# Patient Record
Sex: Male | Born: 1965
Health system: Southern US, Community
[De-identification: ages and names within clinical notes are randomized; demographics above are authoritative.]

## PROBLEM LIST (undated history)

## (undated) DIAGNOSIS — K648 Other hemorrhoids: Secondary | ICD-10-CM

## (undated) DIAGNOSIS — R569 Unspecified convulsions: Secondary | ICD-10-CM

## (undated) DIAGNOSIS — I10 Essential (primary) hypertension: Secondary | ICD-10-CM

## (undated) DIAGNOSIS — I219 Acute myocardial infarction, unspecified: Secondary | ICD-10-CM

## (undated) DIAGNOSIS — Z8619 Personal history of other infectious and parasitic diseases: Secondary | ICD-10-CM

## (undated) DIAGNOSIS — C966 Unifocal Langerhans-cell histiocytosis: Secondary | ICD-10-CM

## (undated) DIAGNOSIS — I255 Ischemic cardiomyopathy: Secondary | ICD-10-CM

## (undated) DIAGNOSIS — K529 Noninfective gastroenteritis and colitis, unspecified: Secondary | ICD-10-CM

## (undated) DIAGNOSIS — E785 Hyperlipidemia, unspecified: Secondary | ICD-10-CM

## (undated) DIAGNOSIS — K5090 Crohn's disease, unspecified, without complications: Secondary | ICD-10-CM

## (undated) DIAGNOSIS — N281 Cyst of kidney, acquired: Secondary | ICD-10-CM

## (undated) DIAGNOSIS — I2510 Atherosclerotic heart disease of native coronary artery without angina pectoris: Secondary | ICD-10-CM

## (undated) DIAGNOSIS — I236 Thrombosis of atrium, auricular appendage, and ventricle as current complications following acute myocardial infarction: Secondary | ICD-10-CM

## (undated) DIAGNOSIS — T7840XA Allergy, unspecified, initial encounter: Secondary | ICD-10-CM

## (undated) DIAGNOSIS — I5022 Chronic systolic (congestive) heart failure: Secondary | ICD-10-CM

## (undated) DIAGNOSIS — M810 Age-related osteoporosis without current pathological fracture: Secondary | ICD-10-CM

## (undated) DIAGNOSIS — Z87898 Personal history of other specified conditions: Secondary | ICD-10-CM

## (undated) DIAGNOSIS — N400 Enlarged prostate without lower urinary tract symptoms: Secondary | ICD-10-CM

## (undated) DIAGNOSIS — K5790 Diverticulosis of intestine, part unspecified, without perforation or abscess without bleeding: Secondary | ICD-10-CM

## (undated) DIAGNOSIS — Z8719 Personal history of other diseases of the digestive system: Secondary | ICD-10-CM

## (undated) DIAGNOSIS — K635 Polyp of colon: Secondary | ICD-10-CM

## (undated) DIAGNOSIS — K922 Gastrointestinal hemorrhage, unspecified: Secondary | ICD-10-CM

## (undated) DIAGNOSIS — K219 Gastro-esophageal reflux disease without esophagitis: Secondary | ICD-10-CM

## (undated) DIAGNOSIS — G5620 Lesion of ulnar nerve, unspecified upper limb: Secondary | ICD-10-CM

## (undated) DIAGNOSIS — D3610 Benign neoplasm of peripheral nerves and autonomic nervous system, unspecified: Secondary | ICD-10-CM

## (undated) HISTORY — DX: Hyperlipidemia, unspecified: E78.5

## (undated) HISTORY — PX: CARDIAC CATHETERIZATION: SHX172

## (undated) HISTORY — DX: Age-related osteoporosis without current pathological fracture: M81.0

## (undated) HISTORY — DX: Personal history of other infectious and parasitic diseases: Z86.19

## (undated) HISTORY — DX: Essential (primary) hypertension: I10

## (undated) HISTORY — DX: Thrombosis of atrium, auricular appendage, and ventricle as current complications following acute myocardial infarction: I23.6

## (undated) HISTORY — DX: Crohn's disease, unspecified, without complications: K50.90

## (undated) HISTORY — DX: Ischemic cardiomyopathy: I25.5

## (undated) HISTORY — DX: Polyp of colon: K63.5

## (undated) HISTORY — DX: Gastro-esophageal reflux disease without esophagitis: K21.9

## (undated) HISTORY — DX: Benign prostatic hyperplasia without lower urinary tract symptoms: N40.0

## (undated) HISTORY — DX: Gastrointestinal hemorrhage, unspecified: K92.2

## (undated) HISTORY — DX: Diverticulosis of intestine, part unspecified, without perforation or abscess without bleeding: K57.90

## (undated) HISTORY — DX: Chronic systolic (congestive) heart failure: I50.22

## (undated) HISTORY — DX: Personal history of other specified conditions: Z87.898

## (undated) HISTORY — DX: Personal history of other diseases of the digestive system: Z87.19

## (undated) HISTORY — PX: UPPER GASTROINTESTINAL ENDOSCOPY: SHX188

## (undated) HISTORY — DX: Lesion of ulnar nerve, unspecified upper limb: G56.20

## (undated) HISTORY — DX: Other hemorrhoids: K64.8

## (undated) HISTORY — DX: Unifocal Langerhans-cell histiocytosis: C96.6

## (undated) HISTORY — DX: Allergy, unspecified, initial encounter: T78.40XA

## (undated) HISTORY — DX: Unspecified convulsions: R56.9

## (undated) HISTORY — DX: Atherosclerotic heart disease of native coronary artery without angina pectoris: I25.10

## (undated) HISTORY — PX: OTHER SURGICAL HISTORY: SHX169

## (undated) HISTORY — DX: Benign neoplasm of peripheral nerves and autonomic nervous system, unspecified: D36.10

## (undated) HISTORY — DX: Noninfective gastroenteritis and colitis, unspecified: K52.9

---
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
## 1990-03-31 DIAGNOSIS — K5090 Crohn's disease, unspecified, without complications: Secondary | ICD-10-CM

## 1990-03-31 HISTORY — DX: Crohn's disease, unspecified, without complications: K50.90

## 1993-03-31 DIAGNOSIS — Z87898 Personal history of other specified conditions: Secondary | ICD-10-CM

## 1993-03-31 HISTORY — DX: Personal history of other specified conditions: Z87.898

## 1994-03-31 HISTORY — PX: TONSILLECTOMY: SUR1361

## 1998-03-31 HISTORY — PX: APPENDECTOMY: SHX54

## 1998-03-31 HISTORY — PX: CHOLECYSTECTOMY: SHX55

## 2005-03-31 DIAGNOSIS — D3610 Benign neoplasm of peripheral nerves and autonomic nervous system, unspecified: Secondary | ICD-10-CM

## 2005-03-31 HISTORY — PX: TUMOR REMOVAL: SHX12

## 2005-03-31 HISTORY — DX: Benign neoplasm of peripheral nerves and autonomic nervous system, unspecified: D36.10

## 2009-03-31 HISTORY — PX: BACK SURGERY: SHX140

## 2009-03-31 HISTORY — PX: LUMBAR SPINE SURGERY: SHX701

## 2010-03-31 HISTORY — PX: SPINAL CORD STIMULATOR IMPLANT: SHX2422

## 2014-07-30 HISTORY — PX: ESOPHAGOGASTRODUODENOSCOPY: SHX1529

## 2014-08-30 HISTORY — PX: COLONOSCOPY: SHX174

## 2015-01-30 HISTORY — PX: CARDIOVASCULAR STRESS TEST: SHX262

## 2015-04-01 HISTORY — PX: INGUINAL HERNIA REPAIR: SUR1180

## 2015-11-30 DIAGNOSIS — M810 Age-related osteoporosis without current pathological fracture: Secondary | ICD-10-CM | POA: Insufficient documentation

## 2015-11-30 HISTORY — DX: Age-related osteoporosis without current pathological fracture: M81.0

## 2015-12-19 ENCOUNTER — Ambulatory Visit (INDEPENDENT_AMBULATORY_CARE_PROVIDER_SITE_OTHER): Payer: BLUE CROSS/BLUE SHIELD | Admitting: Family Medicine

## 2015-12-19 ENCOUNTER — Encounter: Payer: Self-pay | Admitting: Family Medicine

## 2015-12-19 VITALS — BP 130/82 | HR 60 | Temp 97.8°F | Ht 66.75 in | Wt 155.2 lb

## 2015-12-19 DIAGNOSIS — D3610 Benign neoplasm of peripheral nerves and autonomic nervous system, unspecified: Secondary | ICD-10-CM

## 2015-12-19 DIAGNOSIS — D721 Eosinophilia: Secondary | ICD-10-CM

## 2015-12-19 DIAGNOSIS — N400 Enlarged prostate without lower urinary tract symptoms: Secondary | ICD-10-CM

## 2015-12-19 DIAGNOSIS — D72119 Hypereosinophilic syndrome (hes), unspecified: Secondary | ICD-10-CM

## 2015-12-19 DIAGNOSIS — I10 Essential (primary) hypertension: Secondary | ICD-10-CM | POA: Insufficient documentation

## 2015-12-19 DIAGNOSIS — K50918 Crohn's disease, unspecified, with other complication: Secondary | ICD-10-CM | POA: Diagnosis not present

## 2015-12-19 DIAGNOSIS — S161XXA Strain of muscle, fascia and tendon at neck level, initial encounter: Secondary | ICD-10-CM

## 2015-12-19 MED ORDER — AMLODIPINE BESYLATE 5 MG PO TABS: 5.0000 mg | ORAL_TABLET | Freq: Every day | ORAL | 3 refills | Status: DC

## 2015-12-19 MED ORDER — TAMSULOSIN HCL 0.4 MG PO CAPS: 0.4000 mg | ORAL_CAPSULE | Freq: Every day | ORAL | 3 refills | Status: DC

## 2015-12-19 MED ORDER — AMLODIPINE BESYLATE 5 MG PO TABS: 5.0000 mg | ORAL_TABLET | Freq: Every day | ORAL | 1 refills | Status: DC

## 2015-12-19 MED ORDER — TAMSULOSIN HCL 0.4 MG PO CAPS: 0.4000 mg | ORAL_CAPSULE | Freq: Every day | ORAL | 1 refills | Status: DC

## 2015-12-19 MED ORDER — TADALAFIL 5 MG PO TABS: 5.0000 mg | ORAL_TABLET | Freq: Every day | ORAL | 3 refills | Status: DC | PRN

## 2015-12-19 MED ORDER — HYDROXYZINE PAMOATE 100 MG PO CAPS: 100.0000 mg | ORAL_CAPSULE | Freq: Every day | ORAL | 3 refills | Status: DC

## 2015-12-19 MED ORDER — HYDROXYZINE PAMOATE 100 MG PO CAPS: 100.0000 mg | ORAL_CAPSULE | Freq: Every day | ORAL | 1 refills | Status: DC

## 2015-12-19 MED ORDER — METHOCARBAMOL 500 MG PO TABS: 500.0000 mg | ORAL_TABLET | Freq: Two times a day (BID) | ORAL | 0 refills | Status: DC | PRN

## 2015-12-19 NOTE — Progress Notes (Signed)
Pre visit review using our clinic review tool, if applicable. No additional management support is needed unless otherwise documented below in the visit note. 

## 2015-12-19 NOTE — Progress Notes (Signed)
BP 130/82 (BP Location: Left Arm, Patient Position: Sitting, Cuff Size: Normal)   Pulse 60   Temp 97.8 F (36.6 C) (Oral)   Ht 5' 6.75" (1.695 m)   Wt 155 lb 4 oz (70.4 kg)   SpO2 97%   BMI 24.50 kg/m    CC: new pt to establish care Subjective:    Patient ID: Jack [name], male    DOB: [birth year], 50 y.o.   MRN: [id number]  HPI: Jack [name] is a 50 y.o. male presenting on 12/19/2015 for Establish Care   New pt to establish. Here with wife Prior saw Dr Dory Larsen PCP in [place].   Crohn's disease pancolitis - controlled on mercaptopurine 69m daily and prednisone 157mdaily. Takes entyvio every 7 wks. [facility] Wants to stay with GI. Humira and remicade have lost effect. Last crohn's flare 01/2015.   H/o crohn's related polyarthritis and pneumonitis s/p bronchoscopy - started on asmanex (pulmonologist Dr MeMurvin Nataln CaNegley Last pneumonia 01/2015. Has had ICU stays for respiratory problems. Oxycodone PRN, rarely takes. More after recent inguinal hernia surgery.  DEXA - done yesterday  BPH - on flomax daily.   Neck pain over last few days - attributed to poor sleep.  H/o schwannoma removal from L axilla with resultant ulnar nerve ligation s/p repair.   Relevant past medical, surgical, family and social history reviewed and updated as indicated. Interim medical history since our last visit reviewed. Allergies and medications reviewed and updated. No current outpatient prescriptions on file prior to visit.   No current facility-administered medications on file prior to visit.     Review of Systems Per HPI unless specifically indicated in ROS section     Objective:    BP 130/82 (BP Location: Left Arm, Patient Position: Sitting, Cuff Size: Normal)   Pulse 60   Temp 97.8 F (36.6 C) (Oral)   Ht 5' 6.75" (1.695 m)   Wt 155 lb 4 oz (70.4 kg)   SpO2 97%   BMI 24.50 kg/m   Wt Readings from Last 3 Encounters:  12/19/15  155 lb 4 oz (70.4 kg)    Physical Exam  Constitutional: He appears well-developed and well-nourished. No distress.  HENT:  Mouth/Throat: Oropharynx is clear and moist. No oropharyngeal exudate.  Eyes: Conjunctivae are normal. Pupils are equal, round, and reactive to light.  Neck: Normal range of motion. Neck supple. No thyromegaly present.  Cardiovascular: Normal rate, regular rhythm, normal heart sounds and intact distal pulses.   No murmur heard. Pulmonary/Chest: Effort normal and breath sounds normal. No respiratory distress. He has no wheezes. He has no rales.  Abdominal: Soft. Bowel sounds are normal. He exhibits no distension and no mass. There is no tenderness. There is no rebound and no guarding.  Musculoskeletal: He exhibits no edema.  FROM at neck.  Tender to palpation R trapezius  Lymphadenopathy:    He has no cervical adenopathy.  Skin: Skin is warm and dry. No rash noted.  Psychiatric: He has a normal mood and affect.  Nursing note and vitals reviewed.  No results found for this or any previous visit.    Assessment & Plan:  Over 45 minutes were spent face-to-face with the patient during this encounter and >50% of that time was spent on counseling and coordination of care. Records from prior PCP and GI requested today.  Problem List Items Addressed This Visit    BPH (benign prostatic hypertrophy)    BPH  with LUTS, stable on flomax daily. Continue.       Relevant Medications   tamsulosin (FLOMAX) 0.4 MG CAPS capsule   Crohn disease (HCC)    On entyvio, mercaptopurine, and prednisone currently on 74m daily.  Appreciate GI care - records requested.  With pulmonary complications - see below. I will refer to pulm locally to establish.  Pt will continue current regimen otherwise.       Relevant Orders   Ambulatory referral to Pulmonology   Essential hypertension - Primary    Chronic, stable. Continue current regimen.       Relevant Medications   amLODipine  (NORVASC) 5 MG tablet   tadalafil (CIALIS) 5 MG tablet   Hypereosinophilic syndrome    Per pulm - will refer to local pulm to establish care. Prednisone dependent. asmanex hasn't really helped.       Relevant Medications   predniSONE (DELTASONE) 1 MG tablet   predniSONE (DELTASONE) 5 MG tablet   mercaptopurine (PURINETHOL) 50 MG tablet   Other Relevant Orders   Ambulatory referral to Pulmonology   Neck strain    Treat with muscle relaxant sent to pharmacy, heating pad, gentle stretching.      Schwannoma    Other Visit Diagnoses   None.      Follow up plan: Return in about 3 months (around 03/19/2016) for annual exam, prior fasting for blood work.  JRia Bush MD

## 2015-12-19 NOTE — Patient Instructions (Addendum)
meds refilled today. cialis coupon provided today. Try robaxin muscle relaxant for neck, do gentle stretches, heating pad to neck.  Good to meet you. Return as needed or in 3 months for physical.

## 2015-12-20 DIAGNOSIS — D721 Eosinophilia: Secondary | ICD-10-CM

## 2015-12-20 DIAGNOSIS — D72119 Hypereosinophilic syndrome (hes), unspecified: Secondary | ICD-10-CM | POA: Insufficient documentation

## 2015-12-20 DIAGNOSIS — S161XXA Strain of muscle, fascia and tendon at neck level, initial encounter: Secondary | ICD-10-CM | POA: Insufficient documentation

## 2015-12-20 NOTE — Assessment & Plan Note (Signed)
Chronic, stable. Continue current regimen. 

## 2015-12-20 NOTE — Assessment & Plan Note (Signed)
On entyvio, mercaptopurine, and prednisone currently on 90m daily.  Appreciate GI care - records requested.  With pulmonary complications - see below. I will refer to pulm locally to establish.  Pt will continue current regimen otherwise.

## 2015-12-20 NOTE — Assessment & Plan Note (Signed)
BPH with LUTS, stable on flomax daily. Continue.

## 2015-12-20 NOTE — Assessment & Plan Note (Signed)
Per pulm - will refer to local pulm to establish care. Prednisone dependent. asmanex hasn't really helped.

## 2015-12-20 NOTE — Assessment & Plan Note (Signed)
Treat with muscle relaxant sent to pharmacy, heating pad, gentle stretching.

## 2016-01-01 NOTE — Progress Notes (Signed)
[facility] Consultation      Assessment and Plan:  Chronic eosinophilic pneumonia.  -Will order CXR.  -Patient has been stuck on 15 mg of prednisone for several years, this has been difficult to wean down, he cannot seem to go down below this level.  Acute on chronic dyspnea with acute bronchitis.   -Will increase steroids back to 15 mg daily.  --Notice increasing symptoms and decreased the prednisone, he is currently on a burst, and we will decrease this gradually down to 15 mg daily once again.  Severe persistent asthma.  --Continue inhaled steroid. This was changed to a trial of Symbicort to 2 puffs twice daily, asked to rinse mouth out after use. -We will do a trial of this and see if it helps with his breathing, so he will call us for a prescription.  Allergic asthma with multiple food allergies.  --Numerous allergies with allergic rash noted today --Asked family to to track down allergy testing results done in the past.  --In the meantime will order CBC and RAST to be done once he is down to his baseline level of prednisone. --Will prescribe epinephrine auto-injector.  --Pneumovax once is doing better.   Chrohn's disease.  --Currenlty on Entyvio. Has been on humira and remicaide but did not help.  --Managed by [facility] gastroenterologist in [place].   Date: 01/01/2016  MRN# [id number] Jack [name] 20-Apr-1965  Referring Physician:   SHOGO LARKEY is a 50 y.o. old male seen in consultation for chief complaint of:    Chief Complaint  Patient presents with  . PULMONARY CONSULT    per Dr. Garlon Hatchet. pt states he had hx of recurrent PNA. pt currently has non prod cough, increased sob, temp of 101 last night, chills, sweats & chest discomfort X1d.    HPI:   The patient is a 50 yo male with a history of Crohn's disease pancolitis - controlled on mercaptopurine 41m daily and prednisone 179mdaily. Takes entyvio every 7 wks. [place] [facility] [place] Wants to stay with GI. Humira and remicade have lost effect. Last crohn's flare 01/2015. Notes that his crohn's disease predominantly affects his lungs more than intestine.   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   He has been maintained on 15 mg daily and has trouble going below this, when he tries to go to 14 he develops feeling of dyspnea, pulmonary congestion, he has severe itching, and takes hydroxyzine, he has seen an allergist was "super-allergic" to everything.   On review of the patient's history; first seen by Dr HaNevada Cranet [place] in 02/2013 for recurrent multifocal pneumonitis thought to be eosinophilic pneumonia vs other inflammatory pneumonitis, including cryptogenic organizing pneumonia.  Bronchoscopy negative in the past, no evidence of eosinophilia on BAL.  Prednisone tapering attempted on multiple occasions with Dr HaNevada Cranend he would have recurrent respiratory symptoms.  he has pneumonitis s/p bronchoscopy - started on asmanex (pulmonologist Dr MeMurvin Nataln CaSerena Last pneumonia 01/2015. Has had ICU stays for respiratory problems. Bilateral pneumonitis thought to be secondary to eosinophilic pneumonia on chronic prednisone therapy--SOB and cough related to possible hypereosinophilic syndrome.  Pulmonary Function Tests: Date: FVC (% Pred) FEV1 (% Pred) FEV1/FVC TLC RV DLCO Impression  02/08/15 4.46 or 96.4% 3.19 or 87.9% 72 or 91.4% 6.08 or 96% 1.61 or 89.4% 22.3 or 74% Normal PFTs   Pertinent Laboratory Data:  Overall appears consistent with hypereosinophilic syndrome.  ANA was 1:160 C3 was low at 83 with a normal C4 and ESR.  His IgE level was 2550 in 11/10 and 3788 on 12/17/10  Approximately the following year, he did have a bone marrow biopsy performed at [facility] and he reports that the results were normal. Flow cytometry was sent and reportedly noted an overproduction  of eosinophils (46%) but with normal appearing other cell lines.    Pertinent Imaging Data: Overall appears consistent with resolved abnormality in 2016 CT imaging from hospitalization at [facility] in September 2016 1. Negative CT pulmonary angiogram. 2. There is no acute cardiopulmonary disease suspected. 3. Interval resolution of bilateral airspace consolidations seen previously. Small hypodense lesions seen in the liver measuring up to 1.3 cm are likely benign hepatic cysts or hemangiomas.       PMHX:   Past Medical History:  Diagnosis Date  . BPH (benign prostatic hypertrophy)   . Crohn disease ([place]) 1992  . Essential hypertension   . History of chicken pox   . History of gastroesophageal reflux (GERD)   . History of seizure 1995   grand mal x1, completed 6 yrs seizure medication. no seizures since  . Schwannoma 2007   L axilla s/p surgery   Surgical Hx:  Past Surgical History:  Procedure Laterality Date  . APPENDECTOMY  2000  . CHOLECYSTECTOMY  2000  . COLONOSCOPY  01/2015  . extremity surgery Left    ulnar nerve repair after schwannoma removal  . INGUINAL HERNIA REPAIR Bilateral 2017  . LUMBAR SPINE SURGERY  2011  . SPINAL CORD STIMULATOR IMPLANT  2012   x2 (Dr Berton Mount)  . TONSILLECTOMY  1996  . TUMOR REMOVAL Left 2007   schwannoma from L armpit   Family Hx:  Family History  Problem Relation Age of Onset  . CAD Mother     CABG  . Congenital heart disease Mother   . Hypertension Mother   . Hyperlipidemia Mother   . CAD Father     CABG  . Hypertension Father   . Hyperlipidemia Father   . Cancer Maternal Grandmother     colon  . Diabetes Neg Hx    Social Hx:   Social History  Substance Use Topics  . Smoking status: Never Smoker  . Smokeless tobacco: Never Used  . Alcohol use Yes     Comment: 1 beer/day   Medication:     Reviewed.  Included vedolizumab Weyman Rodney)  Allergies:  Cephalexin; Duloxetine; and Aspirin  Review of Systems: Gen:  Denies   fever, sweats, chills HEENT: Denies blurred vision, double vision. bleeds, sore throat Cvc:  No dizziness, chest pain. Resp:   Denies cough or sputum production. Gi: Denies swallowing difficulty, stomach pain. Other:  All other systems were reviewed with the patient and were negative other that what is mentioned in the HPI.   Physical Examination:   VS: BP 128/72 (BP Location: Left Arm, Cuff Size: Normal)   Pulse 88   Ht 5' 7"  (1.702 m)   Wt 153 lb (69.4 kg)   SpO2 97%   BMI 23.96 kg/m   General Appearance: No distress .-The patient has a diffuse fine petechial rash on his torso. Neuro:without focal findings,  speech normal,  HEENT: PERRLA, EOM intact.   Pulmonary: normal breath sounds, No wheezing.  CardiovascularNormal S1,S2.  No m/r/g.   Abdomen: Benign, Soft, non-tender. Renal:  No costovertebral tenderness  GU:  No performed at this time. Endoc: No evident  thyromegaly, no signs of acromegaly. Skin:   warm, no rashes, no ecchymosis  Extremities: normal, no cyanosis, clubbing.  Other findings:    LABORATORY PANEL:   CBC No results for input(s): WBC, HGB, HCT, PLT in the last 168 hours. ------------------------------------------------------------------------------------------------------------------  Chemistries  No results for input(s): NA, K, CL, CO2, GLUCOSE, BUN, CREATININE, CALCIUM, MG, AST, ALT, ALKPHOS, BILITOT in the last 168 hours.  Invalid input(s): GFRCGP ------------------------------------------------------------------------------------------------------------------  Cardiac Enzymes No results for input(s): TROPONINI in the last 168 hours. ------------------------------------------------------------  RADIOLOGY:  No results found.     Thank  you for the consultation and for allowing [facility] to assist in the care of your patient. Our recommendations are noted above.  Please contact us if we can be of further  service.   Marda Stalker, MD.  Board Certified in Internal Medicine, Pulmonary Medicine, [place], and Sleep Medicine.  [facility] Office Number: [phone]  Patricia Pesa, M.D.  Vilinda Boehringer, M.D.  Merton Border, M.D  01/01/2016

## 2016-01-02 ENCOUNTER — Ambulatory Visit
Admission: RE | Admit: 2016-01-02 | Discharge: 2016-01-02 | Disposition: A | Discharge status: Home / Self Care | Payer: BLUE CROSS/BLUE SHIELD | Source: Ambulatory Visit | Attending: Internal Medicine | Admitting: Internal Medicine

## 2016-01-02 ENCOUNTER — Encounter: Payer: Self-pay | Admitting: Internal Medicine

## 2016-01-02 ENCOUNTER — Ambulatory Visit (INDEPENDENT_AMBULATORY_CARE_PROVIDER_SITE_OTHER): Payer: BLUE CROSS/BLUE SHIELD | Admitting: Internal Medicine

## 2016-01-02 VITALS — BP 128/72 | HR 88 | Ht 67.0 in | Wt 153.0 lb

## 2016-01-02 DIAGNOSIS — Z8701 Personal history of pneumonia (recurrent): Secondary | ICD-10-CM | POA: Insufficient documentation

## 2016-01-02 DIAGNOSIS — R918 Other nonspecific abnormal finding of lung field: Secondary | ICD-10-CM | POA: Diagnosis not present

## 2016-01-02 DIAGNOSIS — J189 Pneumonia, unspecified organism: Secondary | ICD-10-CM

## 2016-01-02 DIAGNOSIS — J45909 Unspecified asthma, uncomplicated: Secondary | ICD-10-CM

## 2016-01-02 MED ORDER — EPINEPHRINE 0.3 MG/0.3ML IJ SOAJ: 0.3000 mg | Freq: Once | INTRAMUSCULAR | 1 refills | Status: AC

## 2016-01-02 MED ORDER — BUDESONIDE-FORMOTEROL FUMARATE 160-4.5 MCG/ACT IN AERO: 2.0000 | INHALATION_SPRAY | Freq: Two times a day (BID) | RESPIRATORY_TRACT | 0 refills | Status: DC

## 2016-01-02 NOTE — Patient Instructions (Addendum)
--  Will order CXR, 2 view.  --Start symbicort 160 inhaler 2 puffs twice daily, will give sample, if it helps will prescibe. Rinse mouth after each use.   --Epinephrine auto-injector.  --continue prednisone taper to baseline level of 15 daily.   --once down to 15 mg prednisone daily, check CBC with differential and RAST panel.   --Will need to obtain results from previous allergy testing.

## 2016-01-03 ENCOUNTER — Telehealth: Payer: Self-pay | Admitting: Internal Medicine

## 2016-01-03 NOTE — Telephone Encounter (Signed)
Spoke with pharmacy and they state it didn't come through due to an issue with their system. Asked pharmacist to fax it to [phone] and I will get started on it ASAP.

## 2016-01-03 NOTE — Telephone Encounter (Signed)
Pharmacy calling to see the status on the Epipen PA that they sent over for [place] to do Please call back to let them know the status

## 2016-01-04 MED ORDER — EPINEPHRINE 0.3 MG/0.3ML IJ SOAJ: 0.3000 mg | Freq: Once | INTRAMUSCULAR | 1 refills | Status: AC

## 2016-01-04 NOTE — Addendum Note (Signed)
Addended by: Oscar La R on: 01/04/2016 10:50 AM   Modules accepted: Orders

## 2016-01-04 NOTE — Telephone Encounter (Signed)
Went to [facility] and it stated that EPipen needed to be generic and not brand. Called pharmacy and they state it goes through as generic and that they need new rx. New rx sent. Nothing further needed.

## 2016-01-04 NOTE — Telephone Encounter (Signed)
Submitted PA thru [facility] for Epipen. Key: [id number].  PT ID: [id number]  CMM states they will send questions shortly.  [facility] [place] (Phone) [phone] (Fax)

## 2016-01-23 NOTE — Progress Notes (Addendum)
[facility] Consultation      Assessment and Plan:  Chronic eosinophilic pneumonia.  -Patient has been stuck on 15 mg of prednisone for several years, this has been difficult to wean down, he cannot seem to go down below this level.  Acute on chronic dyspnea with acute bronchitis.   -Will increase steroids back to 15 mg daily.  --Notice increasing symptoms and decreased the prednisone, he is currently on a burst, and we will decrease this gradually down to 15 mg daily once again.  Severe persistent asthma.  --Continue inhaled steroid. This was changed to a trial of Symbicort to 2 puffs twice daily, asked to rinse mouth out after use. -We will do a trial of this and see if it helps with his breathing, so he will call us for a prescription.  Allergic asthma with multiple food allergies.  --Numerous allergies with allergic rash noted today --Asked family to to track down allergy testing results done in the past.  --In the meantime will order CBC and RAST to be done once he is down to his baseline level of prednisone. --Will prescribe epinephrine auto-injector.  --Pneumovax once is doing better.   Chrohn's disease.  --Currenlty on Entyvio. Has been on humira and remicaide but did not help.  --Managed by [facility] gastroenterologist in [place].  --Review of Entyvio package insert:  "The most common adverse reactions (reported by =3% of patients treated with ENTYVIO in the UC Trials I and II and CD Trials I and III combined group and =1% higher than in combined placebo group) were nasopharyngitis, headache, arthralgia, nausea, pyrexia, upper respiratory tract infection, fatigue, cough, bronchitis, influenza, back pain, rash, pruritus, sinusitis, oropharyngeal pain"  Date: 01/23/2016  MRN# [id number] ARDELL MAKAREWICZ 02/22/1966  Referring Physician:   Maurine Cane is a 50 y.o. old male seen in consultation for chief complaint of:    Chief Complaint  Patient  presents with  . Follow-up    4wk rov. CXR 01-02-16 pt currently taking 56m prednisone daily. pt states breathing and allergies are doing well.     HPI:   The patient is a 50yo male with a history of Crohn's disease pancolitis Takes entyvio every 7 wks. [place] Last crohn's flare 01/2015. Notes that his crohn's disease predominantly affects his lungs more than intestine.  CXR 01/02/16; mid-zone scarring bilaterally, some bronchiectasis.   At last visit he was noted to have history of possible eosinophilic pneumonia vs. Other; severe allergic asthma. He has been "stuck" on 15 mg of prednisone for several years, experiencing pulmonary exacerbations whenever this was dropped. Symptoms at that time include a feeling of dyspnea, pulmonary congestion, cough, sneezing, and he has severe itching. He is now at 20 mg daily. He had stopped the symbicort because he was not sure if it was doing.   On review of the patient's history; first [place] for recurrent multifocal pneumonitis thought to be eosinophilic pneumonia vs other inflammatory pneumonitis, including cryptogenic organizing pneumonia.  Bronchoscopy 06/09/2012: negative, no evidence of eosinophilia on BAL and he improved on prednisone   Pulmonary Function Tests: FEV1/FVC TLC RV DLCO Impression  02/08/15:  FVC (% Pred) 4.46 or 96.4%  FEV1 (% Pred)  3.19 or 87.9% 72 or 91.4% 6.08 or 96% 1.61 or 89.4% DLCO-- 22.3 or 74% Normal PFTs   Pertinent Laboratory Data: Overall appears consistent with hypereosinophilic syndrome.  ANA was 1:160 C3  was low at 83 with a normal C4 and ESR.  His IgE level was 2550 in 11/10 and 3788 on 12/17/10  Approximately the following year, he did have a bone marrow biopsy performed at [facility] and he reports that the results were normal. Flow cytometry was sent and reportedly noted an overproduction of eosinophils (46%) but with normal  appearing other cell lines.    Pertinent Imaging Data: Overall appears consistent with resolved abnormality in 2016 CT imaging from hospitalization at [facility] in September 2016 1. Negative CT pulmonary angiogram. 2. There is no acute cardiopulmonary disease suspected. 3. Interval resolution of bilateral airspace consolidations seen previously. Small hypodense lesions seen in the liver measuring up to 1.3 cm are likely benign hepatic cysts or hemangiomas.   Medication:   Reviewed.  Include vedolizumab Weyman Rodney)  Allergies:  Cephalexin; Duloxetine; and Aspirin  Review of Systems: Gen:  Denies  fever, sweats, chills HEENT: Denies blurred vision, double vision. bleeds, sore throat Cvc:  No dizziness, chest pain. Resp:   Denies cough or sputum production. Gi: Denies swallowing difficulty, stomach pain. Other:  All other systems were reviewed with the patient and were negative other that what is mentioned in the HPI.   Physical Examination:   VS: BP 126/68 (BP Location: Left Arm, Cuff Size: Normal)   Pulse 69   Ht _0  (1.702 m)   Wt 162 lb (73.5 kg)   SpO2 93%   BMI 25.37 kg/m   General Appearance: No distress .-The patient has a diffuse fine petechial rash on his torso. Neuro:without focal findings,  speech normal,  HEENT: PERRLA, EOM intact.   Pulmonary: normal breath sounds, No wheezing.  CardiovascularNormal S1,S2.  No m/r/g.   Abdomen: Benign, Soft, non-tender. Renal:  No costovertebral tenderness  GU:  No performed at this time. Endoc: No evident thyromegaly, no signs of acromegaly. Skin:   warm, no rashes, no ecchymosis  Extremities: normal, no cyanosis, clubbing.  Other findings:    LABORATORY PANEL:   CBC No results for input(s): WBC, HGB, HCT, PLT in the last 168 hours. ------------------------------------------------------------------------------------------------------------------  Chemistries  No results for input(s): NA, K, CL, CO2, GLUCOSE, BUN,  CREATININE, CALCIUM, MG, AST, ALT, ALKPHOS, BILITOT in the last 168 hours.  Invalid input(s): GFRCGP ------------------------------------------------------------------------------------------------------------------  Cardiac Enzymes No results for input(s): TROPONINI in the last 168 hours. ------------------------------------------------------------  RADIOLOGY:  No results found.     Thank  you for the consultation and for allowing [facility] to assist in the care of your patient. Our recommendations are noted above.  Please contact us if we can be of further service.   Marda Stalker, MD.  Board Certified in Internal Medicine, Pulmonary Medicine, [place], and Sleep Medicine.  [facility] Office Number: [phone]  Patricia Pesa, M.D.  Vilinda Boehringer, M.D.  Merton Border, M.D  01/23/2016

## 2016-02-01 ENCOUNTER — Ambulatory Visit (INDEPENDENT_AMBULATORY_CARE_PROVIDER_SITE_OTHER): Payer: BLUE CROSS/BLUE SHIELD | Admitting: Internal Medicine

## 2016-02-01 ENCOUNTER — Encounter: Payer: Self-pay | Admitting: Internal Medicine

## 2016-02-01 ENCOUNTER — Other Ambulatory Visit
Admission: RE | Admit: 2016-02-01 | Discharge: 2016-02-01 | Disposition: A | Discharge status: Home / Self Care | Payer: BLUE CROSS/BLUE SHIELD | Source: Ambulatory Visit | Attending: Internal Medicine | Admitting: Internal Medicine

## 2016-02-01 VITALS — BP 126/68 | HR 69 | Ht 67.0 in | Wt 162.0 lb

## 2016-02-01 DIAGNOSIS — J189 Pneumonia, unspecified organism: Secondary | ICD-10-CM | POA: Diagnosis not present

## 2016-02-01 DIAGNOSIS — J82 Pulmonary eosinophilia, not elsewhere classified: Secondary | ICD-10-CM | POA: Diagnosis not present

## 2016-02-01 DIAGNOSIS — J8281 Chronic eosinophilic pneumonia: Secondary | ICD-10-CM

## 2016-02-01 DIAGNOSIS — J45909 Unspecified asthma, uncomplicated: Secondary | ICD-10-CM

## 2016-02-01 MED ORDER — PREDNISONE 10 MG PO TABS: ORAL_TABLET | ORAL | 3 refills | Status: DC

## 2016-02-01 MED ORDER — BUDESONIDE-FORMOTEROL FUMARATE 160-4.5 MCG/ACT IN AERO: 2.0000 | INHALATION_SPRAY | Freq: Two times a day (BID) | RESPIRATORY_TRACT | 3 refills | Status: DC

## 2016-02-01 NOTE — Addendum Note (Signed)
Addended by: Maryanna Shape A on: 02/01/2016 09:55 AM   Modules accepted: Orders

## 2016-02-01 NOTE — Addendum Note (Signed)
Addended by: Maryanna Shape A on: 02/01/2016 10:07 AM   Modules accepted: Orders

## 2016-02-01 NOTE — Addendum Note (Signed)
Addended by: Maryanna Shape A on: 02/01/2016 09:46 AM   Modules accepted: Orders

## 2016-02-01 NOTE — Patient Instructions (Addendum)
--  Will perform CT chest, hi-resolution.   --Prednisone 10 mg tabs 60 tabs for (one months supply), 3 refills.   --serum Immuglobin levels.   --Try NOT to use prednisone bursts due to coughing or sneezing, but rather only for dypsnea.   --Restart symbicort.

## 2016-02-01 NOTE — Addendum Note (Signed)
Addended by: Maryanna Shape A on: 02/01/2016 09:59 AM   Modules accepted: Orders

## 2016-02-01 NOTE — Addendum Note (Signed)
Addended by: Maryanna Shape A on: 02/01/2016 09:49 AM   Modules accepted: Orders

## 2016-02-04 ENCOUNTER — Other Ambulatory Visit
Admission: RE | Admit: 2016-02-04 | Discharge: 2016-02-04 | Disposition: A | Discharge status: Home / Self Care | Payer: BLUE CROSS/BLUE SHIELD | Source: Ambulatory Visit | Attending: Internal Medicine | Admitting: Internal Medicine

## 2016-02-04 DIAGNOSIS — J189 Pneumonia, unspecified organism: Secondary | ICD-10-CM | POA: Insufficient documentation

## 2016-02-04 DIAGNOSIS — J45909 Unspecified asthma, uncomplicated: Secondary | ICD-10-CM | POA: Diagnosis not present

## 2016-02-04 LAB — CBC WITH DIFFERENTIAL/PLATELET
BASOS PCT: 1 %
Basophils Absolute: 0.1 10*3/uL (ref 0–0.1)
Eosinophils Absolute: 0.7 10*3/uL (ref 0–0.7)
Eosinophils Relative: 6 %
HEMATOCRIT: 42.1 % (ref 40.0–52.0)
HEMOGLOBIN: 14 g/dL (ref 13.0–18.0)
LYMPHS ABS: 3 10*3/uL (ref 1.0–3.6)
LYMPHS PCT: 27 %
MCH: 30.2 pg (ref 26.0–34.0)
MCHC: 33.3 g/dL (ref 32.0–36.0)
MCV: 90.8 fL (ref 80.0–100.0)
MONO ABS: 1.1 10*3/uL — AB (ref 0.2–1.0)
MONOS PCT: 10 %
NEUTROS ABS: 6.2 10*3/uL (ref 1.4–6.5)
NEUTROS PCT: 56 %
Platelets: 238 10*3/uL (ref 150–440)
RBC: 4.63 MIL/uL (ref 4.40–5.90)
RDW: 15.7 % — ABNORMAL HIGH (ref 11.5–14.5)
WBC: 11.1 10*3/uL — ABNORMAL HIGH (ref 3.8–10.6)

## 2016-02-05 LAB — MISC LABCORP TEST (SEND OUT)
LABCORP TEST CODE: 602628
Labcorp test code: 209601

## 2016-02-09 ENCOUNTER — Other Ambulatory Visit: Payer: Self-pay | Admitting: Family Medicine

## 2016-02-11 ENCOUNTER — Ambulatory Visit
Admission: RE | Admit: 2016-02-11 | Discharge: 2016-02-11 | Disposition: A | Discharge status: Home / Self Care | Payer: BLUE CROSS/BLUE SHIELD | Source: Ambulatory Visit | Attending: Internal Medicine | Admitting: Internal Medicine

## 2016-02-11 DIAGNOSIS — I700 Atherosclerosis of aorta: Secondary | ICD-10-CM | POA: Insufficient documentation

## 2016-02-11 DIAGNOSIS — I2510 Atherosclerotic heart disease of native coronary artery without angina pectoris: Secondary | ICD-10-CM | POA: Insufficient documentation

## 2016-02-11 DIAGNOSIS — J189 Pneumonia, unspecified organism: Secondary | ICD-10-CM | POA: Diagnosis not present

## 2016-03-02 ENCOUNTER — Encounter: Payer: Self-pay | Admitting: Family Medicine

## 2016-03-02 DIAGNOSIS — K200 Eosinophilic esophagitis: Secondary | ICD-10-CM | POA: Insufficient documentation

## 2016-03-02 DIAGNOSIS — Z87898 Personal history of other specified conditions: Secondary | ICD-10-CM | POA: Insufficient documentation

## 2016-03-18 ENCOUNTER — Encounter: Payer: Self-pay | Admitting: Family Medicine

## 2016-03-18 ENCOUNTER — Ambulatory Visit (INDEPENDENT_AMBULATORY_CARE_PROVIDER_SITE_OTHER): Payer: BLUE CROSS/BLUE SHIELD | Admitting: Family Medicine

## 2016-03-18 VITALS — BP 150/90 | HR 75 | Ht 66.5 in | Wt 160.1 lb

## 2016-03-18 DIAGNOSIS — K50918 Crohn's disease, unspecified, with other complication: Secondary | ICD-10-CM

## 2016-03-18 DIAGNOSIS — Z23 Encounter for immunization: Secondary | ICD-10-CM

## 2016-03-18 DIAGNOSIS — I10 Essential (primary) hypertension: Secondary | ICD-10-CM | POA: Diagnosis not present

## 2016-03-18 DIAGNOSIS — M818 Other osteoporosis without current pathological fracture: Secondary | ICD-10-CM

## 2016-03-18 DIAGNOSIS — Z0000 Encounter for general adult medical examination without abnormal findings: Secondary | ICD-10-CM | POA: Diagnosis not present

## 2016-03-18 DIAGNOSIS — D72119 Hypereosinophilic syndrome (hes), unspecified: Secondary | ICD-10-CM

## 2016-03-18 DIAGNOSIS — D721 Eosinophilia: Secondary | ICD-10-CM

## 2016-03-18 LAB — LIPID PANEL
CHOLESTEROL: 194 mg/dL (ref 0–200)
HDL: 52.4 mg/dL (ref >39.00)
LDL CALC: 123 mg/dL — AB (ref 0–99)
NonHDL: 141.21
Total CHOL/HDL Ratio: 4
Triglycerides: 89 mg/dL (ref 0.0–149.0)
VLDL: 17.8 mg/dL (ref 0.0–40.0)

## 2016-03-18 LAB — COMPREHENSIVE METABOLIC PANEL
ALBUMIN: 4.3 g/dL (ref 3.5–5.2)
ALT: 13 U/L (ref 0–53)
AST: 13 U/L (ref 0–37)
Alkaline Phosphatase: 43 U/L (ref 39–117)
BUN: 18 mg/dL (ref 6–23)
CHLORIDE: 103 meq/L (ref 96–112)
CO2: 32 mEq/L (ref 19–32)
Calcium: 9.4 mg/dL (ref 8.4–10.5)
Creatinine, Ser: 1.15 mg/dL (ref 0.40–1.50)
GFR: 71.95 mL/min (ref >60.00)
Glucose, Bld: 100 mg/dL — ABNORMAL HIGH (ref 70–99)
POTASSIUM: 4.9 meq/L (ref 3.5–5.1)
SODIUM: 141 meq/L (ref 135–145)
Total Bilirubin: 0.6 mg/dL (ref 0.2–1.2)
Total Protein: 6.8 g/dL (ref 6.0–8.3)

## 2016-03-18 LAB — TSH: TSH: 1.35 u[IU]/mL (ref 0.35–4.50)

## 2016-03-18 NOTE — Assessment & Plan Note (Addendum)
Appreciate GI and pulm care. Slow taper off prednisone.

## 2016-03-18 NOTE — Assessment & Plan Note (Signed)
Chronic, mildly elevated today. Did not change med regimen at this time.

## 2016-03-18 NOTE — Progress Notes (Signed)
BP (!) 150/90   Pulse 75   Ht 5' 6.5" (1.689 m)   Wt 160 lb 1.9 oz (72.6 kg)   SpO2 98%   BMI 25.46 kg/m    CC: CPE Subjective:    Patient ID: Jack [name], male    DOB: [birth year], 50 y.o.   MRN: [id number]  HPI: Jack [name] is a 50 y.o. male presenting on 03/18/2016 for Annual Exam (symbicort is making him constipated, taking prednisone 63m once daily. stopped the entyvio injections. )   Since seen here, has established with local pulm ([facility]) for chronic eosinophilic pneumonia with persistent allergic asthma, controlled on prednisone 133mdaily. Has been able to decrease prednisone to 1228maily with symbicort use (noticing some constipation, treating with miralax and isoflush). Crohn's disease followed by [facility] on Entyvio - off entyvio.   [place]  Preventative: COLONOSCOPY during hospitalization 01/2015 - active ileitis/colitis, f/u 2 yrs ([facility] cancer screening - not due yet  DEXA scan - 11/2015 T -2.9 Osteoporosis. Takes calcium/vit D. Flu shot yearly Tetanus shot - unsure Pneumonia shot - pneumovax today Shingles shot - not due yet Seat belt use discussed Sunscreen use discussed. No changing moles on skin Non smoker Alcohol - 1 beer/day  Lives with wife, 1 dog. Grown children  Edu: college  Occ: owns mattress store  Activity: active at work, prior croLiberty [employer]ants to restart running  Diet: good water, fruits/vegetables daily   Relevant past medical, surgical, family and social history reviewed and updated as indicated. Interim medical history since our last visit reviewed. Allergies and medications reviewed and updated. Current Outpatient Prescriptions on File Prior to Visit  Medication Sig  . amLODipine (NORVASC) 5 MG tablet Take 1 tablet (5 mg total) by mouth daily.  . budesonide-formoterol (SYMBICORT) 160-4.5 MCG/ACT inhaler Inhale 2 puffs into the lungs 2 (two) times daily.  . Calcium  Carbonate-Vit D-Min (CALCIUM 1200 PO) Take 1 capsule by mouth 2 (two) times daily.  . diphenoxylate-atropine (LOMOTIL) 2.5-0.025 MG/5ML liquid Take by mouth daily as needed for diarrhea or loose stools.  . famotidine (PEPCID) 20 MG tablet Take 20 mg by mouth 2 (two) times daily.  . hydrOXYzine (VISTARIL) 100 MG capsule Take 1 capsule (100 mg total) by mouth daily.  . mercaptopurine (PURINETHOL) 50 MG tablet Take 50 mg by mouth daily. Give on an empty stomach 1 hour before or 2 hours after meals. Caution: Chemotherapy.  . methocarbamol (ROBAXIN) 500 MG tablet Take 1 tablet (500 mg total) by mouth 2 (two) times daily as needed for muscle spasms (sedation precautions).  . Multiple Vitamin (MULTIVITAMIN) capsule Take 1 capsule by mouth daily.  . oMarland KitchenyCODONE-acetaminophen (PERCOCET/ROXICET) 5-325 MG tablet Take by mouth every 4 (four) hours as needed for severe pain.  . predniSONE (DELTASONE) 10 MG tablet Take 2 tablets daily. (Patient taking differently: Take 12 mg by mouth daily with breakfast. Take 2 tablets daily.)  . tadalafil (CIALIS) 5 MG tablet Take 1 tablet (5 mg total) by mouth daily as needed for erectile dysfunction.  . tamsulosin (FLOMAX) 0.4 MG CAPS capsule TAKE 1 CAPSULE (0.4 MG TOTAL) BY MOUTH DAILY.  . VMarland Kitchendolizumab (ENTYVIO IV) Inject into the vein. Every 7 weeks   No current facility-administered medications on file prior to visit.     Review of Systems  Constitutional: Negative for activity change, appetite change, chills, fatigue, fever and unexpected weight change.  HENT: Negative for hearing loss.   Eyes: Negative for visual  disturbance.  Respiratory: Negative for cough, chest tightness, shortness of breath and wheezing.   Cardiovascular: Negative for chest pain, palpitations and leg swelling.  Gastrointestinal: Positive for constipation. Negative for abdominal distention, abdominal pain, blood in stool, diarrhea, nausea and vomiting.  Genitourinary: Negative for difficulty  urinating and hematuria.  Musculoskeletal: Negative for arthralgias, myalgias and neck pain.  Skin: Negative for rash.  Neurological: Negative for dizziness, seizures, syncope and headaches.  Hematological: Negative for adenopathy. Does not bruise/bleed easily.  Psychiatric/Behavioral: Negative for dysphoric mood. The patient is not nervous/anxious.    Per HPI unless specifically indicated in ROS section     Objective:    BP (!) 150/90   Pulse 75   Ht 5' 6.5" (1.689 m)   Wt 160 lb 1.9 oz (72.6 kg)   SpO2 98%   BMI 25.46 kg/m   Wt Readings from Last 3 Encounters:  03/18/16 160 lb 1.9 oz (72.6 kg)  02/01/16 162 lb (73.5 kg)  01/02/16 153 lb (69.4 kg)    Physical Exam  Constitutional: He is oriented to person, place, and time. He appears well-developed and well-nourished. No distress.  HENT:  Head: Normocephalic and atraumatic.  Right Ear: Hearing, tympanic membrane, external ear and ear canal normal.  Left Ear: Hearing, tympanic membrane, external ear and ear canal normal.  Nose: Nose normal.  Mouth/Throat: Uvula is midline, oropharynx is clear and moist and mucous membranes are normal. No oropharyngeal exudate, posterior oropharyngeal edema or posterior oropharyngeal erythema.  Eyes: Conjunctivae and EOM are normal. Pupils are equal, round, and reactive to light. No scleral icterus.  Neck: Normal range of motion. Neck supple. No thyromegaly present.  Cardiovascular: Normal rate, regular rhythm, normal heart sounds and intact distal pulses.   No murmur heard. Pulses:      Radial pulses are 2+ on the right side, and 2+ on the left side.  Pulmonary/Chest: Effort normal and breath sounds normal. No respiratory distress. He has no wheezes. He has no rales.  Abdominal: Soft. Bowel sounds are normal. He exhibits no distension and no mass. There is no tenderness. There is no rebound and no guarding.  Musculoskeletal: Normal range of motion. He exhibits no edema.  Lymphadenopathy:     He has no cervical adenopathy.  Neurological: He is alert and oriented to person, place, and time.  CN grossly intact, station and gait intact  Skin: Skin is warm and dry. No rash noted.  Psychiatric: He has a normal mood and affect. His behavior is normal. Judgment and thought content normal.  Nursing note and vitals reviewed.  Results for orders placed or performed during the hospital encounter of 02/04/16  CBC with Differential/Platelet  Result Value Ref Range   WBC 11.1 (H) 3.8 - 10.6 K/uL   RBC 4.63 4.40 - 5.90 MIL/uL   Hemoglobin 14.0 13.0 - 18.0 g/dL   HCT 42.1 40.0 - 52.0 %   MCV 90.8 80.0 - 100.0 fL   MCH 30.2 26.0 - 34.0 pg   MCHC 33.3 32.0 - 36.0 g/dL   RDW 15.7 (H) 11.5 - 14.5 %   Platelets 238 150 - 440 K/uL   Neutrophils Relative % 56 %   Neutro Abs 6.2 1.4 - 6.5 K/uL   Lymphocytes Relative 27 %   Lymphs Abs 3.0 1.0 - 3.6 K/uL   Monocytes Relative 10 %   Monocytes Absolute 1.1 (H) 0.2 - 1.0 K/uL   Eosinophils Relative 6 %   Eosinophils Absolute 0.7 0 - 0.7 K/uL  Basophils Relative 1 %   Basophils Absolute 0.1 0 - 0.1 K/uL  Miscellaneous [facility] test (send-out)  Result Value Ref Range   [facility] test code 209,601    [facility] test name IGG 1,2,3,4    Misc [facility] result COMMENT   Miscellaneous [facility] test (send-out)  Result Value Ref Range   [facility] test code [id number]    [facility] test name ALLERGEN PROFILE WITH TOTAL IGE RESP AREA 2    Misc [facility] result COMMENT       Assessment & Plan:   Problem List Items Addressed This Visit    Crohn disease ([place])    Stable period. Off entyvio, continues prednisone and 6MP. Appreciate GI care.       Essential hypertension    Chronic, mildly elevated today. Did not change med regimen at this time.       Relevant Orders   Comprehensive metabolic panel   Lipid panel   TSH   Healthcare maintenance - Primary    Preventative protocols reviewed and updated unless pt declined. Discussed healthy diet and lifestyle.        Hypereosinophilic syndrome    Appreciate GI and pulm care. Slow taper off prednisone.      Osteoporosis    Continues cal/vit D. Reviewed dosing, rec calcium in diet. Discussed bisphosphonate - pt declines for now. Consider recheck DEXA.           Follow up plan: Return in about 1 year (around 03/18/2017) for annual exam, prior fasting for blood work.  [name], MD

## 2016-03-18 NOTE — Assessment & Plan Note (Signed)
Stable period. Off entyvio, continues prednisone and 6MP. Appreciate GI care.

## 2016-03-18 NOTE — Assessment & Plan Note (Addendum)
Continues cal/vit D. Reviewed dosing, rec calcium in diet. Discussed bisphosphonate - pt declines for now. Consider recheck DEXA.

## 2016-03-18 NOTE — Addendum Note (Signed)
Addended by: Inocencio Homes on: 03/18/2016 10:29 AM   Modules accepted: Orders

## 2016-03-18 NOTE — Patient Instructions (Addendum)
Pneumovax today.  Labs today.  Continue calcium and vitamin D (goal calcium is 1253m daily, ideally most from food, goal vit D 1000 units daily). Try to get most or all of your calcium from your food-- To figure out dietary calcium: 300 mg/day from all non dairy foods plus 300 mg per cup of milk, other dairy, or fortified juice. Non dairy foods that contain calcium: Kale, oranges, sardines, oatmeal, soy milk/soybeans, salmon, white beans, dried figs, turnip greens, almonds, broccoli, tofu. RTC 1 yr CPE, sooner if needed  Health Maintenance, Male A healthy lifestyle and preventative care can promote health and wellness.  Maintain regular health, dental, and eye exams.  Eat a healthy diet. Foods like vegetables, fruits, whole grains, low-fat dairy products, and lean protein foods contain the nutrients you need and are low in calories. Decrease your intake of foods high in solid fats, added sugars, and salt. Get information about a proper diet from your health care provider, if necessary.  Regular physical exercise is one of the most important things you can do for your health. Most adults should get at least 150 minutes of moderate-intensity exercise (any activity that increases your heart rate and causes you to sweat) each week. In addition, most adults need muscle-strengthening exercises on 2 or more days a week.   Maintain a healthy weight. The body mass index (BMI) is a screening tool to identify possible weight problems. It provides an estimate of body fat based on height and weight. Your health care provider can find your BMI and can help you achieve or maintain a healthy weight. For males 20 years and older:  A BMI below 18.5 is considered underweight.  A BMI of 18.5 to 24.9 is normal.  A BMI of 25 to 29.9 is considered overweight.  A BMI of 30 and above is considered obese.  Maintain normal blood lipids and cholesterol by exercising and minimizing your intake of saturated fat. Eat  a balanced diet with plenty of fruits and vegetables. Blood tests for lipids and cholesterol should begin at age 53755and be repeated every 5 years. If your lipid or cholesterol levels are high, you are over age 48 or you are at high risk for heart disease, you may need your cholesterol levels checked more frequently.Ongoing high lipid and cholesterol levels should be treated with medicines if diet and exercise are not working.  If you smoke, find out from your health care provider how to quit. If you do not use tobacco, do not start.  Lung cancer screening is recommended for adults aged 575-80years who are at high risk for developing lung cancer because of a history of smoking. A yearly low-dose CT scan of the lungs is recommended for people who have at least a 30-pack-year history of smoking and are current smokers or have quit within the past 15 years. A pack year of smoking is smoking an average of 1 pack of cigarettes a day for 1 year (for example, a 30-pack-year history of smoking could mean smoking 1 pack a day for 30 years or 2 packs a day for 15 years). Yearly screening should continue until the smoker has stopped smoking for at least 15 years. Yearly screening should be stopped for people who develop a health problem that would prevent them from having lung cancer treatment.  If you choose to drink alcohol, do not have more than 2 drinks per day. One drink is considered to be 12 oz (360 mL) of beer, 5  oz (150 mL) of wine, or 1.5 oz (45 mL) of liquor.  Avoid the use of street drugs. Do not share needles with anyone. Ask for help if you need support or instructions about stopping the use of drugs.  High blood pressure causes heart disease and increases the risk of stroke. High blood pressure is more likely to develop in:  People who have blood pressure in the end of the normal range (100-139/85-89 mm Hg).  People who are overweight or obese.  People who are African American.  If you are  11-33 years of age, have your blood pressure checked every 3-5 years. If you are 41 years of age or older, have your blood pressure checked every year. You should have your blood pressure measured twice-once when you are at a hospital or clinic, and once when you are not at a hospital or clinic. Record the average of the two measurements. To check your blood pressure when you are not at a hospital or clinic, you can use:  An automated blood pressure machine at a pharmacy.  A home blood pressure monitor.  If you are 38-34 years old, ask your health care provider if you should take aspirin to prevent heart disease.  Diabetes screening involves taking a blood sample to check your fasting blood sugar level. This should be done once every 3 years after age 57 if you are at a normal weight and without risk factors for diabetes. Testing should be considered at a younger age or be carried out more frequently if you are overweight and have at least 1 risk factor for diabetes.  Colorectal cancer can be detected and often prevented. Most routine colorectal cancer screening begins at the age of 66 and continues through age 91. However, your health care provider may recommend screening at an earlier age if you have risk factors for colon cancer. On a yearly basis, your health care provider may provide home test kits to check for hidden blood in the stool. A small camera at the end of a tube may be used to directly examine the colon (sigmoidoscopy or colonoscopy) to detect the earliest forms of colorectal cancer. Talk to your health care provider about this at age 54 when routine screening begins. A direct exam of the colon should be repeated every 5-10 years through age 38, unless early forms of precancerous polyps or small growths are found.  People who are at an increased risk for hepatitis B should be screened for this virus. You are considered at high risk for hepatitis B if:  You were born in a country where  hepatitis B occurs often. Talk with your health care provider about which countries are considered high risk.  Your parents were born in a high-risk country and you have not received a shot to protect against hepatitis B (hepatitis B vaccine).  You have HIV or AIDS.  You use needles to inject street drugs.  You live with, or have sex with, someone who has hepatitis B.  You are a man who has sex with other men (MSM).  You get hemodialysis treatment.  You take certain medicines for conditions like cancer, organ transplantation, and autoimmune conditions.  Hepatitis C blood testing is recommended for all people born from [birth year] through 1963 and any individual with known risk factors for hepatitis C.  Healthy men should no longer receive prostate-specific antigen (PSA) blood tests as part of routine cancer screening. Talk to your health care provider about prostate cancer screening.  Testicular cancer screening is not recommended for adolescents or adult males who have no symptoms. Screening includes self-exam, a health care provider exam, and other screening tests. Consult with your health care provider about any symptoms you have or any concerns you have about testicular cancer.  Practice safe sex. Use condoms and avoid high-risk sexual practices to reduce the spread of sexually transmitted infections (STIs).  You should be screened for STIs, including gonorrhea and chlamydia if:  You are sexually active and are younger than 24 years.  You are older than 24 years, and your health care provider tells you that you are at risk for this type of infection.  Your sexual activity has changed since you were last screened, and you are at an increased risk for chlamydia or gonorrhea. Ask your health care provider if you are at risk.  If you are at risk of being infected with HIV, it is recommended that you take a prescription medicine daily to prevent HIV infection. This is called pre-exposure  prophylaxis (PrEP). You are considered at risk if:  You are a man who has sex with other men (MSM).  You are a heterosexual man who is sexually active with multiple partners.  You take drugs by injection.  You are sexually active with a partner who has HIV.  Talk with your health care provider about whether you are at high risk of being infected with HIV. If you choose to begin PrEP, you should first be tested for HIV. You should then be tested every 3 months for as long as you are taking PrEP.  Use sunscreen. Apply sunscreen liberally and repeatedly throughout the day. You should seek shade when your shadow is shorter than you. Protect yourself by wearing long sleeves, pants, a wide-brimmed hat, and sunglasses year round whenever you are outdoors.  Tell your health care provider of new moles or changes in moles, especially if there is a change in shape or color. Also, tell your health care provider if a mole is larger than the size of a pencil eraser.  A one-time screening for abdominal aortic aneurysm (AAA) and surgical repair of large AAAs by ultrasound is recommended for men aged 62-75 years who are current or former smokers.  Stay current with your vaccines (immunizations). This information is not intended to replace advice given to you by your health care provider. Make sure you discuss any questions you have with your health care provider. Document Released: 09/13/2007 Document Revised: 04/07/2014 Document Reviewed: 12/19/2014 Elsevier Interactive Patient Education  2017 [employer].

## 2016-03-18 NOTE — Assessment & Plan Note (Signed)
Preventative protocols reviewed and updated unless pt declined. Discussed healthy diet and lifestyle.  

## 2016-03-25 ENCOUNTER — Encounter: Payer: Self-pay | Admitting: *Deleted

## 2016-04-18 ENCOUNTER — Other Ambulatory Visit: Payer: Self-pay | Admitting: Family Medicine

## 2016-06-30 ENCOUNTER — Telehealth: Payer: Self-pay

## 2016-06-30 ENCOUNTER — Encounter: Payer: Self-pay | Admitting: Family Medicine

## 2016-06-30 ENCOUNTER — Ambulatory Visit (INDEPENDENT_AMBULATORY_CARE_PROVIDER_SITE_OTHER): Payer: BLUE CROSS/BLUE SHIELD | Admitting: Family Medicine

## 2016-06-30 ENCOUNTER — Ambulatory Visit: Payer: BLUE CROSS/BLUE SHIELD | Admitting: Family Medicine

## 2016-06-30 ENCOUNTER — Other Ambulatory Visit: Payer: Self-pay | Admitting: Internal Medicine

## 2016-06-30 VITALS — BP 135/75 | HR 71 | Resp 12 | Ht 66.5 in | Wt 165.5 lb

## 2016-06-30 DIAGNOSIS — S29011A Strain of muscle and tendon of front wall of thorax, initial encounter: Secondary | ICD-10-CM

## 2016-06-30 DIAGNOSIS — R0789 Other chest pain: Secondary | ICD-10-CM | POA: Diagnosis not present

## 2016-06-30 MED ORDER — CELECOXIB 100 MG PO CAPS: 100.0000 mg | ORAL_CAPSULE | Freq: Two times a day (BID) | ORAL | 0 refills | Status: AC

## 2016-06-30 MED ORDER — KETOROLAC TROMETHAMINE 60 MG/2ML IM SOLN
60.0000 mg | Freq: Once | INTRAMUSCULAR | Status: AC
  Administered 2016-06-30: 60 mg via INTRAMUSCULAR

## 2016-06-30 NOTE — Patient Instructions (Signed)
  Jack [name] I have seen you today for an acute visit.  A few things to remember from today's visit:   Right-sided chest wall pain - Plan: DG Chest 2 View, celecoxib (CELEBREX) 100 MG capsule  Here in office Toradol given ,do not take Celebrex today,can start tomorrow.   Medications prescribed today are intended for short period of time and will not be refill upon request, a follow up appointment might be necessary to discuss continuation of of treatment if appropriate.     In general please monitor for signs of worsening symptoms and seek immediate medical attention if any concerning.  If symptoms are not resolved in 2-3 weeks you should schedule a follow up appointment with your doctor, before if needed.

## 2016-06-30 NOTE — Progress Notes (Signed)
Pre visit review using our clinic review tool, if applicable. No additional management support is needed unless otherwise documented below in the visit note. 

## 2016-06-30 NOTE — Telephone Encounter (Signed)
Received PA request for Celebrex, PA submitted & approved. Form faxed back to pharmacy.

## 2016-06-30 NOTE — Telephone Encounter (Signed)
Per your last OV note in 01/2016 for prednisone, see below (no OV appt scheduled at this time: please advise if I can refill Prednisone.  Author: Laverle Hobby, MD Author Type: Physician Filed: 02/01/2016 9:31 AM  Note Status: Addendum Cosign: Cosign Not Required Encounter Date: 02/01/2016  Editor: Laverle Hobby, MD (Physician)  Prior Versions: 1. Laverle Hobby, MD (Physician) at 02/01/2016 9:30 AM - Addendum   2. Laverle Hobby, MD (Physician) at 02/01/2016 9:26 AM - Addendum   3. Laverle Hobby, MD (Physician) at 02/01/2016 9:24 AM - Addendum   4. Laverle Hobby, MD (Physician) at 02/01/2016 9:21 AM - Addendum   5. Laverle Hobby, MD (Physician) at 02/01/2016 9:21 AM - Addendum   6. Laverle Hobby, MD (Physician) at 02/01/2016 9:20 AM - Addendum   7. Laverle Hobby, MD (Physician) at 02/01/2016 9:20 AM - Signed    --Will perform CT chest, hi-resolution.   --Prednisone 10 mg tabs 60 tabs for (one months supply), 3 refills.   --serum Immuglobin levels.   --Try NOT to use prednisone bursts due to coughing or sneezing, but rather only for dypsnea.   --Restart symbicort.

## 2016-06-30 NOTE — Progress Notes (Signed)
HPI:   ACUTE VISIT:  Chief Complaint  Patient presents with  . pulled muscle    [name] is a 51 y.o. male, who is here today complaining of 6-7 days of severe right upper chest pain that started after injury. He was using a big bolt cutter and while holding it up, he felt a "pop" on upper chest. He developed pain right after. Pain is sharp,constant , no radiated,exacerbated by coughing and when reaching with RUE. He did not noted deformity,edema,or ecchymosis. No dyspnea or wheezing.  He took Percocet he had left from old Rx, helped some. Pain is stable overall.   Review of Systems  Constitutional: Negative for appetite change, diaphoresis and fever.  HENT: Negative for sore throat and trouble swallowing.   Respiratory: Negative for cough, shortness of breath and wheezing.   Cardiovascular: Negative for palpitations and leg swelling.  Gastrointestinal: Negative for abdominal pain, nausea and vomiting.  Musculoskeletal: Negative for arthralgias, gait problem and neck pain.  Neurological: Negative for syncope, weakness and headaches.  Hematological: Negative for adenopathy. Does not bruise/bleed easily.  Psychiatric/Behavioral: Negative for confusion. The patient is not nervous/anxious.       Current Outpatient Prescriptions on File Prior to Visit  Medication Sig Dispense Refill  . amLODipine (NORVASC) 5 MG tablet Take 1 tablet (5 mg total) by mouth daily. 90 tablet 3  . budesonide-formoterol (SYMBICORT) 160-4.5 MCG/ACT inhaler Inhale 2 puffs into the lungs 2 (two) times daily. 1 Inhaler 3  . Calcium Carbonate-Vit D-Min (CALCIUM 1200 PO) Take 1 capsule by mouth 2 (two) times daily.    . diphenoxylate-atropine (LOMOTIL) 2.5-0.025 MG/5ML liquid Take by mouth daily as needed for diarrhea or loose stools.    . famotidine (PEPCID) 20 MG tablet Take 20 mg by mouth 2 (two) times daily.    . hydrOXYzine (VISTARIL) 100 MG capsule Take 1 capsule (100 mg total) by  mouth daily. 90 capsule 3  . mercaptopurine (PURINETHOL) 50 MG tablet Take 50 mg by mouth daily. Give on an empty stomach 1 hour before or 2 hours after meals. Caution: Chemotherapy.    . methocarbamol (ROBAXIN) 500 MG tablet Take 1 tablet (500 mg total) by mouth 2 (two) times daily as needed for muscle spasms (sedation precautions). 30 tablet 0  . Multiple Vitamin (MULTIVITAMIN) capsule Take 1 capsule by mouth daily.    [name] oxyCODONE-acetaminophen (PERCOCET/ROXICET) 5-325 MG tablet Take by mouth every 4 (four) hours as needed for severe pain.    . tadalafil (CIALIS) 5 MG tablet Take 1 tablet (5 mg total) by mouth daily as needed for erectile dysfunction. 30 tablet 3  . tamsulosin (FLOMAX) 0.4 MG CAPS capsule TAKE 1 CAPSULE (0.4 MG TOTAL) BY MOUTH DAILY. 30 capsule 3  . Vedolizumab (ENTYVIO IV) Inject into the vein. Every 7 weeks     No current facility-administered medications on file prior to visit.      Past Medical History:  Diagnosis Date  . BPH (benign prostatic hypertrophy)   . Crohn disease ([place]) 1992   history uveitis, involvement of intestines and lungs  . Eosinophilic esophagitis   . Essential hypertension   . History of chicken pox   . History of gastroesophageal reflux (GERD)   . History of seizure 1995   grand mal x1, completed 6 yrs dilantin. no seizures since  . Osteoporosis 11/2015   DEXA T -2.9  . Schwannoma 2007   L axilla s/p surgery  . Ulnar neuropathy  h/o this from L arm schwannoma   Allergies  Allergen Reactions  . Cephalexin Nausea And Vomiting    Other Reaction: GI UPSET  . Duloxetine Other (See Comments)    Headaches   . Remicade [Infliximab]   . Aspirin Hives and Rash    Social History   Social History  . Marital status: Married    Spouse name: N/A  . Number of children: N/A  . Years of education: N/A   Social History Main Topics  . Smoking status: Never Smoker  . Smokeless tobacco: Never Used  . Alcohol use Yes     Comment: 1 beer/day   . Drug use: No  . Sexual activity: Not Asked   Other Topics Concern  . None   Social History Narrative   Lives with wife, 1 dog. Grown children   Edu: college   Occ: owns mattress store   Activity: active at work, started [employer], wants to restart running   Diet: good water, fruits/vegetables daily    Vitals:   06/30/16 1558 06/30/16 1631  BP: 140/84 135/75  Pulse: 71   Resp: 12   O2 sat 98% at RA. Body mass index is 26.31 kg/m.  Physical Exam  Nursing note and vitals reviewed. Constitutional: He is oriented to person, place, and time. He appears well-developed. No distress.  HENT:  Head: Atraumatic.  Mouth/Throat: Oropharynx is clear and moist and mucous membranes are normal.  Eyes: Conjunctivae and EOM are normal.  Cardiovascular: Normal rate and regular rhythm.   No murmur heard. Respiratory: Effort normal and breath sounds normal. No respiratory distress. He exhibits tenderness (with palpation.).  Musculoskeletal: He exhibits no edema.  No deformity appreciated. Pain upon palpation on infrascapular area right side.   Lymphadenopathy:    He has no cervical adenopathy.  Neurological: He is alert and oriented to person, place, and time. He has normal strength. Gait normal.  Skin: Skin is warm. No ecchymosis and no rash noted. No erythema.  Psychiatric: He has a normal mood and affect.  Well groomed,good eye contact.      ASSESSMENT AND PLAN:   Jack [name] was seen today for pulled muscle.  Diagnoses and all orders for this visit:  Right-sided chest wall pain -     DG Chest 2 View; Future -     celecoxib (CELEBREX) 100 MG capsule; Take 1 capsule (100 mg total) by mouth 2 (two) times daily. -     ketorolac (TORADOL) injection 60 mg; Inject 2 mLs (60 mg total) into the muscle once.  Muscle strain of chest wall, initial encounter -     celecoxib (CELEBREX) 100 MG capsule; Take 1 capsule (100 mg total) by mouth 2 (two) times daily. -     ketorolac (TORADOL)  injection 60 mg; Inject 2 mLs (60 mg total) into the muscle once.   Here in the office and after verbal consent Toradol 60 mg IM x 1 was given. Local heat/ice may help. Today CXR was ordered. Tomorrow he can continue Celebrex,some side effects discussed. Hx of HTN,his BP mildly elevated today.Instructed to check BP periodically. Instructed about warning signs. F/U as needed.    -[name] was advised to return or notify a doctor immediately if symptoms worsen or persist or new concerns arise.       Erynne Kealey G. [place], MD  [facility]. [place] office.

## 2016-07-01 ENCOUNTER — Ambulatory Visit (INDEPENDENT_AMBULATORY_CARE_PROVIDER_SITE_OTHER)
Admission: RE | Admit: 2016-07-01 | Discharge: 2016-07-01 | Disposition: A | Discharge status: Home / Self Care | Payer: BLUE CROSS/BLUE SHIELD | Source: Ambulatory Visit | Attending: Family Medicine | Admitting: Family Medicine

## 2016-07-01 DIAGNOSIS — R0789 Other chest pain: Secondary | ICD-10-CM

## 2016-07-28 ENCOUNTER — Telehealth: Payer: Self-pay | Admitting: Family Medicine

## 2016-07-28 ENCOUNTER — Telehealth: Payer: Self-pay | Admitting: *Deleted

## 2016-07-28 MED ORDER — DIPHENOXYLATE-ATROPINE 2.5-0.025 MG PO TABS: 1.0000 | ORAL_TABLET | Freq: Four times a day (QID) | ORAL | 0 refills | Status: DC | PRN

## 2016-07-28 MED ORDER — DIPHENOXYLATE-ATROPINE 2.5-0.025 MG/5ML PO LIQD: 2.5000 mL | Freq: Four times a day (QID) | ORAL | 0 refills | Status: DC | PRN

## 2016-07-28 NOTE — Telephone Encounter (Signed)
We did not receive request from pharmacy. plz check with wife - was this pills or liquid solution? (it was ordered as liquid solution?) May phone in either.

## 2016-07-28 NOTE — Telephone Encounter (Signed)
[facility]  Patient Name: Jack [name]  DOB: 02-12-1966    Initial Comment Caller states her husband has crones disease and is living for a trip. Needs refill.    Nurse Assessment  Nurse: Harlow Mares, RN, Suanne Marker Date/Time (Eastern Time): 07/28/2016 3:54:17 PM  Confirm and document reason for call. If symptomatic, describe symptoms. ---Caller states her husband has crones disease and is living for a trip. Needs refill. This would be a new med for this med. He is leaving to go on a [place] trip for his bday and is leaving today. He takes Lomotil prn. He has been taking 2.5 mg qid prn, wanting to know if the MD would consider calling this in for the patient. She states that the pharmacy should have already contacted the MD office about this. [facility] [place]: [phone]  Does the patient have any new or worsening symptoms? ---No  Please document clinical information provided and list any resource used. ---Lysle Pearl at MD office and was told that nurse will take noted with pharmacy info and request to MD but patient has not been seen since December. Asked that nurse send info to the office. Advised caller of this, voiced understanding.     Guidelines    Guideline Title Affirmed Question Affirmed Notes       Final Disposition User   Clinical Call Harlow Mares, RN, Suanne Marker

## 2016-07-28 NOTE — Telephone Encounter (Signed)
Pt states he prefers pills. Rx called in to requested pharmacy

## 2016-07-28 NOTE — Telephone Encounter (Signed)
A user error has taken place:open by mistake.Marland Kitchenopen by mistake...Jack [name]

## 2016-08-04 ENCOUNTER — Ambulatory Visit (INDEPENDENT_AMBULATORY_CARE_PROVIDER_SITE_OTHER): Payer: BLUE CROSS/BLUE SHIELD | Admitting: Family Medicine

## 2016-08-04 ENCOUNTER — Encounter: Payer: Self-pay | Admitting: Family Medicine

## 2016-08-04 VITALS — BP 120/80 | HR 67 | Temp 98.0°F | Ht 66.5 in | Wt 162.5 lb

## 2016-08-04 DIAGNOSIS — R152 Fecal urgency: Secondary | ICD-10-CM

## 2016-08-04 DIAGNOSIS — D721 Eosinophilia: Secondary | ICD-10-CM | POA: Diagnosis not present

## 2016-08-04 DIAGNOSIS — K50919 Crohn's disease, unspecified, with unspecified complications: Secondary | ICD-10-CM | POA: Diagnosis not present

## 2016-08-04 DIAGNOSIS — D72119 Hypereosinophilic syndrome (hes), unspecified: Secondary | ICD-10-CM

## 2016-08-04 LAB — CBC WITH DIFFERENTIAL/PLATELET
BASOS ABS: 0.1 10*3/uL (ref 0.0–0.1)
Basophils Relative: 0.4 % (ref 0.0–3.0)
EOS ABS: 0 10*3/uL (ref 0.0–0.7)
Eosinophils Relative: 0.1 % (ref 0.0–5.0)
HEMATOCRIT: 44.3 % (ref 39.0–52.0)
Hemoglobin: 14.3 g/dL (ref 13.0–17.0)
LYMPHS ABS: 1.1 10*3/uL (ref 0.7–4.0)
MCHC: 32.2 g/dL (ref 30.0–36.0)
MCV: 90.6 fl (ref 78.0–100.0)
MONO ABS: 0.7 10*3/uL (ref 0.1–1.0)
Monocytes Relative: 4 % (ref 3.0–12.0)
NEUTROS ABS: 15.1 10*3/uL — AB (ref 1.4–7.7)
Neutrophils Relative %: 89.1 % — ABNORMAL HIGH (ref 43.0–77.0)
PLATELETS: 325 10*3/uL (ref 150.0–400.0)
RBC: 4.89 Mil/uL (ref 4.22–5.81)
RDW: 14.2 % (ref 11.5–15.5)
WBC: 16.9 10*3/uL — ABNORMAL HIGH (ref 4.0–10.5)

## 2016-08-04 LAB — COMPREHENSIVE METABOLIC PANEL
ALT: 18 U/L (ref 0–53)
AST: 22 U/L (ref 0–37)
Albumin: 4.6 g/dL (ref 3.5–5.2)
Alkaline Phosphatase: 51 U/L (ref 39–117)
BILIRUBIN TOTAL: 0.6 mg/dL (ref 0.2–1.2)
BUN: 23 mg/dL (ref 6–23)
CALCIUM: 9.9 mg/dL (ref 8.4–10.5)
CO2: 31 meq/L (ref 19–32)
CREATININE: 1.26 mg/dL (ref 0.40–1.50)
Chloride: 101 mEq/L (ref 96–112)
GFR: 64.65 mL/min (ref >60.00)
Glucose, Bld: 112 mg/dL — ABNORMAL HIGH (ref 70–99)
Potassium: 4.7 mEq/L (ref 3.5–5.1)
SODIUM: 140 meq/L (ref 135–145)
Total Protein: 7.5 g/dL (ref 6.0–8.3)

## 2016-08-04 LAB — SEDIMENTATION RATE: SED RATE: 5 mm/h (ref 0–15)

## 2016-08-04 LAB — LIPASE: LIPASE: 25 U/L (ref 11.0–59.0)

## 2016-08-04 MED ORDER — PREDNISONE 1 MG PO TABS: 1.0000 mg | ORAL_TABLET | Freq: Every day | ORAL | 1 refills | Status: DC

## 2016-08-04 MED ORDER — PREDNISONE 10 MG PO TABS: 30.0000 mg | ORAL_TABLET | Freq: Every day | ORAL | 1 refills | Status: DC

## 2016-08-04 MED ORDER — PREDNISONE 20 MG PO TABS: 20.0000 mg | ORAL_TABLET | Freq: Every day | ORAL | 1 refills | Status: DC

## 2016-08-04 NOTE — Progress Notes (Addendum)
BP 120/80   Pulse 67   Temp 98 F (36.7 C) (Oral)   Ht 5' 6.5" (1.689 m)   Wt 162 lb 8 oz (73.7 kg)   BMI 25.84 kg/m    CC: diarrhea Subjective:    Patient ID: Jack [name], male    DOB: 1965/11/25, 51 y.o.   MRN: [id number]  HPI: Jack [name] is a 51 y.o. male presenting on 08/04/2016 for Diarrhea (has Crohn's Disease) and Medication Refill (Requesting Rx for Prednison 20 mg & 1 mg)   Known crohn's disease previously followed by [facility] in [place]. He would like to establish with local GI as Dr Tamsen Meek will be retiring soon. Entyvio stopped ~6 months ago. He was only taking prednisone 58m daily, attempting wean unsuccessfully. Known h/o eosinophilic pneumonia.   Several week h/o stool urgency worse with any meals. He did have stool incontinence x1. Started mercaptopurine 51mdaily. He also increased prednisone 1 wk ago to 4087maily for a few days, decreasing quickly to 56m3mter no significant improvement.. Has also been treating this with lomotil 2 pills in the morning.   Denies fevers/chills, blood in stool, appetite ok. No urinary symptoms. No worsening joint pains, oral lesions, rash, uveitis or red eyes.  No recent antibiotics. No recent travel.   He was prior on colesevelam scoopful daily to help control diarrhea.  Taking trip tomorrow with wife to resort in [place] for 5 night stay  Relevant past medical, surgical, family and social history reviewed and updated as indicated. Interim medical history since our last visit reviewed. Allergies and medications reviewed and updated. Outpatient Medications Prior to Visit  Medication Sig Dispense Refill  . amLODipine (NORVASC) 5 MG tablet Take 1 tablet (5 mg total) by mouth daily. 90 tablet 3  . budesonide-formoterol (SYMBICORT) 160-4.5 MCG/ACT inhaler Inhale 2 puffs into the lungs 2 (two) times daily. 1 Inhaler 3  . Calcium Carbonate-Vit D-Min (CALCIUM 1200 PO) Take 1 capsule by mouth 2 (two) times daily.    .  diphenoxylate-atropine (LOMOTIL) 2.5-0.025 MG tablet Take 1 tablet by mouth 4 (four) times daily as needed for diarrhea or loose stools. 30 tablet 0  . famotidine (PEPCID) 20 MG tablet Take 20 mg by mouth 2 (two) times daily.    . mercaptopurine (PURINETHOL) 50 MG tablet Take 50 mg by mouth daily. Give on an empty stomach 1 hour before or 2 hours after meals. Caution: Chemotherapy.    . methocarbamol (ROBAXIN) 500 MG tablet Take 1 tablet (500 mg total) by mouth 2 (two) times daily as needed for muscle spasms (sedation precautions). 30 tablet 0  . Multiple Vitamin (MULTIVITAMIN) capsule Take 1 capsule by mouth daily.    . oxMarland KitchenCODONE-acetaminophen (PERCOCET/ROXICET) 5-325 MG tablet Take by mouth every 4 (four) hours as needed for severe pain.    . tadalafil (CIALIS) 5 MG tablet Take 1 tablet (5 mg total) by mouth daily as needed for erectile dysfunction. 30 tablet 3  . tamsulosin (FLOMAX) 0.4 MG CAPS capsule TAKE 1 CAPSULE (0.4 MG TOTAL) BY MOUTH DAILY. 30 capsule 3  . predniSONE (DELTASONE) 10 MG tablet TAKE 2 TABLETS BY MOUTH DAILY (Patient taking differently: TABLETS 3 BY MOUTH DAILY) 60 tablet 3  . hydrOXYzine (VISTARIL) 100 MG capsule Take 1 capsule (100 mg total) by mouth daily. 90 capsule 3  . Vedolizumab (ENTYVIO IV) Inject into the vein. Every 7 weeks     No facility-administered medications prior to visit.  Per HPI unless specifically indicated in ROS section below Review of Systems     Objective:    BP 120/80   Pulse 67   Temp 98 F (36.7 C) (Oral)   Ht 5' 6.5" (1.689 m)   Wt 162 lb 8 oz (73.7 kg)   BMI 25.84 kg/m   Wt Readings from Last 3 Encounters:  08/04/16 162 lb 8 oz (73.7 kg)  06/30/16 165 lb 8 oz (75.1 kg)  03/18/16 160 lb 1.9 oz (72.6 kg)    Physical Exam  Constitutional: He appears well-developed and well-nourished. No distress.  HENT:  Mouth/Throat: Oropharynx is clear and moist. No oropharyngeal exudate.  Cardiovascular: Normal rate, regular rhythm,  normal heart sounds and intact distal pulses.   No murmur heard. Pulmonary/Chest: Effort normal and breath sounds normal. No respiratory distress. He has no wheezes. He has no rales.  Abdominal: Soft. Normal appearance and bowel sounds are normal. He exhibits no distension and no mass. There is no hepatosplenomegaly. There is no tenderness. There is no rigidity, no rebound, no guarding, no CVA tenderness and negative Murphy's sign.  Musculoskeletal: He exhibits no edema.  Skin: Skin is warm and dry. No rash noted.  Psychiatric: He has a normal mood and affect.  Nursing note and vitals reviewed.  Results for orders placed or performed in visit on 03/18/16  Comprehensive metabolic panel  Result Value Ref Range   Sodium 141 135 - 145 mEq/L   Potassium 4.9 3.5 - 5.1 mEq/L   Chloride 103 96 - 112 mEq/L   CO2 32 19 - 32 mEq/L   Glucose, Bld 100 (H) 70 - 99 mg/dL   BUN 18 6 - 23 mg/dL   Creatinine, Ser 1.15 0.40 - 1.50 mg/dL   Total Bilirubin 0.6 0.2 - 1.2 mg/dL   Alkaline Phosphatase 43 39 - 117 U/L   AST 13 0 - 37 U/L   ALT 13 0 - 53 U/L   Total Protein 6.8 6.0 - 8.3 g/dL   Albumin 4.3 3.5 - 5.2 g/dL   Calcium 9.4 8.4 - 10.5 mg/dL   GFR 71.95 >60.00 mL/min  Lipid panel  Result Value Ref Range   Cholesterol 194 0 - 200 mg/dL   Triglycerides 89.0 0.0 - 149.0 mg/dL   HDL 52.40 >39.00 mg/dL   VLDL 17.8 0.0 - 40.0 mg/dL   LDL Cholesterol 123 (H) 0 - 99 mg/dL   Total CHOL/HDL Ratio 4    NonHDL 141.21   TSH  Result Value Ref Range   TSH 1.35 0.35 - 4.50 uIU/mL      Assessment & Plan:   Problem List Items Addressed This Visit    Crohn disease ([place]) - Primary    He has increased fecal urgency without frank diarrhea, and without abdominal pain. Not consistent with infectious cause - and not enough stool to get adequate sample for testing. Will continue current regimen for now (prednisone 78m daily, lomotil in am), will try and expedite GI referral to establish locally. Pt and wife  request [facility].  He will be out of the country over the next week.   longterm on prednisone - requests refill of 150m [id number] so he can continue prolonged taper once he's feeling better. Refilled today.       Relevant Orders   Comprehensive metabolic panel   CBC with Differential/Platelet   Sedimentation rate   Lipase   Ambulatory referral to Gastroenterology   Defecation urgency    Of unclear  cause - anticipate crohn's related.  Check labs today. Wife thinks some sxs are anxiety related - consider librax.       Relevant Orders   Ambulatory referral to Gastroenterology   Hypereosinophilic syndrome    Thought this vs eosinophilic pneumonia - appreciate pulm care. Now managed with symbicort daily. Followed by Dr Ashby Dawes.       Relevant Medications   predniSONE (DELTASONE) 20 MG tablet   predniSONE (DELTASONE) 1 MG tablet   predniSONE (DELTASONE) 10 MG tablet       Follow up plan: No Follow-up on file.  [name], MD

## 2016-08-04 NOTE — Assessment & Plan Note (Signed)
Thought this vs eosinophilic pneumonia - appreciate pulm care. Now managed with symbicort daily. Followed by Dr Ashby Dawes.

## 2016-08-04 NOTE — Assessment & Plan Note (Addendum)
He has increased fecal urgency without frank diarrhea, and without abdominal pain. Not consistent with infectious cause - and not enough stool to get adequate sample for testing. Will continue current regimen for now (prednisone 73m daily, lomotil in am), will try and expedite GI referral to establish locally. Pt and wife request [facility].  He will be out of the country over the next week.   longterm on prednisone - requests refill of 132m 1041m40m9mblets so he can continue prolonged taper once he's feeling better. Refilled today.

## 2016-08-04 NOTE — Assessment & Plan Note (Addendum)
Of unclear cause - anticipate crohn's related.  Check labs today. Wife thinks some sxs are anxiety related - consider librax.

## 2016-08-04 NOTE — Patient Instructions (Addendum)
Continue prednisone 6m daily through your trip.  Prednisone tablets refilled.  Labs today.  See MRosaria Ferrieson your way out.

## 2016-08-04 NOTE — Progress Notes (Signed)
Pre visit review using our clinic review tool, if applicable. No additional management support is needed unless otherwise documented below in the visit note. 

## 2016-08-09 ENCOUNTER — Other Ambulatory Visit: Payer: Self-pay | Admitting: Family Medicine

## 2016-08-11 ENCOUNTER — Encounter: Payer: Self-pay | Admitting: Nurse Practitioner

## 2016-08-11 MED ORDER — DIPHENOXYLATE-ATROPINE 2.5-0.025 MG PO TABS: 1.0000 | ORAL_TABLET | Freq: Four times a day (QID) | ORAL | 0 refills | Status: DC | PRN

## 2016-08-11 NOTE — Telephone Encounter (Signed)
I was refilling pt's amlodipine and in error refilled the lomotil (set for call in). I didn't call in Rx without getting Dr. Synthia Innocent approval but last filled on 07/28/16 #30 with 0 refills, pt scheduled his GI appt with Rosaria Ferries for next week but he only has one pill left, is it okay to call in Rx, please advise

## 2016-08-12 NOTE — Telephone Encounter (Signed)
plz phone in. 

## 2016-08-12 NOTE — Telephone Encounter (Signed)
Left v/m requesting refill on lomotil; I spoke with pt and advised [facility] [place]. Pt voiced understanding and was appreciative.

## 2016-08-12 NOTE — Telephone Encounter (Signed)
Rx called to pharmacy as instructed. 

## 2016-08-18 ENCOUNTER — Other Ambulatory Visit: Payer: BLUE CROSS/BLUE SHIELD

## 2016-08-18 ENCOUNTER — Encounter: Payer: Self-pay | Admitting: Nurse Practitioner

## 2016-08-18 ENCOUNTER — Ambulatory Visit (INDEPENDENT_AMBULATORY_CARE_PROVIDER_SITE_OTHER): Payer: BLUE CROSS/BLUE SHIELD | Admitting: Nurse Practitioner

## 2016-08-18 VITALS — BP 124/70 | HR 61 | Ht 66.5 in | Wt 163.0 lb

## 2016-08-18 DIAGNOSIS — K50918 Crohn's disease, unspecified, with other complication: Secondary | ICD-10-CM

## 2016-08-18 MED ORDER — DIPHENOXYLATE-ATROPINE 2.5-0.025 MG PO TABS: 1.0000 | ORAL_TABLET | Freq: Four times a day (QID) | ORAL | 0 refills | Status: DC | PRN

## 2016-08-18 NOTE — Patient Instructions (Signed)
Please go to the basement level to our lab for a stool study.  We made you a follow up appointment with Dr. Zenovia Jarred for 10-02-2016 at 9:00 am. We sent refills for the Lomotil tablets.

## 2016-08-18 NOTE — Progress Notes (Addendum)
HPI: Patient is a 51 year old male with > 25 year history of Crohn's ileocolitis referred by PCP, Dr. [name] for Crohn's management. He also has a history of pulmonary hypereosinophilia treated with steroids. His Crohn's has been refractory of mesalamine, remicade and humira. He was treated with Entyvio, not sure why that was stopped. He is steroid dependent  Patient moved from [place] last year where he was followed by Dr. Mikki Santee with [facility]. I do not have any office records. I gleaned some information from Care Everywhere.    Colonoscopy by Dr. Mikki Santee June 2016, while on [place], revealed a normal ileum and overall marked improvement compared to prior exams. Four semi- pedunculated inflammatory polyps were removed, 15-25 mm in size. There was mild chronic active colitis in the sigmoid colon and the rectum, mild quiescent colitis in the descending colon . No inflammation was found in the cecum, ascending, or transverse colon.   Interestingly patient was hospitalized just a few months later in November with acute worsening of crampy diarrhea. CTscan revealed abnormal circumferential mural thickening of the transverse, descending and sigmoid colon . Inpatient colonoscopy with biopsies 02/18/15 revealed chronic, severely active colitis of the whole colon and rectum and only mild ileitis (see results below).  Stool cultures were positive for camphylobacter and salmonella. He was treated with antibiotics and IV steroids. It appears Entyvio was continued at time of discharge and he was started on 75m. Patient hasn't seen Dr. BMikki Santeesince . He is no longer on Entyvio.   Patient currently takes 5353mof 53m253mHe self adjusts prednisone depending on symptoms. He saw Dr. GutDanise Mina7 for worsening diarrhea and urgency.  Patient wonders if alcohol may have caused the flare type symptoms. He never drank alcohol until after kids went off to college. Recently started having 1- 2  alcoholic beverages a day and this is the only thing he has changed. When diarrhea worsened patient tried increasing his prednisone to 40 mg without much improvement. Adding Lomotil was really what helped the most. PCP continued current regimen, made GI referral and obtained labs. CMET okay, WBC was 16.9 (? Steroids). ESR was 5.   Patient is overall feeling better as long as he takes the lomotil. No fevers. No joint aches. He does have scant blood from time to time. His stools are mainly solid. No nocturnal stools unless he doesn't take Lomotil in the evening.   Care Everywhere:  November 2016 Procedures  Procedure: Colonoscopy Indications: Crohn's disease of the colon, Disease activity  assessment of Crohn's disease of the colon Medicines: Monitored Anesthesia Care Referring Provider: CHAAuburn Bilberry.D., CHRMclaren Lapeer RegionLRee ShayM.D. Estimated Blood Loss: Estimated blood loss: none. Estimated blood loss was  minimal. Procedure: Pre-Anesthesia Assessment: - Prior to the procedure, a History and Physical was  performed, and patient medications, allergies and  sensitivities have been reviewed. - The risks and benefits of the procedure and the  sedation options and risks were discussed with the  patient. All questions were answered and informed  consent was obtained. After obtaining informed consent, the scope was passed  under direct vision. Throughout the procedure, the  patient's blood pressure, pulse, and oxygen saturations  were monitored continuously. The OEC 624 was introduced  through the anus and advanced to the the terminal  ileum. The colonoscopy was performed without  difficulty. The patient tolerated the procedure well.  The quality of the bowel preparation was good. Findings: The perianal exam findings include a  skin tag. A localized area of the terminal ileum was congested. This was biopsied  with a cold forceps for histology. The ileocecal valve was soft,    pliable, non-ulcerated, normal. A diffuse area of moderately friable mucosa with contact bleeding was  found in the sigmoid colon, descending colon, transverse colon. Inflammation characterized by adherent blood, altered vascularity,  congestion (edema), erosions, erythema, friability, granularity, mucus  and pseudopolyps/inflammatory polyp was found in a continuous and  circumferential pattern from the sigmoid colon to the hepatic flexure.  The rectum and the ascending colon were relatively spared. This was  severe in severity. Biopsies were taken from the terminal ileum,  ascending colon, transverse colon, left colon (descending colon and  sigmoid colon) and rectum with a cold forceps for histology. Three large pedunculated polyps were found in the sigmoid colon, in the  distal sigmoid colon, in the descending colon, in the transverse colon  and in the mid transverse colon. The polyps were 19 to 30 mm in size.  These polyps were removed with a hot snare. Resection and retrieval were  complete. . Complications: No immediate complications. _______________________________________________________________________________ Impression: - Exuberant but not indurated anal cuffing found on  perianal exam. - Granularity in the distal ileum. - Exudate colon. - Friability with contact bleeding in the sigmoid colon. - Inflammation was found from the sigmoid colon to the  hepatic flexure secondary to Crohn's disease with  colonic involvement. Biopsied. - Three 19 to 30 mm polyps in the sigmoid colon, in the  distal sigmoid colon, in the descending colon, in the  transverse colon and in the mid transverse colon.  Resected and retrieved. - Perianal Crohn's disease. - The findings are consistent with moderately severe,  steroid dependent Corhn's disease. Recommendation: - Return patient to hospital ward for ongoing care. - Low fiber diet and lactose free diet indefinitely. - Continue Entyvio  (vedolizumab) intravenous infusion:  300 mg IV once per week every 8 weeks. - Use methylprednisolone 20 mg IV three times a day  while inpatient. Methylprednisolone 60 mg IV once  tonight. - TPMT genotype to guide thiopurine immunomodulator or  management as a steroid sparing medication - The Crohn's disease, inflammatory polyarthropathy,  pulmonary infiltrates and peripheral eosinophilia are  all likely manifestations of the same condition, namely  Crohn's disease. - Await pathology results. Changes in medical  management must be done in concert with patient's  primary gastroenterologist, Dr. Gus Height Electronically Signed By Georges Lynch. Loma Messing, MD _____________________________ Rochele Raring, MD   A. Terminal ileum, biopsy: -Mild active ileitis and mild crypt architectural distortion.  B. Colon, ascending, biopsy: -Chronic moderately active colitis. -Few small lymphohistiocytic aggregates present.  C. Colon, transverse, biopsy: -Chronic severely active colitis with surface erosion.  D. Colon, transverse, pseudopolyp: -Fragments of inflammatory pseudopolyp.  E. Colon, descending, biopsy: -Chronic severely active colitis with focal surface erosion.  F. Colon, sigmoid, biopsy: -Chronic severely active colitis with surface erosion.  G. Rectum, biopsy: -Chronic severely active colitis with surface erosion and granulation tissue.  H. Rectum, polyp: -Fragments of inflammatory pseudopolyp.   Comment A-H. The clinical history of Crohn's disease is noted. The biopsies show diffuse involvement by chronic active colitis, characterized by cryptitis, crypt abscesses, and surface erosion. There is crypt architectural distortion and basal plasmacytosis. The biopsies are negative for       Past Medical History:  Diagnosis Date  . BPH (benign prostatic hypertrophy)   . Crohn disease ([place]) 1992   history uveitis, involvement of  intestines and lungs  .  Eosinophilic esophagitis   . Essential hypertension   . History of chicken pox   . History of gastroesophageal reflux (GERD)   . History of seizure 1995   grand mal x1, completed 6 yrs dilantin. no seizures since  . Osteoporosis 11/2015   DEXA T -2.9  . Schwannoma 2007   L axilla s/p surgery  . Ulnar neuropathy    h/o this from L arm schwannoma     Past Surgical History:  Procedure Laterality Date  . APPENDECTOMY  2000  . CARDIOVASCULAR STRESS TEST  01/2015   low risk study  . CHOLECYSTECTOMY  2000  . COLONOSCOPY  08/2014   f/u crohn's, 4 inflammatory polyps, diverticulosis, improved, rpt 2 yrs ([place])  . COLONOSCOPY  01/2015   during hospitalization - active ileitis/colitis  . ESOPHAGOGASTRODUODENOSCOPY  07/2014   h/o EE resolved, focal reflux esophagitis, chronic active gastritis  . extremity surgery Left    ulnar nerve repair after schwannoma removal  . INGUINAL HERNIA REPAIR Bilateral 2017  . LUMBAR SPINE SURGERY  2011  . SPINAL CORD STIMULATOR IMPLANT  2012   x2 (Dr Berton Mount)  . TONSILLECTOMY  1996  . TUMOR REMOVAL Left 2007   schwannoma from L armpit   Family History  Problem Relation Age of Onset  . CAD Mother        CABG  . Congenital heart disease Mother   . Hypertension Mother   . Hyperlipidemia Mother   . CAD Father        CABG  . Hypertension Father   . Hyperlipidemia Father   . Cancer Maternal Grandmother        colon  . Diabetes Neg Hx    Social History  Substance Use Topics  . Smoking status: Never Smoker  . Smokeless tobacco: Never Used  . Alcohol use Yes     Comment: 1 beer/day   Current Outpatient Prescriptions  Medication Sig Dispense Refill  . amLODipine (NORVASC) 5 MG tablet TAKE 1 TABLET (5 MG TOTAL) BY MOUTH DAILY. 30 tablet 5  . budesonide-formoterol (SYMBICORT) 160-4.5 MCG/ACT inhaler Inhale 2 puffs into the lungs 2 (two) times daily. 1 Inhaler 3  . Calcium Carbonate-Vit D-Min (CALCIUM 1200 PO) Take 1 capsule by mouth 2 (two)  times daily.    . Colesevelam HCl 3.75 g PACK 1 scoop nightly    . diphenoxylate-atropine (LOMOTIL) 2.5-0.025 MG tablet Take 1 tablet by mouth 4 (four) times daily as needed for diarrhea or loose stools. 30 tablet 0  . famotidine (PEPCID) 20 MG tablet Take 20 mg by mouth 2 (two) times daily.    . hydrOXYzine (ATARAX/VISTARIL) 25 MG tablet TAKE UP TO 4 TABLETS BY MOUTH ONCE DAILY AT BEDTIME AS NEEDED FOR ITCHING.  3  . mercaptopurine (PURINETHOL) 50 MG tablet Take 50 mg by mouth daily. Give on an empty stomach 1 hour before or 2 hours after meals. Caution: Chemotherapy.    . methocarbamol (ROBAXIN) 500 MG tablet Take 1 tablet (500 mg total) by mouth 2 (two) times daily as needed for muscle spasms (sedation precautions). 30 tablet 0  . Multiple Vitamin (MULTIVITAMIN) capsule Take 1 capsule by mouth daily.    . predniSONE (DELTASONE) 1 MG tablet Take 1 tablet (1 mg total) by mouth daily with breakfast. As directed 100 tablet 1  . predniSONE (DELTASONE) 10 MG tablet Take 3 tablets (30 mg total) by mouth daily. 60 tablet 1  . predniSONE (DELTASONE) 20 MG tablet Take  1 tablet (20 mg total) by mouth daily with breakfast. As directed 30 tablet 1  . tadalafil (CIALIS) 5 MG tablet Take 1 tablet (5 mg total) by mouth daily as needed for erectile dysfunction. 30 tablet 3  . tamsulosin (FLOMAX) 0.4 MG CAPS capsule TAKE 1 CAPSULE (0.4 MG TOTAL) BY MOUTH DAILY. 30 capsule 3   No current facility-administered medications for this visit.    Allergies  Allergen Reactions  . Cephalexin Nausea And Vomiting    Other Reaction: GI UPSET  . Duloxetine Other (See Comments)    Headaches   . Remicade [Infliximab]   . Aspirin Hives and Rash     Review of Systems: All systems reviewed and negative except where noted in HPI.    Physical Exam: BP 124/70   Pulse 61   Ht 5' 6.5" (1.689 m)   Wt 163 lb (73.9 kg)   BMI 25.91 kg/m  Constitutional:  Well-developed, white male male in no acute  distress. Psychiatric: Normal mood and affect. Behavior is normal. EENT: . Conjunctivae are normal. No scleral icterus. Neck supple.  Cardiovascular: Normal rate, regular rhythm.  Pulmonary/chest: Effort normal and breath sounds normal. No wheezing, rales or rhonchi. Abdominal: Soft, nondistended, nontender. Bowel sounds active throughout. There are no masses palpable. No hepatomegaly. Extremities: no edema Lymphadenopathy: No cervical adenopathy noted. Neurological: Alert and oriented to person place and time. Skin: Skin is warm and dry. No rashes noted.   ASSESSMENT AND PLAN:   51 year old male with long-standing Crohn's ileocolitis. Patient was followed by GI in [place], unfortunately I do not have those office records. I have pieced together some information and colonoscopy reports from care everywhere when patient was hospitalized at [facility]. Crohn's refractory to Remicade and Humira. Treated with Entyvio as well, I'm unclear when or why it was stopped. Currently maintained on 59m of 621[place] variable doses of prednisone depending of symptoms.  Recent increase in diarrhea not really improved with increase in prednisone dose but better with lomotil. [place] needs a local GI [place]  -[facility] -Patient steroid dependent. He tapers prednisone to lowest dose possible. Ideally he could get off steroids but I don't have enough of his history at this point. It is important to know why the EnWeyman Rodneyas stopped.  -continue 78m21mt current dose.  -continue lomotil -I am going to check stool for c-diff given the recent worsening diarrhea. -Patient doesn't want to make any med changes until he figures out if ETOH was contributing to his symptoms. He will follow up with Dr. PyrHilarie Fredricksonrly July, or earlier if need be.     PauTye SavoyP  08/18/2016, 10:32 AM  Cc: GutRia BushD  Addendum: Reviewed and agree with initial  management. Complex IBD history I expect he will need restart of Entyvio (assuming no allergy) Await records Pyrtle, JayLajuan LinesD

## 2016-08-19 LAB — CLOSTRIDIUM DIFFICILE BY PCR

## 2016-08-21 ENCOUNTER — Telehealth: Payer: Self-pay | Admitting: Nurse Practitioner

## 2016-08-21 ENCOUNTER — Other Ambulatory Visit: Payer: Self-pay | Admitting: *Deleted

## 2016-08-21 MED ORDER — DIPHENOXYLATE-ATROPINE 2.5-0.025 MG PO TABS: 1.0000 | ORAL_TABLET | Freq: Four times a day (QID) | ORAL | 0 refills | Status: DC | PRN

## 2016-08-21 NOTE — Telephone Encounter (Signed)
I called the patient and apologized for the inconvenience . I did fax the script for the Lomotil when Jack [name] ordered it.However I just refaxed it to [facility] [employer] in [place] today 08-21-2016 at 1:43 PM.

## 2016-08-22 ENCOUNTER — Ambulatory Visit (INDEPENDENT_AMBULATORY_CARE_PROVIDER_SITE_OTHER): Payer: BLUE CROSS/BLUE SHIELD

## 2016-08-22 ENCOUNTER — Encounter: Payer: Self-pay | Admitting: Family Medicine

## 2016-08-22 ENCOUNTER — Ambulatory Visit: Payer: BLUE CROSS/BLUE SHIELD | Admitting: Internal Medicine

## 2016-08-22 ENCOUNTER — Ambulatory Visit (INDEPENDENT_AMBULATORY_CARE_PROVIDER_SITE_OTHER): Payer: BLUE CROSS/BLUE SHIELD | Admitting: Family Medicine

## 2016-08-22 VITALS — BP 150/68 | HR 91 | Temp 98.7°F | Ht 67.0 in | Wt 162.4 lb

## 2016-08-22 DIAGNOSIS — M25472 Effusion, left ankle: Secondary | ICD-10-CM

## 2016-08-22 DIAGNOSIS — Z7952 Long term (current) use of systemic steroids: Secondary | ICD-10-CM

## 2016-08-22 DIAGNOSIS — M25572 Pain in left ankle and joints of left foot: Secondary | ICD-10-CM

## 2016-08-22 DIAGNOSIS — M818 Other osteoporosis without current pathological fracture: Secondary | ICD-10-CM

## 2016-08-22 NOTE — Progress Notes (Signed)
Subjective:    Patient ID: Jack [name], male    DOB: 1965/09/12, 51 y.o.   MRN: [id number]  HPI This is a 51 yo male who presents today with left foot pain. Recently resumed running, at end of run pain started. Does not recall rolling ankle or injuring. Ran again and had pain again. Pain getting worse and he is noticing swelling of inner ankle. Using ice. No bruising. Pain started on inner ankle and has moved across top of foot and now goes from inner ankle to outer ankle across top of foot. No pain up into calf or across top or bottom of foot. No bruising. Pain worse after sitting for prolonged periods of time. Took some ibuprofen 400-800 mg qd with a little improvement. Tried wrapping with an elastic bandage last night, did not feel like it helped. Taking prednisone 20 mg daily for Crohn's. Tries to avoid excessive NSAID use. No worsening GI symptoms with ibuprofen. He requests pain medication.      Past Medical History:  Diagnosis Date  . BPH (benign prostatic hypertrophy)   . Crohn disease ([place]) 1992   history uveitis, involvement of intestines and lungs  . Eosinophilic esophagitis   . Essential hypertension   . History of chicken pox   . History of gastroesophageal reflux (GERD)   . History of seizure 1995   grand mal x1, completed 6 yrs dilantin. no seizures since  . Osteoporosis 11/2015   DEXA T -2.9  . Schwannoma 2007   L axilla s/p surgery  . Ulnar neuropathy    h/o this from L arm schwannoma   Past Surgical History:  Procedure Laterality Date  . APPENDECTOMY  2000  . CARDIOVASCULAR STRESS TEST  01/2015   low risk study  . CHOLECYSTECTOMY  2000  . COLONOSCOPY  08/2014   f/u crohn's, 4 inflammatory polyps, diverticulosis, improved, rpt 2 yrs ([place])  . COLONOSCOPY  01/2015   during hospitalization - active ileitis/colitis  . ESOPHAGOGASTRODUODENOSCOPY  07/2014   h/o EE resolved, focal reflux esophagitis, chronic active gastritis  . extremity surgery Left    ulnar nerve repair after schwannoma removal  . INGUINAL HERNIA REPAIR Bilateral 2017  . LUMBAR SPINE SURGERY  2011  . SPINAL CORD STIMULATOR IMPLANT  2012   x2 (Dr Berton Mount)  . TONSILLECTOMY  1996  . TUMOR REMOVAL Left 2007   schwannoma from L armpit   Family History  Problem Relation Age of Onset  . CAD Mother        CABG  . Congenital heart disease Mother   . Hypertension Mother   . Hyperlipidemia Mother   . CAD Father        CABG  . Hypertension Father   . Hyperlipidemia Father   . Cancer Maternal Grandmother        colon  . Diabetes Neg Hx    Social History  Substance Use Topics  . Smoking status: Never Smoker  . Smokeless tobacco: Never Used  . Alcohol use Yes     Comment: 1 beer/day      Review of Systems Per HPI    Objective:   Physical Exam  Constitutional: He is oriented to person, place, and time. He appears well-developed and well-nourished.  Cardiovascular: Normal rate.   Pulmonary/Chest: Effort normal.  Musculoskeletal:       Left ankle: He exhibits swelling (mild of medial malleolus). He exhibits normal range of motion, no ecchymosis, no deformity and normal pulse. No tenderness. Achilles tendon  exhibits no pain and no defect.  Neurological: He is alert and oriented to person, place, and time.  Skin: Skin is warm and dry.  Psychiatric: He has a normal mood and affect. His behavior is normal. Judgment and thought content normal.  Vitals reviewed.     BP (!) 150/68 (BP Location: Left Arm, Patient Position: Sitting, Cuff Size: Normal)   Pulse 91   Temp 98.7 F (37.1 C) (Oral)   Ht 5' 7"  (1.702 m)   Wt 162 lb 6.4 oz (73.7 kg)   SpO2 98%   BMI 25.44 kg/m  Wt Readings from Last 3 Encounters:  08/22/16 162 lb 6.4 oz (73.7 kg)  08/18/16 163 lb (73.9 kg)  08/04/16 162 lb 8 oz (73.7 kg)   BP Readings from Last 3 Encounters:  08/22/16 (!) 150/68  08/18/16 124/70  08/04/16 120/80       Assessment & Plan:  1. Acute left ankle pain - encouraged  him to wrap when on foot, continue supportive shoes, ice, avoid running until improved - DG Ankle 2 Views Left; Future  2. Left ankle swelling - DG Ankle 2 Views Left; Future  3. Current chronic use of systemic steroids - DG Ankle 2 Views Left; Future  4. Other osteoporosis, unspecified pathological fracture presence - DG Ankle 2 Views Left; Future   Clarene Reamer, FNP-BC  [facility] [place]  08/22/2016 4:32 PM

## 2016-08-22 NOTE — Patient Instructions (Signed)
Elevate as much as possible   Try ace wrap for during day  Ice as needed  You will be notified of xray report later today   Ankle Sprain An ankle sprain is a stretch or tear in one of the tough, fiber-like tissues (ligaments) in the ankle. The ligaments in your ankle help to hold the bones of the ankle together. What are the causes? This condition is often caused by stepping on or falling on the outer edge of the foot. What increases the risk? This condition is more likely to develop in people who play sports. What are the signs or symptoms? Symptoms of this condition include:  Pain in your ankle.  Swelling.  Bruising. Bruising may develop right after you sprain your ankle or 1-2 days later.  Trouble standing or walking, especially when you turn or change directions. How is this diagnosed? This condition is diagnosed with a physical exam. During the exam, your health care provider will press on certain parts of your foot and ankle and try to move them in certain ways. X-rays may be taken to see how severe the sprain is and to check for broken bones. How is this treated? This condition may be treated with:  A brace. This is used to keep the ankle from moving until it heals.  An elastic bandage. This is used to support the ankle.  Crutches.  Pain medicine.  Surgery. This may be needed if the sprain is severe.  Physical therapy. This may help to improve the range of motion in the ankle. Follow these instructions at home:  Rest your ankle.  Take over-the-counter and prescription medicines only as told by your health care provider.  For 2-3 days, keep your ankle raised (elevated) above the level of your heart as much as possible.  If directed, apply ice to the area:  Put ice in a plastic bag.  Place a towel between your skin and the bag.  Leave the ice on for 20 minutes, 2-3 times a day.  If you were given a brace:  Wear it as directed.  Remove it to shower or  bathe.  Try not to move your ankle much, but wiggle your toes from time to time. This helps to prevent swelling.  If you were given an elastic bandage (dressing):  Remove it to shower or bathe.  Try not to move your ankle much, but wiggle your toes from time to time. This helps to prevent swelling.  Adjust the dressing to make it more comfortable if it feels too tight.  Loosen the dressing if you have numbness or tingling in your foot, or if your foot becomes cold and blue.  If you have crutches, use them as told by your health care provider. Continue to use them until you can walk without feeling pain in your ankle. Contact a health care provider if:  You have rapidly increasing bruising or swelling.  Your pain is not relieved with medicine. Get help right away if:  Your toes or foot becomes numb or blue.  You have severe pain that gets worse. This information is not intended to replace advice given to you by your health care provider. Make sure you discuss any questions you have with your health care provider. Document Released: 03/17/2005 Document Revised: 07/25/2015 Document Reviewed: 10/17/2014 Elsevier Interactive Patient Education  2017 [employer].

## 2016-08-31 NOTE — Addendum Note (Signed)
Addended by: Jerene Bears on: 08/31/2016 09:34 PM   Modules accepted: Level of Service

## 2016-09-04 ENCOUNTER — Other Ambulatory Visit (INDEPENDENT_AMBULATORY_CARE_PROVIDER_SITE_OTHER): Payer: BLUE CROSS/BLUE SHIELD

## 2016-09-04 ENCOUNTER — Other Ambulatory Visit: Payer: Self-pay

## 2016-09-04 ENCOUNTER — Encounter: Payer: Self-pay | Admitting: *Deleted

## 2016-09-04 ENCOUNTER — Ambulatory Visit (INDEPENDENT_AMBULATORY_CARE_PROVIDER_SITE_OTHER): Payer: BLUE CROSS/BLUE SHIELD | Admitting: Internal Medicine

## 2016-09-04 VITALS — BP 138/78 | HR 60 | Ht 67.0 in | Wt 159.8 lb

## 2016-09-04 DIAGNOSIS — K50819 Crohn's disease of both small and large intestine with unspecified complications: Secondary | ICD-10-CM

## 2016-09-04 DIAGNOSIS — Z7952 Long term (current) use of systemic steroids: Secondary | ICD-10-CM | POA: Diagnosis not present

## 2016-09-04 DIAGNOSIS — Z79899 Other long term (current) drug therapy: Secondary | ICD-10-CM | POA: Diagnosis not present

## 2016-09-04 DIAGNOSIS — J82 Pulmonary eosinophilia, not elsewhere classified: Secondary | ICD-10-CM | POA: Diagnosis not present

## 2016-09-04 DIAGNOSIS — J8281 Chronic eosinophilic pneumonia: Secondary | ICD-10-CM

## 2016-09-04 DIAGNOSIS — K50811 Crohn's disease of both small and large intestine with rectal bleeding: Secondary | ICD-10-CM

## 2016-09-04 DIAGNOSIS — K5090 Crohn's disease, unspecified, without complications: Secondary | ICD-10-CM

## 2016-09-04 LAB — CBC WITH DIFFERENTIAL/PLATELET
BASOS ABS: 0.1 10*3/uL (ref 0.0–0.1)
Basophils Relative: 0.4 % (ref 0.0–3.0)
EOS PCT: 1.7 % (ref 0.0–5.0)
Eosinophils Absolute: 0.2 10*3/uL (ref 0.0–0.7)
HEMATOCRIT: 46.1 % (ref 39.0–52.0)
HEMOGLOBIN: 15.1 g/dL (ref 13.0–17.0)
LYMPHS PCT: 4.6 % — AB (ref 12.0–46.0)
Lymphs Abs: 0.7 10*3/uL (ref 0.7–4.0)
MCHC: 32.8 g/dL (ref 30.0–36.0)
MCV: 88.8 fl (ref 78.0–100.0)
MONOS PCT: 4.5 % (ref 3.0–12.0)
Monocytes Absolute: 0.6 10*3/uL (ref 0.1–1.0)
Neutro Abs: 12.5 10*3/uL — ABNORMAL HIGH (ref 1.4–7.7)
Neutrophils Relative %: 88.8 % — ABNORMAL HIGH (ref 43.0–77.0)
Platelets: 345 10*3/uL (ref 150.0–400.0)
RBC: 5.19 Mil/uL (ref 4.22–5.81)
RDW: 14.6 % (ref 11.5–15.5)
WBC: 14.1 10*3/uL — ABNORMAL HIGH (ref 4.0–10.5)

## 2016-09-04 LAB — COMPREHENSIVE METABOLIC PANEL
ALBUMIN: 4.7 g/dL (ref 3.5–5.2)
ALK PHOS: 66 U/L (ref 39–117)
ALT: 14 U/L (ref 0–53)
AST: 13 U/L (ref 0–37)
BILIRUBIN TOTAL: 0.7 mg/dL (ref 0.2–1.2)
BUN: 21 mg/dL (ref 6–23)
CO2: 29 mEq/L (ref 19–32)
Calcium: 9.9 mg/dL (ref 8.4–10.5)
Chloride: 101 mEq/L (ref 96–112)
Creatinine, Ser: 1.24 mg/dL (ref 0.40–1.50)
GFR: 65.83 mL/min (ref >60.00)
Glucose, Bld: 105 mg/dL — ABNORMAL HIGH (ref 70–99)
POTASSIUM: 4.5 meq/L (ref 3.5–5.1)
Sodium: 138 mEq/L (ref 135–145)
TOTAL PROTEIN: 8.1 g/dL (ref 6.0–8.3)

## 2016-09-04 LAB — HEPATITIS B SURFACE ANTIGEN: Hepatitis B Surface Ag: NEGATIVE

## 2016-09-04 LAB — HEPATITIS B SURFACE ANTIBODY,QUALITATIVE: HEP B S AB: NEGATIVE

## 2016-09-04 LAB — HEPATITIS C ANTIBODY: HCV Ab: NEGATIVE

## 2016-09-04 LAB — HIGH SENSITIVITY CRP: CRP HIGH SENSITIVITY: 26.97 mg/L — AB (ref 0.000–5.000)

## 2016-09-04 LAB — HEPATITIS B CORE ANTIBODY, TOTAL: HEP B C TOTAL AB: NONREACTIVE

## 2016-09-04 NOTE — Progress Notes (Signed)
Subjective:    Patient ID: Jack [name], male    DOB: Apr 26, 1965, 51 y.o.   MRN: [id number]  HPI Jack [name] is a 51 year old male with a past medical history of ileocolonic Crohn's disease, chronic eosinophilic pneumonia who is seen in follow-up. He is recently transferred care to our practice and was seen by Jack Savoy, PA-C on 08/18/2016. He was managed previously by Dr. Mikki [name] in [place]. His Crohn's disease history is extensive and we have reviewed this today including old records. We are still waiting on outpatient GI records from [place]. These were re-requested today.  He has been steroid dependent for primarily from a chronic eosinophilic pneumonia standpoint. He was treated with Weyman Rodney which she stated helped his Crohn's disease significantly. He was also previous on Remicade and Humira which were less helpful. He has an seen by pulmonary in [place], Dr. Ashby [name]. Weyman Rodney was stopped thinking it may have been worsening his chronic eosinophilic pneumonia. His been off about 6 months but has still been unable to wean prednisone past 15 mg.   If he weans prednisone further he develops lung symptoms which are cough, fatigue and eventually fever. If he does not really increase prednisone fever will progress and he will become short of breath. He has been hospitalized for this multiple times.  Recently he has been dealing with a flare of what he feels is his Crohn's disease. Jack [name] check C. difficile after last visit and this was negative. He reports recent frequent bloody diarrhea with fecal urgency. He denies abdominal pain other than some lower abdominal cramping prior to bowel movement. He's been using Lomotil about 6 tablets a day to help control his loose stool. He's also been avoiding solid foods which worsens his diarrhea. The symptoms have been in remission with Entyvio previously.  He is using 6-MP 50 mg daily.  He was previously on pantoprazole but has been off  because he has not had GERD or indigestion. He is not using Pepcid.  Review of Systems  as per history of present illness, otherwise negative   Current Medications, Allergies, Past Medical History, Past Surgical History, Family History and Social History were reviewed in [employer] record.     Objective:   Physical Exam BP 138/78   Pulse 60 Comment: irregular  Ht 5' 7"  (1.702 m)   Wt 159 lb 12.8 oz (72.5 kg)   BMI 25.03 kg/m  Constitutional: Well-developed and well-nourished. No distress. HEENT: Normocephalic and atraumatic. Oropharynx is clear and moist. Conjunctivae are normal.  No scleral icterus. Neck: Neck supple. Trachea midline. Cardiovascular: Normal rate, regular rhythm and intact distal pulses. No M/R/G Pulmonary/chest: Effort normal and breath sounds normal. No wheezing, rales or rhonchi. Abdominal: Soft, nontender, nondistended. Bowel sounds active throughout. There are no masses palpable. No hepatosplenomegaly. Extremities: no clubbing, cyanosis, or edema Neurological: Alert and oriented to person place and time. Skin: Skin is warm and dry. Psychiatric: Normal mood and affect. Behavior is normal.      Assessment & Plan:  51 year old male with a past medical history of ileocolonic Crohn's disease, chronic eosinophilic pneumonia who is seen in follow-up. He is recently transferred care to our practice and was seen by Jack Savoy, PA-C on 08/18/2016  1. Ileocolonic Crohn's disease of long-standing duration  -- he is having symptoms of active Crohn's disease today including bloody diarrhea. He is on 6-MP, likely at subtherapeutic dose. He also needs reinitiation of Biologics. I recommended the following  --Increase prednisone to 40  mg daily 7 days. Wean prednisone by 5 mg every 7 days until back to 15 mg daily.  --Continue 6-MP 50 mg daily. Check thiopurine metabolite panel  --Check viral hepatitis serologies, CBC, CMP, CRP and quantiferon gold    --Plan to reinitiate Entyvio with induction. I will also discuss this with pulmonology  --Perform CT enterography  --Surveillance colonoscopy due October 2019 --Avoid NSAIDs --Will need bone density testing and dermatology annual visits for skin checks  2. Chronic eosinophilic pneumonia -- following with pulmonary, needs to call them for follow-up.

## 2016-09-04 NOTE — Patient Instructions (Signed)
Your physician has requested that you go to the basement for the following lab work before leaving today: CBC, CMP, CRP, Hepatitis B antigen, Hepatitis B antibody, hepatitis B core total, Hepatitis C, Quant gold, thiopurine metabolite level  Continue mercaptopurine at current dose.  We will work on getting Entyvio started for you as soon as possible. If you have not heard back from [place] within the next 2-3 business days, please call and ask to speak to [place].  Dr [name] has advised that you be on a prednisone taper. The taper instructions are as follows: Prednisone 40 mg daily x 7 days Prednisone 35 mg daily x 7 days Prednisone 30 mg daily x 7 days Prednisone 25 mg daily x 7 days Prednisone 20 mg daily x 7 days Prednisone 15 mg daily (remain on this dosage until told otherwise by Dr [name])  _______________________________________________________________________________________________ Dennis Bast have been scheduled for a CT enterography scan of the abdomen and pelvis at [facility] ([address]---this is in the same building as [facility]).   You are scheduled on Wednesday at 2:30 pm. You should arrive no later than 1:15 pm for your appointment time for registration and prep. Please follow the written instructions below on the day of your exam:  WARNING: IF YOU ARE ALLERGIC TO IODINE/X-RAY DYE, PLEASE NOTIFY RADIOLOGY IMMEDIATELY AT [phone]! YOU WILL BE GIVEN A 13 HOUR PREMEDICATION PREP.  1) Do not eat or drink anything after 10:30 am (4 hours prior to your test)  ________________________________________________________________________________________________ If you are age 45 or older, your body mass index should be between 23-30. Your Body mass index is 25.03 kg/m. If this is out of the aforementioned range listed, please consider follow up with your Primary Care Provider.  If you are age 34 or younger, your body mass index should be between 19-25. Your Body mass  index is 25.03 kg/m. If this is out of the aformentioned range listed, please consider follow up with your Primary Care Provider.

## 2016-09-05 LAB — QUANTIFERON TB GOLD ASSAY (BLOOD)
Interferon Gamma Release Assay: NEGATIVE
Mitogen-Nil: 0.66 IU/mL
Quantiferon Nil Value: 0.05 IU/mL
Quantiferon Tb Ag Minus Nil Value: <0 IU/mL

## 2016-09-06 ENCOUNTER — Other Ambulatory Visit: Payer: Self-pay | Admitting: Family Medicine

## 2016-09-08 ENCOUNTER — Telehealth: Payer: Self-pay | Admitting: Internal Medicine

## 2016-09-08 MED ORDER — DIPHENOXYLATE-ATROPINE 2.5-0.025 MG PO TABS: ORAL_TABLET | ORAL | 2 refills | Status: DC

## 2016-09-08 NOTE — Telephone Encounter (Signed)
Crohn disease ([place]) - Primary    He has increased fecal urgency without frank diarrhea, and without abdominal pain. Not consistent with infectious cause - and not enough stool to get adequate sample for testing. Will continue current regimen for now (prednisone 29m daily, lomotil in am), will try and expedite GI referral to establish locally. Pt and wife request [facility].  He will be out of the country over the next week.   longterm on prednisone - requests refill of 153m [id number] so he can continue prolonged taper once he's feeling better. Refilled today.     Would you like to refill prednisone at this dose?

## 2016-09-08 NOTE — Telephone Encounter (Signed)
I have spoken to Dr [name] who has okayed that I give patient a script for lomotil 1-2 tablets by mouth QID prn. Rx sent to pharmacy.   Patient advised.

## 2016-09-09 ENCOUNTER — Telehealth: Payer: Self-pay | Admitting: Internal Medicine

## 2016-09-09 NOTE — Telephone Encounter (Signed)
Per patient, pharmacy will not fill lomotil until Friday due to new FDA laws. Per Dr [name], patient may take up to 16 mg of imodium daily while waiting on lomotil rx. During our conversation, patient handed the phone to his wife so he could go to the bathroom. Patient's wife states that she is very concerned because patient is unable to eat at all. He is only able to drink fluid. Anytime he eats, he stays in the bathroom. She states that he has gone down on prednisone but that she wonders if he should be tapering at all right now. They are going out of town and this is also a concern for her.   Vaughan Basta, where are we in the [facility] process?  Dr [name], any additional ideas? CT is scheduled for later today...Marland KitchenMarland Kitchen

## 2016-09-09 NOTE — Telephone Encounter (Signed)
Patient calling back regarding this. Best call back # [phone].

## 2016-09-10 ENCOUNTER — Telehealth: Payer: Self-pay | Admitting: Internal Medicine

## 2016-09-10 ENCOUNTER — Inpatient Hospital Stay (HOSPITAL_COMMUNITY): Payer: BLUE CROSS/BLUE SHIELD

## 2016-09-10 ENCOUNTER — Inpatient Hospital Stay (HOSPITAL_COMMUNITY)
Admission: EM | Admit: 2016-09-10 | Discharge: 2016-09-14 | DRG: 385 | Disposition: A | Discharge status: Home / Self Care | Payer: BLUE CROSS/BLUE SHIELD | Attending: Internal Medicine | Admitting: Internal Medicine

## 2016-09-10 ENCOUNTER — Encounter (HOSPITAL_COMMUNITY): Payer: Self-pay | Admitting: *Deleted

## 2016-09-10 ENCOUNTER — Inpatient Hospital Stay: Admission: RE | Admit: 2016-09-10 | Payer: BLUE CROSS/BLUE SHIELD | Source: Ambulatory Visit

## 2016-09-10 DIAGNOSIS — K219 Gastro-esophageal reflux disease without esophagitis: Secondary | ICD-10-CM | POA: Diagnosis present

## 2016-09-10 DIAGNOSIS — K50011 Crohn's disease of small intestine with rectal bleeding: Secondary | ICD-10-CM | POA: Diagnosis present

## 2016-09-10 DIAGNOSIS — J82 Pulmonary eosinophilia, not elsewhere classified: Secondary | ICD-10-CM | POA: Diagnosis present

## 2016-09-10 DIAGNOSIS — R109 Unspecified abdominal pain: Secondary | ICD-10-CM

## 2016-09-10 DIAGNOSIS — Z7952 Long term (current) use of systemic steroids: Secondary | ICD-10-CM | POA: Diagnosis not present

## 2016-09-10 DIAGNOSIS — K50911 Crohn's disease, unspecified, with rectal bleeding: Secondary | ICD-10-CM | POA: Diagnosis not present

## 2016-09-10 DIAGNOSIS — K50811 Crohn's disease of both small and large intestine with rectal bleeding: Secondary | ICD-10-CM | POA: Diagnosis present

## 2016-09-10 DIAGNOSIS — M810 Age-related osteoporosis without current pathological fracture: Secondary | ICD-10-CM | POA: Diagnosis present

## 2016-09-10 DIAGNOSIS — K769 Liver disease, unspecified: Secondary | ICD-10-CM | POA: Diagnosis present

## 2016-09-10 DIAGNOSIS — Z79899 Other long term (current) drug therapy: Secondary | ICD-10-CM | POA: Diagnosis not present

## 2016-09-10 DIAGNOSIS — D1803 Hemangioma of intra-abdominal structures: Secondary | ICD-10-CM | POA: Diagnosis present

## 2016-09-10 DIAGNOSIS — N400 Enlarged prostate without lower urinary tract symptoms: Secondary | ICD-10-CM | POA: Diagnosis present

## 2016-09-10 DIAGNOSIS — K5090 Crohn's disease, unspecified, without complications: Secondary | ICD-10-CM | POA: Diagnosis present

## 2016-09-10 DIAGNOSIS — E43 Unspecified severe protein-calorie malnutrition: Secondary | ICD-10-CM | POA: Diagnosis present

## 2016-09-10 DIAGNOSIS — R7989 Other specified abnormal findings of blood chemistry: Secondary | ICD-10-CM

## 2016-09-10 DIAGNOSIS — I10 Essential (primary) hypertension: Secondary | ICD-10-CM | POA: Diagnosis present

## 2016-09-10 DIAGNOSIS — N179 Acute kidney failure, unspecified: Secondary | ICD-10-CM | POA: Diagnosis present

## 2016-09-10 LAB — BASIC METABOLIC PANEL
Anion gap: 8 (ref 5–15)
BUN: 19 mg/dL (ref 6–20)
CHLORIDE: 105 mmol/L (ref 101–111)
CO2: 28 mmol/L (ref 22–32)
CREATININE: 1.19 mg/dL (ref 0.61–1.24)
Calcium: 8.2 mg/dL — ABNORMAL LOW (ref 8.9–10.3)
GFR calc Af Amer: >60 mL/min (ref >60)
GFR calc non Af Amer: >60 mL/min (ref >60)
GLUCOSE: 120 mg/dL — AB (ref 65–99)
POTASSIUM: 4.8 mmol/L (ref 3.5–5.1)
SODIUM: 141 mmol/L (ref 135–145)

## 2016-09-10 LAB — CBC WITH DIFFERENTIAL/PLATELET
Basophils Absolute: 0.1 10*3/uL (ref 0.0–0.1)
Basophils Relative: 1 %
Eosinophils Absolute: 0.1 10*3/uL (ref 0.0–0.7)
Eosinophils Relative: 1 %
HEMATOCRIT: 43.6 % (ref 39.0–52.0)
HEMOGLOBIN: 14.9 g/dL (ref 13.0–17.0)
LYMPHS ABS: 0.7 10*3/uL (ref 0.7–4.0)
LYMPHS PCT: 8 %
MCH: 29.6 pg (ref 26.0–34.0)
MCHC: 34.2 g/dL (ref 30.0–36.0)
MCV: 86.7 fL (ref 78.0–100.0)
MONO ABS: 0.5 10*3/uL (ref 0.1–1.0)
MONOS PCT: 6 %
NEUTROS ABS: 7.8 10*3/uL — AB (ref 1.7–7.7)
NEUTROS PCT: 84 %
Platelets: 305 10*3/uL (ref 150–400)
RBC: 5.03 MIL/uL (ref 4.22–5.81)
RDW: 14.1 % (ref 11.5–15.5)
WBC: 9.2 10*3/uL (ref 4.0–10.5)

## 2016-09-10 LAB — COMPREHENSIVE METABOLIC PANEL
ALBUMIN: 3.9 g/dL (ref 3.5–5.0)
ALK PHOS: 60 U/L (ref 38–126)
ALT: 16 U/L — ABNORMAL LOW (ref 17–63)
ANION GAP: 10 (ref 5–15)
AST: 15 U/L (ref 15–41)
BILIRUBIN TOTAL: 0.7 mg/dL (ref 0.3–1.2)
BUN: 22 mg/dL — AB (ref 6–20)
CALCIUM: 8.9 mg/dL (ref 8.9–10.3)
CO2: 26 mmol/L (ref 22–32)
Chloride: 103 mmol/L (ref 101–111)
Creatinine, Ser: 1.45 mg/dL — ABNORMAL HIGH (ref 0.61–1.24)
GFR calc Af Amer: >60 mL/min (ref >60)
GFR, EST NON AFRICAN AMERICAN: 55 mL/min — AB (ref >60)
GLUCOSE: 122 mg/dL — AB (ref 65–99)
POTASSIUM: 4.2 mmol/L (ref 3.5–5.1)
Sodium: 139 mmol/L (ref 135–145)
TOTAL PROTEIN: 7.1 g/dL (ref 6.5–8.1)

## 2016-09-10 LAB — I-STAT CG4 LACTIC ACID, ED: Lactic Acid, Venous: 0.89 mmol/L (ref 0.5–1.9)

## 2016-09-10 LAB — ABO/RH: ABO/RH(D): O POS

## 2016-09-10 LAB — TYPE AND SCREEN
ABO/RH(D): O POS
ANTIBODY SCREEN: NEGATIVE

## 2016-09-10 MED ORDER — MORPHINE SULFATE (PF) 2 MG/ML IV SOLN: 4.0000 mg | Freq: Once | INTRAVENOUS | Status: DC

## 2016-09-10 MED ORDER — ONDANSETRON HCL 4 MG/2ML IJ SOLN
4.0000 mg | Freq: Once | INTRAMUSCULAR | Status: AC
  Administered 2016-09-10: 4 mg via INTRAVENOUS
  Filled 2016-09-10: qty 2

## 2016-09-10 MED ORDER — TRAMADOL HCL 50 MG PO TABS
50.0000 mg | ORAL_TABLET | Freq: Four times a day (QID) | ORAL | Status: DC | PRN
  Administered 2016-09-10: 50 mg via ORAL
  Filled 2016-09-10: qty 1

## 2016-09-10 MED ORDER — METHYLPREDNISOLONE SODIUM SUCC 125 MG IJ SOLR
125.0000 mg | Freq: Once | INTRAMUSCULAR | Status: AC
  Administered 2016-09-10: 125 mg via INTRAVENOUS
  Filled 2016-09-10: qty 2

## 2016-09-10 MED ORDER — SODIUM CHLORIDE 0.9 % IV SOLN
INTRAVENOUS | Status: AC
  Administered 2016-09-10 – 2016-09-11 (×3): via INTRAVENOUS

## 2016-09-10 MED ORDER — ONDANSETRON HCL 4 MG/2ML IJ SOLN: 4.0000 mg | Freq: Four times a day (QID) | INTRAMUSCULAR | Status: DC | PRN

## 2016-09-10 MED ORDER — ACETAMINOPHEN 325 MG PO TABS: 650.0000 mg | ORAL_TABLET | Freq: Four times a day (QID) | ORAL | Status: DC | PRN

## 2016-09-10 MED ORDER — HYDROXYZINE HCL 25 MG PO TABS
50.0000 mg | ORAL_TABLET | Freq: Three times a day (TID) | ORAL | Status: DC | PRN
  Administered 2016-09-10: 50 mg via ORAL
  Filled 2016-09-10: qty 2

## 2016-09-10 MED ORDER — DICYCLOMINE HCL 10 MG PO CAPS
10.0000 mg | ORAL_CAPSULE | Freq: Four times a day (QID) | ORAL | Status: DC | PRN
  Administered 2016-09-10: 10 mg via ORAL
  Filled 2016-09-10: qty 1

## 2016-09-10 MED ORDER — MOMETASONE FURO-FORMOTEROL FUM 200-5 MCG/ACT IN AERO
2.0000 | INHALATION_SPRAY | Freq: Two times a day (BID) | RESPIRATORY_TRACT | Status: DC
  Filled 2016-09-10: qty 8.8

## 2016-09-10 MED ORDER — BARIUM SULFATE 0.1 % PO SUSP
ORAL | Status: AC
  Administered 2016-09-10: 21:00:00
  Filled 2016-09-10: qty 3

## 2016-09-10 MED ORDER — MERCAPTOPURINE 50 MG PO TABS
50.0000 mg | ORAL_TABLET | Freq: Every day | ORAL | Status: DC
  Administered 2016-09-11 – 2016-09-14 (×4): 50 mg via ORAL
  Filled 2016-09-10 (×4): qty 1

## 2016-09-10 MED ORDER — DIPHENOXYLATE-ATROPINE 2.5-0.025 MG PO TABS
1.0000 | ORAL_TABLET | Freq: Four times a day (QID) | ORAL | Status: DC | PRN
  Administered 2016-09-10 – 2016-09-11 (×2): 1 via ORAL
  Filled 2016-09-10 (×2): qty 1

## 2016-09-10 MED ORDER — ONDANSETRON HCL 4 MG PO TABS: 4.0000 mg | ORAL_TABLET | Freq: Four times a day (QID) | ORAL | Status: DC | PRN

## 2016-09-10 MED ORDER — SODIUM CHLORIDE 0.9 % IV BOLUS (SEPSIS)
1000.0000 mL | Freq: Once | INTRAVENOUS | Status: AC
  Administered 2016-09-10: 1000 mL via INTRAVENOUS

## 2016-09-10 MED ORDER — METHYLPREDNISOLONE SODIUM SUCC 125 MG IJ SOLR
60.0000 mg | Freq: Four times a day (QID) | INTRAMUSCULAR | Status: DC
  Administered 2016-09-10 – 2016-09-11 (×3): 60 mg via INTRAVENOUS
  Filled 2016-09-10 (×3): qty 2

## 2016-09-10 MED ORDER — TAMSULOSIN HCL 0.4 MG PO CAPS
0.4000 mg | ORAL_CAPSULE | Freq: Every day | ORAL | Status: DC
  Administered 2016-09-10 – 2016-09-13 (×4): 0.4 mg via ORAL
  Filled 2016-09-10 (×4): qty 1

## 2016-09-10 MED ORDER — IOPAMIDOL (ISOVUE-300) INJECTION 61%
100.0000 mL | Freq: Once | INTRAVENOUS | Status: AC | PRN
  Administered 2016-09-10: 100 mL via INTRAVENOUS

## 2016-09-10 MED ORDER — IOPAMIDOL (ISOVUE-300) INJECTION 61%
INTRAVENOUS | Status: AC
  Filled 2016-09-10: qty 100

## 2016-09-10 MED ORDER — ACETAMINOPHEN 650 MG RE SUPP: 650.0000 mg | Freq: Four times a day (QID) | RECTAL | Status: DC | PRN

## 2016-09-10 NOTE — ED Triage Notes (Signed)
Patient is alert and oriented x4.  He is complaining of blood in his stool that has been on going for a few weeks.  Patient has a Hx of chron's disease and currently rates his pain 10 of 10.  Patient was sent by his PCP to be checked out.

## 2016-09-10 NOTE — Telephone Encounter (Signed)
I have spoken to Jack [name] by phone just now He's having further bloody diarrhea inability to eat or drink without a diarrhea This is despite prednisone 40 mg for the last 6 days Abdomen discomfort but not true pain Imodium has not been helpful  I have instructed him to go to the ER at [facility] for admission for flare of Crohn's disease He was supposed to have a CT enterography today but will cancel this and have cross-sectional imaging performed while hospitalized He will go to [place] now I will alert Dr. Carlean Purl, our hospital gastroenterologist this week. The patient was told to expect hospitalist admission Will likely need IV steroids, CT abd/pelvis vs CT enterography, exclusion of C diff infection.

## 2016-09-10 NOTE — H&P (Signed)
[facility] History and Physical  Abundio Teuscher Stranahan LEX:[id number] DOB: [birth year] DOA: 09/10/2016   PCP: [name], MD  Specialists: Dr. [name] is his gastroenterologist  Chief Complaint: Loose stools with blood  HPI: Jack [name] is a 51 y.o. male with a past medical history of Crohn's disease, hypertension, who was in his usual state of health until about a month ago when he started having small amounts of blood with stool. He continued to get worse with multiple episodes of bloody stools every day. He had been taking Lomotil to help with this frequency of stools, but he ran out of this medication about 2 days ago. And then his symptoms started getting worse. He's had abdominal discomfort, especially with bowel movements associated with cramps. Currently denies any pain. He's had some nausea but no vomiting. No fever, no chills. He's lost about 10 pounds last few weeks. Denies any dizziness or lightheadedness. Last colonoscopy was in 2016 done at [facility] by his then gastroenterologist. He recently moved to this area and established with gastroenterology here in town. He has been on prednisone and the dose of this was increased about a week ago by Dr. [name] without any relief to his symptoms. Patient went to his gastroenterologist with worsening symptoms and he was referred for hospitalization.  Home Medications: Prior to Admission medications   Medication Sig Start Date End Date Taking? Authorizing Provider  amLODipine (NORVASC) 5 MG tablet TAKE 1 TABLET (5 MG TOTAL) BY MOUTH DAILY. 08/11/16  Yes [name], MD  budesonide-formoterol [facility]) 160-4.5 MCG/ACT inhaler Inhale 2 puffs into the lungs 2 (two) times daily. 02/01/16 02/01/17 Yes Laverle Hobby, MD  Calcium Carbonate-Vit D-Min (CALCIUM 1200 PO) Take 1 capsule by mouth 2 (two) times daily.   Yes [provider]  hydrOXYzine (ATARAX/VISTARIL) 25 MG tablet TAKE UP TO 4 TABLETS BY MOUTH ONCE  DAILY AT BEDTIME AS NEEDED FOR ITCHING. 07/10/16  Yes [provider]  mercaptopurine (PURINETHOL) 50 MG tablet Take 50 mg by mouth daily. Give on an empty stomach 1 hour before or 2 hours after meals. Caution: Chemotherapy.   Yes [provider]  Multiple Vitamin (MULTIVITAMIN) capsule Take 1 capsule by mouth daily.   Yes [provider]  predniSONE (DELTASONE) 20 MG tablet Take 1 tablet (20 mg total) by mouth daily with breakfast. As directed 08/04/16  Yes [name], MD  tadalafil (CIALIS) 5 MG tablet Take 1 tablet (5 mg total) by mouth daily as needed for erectile dysfunction. 12/19/15  Yes [name], MD  tamsulosin (FLOMAX) 0.4 MG CAPS capsule TAKE 1 CAPSULE (0.4 MG TOTAL) BY MOUTH DAILY. 09/09/16  Yes [name], MD  diphenoxylate-atropine (LOMOTIL) 2.5-0.025 MG tablet Take 1-2 tablets by mouth four times daily as needed Patient not taking: Reported on 09/10/2016 09/08/16   Pyrtle, Lajuan Lines, MD  methocarbamol (ROBAXIN) 500 MG tablet Take 1 tablet (500 mg total) by mouth 2 (two) times daily as needed for muscle spasms (sedation precautions). Patient not taking: Reported on 09/10/2016 12/19/15   [name], MD  predniSONE (DELTASONE) 1 MG tablet Take 1 tablet (1 mg total) by mouth daily with breakfast. As directed Patient not taking: Reported on 09/10/2016 08/04/16   [name], MD  predniSONE (DELTASONE) 10 MG tablet Take 12m daily with wean as directed Patient not taking: Reported on 09/10/2016 09/09/16   GRia Bush MD    Allergies:  Allergies  Allergen Reactions  . Cephalexin Nausea And Vomiting    Other Reaction: GI  UPSET  . Duloxetine Other (See Comments)    Headaches   . Remicade [Infliximab]   . Aspirin Hives and Rash    Past Medical History: Past Medical History:  Diagnosis Date  . BPH (benign prostatic hypertrophy)   . Colitis   . Colon polyp    inflammatory  . Crohn disease ([place]) 1992   history uveitis,  involvement of intestines and lungs  . Diverticulosis   . Eosinophilic esophagitis   . Essential hypertension   . History of chicken pox   . History of gastroesophageal reflux (GERD)   . History of seizure 1995   grand mal x1, completed 6 yrs dilantin. no seizures since  . Internal hemorrhoids   . Osteoporosis 11/2015   DEXA T -2.9  . Schwannoma 2007   L axilla s/p surgery  . Ulnar neuropathy    h/o this from L arm schwannoma    Past Surgical History:  Procedure Laterality Date  . APPENDECTOMY  2000  . CARDIOVASCULAR STRESS TEST  01/2015   low risk study  . CHOLECYSTECTOMY  2000  . COLONOSCOPY  08/2014   f/u crohn's, 4 inflammatory polyps, diverticulosis, improved, rpt 2 yrs ([place])  . COLONOSCOPY  01/2015   during hospitalization - active ileitis/colitis  . ESOPHAGOGASTRODUODENOSCOPY  07/2014   h/o EE resolved, focal reflux esophagitis, chronic active gastritis  . extremity surgery Left    ulnar nerve repair after schwannoma removal  . INGUINAL HERNIA REPAIR Bilateral 2017  . LUMBAR SPINE SURGERY  2011  . SPINAL CORD STIMULATOR IMPLANT  2012   x2 (Dr Berton Mount)  . TONSILLECTOMY  1996  . TUMOR REMOVAL Left 2007   schwannoma from L armpit    Social History: Patient lives in [place]. Denies smoking, significant alcohol use or illicit drug use. Independent with daily activities  Family History:  Family History  Problem Relation Age of Onset  . CAD Mother        CABG  . Congenital heart disease Mother   . Hypertension Mother   . Hyperlipidemia Mother   . CAD Father        CABG  . Hypertension Father   . Hyperlipidemia Father   . Cancer Maternal Grandmother        colon  . Diabetes Neg Hx      Review of Systems - History obtained from the patient General ROS: positive for  - fatigue Psychological ROS: negative Ophthalmic ROS: negative ENT ROS: negative Allergy and Immunology ROS: negative Hematological and Lymphatic ROS: negative Endocrine ROS:  negative Respiratory ROS: no cough, shortness of breath, or wheezing Cardiovascular ROS: no chest pain or dyspnea on exertion Gastrointestinal ROS: as in hpi Genito-Urinary ROS: no dysuria, trouble voiding, or hematuria Musculoskeletal ROS: negative Neurological ROS: no TIA or stroke symptoms Dermatological ROS: negative  Physical Examination  Vitals:   09/10/16 1259 09/10/16 1302  BP:  132/87  Pulse:  75  Resp:  18  Temp:  98 F (36.7 C)  TempSrc:  Oral  SpO2:  98%  Weight: 68.9 kg (152 lb)   Height: 5' 7"  (1.702 m)     BP 132/87 (BP Location: Left Arm)   Pulse 75   Temp 98 F (36.7 C) (Oral)   Resp 18   Ht 5' 7"  (1.702 m)   Wt 68.9 kg (152 lb)   SpO2 98%   BMI 23.81 kg/m   General appearance: alert, cooperative, appears stated age and no distress Head: Normocephalic, without obvious abnormality, atraumatic Eyes:  conjunctivae/corneas clear. PERRL, EOM's intact. Throat: lips, mucosa, and tongue normal; teeth and gums normal Neck: no adenopathy, no carotid bruit, no JVD, supple, symmetrical, trachea midline and thyroid not enlarged, symmetric, no tenderness/mass/nodules Resp: clear to auscultation bilaterally Cardio: regular rate and rhythm, S1, S2 normal, no murmur, click, rub or gallop GI: Mildly tender in the lower quadrant on the left. No rebound, rigidity or guarding. Abdomen is otherwise soft. No masses or organomegaly. Bowel sounds are present. Extremities: extremities normal, atraumatic, no cyanosis or edema Pulses: 2+ and symmetric Skin: Skin color, texture, turgor normal. No rashes or lesions Lymph nodes: Cervical, supraclavicular, and axillary nodes normal. Neurologic: Awake and alert. Oriented 3. No focal neurological deficits   Labs on Admission: I have personally reviewed following labs and imaging studies  CBC:  Recent Labs Lab 09/04/16 1104 09/10/16 1333  WBC 14.1* 9.2  NEUTROABS 12.5* 7.8*  HGB 15.1 14.9  HCT 46.1 43.6  MCV 88.8 86.7   PLT 345.0 294   Basic Metabolic Panel:  Recent Labs Lab 09/04/16 1104 09/10/16 1333  NA 138 139  K 4.5 4.2  CL 101 103  CO2 29 26  GLUCOSE 105* 122*  BUN 21 22*  CREATININE 1.24 1.45*  CALCIUM 9.9 8.9   GFR: Estimated Creatinine Clearance: 57.6 mL/min (A) (by C-G formula based on SCr of 1.45 mg/dL (H)). Liver Function Tests:  Recent Labs Lab 09/04/16 1104 09/10/16 1333  AST 13 15  ALT 14 16*  ALKPHOS 66 60  BILITOT 0.7 0.7  PROT 8.1 7.1  ALBUMIN 4.7 3.9    Problem List  Principal Problem:   Exacerbation of Crohn's disease ([place]) Active Problems:   Essential hypertension   Assessment: This is a 51 year old Caucasian male with a past medical history as stated earlier, who presents with a one-month history of worsening diarrhea with rectal bleeding. Patient has a known history of Crohn's disease. He is likely experiencing Crohn's exacerbation.  Plan: #1 Exacerbation of Crohn's disease with hematochezia: Patient is chronically on prednisone. Hemoglobin is stable. Patient to be seen by gastroenterology. They have recommended intravenous steroids, which will be ordered. CT enterography has also been recommended and has been ordered by the ED provider. He'll be kept on full liquids for now. He is otherwise hemodynamically stable. He is noted to be on mercaptopurine, which will be continued.  #2 history of essential hypertension: Continue to monitor blood pressures closely. Hold his amlodipine for now.  #3 Mildly elevated creatinine: Significance is unclear. He'll be given IV fluids. Monitor urine output. Repeat labs tomorrow.  DVT Prophylaxis: SCDs Code Status: Full code Family Communication: Discussed with the patient  Disposition Plan: Likely return home when improved  Consults called: [facility]  Admission status: Inpatient   Severity of Illness: The appropriate patient status for this patient is INPATIENT. Inpatient status is judged to be reasonable  and necessary in order to provide the required intensity of service to ensure the patient's safety. The patient's presenting symptoms, physical exam findings, and initial radiographic and laboratory data in the context of their chronic comorbidities is felt to place them at high risk for further clinical deterioration. Furthermore, it is not anticipated that the patient will be medically stable for discharge from the hospital within 2 midnights of admission. The following factors support the patient status of inpatient.   " The patient's presenting symptoms include diarrhea with rectal bleeding. " The chronic co-morbidities include Crohn's disease.   * I certify that at the point of admission  it is my clinical judgment that the patient will require inpatient hospital care spanning beyond 2 midnights from the point of admission due to high intensity of service, high risk for further deterioration and high frequency of surveillance required.*    Further management decisions will depend on results of further testing and patient's response to treatment.   [facility]  [facility] Pager [phone]  If 7PM-7AM, please contact night-coverage [email or web address] Password [facility]  09/10/2016, 3:24 PM

## 2016-09-10 NOTE — Telephone Encounter (Signed)
In hospital.

## 2016-09-10 NOTE — Telephone Encounter (Signed)
Dr [name] has spoken to patient and sent him to the ER for hospital admission. I have cancelled CT Enterography scheduled for later today. I have also contacted the [facility] nurse to advise that patient is coming to the ER.

## 2016-09-10 NOTE — Telephone Encounter (Signed)
I am seeing him in the hospital

## 2016-09-10 NOTE — Telephone Encounter (Signed)
Aware and will let [name] know

## 2016-09-10 NOTE — Consult Note (Addendum)
Referring Provider: [facility]  Primary Care Physician:  [name], MD Primary Gastroenterologist:  Zenovia Jarred,  MD  Reason for Consultation:  Crohn's disease.   ASSESSMENT AND PLAN:    69. 51 year old male with long-standing Crohn's ileocolitis. Apparently failed Humira and Remicade. He is steroid dependent. On 50 mg 6-MP for last several months. Presenting with flare symptoms.  -admit for IVF -recent C-diff negative, no need to recheck -Will proceed with CT enterography today after we recheck creatinine -Continue IV steroids for now.  -Further recommendations to follow CTE. May need to retry Entyvio.   2. Hx of chronic eosinophilic pneumonia, recurrent. Followed by Pulmonary [facility]. Looks like thought was Entyvio might have side effects causing cough, etc and that is why it was stopped.      [facility] Attending   I have taken an interval history, reviewed the chart and examined the patient. I agree with the Advanced Practitioner's note, impression and recommendations.    Tx w/ steroids, restage w/ imaging maybe colonoscopy - add back Biologic - Entyvio vs other. Increase 6 MP likely.   His lung problems may be a result of Crohn's disease - this type of problem is associated. I do not know if controlling the bowel disease itself reduces pulmonary problems.   Would ike to reduce and elinate steroids but not always possible in chronic eosinophilic pneumonia  Gatha Mayer, MD, [facility] [phone] (pager) [phone] after 5 PM, weekends and holidays  09/10/2016 4:55 PM     HPI: CARDIN NITSCHKE is a 51 y.o. male with a complicated history of Crohn's ileocolitis. Patient relocated here from [place], he recently saw [place] in the office to establish care. Unfortunately records from previous GI were not available, some information was gleaned from [place]. I just spoke with patient's wife who was able to provide additional  and NEW information I didn't have at time of visit.    Patient was initially diagnosed as having UC in [place] remission on Prednisone for two years then remained asymptomatic off steroids for next few years. At some point he developed generalized itching, saw Dermatology, question raised about hypereosinophilia and referred to Hematology for bone marrow bx.  Ultimately referred to Dr. Mikki Santee at [facility]. The patient was not having any GI symptoms at the time but colonoscopy apparently showed severe pancolitis and EGD showed some gastric involvement as well.  He was diagnosed with Crohn's at that time.   Patient tried and  apparently failed Humira. He was changed to Remicade but had a reaction after a few doses. Subsequently restarted on Remicade and continued it for unknown amount of time until efficacy was lost. At that point patient was started on Entyvio. Colonoscopy with biopsies in June 2016 showed improvement. Then patient was hospitalized in November with diarrhea. CT scan revealed abnormal circumferential mural thickening of the transverse, descending and sigmoid colon. Patient underwent an inpatient colonoscopy with biopsies which revealed severely active colitis of the whole colon and rectum with mild ileitis. It should be noted that patient's stool cultures were positive for cannula bacterin Salmonella during this time. He was treated with IV steroids. At discharge patient was started on 6-MP and Entyvio continued. Patient developed problems with recurrent pneumonia and has been followed by pulmonary for eosinophilic pneumonia. Several months ago pulmonary spoke with Dr. Mikki Santee, the decision was made to discontinue Entyvio. Since then patient has been maintained on 50 mg of 6-MP, various doses of prednisone  and Lomotil    At the time of our visit a few weeks ago the patient had been having increasing amounts of diarrhea. . I checked C-diff (negative), requested records from Dr. Mikki Santee,  and made him a follow up appt.  Returned for follow up last week, still no records from previous GI. Our plan was to increase Prednisone to 67m and obtain CTenterography for reevaluation of Crohn's disease.  In the interim patient's condition is deteriorated. He is having frequent episodes of hematochezia. He has some nausea without vomiting. Some mild lower abdominal cramping.    Past Medical History:  Diagnosis Date  . BPH (benign prostatic hypertrophy)   . Colitis   . Colon polyp    inflammatory  . [place]   history uveitis, involvement of intestines and lungs  . Diverticulosis   . Eosinophilic esophagitis   . Essential hypertension   . History of chicken pox   . History of gastroesophageal reflux (GERD)   . History of seizure 1995   grand mal x1, completed 6 yrs dilantin. no seizures since  . Internal hemorrhoids   . Osteoporosis 11/2015   DEXA T -2.9  . Schwannoma 2007   L axilla s/p surgery  . Ulnar neuropathy    h/o this from L arm schwannoma    Past Surgical History:  Procedure Laterality Date  . APPENDECTOMY  2000  . CARDIOVASCULAR STRESS TEST  01/2015   low risk study  . CHOLECYSTECTOMY  2000  . COLONOSCOPY  08/2014   f/u crohn's, 4 inflammatory polyps, diverticulosis, improved, rpt 2 yrs ([facility])  . COLONOSCOPY  01/2015   during hospitalization - active ileitis/colitis  . ESOPHAGOGASTRODUODENOSCOPY  07/2014   h/o EE resolved, focal reflux esophagitis, chronic active gastritis  . extremity surgery Left    ulnar nerve repair after schwannoma removal  . INGUINAL HERNIA REPAIR Bilateral 2017  . LUMBAR SPINE SURGERY  2011  . SPINAL CORD STIMULATOR IMPLANT  2012   x2 (Dr [place] [facility]  . TONSILLECTOMY  1996  . TUMOR REMOVAL Left 2007   schwannoma from L armpit    Prior to Admission medications   Medication Sig Start Date End Date Taking? Authorizing Provider  amLODipine (NORVASC) 5 MG tablet TAKE 1 TABLET (5 MG TOTAL) BY MOUTH DAILY. 08/11/16  Yes  GRia Bush MD  budesonide-formoterol ([facility] 160-4.5 MCG/ACT inhaler Inhale 2 puffs into the lungs 2 (two) times daily. 02/01/16 02/01/17 Yes RLaverle Hobby MD  Calcium Carbonate-Vit D-Min (CALCIUM 1200 PO) Take 1 capsule by mouth 2 (two) times daily.   Yes [provider]  hydrOXYzine (ATARAX/VISTARIL) 25 MG tablet TAKE UP TO 4 TABLETS BY MOUTH ONCE DAILY AT BEDTIME AS NEEDED FOR ITCHING. 07/10/16  Yes [provider]  mercaptopurine (PURINETHOL) 50 MG tablet Take 50 mg by mouth daily. Give on an empty stomach 1 hour before or 2 hours after meals. Caution: Chemotherapy.   Yes [provider]  Multiple Vitamin (MULTIVITAMIN) capsule Take 1 capsule by mouth daily.   Yes [provider]  predniSONE (DELTASONE) 20 MG tablet Take 1 tablet (20 mg total) by mouth daily with breakfast. As directed 08/04/16  Yes GRia Bush MD  tadalafil (CIALIS) 5 MG tablet Take 1 tablet (5 mg total) by mouth daily as needed for erectile dysfunction. 12/19/15  Yes GRia Bush MD  tamsulosin (FLOMAX) 0.4 MG CAPS capsule TAKE 1 CAPSULE (0.4 MG TOTAL) BY MOUTH DAILY. 09/09/16  Yes GRia Bush MD  diphenoxylate-atropine (LOMOTIL) 2.5-0.025 MG tablet  Take 1-2 tablets by mouth four times daily as needed Patient not taking: Reported on 09/10/2016 09/08/16   Pyrtle, Lajuan Lines, MD  methocarbamol (ROBAXIN) 500 MG tablet Take 1 tablet (500 mg total) by mouth 2 (two) times daily as needed for muscle spasms (sedation precautions). Patient not taking: Reported on 09/10/2016 12/19/15   [name], MD  predniSONE (DELTASONE) 1 MG tablet Take 1 tablet (1 mg total) by mouth daily with breakfast. As directed Patient not taking: Reported on 09/10/2016 08/04/16   [name], MD  predniSONE (DELTASONE) 10 MG tablet Take 57m daily with wean as directed Patient not taking: Reported on 09/10/2016 09/09/16   GRia Bush MD    No current facility-administered  medications for this encounter.    Current Outpatient Prescriptions  Medication Sig Dispense Refill  . amLODipine (NORVASC) 5 MG tablet TAKE 1 TABLET (5 MG TOTAL) BY MOUTH DAILY. 30 tablet 5  . budesonide-formoterol (SYMBICORT) 160-4.5 MCG/ACT inhaler Inhale 2 puffs into the lungs 2 (two) times daily. 1 Inhaler 3  . Calcium Carbonate-Vit D-Min (CALCIUM 1200 PO) Take 1 capsule by mouth 2 (two) times daily.    . hydrOXYzine (ATARAX/VISTARIL) 25 MG tablet TAKE UP TO 4 TABLETS BY MOUTH ONCE DAILY AT BEDTIME AS NEEDED FOR ITCHING.  3  . mercaptopurine (PURINETHOL) 50 MG tablet Take 50 mg by mouth daily. Give on an empty stomach 1 hour before or 2 hours after meals. Caution: Chemotherapy.    . Multiple Vitamin (MULTIVITAMIN) capsule Take 1 capsule by mouth daily.    . predniSONE (DELTASONE) 20 MG tablet Take 1 tablet (20 mg total) by mouth daily with breakfast. As directed 30 tablet 1  . tadalafil (CIALIS) 5 MG tablet Take 1 tablet (5 mg total) by mouth daily as needed for erectile dysfunction. 30 tablet 3  . tamsulosin (FLOMAX) 0.4 MG CAPS capsule TAKE 1 CAPSULE (0.4 MG TOTAL) BY MOUTH DAILY. 30 capsule 6  . diphenoxylate-atropine (LOMOTIL) 2.5-0.025 MG tablet Take 1-2 tablets by mouth four times daily as needed (Patient not taking: Reported on 09/10/2016) 240 tablet 2  . methocarbamol (ROBAXIN) 500 MG tablet Take 1 tablet (500 mg total) by mouth 2 (two) times daily as needed for muscle spasms (sedation precautions). (Patient not taking: Reported on 09/10/2016) 30 tablet 0  . predniSONE (DELTASONE) 1 MG tablet Take 1 tablet (1 mg total) by mouth daily with breakfast. As directed (Patient not taking: Reported on 09/10/2016) 100 tablet 1  . predniSONE (DELTASONE) 10 MG tablet Take 433mdaily with wean as directed (Patient not taking: Reported on 09/10/2016) 60 tablet 1    Allergies as of 09/10/2016 - Review Complete 09/10/2016  Allergen Reaction Noted  . Cephalexin Nausea And Vomiting 01/28/2014  .  Duloxetine Other (See Comments) 01/28/2014  . Remicade [infliximab]  03/02/2016  . Aspirin Hives and Rash 01/28/2014    Family History  Problem Relation Age of Onset  . CAD Mother        CABG  . Congenital heart disease Mother   . Hypertension Mother   . Hyperlipidemia Mother   . CAD Father        CABG  . Hypertension Father   . Hyperlipidemia Father   . Cancer Maternal Grandmother        colon  . Diabetes Neg Hx     Social History   Social History  . Marital status: Married    Spouse name: N/A  . Number of children: N/A  . Years of education: N/A  Occupational History  . Not on file.   Social History Main Topics  . Smoking status: Never Smoker  . Smokeless tobacco: Never Used  . Alcohol use Yes     Comment: 1 beer/day  . Drug use: No  . Sexual activity: Not on file   Other Topics Concern  . Not on file   Social History Narrative   Lives with wife, 1 dog. Grown children   Edu: college   Occ: owns mattress store   Activity: active at work, started [employer], wants to restart running   Diet: good water, fruits/vegetables daily    Review of Systems: All systems reviewed and negative except where noted in HPI.  Physical Exam: Vital signs in last 24 hours: Temp:  [98 F (36.7 C)] 98 F (36.7 C) (06/13 1302) Pulse Rate:  [75] 75 (06/13 1302) Resp:  [18] 18 (06/13 1302) BP: (132)/(87) 132/87 (06/13 1302) SpO2:  [98 %] 98 % (06/13 1302) Weight:  [152 lb (68.9 kg)] 152 lb (68.9 kg) (06/13 1259)   General:   Alert, well-developed, white male in NAD Psych:  Pleasant, cooperative. Normal mood and affect. Eyes:  Pupils equal, sclera clear, no icterus.   Conjunctiva pink. Ears:  Normal auditory acuity. Nose:  No deformity, discharge,  or lesions. Neck:  Supple; no masses Lungs:  Clear throughout to auscultation.   No wheezes, crackles, or rhonchi.  Heart:  Regular rate and rhythm; no murmurs, no edema Abdomen:  Soft, non-distended, nontender, BS active, no  palp mass    Rectal:  Deferred  Msk:  Symmetrical without gross deformities. . Pulses:  Normal pulses noted. Neurologic:  Alert and  oriented x4;  grossly normal neurologically. Skin:  Intact without significant lesions or rashes..   Intake/Output from previous day: No intake/output data recorded. Intake/Output this shift: No intake/output data recorded.  Lab Results:  Recent Labs  09/10/16 1333  WBC 9.2  HGB 14.9  HCT 43.6  PLT 305   BMET  Recent Labs  09/10/16 1333  NA 139  K 4.2  CL 103  CO2 26  GLUCOSE 122*  BUN 22*  CREATININE 1.45*  CALCIUM 8.9   LFT  Recent Labs  09/10/16 1333  PROT 7.1  ALBUMIN 3.9  AST 15  ALT 16*  ALKPHOS 60  BILITOT 0.7    Studies/Results: No results found.  Tye Savoy, NP-C @  09/10/2016, 3:26 PM  Pager number [phone]  \

## 2016-09-10 NOTE — ED Provider Notes (Signed)
[facility] DEPT Provider Note   CSN: [id number] Arrival date & time: 09/10/16  1250     History   Chief Complaint Chief Complaint  Patient presents with  . Blood In Stools    HPI Jack [name] is a 51 y.o. male history of hypertension, Crohn's disease on steroids here presenting with persistent diarrhea, blood in stool. Patient has been having blood in the stool for the last several weeks. Also has ongoing diarrhea. Patient has been back and forth from left our GI office and had negative C. difficile in the office as well as stable hemoglobin several days ago. He is scheduled for CT enterography outpatient today but had more pain so came in for evaluation. Also has persistent blood in his stool which is typical of his Crohn's disease. He states that he is on 20 mg of prednisone at baseline but had to be increased to 40 mg but hasn't help with the symptoms. Dr. [name] sent him here for evaluation.     The history is provided by the patient.    Past Medical History:  Diagnosis Date  . BPH (benign prostatic hypertrophy)   . Colitis   . Colon polyp    inflammatory  . Crohn disease ([place]) 1992   history uveitis, involvement of intestines and lungs  . Diverticulosis   . Eosinophilic esophagitis   . Essential hypertension   . History of chicken pox   . History of gastroesophageal reflux (GERD)   . History of seizure 1995   grand mal x1, completed 6 yrs dilantin. no seizures since  . Internal hemorrhoids   . Osteoporosis 11/2015   DEXA T -2.9  . Schwannoma 2007   L axilla s/p surgery  . Ulnar neuropathy    h/o this from L arm schwannoma    Patient Active Problem List   Diagnosis Date Noted  . Defecation urgency 08/04/2016  . Healthcare maintenance 03/18/2016  . Eosinophilic esophagitis   . Neck strain 12/20/2015  . Hypereosinophilic syndrome 00/92/3300  . Essential hypertension   . BPH (benign prostatic hypertrophy)   . Osteoporosis 11/30/2015  . Schwannoma  03/31/2005  . Crohn disease ([place]) 03/31/1990    Past Surgical History:  Procedure Laterality Date  . APPENDECTOMY  2000  . CARDIOVASCULAR STRESS TEST  01/2015   low risk study  . CHOLECYSTECTOMY  2000  . COLONOSCOPY  08/2014   f/u crohn's, 4 inflammatory polyps, diverticulosis, improved, rpt 2 yrs ([facility])  . COLONOSCOPY  01/2015   during hospitalization - active ileitis/colitis  . ESOPHAGOGASTRODUODENOSCOPY  07/2014   h/o EE resolved, focal reflux esophagitis, chronic active gastritis  . extremity surgery Left    ulnar nerve repair after schwannoma removal  . INGUINAL HERNIA REPAIR Bilateral 2017  . LUMBAR SPINE SURGERY  2011  . SPINAL CORD STIMULATOR IMPLANT  2012   x2 (Dr Berton Mount)  . TONSILLECTOMY  1996  . TUMOR REMOVAL Left 2007   schwannoma from L armpit       Home Medications    Prior to Admission medications   Medication Sig Start Date End Date Taking? Authorizing Provider  amLODipine (NORVASC) 5 MG tablet TAKE 1 TABLET (5 MG TOTAL) BY MOUTH DAILY. 08/11/16  Yes [name], MD  budesonide-formoterol [facility]) 160-4.5 MCG/ACT inhaler Inhale 2 puffs into the lungs 2 (two) times daily. 02/01/16 02/01/17 Yes Laverle Hobby, MD  Calcium Carbonate-Vit D-Min (CALCIUM 1200 PO) Take 1 capsule by mouth 2 (two) times daily.   Yes [provider]  hydrOXYzine (ATARAX/VISTARIL) 25 MG tablet TAKE UP TO 4 TABLETS BY MOUTH ONCE DAILY AT BEDTIME AS NEEDED FOR ITCHING. 07/10/16  Yes [provider]  mercaptopurine (PURINETHOL) 50 MG tablet Take 50 mg by mouth daily. Give on an empty stomach 1 hour before or 2 hours after meals. Caution: Chemotherapy.   Yes [provider]  Multiple Vitamin (MULTIVITAMIN) capsule Take 1 capsule by mouth daily.   Yes [provider]  predniSONE (DELTASONE) 20 MG tablet Take 1 tablet (20 mg total) by mouth daily with breakfast. As directed 08/04/16  Yes [name], MD  tadalafil (CIALIS) 5 MG tablet Take  1 tablet (5 mg total) by mouth daily as needed for erectile dysfunction. 12/19/15  Yes [name], MD  tamsulosin (FLOMAX) 0.4 MG CAPS capsule TAKE 1 CAPSULE (0.4 MG TOTAL) BY MOUTH DAILY. 09/09/16  Yes [name], MD  diphenoxylate-atropine (LOMOTIL) 2.5-0.025 MG tablet Take 1-2 tablets by mouth four times daily as needed Patient not taking: Reported on 09/10/2016 09/08/16   Pyrtle, Lajuan Lines, MD  methocarbamol (ROBAXIN) 500 MG tablet Take 1 tablet (500 mg total) by mouth 2 (two) times daily as needed for muscle spasms (sedation precautions). Patient not taking: Reported on 09/10/2016 12/19/15   [name], MD  predniSONE (DELTASONE) 1 MG tablet Take 1 tablet (1 mg total) by mouth daily with breakfast. As directed Patient not taking: Reported on 09/10/2016 08/04/16   [name], MD  predniSONE (DELTASONE) 10 MG tablet Take 75m daily with wean as directed Patient not taking: Reported on 09/10/2016 09/09/16   GRia Bush MD    Family History Family History  Problem Relation Age of Onset  . CAD Mother        CABG  . Congenital heart disease Mother   . Hypertension Mother   . Hyperlipidemia Mother   . CAD Father        CABG  . Hypertension Father   . Hyperlipidemia Father   . Cancer Maternal Grandmother        colon  . Diabetes Neg Hx     Social History Social History  Substance Use Topics  . Smoking status: Never Smoker  . Smokeless tobacco: Never Used  . Alcohol use Yes     Comment: 1 beer/day     Allergies   Cephalexin; Duloxetine; Remicade [infliximab]; and Aspirin   Review of Systems Review of Systems  Gastrointestinal: Positive for blood in stool.  All other systems reviewed and are negative.    Physical Exam Updated Vital Signs BP 132/87 (BP Location: Left Arm)   Pulse 75   Temp 98 F (36.7 C) (Oral)   Resp 18   Ht 5' 7"  (1.702 m)   Wt 68.9 kg (152 lb)   SpO2 98%   BMI 23.81 kg/m   Physical Exam  Constitutional: He is oriented  to person, place, and time. He appears well-developed.  HENT:  Head: Normocephalic.  Mouth/Throat: Oropharynx is clear and moist.  Eyes: Conjunctivae and EOM are normal. Pupils are equal, round, and reactive to light.  Neck: Normal range of motion. Neck supple.  Cardiovascular: Normal rate, regular rhythm and normal heart sounds.   Pulmonary/Chest: Effort normal and breath sounds normal. No respiratory distress. He has no wheezes.  Abdominal: Soft. Bowel sounds are normal. He exhibits no distension. There is no tenderness. There is no guarding.  Musculoskeletal: Normal range of motion.  Neurological: He is alert and oriented to person, place, and time. No cranial nerve deficit. Coordination  normal.  Skin: Skin is warm.  Psychiatric: He has a normal mood and affect.  Nursing note and vitals reviewed.    ED Treatments / Results  Labs (all labs ordered are listed, but only abnormal results are displayed) Labs Reviewed  CBC WITH DIFFERENTIAL/PLATELET - Abnormal; Notable for the following:       Result Value   Neutro Abs 7.8 (*)    All other components within normal limits  COMPREHENSIVE METABOLIC PANEL  I-STAT CG4 LACTIC ACID, ED  TYPE AND SCREEN  ABO/RH    EKG  EKG Interpretation None       Radiology No results found.  Procedures Procedures (including critical care time)  Medications Ordered in ED Medications  sodium chloride 0.9 % bolus 1,000 mL (not administered)  ondansetron (ZOFRAN) injection 4 mg (not administered)  methylPREDNISolone sodium succinate (SOLU-MEDROL) 125 mg/2 mL injection 125 mg (not administered)     Initial Impression / Assessment and Plan / ED Course  I have reviewed the triage vital signs and the nursing notes.  Pertinent labs & imaging results that were available during my care of the patient were reviewed by me and considered in my medical decision making (see chart for details).     Jack [name] is a 51 y.o. male here with  abdominal pain, bloody diarrhea. Hx of Crohn's disease and likely Crohn's flare. I called [facility] and discussed with [name] the PA. She called Dr. [name] and they know him well. They recommend high dose IV steroids. Recommend CT enterography and admission. They will consult.  2:31 PM Hg 14. WBC nl. I doubt C diff. I ordered solumedrol. Hospitalist to admit. GI to see patient and follow up CT enterography.    Final Clinical Impressions(s) / ED Diagnoses   Final diagnoses:  Abdominal pain    New Prescriptions New Prescriptions   No medications on file     Drenda Freeze, MD 09/10/16 1439

## 2016-09-10 NOTE — Telephone Encounter (Signed)
Pt was seen by [facility] 09/08/16. Per [facility] they are investigating benefits regarding Entyvio.

## 2016-09-11 DIAGNOSIS — K50911 Crohn's disease, unspecified, with rectal bleeding: Secondary | ICD-10-CM

## 2016-09-11 DIAGNOSIS — K50011 Crohn's disease of small intestine with rectal bleeding: Secondary | ICD-10-CM

## 2016-09-11 DIAGNOSIS — I10 Essential (primary) hypertension: Secondary | ICD-10-CM

## 2016-09-11 LAB — COMPREHENSIVE METABOLIC PANEL
ALT: 13 U/L — AB (ref 17–63)
AST: 11 U/L — AB (ref 15–41)
Albumin: 3.3 g/dL — ABNORMAL LOW (ref 3.5–5.0)
Alkaline Phosphatase: 53 U/L (ref 38–126)
Anion gap: 8 (ref 5–15)
BILIRUBIN TOTAL: 0.8 mg/dL (ref 0.3–1.2)
BUN: 16 mg/dL (ref 6–20)
CO2: 25 mmol/L (ref 22–32)
CREATININE: 1.19 mg/dL (ref 0.61–1.24)
Calcium: 8.1 mg/dL — ABNORMAL LOW (ref 8.9–10.3)
Chloride: 105 mmol/L (ref 101–111)
Glucose, Bld: 126 mg/dL — ABNORMAL HIGH (ref 65–99)
Potassium: 5 mmol/L (ref 3.5–5.1)
Sodium: 138 mmol/L (ref 135–145)
TOTAL PROTEIN: 6.1 g/dL — AB (ref 6.5–8.1)

## 2016-09-11 LAB — CBC
HCT: 41.7 % (ref 39.0–52.0)
Hemoglobin: 13.7 g/dL (ref 13.0–17.0)
MCH: 29 pg (ref 26.0–34.0)
MCHC: 32.9 g/dL (ref 30.0–36.0)
MCV: 88.2 fL (ref 78.0–100.0)
PLATELETS: 300 10*3/uL (ref 150–400)
RBC: 4.73 MIL/uL (ref 4.22–5.81)
RDW: 14.2 % (ref 11.5–15.5)
WBC: 10.9 10*3/uL — AB (ref 4.0–10.5)

## 2016-09-11 LAB — HIV ANTIBODY (ROUTINE TESTING W REFLEX): HIV Screen 4th Generation wRfx: NONREACTIVE

## 2016-09-11 MED ORDER — HYDROXYZINE HCL 25 MG PO TABS
100.0000 mg | ORAL_TABLET | Freq: Every day | ORAL | Status: DC
  Administered 2016-09-11 – 2016-09-13 (×3): 100 mg via ORAL
  Filled 2016-09-11 (×3): qty 4

## 2016-09-11 MED ORDER — DIPHENOXYLATE-ATROPINE 2.5-0.025 MG PO TABS
2.0000 | ORAL_TABLET | Freq: Every day | ORAL | Status: DC
  Administered 2016-09-11 – 2016-09-14 (×4): 2 via ORAL
  Filled 2016-09-11 (×5): qty 2

## 2016-09-11 MED ORDER — OXYCODONE HCL 5 MG PO TABS: 10.0000 mg | ORAL_TABLET | ORAL | Status: DC | PRN

## 2016-09-11 MED ORDER — DIPHENOXYLATE-ATROPINE 2.5-0.025 MG PO TABS
3.0000 | ORAL_TABLET | Freq: Two times a day (BID) | ORAL | Status: DC
  Administered 2016-09-11: 2 via ORAL
  Filled 2016-09-11: qty 3

## 2016-09-11 MED ORDER — DIPHENOXYLATE-ATROPINE 2.5-0.025 MG PO TABS
3.0000 | ORAL_TABLET | Freq: Two times a day (BID) | ORAL | Status: DC
  Administered 2016-09-11 – 2016-09-14 (×6): 3 via ORAL
  Filled 2016-09-11 (×6): qty 3

## 2016-09-11 MED ORDER — UNJURY CHICKEN SOUP POWDER
8.0000 [oz_av] | ORAL | Status: DC
  Filled 2016-09-11 (×3): qty 27

## 2016-09-11 MED ORDER — METHYLPREDNISOLONE SODIUM SUCC 125 MG IJ SOLR
60.0000 mg | Freq: Two times a day (BID) | INTRAMUSCULAR | Status: DC
  Administered 2016-09-11 – 2016-09-12 (×2): 60 mg via INTRAVENOUS
  Filled 2016-09-11 (×2): qty 2

## 2016-09-11 MED ORDER — BOOST / RESOURCE BREEZE PO LIQD
1.0000 | Freq: Two times a day (BID) | ORAL | Status: DC
  Administered 2016-09-11 – 2016-09-13 (×4): 1 via ORAL
  Administered 2016-09-14: 237 mL via ORAL

## 2016-09-11 MED ORDER — DIPHENOXYLATE-ATROPINE 2.5-0.025 MG PO TABS: 2.0000 | ORAL_TABLET | Freq: Four times a day (QID) | ORAL | Status: DC

## 2016-09-11 MED ORDER — OXYCODONE HCL 5 MG PO TABS: 5.0000 mg | ORAL_TABLET | ORAL | Status: DC | PRN

## 2016-09-11 NOTE — Progress Notes (Signed)
Initial Nutrition Assessment  DOCUMENTATION CODES:   Severe malnutrition in context of acute illness/injury  INTERVENTION:   Provide Boost Breeze po BID, each supplement provides 250 kcal and 9 grams of protein Provide Unjury Chicken Soup once daily, provide 100 kcal and 21g protein  RD to provide   NUTRITION DIAGNOSIS:   Malnutrition related to acute illness, nausea (diarrhea) as evidenced by percent weight loss, energy intake < or equal to 50% for > or equal to 5 days.  GOAL:   Patient will meet greater than or equal to 90% of their needs  MONITOR:   PO intake, Supplement acceptance, Labs, Weight trends, I & O's  REASON FOR ASSESSMENT:   Malnutrition Screening Tool    ASSESSMENT:   51 y.o. male with a past medical history of Crohn's disease, hypertension, who was in his usual state of health until about a month ago when he started having small amounts of blood with stool. He continued to get worse with multiple episodes of bloody stools every day. He had been taking Lomotil to help with this frequency of stools, but he ran out of this medication about 2 days ago. And then his symptoms started getting worse. He's had abdominal discomfort, especially with bowel movements associated with cramps. Currently denies any pain. He's had some nausea but no vomiting. No fever, no chills. He's lost about 10 pounds last few weeks. Denies any dizziness or lightheadedness. Last colonoscopy was in 2016 done at [facility] by his then gastroenterologist. He recently moved to this area and established with gastroenterology here in town. He has been on prednisone and the dose of this was increased about a week ago by Dr. [name] without any relief to his symptoms. Patient went to his gastroenterologist with worsening symptoms and he was referred for hospitalization.  Patient in room with family at bedside. Pt reports he was eating adequately until Saturday when he started having nausea and bloody stools  which worsened. Pt was primarily consuming liquids for the past 5 days, using a protein supplement BID called Isagenix (provides ~250 kcal and 24g protein). Pt has not had anything to eat at all for 2 days. Now on clear liquids. Pt agreeable to trying Boost Breeze and Unjury chicken soup supplements.  Per chart review ,pt has lost 13 lb since 5/25 (8% wt loss x 3 weeks, significant for time frame). Nutrition focused physical exam shows no sign of depletion of muscle mass or body fat.  Labs reviewed. Medications reviewed.   Diet Order:  Diet clear liquid Room service appropriate? Yes; Fluid consistency: Thin  Skin:  Reviewed, no issues  Last BM:  6/13  Height:   Ht Readings from Last 1 Encounters:  09/10/16 5' 7"  (1.702 m)    Weight:   Wt Readings from Last 1 Encounters:  09/11/16 149 lb 7.6 oz (67.8 kg)    Ideal Body Weight:  67.3 kg  BMI:  Body mass index is 23.41 kg/m.  Estimated Nutritional Needs:   Kcal:  1700-1900  Protein:  75-85g  Fluid:  1.9L/day  EDUCATION NEEDS:   No education needs identified at this time  Clayton Bibles, MS, RD, LDN Pager: [phone] After Hours Pager: [phone]

## 2016-09-11 NOTE — Progress Notes (Addendum)
[facility] Progress Note  ASSESSMENT / PLAN:  79. 51 year old male with longstanding Crohn's ileocolitis. CT enterography reveals mainly sigmoid involvement (9cm segment). There is activity in terminal ileum. Hgb down a gram with IVF and bleeding but still normal at 13.7.  -bleeding improved. He is hesitant to take PO because of diarrhea. Lomotil just restarted so will see how he does with PO going forward.  -continue solumedrol , 6MP -When taking adequate PO will d/c IVF (hard for him to get to /from bathroom). -Will resume Entyvio in near future.    2. AKI, resolved with IVF. When taking adequate PO will stop IVF  3. Chronic eosinophilic PNA treated with steroids.Followed by Pulmonary in [place].   4. Liver lesions. Multiple hypodense lesions, largest 1.6 cm.    LOS: 1 day   Tye Savoy ,NP 09/11/2016, 9:16 AM  Pager number [phone]     [facility] Attending   I have taken an interval history, reviewed the chart and examined the patient. I agree with the Advanced Practitioner's note, impression and recommendations.    He is better but may be due to Lomotil. Inflamed sigmoid and ? Terminal ileum on CT-E Was neg C diff 5/22 no Abx in interim Reduce solumedrol to 60 mg q12  Will see if we can start Entyvio here x 1 dose 300 mg IV - depends on availability - generally outpatient med  Liver lesions seem to be cysts and ? Tiny hemangioma - f/u at a later date  Gatha Mayer, MD, [facility] [phone] (pager) [phone] after 5 PM, weekends and holidays  09/11/2016 12:34 PM  Chief Complaint:    Crohn's flare  Subjective: Some lower abdominal pain with BMs last night. Pain and bleeding better this am.   Objective:  Vital signs in last 24 hours: Temp:  [97.6 F (36.4 C)-98 F (36.7 C)] 97.6 F (36.4 C) (06/14 0600) Pulse Rate:  [55-85] 55 (06/14 0600) Resp:  [18-20] 20 (06/14 0600) BP: (122-132)/(70-87) 130/76  (06/14 0600) SpO2:  [98 %-99 %] 98 % (06/14 0600) Weight:  [149 lb 7.6 oz (67.8 kg)-152 lb (68.9 kg)] 149 lb 7.6 oz (67.8 kg) (06/14 0600) Last BM Date: 09/10/16 General:   Alert, well-developed white male in NAD EENT:  Normal hearing, non icteric sclera, conjunctive pink.  Heart:  Regular rate and rhythm; no murmurs no lower extremity edema Pulm: Normal respiratory effort, lungs CTA bilaterally without wheezes or crackles. Abdomen:  Soft, nondistended, nontender.  Normal bowel sounds, no masses felt. Neurologic:  Alert and  oriented x4;  grossly normal neurologically. Psych:  Pleasant, cooperative.  Normal mood and affect.   Intake/Output from previous day: 06/13 0701 - 06/14 0700 In: 600 [I.V.:600] Out: -  Intake/Output this shift: No intake/output data recorded.  Lab Results:  Recent Labs  09/10/16 1333 09/11/16 0355  WBC 9.2 10.9*  HGB 14.9 13.7  HCT 43.6 41.7  PLT 305 300   BMET  Recent Labs  09/10/16 1333 09/10/16 1738 09/11/16 0355  NA 139 141 138  K 4.2 4.8 5.0  CL 103 105 105  CO2 26 28 25   GLUCOSE 122* 120* 126*  BUN 22* 19 16  CREATININE 1.45* 1.19 1.19  CALCIUM 8.9 8.2* 8.1*   LFT  Recent Labs  09/11/16 0355  PROT 6.1*  ALBUMIN 3.3*  AST 11*  ALT 13*  ALKPHOS 53  BILITOT 0.8    Ct Entero Abd/pelvis W Contast  Result Date: 09/10/2016  CLINICAL DATA:  Diarrhea and blood in stool, history of Crohn' s disease. EXAM: CT ABDOMEN AND PELVIS WITH CONTRAST (ENTEROGRAPHY) TECHNIQUE: Multidetector CT of the abdomen and pelvis during bolus administration of intravenous contrast. Negative oral contrast was given. CONTRAST:  159m ISOVUE-300 IOPAMIDOL (ISOVUE-300) INJECTION 61% COMPARISON:  Overlapping portions of CT chest 02/11/2016 FINDINGS: Lower chest:  Unremarkable Hepatobiliary: Multiple hypodense lesions are present in the liver, some of these are thought represent cysts were present on 02/11/2016. Dominant 1.6 cm lesion in the right hepatic lobe on  image 49/6 has some nodular marginal enhancement on the early phase images and unfortunately is mostly excluded on the delayed images, but could represent a hemangioma. I am skeptical that this larger lesion is a cyst. Pancreas: Unremarkable Spleen: Unremarkable Adrenals/Urinary Tract: 1.1 cm and separate 0.8 cm lesions the right kidney upper pole are likely cysts but technically nonspecific. Similar hypodense 6 mm right mid kidney lesion. Adrenal glands normal.  No urinary tract calculi are identified. Stomach/Bowel: The stomach is distended with the negative contrast agent. There collapsed loops of small bowel in addition to loops of small bowel containing the VoLumen oral contrast. There is some high density in the cecum resembling a small amount of conventional oral contrast. There is only a mildly accentuated wall thickening and mucosal enhancement in the terminal most ileum, for example image 33/6. However, there is wall thickening and accentuated mucosal enhancement in the sigmoid colon extending along a approximately 9 cm region for example on image 29/6, likely representing Crohn's colitis and a skip lesion. There is also some involvement of the more proximal sigmoid colon. Admittedly this region is nondistended which can falsely exaggerate wall thickening. There does appear to be some formed stool in the descending colon. No distended small bowel. Vascular/Lymphatic: Aortoiliac atherosclerotic vascular disease. Reproductive: Unremarkable Other: No supplemental non-categorized findings. Musculoskeletal: Dorsal column stimulator at the T12-L1 level. Surgery absent or eroded left lamina at the S1 level through which extends a 3.3 by 2.4 by 2.1 cm primarily fluid density structure on image 54/7, with calcification along its inferior margin and possible continuity with the thecal sac. IMPRESSION: 1. Suspected Crohn's colitis especially involving the sigmoid colon. There is only questionable mucosal enhancement  and wall thickening of the terminal ileum. 2. Multiple hypodense liver lesions. Some of these are likely cysts and were present on 02/11/2016. One of the larger lesions in the right hepatic lobe measuring 1.6 cm in long axis probably has some internal enhancement and could be a hemangioma but is technically nonspecific. If further liver lesion workup is warranted, hepatic protocol MRI with and without contrast would provide greater specificity. 3. Primarily fluid density lesion extending through a left S1 lamina defect, possibly a postoperative site of laminectomy with a pseudomeningocele. Prior port the patient has a history of schwannoma and recurrent schwannoma is not readily excluded although I am skeptical that this represents a schwannoma given the low-density. There is a dorsal column stimulator with leads at the thoracolumbar junction. 4.  Aortic Atherosclerosis (ICD10-I70.0). Electronically Signed   By: WVan ClinesM.D.   On: 09/10/2016 22:22

## 2016-09-11 NOTE — Plan of Care (Signed)
Problem: Safety: Goal: Ability to remain free from injury will improve Outcome: Progressing Safety precaution in place.  Problem: Pain Managment: Goal: General experience of comfort will improve Outcome: Progressing Patient on tramadol PRN

## 2016-09-11 NOTE — Progress Notes (Signed)
PROGRESS NOTE    Jack [name]   SLH:[id number]  DOB: [birth year]  DOA: 09/10/2016 PCP: [name], MD   Brief Narrative:  51 y/o with Crohn's disease, HTN who ran out of his Lomotil and presented to the hosptial for increased frequency of stools and abdominal pain with eating and drinking. He described having to go to the bathroom soon after eating or drinking with cramping pain through out his abdomen. He has been following with Dr [name] and is maintained on Prednisone and 6 Mercaptopurine along with Lomotil. He has previously been treated with Humira (developed a reaction) and Remicaide (eventually lost it's efficacy) and then Entyvio which was discontinued due to possible exacerbation of eosinophilic pneumonia.   Subjective: See above in regards to complaints today.   Assessment & Plan:   Principal Problem:   Exacerbation of Crohn's disease - appreciate GI eval- cont Steroids and Lomotil- clear liquids, advance as tolerated - plans to resume Entyvio per GI  Active Problems: Chronic eosinophilic pneumonia - prednisone appears to be controlling symptoms at a dose of 15 mg daily   AKI - improved- cont IVF until diarrhea improves    Essential hypertension - hold Norvasc  DVT prophylaxis: SCDs Code Status: Full code Family Communication: wife Disposition Plan: home Consultants:   GI Procedures:    Antimicrobials:  Anti-infectives    None       Objective: Vitals:   09/10/16 1651 09/10/16 2052 09/11/16 0600 09/11/16 1450  BP: 129/77 122/70 130/76 135/73  Pulse: (!) 57 (!) 56 (!) 55 64  Resp: 18 18 20 18   Temp: 97.8 F (36.6 C) 97.7 F (36.5 C) 97.6 F (36.4 C) 97.9 F (36.6 C)  TempSrc: Oral Oral Oral Oral  SpO2: 99% 99% 98% 98%  Weight: 68.9 kg (152 lb)  67.8 kg (149 lb 7.6 oz)   Height: 5' 7"  (1.702 m)       Intake/Output Summary (Last 24 hours) at 09/11/16 1453 Last data filed at 09/11/16 1451  Gross per 24 hour  Intake              900  ml  Output                0 ml  Net              900 ml   Filed Weights   09/10/16 1259 09/10/16 1651 09/11/16 0600  Weight: 68.9 kg (152 lb) 68.9 kg (152 lb) 67.8 kg (149 lb 7.6 oz)    Examination: General exam: Appears comfortable  HEENT: PERRLA, oral mucosa moist, no sclera icterus or thrush Respiratory system: Clear to auscultation. Respiratory effort normal. Cardiovascular system: S1 & S2 heard, RRR.  No murmurs  Gastrointestinal system: Abdomen soft,  Tender diffusely, nondistended. Normal bowel sound. No organomegaly Central nervous system: Alert and oriented. No focal neurological deficits. Extremities: No cyanosis, clubbing or edema Skin: No rashes or ulcers Psychiatry:  Mood & affect appropriate.     Data Reviewed: I have personally reviewed following labs and imaging studies  CBC:  Recent Labs Lab 09/10/16 1333 09/11/16 0355  WBC 9.2 10.9*  NEUTROABS 7.8*  --   HGB 14.9 13.7  HCT 43.6 41.7  MCV 86.7 88.2  PLT 305 157   Basic Metabolic Panel:  Recent Labs Lab 09/10/16 1333 09/10/16 1738 09/11/16 0355  NA 139 141 138  K 4.2 4.8 5.0  CL 103 105 105  CO2 26 28 25   GLUCOSE 122* 120* 126*  BUN 22* 19 16  CREATININE 1.45* 1.19 1.19  CALCIUM 8.9 8.2* 8.1*   GFR: Estimated Creatinine Clearance: 70.2 mL/min (by C-G formula based on SCr of 1.19 mg/dL). Liver Function Tests:  Recent Labs Lab 09/10/16 1333 09/11/16 0355  AST 15 11*  ALT 16* 13*  ALKPHOS 60 53  BILITOT 0.7 0.8  PROT 7.1 6.1*  ALBUMIN 3.9 3.3*   No results for input(s): LIPASE, AMYLASE in the last 168 hours. No results for input(s): AMMONIA in the last 168 hours. Coagulation Profile: No results for input(s): INR, PROTIME in the last 168 hours. Cardiac Enzymes: No results for input(s): CKTOTAL, CKMB, CKMBINDEX, TROPONINI in the last 168 hours. BNP (last 3 results) No results for input(s): PROBNP in the last 8760 hours. HbA1C: No results for input(s): HGBA1C in the last 72  hours. CBG: No results for input(s): GLUCAP in the last 168 hours. Lipid Profile: No results for input(s): CHOL, HDL, LDLCALC, TRIG, CHOLHDL, LDLDIRECT in the last 72 hours. Thyroid Function Tests: No results for input(s): TSH, T4TOTAL, FREET4, T3FREE, THYROIDAB in the last 72 hours. Anemia Panel: No results for input(s): VITAMINB12, FOLATE, FERRITIN, TIBC, IRON, RETICCTPCT in the last 72 hours. Urine analysis: No results found for: COLORURINE, APPEARANCEUR, LABSPEC, PHURINE, GLUCOSEU, HGBUR, BILIRUBINUR, KETONESUR, PROTEINUR, UROBILINOGEN, NITRITE, LEUKOCYTESUR Sepsis Labs: @LABRCNTIP (procalcitonin:4,lacticidven:4) )No results found for this or any previous visit (from the past 240 hour(s)).       Radiology Studies: Ct Entero Abd/pelvis W Contast  Result Date: 09/10/2016 CLINICAL DATA:  Diarrhea and blood in stool, history of Crohn' s disease. EXAM: CT ABDOMEN AND PELVIS WITH CONTRAST (ENTEROGRAPHY) TECHNIQUE: Multidetector CT of the abdomen and pelvis during bolus administration of intravenous contrast. Negative oral contrast was given. CONTRAST:  140m ISOVUE-300 IOPAMIDOL (ISOVUE-300) INJECTION 61% COMPARISON:  Overlapping portions of CT chest 02/11/2016 FINDINGS: Lower chest:  Unremarkable Hepatobiliary: Multiple hypodense lesions are present in the liver, some of these are thought represent cysts were present on 02/11/2016. Dominant 1.6 cm lesion in the right hepatic lobe on image 49/6 has some nodular marginal enhancement on the early phase images and unfortunately is mostly excluded on the delayed images, but could represent a hemangioma. I am skeptical that this larger lesion is a cyst. Pancreas: Unremarkable Spleen: Unremarkable Adrenals/Urinary Tract: 1.1 cm and separate 0.8 cm lesions the right kidney upper pole are likely cysts but technically nonspecific. Similar hypodense 6 mm right mid kidney lesion. Adrenal glands normal.  No urinary tract calculi are identified. Stomach/Bowel:  The stomach is distended with the negative contrast agent. There collapsed loops of small bowel in addition to loops of small bowel containing the VoLumen oral contrast. There is some high density in the cecum resembling a small amount of conventional oral contrast. There is only a mildly accentuated wall thickening and mucosal enhancement in the terminal most ileum, for example image 33/6. However, there is wall thickening and accentuated mucosal enhancement in the sigmoid colon extending along a approximately 9 cm region for example on image 29/6, likely representing Crohn's colitis and a skip lesion. There is also some involvement of the more proximal sigmoid colon. Admittedly this region is nondistended which can falsely exaggerate wall thickening. There does appear to be some formed stool in the descending colon. No distended small bowel. Vascular/Lymphatic: Aortoiliac atherosclerotic vascular disease. Reproductive: Unremarkable Other: No supplemental non-categorized findings. Musculoskeletal: Dorsal column stimulator at the T12-L1 level. Surgery absent or eroded left lamina at the S1 level through which extends a 3.3 by 2.4 by  2.1 cm primarily fluid density structure on image 54/7, with calcification along its inferior margin and possible continuity with the thecal sac. IMPRESSION: 1. Suspected Crohn's colitis especially involving the sigmoid colon. There is only questionable mucosal enhancement and wall thickening of the terminal ileum. 2. Multiple hypodense liver lesions. Some of these are likely cysts and were present on 02/11/2016. One of the larger lesions in the right hepatic lobe measuring 1.6 cm in long axis probably has some internal enhancement and could be a hemangioma but is technically nonspecific. If further liver lesion workup is warranted, hepatic protocol MRI with and without contrast would provide greater specificity. 3. Primarily fluid density lesion extending through a left S1 lamina  defect, possibly a postoperative site of laminectomy with a pseudomeningocele. Prior port the patient has a history of schwannoma and recurrent schwannoma is not readily excluded although I am skeptical that this represents a schwannoma given the low-density. There is a dorsal column stimulator with leads at the thoracolumbar junction. 4.  Aortic Atherosclerosis (ICD10-I70.0). Electronically Signed   By: Van Clines M.D.   On: 09/10/2016 22:22      Scheduled Meds: . diphenoxylate-atropine  2 tablet Oral QHS  . diphenoxylate-atropine  3 tablet Oral BID  . feeding supplement  1 Container Oral BID BM  . hydrOXYzine  100 mg Oral QHS  . mercaptopurine  50 mg Oral Daily  . methylPREDNISolone (SOLU-MEDROL) injection  60 mg Intravenous Q12H  . mometasone-formoterol  2 puff Inhalation BID  . protein supplement  8 oz Oral Q24H  . tamsulosin  0.4 mg Oral Daily   Continuous Infusions: . sodium chloride 125 mL/hr at 09/11/16 0954     LOS: 1 day    Time spent in minutes: 35    Debbe Odea, MD [facility] Pager: [email or web address] Password [facility] 09/11/2016, 2:53 PM

## 2016-09-12 LAB — CBC
HEMATOCRIT: 39.7 % (ref 39.0–52.0)
HEMOGLOBIN: 12.8 g/dL — AB (ref 13.0–17.0)
MCH: 28.4 pg (ref 26.0–34.0)
MCHC: 32.2 g/dL (ref 30.0–36.0)
MCV: 88 fL (ref 78.0–100.0)
Platelets: 308 10*3/uL (ref 150–400)
RBC: 4.51 MIL/uL (ref 4.22–5.81)
RDW: 14.3 % (ref 11.5–15.5)
WBC: 12.6 10*3/uL — ABNORMAL HIGH (ref 4.0–10.5)

## 2016-09-12 LAB — BASIC METABOLIC PANEL
Anion gap: 7 (ref 5–15)
BUN: 19 mg/dL (ref 6–20)
CALCIUM: 8 mg/dL — AB (ref 8.9–10.3)
CO2: 25 mmol/L (ref 22–32)
Chloride: 106 mmol/L (ref 101–111)
Creatinine, Ser: 1.13 mg/dL (ref 0.61–1.24)
GFR calc Af Amer: >60 mL/min (ref >60)
GFR calc non Af Amer: >60 mL/min (ref >60)
GLUCOSE: 127 mg/dL — AB (ref 65–99)
Potassium: 4.4 mmol/L (ref 3.5–5.1)
Sodium: 138 mmol/L (ref 135–145)

## 2016-09-12 LAB — THIOPURINE METABOLITES
6-MMPN Metaboilte: 273 pmol/8x 10E8
6-TGN Metabolite: 146 pmol/8x 10E8

## 2016-09-12 LAB — SERIAL MONITORING

## 2016-09-12 MED ORDER — AMLODIPINE BESYLATE 5 MG PO TABS
5.0000 mg | ORAL_TABLET | Freq: Every day | ORAL | Status: DC
  Administered 2016-09-12 – 2016-09-14 (×3): 5 mg via ORAL
  Filled 2016-09-12 (×3): qty 1

## 2016-09-12 MED ORDER — PREDNISONE 20 MG PO TABS
60.0000 mg | ORAL_TABLET | Freq: Every day | ORAL | Status: DC
  Administered 2016-09-13 – 2016-09-14 (×2): 60 mg via ORAL
  Filled 2016-09-12 (×2): qty 3

## 2016-09-12 MED ORDER — SODIUM CHLORIDE 0.9 % IV SOLN
INTRAVENOUS | Status: DC
  Administered 2016-09-12: 10:00:00 via INTRAVENOUS

## 2016-09-12 NOTE — Progress Notes (Signed)
PROGRESS NOTE    Jack [name]   HAL:[id number]  DOB: 1965/08/05  DOA: 09/10/2016 PCP: [name], MD   Brief Narrative:  51 y/o with Crohn's disease, HTN who ran out of his Lomotil and presented to the hosptial for increased frequency of stools and abdominal pain with eating and drinking. He described having to go to the bathroom soon after eating or drinking with cramping pain through out his abdomen. He has been following with Dr [name] and is maintained on Prednisone and 6 Mercaptopurine along with Lomotil. He has previously been treated with Humira (developed a reaction) and Remicaide (eventually lost it's efficacy) and then Entyvio which was discontinued due to possible exacerbation of eosinophilic pneumonia.   Subjective: Diarrhea has improved but not resolved. He is not longer having cramping when eating and drinking. Stool is non-bloody.   Assessment & Plan:   Principal Problem:   Exacerbation of Crohn's disease - appreciate GI eval- cont Steroids and Lomotil- advanced to solid food today- hold IVF  - plans to resume Entyvio per GI as outpt  Active Problems: Chronic eosinophilic pneumonia - prednisone appears to be controlling symptoms at a dose of 15 mg daily   AKI - improved- stop IVF today    Essential hypertension - can resume Norvasc today  DVT prophylaxis: SCDs Code Status: Full code Family Communication: wife Disposition Plan: home Consultants:   GI Procedures:    Antimicrobials:  Anti-infectives    None       Objective: Vitals:   09/11/16 0600 09/11/16 1450 09/11/16 2019 09/12/16 0558  BP: 130/76 135/73 133/89 (!) 155/82  Pulse: (!) 55 64 (!) 56 (!) 53  Resp: 20 18 18 16   Temp: 97.6 F (36.4 C) 97.9 F (36.6 C) 98.4 F (36.9 C) 98 F (36.7 C)  TempSrc: Oral Oral Oral Oral  SpO2: 98% 98% 100% 99%  Weight: 67.8 kg (149 lb 7.6 oz)     Height:        Intake/Output Summary (Last 24 hours) at 09/12/16 1304 Last data filed at  09/11/16 2019  Gross per 24 hour  Intake             1940 ml  Output                0 ml  Net             1940 ml   Filed Weights   09/10/16 1259 09/10/16 1651 09/11/16 0600  Weight: 68.9 kg (152 lb) 68.9 kg (152 lb) 67.8 kg (149 lb 7.6 oz)    Examination: General exam: Appears comfortable  HEENT: PERRLA, oral mucosa moist, no sclera icterus or thrush Respiratory system: Clear to auscultation. Respiratory effort normal. Cardiovascular system: S1 & S2 heard, RRR.  No murmurs  Gastrointestinal system: Abdomen soft,   Less tenderness today, nondistended. Normal bowel sound. No organomegaly Central nervous system: Alert and oriented. No focal neurological deficits. Extremities: No cyanosis, clubbing or edema Skin: No rashes or ulcers Psychiatry:  Mood & affect appropriate.     Data Reviewed: I have personally reviewed following labs and imaging studies  CBC:  Recent Labs Lab 09/10/16 1333 09/11/16 0355 09/12/16 0354  WBC 9.2 10.9* 12.6*  NEUTROABS 7.8*  --   --   HGB 14.9 13.7 12.8*  HCT 43.6 41.7 39.7  MCV 86.7 88.2 88.0  PLT 305 300 735   Basic Metabolic Panel:  Recent Labs Lab 09/10/16 1333 09/10/16 1738 09/11/16 0355 09/12/16 0354  NA  139 141 138 138  K 4.2 4.8 5.0 4.4  CL 103 105 105 106  CO2 26 28 25 25   GLUCOSE 122* 120* 126* 127*  BUN 22* 19 16 19   CREATININE 1.45* 1.19 1.19 1.13  CALCIUM 8.9 8.2* 8.1* 8.0*   GFR: Estimated Creatinine Clearance: 73.9 mL/min (by C-G formula based on SCr of 1.13 mg/dL). Liver Function Tests:  Recent Labs Lab 09/10/16 1333 09/11/16 0355  AST 15 11*  ALT 16* 13*  ALKPHOS 60 53  BILITOT 0.7 0.8  PROT 7.1 6.1*  ALBUMIN 3.9 3.3*   No results for input(s): LIPASE, AMYLASE in the last 168 hours. No results for input(s): AMMONIA in the last 168 hours. Coagulation Profile: No results for input(s): INR, PROTIME in the last 168 hours. Cardiac Enzymes: No results for input(s): CKTOTAL, CKMB, CKMBINDEX, TROPONINI in  the last 168 hours. BNP (last 3 results) No results for input(s): PROBNP in the last 8760 hours. HbA1C: No results for input(s): HGBA1C in the last 72 hours. CBG: No results for input(s): GLUCAP in the last 168 hours. Lipid Profile: No results for input(s): CHOL, HDL, LDLCALC, TRIG, CHOLHDL, LDLDIRECT in the last 72 hours. Thyroid Function Tests: No results for input(s): TSH, T4TOTAL, FREET4, T3FREE, THYROIDAB in the last 72 hours. Anemia Panel: No results for input(s): VITAMINB12, FOLATE, FERRITIN, TIBC, IRON, RETICCTPCT in the last 72 hours. Urine analysis: No results found for: COLORURINE, APPEARANCEUR, LABSPEC, PHURINE, GLUCOSEU, HGBUR, BILIRUBINUR, KETONESUR, PROTEINUR, UROBILINOGEN, NITRITE, LEUKOCYTESUR Sepsis Labs: @LABRCNTIP (procalcitonin:4,lacticidven:4) )No results found for this or any previous visit (from the past 240 hour(s)).       Radiology Studies: Ct Entero Abd/pelvis W Contast  Result Date: 09/10/2016 CLINICAL DATA:  Diarrhea and blood in stool, history of Crohn' s disease. EXAM: CT ABDOMEN AND PELVIS WITH CONTRAST (ENTEROGRAPHY) TECHNIQUE: Multidetector CT of the abdomen and pelvis during bolus administration of intravenous contrast. Negative oral contrast was given. CONTRAST:  140m ISOVUE-300 IOPAMIDOL (ISOVUE-300) INJECTION 61% COMPARISON:  Overlapping portions of CT chest 02/11/2016 FINDINGS: Lower chest:  Unremarkable Hepatobiliary: Multiple hypodense lesions are present in the liver, some of these are thought represent cysts were present on 02/11/2016. Dominant 1.6 cm lesion in the right hepatic lobe on image 49/6 has some nodular marginal enhancement on the early phase images and unfortunately is mostly excluded on the delayed images, but could represent a hemangioma. I am skeptical that this larger lesion is a cyst. Pancreas: Unremarkable Spleen: Unremarkable Adrenals/Urinary Tract: 1.1 cm and separate 0.8 cm lesions the right kidney upper pole are likely cysts  but technically nonspecific. Similar hypodense 6 mm right mid kidney lesion. Adrenal glands normal.  No urinary tract calculi are identified. Stomach/Bowel: The stomach is distended with the negative contrast agent. There collapsed loops of small bowel in addition to loops of small bowel containing the VoLumen oral contrast. There is some high density in the cecum resembling a small amount of conventional oral contrast. There is only a mildly accentuated wall thickening and mucosal enhancement in the terminal most ileum, for example image 33/6. However, there is wall thickening and accentuated mucosal enhancement in the sigmoid colon extending along a approximately 9 cm region for example on image 29/6, likely representing Crohn's colitis and a skip lesion. There is also some involvement of the more proximal sigmoid colon. Admittedly this region is nondistended which can falsely exaggerate wall thickening. There does appear to be some formed stool in the descending colon. No distended small bowel. Vascular/Lymphatic: Aortoiliac atherosclerotic vascular disease. Reproductive:  Unremarkable Other: No supplemental non-categorized findings. Musculoskeletal: Dorsal column stimulator at the T12-L1 level. Surgery absent or eroded left lamina at the S1 level through which extends a 3.3 by 2.4 by 2.1 cm primarily fluid density structure on image 54/7, with calcification along its inferior margin and possible continuity with the thecal sac. IMPRESSION: 1. Suspected Crohn's colitis especially involving the sigmoid colon. There is only questionable mucosal enhancement and wall thickening of the terminal ileum. 2. Multiple hypodense liver lesions. Some of these are likely cysts and were present on 02/11/2016. One of the larger lesions in the right hepatic lobe measuring 1.6 cm in long axis probably has some internal enhancement and could be a hemangioma but is technically nonspecific. If further liver lesion workup is warranted,  hepatic protocol MRI with and without contrast would provide greater specificity. 3. Primarily fluid density lesion extending through a left S1 lamina defect, possibly a postoperative site of laminectomy with a pseudomeningocele. Prior port the patient has a history of schwannoma and recurrent schwannoma is not readily excluded although I am skeptical that this represents a schwannoma given the low-density. There is a dorsal column stimulator with leads at the thoracolumbar junction. 4.  Aortic Atherosclerosis (ICD10-I70.0). Electronically Signed   By: Van Clines M.D.   On: 09/10/2016 22:22      Scheduled Meds: . diphenoxylate-atropine  2 tablet Oral QHS  . diphenoxylate-atropine  3 tablet Oral BID  . feeding supplement  1 Container Oral BID BM  . hydrOXYzine  100 mg Oral QHS  . mercaptopurine  50 mg Oral Daily  . methylPREDNISolone (SOLU-MEDROL) injection  60 mg Intravenous Q12H  . mometasone-formoterol  2 puff Inhalation BID  . protein supplement  8 oz Oral Q24H  . tamsulosin  0.4 mg Oral Daily   Continuous Infusions:    LOS: 2 days    Time spent in minutes: 35    Debbe Odea, MD [facility] Pager: [email or web address] Password [email or web address] 09/12/2016, 1:04 PM

## 2016-09-12 NOTE — Progress Notes (Signed)
     [facility] Progress Note  Chief Complaint:    Crohn's flare  Subjective: A little better. No abdominal pain, no bleeding. Still has post-prandial urge to have a BM though output not much.  He is getting the Lomotil.   Objective:  Vital signs in last 24 hours: Temp:  [97.9 F (36.6 C)-98.4 F (36.9 C)] 98 F (36.7 C) (06/15 0558) Pulse Rate:  [53-64] 53 (06/15 0558) Resp:  [16-18] 16 (06/15 0558) BP: (133-155)/(73-89) 155/82 (06/15 0558) SpO2:  [98 %-100 %] 99 % (06/15 0558) Last BM Date: 09/11/16 General:   Alert, well-developed, white in NAD EENT:  Normal hearing, non icteric sclera, conjunctive pink.  Heart:  Regular rate and rhythm; no murmurs. no lower extremity edema Pulm: Normal respiratory effort, lungs CTA bilaterally without wheezes or crackles. Abdomen:  Soft, nondistended, nontender.  Normal bowel sounds, no masses felt.  Neurologic:  Alert and  oriented x4;  grossly normal neurologically. Psych:  Pleasant, cooperative.  Normal mood and affect.   Intake/Output from previous day: 06/14 0701 - 06/15 0700 In: 1940 [P.O.:940] Out: -  Intake/Output this shift: No intake/output data recorded.  Lab Results:  Recent Labs  09/10/16 1333 09/11/16 0355 09/12/16 0354  WBC 9.2 10.9* 12.6*  HGB 14.9 13.7 12.8*  HCT 43.6 41.7 39.7  PLT 305 300 308   BMET  Recent Labs  09/10/16 1738 09/11/16 0355 09/12/16 0354  NA 141 138 138  K 4.8 5.0 4.4  CL 105 105 106  CO2 28 25 25   GLUCOSE 120* 126* 127*  BUN 19 16 19   CREATININE 1.19 1.19 1.13  CALCIUM 8.2* 8.1* 8.0*   LFT  Recent Labs  09/11/16 0355  PROT 6.1*  ALBUMIN 3.3*  AST 11*  ALT 13*  ALKPHOS 53  BILITOT 0.8   ASSESSMENT / PLAN:   1 Crohn's ileocolitis. CTenterography reveals mainly sigmoid involvement, some ? Activity in terminal ileum.  -Continue solumedrol 15m BID and 633m-I spoke with Pharmacy, EnWeyman Rodneyannot be given inpatient. LiRosanne SackRN at our office is in  process of getting it approved with insurance company -will d/c IV fluids since taking PO fluids -am labs  2. Liver lesions, cysts vrs hemangiomas. Outpatient workup if needed.   3. Eosinophilic pneumonia, followed by Pulmonary. Stable    LOS: 2 days   PaTye SavoyNP 09/12/2016, 9:38 AM  Pager number [phone]  [facility] Attending   I have taken an interval history, reviewed the chart and examined the patient. I agree with the Advanced Practitioner's note, impression and recommendations.    He is getting better though still somewhat ill. I think he'll be able to go home tomorrow on 60 mg prednisone orally. Lomotil when necessary. We had sent a prescription for him before he got admitted so he should have that. Dr. HuBenson Norways on call for [place]. He will see him in less he is discharged prior to that. My office will be making arrangements for him to follow up with Dr. PiElmo Puttnd to [facility] CaGatha MayerMD, [facility] [phone]) [phone] 5 PM, weekends and holidays  09/12/2016 7:19 PM

## 2016-09-13 DIAGNOSIS — N179 Acute kidney failure, unspecified: Secondary | ICD-10-CM

## 2016-09-13 LAB — CBC
HCT: 42.2 % (ref 39.0–52.0)
HEMOGLOBIN: 13.5 g/dL (ref 13.0–17.0)
MCH: 28.9 pg (ref 26.0–34.0)
MCHC: 32 g/dL (ref 30.0–36.0)
MCV: 90.4 fL (ref 78.0–100.0)
Platelets: 304 10*3/uL (ref 150–400)
RBC: 4.67 MIL/uL (ref 4.22–5.81)
RDW: 14.3 % (ref 11.5–15.5)
WBC: 15 10*3/uL — ABNORMAL HIGH (ref 4.0–10.5)

## 2016-09-13 LAB — BASIC METABOLIC PANEL
ANION GAP: 6 (ref 5–15)
BUN: 21 mg/dL — ABNORMAL HIGH (ref 6–20)
CO2: 31 mmol/L (ref 22–32)
Calcium: 8.6 mg/dL — ABNORMAL LOW (ref 8.9–10.3)
Chloride: 103 mmol/L (ref 101–111)
Creatinine, Ser: 1.43 mg/dL — ABNORMAL HIGH (ref 0.61–1.24)
GFR calc Af Amer: >60 mL/min (ref >60)
GFR, EST NON AFRICAN AMERICAN: 56 mL/min — AB (ref >60)
GLUCOSE: 100 mg/dL — AB (ref 65–99)
POTASSIUM: 4.6 mmol/L (ref 3.5–5.1)
Sodium: 140 mmol/L (ref 135–145)

## 2016-09-13 MED ORDER — SODIUM CHLORIDE 0.9 % IV SOLN
INTRAVENOUS | Status: DC
  Administered 2016-09-13 (×2): via INTRAVENOUS

## 2016-09-13 MED ORDER — SODIUM CHLORIDE 0.9 % IV BOLUS (SEPSIS)
1000.0000 mL | Freq: Once | INTRAVENOUS | Status: AC
  Administered 2016-09-13: 1000 mL via INTRAVENOUS

## 2016-09-13 NOTE — Progress Notes (Signed)
Subjective: Bowel movement just now was bloody and he is having some abdominal pain.  Objective: Vital signs in last 24 hours: Temp:  [98 F (36.7 C)-98.6 F (37 C)] 98 F (36.7 C) (06/16 0605) Pulse Rate:  [51-56] 56 (06/16 0605) Resp:  [16-17] 16 (06/16 0605) BP: (139-159)/(83-88) 139/83 (06/16 0605) SpO2:  [98 %-100 %] 100 % (06/16 0605) Last BM Date: 09/12/16  Intake/Output from previous day: 06/15 0701 - 06/16 0700 In: 1725 [P.O.:1725] Out: -  Intake/Output this shift: No intake/output data recorded.  General appearance: alert and no distress Resp: clear to auscultation bilaterally Cardio: regular rate and rhythm GI: minimal abdominal tenderness Extremities: extremities normal, atraumatic, no cyanosis or edema  Lab Results:  Recent Labs  09/11/16 0355 09/12/16 0354 09/13/16 0331  WBC 10.9* 12.6* 15.0*  HGB 13.7 12.8* 13.5  HCT 41.7 39.7 42.2  PLT 300 308 304   BMET  Recent Labs  09/11/16 0355 09/12/16 0354 09/13/16 0331  NA 138 138 140  K 5.0 4.4 4.6  CL 105 106 103  CO2 25 25 31   GLUCOSE 126* 127* 100*  BUN 16 19 21*  CREATININE 1.19 1.13 1.43*  CALCIUM 8.1* 8.0* 8.6*   LFT  Recent Labs  09/11/16 0355  PROT 6.1*  ALBUMIN 3.3*  AST 11*  ALT 13*  ALKPHOS 53  BILITOT 0.8   PT/INR No results for input(s): LABPROT, INR in the last 72 hours. Hepatitis Panel No results for input(s): HEPBSAG, HCVAB, HEPAIGM, HEPBIGM in the last 72 hours. C-Diff No results for input(s): CDIFFTOX in the last 72 hours. Fecal Lactopherrin No results for input(s): FECLLACTOFRN in the last 72 hours.  Studies/Results: No results found.  Medications:  Scheduled: . amLODipine  5 mg Oral Daily  . diphenoxylate-atropine  2 tablet Oral QHS  . diphenoxylate-atropine  3 tablet Oral BID  . feeding supplement  1 Container Oral BID BM  . hydrOXYzine  100 mg Oral QHS  . mercaptopurine  50 mg Oral Daily  . mometasone-formoterol  2 puff Inhalation BID  . predniSONE   60 mg Oral Q breakfast  . protein supplement  8 oz Oral Q24H  . tamsulosin  0.4 mg Oral Daily   Continuous:   Assessment/Plan: 1) Crohn's disease. 2) Hematochezia.   His current bleeding and abdominal pain are not different from his prior baseline of uncontrolled disease.  He is to be sent home on 60 mg of prednisone.  The plan is to initiate Entyvio as soon as possible.  He was previously well-controlled with the medication.  Dr. [name] will arrange for the reinitiation of Entyvio.  Plan: 1) Stable.  Okay to D/C home with prednisone 60 mg as outlined by Dr. Carlean Purl.  LOS: 3 days   Jack [name] D 09/13/2016, 7:34 AM

## 2016-09-13 NOTE — Progress Notes (Signed)
PROGRESS NOTE    Jack [name]   IRC:[id number]  DOB: May 04, 1965  DOA: 09/10/2016 PCP: [name], MD   Brief Narrative:  51 y/o with Crohn's disease, HTN who ran out of his Lomotil and presented to the hosptial for increased frequency of stools and abdominal pain with eating and drinking. He described having to go to the bathroom soon after eating or drinking with cramping pain through out his abdomen. He has been following with Dr [name] and is maintained on Prednisone and 6 Mercaptopurine along with Lomotil. He has previously been treated with Humira (developed a reaction) and Remicaide (eventually lost it's efficacy) and then Entyvio which was discontinued due to possible exacerbation of eosinophilic pneumonia.   Subjective: BM was bloody with clots again this AM and abdominal pain has restarted. He feels the same as he did when he first arrived to the hospital.   Assessment & Plan:   Principal Problem:   Exacerbation of Crohn's disease - appreciate GI eval- cont Steroids and Lomotil- patient has decided to cut back to liquids for now - will need to resume IVF due to recurrence of AKI  - plans to resume Entyvio per GI as outpt  Active Problems: Chronic eosinophilic pneumonia - prednisone appears to be controlling symptoms at a dose of 15 mg daily   AKI - improved but recurred overnight- have given a 1 L bolus of NS and have started continuous fluids    Essential hypertension -  Norvasc    DVT prophylaxis: SCDs Code Status: Full code Family Communication: wife Disposition Plan: home Consultants:   GI Procedures:    Antimicrobials:  Anti-infectives    None       Objective: Vitals:   09/12/16 1413 09/12/16 2029 09/13/16 0605 09/13/16 1027  BP: (!) 141/88 (!) 159/88 139/83 133/79  Pulse: (!) 51 (!) 55 (!) 56 (!) 53  Resp: 17 16 16    Temp: 98.6 F (37 C) 98 F (36.7 C) 98 F (36.7 C)   TempSrc: Oral Oral Oral   SpO2: 98% 98% 100%   Weight:        Height:        Intake/Output Summary (Last 24 hours) at 09/13/16 1237 Last data filed at 09/13/16 1045  Gross per 24 hour  Intake             1375 ml  Output              450 ml  Net              925 ml   Filed Weights   09/10/16 1259 09/10/16 1651 09/11/16 0600  Weight: 68.9 kg (152 lb) 68.9 kg (152 lb) 67.8 kg (149 lb 7.6 oz)    Examination: General exam: Appears comfortable  HEENT: PERRLA, oral mucosa moist, no sclera icterus or thrush Respiratory system: Clear to auscultation. Respiratory effort normal. Cardiovascular system: S1 & S2 heard, RRR.  No murmurs  Gastrointestinal system: Abdomen soft,  Mild tenderness in mid lower abdomen , nondistended. Normal bowel sound. No organomegaly Central nervous system: Alert and oriented. No focal neurological deficits. Extremities: No cyanosis, clubbing or edema Skin: No rashes or ulcers Psychiatry:  Mood & affect appropriate.     Data Reviewed: I have personally reviewed following labs and imaging studies  CBC:  Recent Labs Lab 09/10/16 1333 09/11/16 0355 09/12/16 0354 09/13/16 0331  WBC 9.2 10.9* 12.6* 15.0*  NEUTROABS 7.8*  --   --   --  HGB 14.9 13.7 12.8* 13.5  HCT 43.6 41.7 39.7 42.2  MCV 86.7 88.2 88.0 90.4  PLT 305 300 308 756   Basic Metabolic Panel:  Recent Labs Lab 09/10/16 1333 09/10/16 1738 09/11/16 0355 09/12/16 0354 09/13/16 0331  NA 139 141 138 138 140  K 4.2 4.8 5.0 4.4 4.6  CL 103 105 105 106 103  CO2 26 28 25 25 31   GLUCOSE 122* 120* 126* 127* 100*  BUN 22* 19 16 19  21*  CREATININE 1.45* 1.19 1.19 1.13 1.43*  CALCIUM 8.9 8.2* 8.1* 8.0* 8.6*   GFR: Estimated Creatinine Clearance: 58.4 mL/min (A) (by C-G formula based on SCr of 1.43 mg/dL (H)). Liver Function Tests:  Recent Labs Lab 09/10/16 1333 09/11/16 0355  AST 15 11*  ALT 16* 13*  ALKPHOS 60 53  BILITOT 0.7 0.8  PROT 7.1 6.1*  ALBUMIN 3.9 3.3*   No results for input(s): LIPASE, AMYLASE in the last 168 hours. No results  for input(s): AMMONIA in the last 168 hours. Coagulation Profile: No results for input(s): INR, PROTIME in the last 168 hours. Cardiac Enzymes: No results for input(s): CKTOTAL, CKMB, CKMBINDEX, TROPONINI in the last 168 hours. BNP (last 3 results) No results for input(s): PROBNP in the last 8760 hours. HbA1C: No results for input(s): HGBA1C in the last 72 hours. CBG: No results for input(s): GLUCAP in the last 168 hours. Lipid Profile: No results for input(s): CHOL, HDL, LDLCALC, TRIG, CHOLHDL, LDLDIRECT in the last 72 hours. Thyroid Function Tests: No results for input(s): TSH, T4TOTAL, FREET4, T3FREE, THYROIDAB in the last 72 hours. Anemia Panel: No results for input(s): VITAMINB12, FOLATE, FERRITIN, TIBC, IRON, RETICCTPCT in the last 72 hours. Urine analysis: No results found for: COLORURINE, APPEARANCEUR, LABSPEC, PHURINE, GLUCOSEU, HGBUR, BILIRUBINUR, KETONESUR, PROTEINUR, UROBILINOGEN, NITRITE, LEUKOCYTESUR Sepsis Labs: @LABRCNTIP (procalcitonin:4,lacticidven:4) )No results found for this or any previous visit (from the past 240 hour(s)).       Radiology Studies: No results found.    Scheduled Meds: . amLODipine  5 mg Oral Daily  . diphenoxylate-atropine  2 tablet Oral QHS  . diphenoxylate-atropine  3 tablet Oral BID  . feeding supplement  1 Container Oral BID BM  . hydrOXYzine  100 mg Oral QHS  . mercaptopurine  50 mg Oral Daily  . mometasone-formoterol  2 puff Inhalation BID  . predniSONE  60 mg Oral Q breakfast  . protein supplement  8 oz Oral Q24H  . tamsulosin  0.4 mg Oral Daily   Continuous Infusions: . sodium chloride 75 mL/hr at 09/13/16 1029     LOS: 3 days    Time spent in minutes: 35    Debbe Odea, MD [facility] Pager: [email or web address] Password [email or web address] 09/13/2016, 12:37 PM

## 2016-09-14 DIAGNOSIS — J82 Pulmonary eosinophilia, not elsewhere classified: Secondary | ICD-10-CM

## 2016-09-14 LAB — BASIC METABOLIC PANEL
ANION GAP: 5 (ref 5–15)
BUN: 21 mg/dL — ABNORMAL HIGH (ref 6–20)
CHLORIDE: 103 mmol/L (ref 101–111)
CO2: 31 mmol/L (ref 22–32)
Calcium: 8.3 mg/dL — ABNORMAL LOW (ref 8.9–10.3)
Creatinine, Ser: 1.24 mg/dL (ref 0.61–1.24)
GFR calc non Af Amer: >60 mL/min (ref >60)
Glucose, Bld: 96 mg/dL (ref 65–99)
Potassium: 4.2 mmol/L (ref 3.5–5.1)
Sodium: 139 mmol/L (ref 135–145)

## 2016-09-14 MED ORDER — PREDNISONE 20 MG PO TABS: 60.0000 mg | ORAL_TABLET | Freq: Every day | ORAL | 0 refills | Status: DC

## 2016-09-14 MED ORDER — DICYCLOMINE HCL 10 MG PO CAPS: 10.0000 mg | ORAL_CAPSULE | Freq: Four times a day (QID) | ORAL | 0 refills | Status: DC | PRN

## 2016-09-14 MED ORDER — OXYCODONE HCL 5 MG PO TABS: 5.0000 mg | ORAL_TABLET | ORAL | 0 refills | Status: DC | PRN

## 2016-09-14 NOTE — Progress Notes (Signed)
Progress Note for [facility]  Subjective: No complaints.  Diarrhea yesterday with intermittent hematochezia.  Objective: Vital signs in last 24 hours: Temp:  [97.9 F (36.6 C)-98.5 F (36.9 C)] 98.2 F (36.8 C) (06/17 0542) Pulse Rate:  [49-56] 49 (06/17 0542) Resp:  [18-20] 18 (06/17 0542) BP: (130-151)/(71-93) 130/71 (06/17 0542) SpO2:  [97 %-100 %] 98 % (06/17 0542) Last BM Date: 09/13/16  Intake/Output from previous day: 06/16 0701 - 06/17 0700 In: 480 [P.O.:480] Out: 2750 [Urine:2750] Intake/Output this shift: No intake/output data recorded.  General appearance: alert and no distress GI: soft, non-tender; bowel sounds normal; no masses,  no organomegaly  Lab Results:  Recent Labs  09/12/16 0354 09/13/16 0331  WBC 12.6* 15.0*  HGB 12.8* 13.5  HCT 39.7 42.2  PLT 308 304   BMET  Recent Labs  09/12/16 0354 09/13/16 0331 09/14/16 0358  NA 138 140 139  K 4.4 4.6 4.2  CL 106 103 103  CO2 25 31 31   GLUCOSE 127* 100* 96  BUN 19 21* 21*  CREATININE 1.13 1.43* 1.24  CALCIUM 8.0* 8.6* 8.3*   LFT No results for input(s): PROT, ALBUMIN, AST, ALT, ALKPHOS, BILITOT, BILIDIR, IBILI in the last 72 hours. PT/INR No results for input(s): LABPROT, INR in the last 72 hours. Hepatitis Panel No results for input(s): HEPBSAG, HCVAB, HEPAIGM, HEPBIGM in the last 72 hours. C-Diff No results for input(s): CDIFFTOX in the last 72 hours. Fecal Lactopherrin No results for input(s): FECLLACTOFRN in the last 72 hours.  Studies/Results: No results found.  Medications:  Scheduled: . amLODipine  5 mg Oral Daily  . diphenoxylate-atropine  2 tablet Oral QHS  . diphenoxylate-atropine  3 tablet Oral BID  . feeding supplement  1 Container Oral BID BM  . hydrOXYzine  100 mg Oral QHS  . mercaptopurine  50 mg Oral Daily  . mometasone-formoterol  2 puff Inhalation BID  . predniSONE  60 mg Oral Q breakfast  . protein supplement  8 oz Oral Q24H  . tamsulosin  0.4 mg Oral Daily    Continuous: . sodium chloride 75 mL/hr at 09/13/16 2248    Assessment/Plan: 1) Crohn's disease. 2) Renal insufficiency - iimproved.   The patient had a bowel movement that I was able to evaluate.  It is a liquid bowel movement with some blood.  Oral intake is not a problem for him to maintain hydration.  Plan: 1) Entyvio as an outpatient. 2) Okay to D/C home. 3) Follow up with Dr. [name].  LOS: 4 days   Arilynn Blakeney D 09/14/2016, 7:08 AM

## 2016-09-14 NOTE — Discharge Summary (Signed)
Physician Discharge Summary  Jack [name] HCW:[id number] DOB: 1965/12/06 DOA: 09/10/2016  PCP: Jack Bush, MD  Admit date: 09/10/2016 Discharge date: 09/14/2016  Admitted From: home  Disposition:  home   Recommendations for Outpatient Follow-up:  1. F/u on Bmet/ renal function, Hemoglobin- may need Iron if he continues to have chronic blood loss  Discharge Condition:  stable   CODE STATUS:  Full code   Consultations:  GI    Discharge Diagnoses:   Principal Problem:   Exacerbation of Crohn's disease   Active Problems:   AKI  Chronic eosinophilic pneumonia    Essential hypertension  Subjective: Continues to have diarrhea, abdominal pain and occasional bloody stools  Brief Summary: 51 y/o with Crohn's disease, HTN who ran out of his Lomotil and presented to the hosptial for increased frequency of stools and abdominal pain with eating and drinking. He described having to go to the bathroom soon after eating or drinking with cramping pain through out his abdomen. He has been following with Dr [name] and is maintained on Prednisone and 6 Mercaptopurine along with Lomotil. He has previously been treated with Humira (developed a reaction) and Remicaide (eventually lost it's efficacy) and then Entyvio which was discontinued due to possible exacerbation of eosinophilic pneumonia.   Hospital Course:  Principal Problem:   Exacerbation of Crohn's disease - appreciate GI eval- cont Steroids and Lomotil- has been weaned to Prednisone which he will continue until he is able to receive Entyvio as outpt  Active Problems: Chronic eosinophilic pneumonia - prednisone appears to be controlling symptoms at a dose of 15 mg daily   AKI - improved with aggressive hydration- the patient is aware to continue to hydrate well at home    Essential hypertension -  Norvasc    Discharge Instructions  Discharge Instructions    Diet - low sodium heart healthy    Complete by:  As  directed    Increase activity slowly    Complete by:  As directed      Allergies as of 09/14/2016      Reactions   Cephalexin Nausea And Vomiting   Other Reaction: GI UPSET   Duloxetine Other (See Comments)   Headaches   Remicade [infliximab]    Aspirin Hives, Rash      Medication List    TAKE these medications   amLODipine 5 MG tablet Commonly known as:  NORVASC TAKE 1 TABLET (5 MG TOTAL) BY MOUTH DAILY.   budesonide-formoterol 160-4.5 MCG/ACT inhaler Commonly known as:  SYMBICORT Inhale 2 puffs into the lungs 2 (two) times daily.   CALCIUM 1200 PO Take 1 capsule by mouth 2 (two) times daily.   dicyclomine 10 MG capsule Commonly known as:  BENTYL Take 1 capsule (10 mg total) by mouth every 6 (six) hours as needed for spasms (cramping).   diphenoxylate-atropine 2.5-0.025 MG tablet Commonly known as:  LOMOTIL Take 1-2 tablets by mouth four times daily as needed   hydrOXYzine 25 MG tablet Commonly known as:  ATARAX/VISTARIL TAKE UP TO 4 TABLETS BY MOUTH ONCE DAILY AT BEDTIME AS NEEDED FOR ITCHING.   mercaptopurine 50 MG tablet Commonly known as:  PURINETHOL Take 50 mg by mouth daily. Give on an empty stomach 1 hour before or 2 hours after meals. Caution: Chemotherapy.   methocarbamol 500 MG tablet Commonly known as:  ROBAXIN Take 1 tablet (500 mg total) by mouth 2 (two) times daily as needed for muscle spasms (sedation precautions).   multivitamin capsule Take 1 capsule  by mouth daily.   oxyCODONE 5 MG immediate release tablet Commonly known as:  Oxy IR/ROXICODONE Take 1 tablet (5 mg total) by mouth every 4 (four) hours as needed for moderate pain.   predniSONE 20 MG tablet Commonly known as:  DELTASONE Take 3 tablets (60 mg total) by mouth daily with breakfast. What changed:  how much to take  additional instructions  Another medication with the same name was removed. Continue taking this medication, and follow the directions you see here.   tadalafil  5 MG tablet Commonly known as:  CIALIS Take 1 tablet (5 mg total) by mouth daily as needed for erectile dysfunction.   tamsulosin 0.4 MG Caps capsule Commonly known as:  FLOMAX TAKE 1 CAPSULE (0.4 MG TOTAL) BY MOUTH DAILY.       Allergies  Allergen Reactions  . Cephalexin Nausea And Vomiting    Other Reaction: GI UPSET  . Duloxetine Other (See Comments)    Headaches   . Remicade [Infliximab]   . Aspirin Hives and Rash     Procedures/Studies:   Dg Ankle 2 Views Left  Result Date: 08/22/2016 CLINICAL DATA:  Chronic steroid use, pain and swelling LEFT ankle for 2 weeks, popped while running 2 weeks ago and has been painful since EXAM: LEFT ANKLE - 2 VIEW COMPARISON:  None FINDINGS: Osseous demineralization. Joint spaces preserved. Mild soft tissue swelling predominantly anteriorly and medially. No acute fracture, dislocation, or bone destruction. IMPRESSION: Soft tissue swelling without acute bony abnormalities. Electronically Signed   By: Lavonia Dana M.D.   On: 08/22/2016 17:54   Ct Entero Abd/pelvis W Contast  Result Date: 09/10/2016 CLINICAL DATA:  Diarrhea and blood in stool, history of Crohn' s disease. EXAM: CT ABDOMEN AND PELVIS WITH CONTRAST (ENTEROGRAPHY) TECHNIQUE: Multidetector CT of the abdomen and pelvis during bolus administration of intravenous contrast. Negative oral contrast was given. CONTRAST:  176m ISOVUE-300 IOPAMIDOL (ISOVUE-300) INJECTION 61% COMPARISON:  Overlapping portions of CT chest 02/11/2016 FINDINGS: Lower chest:  Unremarkable Hepatobiliary: Multiple hypodense lesions are present in the liver, some of these are thought represent cysts were present on 02/11/2016. Dominant 1.6 cm lesion in the right hepatic lobe on image 49/6 has some nodular marginal enhancement on the early phase images and unfortunately is mostly excluded on the delayed images, but could represent a hemangioma. I am skeptical that this larger lesion is a cyst. Pancreas: Unremarkable  Spleen: Unremarkable Adrenals/Urinary Tract: 1.1 cm and separate 0.8 cm lesions the right kidney upper pole are likely cysts but technically nonspecific. Similar hypodense 6 mm right mid kidney lesion. Adrenal glands normal.  No urinary tract calculi are identified. Stomach/Bowel: The stomach is distended with the negative contrast agent. There collapsed loops of small bowel in addition to loops of small bowel containing the VoLumen oral contrast. There is some high density in the cecum resembling a small amount of conventional oral contrast. There is only a mildly accentuated wall thickening and mucosal enhancement in the terminal most ileum, for example image 33/6. However, there is wall thickening and accentuated mucosal enhancement in the sigmoid colon extending along a approximately 9 cm region for example on image 29/6, likely representing Crohn's colitis and a skip lesion. There is also some involvement of the more proximal sigmoid colon. Admittedly this region is nondistended which can falsely exaggerate wall thickening. There does appear to be some formed stool in the descending colon. No distended small bowel. Vascular/Lymphatic: Aortoiliac atherosclerotic vascular disease. Reproductive: Unremarkable Other: No supplemental non-categorized findings. Musculoskeletal:  Dorsal column stimulator at the T12-L1 level. Surgery absent or eroded left lamina at the S1 level through which extends a 3.3 by 2.4 by 2.1 cm primarily fluid density structure on image 54/7, with calcification along its inferior margin and possible continuity with the thecal sac. IMPRESSION: 1. Suspected Crohn's colitis especially involving the sigmoid colon. There is only questionable mucosal enhancement and wall thickening of the terminal ileum. 2. Multiple hypodense liver lesions. Some of these are likely cysts and were present on 02/11/2016. One of the larger lesions in the right hepatic lobe measuring 1.6 cm in long axis probably has some  internal enhancement and could be a hemangioma but is technically nonspecific. If further liver lesion workup is warranted, hepatic protocol MRI with and without contrast would provide greater specificity. 3. Primarily fluid density lesion extending through a left S1 lamina defect, possibly a postoperative site of laminectomy with a pseudomeningocele. Prior port the patient has a history of schwannoma and recurrent schwannoma is not readily excluded although I am skeptical that this represents a schwannoma given the low-density. There is a dorsal column stimulator with leads at the thoracolumbar junction. 4.  Aortic Atherosclerosis (ICD10-I70.0). Electronically Signed   By: Van Clines M.D.   On: 09/10/2016 22:22        Discharge Exam: Vitals:   09/13/16 2129 09/14/16 0542  BP: (!) 151/93 130/71  Pulse: (!) 56 (!) 49  Resp: 20 18  Temp: 98.5 F (36.9 C) 98.2 F (36.8 C)   Vitals:   09/13/16 1027 09/13/16 1351 09/13/16 2129 09/14/16 0542  BP: 133/79 (!) 148/93 (!) 151/93 130/71  Pulse: (!) 53 (!) 53 (!) 56 (!) 49  Resp:  18 20 18   Temp:  97.9 F (36.6 C) 98.5 F (36.9 C) 98.2 F (36.8 C)  TempSrc:  Oral Oral Oral  SpO2:  100% 97% 98%  Weight:      Height:        General: Pt is alert, awake, not in acute distress Cardiovascular: RRR, S1/S2 +, no rubs, no gallops Respiratory: CTA bilaterally, no wheezing, no rhonchi Abdominal: Soft, NT, ND, bowel sounds + Extremities: no edema, no cyanosis    The results of significant diagnostics from this hospitalization (including imaging, microbiology, ancillary and laboratory) are listed below for reference.     Microbiology: No results found for this or any previous visit (from the past 240 hour(s)).   Labs: BNP (last 3 results) No results for input(s): BNP in the last 8760 hours. Basic Metabolic Panel:  Recent Labs Lab 09/10/16 1738 09/11/16 0355 09/12/16 0354 09/13/16 0331 09/14/16 0358  NA 141 138 138 140 139  K  4.8 5.0 4.4 4.6 4.2  CL 105 105 106 103 103  CO2 28 25 25 31 31   GLUCOSE 120* 126* 127* 100* 96  BUN 19 16 19  21* 21*  CREATININE 1.19 1.19 1.13 1.43* 1.24  CALCIUM 8.2* 8.1* 8.0* 8.6* 8.3*   Liver Function Tests:  Recent Labs Lab 09/10/16 1333 09/11/16 0355  AST 15 11*  ALT 16* 13*  ALKPHOS 60 53  BILITOT 0.7 0.8  PROT 7.1 6.1*  ALBUMIN 3.9 3.3*   No results for input(s): LIPASE, AMYLASE in the last 168 hours. No results for input(s): AMMONIA in the last 168 hours. CBC:  Recent Labs Lab 09/10/16 1333 09/11/16 0355 09/12/16 0354 09/13/16 0331  WBC 9.2 10.9* 12.6* 15.0*  NEUTROABS 7.8*  --   --   --   HGB 14.9 13.7 12.8* 13.5  HCT 43.6 41.7 39.7 42.2  MCV 86.7 88.2 88.0 90.4  PLT 305 300 308 304   Cardiac Enzymes: No results for input(s): CKTOTAL, CKMB, CKMBINDEX, TROPONINI in the last 168 hours. BNP: Invalid input(s): POCBNP CBG: No results for input(s): GLUCAP in the last 168 hours. D-Dimer No results for input(s): DDIMER in the last 72 hours. Hgb A1c No results for input(s): HGBA1C in the last 72 hours. Lipid Profile No results for input(s): CHOL, HDL, LDLCALC, TRIG, CHOLHDL, LDLDIRECT in the last 72 hours. Thyroid function studies No results for input(s): TSH, T4TOTAL, T3FREE, THYROIDAB in the last 72 hours.  Invalid input(s): FREET3 Anemia work up No results for input(s): VITAMINB12, FOLATE, FERRITIN, TIBC, IRON, RETICCTPCT in the last 72 hours. Urinalysis No results found for: COLORURINE, APPEARANCEUR, LABSPEC, [place], GLUCOSEU, HGBUR, BILIRUBINUR, KETONESUR, PROTEINUR, UROBILINOGEN, NITRITE, LEUKOCYTESUR Sepsis Labs Invalid input(s): PROCALCITONIN,  WBC,  LACTICIDVEN Microbiology No results found for this or any previous visit (from the past 240 hour(s)).   Time coordinating discharge: Over 30 minutes  SIGNED:   Debbe Odea, MD  [facility] 09/14/2016, 11:38 AM Pager   If 7PM-7AM, please contact  night-coverage [email or web address] Password [email or web address]

## 2016-09-14 NOTE — Progress Notes (Signed)
D/C home .Verbalized  Understanding of instructions and prescription to pick up

## 2016-09-15 ENCOUNTER — Telehealth: Payer: Self-pay | Admitting: Internal Medicine

## 2016-09-15 ENCOUNTER — Other Ambulatory Visit: Payer: Self-pay

## 2016-09-15 MED ORDER — MERCAPTOPURINE 50 MG PO TABS: 100.0000 mg | ORAL_TABLET | Freq: Every day | ORAL | 3 refills | Status: DC

## 2016-09-15 NOTE — Telephone Encounter (Signed)
Spoke with pt and let him know Jack [name] is working on this and per [employer] it has been approved. Requested Jack Call pt and speak with him to let him know if he needs to speak with anyone in particular.

## 2016-09-15 NOTE — Telephone Encounter (Signed)
Pt knows the contact information for the office and will receive his first Entyvio infusion Wednesday at 2pm.

## 2016-10-02 ENCOUNTER — Ambulatory Visit: Payer: BLUE CROSS/BLUE SHIELD | Admitting: Internal Medicine

## 2016-10-31 ENCOUNTER — Other Ambulatory Visit: Payer: Self-pay

## 2016-10-31 MED ORDER — HYDROXYZINE HCL 25 MG PO TABS: ORAL_TABLET | ORAL | 3 refills | Status: DC

## 2016-10-31 NOTE — Telephone Encounter (Signed)
Pt's wife left v/m requesting refill for hydroxyzine to [facility] [place]. Last refilled # 90 x 3 on 12/19/15. Last seen 08/04/16. Pt will be out of med 11/01/16 and request refill done 10/31/16. Pt uses med for itching or Crohn's disease.Please advise.

## 2016-11-26 ENCOUNTER — Other Ambulatory Visit: Payer: Self-pay | Admitting: Family Medicine

## 2016-11-28 ENCOUNTER — Other Ambulatory Visit: Payer: Self-pay | Admitting: Family Medicine

## 2016-11-28 NOTE — Telephone Encounter (Signed)
Pt called requesting refill hydroxyzine; was refilled 10/31/16 # 30 x 3 to [facility] . I spoke with Elmyra Ricks at [place] and she found the 10/31/16 rx and will get ready for pick up today around 2 pm. Pt voiced understanding.

## 2016-12-03 ENCOUNTER — Other Ambulatory Visit (INDEPENDENT_AMBULATORY_CARE_PROVIDER_SITE_OTHER): Payer: BLUE CROSS/BLUE SHIELD

## 2016-12-03 ENCOUNTER — Encounter: Payer: Self-pay | Admitting: Internal Medicine

## 2016-12-03 ENCOUNTER — Ambulatory Visit (INDEPENDENT_AMBULATORY_CARE_PROVIDER_SITE_OTHER): Payer: BLUE CROSS/BLUE SHIELD | Admitting: Internal Medicine

## 2016-12-03 VITALS — BP 120/82 | HR 74 | Ht 67.0 in | Wt 169.4 lb

## 2016-12-03 DIAGNOSIS — Z7952 Long term (current) use of systemic steroids: Secondary | ICD-10-CM | POA: Diagnosis not present

## 2016-12-03 DIAGNOSIS — J8281 Chronic eosinophilic pneumonia: Secondary | ICD-10-CM

## 2016-12-03 DIAGNOSIS — K5080 Crohn's disease of both small and large intestine without complications: Secondary | ICD-10-CM

## 2016-12-03 DIAGNOSIS — J82 Pulmonary eosinophilia, not elsewhere classified: Secondary | ICD-10-CM | POA: Diagnosis not present

## 2016-12-03 LAB — CBC WITH DIFFERENTIAL/PLATELET
BASOS PCT: 0.4 % (ref 0.0–3.0)
Basophils Absolute: 0.1 10*3/uL (ref 0.0–0.1)
EOS ABS: 0.1 10*3/uL (ref 0.0–0.7)
Eosinophils Relative: 0.5 % (ref 0.0–5.0)
HEMATOCRIT: 44 % (ref 39.0–52.0)
HEMOGLOBIN: 14.1 g/dL (ref 13.0–17.0)
LYMPHS PCT: 5.9 % — AB (ref 12.0–46.0)
Lymphs Abs: 0.8 10*3/uL (ref 0.7–4.0)
MCHC: 32 g/dL (ref 30.0–36.0)
MCV: 96.5 fl (ref 78.0–100.0)
Monocytes Absolute: 0.5 10*3/uL (ref 0.1–1.0)
Monocytes Relative: 3.5 % (ref 3.0–12.0)
Neutro Abs: 12.2 10*3/uL — ABNORMAL HIGH (ref 1.4–7.7)
Neutrophils Relative %: 89.7 % — ABNORMAL HIGH (ref 43.0–77.0)
Platelets: 287 10*3/uL (ref 150.0–400.0)
RBC: 4.56 Mil/uL (ref 4.22–5.81)
RDW: 17.7 % — AB (ref 11.5–15.5)
WBC: 13.6 10*3/uL — AB (ref 4.0–10.5)

## 2016-12-03 LAB — COMPREHENSIVE METABOLIC PANEL
ALBUMIN: 4.2 g/dL (ref 3.5–5.2)
ALK PHOS: 39 U/L (ref 39–117)
ALT: 16 U/L (ref 0–53)
AST: 13 U/L (ref 0–37)
BILIRUBIN TOTAL: 0.9 mg/dL (ref 0.2–1.2)
BUN: 26 mg/dL — ABNORMAL HIGH (ref 6–23)
CALCIUM: 9.6 mg/dL (ref 8.4–10.5)
CHLORIDE: 101 meq/L (ref 96–112)
CO2: 29 mEq/L (ref 19–32)
CREATININE: 1.18 mg/dL (ref 0.40–1.50)
GFR: 69.64 mL/min (ref >60.00)
Glucose, Bld: 119 mg/dL — ABNORMAL HIGH (ref 70–99)
Potassium: 4.3 mEq/L (ref 3.5–5.1)
Sodium: 138 mEq/L (ref 135–145)
Total Protein: 6.7 g/dL (ref 6.0–8.3)

## 2016-12-03 LAB — HIGH SENSITIVITY CRP: CRP HIGH SENSITIVITY: 2.54 mg/L (ref 0.000–5.000)

## 2016-12-03 MED ORDER — PREDNISONE 1 MG PO TABS: 1.0000 mg | ORAL_TABLET | ORAL | 0 refills | Status: DC

## 2016-12-03 NOTE — Progress Notes (Signed)
Subjective:    Patient ID: JIM LUNDIN, male    DOB: 1965/10/26, 51 y.o.   MRN: [id number]  HPI Jaydin Boniface is a 51 yo male with PMH of Ileocolonic Crohn's disease and chronic eosinophilic pneumonia who is here for follow-up. He was last seen in the office on 09/04/2016 and then by the inpatient hospital team during his admission for Crohn's disease exacerbation until 09/14/2016.  He reports that recently he has been doing and feeling well. He has completed Entyvio reinduction and has had 3 infusions. His next infusion will come at the 8 week mark. He tolerated these well. He is taking 6-MP 100 mg daily and tolerating this. He reports resolution of his abdominal symptoms. No further diarrhea. He is having one formed bowel movement daily. He denies blood in his stool and melena. Denies abdominal pain. He reports improving appetite and denies pain with eating.  He has not needed Bentyl or Lomotil. He's off of narcotic pain medications.  He has been attempting to wean his prednisone down. He has been on chronic steroids for his eosinophilic pneumonitis. He reports he was weaning down from 20 mg daily and when he got down to 15 mg he develops sneezing, URI symptoms, runny nose and lethargy. He reports if he allows this to continue and does not increase his steroids he will develop fever followed by dyspnea and often hospitalization. He is currently taking prednisone 20 mg daily. He reports the lowest prednisone dose he has been able to ever achieved in the last several years is 12 mg daily.  His last colonoscopy was on 09/27/2014 by Dr. Tamsen Meek in [place]. This revealed a normal terminal ileum. 4 semi-pedunculated polyps in the rectum, sigmoid and splenic flexure and hepatic flexure. These were 15-25 mm in size and removed with hot snare. The mucosa basilar pattern was segmentally decreased in the entire colon and biopsies were taken. There were a few diverticuli in the sigmoid colon and internal  hemorrhoids. Pathology from the cecum and ascending colon showed colonic mucosa with no significant pathologic change. No active or chronic colitis. Transverse same. Descending mild changes with chronic quiescent colitis.  Sigmoid chronic active colitis. Rectum chronic active proctitis. All 4 polyps were inflammatory without dysplasia or adenomatous change.   Review of Systems As per HPI, otherwise negative  Current Medications, Allergies, Past Medical History, Past Surgical History, Family History and Social History were reviewed in [employer] record.     Objective:   Physical Exam BP 120/82   Pulse 74   Ht 5' 7"  (1.702 m)   Wt 169 lb 6 oz (76.8 kg)   BMI 26.53 kg/m  Constitutional: Well-developed and well-nourished. No distress. HEENT: Normocephalic and atraumatic. Oropharynx is clear and moist. Conjunctivae are normal.  No scleral icterus. Neck: Neck supple. Trachea midline. Cardiovascular: Normal rate, regular rhythm and intact distal pulses. No M/R/G Pulmonary/chest: Effort normal and breath sounds normal. No wheezing, rales or rhonchi. Abdominal: Soft, nontender, nondistended. Bowel sounds active throughout. There are no masses palpable. No hepatosplenomegaly. Extremities: no clubbing, cyanosis, or edema Neurological: Alert and oriented to person place and time. Skin: Skin is warm and dry. Psychiatric: Normal mood and affect. Behavior is normal.  CBC    Component Value Date/Time   WBC 15.0 (H) 09/13/2016 0331   RBC 4.67 09/13/2016 0331   HGB 13.5 09/13/2016 0331   HCT 42.2 09/13/2016 0331   PLT 304 09/13/2016 0331   MCV 90.4 09/13/2016 0331   MCH  28.9 09/13/2016 0331   MCHC 32.0 09/13/2016 0331   RDW 14.3 09/13/2016 0331   LYMPHSABS 0.7 09/10/2016 1333   MONOABS 0.5 09/10/2016 1333   EOSABS 0.1 09/10/2016 1333   BASOSABS 0.1 09/10/2016 1333   CMP     Component Value Date/Time   NA 139 09/14/2016 0358   K 4.2 09/14/2016 0358   CL 103  09/14/2016 0358   CO2 31 09/14/2016 0358   GLUCOSE 96 09/14/2016 0358   BUN 21 (H) 09/14/2016 0358   CREATININE 1.24 09/14/2016 0358   CALCIUM 8.3 (L) 09/14/2016 0358   PROT 6.1 (L) 09/11/2016 0355   ALBUMIN 3.3 (L) 09/11/2016 0355   AST 11 (L) 09/11/2016 0355   ALT 13 (L) 09/11/2016 0355   ALKPHOS 53 09/11/2016 0355   BILITOT 0.8 09/11/2016 0355   GFRNONAA >60 09/14/2016 0358   GFRAA >60 09/14/2016 0358   CT ABDOMEN AND PELVIS WITH CONTRAST (ENTEROGRAPHY)   TECHNIQUE: Multidetector CT of the abdomen and pelvis during bolus administration of intravenous contrast. Negative oral contrast was given.   CONTRAST:  134m ISOVUE-300 IOPAMIDOL (ISOVUE-300) INJECTION 61%   COMPARISON:  Overlapping portions of CT chest 02/11/2016   FINDINGS: Lower chest:  Unremarkable   Hepatobiliary: Multiple hypodense lesions are present in the liver, some of these are thought represent cysts were present on 02/11/2016. Dominant 1.6 cm lesion in the right hepatic lobe on image 49/6 has some nodular marginal enhancement on the early phase images and unfortunately is mostly excluded on the delayed images, but could represent a hemangioma. I am skeptical that this larger lesion is a cyst.   Pancreas: Unremarkable   Spleen: Unremarkable   Adrenals/Urinary Tract: 1.1 cm and separate 0.8 cm lesions the right kidney upper pole are likely cysts but technically nonspecific. Similar hypodense 6 mm right mid kidney lesion.   Adrenal glands normal.  No urinary tract calculi are identified.   Stomach/Bowel: The stomach is distended with the negative contrast agent. There collapsed loops of small bowel in addition to loops of small bowel containing the VoLumen oral contrast. There is some high density in the cecum resembling a small amount of conventional oral contrast.   There is only a mildly accentuated wall thickening and mucosal enhancement in the terminal most ileum, for example image  33/6. However, there is wall thickening and accentuated mucosal enhancement in the sigmoid colon extending along a approximately 9 cm region for example on image 29/6, likely representing Crohn's colitis and a skip lesion. There is also some involvement of the more proximal sigmoid colon. Admittedly this region is nondistended which can falsely exaggerate wall thickening. There does appear to be some formed stool in the descending colon.   No distended small bowel.   Vascular/Lymphatic: Aortoiliac atherosclerotic vascular disease.   Reproductive: Unremarkable   Other: No supplemental non-categorized findings.   Musculoskeletal: Dorsal column stimulator at the T12-L1 level.   Surgery absent or eroded left lamina at the S1 level through which extends a 3.3 by 2.4 by 2.1 cm primarily fluid density structure on image 54/7, with calcification along its inferior margin and possible continuity with the thecal sac.   IMPRESSION: 1. Suspected Crohn's colitis especially involving the sigmoid colon. There is only questionable mucosal enhancement and wall thickening of the terminal ileum. 2. Multiple hypodense liver lesions. Some of these are likely cysts and were present on 02/11/2016. One of the larger lesions in the right hepatic lobe measuring 1.6 cm in long axis probably has some internal  enhancement and could be a hemangioma but is technically nonspecific. If further liver lesion workup is warranted, hepatic protocol MRI with and without contrast would provide greater specificity. 3. Primarily fluid density lesion extending through a left S1 lamina defect, possibly a postoperative site of laminectomy with a pseudomeningocele. Prior port the patient has a history of schwannoma and recurrent schwannoma is not readily excluded although I am skeptical that this represents a schwannoma given the low-density. There is a dorsal column stimulator with leads at the thoracolumbar  junction. 4.  Aortic Atherosclerosis (ICD10-I70.0).     Electronically Signed   By: Van Clines M.D.   On: 09/10/2016 22:22       Assessment & Plan:  51 yo male with PMH of Ileocolonic Crohn's disease and chronic eosinophilic pneumonia who is here for follow-up.   1.  Ileocolonic Crohn's disease -- clinical remission after resuming Entyvio therapy. He is also on chronic steroids but more for #2. We will continue Entyvio infusions every 8 weeks. He will also continue 6-MP 100 mg daily. Check CBC, CMP and CRP today. Wean prednisone as much as possible, see #2 --Continue Entyvio therapy every 8 weeks --Continue 6-MP 100 mg daily --Labs today --Surveillance colonoscopy and for disease assessment recommended. We discussed the risk benefits and alternatives and he is agreeable and wishes to proceed. --Will need bone density testing and annual dermatology visits --I asked that he continue to avoid NSAIDs  2. Chronic eosinophilic pneumonitis -- steroid dependent. I've asked that he see Dr. Lake Bells to get his help in managing this disease and hopefully be able to achieve steroid free remission.  Six-month follow-up with me, sooner as needed, and for colonoscopy as scheduled 25 minutes spent with the patient today. Greater than 50% was spent in counseling and coordination of care with the patient

## 2016-12-03 NOTE — Patient Instructions (Addendum)
You have been scheduled to see Dr Lake Bells at [facility] (2nd floor of [facility]) on 02/11/17 at 4:15 pm.  It has been recommended to you by your physician that you have a(n) colonoscopy completed. Per your request, we did not schedule the procedure(s) today but will instead contact you when a schedule for a November morning appointment becomes available.   Continue mercaptopurine.  Continue Entyvio.  We have sent the following medications to your pharmacy for you to pick up at your convenience: Prednisone 1 mg  Your physician has requested that you go to the basement for the following lab work before leaving today: CBC, CMP, CRP  If you are age 51 or older, your body mass index should be between 23-30. Your Body mass index is 26.53 kg/m. If this is out of the aforementioned range listed, please consider follow up with your Primary Care Provider.  If you are age 70 or younger, your body mass index should be between 19-25. Your Body mass index is 26.53 kg/m. If this is out of the aformentioned range listed, please consider follow up with your Primary Care Provider.

## 2016-12-04 ENCOUNTER — Telehealth: Payer: Self-pay

## 2016-12-04 NOTE — Telephone Encounter (Signed)
Spoke to pharmacist and advised.

## 2016-12-04 NOTE — Telephone Encounter (Signed)
Patient received pneumovax 23 03/18/2016.  I don't repeat recommend pneumonia vaccination until 1 yr after previous vaccine so he's not due yet.  We can discuss at future OV.

## 2016-12-04 NOTE — Telephone Encounter (Signed)
[facility] [place] [facility]; [facility]; [facility] request cb with should pt get pneumonia shot now and if so which one. Per immunization record pt had pneumovax 03/18/16.Please advise.

## 2016-12-05 ENCOUNTER — Telehealth: Payer: Self-pay | Admitting: *Deleted

## 2016-12-05 NOTE — Telephone Encounter (Signed)
Left voicemail for patient to call back. I have some availabilities for November morning appointments for colonoscopies.

## 2016-12-08 NOTE — Telephone Encounter (Signed)
Patient has been scheduled for previsit and colonoscopy and verbalizes understanding of times and dates of both of these appointments.

## 2016-12-10 ENCOUNTER — Encounter: Payer: Self-pay | Admitting: Pulmonary Disease

## 2016-12-10 ENCOUNTER — Other Ambulatory Visit: Payer: BLUE CROSS/BLUE SHIELD

## 2016-12-10 ENCOUNTER — Ambulatory Visit (INDEPENDENT_AMBULATORY_CARE_PROVIDER_SITE_OTHER): Payer: BLUE CROSS/BLUE SHIELD | Admitting: Pulmonary Disease

## 2016-12-10 VITALS — BP 130/76 | HR 77 | Ht 67.0 in | Wt 168.0 lb

## 2016-12-10 DIAGNOSIS — K50919 Crohn's disease, unspecified, with unspecified complications: Secondary | ICD-10-CM | POA: Diagnosis not present

## 2016-12-10 DIAGNOSIS — D721 Eosinophilia, unspecified: Secondary | ICD-10-CM

## 2016-12-10 NOTE — Progress Notes (Signed)
Subjective:    Patient ID: Jack [name], male    DOB: [birth year], 51 y.o.   MRN: [id number]   Synopsis: Referred by Dr. [name] in 2018 for evaluation of hypereosinophilic syndrome with respiratory complaints with a background of Crohn's disease.  HPI Chief Complaint  Patient presents with  . Advice Only    Referred by Dr. [name] for chronic eosinophillic pna.  previously treated by Dr. Ashby Dawes.     This is a pleasant 51 year old male who comes to my clinic today for evaluation of abnormal respiratory complaints in the setting of hypereosinophilia. He tells me that he had a normal childhood without respiratory illnesses and was diagnosed with "colitis" around age 35 in 67. He says that the colitis did not give him any trouble for 10 years but approximately 10 years later he developed significant itching and some sinus trouble. He was found to have high serum eosinophils and so after a workup he was eventually sent to Dr. Otelia Limes with hematology at [facility]. A bone marrow biopsy showed evidence of overproduction of eosinophils. The patient tells me that Dr. Otelia Limes was concerned that he had the eosinophils because of his colitis. At this point he had a colonoscopy which showed significant enteric inflammation and biopsies were consistent with Crohn's disease.  Over the years he has been treated with biologic therapy for the Crohn's disease which seems to control his symptoms well. He's also been on prednisone for the majority of his at all life to keep respiratory complaints under control. Specifically, he has been taking as much as 20 mg of prednisone on a regular basis for the last 7-8 years. Whenever he drops the prednisone dose to about 15 mg to start to have sinus congestion, sneezing, cough, shortness of breath, mucus production. He is often been hospitalized for this. During tube is hospitalizations over in [place] (1 at [facility], the other at [place]) he had  bronchoscopies. It sounds as if he was diagnosed with eosinophilic pneumonia and treated with prednisone at that time.  Since 2014 he has been on chronic prednisone and whenever the doses taper he will have increasing sinus congestion and sneezing followed by cough and shortness of breath. He says that recently with one of these episodes even had some involvement of his kidneys. During this time he will never have any change in his GI symptoms.  While taking prednisone he says that his symptoms are completely controlled. He has used inhaled corticosteroids (currently using Symbicort) but he says he's never seen these help much. He does not have exertional dyspnea, cough or wheeze when he is feeling well.  Past Medical History:  Diagnosis Date  . BPH (benign prostatic hypertrophy)   . Colitis   . Colon polyp    inflammatory  . Crohn disease ([place]) 1992   history uveitis, involvement of intestines and lungs  . Diverticulosis   . Eosinophilic esophagitis   . Essential hypertension   . History of chicken pox   . History of gastroesophageal reflux (GERD)   . History of seizure 1995   grand mal x1, completed 6 yrs dilantin. no seizures since  . Internal hemorrhoids   . Osteoporosis 11/2015   DEXA T -2.9  . Schwannoma 2007   L axilla s/p surgery  . Ulnar neuropathy    h/o this from L arm schwannoma     Family History  Problem Relation Age of Onset  . CAD Mother  CABG  . Congenital heart disease Mother   . Hypertension Mother   . Hyperlipidemia Mother   . CAD Father        CABG  . Hypertension Father   . Hyperlipidemia Father   . Cancer Maternal Grandmother        colon  . Diabetes Neg Hx      Social History   Social History  . Marital status: Married    Spouse name: N/A  . Number of children: N/A  . Years of education: N/A   Occupational History  . Not on file.   Social History Main Topics  . Smoking status: Never Smoker  . Smokeless tobacco: Never Used  .  Alcohol use Yes     Comment: 1 beer/day  . Drug use: No  . Sexual activity: Not on file   Other Topics Concern  . Not on file   Social History Narrative   Lives with wife, 1 dog. Grown children   Edu: college   Occ: owns mattress store   Activity: active at work, started [employer], wants to restart running   Diet: good water, fruits/vegetables daily     Allergies  Allergen Reactions  . Cephalexin Nausea And Vomiting    Other Reaction: GI UPSET  . Duloxetine Other (See Comments)    Headaches   . Aspirin Hives and Rash     Outpatient Medications Prior to Visit  Medication Sig Dispense Refill  . amLODipine (NORVASC) 5 MG tablet TAKE 1 TABLET (5 MG TOTAL) BY MOUTH DAILY. 30 tablet 5  . Calcium Carbonate-Vit D-Min (CALCIUM 1200 PO) Take 1 capsule by mouth 2 (two) times daily.    [name] dicyclomine (BENTYL) 10 MG capsule Take 1 capsule (10 mg total) by mouth every 6 (six) hours as needed for spasms (cramping). 60 capsule 0  . diphenoxylate-atropine (LOMOTIL) 2.5-0.025 MG tablet Take 1-2 tablets by mouth four times daily as needed 240 tablet 2  . hydrOXYzine (ATARAX/VISTARIL) 25 MG tablet TAKE UP TO 4 TABLETS BY MOUTH ONCE DAILY AT BEDTIME AS NEEDED FOR ITCHING. 30 tablet 3  . mercaptopurine (PURINETHOL) 50 MG tablet Take 2 tablets (100 mg total) by mouth daily. Give on an empty stomach 1 hour before or 2 hours after meals. Caution: Chemotherapy. 60 tablet 3  . MERCAPTOPURINE PO Take 100 mg by mouth daily.    . methocarbamol (ROBAXIN) 500 MG tablet Take 1 tablet (500 mg total) by mouth 2 (two) times daily as needed for muscle spasms (sedation precautions). 30 tablet 0  . Multiple Vitamin (MULTIVITAMIN) capsule Take 1 capsule by mouth daily.    [name] oxyCODONE (OXY IR/ROXICODONE) 5 MG immediate release tablet Take 1 tablet (5 mg total) by mouth every 4 (four) hours as needed for moderate pain. 30 tablet 0  . predniSONE (DELTASONE) 1 MG tablet Take 1 tablet (1 mg total) by mouth as directed. 200  tablet 0  . predniSONE (DELTASONE) 20 MG tablet Take 20 mg by mouth daily with breakfast.    . tadalafil (CIALIS) 5 MG tablet Take 1 tablet (5 mg total) by mouth daily as needed for erectile dysfunction. 30 tablet 3  . tamsulosin (FLOMAX) 0.4 MG CAPS capsule TAKE 1 CAPSULE (0.4 MG TOTAL) BY MOUTH DAILY. 30 capsule 6  . budesonide-formoterol (SYMBICORT) 160-4.5 MCG/ACT inhaler Inhale 2 puffs into the lungs 2 (two) times daily. (Patient not taking: Reported on 12/10/2016) 1 Inhaler 3   No facility-administered medications prior to visit.  Review of Systems  Constitutional: Negative for fever and unexpected weight change.  HENT: Negative for congestion, dental problem, ear pain, nosebleeds, postnasal drip, rhinorrhea, sinus pressure, sneezing, sore throat and trouble swallowing.   Eyes: Negative for redness and itching.  Respiratory: Negative for cough, chest tightness, shortness of breath and wheezing.   Cardiovascular: Negative for palpitations and leg swelling.  Gastrointestinal: Negative for nausea and vomiting.  Genitourinary: Negative for dysuria.  Musculoskeletal: Negative for joint swelling.  Skin: Negative for rash.  Neurological: Negative for headaches.  Hematological: Does not bruise/bleed easily.  Psychiatric/Behavioral: Negative for dysphoric mood. The patient is not nervous/anxious.        Objective:   Physical Exam Vitals:   12/10/16 1328  BP: 130/76  Pulse: 77  SpO2: 97%  Weight: 168 lb (76.2 kg)  Height: 5' 7"  (1.702 m)    Gen: well appearing, no acute distress HENT: NCAT, OP clear, neck supple without masses Eyes: PERRL, EOMi Lymph: no cervical lymphadenopathy PULM: CTA B CV: RRR, no mgr, no JVD GI: BS+, soft, nontender, no hsm Derm: no rash or skin breakdown MSK: normal bulk and tone Neuro: A&Ox4, CN II-XII intact, strength 5/5 in all 4 extremities Psyche: normal mood and affect  Chest imaging: 2017 high-resolution CT scan of the chest images and  apparently reviewed by me showing normal pulmonary parenchyma without abnormality  Review of records from his past hospitalizations: In March 2014 he was hospitalized at [facility]. A CT scan of the chest showed bilateral masslike areas of consolidation. He had elevated eosinophils on blood count. He had a bronchoscopy which showed no evidence of eosinophils on bronchoalveolar lavage. During that time he had a serum ANCA test which was positive for P-ANCA.  He also had a negative stool ova and parasite exam. He had an extensive lab workup at that time. His serum ANA was slightly elevated at 1-40. At that time his serum IgE level was over 3000.      Assessment & Plan:  Eosinophilia - Plan: ANCA Screen Reflex Titer, IgE  Crohn's disease with complication, unspecified gastrointestinal tract location [facility])  Discussion: Jack [name] is here to see me today for evaluation of recurrent respiratory complaints in the setting of chronic eosinophilia. Objectively he has no complaints today or abnormal findings on physical exam. However, by my review of the records he's had significant evidence of eosinophilia in the past associated with an elevated serum IgE and an abnormal CT scan of the chest. Given the fact that he never had elevated eosinophils on bronchoscopy I cannot agree with the diagnosis of chronic eosinophilic pneumonia. In order to make that diagnosis you must have eosinophils on BAL or bronchoscopy. 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 It will be difficult to diagnosis with his chronic prednisone dependence.  What is unclear to me is if this form of vasculitis can be seen associated with Crohn's disease. I will need to discuss further with Dr. Elmo Putt.  Plan: Continue prednisone at current dosing Check serum ANCA test today, check  IgE  > 60 minutes spent on this office visit, 35 minutes face to face    Current Outpatient Prescriptions:  .  amLODipine (NORVASC) 5 MG tablet, TAKE 1 TABLET (5 MG TOTAL) BY MOUTH DAILY., Disp: 30 tablet, Rfl: 5 .  Calcium Carbonate-Vit D-Min (CALCIUM 1200 PO), Take 1  capsule by mouth 2 (two) times daily., Disp: , Rfl:  .  dicyclomine (BENTYL) 10 MG capsule, Take 1 capsule (10 mg total) by mouth every 6 (six) hours as needed for spasms (cramping)., Disp: 60 capsule, Rfl: 0 .  diphenoxylate-atropine (LOMOTIL) 2.5-0.025 MG tablet, Take 1-2 tablets by mouth four times daily as needed, Disp: 240 tablet, Rfl: 2 .  hydrOXYzine (ATARAX/VISTARIL) 25 MG tablet, TAKE UP TO 4 TABLETS BY MOUTH ONCE DAILY AT BEDTIME AS NEEDED FOR ITCHING., Disp: 30 tablet, Rfl: 3 .  mercaptopurine (PURINETHOL) 50 MG tablet, Take 2 tablets (100 mg total) by mouth daily. Give on an empty stomach 1 hour before or 2 hours after meals. Caution: Chemotherapy., Disp: 60 tablet, Rfl: 3 .  MERCAPTOPURINE PO, Take 100 mg by mouth daily., Disp: , Rfl:  .  methocarbamol (ROBAXIN) 500 MG tablet, Take 1 tablet (500 mg total) by mouth 2 (two) times daily as needed for muscle spasms (sedation precautions)., Disp: 30 tablet, Rfl: 0 .  Multiple Vitamin (MULTIVITAMIN) capsule, Take 1 capsule by mouth daily., Disp: , Rfl:  .  oxyCODONE (OXY IR/ROXICODONE) 5 MG immediate release tablet, Take 1 tablet (5 mg total) by mouth every 4 (four) hours as needed for moderate pain., Disp: 30 tablet, Rfl: 0 .  predniSONE (DELTASONE) 1 MG tablet, Take 1 tablet (1 mg total) by mouth as directed., Disp: 200 tablet, Rfl: 0 .  predniSONE (DELTASONE) 20 MG tablet, Take 20 mg by mouth daily with breakfast., Disp: , Rfl:  .  tadalafil (CIALIS) 5 MG tablet, Take 1 tablet (5 mg total) by mouth daily as needed for erectile dysfunction., Disp: 30 tablet, Rfl: 3 .  tamsulosin (FLOMAX) 0.4 MG CAPS capsule, TAKE 1 CAPSULE (0.4 MG TOTAL) BY MOUTH DAILY., Disp: 30 capsule,  Rfl: 6 .  budesonide-formoterol (SYMBICORT) 160-4.5 MCG/ACT inhaler, Inhale 2 puffs into the lungs 2 (two) times daily. (Patient not taking: Reported on 12/10/2016), Disp: 1 Inhaler, Rfl: 3

## 2016-12-10 NOTE — Patient Instructions (Signed)
For your chronic eosinophilia associated with respiratory symptoms and crohns: we will check a serum ANCA test today Keep taking prednisone as you are doing for now We will see you back in 3-4 weeks with me or an NP

## 2016-12-12 LAB — ANCA SCREEN W REFLEX TITER
ANCA Screen: POSITIVE — AB
C-ANCA Titer: 1:160 {titer} — ABNORMAL HIGH

## 2016-12-30 ENCOUNTER — Telehealth: Payer: Self-pay | Admitting: Pulmonary Disease

## 2016-12-30 NOTE — Telephone Encounter (Signed)
Rec'd from [facility] forwarded 38 pages to Simonne Maffucci B MD

## 2017-01-02 ENCOUNTER — Other Ambulatory Visit: Payer: Self-pay | Admitting: Internal Medicine

## 2017-01-08 ENCOUNTER — Ambulatory Visit (INDEPENDENT_AMBULATORY_CARE_PROVIDER_SITE_OTHER): Payer: BLUE CROSS/BLUE SHIELD | Admitting: Acute Care

## 2017-01-08 ENCOUNTER — Encounter: Payer: Self-pay | Admitting: Acute Care

## 2017-01-08 ENCOUNTER — Telehealth: Payer: Self-pay | Admitting: Acute Care

## 2017-01-08 VITALS — BP 138/78 | HR 70 | Ht 67.0 in | Wt 167.5 lb

## 2017-01-08 DIAGNOSIS — D721 Eosinophilia: Secondary | ICD-10-CM | POA: Diagnosis not present

## 2017-01-08 DIAGNOSIS — D7218 Eosinophilia in diseases classified elsewhere: Secondary | ICD-10-CM | POA: Insufficient documentation

## 2017-01-08 DIAGNOSIS — I776 Arteritis, unspecified: Secondary | ICD-10-CM

## 2017-01-08 DIAGNOSIS — M79661 Pain in right lower leg: Secondary | ICD-10-CM

## 2017-01-08 DIAGNOSIS — D72119 Hypereosinophilic syndrome (hes), unspecified: Secondary | ICD-10-CM

## 2017-01-08 DIAGNOSIS — M79662 Pain in left lower leg: Secondary | ICD-10-CM | POA: Diagnosis not present

## 2017-01-08 DIAGNOSIS — M301 Polyarteritis with lung involvement [Churg-Strauss]: Secondary | ICD-10-CM | POA: Insufficient documentation

## 2017-01-08 NOTE — Telephone Encounter (Signed)
Referral placed.

## 2017-01-08 NOTE — Progress Notes (Addendum)
History of Present Illness Jack [name] is a 51 y.o. male never smoker with hypereosinophilic syndrome with Crohn's Disease,  respiratory complaints and concern for Churg-Strauss syndrome and vasculitis (positive p-ANCA in 2014, Positive ANCA 11/2016)  .He is  followed by Dr. Lake Bells.  Synopsis: Referred by Dr. [name] in 2018 for evaluation of hypereosinophilic syndrome with respiratory complaints with a background of Crohn's disease. 50 year old male seen by Dr. Lake Bells  for evaluation of abnormal respiratory complaints in the setting of hypereosinophilia.  He  had a normal childhood without respiratory illnesses and was diagnosed with "colitis" around age 4 in 48. He says that the colitis did not give him any trouble for 10 years but approximately 10 years later he developed significant itching and some sinus trouble. He was found to have high serum eosinophils and so after a workup he was eventually sent to Dr. Otelia Limes with hematology at [facility]. A bone marrow biopsy showed evidence of overproduction of eosinophils. The patient tells me that Dr. Otelia Limes was concerned that he had the eosinophils because of his colitis. At this point he had a colonoscopy which showed significant enteric inflammation and biopsies were consistent with Crohn's disease.  Over the years he has been treated with biologic therapy for the Crohn's disease which seems to control his symptoms well. He's also been on prednisone for the majority of his at all life to keep respiratory complaints under control. Specifically, he has been taking as much as 20 mg of prednisone on a regular basis for the last 7-8 years. Whenever he drops the prednisone dose to about 15 mg to start to have sinus congestion, sneezing, cough, shortness of breath, mucus production. He is often been hospitalized for this. During tube is hospitalizations over in [place] (1 at [facility], the other at [place]) he had bronchoscopies. It  sounds as if he was diagnosed with eosinophilic pneumonia and treated with prednisone at that time.  Since 2014 he has been on chronic prednisone and whenever the doses taper he will have increasing sinus congestion and sneezing followed by cough and shortness of breath. He says that recently with one of these episodes even had some involvement of his kidneys. During this time he will never have any change in his GI symptoms.  While taking prednisone he says that his symptoms are completely controlled. He has used inhaled corticosteroids (currently using Symbicort) but he says he's never seen these help much. He does not have exertional dyspnea, cough or wheeze when he is feeling well.   01/08/2017 Follow Up: Pt. Presents for follow up. He was seen by Dr. Lake Bells 12/10/2016. He had concerns that the patient has Churg-Strauss syndrome.ANCA was drawn which was positive. IgE was not drawn? He states he is doing well. He is maintained on 18 mg of prednisone daily. He has been on prednisone 7-8 years.He states he has been tired lately which is usually a sign he needs to increase his prednisone. He states he may increase his prednisone to 20 mg daily. He denies a cough or sputum production. He denies any fever of chest pain, orthopnea or hemoptysis.[name] He is not having a flare of his Chrohn's.  He is complaining of some right calf pain that he has had since Sunday pm ( 01/04/2017). He denies any prolonged decrease in mobility or travel ( either automobile or airline).  Test Results: 2017 high-resolution CT scan of the chest images and apparently reviewed by me showing normal pulmonary parenchyma without  abnormality  ANCA: 1:160  Positive : confirms  suspicion that he has vasculitis, or inflammation of his blood vessels. Dr. Lake Bells  to discuss with Dr. [name] re: rheumatology referral.  01/2016: No evidence of interstitial lung disease. No findings to explain the patient's symptoms. 2. Aortic  atherosclerosis (ICD10-170.0). Coronary artery calcifications. 3. Mild uniform compression of T7, likely chronic.  CBC Latest Ref Rng & Units 12/03/2016 09/13/2016 09/12/2016  WBC 4.0 - 10.5 K/uL 13.6(H) 15.0(H) 12.6(H)  Hemoglobin 13.0 - 17.0 g/dL 14.1 13.5 12.8(L)  Hematocrit 39.0 - 52.0 % 44.0 42.2 39.7  Platelets 150.0 - 400.0 K/uL 287.0 304 308    BMP Latest Ref Rng & Units 12/03/2016 09/14/2016 09/13/2016  Glucose 70 - 99 mg/dL 119(H) 96 100(H)  BUN 6 - 23 mg/dL 26(H) 21(H) 21(H)  Creatinine 0.40 - 1.50 mg/dL 1.18 1.24 1.43(H)  Sodium 135 - 145 mEq/L 138 139 140  Potassium 3.5 - 5.1 mEq/L 4.3 4.2 4.6  Chloride 96 - 112 mEq/L 101 103 103  CO2 19 - 32 mEq/L 29 31 31   Calcium 8.4 - 10.5 mg/dL 9.6 8.3(L) 8.6(L)      Past medical hx Past Medical History:  Diagnosis Date  . BPH (benign prostatic hypertrophy)   . Colitis   . Colon polyp    inflammatory  . Crohn disease ([place]) 1992   history uveitis, involvement of intestines and lungs  . Diverticulosis   . Eosinophilic esophagitis   . Essential hypertension   . History of chicken pox   . History of gastroesophageal reflux (GERD)   . History of seizure 1995   grand mal x1, completed 6 yrs dilantin. no seizures since  . Internal hemorrhoids   . Osteoporosis 11/2015   DEXA T -2.9  . Schwannoma 2007   L axilla s/p surgery  . Ulnar neuropathy    h/o this from L arm schwannoma     Social History  Substance Use Topics  . Smoking status: Never Smoker  . Smokeless tobacco: Never Used  . Alcohol use Yes     Comment: 1 beer/day    Mr.Christine reports that he has never smoked. He has never used smokeless tobacco. He reports that he drinks alcohol. He reports that he does not use drugs.  Tobacco Cessation: Never smoker  Past surgical hx, Family hx, Social hx all reviewed.  Current Outpatient Prescriptions on File Prior to Visit  Medication Sig  . amLODipine (NORVASC) 5 MG tablet TAKE 1 TABLET (5 MG TOTAL) BY MOUTH DAILY.    . budesonide-formoterol (SYMBICORT) 160-4.5 MCG/ACT inhaler Inhale 2 puffs into the lungs 2 (two) times daily.  . Calcium Carbonate-Vit D-Min (CALCIUM 1200 PO) Take 1 capsule by mouth 2 (two) times daily.  [name] dicyclomine (BENTYL) 10 MG capsule Take 1 capsule (10 mg total) by mouth every 6 (six) hours as needed for spasms (cramping).  . diphenoxylate-atropine (LOMOTIL) 2.5-0.025 MG tablet Take 1-2 tablets by mouth four times daily as needed  . hydrOXYzine (ATARAX/VISTARIL) 25 MG tablet TAKE UP TO 4 TABLETS BY MOUTH ONCE DAILY AT BEDTIME AS NEEDED FOR ITCHING.  . mercaptopurine (PURINETHOL) 50 MG tablet Take 2 tablets (100 mg total) by mouth daily. Give on an empty stomach 1 hour before or 2 hours after meals. Caution: Chemotherapy.  . MERCAPTOPURINE PO Take 100 mg by mouth daily.  . methocarbamol (ROBAXIN) 500 MG tablet Take 1 tablet (500 mg total) by mouth 2 (two) times daily as needed for muscle spasms (sedation precautions).  . Multiple  Vitamin (MULTIVITAMIN) capsule Take 1 capsule by mouth daily.  [name] oxyCODONE (OXY IR/ROXICODONE) 5 MG immediate release tablet Take 1 tablet (5 mg total) by mouth every 4 (four) hours as needed for moderate pain.  . predniSONE (DELTASONE) 1 MG tablet TAKE 1 TABLET (1 MG TOTAL) BY MOUTH AS DIRECTED. 7 TABLETS DAILY MAX  . predniSONE (DELTASONE) 20 MG tablet Take 20 mg by mouth daily with breakfast.  . tadalafil (CIALIS) 5 MG tablet Take 1 tablet (5 mg total) by mouth daily as needed for erectile dysfunction.  . tamsulosin (FLOMAX) 0.4 MG CAPS capsule TAKE 1 CAPSULE (0.4 MG TOTAL) BY MOUTH DAILY.   No current facility-administered medications on file prior to visit.      Allergies  Allergen Reactions  . Cephalexin Nausea And Vomiting    Other Reaction: GI UPSET  . Duloxetine Other (See Comments)    Headaches   . Aspirin Hives and Rash    Review Of Systems:  Constitutional:   No  weight loss, night sweats,  Fevers, chills, + fatigue, or   lassitude.  HEENT:   No headaches,  Difficulty swallowing,  Tooth/dental problems, or  Sore throat,                No sneezing, itching, ear ache, nasal congestion, post nasal drip,   CV:  No chest pain,  Orthopnea, PND, swelling in lower extremities, anasarca, dizziness, palpitations, syncope.   GI  No heartburn, indigestion, abdominal pain, nausea, vomiting, diarrhea, change in bowel habits, loss of appetite, bloody stools.   Resp: No shortness of breath with exertion or at rest.  No excess mucus, no productive cough,  No non-productive cough,  No coughing up of blood.  No change in color of mucus.  No wheezing.  No chest wall deformity  Skin: no rash or lesions.  GU: no dysuria, change in color of urine, no urgency or frequency.  No flank pain, no hematuria   MS:  No joint pain or swelling.  No decreased range of motion.  No back pain.  Psych:  No change in mood or affect. No depression or anxiety.  No memory loss.   Vital Signs BP 138/78 (BP Location: Left Arm, Cuff Size: Normal)   Pulse 70   Ht 5' 7"  (1.702 m)   Wt 167 lb 8 oz (76 kg)   SpO2 98%   BMI 26.23 kg/m    Physical Exam:  General- No distress,  A&Ox3, pleasant ENT: No sinus tenderness, TM clear, pale nasal mucosa, no oral exudate,no post nasal drip, no LAN Cardiac: S1, S2, regular rate and rhythm, no murmur Chest: No wheeze/ rales/ dullness; no accessory muscle use, no nasal flaring, no sternal retractions Abd.: Soft Non-tender, non distended, BS + Ext: No clubbing cyanosis, edema Neuro:  normal strength Skin: No rashes, warm and dry Psych: normal mood and behavior   Assessment/Plan  Hypereosinophilic syndrome Stable at present on 18 mg prednisone daily New complaint of right calf pain since 01/04/17 Plan: Continue your prednisone at 18 mg daily as you have been doing. Lower extremity venous doppler studies today or tomorrow. Follow up with Dr. Fayne Mediate in 3 months. Please call if you start to have any  respiratory signs and symptoms. So you can be seen earlier. Please contact office for sooner follow up if symptoms do not improve or worsen or seek emergency care   Consider bronch with BAL at next respiratory flare to R/O chronic eosinophilic pneumonia vs. Churg-Strauss syndrome.  Vasculitis ([place]) Will refer to [facility] rheumatology  I discussed this case with Dr. Lake Bells. He is highly suspicious that this patient has vasculitis versus chronic eosinophilic pneumonia. He feels the patient would best be served with referral to [facility] rheumatology for evaluation of vasculitis and possible treatment that would allow liberation from prednisone. I have called the patient, left a message explaining the above, with our contact information. I placed referral for evaluation by [facility] rheumatology.  Magdalen Spatz, NP 01/08/2017  2:12 PM

## 2017-01-08 NOTE — Telephone Encounter (Signed)
I called patient to let him know we will refer him to [facility] Rheumatology. There was no answer. I left a message with contact information. Jonelle Sidle, please place referral to [facility] Rheumatology Dept. ( Vasculitis)Thanks so much.

## 2017-01-08 NOTE — Assessment & Plan Note (Addendum)
Stable at present on 18 mg prednisone daily New complaint of right calf pain since 01/04/17 Plan: Continue your prednisone at 18 mg daily as you have been doing. Lower extremity venous doppler studies today or tomorrow. Follow up with Jack [name] in 3 months. Please call if you start to have any respiratory signs and symptoms. So you can be seen earlier. Please contact office for sooner follow up if symptoms do not improve or worsen or seek emergency care   Consider bronch with BAL at next respiratory flare to R/O chronic eosinophilic pneumonia vs. Churg-Strauss syndrome.

## 2017-01-08 NOTE — Assessment & Plan Note (Signed)
Will refer to [facility] rheumatology

## 2017-01-08 NOTE — Patient Instructions (Addendum)
It is nice to meet you today. Continue your prednisone at 18 mg daily as you have been doing. Lower extremity venous doppler studies today or tomorrow. Follow up with Dr. Fayne Mediate in 3 months. Please call if you start to have any respiratory signs and symptoms. So you can be seen earlier. Please contact office for sooner follow up if symptoms do not improve or worsen or seek emergency care

## 2017-01-09 NOTE — Progress Notes (Signed)
Reviewed, discussed, agree 

## 2017-01-12 ENCOUNTER — Other Ambulatory Visit: Payer: Self-pay | Admitting: Internal Medicine

## 2017-01-16 ENCOUNTER — Telehealth: Payer: Self-pay

## 2017-01-16 ENCOUNTER — Ambulatory Visit (HOSPITAL_COMMUNITY)
Admission: RE | Admit: 2017-01-16 | Discharge: 2017-01-16 | Disposition: A | Discharge status: Home / Self Care | Payer: BLUE CROSS/BLUE SHIELD | Source: Ambulatory Visit | Attending: Cardiovascular Disease | Admitting: Cardiovascular Disease

## 2017-01-16 DIAGNOSIS — M79662 Pain in left lower leg: Secondary | ICD-10-CM | POA: Insufficient documentation

## 2017-01-16 DIAGNOSIS — M79661 Pain in right lower leg: Secondary | ICD-10-CM | POA: Diagnosis present

## 2017-01-16 NOTE — Telephone Encounter (Signed)
Jack Spatz, NP  Jannette Spanner, CMA        Please call patient and let him know his doppler study was negative for DVT. Thanks   Previous Messages      ATC pt, no answer. Left message for pt to call back.

## 2017-01-19 NOTE — Telephone Encounter (Signed)
Copied from [place] #[id number]. Topic: Inquiry >> Jan 19, 2017  4:37 PM Boyd Kerbs wrote: Reason for CRM: [facility] and needs to know if needs to come in or can you call in refill

## 2017-01-19 NOTE — Telephone Encounter (Signed)
CRM added to Telephone Encounter Please call pt regarding doppler and RX

## 2017-01-20 ENCOUNTER — Telehealth: Payer: Self-pay

## 2017-01-20 MED ORDER — TADALAFIL 5 MG PO TABS: 5.0000 mg | ORAL_TABLET | Freq: Every day | ORAL | 2 refills | Status: DC | PRN

## 2017-01-20 MED ORDER — TAMSULOSIN HCL 0.4 MG PO CAPS: 0.4000 mg | ORAL_CAPSULE | Freq: Every day | ORAL | 2 refills | Status: DC

## 2017-01-20 MED ORDER — HYDROXYZINE HCL 25 MG PO TABS: ORAL_TABLET | ORAL | 2 refills | Status: DC

## 2017-01-20 MED ORDER — AMLODIPINE BESYLATE 5 MG PO TABS: 5.0000 mg | ORAL_TABLET | Freq: Every day | ORAL | 2 refills | Status: DC

## 2017-01-20 NOTE — Telephone Encounter (Signed)
ATC pt, no answer. Left message for pt to call back.  

## 2017-01-20 NOTE — Telephone Encounter (Signed)
Pt. Requests medications be sent to [facility].

## 2017-01-20 NOTE — Progress Notes (Signed)
This encounter was created in error - please disregard.

## 2017-01-30 ENCOUNTER — Encounter: Payer: Self-pay | Admitting: Internal Medicine

## 2017-02-11 ENCOUNTER — Other Ambulatory Visit: Payer: Self-pay | Admitting: Internal Medicine

## 2017-02-11 ENCOUNTER — Institutional Professional Consult (permissible substitution): Payer: BLUE CROSS/BLUE SHIELD | Admitting: Pulmonary Disease

## 2017-02-18 ENCOUNTER — Encounter: Payer: BLUE CROSS/BLUE SHIELD | Admitting: Internal Medicine

## 2017-02-24 ENCOUNTER — Encounter: Payer: BLUE CROSS/BLUE SHIELD | Admitting: Internal Medicine

## 2017-03-02 ENCOUNTER — Telehealth: Payer: Self-pay

## 2017-03-02 NOTE — Telephone Encounter (Signed)
Patient Name: Jack [name] Gender: Male DOB: [birth year] Age: 51 Y 45 M 29 D Return Phone Number: [phone] (Primary), [phone] (Secondary) [place] [address] Client [place] [facility] - Client Client Site [place] Physician [name] - MD Contact Type Call Who Is Calling Patient / Member / Family / Caregiver Call Type Triage / Clinical Caller Name Salvatore Shear Relationship To Patient Spouse Return Phone Number [phone] (Primary) Chief Complaint Unknown Complaint Reason for Call Symptomatic / Request for Health Information Initial Comment Caller's husband's potassium is 5.5. Translation No Nurse Assessment Nurse: Elissa Hefty, RN, Anderson Malta Date/Time Eilene Ghazi Time): 02/27/2017 7:45:48 PM Confirm and document reason for call. If symptomatic, describe symptoms. ---Caller states her husbands potassium level today is high. Saw a Rheumatologist and they drew blood and his Potassium is 5.5, unknown if the specimen was hemolized. Pt states they got him in one stick and filled the blood tubes normally. And they Rheumatoid office stated to call the Primary care Dr for follow up. Looking for EGPA and testing him for this. He is not on potassium, but has eaten sweet potato casserole like crazy, but it is now gone. No multi vitamin, pt feels fine, urinating normally, no blood no heart issues. on Flomax for BPH Does the patient have any new or worsening symptoms? ---No Please document clinical information provided and list any resource used. ---Will call the on call Dr to be advised as to what do tonight. Guidelines Guideline Title Affirmed Question Affirmed Notes Nurse Date/Time (Eastern Time) Disp. Time Eilene Ghazi Time) Disposition Final User 02/27/2017 7:56:18 PM Paged On Call back to [facility], [place] 02/27/2017 8:09:08 PM Clinical Call Yes Elissa Hefty, RN, Nell Range  Disagree/Comply Comply Caller Understands No Comments User: Biagio Quint, RN Date/Time Eilene Ghazi Time): 02/27/2017 7:57:14 PM PLEASE NOTE: All timestamps contained within this report are represented as [place] Standard Time. CONFIDENTIALTY NOTICE: This fax transmission is intended only for the addressee. It contains information that is legally privileged, confidential or otherwise protected from use or disclosure. If you are not the intended recipient, you are strictly prohibited from reviewing, disclosing, copying using or disseminating any of this information or taking any action in reliance on or regarding this information. If you have received this fax in error, please notify us immediately by telephone so that we can arrange for its return to [place]. Phone: [phone], Toll-Free: [phone], Fax: [phone] Page: 2 of 2 Call Id: [id number] Comments Pt asymptomatic, unknown if sample hemolized. Calling on call to advise. User: Biagio Quint, RN Date/Time Eilene Ghazi Time): 02/27/2017 8:08:14 PM Called the wife and gave the update from the on call Dr, that, in the mean time, she recommends since the patient is fine showing no symptoms, to monitor at home, if at any time he feel different or shows s/s to immediately go to the nearest [facility] or ED to be rechecked and evaluated. Called the Wife back and she verbalized understanding of s/s and that if he seem like he's not feeling well to go to the [facility] immediately. Paging DoctorName Phone DateTime Result/Outcome Message Type Notes Colin Benton- MD [phone] 02/27/2017 4:19:62 PM Paged On Call Back to Call Center Doctor Paged [facility] RN Biagio Quint RN [phone] Thank You please call. Colin Benton- MD 02/27/2017 8:04:16 PM Spoke with On Call - General Message Result Dr Maudie Mercury called back unsure why they wanted him to call the PCP, since the [facility] Dr ordered teh blood, he should folow  up with the [facility] Dr, BUT in the mean  time, she recommends since the patient is fine showing no symptoms, to monitor at home, if at any time he feel different or shows s/s to immediatly go to the nearest [facility] or ED to be rechecked and evaluated

## 2017-03-05 ENCOUNTER — Telehealth: Payer: Self-pay

## 2017-03-05 NOTE — Telephone Encounter (Signed)
Patient no showed for Pre-Visit. Left message for patient to call before 5:00 Pm to reschedule PV. If we don't hear from him by 5:00 Pm we will cancel his scheduled colonoscopy per [place] guidelines. A no show letter will be sent at the end of the business day.   Riki Sheer, LPN ( PV )

## 2017-03-06 ENCOUNTER — Telehealth: Payer: Self-pay

## 2017-03-06 NOTE — Telephone Encounter (Signed)
Pt was scheduled for pre visit on 12/10 and we had to reschedule due to snow. His colon was booked for 12/17. I called to r/s and he requested the procedure be moved to Christmas Eve. Dr. [name] did not have an appt for that day. Pt stated that he would have to wait until Feb to have the procedure. I put in a recall for pt to be notified January 1st to have a previsit and to be scheduled in Feb for colon.

## 2017-03-16 ENCOUNTER — Encounter: Payer: BLUE CROSS/BLUE SHIELD | Admitting: Internal Medicine

## 2017-03-20 ENCOUNTER — Ambulatory Visit (INDEPENDENT_AMBULATORY_CARE_PROVIDER_SITE_OTHER): Payer: BLUE CROSS/BLUE SHIELD | Admitting: Family Medicine

## 2017-03-20 ENCOUNTER — Encounter: Payer: Self-pay | Admitting: Family Medicine

## 2017-03-20 VITALS — BP 124/70 | HR 79 | Temp 98.7°F | Ht 66.5 in | Wt 165.0 lb

## 2017-03-20 DIAGNOSIS — Z0000 Encounter for general adult medical examination without abnormal findings: Secondary | ICD-10-CM | POA: Diagnosis not present

## 2017-03-20 DIAGNOSIS — N401 Enlarged prostate with lower urinary tract symptoms: Secondary | ICD-10-CM | POA: Diagnosis not present

## 2017-03-20 DIAGNOSIS — I776 Arteritis, unspecified: Secondary | ICD-10-CM | POA: Diagnosis not present

## 2017-03-20 DIAGNOSIS — R739 Hyperglycemia, unspecified: Secondary | ICD-10-CM | POA: Diagnosis not present

## 2017-03-20 DIAGNOSIS — R350 Frequency of micturition: Secondary | ICD-10-CM

## 2017-03-20 DIAGNOSIS — I10 Essential (primary) hypertension: Secondary | ICD-10-CM | POA: Diagnosis not present

## 2017-03-20 DIAGNOSIS — M818 Other osteoporosis without current pathological fracture: Secondary | ICD-10-CM | POA: Diagnosis not present

## 2017-03-20 DIAGNOSIS — D72119 Hypereosinophilic syndrome (hes), unspecified: Secondary | ICD-10-CM

## 2017-03-20 DIAGNOSIS — K50811 Crohn's disease of both small and large intestine with rectal bleeding: Secondary | ICD-10-CM | POA: Diagnosis not present

## 2017-03-20 DIAGNOSIS — D721 Eosinophilia: Secondary | ICD-10-CM

## 2017-03-20 LAB — PSA: PSA: 7.93 ng/mL — AB (ref 0.10–4.00)

## 2017-03-20 LAB — BASIC METABOLIC PANEL
BUN: 24 mg/dL — ABNORMAL HIGH (ref 6–23)
CALCIUM: 8.7 mg/dL (ref 8.4–10.5)
CO2: 31 meq/L (ref 19–32)
Chloride: 102 mEq/L (ref 96–112)
Creatinine, Ser: 1.38 mg/dL (ref 0.40–1.50)
GFR: 58.06 mL/min — AB (ref >60.00)
Glucose, Bld: 98 mg/dL (ref 70–99)
Potassium: 4.5 mEq/L (ref 3.5–5.1)
SODIUM: 138 meq/L (ref 135–145)

## 2017-03-20 LAB — LIPID PANEL
CHOL/HDL RATIO: 4
CHOLESTEROL: 228 mg/dL — AB (ref 0–200)
HDL: 51.5 mg/dL (ref >39.00)
LDL CALC: 146 mg/dL — AB (ref 0–99)
NonHDL: 176.72
Triglycerides: 152 mg/dL — ABNORMAL HIGH (ref 0.0–149.0)
VLDL: 30.4 mg/dL (ref 0.0–40.0)

## 2017-03-20 LAB — HEMOGLOBIN A1C: HEMOGLOBIN A1C: 5.4 % (ref 4.6–6.5)

## 2017-03-20 LAB — VITAMIN D 25 HYDROXY (VIT D DEFICIENCY, FRACTURES): VITD: 40.82 ng/mL (ref 30.00–100.00)

## 2017-03-20 MED ORDER — HYDROXYZINE HCL 25 MG PO TABS: ORAL_TABLET | ORAL | 2 refills | Status: DC

## 2017-03-20 NOTE — Assessment & Plan Note (Addendum)
?  churg-strauss, Appreciate [facility] rheum care. Will await eval.

## 2017-03-20 NOTE — Assessment & Plan Note (Signed)
Established with Dr [name]. Appreciate GI care. Stable period on prednisone 60m daily. Recently started on imuran

## 2017-03-20 NOTE — Patient Instructions (Addendum)
Bring bone density scan with you to next rheumatology appointment.  Labs today - including prostate and sugar check. You are doing well today.  Return as needed or in 1 year for next physical.  Health Maintenance, Male A healthy lifestyle and preventive care is important for your health and wellness. Ask your health care provider about what schedule of regular examinations is right for you. What should I know about weight and diet? Eat a Healthy Diet  Eat plenty of vegetables, fruits, whole grains, low-fat dairy products, and lean protein.  Do not eat a lot of foods high in solid fats, added sugars, or salt.  Maintain a Healthy Weight Regular exercise can help you achieve or maintain a healthy weight. You should:  Do at least 150 minutes of exercise each week. The exercise should increase your heart rate and make you sweat (moderate-intensity exercise).  Do strength-training exercises at least twice a week.  Watch Your Levels of Cholesterol and Blood Lipids  Have your blood tested for lipids and cholesterol every 5 years starting at 49 years of age. If you are at high risk for heart disease, you should start having your blood tested when you are 49 years old. You may need to have your cholesterol levels checked more often if: ? Your lipid or cholesterol levels are high. ? You are older than 49 years of age. ? You are at high risk for heart disease.  What should I know about cancer screening? Many types of cancers can be detected early and may often be prevented. Lung Cancer  You should be screened every year for lung cancer if: ? You are a current smoker who has smoked for at least 30 years. ? You are a former smoker who has quit within the past 15 years.  Talk to your health care provider about your screening options, when you should start screening, and how often you should be screened.  Colorectal Cancer  Routine colorectal cancer screening usually begins at 49 years of age  and should be repeated every 5-10 years until you are 49 years old. You may need to be screened more often if early forms of precancerous polyps or small growths are found. Your health care provider may recommend screening at an earlier age if you have risk factors for colon cancer.  Your health care provider may recommend using home test kits to check for hidden blood in the stool.  A small camera at the end of a tube can be used to examine your colon (sigmoidoscopy or colonoscopy). This checks for the earliest forms of colorectal cancer.  Prostate and Testicular Cancer  Depending on your age and overall health, your health care provider may do certain tests to screen for prostate and testicular cancer.  Talk to your health care provider about any symptoms or concerns you have about testicular or prostate cancer.  Skin Cancer  Check your skin from head to toe regularly.  Tell your health care provider about any new moles or changes in moles, especially if: ? There is a change in a mole's size, shape, or color. ? You have a mole that is larger than a pencil eraser.  Always use sunscreen. Apply sunscreen liberally and repeat throughout the day.  Protect yourself by wearing long sleeves, pants, a wide-brimmed hat, and sunglasses when outside.  What should I know about heart disease, diabetes, and high blood pressure?  If you are 42-64 years of age, have your blood pressure checked every 3-5  years. If you are 92 years of age or older, have your blood pressure checked every year. You should have your blood pressure measured twice-once when you are at a hospital or clinic, and once when you are not at a hospital or clinic. Record the average of the two measurements. To check your blood pressure when you are not at a hospital or clinic, you can use: ? An automated blood pressure machine at a pharmacy. ? A home blood pressure monitor.  Talk to your health care provider about your target blood  pressure.  If you are between 67-68 years old, ask your health care provider if you should take aspirin to prevent heart disease.  Have regular diabetes screenings by checking your fasting blood sugar level. ? If you are at a normal weight and have a low risk for diabetes, have this test once every three years after the age of 66. ? If you are overweight and have a high risk for diabetes, consider being tested at a younger age or more often.  A one-time screening for abdominal aortic aneurysm (AAA) by ultrasound is recommended for men aged 69-75 years who are current or former smokers. What should I know about preventing infection? Hepatitis B If you have a higher risk for hepatitis B, you should be screened for this virus. Talk with your health care provider to find out if you are at risk for hepatitis B infection. Hepatitis C Blood testing is recommended for:  Everyone born from [birth year] through 1963.  Anyone with known risk factors for hepatitis C.  Sexually Transmitted Diseases (STDs)  You should be screened each year for STDs including gonorrhea and chlamydia if: ? You are sexually active and are younger than 49 years of age. ? You are older than 49 years of age and your health care provider tells you that you are at risk for this type of infection. ? Your sexual activity has changed since you were last screened and you are at an increased risk for chlamydia or gonorrhea. Ask your health care provider if you are at risk.  Talk with your health care provider about whether you are at high risk of being infected with HIV. Your health care provider may recommend a prescription medicine to help prevent HIV infection.  What else can I do?  Schedule regular health, dental, and eye exams.  Stay current with your vaccines (immunizations).  Do not use any tobacco products, such as cigarettes, chewing tobacco, and e-cigarettes. If you need help quitting, ask your health care  provider.  Limit alcohol intake to no more than 2 drinks per day. One drink equals 12 ounces of beer, 5 ounces of wine, or 1 ounces of hard liquor.  Do not use street drugs.  Do not share needles.  Ask your health care provider for help if you need support or information about quitting drugs.  Tell your health care provider if you often feel depressed.  Tell your health care provider if you have ever been abused or do not feel safe at home. This information is not intended to replace advice given to you by your health care provider. Make sure you discuss any questions you have with your health care provider. Document Released: 09/13/2007 Document Revised: 11/14/2015 Document Reviewed: 12/19/2014 Elsevier Interactive Patient Education  [employer].

## 2017-03-20 NOTE — Assessment & Plan Note (Signed)
DRE/PSA today. Continue flomax.

## 2017-03-20 NOTE — Assessment & Plan Note (Signed)
Chronic, stable. Continue current regimen. 

## 2017-03-20 NOTE — Progress Notes (Signed)
BP 124/70 (BP Location: Left Arm, Patient Position: Sitting, Cuff Size: Normal)   Pulse 79   Temp 98.7 F (37.1 C) (Oral)   Ht 5' 6.5" (1.689 m)   Wt 165 lb (74.8 kg)   SpO2 96%   BMI 26.23 kg/m    CC: CPE Subjective:    Patient ID: Jack [name], male    DOB: December 09, 1965, 51 y.o.   MRN: [id number]  HPI: Jack [name] is a 51 y.o. male presenting on 03/20/2017 for Annual Exam   Eosinophilia with crohn's disease ?churg-strauss syndrome. Has been referred to [place] rheumatology - note reviewed. Planned f/u 06/2017.   Preventative: COLONOSCOPY during hospitalization 01/2015 - active ileitis/colitis, f/u 2 yrs ([place]). Has established with Dr [name] Prostate cancer screening - not due yet  DEXA scan - 11/2015 T -2.9 Osteoporosis. Takes calcium/vit D.  Flu shot yearly Tetanus shot - unsure Pneumovax 2017 Shingles shot - not due yet Seat belt use discussed Sunscreen use discussed. No changing moles on skin.  Non smoker. Rare cigar.  Alcohol - a few beers a week  Lives with wife, 1 dog. Grown children  Edu: college  Occ: owns mattress store  Activity: treadmill 5d/wk 30 min Diet: good water, fruits/vegetables daily   Relevant past medical, surgical, family and social history reviewed and updated as indicated. Interim medical history since our last visit reviewed. Allergies and medications reviewed and updated. Outpatient Medications Prior to Visit  Medication Sig Dispense Refill  . amLODipine (NORVASC) 5 MG tablet Take 1 tablet (5 mg total) by mouth daily. 30 tablet 2  . azaTHIOprine (IMURAN) 50 MG tablet Take 150 mg by mouth.    . Calcium Carbonate-Vit D-Min (CALCIUM 1200 PO) Take 1 capsule by mouth 2 (two) times daily.    [name] dicyclomine (BENTYL) 10 MG capsule Take 1 capsule (10 mg total) by mouth every 6 (six) hours as needed for spasms (cramping). 60 capsule 0  . diphenoxylate-atropine (LOMOTIL) 2.5-0.025 MG tablet Take 1-2 tablets by mouth four times daily as  needed 240 tablet 2  . Multiple Vitamin (MULTIVITAMIN) capsule Take 1 capsule by mouth daily.    [name] oxyCODONE (OXY IR/ROXICODONE) 5 MG immediate release tablet Take 1 tablet (5 mg total) by mouth every 4 (four) hours as needed for moderate pain. 30 tablet 0  . predniSONE (DELTASONE) 20 MG tablet Take 20 mg by mouth daily with breakfast.    . tadalafil (CIALIS) 5 MG tablet Take 1 tablet (5 mg total) by mouth daily as needed for erectile dysfunction. 30 tablet 2  . tamsulosin (FLOMAX) 0.4 MG CAPS capsule Take 1 capsule (0.4 mg total) by mouth daily. 30 capsule 2  . hydrOXYzine (ATARAX/VISTARIL) 25 MG tablet TAKE UP TO 4 TABLETS BY MOUTH ONCE DAILY AT BEDTIME AS NEEDED FOR ITCHING. 30 tablet 2  . mercaptopurine (PURINETHOL) 50 MG tablet TAKE 2 TABS BY MOUTH DAILY, GIVE ON EMPTY STOMACH 1 HR BEFORE OR 2 HRS AFTER A MEALS 60 tablet 1  . methocarbamol (ROBAXIN) 500 MG tablet Take 1 tablet (500 mg total) by mouth 2 (two) times daily as needed for muscle spasms (sedation precautions). 30 tablet 0  . predniSONE (DELTASONE) 1 MG tablet TAKE 1 TABLET (1 MG TOTAL) BY MOUTH AS DIRECTED. 7 TABLETS DAILY MAX 200 tablet 0  . budesonide-formoterol (SYMBICORT) 160-4.5 MCG/ACT inhaler Inhale 2 puffs into the lungs 2 (two) times daily. 1 Inhaler 3  . MERCAPTOPURINE PO Take 100 mg by mouth daily.  No facility-administered medications prior to visit.      Per HPI unless specifically indicated in ROS section below Review of Systems  Constitutional: Negative for activity change, appetite change, chills, fatigue, fever and unexpected weight change.  HENT: Negative for hearing loss.   Eyes: Negative for visual disturbance.  Respiratory: Negative for cough, chest tightness, shortness of breath and wheezing.   Cardiovascular: Negative for chest pain, palpitations and leg swelling.  Gastrointestinal: Negative for abdominal distention, abdominal pain, blood in stool, constipation, diarrhea, nausea and vomiting.    Genitourinary: Negative for difficulty urinating and hematuria.  Musculoskeletal: Negative for arthralgias, myalgias and neck pain.  Skin: Negative for rash.  Neurological: Negative for dizziness, seizures, syncope and headaches.  Hematological: Negative for adenopathy. Does not bruise/bleed easily.  Psychiatric/Behavioral: Negative for dysphoric mood. The patient is not nervous/anxious.        Objective:    BP 124/70 (BP Location: Left Arm, Patient Position: Sitting, Cuff Size: Normal)   Pulse 79   Temp 98.7 F (37.1 C) (Oral)   Ht 5' 6.5" (1.689 m)   Wt 165 lb (74.8 kg)   SpO2 96%   BMI 26.23 kg/m   Wt Readings from Last 3 Encounters:  03/20/17 165 lb (74.8 kg)  01/08/17 167 lb 8 oz (76 kg)  12/10/16 168 lb (76.2 kg)    Physical Exam  Constitutional: He is oriented to person, place, and time. He appears well-developed and well-nourished. No distress.  HENT:  Head: Normocephalic and atraumatic.  Right Ear: Hearing, tympanic membrane, external ear and ear canal normal.  Left Ear: Hearing, tympanic membrane, external ear and ear canal normal.  Nose: Nose normal.  Mouth/Throat: Uvula is midline, oropharynx is clear and moist and mucous membranes are normal. No oropharyngeal exudate, posterior oropharyngeal edema or posterior oropharyngeal erythema.  Eyes: Conjunctivae and EOM are normal. Pupils are equal, round, and reactive to light. No scleral icterus.  Neck: Normal range of motion. Neck supple. No thyromegaly present.  Cardiovascular: Normal rate, regular rhythm, normal heart sounds and intact distal pulses.  No murmur heard. Pulses:      Radial pulses are 2+ on the right side, and 2+ on the left side.  Pulmonary/Chest: Effort normal and breath sounds normal. No respiratory distress. He has no wheezes. He has no rales.  Abdominal: Soft. Bowel sounds are normal. He exhibits no distension and no mass. There is no tenderness. There is no rebound and no guarding.   Genitourinary: Rectum normal. Rectal exam shows no external hemorrhoid, no fissure, no mass, no tenderness and anal tone normal. Prostate is enlarged (40gm). Prostate is not tender.  Musculoskeletal: Normal range of motion. He exhibits no edema.  Lymphadenopathy:    He has no cervical adenopathy.  Neurological: He is alert and oriented to person, place, and time.  CN grossly intact, station and gait intact  Skin: Skin is warm and dry. No rash noted.  Psychiatric: He has a normal mood and affect. His behavior is normal. Judgment and thought content normal.  Nursing note and vitals reviewed.  Results for orders placed or performed in visit on 12/10/16  ANCA Screen Reflex Titer  Result Value Ref Range   ANCA Screen C-ANCA POS (A) NEGATIVE   C-ANCA Titer 1:160 (H) <1:20 titer  No results found for: PSA     Assessment & Plan:   Problem List Items Addressed This Visit    Benign prostatic hyperplasia    DRE/PSA today. Continue flomax.  Relevant Orders   PSA   Crohn disease ([place])    Established with Dr [name]. Appreciate GI care. Stable period on prednisone 46m daily. Recently started on imuran       Essential hypertension    Chronic, stable. Continue current regimen.      Relevant Orders   Lipid panel   Healthcare maintenance - Primary    Preventative protocols reviewed and updated unless pt declined. Discussed healthy diet and lifestyle.       Osteoporosis    Chronic, regular with cal/vit D BID.  Discussed bisphosphonates vs prolia and pros/cons including atypical fracture risk. Pt desires to f/u with rheum - considering reclast.  Endorses regular weight bearing exercise.       Relevant Orders   VITAMIN D 25 Hydroxy (Vit-D Deficiency, Fractures)   [place] ([place]    ?churg-strauss, Appreciate [facility] rheum care. Will await eval.        Other Visit Diagnoses    Hyperglycemia       Relevant Orders   Hemoglobin AZ4Q  Basic metabolic panel       Follow up  plan: Return in about 1 year (around 03/20/2018) for annual exam, prior fasting for blood work.  JRia Bush MD

## 2017-03-20 NOTE — Assessment & Plan Note (Addendum)
Chronic, regular with cal/vit D BID.  Discussed bisphosphonates vs prolia and pros/cons including atypical fracture risk. Pt desires to f/u with rheum - considering reclast.  Endorses regular weight bearing exercise.

## 2017-03-20 NOTE — Assessment & Plan Note (Signed)
Preventative protocols reviewed and updated unless pt declined. Discussed healthy diet and lifestyle.  

## 2017-03-21 ENCOUNTER — Other Ambulatory Visit: Payer: Self-pay | Admitting: Family Medicine

## 2017-03-21 DIAGNOSIS — R972 Elevated prostate specific antigen [PSA]: Secondary | ICD-10-CM | POA: Insufficient documentation

## 2017-04-03 ENCOUNTER — Other Ambulatory Visit: Payer: Self-pay | Admitting: Internal Medicine

## 2017-04-03 ENCOUNTER — Other Ambulatory Visit (INDEPENDENT_AMBULATORY_CARE_PROVIDER_SITE_OTHER): Payer: BLUE CROSS/BLUE SHIELD

## 2017-04-03 DIAGNOSIS — R972 Elevated prostate specific antigen [PSA]: Secondary | ICD-10-CM | POA: Diagnosis not present

## 2017-04-06 LAB — REFLEX PSA, FREE
PSA, % Free: 12 % (calc) — ABNORMAL LOW (ref >25)
PSA, Free: 0.8 ng/mL

## 2017-04-06 LAB — PSA, TOTAL WITH REFLEX TO PSA, FREE: PSA, Total: 6.6 ng/mL — ABNORMAL HIGH (ref <=4.0)

## 2017-04-08 ENCOUNTER — Other Ambulatory Visit: Payer: Self-pay | Admitting: Family Medicine

## 2017-04-08 DIAGNOSIS — R972 Elevated prostate specific antigen [PSA]: Secondary | ICD-10-CM

## 2017-04-09 ENCOUNTER — Telehealth: Payer: Self-pay

## 2017-04-09 NOTE — Telephone Encounter (Signed)
Left message on vm per dpr asking pt to call back to schedule a 3 month lab visit (around 1st wk of April 2019) per Dr. Darnell Level to recheck prostate.  Orders are in.

## 2017-04-10 ENCOUNTER — Telehealth: Payer: Self-pay

## 2017-04-10 MED ORDER — HYDROXYZINE HCL 25 MG PO TABS: ORAL_TABLET | ORAL | 2 refills | Status: DC

## 2017-04-10 NOTE — Telephone Encounter (Signed)
Spoke with pt to get clarification of request. His chart initially showed rx was sent to [facility] 03/27/17.  However, pt states he never received rx from them and wanted [facility] [place] [facility].  [facility] [place] [address] Ree Kida pt.  Says ok and expresses his thanks.

## 2017-04-10 NOTE — Telephone Encounter (Signed)
Copied from [place] [id number]. Topic: General - Other >> Apr 10, 2017  2:14 PM Carolyn Stare wrote:  Pt call and said the below med was called into the wrong pharmacy and ask if it could be called in today     hydrOXYzine (ATARAX/VISTARIL) 25 MG tablet   [facility]

## 2017-04-17 ENCOUNTER — Telehealth: Payer: Self-pay | Admitting: Family Medicine

## 2017-04-17 NOTE — Telephone Encounter (Signed)
>>   Apr 16, 2017  1:25 PM Jack [name], Helene Kelp D wrote: Patient called back regarding the [facility] [place]. He said that he takes 4 pills at night every day. The quantity sent in is not enough for him because in a month patient takes 120 tabs. Patient would like to talk to someone about this. Please call patient back, thanks. This is in reference to the hydrOXYzine.

## 2017-04-18 ENCOUNTER — Other Ambulatory Visit: Payer: Self-pay | Admitting: Family Medicine

## 2017-04-20 NOTE — Telephone Encounter (Signed)
Last filled:  04/17/17, #30 Last OV (CPE):  03/20/17 Next OV:  None  A note from the pharmacy is saying the pt states he takes 4 tabs daily, not daily as needed.  Left message on vm per dpr asking pt to call back confirming how he takes this med.

## 2017-04-20 NOTE — Telephone Encounter (Signed)
Pt states he takes 4 tabs at bedtime every night.Pt states prescription states he can take up to four and he has tried to take less but it does not work. Pt states he only takes the medication at night.

## 2017-04-21 NOTE — Telephone Encounter (Signed)
Noted  

## 2017-04-23 ENCOUNTER — Encounter: Payer: Self-pay | Admitting: Internal Medicine

## 2017-04-29 ENCOUNTER — Other Ambulatory Visit: Payer: Self-pay | Admitting: Family Medicine

## 2017-04-29 DIAGNOSIS — K5090 Crohn's disease, unspecified, without complications: Secondary | ICD-10-CM | POA: Diagnosis not present

## 2017-04-30 ENCOUNTER — Telehealth: Payer: Self-pay | Admitting: Family Medicine

## 2017-04-30 NOTE — Telephone Encounter (Unsigned)
Copied from Central. Topic: Quick Communication - See Telephone Encounter >> Apr 20, 2017  2:48 PM Brenton Grills, [place] wrote: CRM for notification. See Refill encounter for:   04/18/17.  PEC Triage Nurse may collect info from pt on how he takes the hydroxyzine. >> Apr 30, 2017 11:34 AM Neva Seat wrote: Pt called back today frustrated because he has not received a call to discuss or a refill on his medication. Please call pt ASAP to resolve the refill issue.    The previous CRM/Telephone encounter below explains the situation:  Patient called back regarding [facility] [address] He said that he takes 4 pills at night every day. The quantity sent in is not enough for him because in a month patient takes 120 tabs. Patient would like to talk to someone about this. Please call patient back, thanks. This is in reference to the hydrOXYzine.

## 2017-04-30 NOTE — Telephone Encounter (Signed)
[facility] and [facility] does [facility]zine ready to be picked up. It was e-prescribed 04/23/17 #120 tabs with 3 refills.  Called and left message on pt's answering machine of above information. Asked pt to call back for any further issues.

## 2017-06-09 ENCOUNTER — Ambulatory Visit: Payer: BLUE CROSS/BLUE SHIELD | Admitting: Family Medicine

## 2017-06-09 ENCOUNTER — Encounter: Payer: Self-pay | Admitting: Family Medicine

## 2017-06-09 VITALS — BP 120/70 | HR 74 | Temp 98.0°F | Wt 164.0 lb

## 2017-06-09 DIAGNOSIS — B356 Tinea cruris: Secondary | ICD-10-CM | POA: Insufficient documentation

## 2017-06-09 MED ORDER — TADALAFIL 5 MG PO TABS: 5.0000 mg | ORAL_TABLET | Freq: Every day | ORAL | 3 refills | Status: DC | PRN

## 2017-06-09 MED ORDER — CLOTRIMAZOLE 1 % EX CREA: 1.0000 "application " | TOPICAL_CREAM | Freq: Two times a day (BID) | CUTANEOUS | 0 refills | Status: DC

## 2017-06-09 NOTE — Assessment & Plan Note (Signed)
Reviewed with patient. rec lotrimin BID x 2 wks, update with effect, if not improved consider lotrisone. Pt agrees with plan.

## 2017-06-09 NOTE — Patient Instructions (Signed)
You do have fungal infection of groin - treat with lotrimin antifungal cream twice daily for 2 weeks. Call me if no better for trial stronger medicine.   Jock Itch Jock itch is an infection of the skin in the groin area. It is caused by a type of germ (fungus). The infection causes a rash and itching in the groin and upper thigh. It is common in people who play sports. Being in places with hot weather and wearing tight or wet clothes can increase the chance of getting jock itch. The rash usually goes away in 2-3 weeks with treatment. Follow these instructions at home:  Take medicines only as told by your doctor. Apply skin creams or ointments exactly as told.  Wear loose-fitting clothing. ? Men should wear cotton boxer shorts. ? Women should wear cotton underwear.  Change your underwear every day to keep your groin dry.  Avoid hot baths.  Dry your groin area well after you take a bath or shower. ? Use a separate towel to dry your groin area. This will help to prevent a spreading of the infection to other areas of your body.  Do not scratch the affected area.  Do not share towels with other people. Contact a doctor if:  Your rash does not improve or it gets worse after 2 weeks of treatment.  Your rash is spreading.  Your rash comes back after treatment is finished.  You have a fever.  You have redness, swelling, or pain in the area around your rash.  You have fluid, blood, or pus coming from your rash.  Your have your rash for more than 4 weeks. This information is not intended to replace advice given to you by your health care provider. Make sure you discuss any questions you have with your health care provider. Document Released: 06/11/2009 Document Revised: 08/23/2015 Document Reviewed: 12/27/2013 Elsevier Interactive Patient Education  [employer].

## 2017-06-09 NOTE — Progress Notes (Signed)
BP 120/70 (BP Location: Left Arm, Patient Position: Sitting, Cuff Size: Normal)   Pulse 74   Temp 98 F (36.7 C) (Oral)   Wt 164 lb (74.4 kg)   SpO2 98%   BMI 26.07 kg/m    CC: thigh skin rash Subjective:    Patient ID: Jack [name], male    DOB: 06/13/1965, 52 y.o.   MRN: [id number]  HPI: Jack [name] is a 52 y.o. male presenting on 06/09/2017 for Rash (Located on anterior upper LEs for about 2 wks. Area is a little itchy and circular in shape. Tried antifungal topical, not helpful.)   2 wk h/o upper thigh started on right, now progressed to left side. Red rash. Mildly itchy. No pain. He has tried OTC antifungal for athelete's foot without improvement.   He currently has fever blister.  No other rash, no oral lesions. No fevers/chills, nausea, joint pains.  No new lotions ,detergents, soaps or shampoos. No new meds. No new foods. No new supplements.  Eosinophilia with crohn's disease ?churg-strauss syndrome. Has been referred to [facility]. Planned f/u 06/2017.   Relevant past medical, surgical, family and social history reviewed and updated as indicated. Interim medical history since our last visit reviewed. Allergies and medications reviewed and updated. Outpatient Medications Prior to Visit  Medication Sig Dispense Refill  . amLODipine (NORVASC) 5 MG tablet Take 1 tablet (5 mg total) by mouth daily. 30 tablet 2  . azaTHIOprine (IMURAN) 50 MG tablet Take 150 mg by mouth.    . Calcium Carbonate-Vit D-Min (CALCIUM 1200 PO) Take 1 capsule by mouth 2 (two) times daily.    [name] dicyclomine (BENTYL) 10 MG capsule Take 1 capsule (10 mg total) by mouth every 6 (six) hours as needed for spasms (cramping). 60 capsule 0  . diphenoxylate-atropine (LOMOTIL) 2.5-0.025 MG tablet Take 1-2 tablets by mouth four times daily as needed 240 tablet 2  . hydrOXYzine (ATARAX/VISTARIL) 25 MG tablet Take 4 tablets (100 mg total) by mouth at bedtime. 120 tablet 3  . Multiple Vitamin  (MULTIVITAMIN) capsule Take 1 capsule by mouth daily.    [name] oxyCODONE (OXY IR/ROXICODONE) 5 MG immediate release tablet Take 1 tablet (5 mg total) by mouth every 4 (four) hours as needed for moderate pain. 30 tablet 0  . predniSONE (DELTASONE) 20 MG tablet Take 20 mg by mouth daily with breakfast.    . tamsulosin (FLOMAX) 0.4 MG CAPS capsule TAKE 1 CAPSULE (0.4 MG TOTAL) BY MOUTH DAILY. 30 capsule 11  . Vedolizumab (ENTYVIO IV) Inject into the vein every 8 (eight) weeks.    . tadalafil (CIALIS) 5 MG tablet Take 1 tablet (5 mg total) by mouth daily as needed for erectile dysfunction. 30 tablet 2  . tamsulosin (FLOMAX) 0.4 MG CAPS capsule Take 1 capsule (0.4 mg total) by mouth daily. 30 capsule 2   No facility-administered medications prior to visit.      Per HPI unless specifically indicated in ROS section below Review of Systems     Objective:    BP 120/70 (BP Location: Left Arm, Patient Position: Sitting, Cuff Size: Normal)   Pulse 74   Temp 98 F (36.7 C) (Oral)   Wt 164 lb (74.4 kg)   SpO2 98%   BMI 26.07 kg/m   Wt Readings from Last 3 Encounters:  06/09/17 164 lb (74.4 kg)  03/20/17 165 lb (74.8 kg)  01/08/17 167 lb 8 oz (76 kg)    Physical Exam  Constitutional: He appears  well-developed and well-nourished. No distress.  Skin: Skin is warm and dry. Rash noted.  Hyperpigmented annular rash of inner groin bilaterally with scaling and central clearing Scrotum not involved  Nursing note and vitals reviewed.      Assessment & Plan:   Problem List Items Addressed This Visit    Tinea cruris - Primary    Reviewed with patient. rec lotrimin BID x 2 wks, update with effect, if not improved consider lotrisone. Pt agrees with plan.      Relevant Medications   clotrimazole (LOTRIMIN AF) 1 % cream       Meds ordered this encounter  Medications  . clotrimazole (LOTRIMIN AF) 1 % cream    Sig: Apply 1 application topically 2 (two) times daily.    Dispense:  60 g    Refill:   0  . tadalafil (CIALIS) 5 MG tablet    Sig: Take 1 tablet (5 mg total) by mouth daily as needed for erectile dysfunction.    Dispense:  30 tablet    Refill:  3   No orders of the defined types were placed in this encounter.   Follow up plan: No Follow-up on file.  Jack Bush, MD

## 2017-06-25 DIAGNOSIS — K5090 Crohn's disease, unspecified, without complications: Secondary | ICD-10-CM | POA: Diagnosis not present

## 2017-07-06 ENCOUNTER — Telehealth: Payer: Self-pay | Admitting: Internal Medicine

## 2017-07-06 NOTE — Telephone Encounter (Signed)
Left message for pt to call back  °

## 2017-07-06 NOTE — Telephone Encounter (Signed)
Patient states he is having gi symptoms and he does not know if it's due to his crohn's or medication prednisone. Patient would like a call to discuss.

## 2017-07-08 ENCOUNTER — Other Ambulatory Visit: Payer: Self-pay | Admitting: Family Medicine

## 2017-07-08 NOTE — Telephone Encounter (Signed)
Left message for pt to call back.  Pt states that he has been on prednisone for 7 years straight. States that when he gets down to 64m daily he feels like he has pneumonia. Pt reports he is currently taking 28mdaily but for about 1 week he has been having coughing and sneezing, reports he is coughing up green/brown sputum. States that once he takes the prednisone, in about 3 hours he feels better and the coughing/sneezing stops. Pt wasn't sure which doctor he needed to call regarding this. Please advise.

## 2017-07-08 NOTE — Telephone Encounter (Signed)
Left message for pt, let him know he needs to contact his Rheum at [facility] and that we will let him know when we hear back from Pulmonary.

## 2017-07-08 NOTE — Telephone Encounter (Signed)
Copied from [place] [id number]. Topic: Quick Communication - Rx Refill/Question >> Jul 08, 2017  4:34 PM Arletha Grippe wrote: Medication: hydrOXYzine (ATARAX/VISTARIL) 25 MG tablet Has the patient contacted their pharmacy? Yes.   (Agent: If no, request that the patient contact the pharmacy for the refill.) [facility] [address]   Agent: Please be advised that RX refills may take up to 3 business days. We ask that you follow-up with your pharmacy.  Pt takes 4 pills every night  needs 30 day supply for that qty.  Pt was only given 30 pills total   And pt needs refill on prednisone 20 mg, [facility] [place] [facility]

## 2017-07-08 NOTE — Telephone Encounter (Signed)
He has seen Dr. Lake Bells here with pulmonology and also Rheum at [facility] I will CC: Dr. Lake Bells for his opinion here regarding the productive cough, but his rheumatologist needs to be involved also as this is a consequence of his vasculitis

## 2017-07-09 MED ORDER — HYDROXYZINE HCL 25 MG PO TABS: 100.0000 mg | ORAL_TABLET | Freq: Every day | ORAL | 3 refills | Status: DC

## 2017-07-09 NOTE — Telephone Encounter (Signed)
Sent refill for hydroxyzine.  [facility] [address] asking about pt only getting 30 tabs.  Told pt would get the rest of rx filled today.  Offered apology but no explanation as to why pt was only given 30 tabs.

## 2017-07-10 MED ORDER — PREDNISONE 20 MG PO TABS: 20.0000 mg | ORAL_TABLET | Freq: Every day | ORAL | 3 refills | Status: DC

## 2017-07-10 NOTE — Telephone Encounter (Signed)
Prednisone refilled. plz notify patient as well as notify about what you found out with pharmacy as we sent it in correctly.

## 2017-07-10 NOTE — Telephone Encounter (Signed)
Left message on vm per dpr [facility] concerning [facility].

## 2017-07-27 DIAGNOSIS — K5190 Ulcerative colitis, unspecified, without complications: Secondary | ICD-10-CM | POA: Diagnosis not present

## 2017-07-27 DIAGNOSIS — R05 Cough: Secondary | ICD-10-CM | POA: Diagnosis not present

## 2017-07-27 DIAGNOSIS — M810 Age-related osteoporosis without current pathological fracture: Secondary | ICD-10-CM | POA: Diagnosis not present

## 2017-07-27 DIAGNOSIS — D721 Eosinophilia: Secondary | ICD-10-CM | POA: Diagnosis not present

## 2017-07-27 DIAGNOSIS — I10 Essential (primary) hypertension: Secondary | ICD-10-CM | POA: Diagnosis not present

## 2017-07-27 DIAGNOSIS — J069 Acute upper respiratory infection, unspecified: Secondary | ICD-10-CM | POA: Diagnosis not present

## 2017-07-27 DIAGNOSIS — I776 Arteritis, unspecified: Secondary | ICD-10-CM | POA: Diagnosis not present

## 2017-07-27 DIAGNOSIS — K5090 Crohn's disease, unspecified, without complications: Secondary | ICD-10-CM | POA: Diagnosis not present

## 2017-07-27 DIAGNOSIS — Z6825 Body mass index (BMI) 25.0-25.9, adult: Secondary | ICD-10-CM | POA: Diagnosis not present

## 2017-07-27 DIAGNOSIS — Z7952 Long term (current) use of systemic steroids: Secondary | ICD-10-CM | POA: Diagnosis not present

## 2017-07-27 DIAGNOSIS — Z886 Allergy status to analgesic agent status: Secondary | ICD-10-CM | POA: Diagnosis not present

## 2017-08-17 DIAGNOSIS — K5090 Crohn's disease, unspecified, without complications: Secondary | ICD-10-CM | POA: Diagnosis not present

## 2017-08-20 DIAGNOSIS — K5090 Crohn's disease, unspecified, without complications: Secondary | ICD-10-CM | POA: Diagnosis not present

## 2017-08-25 NOTE — Telephone Encounter (Signed)
Left message on vm per dpr informing pt he is overdue for PSA labs.  Asked him to call back to schedule lab visit.

## 2017-08-25 NOTE — Telephone Encounter (Signed)
plz call to schedule 3 mo f/u PSA level - overdue.

## 2017-08-26 NOTE — Telephone Encounter (Signed)
Left message on vm per dpr informing pt he is overdue for PSA labs.  Asked him to call back to schedule lab visit.

## 2017-08-27 NOTE — Telephone Encounter (Signed)
Left message on vm per dpr informing pt he is overdue for PSA labs.  Asked him to call back to schedule lab visit.

## 2017-08-28 NOTE — Telephone Encounter (Signed)
Sent pt a message via MyChart relaying Dr. Synthia Innocent message and asking pt to call to schedule lab visit.

## 2017-09-07 ENCOUNTER — Other Ambulatory Visit: Payer: Self-pay | Admitting: Family Medicine

## 2017-09-07 MED ORDER — PREDNISONE 10 MG PO TABS: 20.0000 mg | ORAL_TABLET | Freq: Every day | ORAL | 3 refills | Status: DC

## 2017-09-07 NOTE — Telephone Encounter (Signed)
It seems that patient does not check mychart. plz send letter by mail. Thanks

## 2017-09-08 NOTE — Telephone Encounter (Signed)
Mailed a letter

## 2017-09-21 ENCOUNTER — Telehealth: Payer: Self-pay | Admitting: Family Medicine

## 2017-09-21 NOTE — Telephone Encounter (Signed)
Copied from [place] [id number]. Topic: Quick Communication - Rx Refill/Question >> Sep 21, 2017  3:10 PM Synthia Innocent wrote: Medication: amLODipine (NORVASC) 5 MG tablet   Has the patient contacted their pharmacy? Yes.   (Agent: If no, request that the patient contact the pharmacy for the refill.) (Agent: If yes, when and what did the pharmacy advise?)  [facility]erred Pharm[facility]with phone [facility] [address]   Agent: Please be advised that RX refills may take up to 3 business days. We ask that you follow-up with your pharmacy.

## 2017-09-22 MED ORDER — AMLODIPINE BESYLATE 5 MG PO TABS: 5.0000 mg | ORAL_TABLET | Freq: Every day | ORAL | 2 refills | Status: DC

## 2017-10-19 DIAGNOSIS — K5090 Crohn's disease, unspecified, without complications: Secondary | ICD-10-CM | POA: Diagnosis not present

## 2017-10-26 ENCOUNTER — Ambulatory Visit: Payer: BLUE CROSS/BLUE SHIELD | Admitting: Sports Medicine

## 2017-10-26 ENCOUNTER — Ambulatory Visit
Admission: RE | Admit: 2017-10-26 | Discharge: 2017-10-26 | Disposition: A | Discharge status: Home / Self Care | Payer: BLUE CROSS/BLUE SHIELD | Source: Ambulatory Visit | Attending: Sports Medicine | Admitting: Sports Medicine

## 2017-10-26 ENCOUNTER — Other Ambulatory Visit: Payer: BLUE CROSS/BLUE SHIELD

## 2017-10-26 VITALS — BP 142/88 | Ht 67.0 in | Wt 160.0 lb

## 2017-10-26 DIAGNOSIS — M5416 Radiculopathy, lumbar region: Secondary | ICD-10-CM

## 2017-10-26 DIAGNOSIS — R972 Elevated prostate specific antigen [PSA]: Secondary | ICD-10-CM

## 2017-10-26 MED ORDER — GABAPENTIN 300 MG PO CAPS: 300.0000 mg | ORAL_CAPSULE | Freq: Every evening | ORAL | 0 refills | Status: DC | PRN

## 2017-10-27 NOTE — Addendum Note (Signed)
Addended by: Jolinda Croak E on: 10/27/2017 10:39 AM   Modules accepted: Orders

## 2017-10-27 NOTE — Progress Notes (Signed)
   Subjective:    Patient ID: Jack [name], male    DOB: 1965-11-03, 52 y.o.   MRN: [id number]  HPI chief complaint: Right calf pain  P[place] 52 year old male comes in today complaining of 1 month of right calf pain. He has a rather complicated medical history. He had a similar complaint several years ago which basically went undiagnosed. He describes a pain that would began in the left calf but at times involve the entire left leg. He used to be an avid runner and he noticed that running never bothered him. His pain was most severe at night when lying still. He noticed that if he were to get up and walk his pain would improve but as soon as he lay back down his pain would intensify once again. As a result of this he was actually out of work for about a year. It sounds like he may have undergone a laminectomy after being told that his symptoms may have been originating from his lumbar spine. However, his pain did not improve postoperatively. He subsequently underwent a spinal stimulator for pain control.Over a period of several years his left leg pain eventually resolved. He did give up running but decided to take it up again about a month ago. As a result, he began to get similar pain in his right calf. No swelling. No numbness or tingling. No low back pain. His previous workup was done at [facility] many years ago. He is asking about what types of exercise he might try in the future.  Past medical history reviewed Medications reviewed Allergies reviewed    Review of Systems As above    Objective:   Physical Exam  Well-developed, well-nourished. No acute distress. Awake alert and oriented 3. Vital signs reviewed.  Examination of the right calf shows no tenderness to palpation. No swelling. No ecchymosis.  Neurological exam shows no noticeable atrophy. His strength is 5/5 in both lower extremities. Reflexes are brisk and equal at the Achilles and patellar tendons bilaterally. Sensation is  intact to light touch grossly.  X-rays of the lumbar spine are normal      Assessment & Plan:   Acute right calf pain of unknown etiology  I did discuss the possibility of proceeding with a CT scan of his lumbar spine to rule out a lumbar disc herniation (he cannot have an MRI due to his implantable spinal stimulator). I also explained to him the possibility that this may no tbe neurological at all. I would like to avoid an unnecessary and costly  workup. My best advice is that he simply give up running and try to focus on other forms of exercise. I recommended that he start with something like biking or swimming. He obviously had difficulty with the left leg many years ago which led to a lengthy recovery. It seems to me that running is what triggers his symptoms and he is fine with other activities. If he decides to proceed with CT scan, I will call him with those results when available. We will also try a short course of gabapentin at night.

## 2017-10-28 ENCOUNTER — Ambulatory Visit: Payer: BLUE CROSS/BLUE SHIELD | Admitting: Family Medicine

## 2017-10-28 ENCOUNTER — Encounter

## 2017-10-28 ENCOUNTER — Other Ambulatory Visit: Payer: Self-pay | Admitting: Family Medicine

## 2017-10-28 NOTE — Telephone Encounter (Signed)
I spoke with [name] at [facility] and pt already has available refills and [name] will get rx ready for pick up. Pt voiced understanding.

## 2017-10-28 NOTE — Telephone Encounter (Signed)
Copied from [place] [id number]. Topic: Inquiry >> Oct 28, 2017  8:37 AM Pricilla Handler wrote: Reason for CRM: Patient called requesting a refill of AmLODipine (NORVASC) 5 MG tablet. [facility] [place] [place] [phone] (Phone)  [phone] (Fax).       Thank You!!!

## 2017-10-28 NOTE — Telephone Encounter (Signed)
Copied from [place] [id number]. Topic: Inquiry >> Oct 28, 2017  8:37 AM Pricilla Handler wrote: Reason for CRM: Patient called requesting a refill of AmLODipine (NORVASC) 5 MG tablet. [facility] [place], [place] [phone] (Phone)  [phone] (Fax).       Thank You!!!

## 2017-11-05 ENCOUNTER — Other Ambulatory Visit: Payer: Self-pay | Admitting: Family Medicine

## 2017-11-06 ENCOUNTER — Telehealth: Payer: Self-pay | Admitting: Sports Medicine

## 2017-11-06 NOTE — Telephone Encounter (Signed)
  I left a message on Jack [name]'s voicemail yesterday. The CT scan of his lumbar spine that I ordered was denied by his insurance. They require 6 weeks of treatment prior to authorizing the study. We can have him return to the office to educate him in a home exercise program and then reconsider ordering the CT scan 6-8 weeks later if his symptoms have not resolved.

## 2017-11-09 ENCOUNTER — Other Ambulatory Visit: Payer: BLUE CROSS/BLUE SHIELD

## 2017-11-18 ENCOUNTER — Telehealth: Payer: Self-pay

## 2017-11-18 NOTE — Telephone Encounter (Signed)
Discussed with the pt that when the 6 weeks is up (around 12/07/2017) he will call our office and update [place] on how he's doing. Based on that we will resubmit his CT scan for approval to the insurance company. Pt agrees with this plan.

## 2017-11-23 ENCOUNTER — Other Ambulatory Visit: Payer: Self-pay

## 2017-11-23 MED ORDER — GABAPENTIN 300 MG PO CAPS: 300.0000 mg | ORAL_CAPSULE | Freq: Every evening | ORAL | 0 refills | Status: DC | PRN

## 2017-12-14 DIAGNOSIS — K5090 Crohn's disease, unspecified, without complications: Secondary | ICD-10-CM | POA: Diagnosis not present

## 2017-12-16 ENCOUNTER — Telehealth: Payer: Self-pay

## 2017-12-16 NOTE — Telephone Encounter (Signed)
Informed pt that we will try to get his insurance to approve the CT scan again. If successful we will let him know and he will call to reschedule exam. Pt understands.

## 2017-12-17 ENCOUNTER — Other Ambulatory Visit: Payer: BLUE CROSS/BLUE SHIELD

## 2017-12-27 ENCOUNTER — Other Ambulatory Visit: Payer: Self-pay | Admitting: Family Medicine

## 2017-12-28 ENCOUNTER — Other Ambulatory Visit: Payer: Self-pay | Admitting: *Deleted

## 2017-12-28 MED ORDER — GABAPENTIN 300 MG PO CAPS: 300.0000 mg | ORAL_CAPSULE | Freq: Every evening | ORAL | 0 refills | Status: DC | PRN

## 2017-12-29 ENCOUNTER — Encounter: Payer: Self-pay | Admitting: Internal Medicine

## 2018-01-01 ENCOUNTER — Ambulatory Visit
Admission: RE | Admit: 2018-01-01 | Discharge: 2018-01-01 | Disposition: A | Discharge status: Home / Self Care | Payer: BLUE CROSS/BLUE SHIELD | Source: Ambulatory Visit | Attending: Sports Medicine | Admitting: Sports Medicine

## 2018-01-01 DIAGNOSIS — M5416 Radiculopathy, lumbar region: Secondary | ICD-10-CM

## 2018-01-01 DIAGNOSIS — M5116 Intervertebral disc disorders with radiculopathy, lumbar region: Secondary | ICD-10-CM | POA: Diagnosis not present

## 2018-01-04 DIAGNOSIS — Z6825 Body mass index (BMI) 25.0-25.9, adult: Secondary | ICD-10-CM | POA: Diagnosis not present

## 2018-01-04 DIAGNOSIS — Z23 Encounter for immunization: Secondary | ICD-10-CM | POA: Diagnosis not present

## 2018-01-04 DIAGNOSIS — I776 Arteritis, unspecified: Secondary | ICD-10-CM | POA: Diagnosis not present

## 2018-01-07 ENCOUNTER — Other Ambulatory Visit: Payer: Self-pay | Admitting: Immunology

## 2018-01-07 DIAGNOSIS — I776 Arteritis, unspecified: Secondary | ICD-10-CM

## 2018-01-12 ENCOUNTER — Telehealth: Payer: Self-pay | Admitting: Sports Medicine

## 2018-01-12 NOTE — Telephone Encounter (Signed)
  I spoke with Jack [name] on the phone today after reviewing the CT scan of his lumbar spine.  He has postoperative changes at L5-S1 with a 19 x 25 mm pseudomeningocele.  This does not appear to compress the nerve root or thecal sac.  Patient continues to endorse significant pain in his right leg especially with certain activities such as squatting for long periods of time.  I recommended that he discuss his CT scan with his neurosurgeon at [facility].  I am happy to send records or CT results as needed.  Otherwise, unfortunately, I do not have much else to add to his treatment at this point in time.  Follow-up with me as needed.

## 2018-01-15 ENCOUNTER — Telehealth: Payer: Self-pay

## 2018-01-15 NOTE — Telephone Encounter (Signed)
Notes faxed to Dr. Jinny Sanders.

## 2018-01-18 ENCOUNTER — Ambulatory Visit
Admission: RE | Admit: 2018-01-18 | Discharge: 2018-01-18 | Disposition: A | Discharge status: Home / Self Care | Payer: BLUE CROSS/BLUE SHIELD | Source: Ambulatory Visit | Attending: Immunology | Admitting: Immunology

## 2018-01-18 DIAGNOSIS — I776 Arteritis, unspecified: Secondary | ICD-10-CM

## 2018-01-18 DIAGNOSIS — M79661 Pain in right lower leg: Secondary | ICD-10-CM | POA: Diagnosis not present

## 2018-01-23 ENCOUNTER — Other Ambulatory Visit: Payer: Self-pay | Admitting: Family Medicine

## 2018-01-25 ENCOUNTER — Ambulatory Visit: Payer: BLUE CROSS/BLUE SHIELD | Admitting: Internal Medicine

## 2018-01-25 ENCOUNTER — Encounter: Payer: Self-pay | Admitting: Internal Medicine

## 2018-01-25 VITALS — BP 126/80 | HR 60 | Temp 97.9°F | Wt 169.0 lb

## 2018-01-25 DIAGNOSIS — S29011A Strain of muscle and tendon of front wall of thorax, initial encounter: Secondary | ICD-10-CM

## 2018-01-25 MED ORDER — NAPROXEN 500 MG PO TABS: 500.0000 mg | ORAL_TABLET | Freq: Two times a day (BID) | ORAL | 0 refills | Status: DC

## 2018-01-25 MED ORDER — CYCLOBENZAPRINE HCL 10 MG PO TABS: 10.0000 mg | ORAL_TABLET | Freq: Every evening | ORAL | 0 refills | Status: DC | PRN

## 2018-01-25 NOTE — Patient Instructions (Signed)
Muscle Strain A muscle strain (pulled muscle) happens when a muscle is stretched beyond normal length. It happens when a sudden, violent force stretches your muscle too far. Usually, a few of the fibers in your muscle are torn. Muscle strain is common in athletes. Recovery usually takes 1-2 weeks. Complete healing takes 5-6 weeks. Follow these instructions at home:  Follow the PRICE method of treatment to help your injury get better. Do this the first 2-3 days after the injury: ? Protect. Protect the muscle to keep it from getting injured again. ? Rest. Limit your activity and rest the injured body part. ? Ice. Put ice in a plastic bag. Place a towel between your skin and the bag. Then, apply the ice and leave it on from 15-20 minutes each hour. After the third day, switch to moist heat packs. ? Compression. Use a splint or elastic bandage on the injured area for comfort. Do not put it on too tightly. ? Elevate. Keep the injured body part above the level of your heart.  Only take medicine as told by your doctor.  Warm up before doing exercise to prevent future muscle strains. Contact a doctor if:  You have more pain or puffiness (swelling) in the injured area.  You feel numbness, tingling, or notice a loss of strength in the injured area. This information is not intended to replace advice given to you by your health care provider. Make sure you discuss any questions you have with your health care provider. Document Released: 12/25/2007 Document Revised: 08/23/2015 Document Reviewed: 10/14/2012 Elsevier Interactive Patient Education  2017 [employer].

## 2018-01-25 NOTE — Progress Notes (Signed)
Subjective:    Patient ID: Jack [name], male    DOB: 03-03-1966, 52 y.o.   MRN: [id number]  HPI  Pt presents to the clinic today with c/o left sided chest wall pain. He reports last Wednesday, he was moving something with a dolly. He felt a pop in his left chest. He describes the pain as sore, achy and pulling. It seems worse with taking a deep breath and sometimes movement. He denies left shoulder or arm pain. He denies cough or shortness of breath. He has tried Ibuprofen, Valium and Hydrocodone with minimal relief.   Review of Systems      Past Medical History:  Diagnosis Date  . BPH (benign prostatic hypertrophy)   . Colitis   . Colon polyp    inflammatory  . Crohn disease ([place]) 1992   history uveitis, involvement of intestines and lungs  . Diverticulosis   . Eosinophilic esophagitis   . Essential hypertension   . History of chicken pox   . History of gastroesophageal reflux (GERD)   . History of seizure 1995   grand mal x1, completed 6 yrs dilantin. no seizures since  . Internal hemorrhoids   . Osteoporosis 11/2015   DEXA T -2.9  . Schwannoma 2007   L axilla s/p surgery  . Ulnar neuropathy    h/o this from L arm schwannoma    Current Outpatient Medications  Medication Sig Dispense Refill  . amLODipine (NORVASC) 5 MG tablet TAKE 1 TABLET BY MOUTH EVERY DAY 30 tablet 2  . azaTHIOprine (IMURAN) 50 MG tablet Take 150 mg by mouth.    . Calcium Carbonate-Vit D-Min (CALCIUM 1200 PO) Take 1 capsule by mouth 2 (two) times daily.    . clotrimazole (LOTRIMIN AF) 1 % cream Apply 1 application topically 2 (two) times daily. 60 g 0  . dicyclomine (BENTYL) 10 MG capsule Take 1 capsule (10 mg total) by mouth every 6 (six) hours as needed for spasms (cramping). 60 capsule 0  . diphenoxylate-atropine (LOMOTIL) 2.5-0.025 MG tablet Take 1-2 tablets by mouth four times daily as needed 240 tablet 2  . gabapentin (NEURONTIN) 300 MG capsule Take 1 capsule (300 mg total) by mouth at  bedtime as needed. 30 capsule 0  . hydrOXYzine (ATARAX/VISTARIL) 25 MG tablet TAKE 4 TABLETS (100 MG TOTAL) BY MOUTH AT BEDTIME. 120 tablet 3  . Multiple Vitamin (MULTIVITAMIN) capsule Take 1 capsule by mouth daily.    . predniSONE (DELTASONE) 10 MG tablet Take 2 tablets (20 mg total) by mouth daily with breakfast. 60 tablet 3  . tadalafil (CIALIS) 5 MG tablet Take 1 tablet (5 mg total) by mouth daily as needed for erectile dysfunction. 30 tablet 3  . tamsulosin (FLOMAX) 0.4 MG CAPS capsule TAKE 1 CAPSULE (0.4 MG TOTAL) BY MOUTH DAILY. 30 capsule 11  . Vedolizumab (ENTYVIO IV) Inject into the vein every 8 (eight) weeks.    [name] alendronate (FOSAMAX) 70 MG tablet TAKE 1 TABLET (70 MG TOTAL) BY MOUTH EVERY SEVEN (7) DAYS.  11  . cyclobenzaprine (FLEXERIL) 10 MG tablet Take 1 tablet (10 mg total) by mouth at bedtime as needed for muscle spasms. 15 tablet 0  . naproxen (NAPROSYN) 500 MG tablet Take 1 tablet (500 mg total) by mouth 2 (two) times daily with a meal. 30 tablet 0   No current facility-administered medications for this visit.     Allergies  Allergen Reactions  . Cephalexin Nausea And Vomiting    Other Reaction: GI UPSET  .  Duloxetine Other (See Comments)    Headaches   . Aspirin Hives and Rash    Family History  Problem Relation Age of Onset  . CAD Mother        CABG  . Congenital heart disease Mother   . Hypertension Mother   . Hyperlipidemia Mother   . CAD Father        CABG  . Hypertension Father   . Hyperlipidemia Father   . Cancer Maternal Grandmother        colon  . Diabetes Neg Hx     Social History   Socioeconomic History  . Marital status: Married    Spouse name: Not on file  . Number of children: Not on file  . Years of education: Not on file  . Highest education level: Not on file  Occupational History  . Not on file  Social Needs  . Financial resource strain: Not on file  . Food insecurity:    Worry: Not on file    Inability: Not on file  .  Transportation needs:    Medical: Not on file    Non-medical: Not on file  Tobacco Use  . Smoking status: Never Smoker  . Smokeless tobacco: Never Used  Substance and Sexual Activity  . Alcohol use: Yes    Comment: 1 beer/day  . Drug use: No  . Sexual activity: Not on file  Lifestyle  . Physical activity:    Days per week: Not on file    Minutes per session: Not on file  . Stress: Not on file  Relationships  . Social connections:    Talks on phone: Not on file    Gets together: Not on file    Attends religious service: Not on file    Active member of club or organization: Not on file    Attends meetings of clubs or organizations: Not on file    Relationship status: Not on file  . Intimate partner violence:    Fear of current or ex partner: Not on file    Emotionally abused: Not on file    Physically abused: Not on file    Forced sexual activity: Not on file  Other Topics Concern  . Not on file  Social History Narrative   Lives with wife, 1 dog. Grown children   Edu: college   Occ: owns mattress store   Activity: active at work, started [employer], wants to restart running   Diet: good water, fruits/vegetables daily     Constitutional: Denies fever, malaise, fatigue, headache or abrupt weight changes.  Respiratory: Denies difficulty breathing, shortness of breath, cough or sputum production.   Cardiovascular: Denies chest pain, chest tightness, palpitations or swelling in the hands or feet.  Gastrointestinal: Denies abdominal pain, bloating, constipation, diarrhea or blood in the stool.  Musculoskeletal: Pt reports left sided chest wall pain. Denies decrease in range of motion, difficulty with gait, or joint pain and swelling.  Skin: Denies redness, rashes, lesions or ulcercations.    No other specific complaints in a complete review of systems (except as listed in HPI above).  Objective:   Physical Exam  BP 126/80   Pulse 60   Temp 97.9 F (36.6 C) (Oral)   Wt  169 lb (76.7 kg)   SpO2 98%   BMI 26.47 kg/m  Wt Readings from Last 3 Encounters:  01/25/18 169 lb (76.7 kg)  10/26/17 160 lb (72.6 kg)  06/09/17 164 lb (74.4 kg)    General:  Appears his stated age, well developed, well nourished in NAD. Skin: Warm, dry and intact. No rashes noted. Cardiovascular: Normal rate and rhythm. S1,S2 noted.  No murmur, rubs or gallops noted. No JVD or BLE edema.  Pulmonary/Chest: Normal effort and positive vesicular breath sounds. No respiratory distress. No wheezes, rales or ronchi noted.  Musculoskeletal: Pain with palpation of the left upper anterior chest wall. No deformity noted of the ribs. Normal internal and external rotation of the left shoulder. No pain with palpation of the clavicle/shoulder. Strength 5/5 BUE. Neurological: Alert and oriented.   BMET    Component Value Date/Time   NA 138 03/20/2017 1142   K 4.5 03/20/2017 1142   CL 102 03/20/2017 1142   CO2 31 03/20/2017 1142   GLUCOSE 98 03/20/2017 1142   BUN 24 (H) 03/20/2017 1142   CREATININE 1.38 03/20/2017 1142   CALCIUM 8.7 03/20/2017 1142   GFRNONAA >60 09/14/2016 0358   GFRAA >60 09/14/2016 0358    Lipid Panel     Component Value Date/Time   CHOL 228 (H) 03/20/2017 1142   TRIG 152.0 (H) 03/20/2017 1142   HDL 51.50 03/20/2017 1142   CHOLHDL 4 03/20/2017 1142   VLDL 30.4 03/20/2017 1142   LDLCALC 146 (H) 03/20/2017 1142    CBC    Component Value Date/Time   WBC 13.6 (H) 12/03/2016 1206   RBC 4.56 12/03/2016 1206   HGB 14.1 12/03/2016 1206   HCT 44.0 12/03/2016 1206   PLT 287.0 12/03/2016 1206   MCV 96.5 12/03/2016 1206   MCH 28.9 09/13/2016 0331   MCHC 32.0 12/03/2016 1206   RDW 17.7 (H) 12/03/2016 1206   LYMPHSABS 0.8 12/03/2016 1206   MONOABS 0.5 12/03/2016 1206   EOSABS 0.1 12/03/2016 1206   BASOSABS 0.1 12/03/2016 1206    Hgb A1C Lab Results  Component Value Date   HGBA1C 5.4 03/20/2017            Assessment & Plan:   Muscle Strain of Chest  Wall:  Encouraged heat and stretching eRx for Naproxen 500 mg BID with food- avoid other OTC antiinflammatories eRx for Flexeril 10 mg QHS prn- sedation caution given He declines xray of anterior ribs today  Return precautions discussed

## 2018-01-26 NOTE — Telephone Encounter (Signed)
Electronic refill request Hydroxyzine Last office visit 10/285/19 acute Last refill 11/05/17 #120/3

## 2018-01-28 ENCOUNTER — Other Ambulatory Visit: Payer: Self-pay | Admitting: Family Medicine

## 2018-01-29 DIAGNOSIS — I236 Thrombosis of atrium, auricular appendage, and ventricle as current complications following acute myocardial infarction: Secondary | ICD-10-CM

## 2018-01-29 HISTORY — DX: Thrombosis of atrium, auricular appendage, and ventricle as current complications following acute myocardial infarction: I23.6

## 2018-02-03 ENCOUNTER — Ambulatory Visit (AMBULATORY_SURGERY_CENTER): Payer: Self-pay | Admitting: *Deleted

## 2018-02-03 VITALS — Ht 67.0 in | Wt 171.8 lb

## 2018-02-03 DIAGNOSIS — K5000 Crohn's disease of small intestine without complications: Secondary | ICD-10-CM

## 2018-02-03 MED ORDER — NA SULFATE-K SULFATE-MG SULF 17.5-3.13-1.6 GM/177ML PO SOLN: 1.0000 | Freq: Once | ORAL | 0 refills | Status: AC

## 2018-02-03 NOTE — Progress Notes (Signed)
No egg or soy allergy known to patient  No issues with past sedation with any surgeries  or procedures, no intubation problems  No diet pills per patient No home 02 use per patient  No blood thinners per patient  Pt denies issues with constipation  No A fib or A flutter  EMMI video sent to pt's e mail - pt declined  Suprep $15 coupon and a PNM $50 coupon as pt states his wifes prep this morning was $80

## 2018-02-08 DIAGNOSIS — K5090 Crohn's disease, unspecified, without complications: Secondary | ICD-10-CM | POA: Diagnosis not present

## 2018-02-09 ENCOUNTER — Encounter: Payer: Self-pay | Admitting: Internal Medicine

## 2018-02-17 ENCOUNTER — Encounter: Payer: BLUE CROSS/BLUE SHIELD | Admitting: Internal Medicine

## 2018-02-20 ENCOUNTER — Encounter (HOSPITAL_COMMUNITY)
Admission: EM | Disposition: A | Discharge status: Home / Self Care | Payer: Self-pay | Source: Home / Self Care | Attending: Interventional Cardiology

## 2018-02-20 ENCOUNTER — Other Ambulatory Visit: Payer: Self-pay

## 2018-02-20 ENCOUNTER — Inpatient Hospital Stay (HOSPITAL_COMMUNITY)
Admission: EM | Admit: 2018-02-20 | Discharge: 2018-02-24 | DRG: 246 | Disposition: A | Discharge status: Home / Self Care | Payer: BLUE CROSS/BLUE SHIELD | Attending: Interventional Cardiology | Admitting: Interventional Cardiology

## 2018-02-20 ENCOUNTER — Encounter (HOSPITAL_COMMUNITY): Payer: Self-pay | Admitting: *Deleted

## 2018-02-20 ENCOUNTER — Emergency Department (HOSPITAL_COMMUNITY): Payer: BLUE CROSS/BLUE SHIELD

## 2018-02-20 ENCOUNTER — Inpatient Hospital Stay (HOSPITAL_COMMUNITY): Payer: BLUE CROSS/BLUE SHIELD

## 2018-02-20 DIAGNOSIS — M810 Age-related osteoporosis without current pathological fracture: Secondary | ICD-10-CM | POA: Diagnosis not present

## 2018-02-20 DIAGNOSIS — I110 Hypertensive heart disease with heart failure: Secondary | ICD-10-CM | POA: Diagnosis present

## 2018-02-20 DIAGNOSIS — F17290 Nicotine dependence, other tobacco product, uncomplicated: Secondary | ICD-10-CM | POA: Diagnosis not present

## 2018-02-20 DIAGNOSIS — E785 Hyperlipidemia, unspecified: Secondary | ICD-10-CM

## 2018-02-20 DIAGNOSIS — Z888 Allergy status to other drugs, medicaments and biological substances status: Secondary | ICD-10-CM

## 2018-02-20 DIAGNOSIS — I776 Arteritis, unspecified: Secondary | ICD-10-CM | POA: Diagnosis not present

## 2018-02-20 DIAGNOSIS — I236 Thrombosis of atrium, auricular appendage, and ventricle as current complications following acute myocardial infarction: Secondary | ICD-10-CM | POA: Diagnosis not present

## 2018-02-20 DIAGNOSIS — K5090 Crohn's disease, unspecified, without complications: Secondary | ICD-10-CM | POA: Diagnosis present

## 2018-02-20 DIAGNOSIS — Z79899 Other long term (current) drug therapy: Secondary | ICD-10-CM | POA: Diagnosis not present

## 2018-02-20 DIAGNOSIS — I10 Essential (primary) hypertension: Secondary | ICD-10-CM | POA: Diagnosis not present

## 2018-02-20 DIAGNOSIS — F419 Anxiety disorder, unspecified: Secondary | ICD-10-CM | POA: Diagnosis not present

## 2018-02-20 DIAGNOSIS — E782 Mixed hyperlipidemia: Secondary | ICD-10-CM | POA: Diagnosis not present

## 2018-02-20 DIAGNOSIS — Z7901 Long term (current) use of anticoagulants: Secondary | ICD-10-CM

## 2018-02-20 DIAGNOSIS — I241 Dressler's syndrome: Secondary | ICD-10-CM

## 2018-02-20 DIAGNOSIS — M301 Polyarteritis with lung involvement [Churg-Strauss]: Secondary | ICD-10-CM

## 2018-02-20 DIAGNOSIS — I219 Acute myocardial infarction, unspecified: Secondary | ICD-10-CM | POA: Diagnosis not present

## 2018-02-20 DIAGNOSIS — Z7983 Long term (current) use of bisphosphonates: Secondary | ICD-10-CM | POA: Diagnosis not present

## 2018-02-20 DIAGNOSIS — I5021 Acute systolic (congestive) heart failure: Secondary | ICD-10-CM | POA: Diagnosis not present

## 2018-02-20 DIAGNOSIS — I513 Intracardiac thrombosis, not elsewhere classified: Secondary | ICD-10-CM | POA: Diagnosis not present

## 2018-02-20 DIAGNOSIS — I2109 ST elevation (STEMI) myocardial infarction involving other coronary artery of anterior wall: Secondary | ICD-10-CM | POA: Diagnosis present

## 2018-02-20 DIAGNOSIS — I2510 Atherosclerotic heart disease of native coronary artery without angina pectoris: Secondary | ICD-10-CM | POA: Diagnosis not present

## 2018-02-20 DIAGNOSIS — N400 Enlarged prostate without lower urinary tract symptoms: Secondary | ICD-10-CM | POA: Diagnosis not present

## 2018-02-20 DIAGNOSIS — Z886 Allergy status to analgesic agent status: Secondary | ICD-10-CM | POA: Diagnosis not present

## 2018-02-20 DIAGNOSIS — R079 Chest pain, unspecified: Secondary | ICD-10-CM | POA: Diagnosis not present

## 2018-02-20 DIAGNOSIS — Z8719 Personal history of other diseases of the digestive system: Secondary | ICD-10-CM | POA: Diagnosis not present

## 2018-02-20 DIAGNOSIS — I213 ST elevation (STEMI) myocardial infarction of unspecified site: Secondary | ICD-10-CM | POA: Diagnosis not present

## 2018-02-20 DIAGNOSIS — D7218 Eosinophilia in diseases classified elsewhere: Secondary | ICD-10-CM

## 2018-02-20 DIAGNOSIS — I2102 ST elevation (STEMI) myocardial infarction involving left anterior descending coronary artery: Principal | ICD-10-CM

## 2018-02-20 DIAGNOSIS — I249 Acute ischemic heart disease, unspecified: Secondary | ICD-10-CM | POA: Diagnosis not present

## 2018-02-20 DIAGNOSIS — Z7952 Long term (current) use of systemic steroids: Secondary | ICD-10-CM | POA: Diagnosis not present

## 2018-02-20 DIAGNOSIS — R0602 Shortness of breath: Secondary | ICD-10-CM | POA: Diagnosis not present

## 2018-02-20 DIAGNOSIS — M79661 Pain in right lower leg: Secondary | ICD-10-CM | POA: Diagnosis not present

## 2018-02-20 DIAGNOSIS — Z8249 Family history of ischemic heart disease and other diseases of the circulatory system: Secondary | ICD-10-CM

## 2018-02-20 DIAGNOSIS — Z955 Presence of coronary angioplasty implant and graft: Secondary | ICD-10-CM

## 2018-02-20 DIAGNOSIS — R0789 Other chest pain: Secondary | ICD-10-CM | POA: Diagnosis not present

## 2018-02-20 HISTORY — PX: CORONARY/GRAFT ACUTE MI REVASCULARIZATION: CATH118305

## 2018-02-20 HISTORY — PX: LEFT HEART CATH AND CORONARY ANGIOGRAPHY: CATH118249

## 2018-02-20 LAB — COMPREHENSIVE METABOLIC PANEL
ALK PHOS: 56 U/L (ref 38–126)
ALT: 19 U/L (ref 0–44)
ANION GAP: 12 (ref 5–15)
AST: 21 U/L (ref 15–41)
Albumin: 3.5 g/dL (ref 3.5–5.0)
BUN: 23 mg/dL — ABNORMAL HIGH (ref 6–20)
CALCIUM: 8.1 mg/dL — AB (ref 8.9–10.3)
CHLORIDE: 101 mmol/L (ref 98–111)
CO2: 22 mmol/L (ref 22–32)
Creatinine, Ser: 1.21 mg/dL (ref 0.61–1.24)
Glucose, Bld: 147 mg/dL — ABNORMAL HIGH (ref 70–99)
Potassium: 2.9 mmol/L — ABNORMAL LOW (ref 3.5–5.1)
SODIUM: 135 mmol/L (ref 135–145)
Total Bilirubin: 0.7 mg/dL (ref 0.3–1.2)
Total Protein: 6.7 g/dL (ref 6.5–8.1)

## 2018-02-20 LAB — PROTIME-INR
INR: 1.02
PROTHROMBIN TIME: 13.3 s (ref 11.4–15.2)

## 2018-02-20 LAB — CBC WITH DIFFERENTIAL/PLATELET
ABS IMMATURE GRANULOCYTES: 0.17 10*3/uL — AB (ref 0.00–0.07)
Basophils Absolute: 0 10*3/uL (ref 0.0–0.1)
Basophils Relative: 0 %
EOS PCT: 1 %
Eosinophils Absolute: 0.2 10*3/uL (ref 0.0–0.5)
HCT: 44.4 % (ref 39.0–52.0)
Hemoglobin: 14.4 g/dL (ref 13.0–17.0)
IMMATURE GRANULOCYTES: 1 %
LYMPHS ABS: 2.6 10*3/uL (ref 0.7–4.0)
Lymphocytes Relative: 16 %
MCH: 29.4 pg (ref 26.0–34.0)
MCHC: 32.4 g/dL (ref 30.0–36.0)
MCV: 90.8 fL (ref 80.0–100.0)
MONO ABS: 2.2 10*3/uL — AB (ref 0.1–1.0)
MONOS PCT: 14 %
NRBC: 0 % (ref 0.0–0.2)
Neutro Abs: 10.8 10*3/uL — ABNORMAL HIGH (ref 1.7–7.7)
Neutrophils Relative %: 68 %
PLATELETS: 269 10*3/uL (ref 150–400)
RBC: 4.89 MIL/uL (ref 4.22–5.81)
RDW: 13.3 % (ref 11.5–15.5)
WBC: 16 10*3/uL — ABNORMAL HIGH (ref 4.0–10.5)

## 2018-02-20 LAB — POCT I-STAT, CHEM 8
BUN: 24 mg/dL — ABNORMAL HIGH (ref 6–20)
CREATININE: 0.8 mg/dL (ref 0.61–1.24)
Calcium, Ion: 1.08 mmol/L — ABNORMAL LOW (ref 1.15–1.40)
Chloride: 88 mmol/L — ABNORMAL LOW (ref 98–111)
GLUCOSE: 129 mg/dL — AB (ref 70–99)
HCT: 41 % (ref 39.0–52.0)
HEMOGLOBIN: 13.9 g/dL (ref 13.0–17.0)
POTASSIUM: 2.8 mmol/L — AB (ref 3.5–5.1)
Sodium: 126 mmol/L — ABNORMAL LOW (ref 135–145)
TCO2: 24 mmol/L (ref 22–32)

## 2018-02-20 LAB — LIPID PANEL
CHOLESTEROL: 226 mg/dL — AB (ref 0–200)
HDL: 47 mg/dL (ref >40)
LDL Cholesterol: 163 mg/dL — ABNORMAL HIGH (ref 0–99)
TRIGLYCERIDES: 78 mg/dL (ref <150)
Total CHOL/HDL Ratio: 4.8 RATIO
VLDL: 16 mg/dL (ref 0–40)

## 2018-02-20 LAB — APTT: APTT: 28 s (ref 24–36)

## 2018-02-20 LAB — POCT ACTIVATED CLOTTING TIME
ACTIVATED CLOTTING TIME: 268 s
ACTIVATED CLOTTING TIME: 411 s

## 2018-02-20 LAB — TROPONIN I: TROPONIN I: 0.07 ng/mL — AB (ref <0.03)

## 2018-02-20 SURGERY — CORONARY/GRAFT ACUTE MI REVASCULARIZATION
Anesthesia: LOCAL

## 2018-02-20 MED ORDER — TICAGRELOR 90 MG PO TABS
ORAL_TABLET | ORAL | Status: DC | PRN
  Administered 2018-02-20: 180 mg via ORAL

## 2018-02-20 MED ORDER — SODIUM CHLORIDE 0.9% FLUSH
3.0000 mL | Freq: Two times a day (BID) | INTRAVENOUS | Status: DC
  Administered 2018-02-20 – 2018-02-24 (×7): 3 mL via INTRAVENOUS

## 2018-02-20 MED ORDER — POTASSIUM CHLORIDE CRYS ER 20 MEQ PO TBCR
40.0000 meq | EXTENDED_RELEASE_TABLET | Freq: Two times a day (BID) | ORAL | Status: AC
  Administered 2018-02-20 – 2018-02-21 (×2): 40 meq via ORAL
  Filled 2018-02-20 (×2): qty 2

## 2018-02-20 MED ORDER — HEPARIN BOLUS VIA INFUSION
4000.0000 [IU] | Freq: Once | INTRAVENOUS | Status: AC
  Administered 2018-02-20: 4000 [IU] via INTRAVENOUS

## 2018-02-20 MED ORDER — CARVEDILOL 3.125 MG PO TABS
3.1250 mg | ORAL_TABLET | Freq: Two times a day (BID) | ORAL | Status: DC
  Administered 2018-02-21 – 2018-02-24 (×6): 3.125 mg via ORAL
  Filled 2018-02-20 (×7): qty 1

## 2018-02-20 MED ORDER — HEPARIN SODIUM (PORCINE) 1000 UNIT/ML IJ SOLN
INTRAMUSCULAR | Status: DC | PRN
  Administered 2018-02-20: 3000 [IU] via INTRAVENOUS
  Administered 2018-02-20: 6000 [IU] via INTRAVENOUS

## 2018-02-20 MED ORDER — NITROGLYCERIN 0.4 MG SL SUBL
0.4000 mg | SUBLINGUAL_TABLET | SUBLINGUAL | Status: DC | PRN
  Administered 2018-02-21: 0.4 mg via SUBLINGUAL
  Filled 2018-02-20: qty 1

## 2018-02-20 MED ORDER — OXYCODONE HCL 5 MG PO TABS
5.0000 mg | ORAL_TABLET | ORAL | Status: DC | PRN
  Administered 2018-02-20: 5 mg via ORAL
  Administered 2018-02-21 (×2): 10 mg via ORAL
  Administered 2018-02-21: 5 mg via ORAL
  Administered 2018-02-23: 10 mg via ORAL
  Administered 2018-02-23: 5 mg via ORAL
  Administered 2018-02-24 (×3): 10 mg via ORAL
  Filled 2018-02-20 (×2): qty 2
  Filled 2018-02-20: qty 1
  Filled 2018-02-20 (×2): qty 2
  Filled 2018-02-20: qty 1
  Filled 2018-02-20 (×2): qty 2
  Filled 2018-02-20: qty 1

## 2018-02-20 MED ORDER — HEPARIN SODIUM (PORCINE) 5000 UNIT/ML IJ SOLN
INTRAMUSCULAR | Status: AC
  Filled 2018-02-20: qty 1

## 2018-02-20 MED ORDER — MIDAZOLAM HCL 2 MG/2ML IJ SOLN
INTRAMUSCULAR | Status: AC
  Filled 2018-02-20: qty 2

## 2018-02-20 MED ORDER — LOSARTAN POTASSIUM 25 MG PO TABS
25.0000 mg | ORAL_TABLET | Freq: Every day | ORAL | Status: DC
  Administered 2018-02-21 – 2018-02-24 (×4): 25 mg via ORAL
  Filled 2018-02-20 (×4): qty 1

## 2018-02-20 MED ORDER — SODIUM CHLORIDE 0.9 % IV SOLN: INTRAVENOUS | Status: DC

## 2018-02-20 MED ORDER — FENTANYL CITRATE (PF) 100 MCG/2ML IJ SOLN
50.0000 ug | Freq: Once | INTRAMUSCULAR | Status: AC
  Administered 2018-02-20: 50 ug via INTRAVENOUS
  Filled 2018-02-20: qty 2

## 2018-02-20 MED ORDER — SODIUM CHLORIDE 0.9 % IV SOLN
INTRAVENOUS | Status: AC | PRN
  Administered 2018-02-20: 20 mL/h via INTRAVENOUS

## 2018-02-20 MED ORDER — LABETALOL HCL 5 MG/ML IV SOLN: 10.0000 mg | INTRAVENOUS | Status: AC | PRN

## 2018-02-20 MED ORDER — HEPARIN SODIUM (PORCINE) 5000 UNIT/ML IJ SOLN: 60.0000 [IU]/kg | Freq: Once | INTRAMUSCULAR | Status: DC

## 2018-02-20 MED ORDER — MIDAZOLAM HCL 2 MG/2ML IJ SOLN
INTRAMUSCULAR | Status: DC | PRN
  Administered 2018-02-20: 0.5 mg via INTRAVENOUS

## 2018-02-20 MED ORDER — HEPARIN (PORCINE) IN NACL 1000-0.9 UT/500ML-% IV SOLN
INTRAVENOUS | Status: DC | PRN
  Administered 2018-02-20 (×2): 500 mL

## 2018-02-20 MED ORDER — CANGRELOR BOLUS VIA INFUSION
INTRAVENOUS | Status: DC | PRN
  Administered 2018-02-20: 2178 ug via INTRAVENOUS

## 2018-02-20 MED ORDER — ASPIRIN 81 MG PO CHEW: 81.0000 mg | CHEWABLE_TABLET | Freq: Every day | ORAL | Status: DC

## 2018-02-20 MED ORDER — VERAPAMIL HCL 2.5 MG/ML IV SOLN
INTRAVENOUS | Status: DC | PRN
  Administered 2018-02-20: 10 mL via INTRA_ARTERIAL

## 2018-02-20 MED ORDER — SODIUM CHLORIDE 0.9 % IV SOLN: 250.0000 mL | INTRAVENOUS | Status: DC | PRN

## 2018-02-20 MED ORDER — HEPARIN (PORCINE) IN NACL 1000-0.9 UT/500ML-% IV SOLN
INTRAVENOUS | Status: AC
  Filled 2018-02-20: qty 1000

## 2018-02-20 MED ORDER — PREDNISONE 20 MG PO TABS
20.0000 mg | ORAL_TABLET | Freq: Every day | ORAL | Status: DC
  Administered 2018-02-22 – 2018-02-24 (×3): 20 mg via ORAL
  Filled 2018-02-20 (×3): qty 1

## 2018-02-20 MED ORDER — HEPARIN SODIUM (PORCINE) 5000 UNIT/ML IJ SOLN
5000.0000 [IU] | Freq: Three times a day (TID) | INTRAMUSCULAR | Status: DC
  Administered 2018-02-21: 5000 [IU] via SUBCUTANEOUS
  Filled 2018-02-20: qty 1

## 2018-02-20 MED ORDER — HEPARIN SODIUM (PORCINE) 1000 UNIT/ML IJ SOLN
INTRAMUSCULAR | Status: AC
  Filled 2018-02-20: qty 1

## 2018-02-20 MED ORDER — FENTANYL CITRATE (PF) 100 MCG/2ML IJ SOLN
INTRAMUSCULAR | Status: DC | PRN
  Administered 2018-02-20: 25 ug via INTRAVENOUS
  Administered 2018-02-20: 50 ug via INTRAVENOUS

## 2018-02-20 MED ORDER — TAMSULOSIN HCL 0.4 MG PO CAPS
0.4000 mg | ORAL_CAPSULE | Freq: Every day | ORAL | Status: DC
  Administered 2018-02-20 – 2018-02-23 (×4): 0.4 mg via ORAL
  Filled 2018-02-20 (×4): qty 1

## 2018-02-20 MED ORDER — ONDANSETRON HCL 4 MG/2ML IJ SOLN: 4.0000 mg | Freq: Four times a day (QID) | INTRAMUSCULAR | Status: DC | PRN

## 2018-02-20 MED ORDER — IOHEXOL 350 MG/ML SOLN
INTRAVENOUS | Status: DC | PRN
  Administered 2018-02-20: 150 mL via INTRAVENOUS

## 2018-02-20 MED ORDER — LIDOCAINE HCL (PF) 1 % IJ SOLN
INTRAMUSCULAR | Status: DC | PRN
  Administered 2018-02-20: 2 mL

## 2018-02-20 MED ORDER — ONDANSETRON HCL 4 MG/2ML IJ SOLN
4.0000 mg | Freq: Four times a day (QID) | INTRAMUSCULAR | Status: DC | PRN
  Administered 2018-02-20: 4 mg via INTRAVENOUS
  Filled 2018-02-20: qty 2

## 2018-02-20 MED ORDER — FENTANYL CITRATE (PF) 100 MCG/2ML IJ SOLN
INTRAMUSCULAR | Status: AC
  Filled 2018-02-20: qty 2

## 2018-02-20 MED ORDER — ATORVASTATIN CALCIUM 80 MG PO TABS
80.0000 mg | ORAL_TABLET | Freq: Every day | ORAL | Status: DC
  Administered 2018-02-20 – 2018-02-23 (×4): 80 mg via ORAL
  Filled 2018-02-20 (×4): qty 1

## 2018-02-20 MED ORDER — CANGRELOR TETRASODIUM 50 MG IV SOLR
INTRAVENOUS | Status: AC
  Filled 2018-02-20: qty 50

## 2018-02-20 MED ORDER — LIDOCAINE HCL (PF) 1 % IJ SOLN
INTRAMUSCULAR | Status: AC
  Filled 2018-02-20: qty 30

## 2018-02-20 MED ORDER — ADULT MULTIVITAMIN W/MINERALS CH
1.0000 | ORAL_TABLET | Freq: Every day | ORAL | Status: DC
  Administered 2018-02-21 – 2018-02-24 (×4): 1 via ORAL
  Filled 2018-02-20 (×4): qty 1

## 2018-02-20 MED ORDER — SODIUM CHLORIDE 0.9% FLUSH
3.0000 mL | INTRAVENOUS | Status: DC | PRN
  Administered 2018-02-22: 3 mL via INTRAVENOUS
  Filled 2018-02-20 (×2): qty 3

## 2018-02-20 MED ORDER — SODIUM CHLORIDE 0.9 % IV SOLN
INTRAVENOUS | Status: AC
  Administered 2018-02-20: via INTRAVENOUS

## 2018-02-20 MED ORDER — TICAGRELOR 90 MG PO TABS
90.0000 mg | ORAL_TABLET | Freq: Two times a day (BID) | ORAL | Status: DC
  Administered 2018-02-21 – 2018-02-22 (×3): 90 mg via ORAL
  Filled 2018-02-20 (×3): qty 1

## 2018-02-20 MED ORDER — VERAPAMIL HCL 2.5 MG/ML IV SOLN
INTRAVENOUS | Status: AC
  Filled 2018-02-20: qty 2

## 2018-02-20 MED ORDER — SODIUM CHLORIDE 0.9 % IV SOLN
INTRAVENOUS | Status: AC | PRN
  Administered 2018-02-20: 4 ug/kg/min via INTRAVENOUS

## 2018-02-20 MED ORDER — HYDRALAZINE HCL 20 MG/ML IJ SOLN: 5.0000 mg | INTRAMUSCULAR | Status: AC | PRN

## 2018-02-20 MED ORDER — ACETAMINOPHEN 325 MG PO TABS
650.0000 mg | ORAL_TABLET | ORAL | Status: DC | PRN
  Administered 2018-02-21 – 2018-02-24 (×6): 650 mg via ORAL
  Filled 2018-02-20 (×6): qty 2

## 2018-02-20 MED ORDER — TAMSULOSIN HCL 0.4 MG PO CAPS: 0.4000 mg | ORAL_CAPSULE | Freq: Every day | ORAL | Status: DC

## 2018-02-20 MED ORDER — ASPIRIN EC 81 MG PO TBEC
81.0000 mg | DELAYED_RELEASE_TABLET | Freq: Every day | ORAL | Status: DC
  Administered 2018-02-21 – 2018-02-24 (×4): 81 mg via ORAL
  Filled 2018-02-20 (×4): qty 1

## 2018-02-20 SURGICAL SUPPLY — 16 items
BALLN SAPPHIRE 2.5X12 (BALLOONS) ×2
BALLOON SAPPHIRE 2.5X12 (BALLOONS) ×1 IMPLANT
CATH INFINITI JR4 5F (CATHETERS) ×2 IMPLANT
CATH VISTA GUIDE 6FR XB3.5 (CATHETERS) ×2 IMPLANT
DEVICE RAD COMP TR BAND LRG (VASCULAR PRODUCTS) ×2 IMPLANT
GLIDESHEATH SLEND A-KIT 6F 22G (SHEATH) ×2 IMPLANT
GUIDEWIRE INQWIRE 1.5J.035X260 (WIRE) ×1 IMPLANT
INQWIRE 1.5J .035X260CM (WIRE) ×2
KIT ENCORE 26 ADVANTAGE (KITS) ×2 IMPLANT
KIT HEART LEFT (KITS) ×2 IMPLANT
PACK CARDIAC CATHETERIZATION (CUSTOM PROCEDURE TRAY) ×2 IMPLANT
SHEATH PROBE COVER 6X72 (BAG) ×2 IMPLANT
STENT SYNERGY DES 3X28 (Permanent Stent) ×2 IMPLANT
TRANSDUCER W/STOPCOCK (MISCELLANEOUS) ×2 IMPLANT
TUBING CIL FLEX 10 FLL-RA (TUBING) ×2 IMPLANT
WIRE ASAHI PROWATER 180CM (WIRE) ×2 IMPLANT

## 2018-02-20 NOTE — ED Notes (Signed)
Paged chaplain to make aware of family

## 2018-02-20 NOTE — Progress Notes (Signed)
Chaplain responded to call from nursing secretary at 8:51.  Chaplain located wife in waiting area and brought her to Consult B room. Wife's friend, a Furniture conservator/restorer was with her.  Nurse assisted in escorting wife and friend upstairs to see family as chaplain had other pt's as well.  Jack [name] [phone]

## 2018-02-20 NOTE — ED Triage Notes (Signed)
Pt from home c/o centralized CP that started during intercourse. Reports chest and throat tightness. Pt took Cialis 5 hours ago. 354m ASA given enroute by EMS

## 2018-02-20 NOTE — CV Procedure (Signed)
   Mr. Berrios has history of Crohn's disease, vasculitis, eosinophilic esophagitis, essential hypertension, and hyper eosinophilic syndrome who developed chest pain during intercourse after using Cialis approximately 5 hours earlier.  Onset of discomfort was around 7:40 PM.  Emergency coronary angiography via the right radial using vascular ultrasound assisted access.  Moderately severe circumflex and obtuse marginal disease, thrombotic occlusion of the proximal LAD, moderate mid RCA disease.  Successful recanalization of the LAD using a 28 x 3.0 mm Synergy drug-eluting stent reducing 100% stenosis with TIMI grade 0 flow to 0% with TIMI grade III flow.  Anterior wall akinesis starting in the proximal third and extending to the apex.  EF 30 to 35%.  LVEDP elevated.  No immediate complications.  Residual chest discomfort postprocedure likely related to embolic disease/microvascular obstruction.

## 2018-02-20 NOTE — ED Notes (Signed)
Cards at bedside

## 2018-02-20 NOTE — ED Provider Notes (Signed)
[facility] EMERGENCY DEPARTMENT Provider Note   CSN: [id number] Arrival date & time: 02/20/18  2044     History   Chief Complaint Chief Complaint  Patient presents with  . Code STEMI    HPI Jack [name] is a 52 y.o. male.  HPI  Patient presents immediately after developing chest pressure. Pressure is severe, currently 7/10, not changed with aspirin. No associated dyspnea, though there is generalized discomfort.  Pressure began while the patient was having intercourse. Prior to this he was in his usual state of health. Patient did use Cialis about 5 hours ago. Patient acknowledges being a cigarette smoker, starting 1 year ago, does not smoke cigarettes. Has a history of hypertension, no known history of CAD, but a family history of this.  Patient's case was designated as a code STEMI by EMS, and I reviewed the patient's EKG prior to arrival.     Past Medical History:  Diagnosis Date  . BPH (benign prostatic hypertrophy)   . Colitis   . Colon polyp    inflammatory  . Crohn disease ([place]) 1992   history uveitis, involvement of intestines and lungs  . Diverticulosis   . Eosinophilic esophagitis   . Essential hypertension   . GERD (gastroesophageal reflux disease)   . History of chicken pox   . History of gastroesophageal reflux (GERD)   . History of seizure 1995   grand mal x1, completed 6 yrs dilantin. no seizures since  . Internal hemorrhoids   . Osteoporosis 11/2015   DEXA T -2.9  . Schwannoma 2007   L axilla s/p surgery  . Seizures ([place])    last seizure 1995 and only x 1 seizure-   . Ulnar neuropathy    h/o this from L arm schwannoma    Patient Active Problem List   Diagnosis Date Noted  . Tinea cruris 06/09/2017  . Elevated PSA measurement 03/21/2017  . Vasculitis ([place]) 01/08/2017  . Defecation urgency 08/04/2016  . Healthcare maintenance 03/18/2016  . Eosinophilic esophagitis   . 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 Essential hypertension   . Benign prostatic hyperplasia   . Osteoporosis 11/30/2015  . Schwannoma 03/31/2005  . Crohn disease ([place]) 03/31/1990    Past Surgical History:  Procedure Laterality Date  . APPENDECTOMY  2000  . CARDIOVASCULAR STRESS TEST  01/2015   low risk study  . CHOLECYSTECTOMY  2000  . COLONOSCOPY  08/2014   f/u crohn's, 4 inflammatory polyps, diverticulosis, improved, rpt 2 yrs ([place])  . COLONOSCOPY  01/2015   during hospitalization - active ileitis/colitis  . ESOPHAGOGASTRODUODENOSCOPY  07/2014   h/o EE resolved, focal reflux esophagitis, chronic active gastritis  . extremity surgery Left    ulnar nerve repair after schwannoma removal  . INGUINAL HERNIA REPAIR Bilateral 2017  . LUMBAR SPINE SURGERY  2011  . POLYPECTOMY    . SPINAL CORD STIMULATOR IMPLANT  2012   x2 (Dr Berton Mount)  . TONSILLECTOMY  1996  . TUMOR REMOVAL Left 2007   schwannoma from L armpit  . UPPER GASTROINTESTINAL ENDOSCOPY          Home Medications    Prior to Admission medications   Medication Sig Start Date End Date Taking? Authorizing Provider  acetaminophen (TYLENOL) 325 MG tablet Take by mouth.    [provider]  alendronate (FOSAMAX) 70 MG tablet TAKE 1 TABLET (70 MG TOTAL) BY MOUTH EVERY SEVEN (7) DAYS. 01/11/18   [provider]  amLODipine (NORVASC) 5 MG  tablet TAKE 1 TABLET BY MOUTH EVERY DAY 01/29/18   [name], MD  azaTHIOprine (IMURAN) 50 MG tablet Take 150 mg by mouth. 03/04/17 03/04/18  [provider]  Calcium Carbonate-Vit D-Min (CALCIUM 1200 PO) Take 1 capsule by mouth 2 (two) times daily.    [provider]  cetirizine (ZYRTEC) 10 MG tablet Take by mouth. 07/27/17 07/27/18  [provider]  clotrimazole (LOTRIMIN AF) 1 % cream Apply 1 application topically 2 (two) times daily. Patient not taking: Reported on 02/03/2018 06/09/17   [name], MD  cyclobenzaprine (FLEXERIL) 10 MG tablet Take 1 tablet (10 mg  total) by mouth at bedtime as needed for muscle spasms. Patient not taking: Reported on 02/03/2018 01/25/18   Jearld Fenton, NP  dicyclomine (BENTYL) 10 MG capsule Take 1 capsule (10 mg total) by mouth every 6 (six) hours as needed for spasms (cramping). Patient not taking: Reported on 02/03/2018 09/14/16   Debbe Odea, MD  diphenoxylate-atropine (LOMOTIL) 2.5-0.025 MG tablet Take 1-2 tablets by mouth four times daily as needed 09/08/16   Pyrtle, Lajuan Lines, MD  gabapentin (NEURONTIN) 300 MG capsule Take 1 capsule (300 mg total) by mouth at bedtime as needed. 12/28/17   Lilia Argue R, DO  hydrOXYzine (ATARAX/VISTARIL) 25 MG tablet TAKE 4 TABLETS (100 MG TOTAL) BY MOUTH AT BEDTIME. 01/26/18   [name], MD  Multiple Vitamin (MULTIVITAMIN) capsule Take 1 capsule by mouth daily.    [provider]  naproxen (NAPROSYN) 500 MG tablet Take 1 tablet (500 mg total) by mouth 2 (two) times daily with a meal. Patient not taking: Reported on 02/03/2018 01/25/18   Jearld Fenton, NP  predniSONE (DELTASONE) 10 MG tablet Take 2 tablets (20 mg total) by mouth daily with breakfast. 09/07/17   [name], MD  tadalafil (CIALIS) 5 MG tablet Take 1 tablet (5 mg total) by mouth daily as needed for erectile dysfunction. 06/09/17   [name], MD  tamsulosin (FLOMAX) 0.4 MG CAPS capsule TAKE 1 CAPSULE (0.4 MG TOTAL) BY MOUTH DAILY. 04/29/17   [name], MD  Vedolizumab (ENTYVIO IV) Inject into the vein every 8 (eight) weeks.    [provider]    Family History Family History  Problem Relation Age of Onset  . CAD Mother        CABG  . Congenital heart disease Mother   . Hypertension Mother   . Hyperlipidemia Mother   . CAD Father        CABG  . Hypertension Father   . Hyperlipidemia Father   . Cancer Maternal Grandmother        colon  . Colon cancer Maternal Grandfather   . Diabetes Neg Hx   . Colon polyps Neg Hx   . Esophageal cancer Neg Hx   . Rectal cancer Neg  Hx   . Stomach cancer Neg Hx     Social History Social History   Tobacco Use  . Smoking status: Current Some Day Smoker    Types: Cigars  . Smokeless tobacco: Never Used  Substance Use Topics  . Alcohol use: Yes    Alcohol/week: 7.0 standard drinks    Types: 7 Cans of beer per week    Comment: 1 beer/day  . Drug use: No     Allergies   Cephalexin; Duloxetine; and Aspirin   Review of Systems Review of Systems  Constitutional:       Per HPI, otherwise negative  HENT:  Per HPI, otherwise negative  Respiratory:       Per HPI, otherwise negative  Cardiovascular:       Per HPI, otherwise negative  Gastrointestinal: Negative for vomiting.  Endocrine:       Negative aside from HPI  Genitourinary:       Neg aside from HPI   Musculoskeletal:       Per HPI, otherwise negative  Skin: Negative.   Neurological: Negative for syncope.     Physical Exam Updated Vital Signs Pulse 73   Temp (!) 97.5 F (36.4 C)   Resp 18   SpO2 99%   Physical Exam  Constitutional: He is oriented to person, place, and time. He appears well-developed. He appears ill.  HENT:  Head: Normocephalic and atraumatic.  Eyes: Conjunctivae and EOM are normal.  Cardiovascular: Normal rate and regular rhythm.  Pulmonary/Chest: Effort normal. No stridor. No respiratory distress.  Abdominal: He exhibits no distension.  Musculoskeletal: He exhibits no edema or deformity.  Neurological: He is alert and oriented to person, place, and time.  Skin: Skin is warm. He is diaphoretic.  Psychiatric: His mood appears anxious.  Nursing note and vitals reviewed.    ED Treatments / Results  Labs (all labs ordered are listed, but only abnormal results are displayed) Labs Reviewed  CBC WITH DIFFERENTIAL/PLATELET  PROTIME-INR  APTT  COMPREHENSIVE METABOLIC PANEL  TROPONIN I  LIPID PANEL    EKG EKG Interpretation  Date/Time:  Saturday February 20 2018 20:46:51 EST Ventricular Rate:  68 PR  Interval:    QRS Duration: 100 QT Interval:  399 QTC Calculation: 425 R Axis:   22 Text Interpretation:  Sinus rhythm Probable left atrial enlargement STEMI Abnormal ekg Confirmed by Carmin Muskrat [phone]) on 02/20/2018 8:49:19 PM   Radiology No results found.  Procedures Procedures (including critical care time)  Medications Ordered in ED Medications  0.9 %  sodium chloride infusion (has no administration in time range)  fentaNYL (SUBLIMAZE) injection 50 mcg (has no administration in time range)  heparin bolus via infusion 4,000 Units (has no administration in time range)     Initial Impression / Assessment and Plan / ED Course  I have reviewed the triage vital signs and the nursing notes.  Pertinent labs & imaging results that were available during my care of the patient were reviewed by me and considered in my medical decision making (see chart for details).    Immediately after the arrival, the patient was placed on continuous cardiac monitoring equipment, and we discussed his case was EMS personnel who transported him. Patient prepped to receive heparin, and we discussed this case with her cardiology colleagues, who soon thereafter joined [place] at bedside.  Initial EKG consistent with EKG transmitted via telemetry from ambulance, concerning for ST elevations in the anterior precordium, depressions in the inferior leads. With these, preparations were made to transfer the patient to the catheterization lab.   52 year old male presents after sudden onset chest pain that began during intercourse. Patient has history of hypertension, and given his diaphoresis, severe pain, and initial EKG there is concern for acute coronary syndrome. Patient started with heparin, fentanyl in the emergency department received aspirin by EMS providers, required conversation with our cardiology colleagues, and he was transferred to the catheterization lab for further evaluation and  management.   (LATE ADDENDUM) patient had emergent cath with relevant findings as below: Moderately severe circumflex and obtuse marginal disease, thrombotic occlusion of the proximal LAD, moderate mid RCA disease. Successful  recanalization of the LAD using a 28 x 3.0 mm Synergy drug-eluting stent reducing 100% stenosis with TIMI grade 0 flow to 0% with TIMI grade III flow. Final Clinical Impressions(s) / ED Diagnoses  Acute coronary syndrome  CRITICAL CARE Performed by: Carmin Muskrat Total critical care time: 35 minutes Critical care time was exclusive of separately billable procedures and treating other patients. Critical care was necessary to treat or prevent imminent or life-threatening deterioration. Critical care was time spent personally by me on the following activities: development of treatment plan with patient and/or surrogate as well as nursing, discussions with consultants, evaluation of patient's response to treatment, examination of patient, obtaining history from patient or surrogate, ordering and performing treatments and interventions, ordering and review of laboratory studies, ordering and review of radiographic studies, pulse oximetry and re-evaluation of patient's condition.    Carmin Muskrat, MD 02/20/18 2333

## 2018-02-20 NOTE — H&P (Addendum)
Cardiology Admission History and Physical:   Patient ID: FINNIS COLEE MRN: [id number]; DOB: Aug 08, 1965   Admission date: 02/20/2018  Primary Care Provider: [name], MD Primary Cardiologist: Meda Coffee Illene Labrador, III, MD Primary Electrophysiologist: None  Chief Complaint: Anterior ST elevation MI  Patient Profile:   Jack [name] is a 52 y.o. male with history of systemic vasculitis, eosinophilic esophagitis, Crohn's disease, and essential hypertension who was admitted with 1 hour of crushing chest pain which started 5 hours after receiving Cialis and while engaged in intercourse.  History of Present Illness:   Jack [name] patient has chronic medical problems as outlined above.  At approximately 7:40 PM he developed severe chest discomfort with nausea and diaphoresis.  The emergency medical services was summoned and transmitted EKG demonstrated evidence of anterior ST elevation infarction.  He was seen in the emergency room where care was coordinated with the emergency room staff, and he was given IV heparin and fentanyl for pain control.  He has no prior history of heart disease.  He has multiple other comorbidities including systemic vasculitis, history of low back/lumbar disc disease, Crohn's disease, eosinophilic esophagitis, and hyper eosinophilic syndrome.  He is on chronic prednisone therapy.  He smokes cigars.  He has a family history of coronary disease (mother and father).  Throughout the time of evaluation and management in the emergency room he complained of severe/crushing 8/10 central chest discomfort.   Past Medical History:  Diagnosis Date  . BPH (benign prostatic hypertrophy)   . Colitis   . Colon polyp    inflammatory  . Crohn disease ([place]) 1992   history uveitis, involvement of intestines and lungs  . Diverticulosis   . Eosinophilic esophagitis   . Essential hypertension   . GERD (gastroesophageal reflux disease)   . History of chicken pox   .  History of gastroesophageal reflux (GERD)   . History of seizure 1995   grand mal x1, completed 6 yrs dilantin. no seizures since  . Internal hemorrhoids   . Osteoporosis 11/2015   DEXA T -2.9  . Schwannoma 2007   L axilla s/p surgery  . Seizures ([place])    last seizure 1995 and only x 1 seizure-   . Ulnar neuropathy    h/o this from L arm schwannoma    Past Surgical History:  Procedure Laterality Date  . APPENDECTOMY  2000  . CARDIOVASCULAR STRESS TEST  01/2015   low risk study  . CHOLECYSTECTOMY  2000  . COLONOSCOPY  08/2014   f/u crohn's, 4 inflammatory polyps, diverticulosis, improved, rpt 2 yrs ([place])  . COLONOSCOPY  01/2015   during hospitalization - active ileitis/colitis  . ESOPHAGOGASTRODUODENOSCOPY  07/2014   h/o EE resolved, focal reflux esophagitis, chronic active gastritis  . extremity surgery Left    ulnar nerve repair after schwannoma removal  . INGUINAL HERNIA REPAIR Bilateral 2017  . LUMBAR SPINE SURGERY  2011  . POLYPECTOMY    . SPINAL CORD STIMULATOR IMPLANT  2012   x2 (Dr Berton Mount)  . TONSILLECTOMY  1996  . TUMOR REMOVAL Left 2007   schwannoma from L armpit  . UPPER GASTROINTESTINAL ENDOSCOPY       Medications Prior to Admission: Prior to Admission medications   Medication Sig Start Date End Date Taking? Authorizing Provider  acetaminophen (TYLENOL) 325 MG tablet Take by mouth.    [provider]  alendronate (FOSAMAX) 70 MG tablet TAKE 1 TABLET (70 MG TOTAL) BY MOUTH EVERY SEVEN (7) DAYS. 01/11/18  [provider]  amLODipine (NORVASC) 5 MG tablet TAKE 1 TABLET BY MOUTH EVERY DAY 01/29/18   [name], MD  azaTHIOprine (IMURAN) 50 MG tablet Take 150 mg by mouth. 03/04/17 03/04/18  [provider]  Calcium Carbonate-Vit D-Min (CALCIUM 1200 PO) Take 1 capsule by mouth 2 (two) times daily.    [provider]  cetirizine (ZYRTEC) 10 MG tablet Take by mouth. 07/27/17 07/27/18  [provider]  clotrimazole  (LOTRIMIN AF) 1 % cream Apply 1 application topically 2 (two) times daily. Patient not taking: Reported on 02/03/2018 06/09/17   [name], MD  cyclobenzaprine (FLEXERIL) 10 MG tablet Take 1 tablet (10 mg total) by mouth at bedtime as needed for muscle spasms. Patient not taking: Reported on 02/03/2018 01/25/18   Jearld Fenton, NP  dicyclomine (BENTYL) 10 MG capsule Take 1 capsule (10 mg total) by mouth every 6 (six) hours as needed for spasms (cramping). Patient not taking: Reported on 02/03/2018 09/14/16   Debbe Odea, MD  diphenoxylate-atropine (LOMOTIL) 2.5-0.025 MG tablet Take 1-2 tablets by mouth four times daily as needed 09/08/16   Pyrtle, Lajuan Lines, MD  gabapentin (NEURONTIN) 300 MG capsule Take 1 capsule (300 mg total) by mouth at bedtime as needed. 12/28/17   Lilia Argue R, DO  hydrOXYzine (ATARAX/VISTARIL) 25 MG tablet TAKE 4 TABLETS (100 MG TOTAL) BY MOUTH AT BEDTIME. 01/26/18   [name], MD  Multiple Vitamin (MULTIVITAMIN) capsule Take 1 capsule by mouth daily.    [provider]  predniSONE (DELTASONE) 10 MG tablet Take 2 tablets (20 mg total) by mouth daily with breakfast. 09/07/17   [name], MD  tamsulosin (FLOMAX) 0.4 MG CAPS capsule TAKE 1 CAPSULE (0.4 MG TOTAL) BY MOUTH DAILY. 04/29/17   [name], MD  Vedolizumab (ENTYVIO IV) Inject into the vein every 8 (eight) weeks.    [provider]     Allergies:    Allergies  Allergen Reactions  . Cephalexin Nausea And Vomiting    Other Reaction: GI UPSET  . Duloxetine Other (See Comments)    Headaches   . Aspirin Hives and Rash    Social History:   Social History   Socioeconomic History  . Marital status: Married    Spouse name: Not on file  . Number of children: Not on file  . Years of education: Not on file  . Highest education level: Not on file  Occupational History  . Not on file  Social Needs  . Financial resource strain: Not on file  . Food insecurity:     Worry: Not on file    Inability: Not on file  . Transportation needs:    Medical: Not on file    Non-medical: Not on file  Tobacco Use  . Smoking status: Current Some Day Smoker    Types: Cigars  . Smokeless tobacco: Never Used  Substance and Sexual Activity  . Alcohol use: Yes    Alcohol/week: 7.0 standard drinks    Types: 7 Cans of beer per week    Comment: 1 beer/day  . Drug use: No  . Sexual activity: Not on file  Lifestyle  . Physical activity:    Days per week: Not on file    Minutes per session: Not on file  . Stress: Not on file  Relationships  . Social connections:    Talks on phone: Not on file    Gets together: Not on file    Attends religious service: Not on file  Active member of club or organization: Not on file    Attends meetings of clubs or organizations: Not on file    Relationship status: Not on file  . Intimate partner violence:    Fear of current or ex partner: Not on file    Emotionally abused: Not on file    Physically abused: Not on file    Forced sexual activity: Not on file  Other Topics Concern  . Not on file  Social History Narrative   Lives with wife, 1 dog. Grown children   Edu: college   Occ: owns mattress store   Activity: active at work, started [employer], wants to restart running   Diet: good water, fruits/vegetables daily    Family History:   The patient's family history includes CAD in his father and mother; Cancer in his maternal grandmother; Colon cancer in his maternal grandfather; Congenital heart disease in his mother; Hyperlipidemia in his father and mother; Hypertension in his father and mother. There is no history of Diabetes, Colon polyps, Esophageal cancer, Rectal cancer, or Stomach cancer.    ROS:  Please see the history of present illness.  Chronic calf discomfort.  Recent severe pain in the right calf.  Never had bleeding associated with Crohn's disease.  Has had prior surgeries including appendectomy and  cholecystectomy.  All other ROS reviewed and negative.     Physical Exam/Data:   Vitals:   02/20/18 2050 02/20/18 2052  BP: (!) 156/92   Pulse: 73   Resp: 18   Temp: (!) 97.5 F (36.4 C)   SpO2: 99%   Weight:  72.6 kg  Height:  5' 7"  (1.702 m)   No intake or output data in the 24 hours ending 02/20/18 2254 Filed Weights   02/20/18 2052  Weight: 72.6 kg   Body mass index is 25.06 kg/m.  General:  Well nourished, well developed, and in acute distress with obvious pallor. HEENT: normal Lymph: no adenopathy Neck: no JVD Endocrine:  No thryomegaly Vascular: No carotid bruits; FA pulses 2+ bilaterally without bruits  Cardiac:  normal S1, S2; RRR; no murmur  Lungs:  clear to auscultation bilaterally, no wheezing, rhonchi or rales  Abd: soft, nontender, no hepatomegaly  Ext: no hair on lower extremities, small ulcer right anterior shin.  No edema Musculoskeletal:  No deformities, BUE and BLE strength normal and equal Skin: warm and dry  Neuro:  CNs 2-12 intact, no focal abnormalities noted Psych:  Normal affect    EKG:  The ECG that was done 2046 on 02/20/2018 was personally reviewed and demonstrates hyperacute T wave and ST elevation V4 V5 and V6.  Findings are consistent with acute injury in the setting of acute infarction.  Relevant CV Studies: No  Laboratory Data:  Chemistry Recent Labs  Lab 02/20/18 2052 02/20/18 2127  NA 135 126*  K 2.9* 2.8*  CL 101 88*  CO2 22  --   GLUCOSE 147* 129*  BUN 23* 24*  CREATININE 1.21 0.80  CALCIUM 8.1*  --   GFRNONAA >60  --   GFRAA >60  --   ANIONGAP 12  --     Recent Labs  Lab 02/20/18 2052  PROT 6.7  ALBUMIN 3.5  AST 21  ALT 19  ALKPHOS 56  BILITOT 0.7   Hematology Recent Labs  Lab 02/20/18 2052 02/20/18 2127  WBC 16.0*  --   RBC 4.89  --   HGB 14.4 13.9  HCT 44.4 41.0  MCV 90.8  --  MCH 29.4  --   MCHC 32.4  --   RDW 13.3  --   PLT 269  --    Cardiac Enzymes Recent Labs  Lab 02/20/18 2052    TROPONINI 0.07*   No results for input(s): TROPIPOC in the last 168 hours.  BNPNo results for input(s): BNP, PROBNP in the last 168 hours.  DDimer No results for input(s): DDIMER in the last 168 hours.  Radiology/Studies:  No results found.  Assessment and Plan:   1. Acute anterior ST elevation myocardial infarction 60 to 90 minutes in duration at the time of presentation in the emergency room where the patient was seen, evaluated, and care was coordinated in anticipation of emergency angiography and mechanical reperfusion.  Severe pain was treated with fentanyl.  IV heparin was administered. 2. Crohn's disease 3. History of systemic vasculitis, although patient cannot give much detail. 4. Hypertension, essential  RECOMMENDATIONS:   Emergency cath and mechanical reperfusion was recommended to the patient under emergency circumstances.  IV heparin and opiate therapy was used for pain.  Procedural risks including stroke, death, emergency surgery was discussed with the patient.  IV heparin was administered along with aspirin.  Hemodynamics were closely monitored.  Avoid NTG with Cialis on board x 6 hours prior to cath.  Severity of Illness: The appropriate patient status for this patient is INPATIENT. Inpatient status is judged to be reasonable and necessary in order to provide the required intensity of service to ensure the patient's safety. The patient's presenting symptoms, physical exam findings, and initial radiographic and laboratory data in the context of their chronic comorbidities is felt to place them at high risk for further clinical deterioration. Furthermore, it is not anticipated that the patient will be medically stable for discharge from the hospital within 2 midnights of admission. The following factors support the patient status of inpatient.   " The patient's presenting symptoms include ongoing severe chest discomfort. " The worrisome physical exam findings include  none. " The initial radiographic and laboratory data are worrisome because of ST elevation in anterior leads on EKG. " The chronic co-morbidities include vasculitis and Crohn's disease.  .   * I certify that at the point of admission it is my clinical judgment that the patient will require inpatient hospital care spanning beyond 2 midnights from the point of admission due to high intensity of service, high risk for further deterioration and high frequency of surveillance required.*    For questions or updates, please contact [place] Please consult [email or web address] for contact info under   CRITICAL CARE TIME 35 minutes    Signed, Jack Grooms, MD  02/20/2018 10:54 PM

## 2018-02-21 ENCOUNTER — Encounter (HOSPITAL_COMMUNITY)
Admission: EM | Disposition: A | Discharge status: Home / Self Care | Payer: Self-pay | Source: Home / Self Care | Attending: Interventional Cardiology

## 2018-02-21 HISTORY — PX: CORONARY ANGIOGRAPHY: CATH118303

## 2018-02-21 LAB — BASIC METABOLIC PANEL
ANION GAP: 8 (ref 5–15)
BUN: 17 mg/dL (ref 6–20)
CO2: 24 mmol/L (ref 22–32)
Calcium: 8.2 mg/dL — ABNORMAL LOW (ref 8.9–10.3)
Chloride: 102 mmol/L (ref 98–111)
Creatinine, Ser: 0.96 mg/dL (ref 0.61–1.24)
Glucose, Bld: 117 mg/dL — ABNORMAL HIGH (ref 70–99)
Potassium: 3.6 mmol/L (ref 3.5–5.1)
Sodium: 134 mmol/L — ABNORMAL LOW (ref 135–145)

## 2018-02-21 LAB — CBC
HEMATOCRIT: 42 % (ref 39.0–52.0)
HEMOGLOBIN: 13.1 g/dL (ref 13.0–17.0)
MCH: 28.4 pg (ref 26.0–34.0)
MCHC: 31.2 g/dL (ref 30.0–36.0)
MCV: 91.1 fL (ref 80.0–100.0)
Platelets: 256 10*3/uL (ref 150–400)
RBC: 4.61 MIL/uL (ref 4.22–5.81)
RDW: 13.4 % (ref 11.5–15.5)
WBC: 13.5 10*3/uL — AB (ref 4.0–10.5)
nRBC: 0 % (ref 0.0–0.2)

## 2018-02-21 LAB — LIPID PANEL
Cholesterol: 224 mg/dL — ABNORMAL HIGH (ref 0–200)
HDL: 50 mg/dL (ref >40)
LDL Cholesterol: 166 mg/dL — ABNORMAL HIGH (ref 0–99)
Total CHOL/HDL Ratio: 4.5 RATIO
Triglycerides: 40 mg/dL (ref <150)
VLDL: 8 mg/dL (ref 0–40)

## 2018-02-21 LAB — TSH: TSH: 1.467 u[IU]/mL (ref 0.350–4.500)

## 2018-02-21 LAB — HEMOGLOBIN A1C
Hgb A1c MFr Bld: 5.2 % (ref 4.8–5.6)
Mean Plasma Glucose: 102.54 mg/dL

## 2018-02-21 LAB — MAGNESIUM
MAGNESIUM: 2 mg/dL (ref 1.7–2.4)
Magnesium: 1.9 mg/dL (ref 1.7–2.4)

## 2018-02-21 LAB — MRSA PCR SCREENING: MRSA BY PCR: NEGATIVE

## 2018-02-21 LAB — TROPONIN I: Troponin I: >65 ng/mL (ref <0.03)

## 2018-02-21 LAB — HIV ANTIBODY (ROUTINE TESTING W REFLEX): HIV SCREEN 4TH GENERATION: NONREACTIVE

## 2018-02-21 LAB — BRAIN NATRIURETIC PEPTIDE: B Natriuretic Peptide: 14.9 pg/mL (ref 0.0–100.0)

## 2018-02-21 SURGERY — CORONARY ANGIOGRAPHY (CATH LAB)
Anesthesia: LOCAL

## 2018-02-21 MED ORDER — SODIUM CHLORIDE 0.9 % IV SOLN
INTRAVENOUS | Status: AC | PRN
  Administered 2018-02-21: 10 mL/h via INTRAVENOUS

## 2018-02-21 MED ORDER — MORPHINE SULFATE (PF) 2 MG/ML IV SOLN
2.0000 mg | Freq: Once | INTRAVENOUS | Status: AC
  Administered 2018-02-21: 2 mg via INTRAVENOUS
  Filled 2018-02-21: qty 1

## 2018-02-21 MED ORDER — SODIUM CHLORIDE 0.9% FLUSH
3.0000 mL | Freq: Two times a day (BID) | INTRAVENOUS | Status: DC
  Administered 2018-02-22 – 2018-02-24 (×4): 3 mL via INTRAVENOUS

## 2018-02-21 MED ORDER — LORAZEPAM 0.5 MG PO TABS
0.5000 mg | ORAL_TABLET | Freq: Four times a day (QID) | ORAL | Status: DC | PRN
  Administered 2018-02-21 – 2018-02-24 (×4): 0.5 mg via ORAL
  Filled 2018-02-21 (×4): qty 1

## 2018-02-21 MED ORDER — ONDANSETRON HCL 4 MG/2ML IJ SOLN: 4.0000 mg | Freq: Four times a day (QID) | INTRAMUSCULAR | Status: DC | PRN

## 2018-02-21 MED ORDER — HEPARIN (PORCINE) IN NACL 1000-0.9 UT/500ML-% IV SOLN
INTRAVENOUS | Status: DC | PRN
  Administered 2018-02-21 (×2): 500 mL

## 2018-02-21 MED ORDER — HEPARIN (PORCINE) IN NACL 1000-0.9 UT/500ML-% IV SOLN
INTRAVENOUS | Status: AC
  Filled 2018-02-21: qty 500

## 2018-02-21 MED ORDER — ALUM & MAG HYDROXIDE-SIMETH 200-200-20 MG/5ML PO SUSP
30.0000 mL | Freq: Four times a day (QID) | ORAL | Status: DC | PRN
  Filled 2018-02-21: qty 30

## 2018-02-21 MED ORDER — ACETAMINOPHEN 325 MG PO TABS: 650.0000 mg | ORAL_TABLET | ORAL | Status: DC | PRN

## 2018-02-21 MED ORDER — IOHEXOL 350 MG/ML SOLN
INTRAVENOUS | Status: DC | PRN
  Administered 2018-02-21: 65 mL via INTRA_ARTERIAL

## 2018-02-21 MED ORDER — HEPARIN SODIUM (PORCINE) 1000 UNIT/ML IJ SOLN
INTRAMUSCULAR | Status: DC | PRN
  Administered 2018-02-21: 4000 [IU] via INTRAVENOUS

## 2018-02-21 MED ORDER — VERAPAMIL HCL 2.5 MG/ML IV SOLN
INTRAVENOUS | Status: DC | PRN
  Administered 2018-02-21: 10 mL via INTRA_ARTERIAL

## 2018-02-21 MED ORDER — HYDROXYZINE HCL 25 MG PO TABS
100.0000 mg | ORAL_TABLET | Freq: Every day | ORAL | Status: DC
  Administered 2018-02-21 – 2018-02-23 (×3): 100 mg via ORAL
  Filled 2018-02-21 (×4): qty 4

## 2018-02-21 MED ORDER — SODIUM CHLORIDE 0.9% FLUSH: 3.0000 mL | INTRAVENOUS | Status: DC | PRN

## 2018-02-21 MED ORDER — VERAPAMIL HCL 2.5 MG/ML IV SOLN
INTRAVENOUS | Status: AC
  Filled 2018-02-21: qty 2

## 2018-02-21 MED ORDER — PREDNISONE 20 MG PO TABS
40.0000 mg | ORAL_TABLET | Freq: Once | ORAL | Status: AC
  Administered 2018-02-21: 40 mg via ORAL
  Filled 2018-02-21: qty 2

## 2018-02-21 MED ORDER — FENTANYL CITRATE (PF) 100 MCG/2ML IJ SOLN
INTRAMUSCULAR | Status: DC | PRN
  Administered 2018-02-21: 50 ug via INTRAVENOUS

## 2018-02-21 MED ORDER — HYDROMORPHONE HCL 1 MG/ML IJ SOLN
1.0000 mg | INTRAMUSCULAR | Status: DC | PRN
  Administered 2018-02-21 – 2018-02-24 (×6): 1 mg via INTRAVENOUS
  Filled 2018-02-21 (×6): qty 1

## 2018-02-21 MED ORDER — MIDAZOLAM HCL 2 MG/2ML IJ SOLN
INTRAMUSCULAR | Status: AC
  Filled 2018-02-21: qty 2

## 2018-02-21 MED ORDER — SODIUM CHLORIDE 0.9 % IV SOLN: 250.0000 mL | INTRAVENOUS | Status: DC | PRN

## 2018-02-21 MED ORDER — LIDOCAINE HCL (PF) 1 % IJ SOLN
INTRAMUSCULAR | Status: DC | PRN
  Administered 2018-02-21: 3 mL via INTRADERMAL

## 2018-02-21 MED ORDER — COLCHICINE 0.6 MG PO TABS
0.6000 mg | ORAL_TABLET | Freq: Two times a day (BID) | ORAL | Status: DC
  Administered 2018-02-21 – 2018-02-24 (×7): 0.6 mg via ORAL
  Filled 2018-02-21 (×7): qty 1

## 2018-02-21 MED ORDER — MIDAZOLAM HCL 2 MG/2ML IJ SOLN
INTRAMUSCULAR | Status: DC | PRN
  Administered 2018-02-21: 1 mg via INTRAVENOUS

## 2018-02-21 MED ORDER — SODIUM CHLORIDE 0.9 % WEIGHT BASED INFUSION
1.0000 mL/kg/h | INTRAVENOUS | Status: AC
  Administered 2018-02-21: 1 mL/kg/h via INTRAVENOUS

## 2018-02-21 MED ORDER — FENTANYL CITRATE (PF) 100 MCG/2ML IJ SOLN
INTRAMUSCULAR | Status: AC
  Filled 2018-02-21: qty 2

## 2018-02-21 MED ORDER — HEPARIN SODIUM (PORCINE) 5000 UNIT/ML IJ SOLN
5000.0000 [IU] | Freq: Three times a day (TID) | INTRAMUSCULAR | Status: DC
  Administered 2018-02-21 – 2018-02-22 (×2): 5000 [IU] via SUBCUTANEOUS
  Filled 2018-02-21 (×2): qty 1

## 2018-02-21 SURGICAL SUPPLY — 11 items
CATH INFINITI 5 FR JL3.5 (CATHETERS) ×2 IMPLANT
CATH VISTA GUIDE 6FR XB3.5 (CATHETERS) ×2 IMPLANT
DEVICE RAD COMP TR BAND LRG (VASCULAR PRODUCTS) ×2 IMPLANT
GLIDESHEATH SLEND A-KIT 6F 22G (SHEATH) ×2 IMPLANT
GUIDEWIRE INQWIRE 1.5J.035X260 (WIRE) ×1 IMPLANT
INQWIRE 1.5J .035X260CM (WIRE) ×2
KIT HEART LEFT (KITS) ×2 IMPLANT
PACK CARDIAC CATHETERIZATION (CUSTOM PROCEDURE TRAY) ×2 IMPLANT
SHEATH PROBE COVER 6X72 (BAG) ×2 IMPLANT
TRANSDUCER W/STOPCOCK (MISCELLANEOUS) ×2 IMPLANT
TUBING CIL FLEX 10 FLL-RA (TUBING) ×2 IMPLANT

## 2018-02-21 NOTE — Progress Notes (Signed)
Dr. Radford Pax at bedside

## 2018-02-21 NOTE — Progress Notes (Signed)
CTSP secondary to recurrent chest pain.  In talking with him, this pain is different in that it is sharp and worse with breathing and not like his acute MI pain.  No relief with  MSO4 78m.  EKG shows increased ST elevation in V1 and V2.  Exam with ? Rub vs. TR.  Discussed with Dr. STamala Julianwho reviewed EKG.  Seems a little early for acute pericarditis, although sx seem to go along with that and on PE there appears to be a ? Rub.  EKG very concerning with increased ST elevation in V1 and V2.  Cannot place on NTG gtt due to recent Cialis use.  Will take back to cath lab for relook now.  Cath lab has been notified.

## 2018-02-21 NOTE — Progress Notes (Signed)
Progress Note  Patient Name: Jack [name] Date of Encounter: 02/21/2018  Primary Cardiologist: Dr Tamala Julian  Subjective   Complains of pleuritic chest pain and L shoulder pain; no dyspnea  Inpatient Medications    Scheduled Meds: . aspirin EC  81 mg Oral Daily  . atorvastatin  80 mg Oral q1800  . carvedilol  3.125 mg Oral BID WC  . heparin  5,000 Units Subcutaneous Q8H  . losartan  25 mg Oral Daily  . multivitamin with minerals  1 tablet Oral Daily  . potassium chloride  40 mEq Oral BID  . predniSONE  20 mg Oral Q breakfast  . sodium chloride flush  3 mL Intravenous Q12H  . tamsulosin  0.4 mg Oral QHS  . ticagrelor  90 mg Oral BID   Continuous Infusions: . sodium chloride     PRN Meds: sodium chloride, acetaminophen, alum & mag hydroxide-simeth, ondansetron (ZOFRAN) IV, oxyCODONE, sodium chloride flush   Vital Signs    Vitals:   02/21/18 0726 02/21/18 0731 02/21/18 0736 02/21/18 0741  BP: (!) 144/94 (!) 140/93 131/86 134/87  Pulse: 80 73 72 76  Resp: 15 12 13 16   Temp:      TempSrc:      SpO2: 100% 100% 100% 100%  Weight:      Height:        Intake/Output Summary (Last 24 hours) at 02/21/2018 0810 Last data filed at 02/21/2018 0600 Gross per 24 hour  Intake 765.4 ml  Output 600 ml  Net 165.4 ml   Filed Weights   02/20/18 2052 02/20/18 2300 02/21/18 0500  Weight: 72.6 kg 77.9 kg 77.4 kg    Telemetry    Sinus with NSVT - Personally Reviewed   Physical Exam   GEN: No acute distress.   Neck: No JVD Cardiac: RRR, no murmurs, rubs, or gallops.  Respiratory: Clear to auscultation bilaterally. GI: Soft, nontender, non-distended  MS: No edema; radial cath site with no hematoma Neuro:  Nonfocal  Psych: Normal affect   Labs    Chemistry Recent Labs  Lab 02/20/18 2052 02/20/18 2127 02/21/18 0248  NA 135 126* 134*  K 2.9* 2.8* 3.6  CL 101 88* 102  CO2 22  --  24  GLUCOSE 147* 129* 117*  BUN 23* 24* 17  CREATININE 1.21 0.80 0.96    CALCIUM 8.1*  --  8.2*  PROT 6.7  --   --   ALBUMIN 3.5  --   --   AST 21  --   --   ALT 19  --   --   ALKPHOS 56  --   --   BILITOT 0.7  --   --   GFRNONAA >60  --  >60  GFRAA >60  --  >60  ANIONGAP 12  --  8     Hematology Recent Labs  Lab 02/20/18 2052 02/20/18 2127 02/21/18 0536  WBC 16.0*  --  13.5*  RBC 4.89  --  4.61  HGB 14.4 13.9 13.1  HCT 44.4 41.0 42.0  MCV 90.8  --  91.1  MCH 29.4  --  28.4  MCHC 32.4  --  31.2  RDW 13.3  --  13.4  PLT 269  --  256    Cardiac Enzymes Recent Labs  Lab 02/20/18 2052 02/20/18 2331 02/21/18 0536  TROPONINI 0.07* >65.00* >65.00*    BNP Recent Labs  Lab 02/20/18 2331  BNP 14.9     Radiology    Dg  Chest Port 1 View  Result Date: 02/21/2018 CLINICAL DATA:  Myocardial infarction. EXAM: PORTABLE CHEST 1 VIEW COMPARISON:  07/01/2016 FINDINGS: Upper normal heart size. Lungs appear clear. No pleural effusion. Dorsal column stimulator projects over the upper lumbar region. IMPRESSION: No active disease. Electronically Signed   By: Van Clines M.D.   On: 02/21/2018 00:14    Patient Profile     52 y.o. male with past medical history of vasculitis, eosinophilic esophagitis, Crohn's disease, hypertension admitted with anterior infarct.  Cardiac catheterization revealed 20% left main, occluded LAD, 90% ostial ramus which was small, 75% circumflex, 75% marginal and 75% mid RCA.  Ejection fraction 35%.  Patient had PCI of his LAD.  Due to recurrent chest pain patient underwent repeat cardiac catheterization which showed patent stent.  Assessment & Plan    1 status post anterior myocardial infarction-patient complains of pleuritic chest pain and left shoulder pain that has been persistent.  Relook catheterization revealed patent LAD.  Continue aspirin, Brilinta and statin.  Continue carvedilol and ARB.  Advance as tolerated by pulse and blood pressure.  Check echocardiogram for LV function.  2 residual coronary artery  disease-if patient has recurrent chest pain felt to be cardiac in etiology then likely best option would be coronary artery bypass and graft.  3 History of vasculitis-on chronic prednisone.  Will continue prednisone.  He was given additional prednisone in the Cath Lab.  Follow clinically.  May need short course of stress dose steroids.  4 hypertension-blood pressure controlled at present.  Follow and advance regimen as needed.   For questions or updates, please contact [place] Please consult [email or web address] for contact info under        Signed, Kirk Ruths, MD  02/21/2018, 8:10 AM

## 2018-02-21 NOTE — Progress Notes (Signed)
Paged Dr. Radford Pax regarding pt's complaint of left shoulder pain 8/10 and 16 beat run of Vtach. Received verbal orders to give 57m morphine and obtain STAT BMET and Magnesium.

## 2018-02-21 NOTE — Progress Notes (Signed)
Once pt arrived from cath lab, it was noted that pt right hand was purple, capillary refill >3 seconds, and cold. Pt reported right hand feeling numb. Deflated 2cc from TR band immediately. Deflated a total of 5cc within an hr, pt reported a decrease of numbness in hand. Skin color returned to normal and capillary refills <3 seconds.

## 2018-02-21 NOTE — CV Procedure (Signed)
   Worsening arm discomfort and associated chest pain, different in quality than on presentation.  EKG demonstrated more generalized ST elevation.  Given residual disease decision made to relook at the left coronary to rule out stent thrombosis or other undetected disease.  Right radial access via ultrasound guidance.  Anatomy is unchanged when compared to immediate post PCI imaging.  We do note distal LAD with myocardial bridge and systolic compression.  Continue current clinical course.  Add anti-inflammatory therapy in the form of stress steroid dosing times a single 40 mg dose today then back to 19 mg/day dose that he is taking previously.  We will also add colchicine.

## 2018-02-21 NOTE — Progress Notes (Signed)
Chaplain responded to [facility] in [place] at 6:30 AM. Had previously met wife and pt the night before in ED.  Pt is going to Cath lab for relook.  Pt requested prayer.  Provided ministry of presence. Will continue to follow. Tamsen Snider [phone] pager

## 2018-02-21 NOTE — Progress Notes (Signed)
Paged Dr. Radford Pax regarding pt's increased need for pain relief in the the upper left chest area, below collar bone despite giving multiple doses of pain medication. Pt stated this pain is different from the pain he came in with. Pt is unable to get comfortable. Obtained EKG. Vital signs stable.   Spoke with Dr. Radford Pax, MD will see pt shortly.

## 2018-02-22 ENCOUNTER — Inpatient Hospital Stay (HOSPITAL_COMMUNITY): Payer: BLUE CROSS/BLUE SHIELD

## 2018-02-22 ENCOUNTER — Telehealth: Payer: Self-pay | Admitting: Internal Medicine

## 2018-02-22 ENCOUNTER — Ambulatory Visit: Payer: BLUE CROSS/BLUE SHIELD | Admitting: Sports Medicine

## 2018-02-22 ENCOUNTER — Encounter (HOSPITAL_COMMUNITY): Payer: Self-pay | Admitting: Interventional Cardiology

## 2018-02-22 ENCOUNTER — Telehealth: Payer: Self-pay

## 2018-02-22 DIAGNOSIS — K5090 Crohn's disease, unspecified, without complications: Secondary | ICD-10-CM

## 2018-02-22 DIAGNOSIS — I10 Essential (primary) hypertension: Secondary | ICD-10-CM

## 2018-02-22 DIAGNOSIS — I776 Arteritis, unspecified: Secondary | ICD-10-CM

## 2018-02-22 DIAGNOSIS — I2102 ST elevation (STEMI) myocardial infarction involving left anterior descending coronary artery: Secondary | ICD-10-CM

## 2018-02-22 LAB — BASIC METABOLIC PANEL
ANION GAP: 7 (ref 5–15)
BUN: 13 mg/dL (ref 6–20)
CHLORIDE: 107 mmol/L (ref 98–111)
CO2: 21 mmol/L — ABNORMAL LOW (ref 22–32)
Calcium: 8.8 mg/dL — ABNORMAL LOW (ref 8.9–10.3)
Creatinine, Ser: 1.07 mg/dL (ref 0.61–1.24)
GFR calc non Af Amer: >60 mL/min (ref >60)
Glucose, Bld: 126 mg/dL — ABNORMAL HIGH (ref 70–99)
POTASSIUM: 5.1 mmol/L (ref 3.5–5.1)
SODIUM: 135 mmol/L (ref 135–145)

## 2018-02-22 LAB — ECHOCARDIOGRAM COMPLETE
Height: 67 in
Weight: 2649.05 oz

## 2018-02-22 LAB — CBC
HEMATOCRIT: 47.2 % (ref 39.0–52.0)
HEMOGLOBIN: 14.7 g/dL (ref 13.0–17.0)
MCH: 29 pg (ref 26.0–34.0)
MCHC: 31.1 g/dL (ref 30.0–36.0)
MCV: 93.1 fL (ref 80.0–100.0)
NRBC: 0 % (ref 0.0–0.2)
Platelets: 238 10*3/uL (ref 150–400)
RBC: 5.07 MIL/uL (ref 4.22–5.81)
RDW: 13.7 % (ref 11.5–15.5)
WBC: 11.8 10*3/uL — AB (ref 4.0–10.5)

## 2018-02-22 LAB — HEPARIN LEVEL (UNFRACTIONATED): Heparin Unfractionated: 0.34 IU/mL (ref 0.30–0.70)

## 2018-02-22 MED ORDER — PERFLUTREN LIPID MICROSPHERE
4.0000 mL | Freq: Once | INTRAVENOUS | Status: AC
  Administered 2018-02-22: 4 mL via INTRAVENOUS

## 2018-02-22 MED ORDER — HEPARIN (PORCINE) 25000 UT/250ML-% IV SOLN
1150.0000 [IU]/h | INTRAVENOUS | Status: DC
  Administered 2018-02-22 – 2018-02-23 (×2): 1100 [IU]/h via INTRAVENOUS
  Filled 2018-02-22 (×3): qty 250

## 2018-02-22 MED ORDER — CLOPIDOGREL BISULFATE 300 MG PO TABS
600.0000 mg | ORAL_TABLET | Freq: Once | ORAL | Status: AC
  Administered 2018-02-22: 600 mg via ORAL
  Filled 2018-02-22: qty 2

## 2018-02-22 MED ORDER — WARFARIN SODIUM 10 MG PO TABS
10.0000 mg | ORAL_TABLET | Freq: Once | ORAL | Status: AC
  Administered 2018-02-22: 10 mg via ORAL
  Filled 2018-02-22: qty 1

## 2018-02-22 MED ORDER — PREDNISONE 10 MG PO TABS
10.0000 mg | ORAL_TABLET | Freq: Every day | ORAL | Status: AC
  Administered 2018-02-22: 10 mg via ORAL
  Filled 2018-02-22: qty 1

## 2018-02-22 MED ORDER — CLOPIDOGREL BISULFATE 75 MG PO TABS
75.0000 mg | ORAL_TABLET | Freq: Every day | ORAL | Status: DC
  Administered 2018-02-23: 75 mg via ORAL
  Filled 2018-02-22: qty 1

## 2018-02-22 MED ORDER — WARFARIN - PHARMACIST DOSING INPATIENT
Freq: Every day | Status: DC
  Administered 2018-02-22 – 2018-02-23 (×2)

## 2018-02-22 NOTE — Progress Notes (Signed)
Patient complained of not being able to completely empty bladder. Bladder scanned patient, showed 230 ml in bladder. Patient stated issue started two weeks ago but worsened upon this admission. However, pt has voided 600 ml since 1900 and pt said he has relief after he urinates. Pt denies lower abdominal discomfort or pressure. Will continue to monitor.

## 2018-02-22 NOTE — Progress Notes (Addendum)
Progress Note  Patient Name: Jack [name] Date of Encounter: 02/22/2018  Primary Cardiologist: No primary care provider on file.   Subjective   Jack [name] was seen resting in his bed with his wife at side. He stated that he did not have any chest pain or dyspnea at this time. He mentioned that the chest discomfort resolved yesterday. He has been able to walk around in the hallway without difficulty.   Inpatient Medications    Scheduled Meds: . aspirin EC  81 mg Oral Daily  . atorvastatin  80 mg Oral q1800  . carvedilol  3.125 mg Oral BID WC  . colchicine  0.6 mg Oral BID  . heparin  5,000 Units Subcutaneous Q8H  . hydrOXYzine  100 mg Oral QHS  . losartan  25 mg Oral Daily  . multivitamin with minerals  1 tablet Oral Daily  . predniSONE  20 mg Oral Q breakfast  . sodium chloride flush  3 mL Intravenous Q12H  . sodium chloride flush  3 mL Intravenous Q12H  . tamsulosin  0.4 mg Oral QHS  . ticagrelor  90 mg Oral BID   Continuous Infusions: . sodium chloride Stopped (02/21/18 1940)  . sodium chloride Stopped (02/21/18 1940)   PRN Meds: sodium chloride, sodium chloride, acetaminophen, alum & mag hydroxide-simeth, HYDROmorphone (DILAUDID) injection, LORazepam, ondansetron (ZOFRAN) IV, oxyCODONE, sodium chloride flush, sodium chloride flush   Vital Signs    Vitals:   02/22/18 0500 02/22/18 0600 02/22/18 0610 02/22/18 0700  BP: 92/65  107/80 96/68  Pulse: 65 74 66 68  Resp: _0 Temp:      TempSrc:      SpO2: 100% 100% 90% 100%  Weight:  75.1 kg    Height:        Intake/Output Summary (Last 24 hours) at 02/22/2018 0741 Last data filed at 02/22/2018 0030 Gross per 24 hour  Intake -  Output 1150 ml  Net -1150 ml   Filed Weights   02/20/18 2300 02/21/18 0500 02/22/18 0600  Weight: 77.9 kg 77.4 kg 75.1 kg    Telemetry    Runs of non sustained vtach, st elevation in II - Personally Reviewed  ECG    Normal sinus rhythm with new ST elevations in  2 3 aVF, V2, V3- Personally Reviewed  Physical Exam   Physical Exam  Constitutional: Appears well-developed and well-nourished. No distress.  HENT:  Head: Normocephalic and atraumatic.  Eyes: Conjunctivae are normal.  Cardiovascular: Normal rate, regular rhythm and normal heart sounds.  Respiratory: Effort normal and breath sounds normal. No respiratory distress. No wheezes.  GI: Soft. Bowel sounds are normal. No distension. There is no tenderness.  Musculoskeletal: No edema.  Neurological: Is alert.  Skin: Not diaphoretic. No erythema.  Psychiatric: Normal mood and affect. Behavior is normal. Judgment and thought content normal.    Labs    Chemistry Recent Labs  Lab 02/20/18 2052 02/20/18 2127 02/21/18 0248 02/22/18 0340  NA 135 126* 134* 135  K 2.9* 2.8* 3.6 5.1  CL 101 88* 102 107  CO2 22  --  24 21*  GLUCOSE 147* 129* 117* 126*  BUN 23* 24* 17 13  CREATININE 1.21 0.80 0.96 1.07  CALCIUM 8.1*  --  8.2* 8.8*  PROT 6.7  --   --   --   ALBUMIN 3.5  --   --   --   AST 21  --   --   --   ALT  19  --   --   --   ALKPHOS 56  --   --   --   BILITOT 0.7  --   --   --   GFRNONAA >60  --  >60 >60  GFRAA >60  --  >60 >60  ANIONGAP 12  --  8 7     Hematology Recent Labs  Lab 02/20/18 2052 02/20/18 2127 02/21/18 0536 02/22/18 0340  WBC 16.0*  --  13.5* 11.8*  RBC 4.89  --  4.61 5.07  HGB 14.4 13.9 13.1 14.7  HCT 44.4 41.0 42.0 47.2  MCV 90.8  --  91.1 93.1  MCH 29.4  --  28.4 29.0  MCHC 32.4  --  31.2 31.1  RDW 13.3  --  13.4 13.7  PLT 269  --  256 238    Cardiac Enzymes Recent Labs  Lab 02/20/18 2331 02/21/18 0536 02/21/18 1132 02/21/18 1829  TROPONINI >65.00* >65.00* >65.00* >65.00*   No results for input(s): TROPIPOC in the last 168 hours.   BNP Recent Labs  Lab 02/20/18 2331  BNP 14.9     DDimer No results for input(s): DDIMER in the last 168 hours.   Radiology    Dg Chest Port 1 View  Result Date: 02/21/2018 CLINICAL DATA:  Myocardial  infarction. EXAM: PORTABLE CHEST 1 VIEW COMPARISON:  07/01/2016 FINDINGS: Upper normal heart size. Lungs appear clear. No pleural effusion. Dorsal column stimulator projects over the upper lumbar region. IMPRESSION: No active disease. Electronically Signed   By: Van Clines M.D.   On: 02/21/2018 00:14    Cardiac Studies   Left heart cath 02/20/2018  Anterior ST elevation myocardial infarction due to proximal occlusion of the LAD.  Successful PCI and stent reducing 100% stenosis with 0 TIMI flow to 0% with TIMI grade III flow using a 28 x 3.0 mm Synergy drug-eluting stent.  Postdilatation was not performed.  Deployment pressure 12 atm.  Mild distal left main disease, 20%  95% ostial proximal small to moderate size ramus intermedius.  Moderate to moderately severe circumflex disease with ostial 70 to 80% stenosis, dominant obtuse marginal with ostial 70% stenosis, and proximal 50 to 60% circumflex.  RCA is dominant with eccentric 70% stenosis.  Anterior and anteroapical akinesis with EF 35%.  LVEDP (24 mmHg)  is elevated and consistent with acute systolic heart failure.  RECOMMENDATIONS:   Guideline directed therapy for acute ischemic systolic heart failure: Beta-blocker, ARB therapy.  Diuretic therapy as needed if congestion.  Aggressive secondary risk factor modification using high intensity statin therapy and encouraging the patient to discontinue smoking.  Target blood pressure 130/80 mmHg or less.  Closely review the patient's medical regimen for Crohn's / systemic vasculitis/hypereosinophilic syndrome and be certain to use all important medications.  We will continue prednisone at 20 mg/day but watch for evidence of hypo-adrenal ism and potentially consider a dose or 2 of stress level glucocorticoid therapy if needed.  Cangrelor will be discontinued 2 hours after the Brilinta slurry was swallowed.  Moderate to moderately severe residual coronary disease in the circumflex,  ramus, and RCA will be managed with medical therapy.  Recommend uninterrupted dual antiplatelet therapy with Aspirin 66m daily and Ticagrelor 973mtwice daily for a minimum of .   Coronary angiography 02/21/2018  Anatomy is stable when compared to immediate post PCI results.  There is TIMI grade III flow in the LAD and circumflex.  The proximal LAD stent is widely patent and as before there  is proximal plaque obstructing the vessel up to 40%.  RECOMMENDATIONS:   We will give 40 mg of prednisone orally today which is twice his typical dose.  Start colchicine 0.6 mg twice daily.  2D Doppler echocardiogram is already been ordered.  We will give a stronger analgesic and also provide benzodiazepine therapy as the patient is very concerned that he cannot sleep.  If the patient develops progression in the disease proximal to the stented segment, coronary artery bypass grafting would be his best treatment option.   Recommend uninterrupted dual antiplatelet therapy with Aspirin 72m daily and Ticagrelor 928mtwice daily for a minimum of 12 months (ACS - Class I recommendation).  Patient Profile     [age].o. male with Crohn's disease, eosinophilic esophagitis, vasculitis on chronic prednisone, essential hypertension who presented on 02/20/2018 with 5-hour history of chest pain, nausea, diaphoresis after taking cialis and engaging in intercourse.  Initial EKG showed hyperacute T waves and ST elevation V4-V6. Patient was unable to be placed on NTG due to recent cialis use. Patient taken for emergency cath which showed 100% stenosis OST LAD to proximal LAD, 90% stenosis OST ramus, 70% stenosis no ST CX to proximal CX.  Stent was placed in OST LAD to proximal LAD after angioplasty was performed.  Assessment & Plan    Anterior STEMI Left heart cath done 11/23 post stent placement in OSD LAD to proximal LAD. Cangrelor was used during procedure. He was having continued chest pain on 11/24 and  repeat EKG showed ST elevations in V1 and V2 with questionable rub and tricuspid regurg patient underwent repeat cath which showed stable stent in proximal LAD that was widely patent.  There is concern for possible pericarditis and therefore patient was started on colchicine 0.6 mg twice daily.  Due to residual coronary artery disease patient may benefit from CABG in the future.  Patient's blood pressure has been ranging 90-110/60-80s over the past 24 hours and rates ranging 70-80s.  -Atorvastatin 80 mg daily -Aspirin and Brilinta for 12 months -Echocardiogram pending  -Continue carvedilol 3.125 mg twice daily -Continue losartan 25 mg daily -Continue colchicine 0.6 mg twice daily -Continue heparin due to lv thrombus noted on echo, will bridge to warfarin -Will decide later date to do rca pci  Post MI Pericarditis EKG today shows normal sinus rhythm with new ST elevations in 2 3 aVF, V2, V3.  Troponin has been continually elevated at >65. 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 He was given an extra one time dose of prednisone 4022mesterday.  -Continue colchicine 0.6 mg twice daily -Continue aspirin -Continue Ativan 0.5 mg every 6 hours as needed -Continue hydroxyzine 100 mg nightly  History of vasculitis Patient takes prednisone 20 mg daily with breakfast at home.  Patient was given an additional 40 mg of prednisone yesterday 11/24.  Essential hypertension Patient's blood pressure has been ranging 90-110/60-80s over the past 24 hours and rates ranging 70-80s.  -Continue carvedilol 3.125 mg twice daily -Continue losartan 25 mg daily  Crohns disease Patient is supposed to be taking azathioprine and taper down prednisone per GI and Rheum recommendations, but the patient has not been able to tolerate azathioprine and therefore has not taken it in the past few months.   -Continue prednisone 22m49m  For questions or updates, please contact [facility] consult [email or web address]  for contact info under        Signed, VahiLars Mage  02/22/2018, 7:41 AM    I have examined  the patient and reviewed assessment and plan and discussed with patient.  Agree with above as stated.  Prednisone 30 mg today.  Will hope to give 20 mg daily tomorrow.  Continue colchicine.    COntinue CHF meds. LV thrombus note on echo.  Start IV heparin.  Will have to decide on timing of RCA intervention and then start warfarin for 3 months.   Will likely need a life vest.  Will initiate the process as LVEF on V-gram was severely decreased.     Larae Grooms

## 2018-02-22 NOTE — Progress Notes (Signed)
  Echocardiogram 2D Echocardiogram has been performed.  Madelaine Etienne 02/22/2018, 11:27 AM

## 2018-02-22 NOTE — Telephone Encounter (Signed)
Jerene Bears, MD at 02/22/2018 10:05 AM   Status: Signed    Mickel Baas,  I am so sorry to hear of Jack [name]'s heart attack, but I am glad that he made it to the hospital and they were able to place a stent.  The azathioprine was used with the Entyvio therapy for his Crohn's disease.  Not sure if his [facility] rheumatologist stopped this medication or not.    If the prescriptions are not being used, we can talk about this when he follows up.  We will certainly need to delay any GI procedures until he has fully recovered from his heart attack.  Please let him know I am thinking about him and wished him well.  I will get him an appointment for January to follow-up with me.  If he has any issues with his IBD sooner than this, please let me know

## 2018-02-22 NOTE — Care Management (Signed)
Benefits check sent and pending for Brilinta. CM team will continue to follow to discuss monthly est copay and provide Brilinta card.  Midge Minium RN, BSN, NCM-BC, ACM-RN [phone]

## 2018-02-22 NOTE — Progress Notes (Signed)
CARDIAC REHAB PHASE I   PRE:  Rate/Rhythm: 82 SR    BP: sitting 140/78    SaO2: 100 RA  MODE:  Ambulation: 370 ft   POST:  Rate/Rhythm: 88 SR    BP: sitting 127/84     SaO2: 100 RA  Ambulating without CP. Did admit to fatigue toward end of walk. Ed discussed with pt and wife and daughter. Good reception although pt somewhat flat. Wife asked many questions. Discussed Brilinta. Gave videos to watch regarding Lifevest and MI, stent, and Coumadin (if eventually needs). Pt deciding on what CRPII he wants. [place], [place] 02/22/2018 3:08 PM

## 2018-02-22 NOTE — Care Management (Addendum)
#    4.   S/W MEGAN  @ [facility] # [phone]   TICAGRELOR: NONE FORMULARY   BRILINTA 90 MG BID COVER- YES CO-PAY- $ 80.00  NONE FORMULARY TIER-  3 DRUG PRIOR APPROVAL- NO  NO DEDUCTIBLE  PREFERRED PHARMACY : YES [facility]

## 2018-02-22 NOTE — Progress Notes (Signed)
ANTICOAGULATION CONSULT NOTE - Initial Consult  Pharmacy Consult for IV heparin Indication: clot in apex  Allergies  Allergen Reactions  . Cephalexin Nausea And Vomiting    Other Reaction: GI UPSET  . Duloxetine Other (See Comments)    Headaches   . Aspirin Hives and Rash    Patient Measurements: Height: 5' 7"  (170.2 cm) Weight: 165 lb 9.1 oz (75.1 kg) IBW/kg (Calculated) : 66.1 Heparin Dosing Weight: 75 kg  Vital Signs: Temp: 97.9 F (36.6 C) (11/25 0825) Temp Source: Oral (11/25 0825) BP: 103/74 (11/25 0900) Pulse Rate: 68 (11/25 0900)  Labs: Recent Labs    02/20/18 2052 02/20/18 2127  02/21/18 0248 02/21/18 0536 02/21/18 1132 02/21/18 1829 02/22/18 0340  HGB 14.4 13.9  --   --  13.1  --   --  14.7  HCT 44.4 41.0  --   --  42.0  --   --  47.2  PLT 269  --   --   --  256  --   --  238  APTT 28  --   --   --   --   --   --   --   LABPROT 13.3  --   --   --   --   --   --   --   INR 1.02  --   --   --   --   --   --   --   CREATININE 1.21 0.80  --  0.96  --   --   --  1.07  TROPONINI 0.07*  --    < >  --  >65.00* >65.00* >65.00*  --    < > = values in this interval not displayed.    Estimated Creatinine Clearance: 77.2 mL/min (by C-G formula based on SCr of 1.07 mg/dL).   Medical History: Past Medical History:  Diagnosis Date  . BPH (benign prostatic hypertrophy)   . Colitis   . Colon polyp    inflammatory  . Crohn disease ([place]) 1992   history uveitis, involvement of intestines and lungs  . Diverticulosis   . Eosinophilic esophagitis   . Essential hypertension   . GERD (gastroesophageal reflux disease)   . History of chicken pox   . History of gastroesophageal reflux (GERD)   . History of seizure 1995   grand mal x1, completed 6 yrs dilantin. no seizures since  . Internal hemorrhoids   . Osteoporosis 11/2015   DEXA T -2.9  . Schwannoma 2007   L axilla s/p surgery  . Seizures ([place])    last seizure 1995 and only x 1 seizure-   . Ulnar  neuropathy    h/o this from L arm schwannoma    Medications:  Infusions:  . sodium chloride Stopped (02/21/18 1940)  . sodium chloride Stopped (02/21/18 1940)  . heparin      Assessment: 52 yo male admitted with chest pain s/p PCI.  Likely with post-MI percarditis, ECHO revealed clot in apex.  Pharmacy asked to begin anticoagulation with IV heparin.  CBC WNL.  Received 5000 units SQ heparin at ~ 6 AM today.  Goal of Therapy:  Heparin level 0.3-0.7 units/ml Monitor platelets by anticoagulation protocol: Yes   Plan:  1. Start IV heparin at 1100 units/hr (no bolus) 2. Check heparin level in 6 hrs 3. Daily heparin level and CBC 4. F/u plans for long-term anticoagulation.  Marguerite Olea, [facility] Clinical Pharmacist Phone [phone]  02/22/2018  11:13 AM

## 2018-02-22 NOTE — Progress Notes (Addendum)
[place] for IV heparin/Coumadin Indication: clot in apex  Allergies  Allergen Reactions  . Cephalexin Nausea And Vomiting    Other Reaction: GI UPSET  . Duloxetine Other (See Comments)    Headaches   . Aspirin Hives and Rash    Patient Measurements: Height: 5' 7"  (170.2 cm) Weight: 165 lb 9.1 oz (75.1 kg) IBW/kg (Calculated) : 66.1 Heparin Dosing Weight: 75 kg  Vital Signs: Temp: 98.6 F (37 C) (11/25 1521) Temp Source: Oral (11/25 1521) BP: 109/81 (11/25 1500) Pulse Rate: 78 (11/25 1500)  Labs: Recent Labs    02/20/18 2052 02/20/18 2127  02/21/18 0248 02/21/18 0536 02/21/18 1132 02/21/18 1829 02/22/18 0340  HGB 14.4 13.9  --   --  13.1  --   --  14.7  HCT 44.4 41.0  --   --  42.0  --   --  47.2  PLT 269  --   --   --  256  --   --  238  APTT 28  --   --   --   --   --   --   --   LABPROT 13.3  --   --   --   --   --   --   --   INR 1.02  --   --   --   --   --   --   --   CREATININE 1.21 0.80  --  0.96  --   --   --  1.07  TROPONINI 0.07*  --    < >  --  >65.00* >65.00* >65.00*  --    < > = values in this interval not displayed.    Estimated Creatinine Clearance: 77.2 mL/min (by C-G formula based on SCr of 1.07 mg/dL).   Medical History: Past Medical History:  Diagnosis Date  . BPH (benign prostatic hypertrophy)   . Colitis   . Colon polyp    inflammatory  . Crohn disease ([place]) 1992   history uveitis, involvement of intestines and lungs  . Diverticulosis   . Eosinophilic esophagitis   . Essential hypertension   . GERD (gastroesophageal reflux disease)   . History of chicken pox   . History of gastroesophageal reflux (GERD)   . History of seizure 1995   grand mal x1, completed 6 yrs dilantin. no seizures since  . Internal hemorrhoids   . Osteoporosis 11/2015   DEXA T -2.9  . Schwannoma 2007   L axilla s/p surgery  . Seizures ([place])    last seizure 1995 and only x 1 seizure-   . Ulnar neuropathy    h/o  this from L arm schwannoma    Medications:  Infusions:  . sodium chloride Stopped (02/21/18 1940)  . sodium chloride Stopped (02/21/18 1940)  . heparin 1,100 Units/hr (02/22/18 1500)    Assessment: 52 yo male admitted with chest pain s/p PCI.  Likely with post-MI percarditis, ECHO revealed clot in apex.  Pt known to pharmacy from heparin dosing earlier today. Pharmacy asked to begin coumadin also. Baseline INR 1.02. Today will be Day #1/5 day minimum IV/po anticoagulation overlap.  Goal of Therapy:  Heparin level 0.3-0.7 units/ml Monitor platelets by anticoagulation protocol: Yes   Plan:  1. Continue IV heparin at 1100 units/hr 2. Check heparin level 6 hrs post start 3. Daily heparin level, CBC, and INR 4. Coumadin 57m tonight  CSherlon Handing PharmD, BCPS Clinical pharmacist  **Pharmacist phone  directory can now be found on [email or web address] ([email or web address]).  Listed under [place].  02/22/2018 3:22 PM   Addendum (3016): Heparin level 0.34 (low end of therapeutic) on gtt at 1100 units/hr. No bleeding noted.  Plan: Continue heparin at 1100 units/hr Will f/u 6 hr heparin level to confirm therapeutic   Sherlon Handing, PharmD, BCPS Clinical pharmacist 02/22/2018 5:55 PM

## 2018-02-22 NOTE — Telephone Encounter (Signed)
Wife wanted to make sure we had received the pt advice request she sent. Let her know we did and it has  been sent to Dr. [name].

## 2018-02-22 NOTE — Progress Notes (Signed)
EKG CRITICAL VALUE     12 lead EKG performed.  Critical value noted.  Tiffany, RN notified.   Genia Plants, CCT 02/22/2018 7:09 AM

## 2018-02-22 NOTE — Progress Notes (Signed)
Morning EKG done , shows critical test results possible STEMI. Pt asymptomatic. Paged [place], [place].

## 2018-02-22 NOTE — Progress Notes (Signed)
Spoke to [place], [place]. No new orders received at this time. Will notify if pt becomes symptomatic.

## 2018-02-23 ENCOUNTER — Other Ambulatory Visit: Payer: Self-pay

## 2018-02-23 ENCOUNTER — Encounter: Payer: BLUE CROSS/BLUE SHIELD | Admitting: Internal Medicine

## 2018-02-23 ENCOUNTER — Telehealth: Payer: Self-pay | Admitting: Internal Medicine

## 2018-02-23 DIAGNOSIS — I236 Thrombosis of atrium, auricular appendage, and ventricle as current complications following acute myocardial infarction: Secondary | ICD-10-CM

## 2018-02-23 DIAGNOSIS — I5021 Acute systolic (congestive) heart failure: Secondary | ICD-10-CM

## 2018-02-23 LAB — HEPARIN LEVEL (UNFRACTIONATED)
Heparin Unfractionated: 0.46 IU/mL (ref 0.30–0.70)
Heparin Unfractionated: 0.51 IU/mL (ref 0.30–0.70)

## 2018-02-23 LAB — CBC
HCT: 41 % (ref 39.0–52.0)
HEMOGLOBIN: 12.9 g/dL — AB (ref 13.0–17.0)
MCH: 28.7 pg (ref 26.0–34.0)
MCHC: 31.5 g/dL (ref 30.0–36.0)
MCV: 91.3 fL (ref 80.0–100.0)
NRBC: 0 % (ref 0.0–0.2)
Platelets: 228 10*3/uL (ref 150–400)
RBC: 4.49 MIL/uL (ref 4.22–5.81)
RDW: 13.6 % (ref 11.5–15.5)
WBC: 14.1 10*3/uL — ABNORMAL HIGH (ref 4.0–10.5)

## 2018-02-23 LAB — PROTIME-INR
INR: 1.11
Prothrombin Time: 14.2 seconds (ref 11.4–15.2)

## 2018-02-23 MED ORDER — PATIENT'S GUIDE TO USING COUMADIN BOOK
Freq: Once | Status: AC
  Administered 2018-02-23: 09:00:00
  Filled 2018-02-23: qty 1

## 2018-02-23 MED ORDER — WARFARIN VIDEO
Freq: Once | Status: AC
  Administered 2018-02-23: 1

## 2018-02-23 MED ORDER — WARFARIN SODIUM 7.5 MG PO TABS
7.5000 mg | ORAL_TABLET | Freq: Once | ORAL | Status: AC
  Administered 2018-02-23: 7.5 mg via ORAL
  Filled 2018-02-23: qty 1

## 2018-02-23 NOTE — Progress Notes (Signed)
Patient arrived to the unit. He is alert and oriented. Patient has been placed on telemetry, CCMD notified with 2nd verification.. Patient is on a Heparin drip 11units.  Patient Rt. Hand, Wrist and Forearm bruised  Level 1. Bruised area is soft. Skin is clean, dry and intact. Patient is able to ambulate.  Patient VS B/P113/78, HR 66, RR 14,  Anderson Malta was 2nd Therapist, sports skin verification.

## 2018-02-23 NOTE — Telephone Encounter (Signed)
Jerene Bears, MD   02/22/18 10:05 AM  Note    Mickel Baas,  I am so sorry to hear of Jeff's heart attack, but I am glad that he made it to the hospital and they were able to place a stent.  The azathioprine was used with the Entyvio therapy for his Crohn's disease.  Not sure if his [facility] rheumatologist stopped this medication or not.    If the prescriptions are not being used, we can talk about this when he follows up.  We will certainly need to delay any GI procedures until he has fully recovered from his heart attack.  Please let him know I am thinking about him and wished him well.  I will get him an appointment for January to follow-up with me.  If he has any issues with his IBD sooner than this, please let me know              02/22/18 12:33 AM  Hi Dr [name] [name] is scheduled for a colonoscopy Tuesday morning and we will have to reschedule. He had a widow maker heart attack Saturday night. That artery was 100% blocked. Dr. Daneen Schick was able to place a stent successfully. [name] also had 2 other blockages at 70%. He has moderately severe cardiovascular artery disease. He is at [facility]. He had to get a 2nd cath this morning due to a bad EKG (worse than the admission one) and pain. The stint is fine and the blood flow is good. He does have pericarditis and "lots of inflammation" per Dr. Tamala Julian. He is on 19 mg of Prednisone daily (normally). They gave him some prednisone (? on amount) in the cath lab, 20 mg. Orally this morning and then 40 more later (one time dose). He is scheduled to only get 20 mg tomorrow so I am concerned that 20 isn't enough since that is his daily regimen normally.    I also have questions about his Azathioprine. When I came home today to look at his meds, I found 2 full bottles filled in July and October. I can't imagine that Dr. Nicola Girt would discontinue it. I don't know exactly what that is, but he is apparently not taking it.     I did try to call the office to leave a message to cancel it but that wasn't an option.   My cell is [phone].   Thank you,  Adrean Heitz

## 2018-02-23 NOTE — Progress Notes (Signed)
[place] for IV heparin/Coumadin Indication: LV apical thrombus  Allergies  Allergen Reactions  . Cephalexin Nausea And Vomiting    Other Reaction: GI UPSET  . Duloxetine Other (See Comments)    Headaches   . Aspirin Hives and Rash    Patient Measurements: Height: 5' 7"  (170.2 cm) Weight: 167 lb 15.9 oz (76.2 kg) IBW/kg (Calculated) : 66.1 Heparin Dosing Weight: 75 kg  Vital Signs: Temp: 98.2 F (36.8 C) (11/26 0400) Temp Source: Oral (11/26 0400) BP: 88/60 (11/26 0600) Pulse Rate: 58 (11/26 0700)  Labs: Recent Labs    02/20/18 2052 02/20/18 2127  02/21/18 0248 02/21/18 0536 02/21/18 1132 02/21/18 1829 02/22/18 0340 02/22/18 1706 02/23/18 0007 02/23/18 0550  HGB 14.4 13.9  --   --  13.1  --   --  14.7  --   --  12.9*  HCT 44.4 41.0  --   --  42.0  --   --  47.2  --   --  41.0  PLT 269  --   --   --  256  --   --  238  --   --  228  APTT 28  --   --   --   --   --   --   --   --   --   --   LABPROT 13.3  --   --   --   --   --   --   --   --   --  14.2  INR 1.02  --   --   --   --   --   --   --   --   --  1.11  HEPARINUNFRC  --   --   --   --   --   --   --   --  0.34 0.51 0.46  CREATININE 1.21 0.80  --  0.96  --   --   --  1.07  --   --   --   TROPONINI 0.07*  --    < >  --  >65.00* >65.00* >65.00*  --   --   --   --    < > = values in this interval not displayed.    Estimated Creatinine Clearance: 77.2 mL/min (by C-G formula based on SCr of 1.07 mg/dL).   Medical History: Past Medical History:  Diagnosis Date  . BPH (benign prostatic hypertrophy)   . Colitis   . Colon polyp    inflammatory  . Crohn disease ([place]) 1992   history uveitis, involvement of intestines and lungs  . Diverticulosis   . Eosinophilic esophagitis   . Essential hypertension   . GERD (gastroesophageal reflux disease)   . History of chicken pox   . History of gastroesophageal reflux (GERD)   . History of seizure 1995   grand mal x1,  completed 6 yrs dilantin. no seizures since  . Internal hemorrhoids   . Osteoporosis 11/2015   DEXA T -2.9  . Schwannoma 2007   L axilla s/p surgery  . Seizures ([place])    last seizure 1995 and only x 1 seizure-   . Ulnar neuropathy    h/o this from L arm schwannoma    Medications:  Infusions:  . sodium chloride Stopped (02/21/18 1940)  . sodium chloride Stopped (02/21/18 1940)  . heparin 1,100 Units/hr (02/23/18 0700)    Assessment: 52 yo male admitted with chest  pain s/p PCI.  Likely with post-MI percarditis, ECHO revealed clot in apex.  Heparin level at goal this AM, INR just starting to rise.  No overt bleeding or complications noted.  Today will be Day #2/5 day minimum IV/po anticoagulation overlap.  Goal of Therapy:  Heparin level 0.3-0.7 units/ml Monitor platelets by anticoagulation protocol: Yes   Plan:  1. Continue IV heparin at 1100 units/hr 2. Coumadin 7.5 mg x 1 tonight. 3. Daily heparin level, CBC, and INR   Marguerite Olea, [facility] Clinical Pharmacist Phone [phone]  02/23/2018 7:52 AM

## 2018-02-23 NOTE — Progress Notes (Signed)
[place] for IV heparin Indication: clot in apex  Allergies  Allergen Reactions  . Cephalexin Nausea And Vomiting    Other Reaction: GI UPSET  . Duloxetine Other (See Comments)    Headaches   . Aspirin Hives and Rash    Patient Measurements: Height: 5' 7"  (170.2 cm) Weight: 165 lb 9.1 oz (75.1 kg) IBW/kg (Calculated) : 66.1 Heparin Dosing Weight: 75 kg  Vital Signs: Temp: 98.7 F (37.1 C) (11/25 2004) Temp Source: Oral (11/25 2004) BP: 93/54 (11/26 0000) Pulse Rate: 70 (11/26 0000)  Labs: Recent Labs    02/20/18 2052 02/20/18 2127  02/21/18 0248 02/21/18 0536 02/21/18 1132 02/21/18 1829 02/22/18 0340 02/22/18 1706 02/23/18 0007  HGB 14.4 13.9  --   --  13.1  --   --  14.7  --   --   HCT 44.4 41.0  --   --  42.0  --   --  47.2  --   --   PLT 269  --   --   --  256  --   --  238  --   --   APTT 28  --   --   --   --   --   --   --   --   --   LABPROT 13.3  --   --   --   --   --   --   --   --   --   INR 1.02  --   --   --   --   --   --   --   --   --   HEPARINUNFRC  --   --   --   --   --   --   --   --  0.34 0.51  CREATININE 1.21 0.80  --  0.96  --   --   --  1.07  --   --   TROPONINI 0.07*  --    < >  --  >65.00* >65.00* >65.00*  --   --   --    < > = values in this interval not displayed.    Estimated Creatinine Clearance: 77.2 mL/min (by C-G formula based on SCr of 1.07 mg/dL).   Medical History: Past Medical History:  Diagnosis Date  . BPH (benign prostatic hypertrophy)   . Colitis   . Colon polyp    inflammatory  . Crohn disease ([place]) 1992   history uveitis, involvement of intestines and lungs  . Diverticulosis   . Eosinophilic esophagitis   . Essential hypertension   . GERD (gastroesophageal reflux disease)   . History of chicken pox   . History of gastroesophageal reflux (GERD)   . History of seizure 1995   grand mal x1, completed 6 yrs dilantin. no seizures since  . Internal hemorrhoids   .  Osteoporosis 11/2015   DEXA T -2.9  . Schwannoma 2007   L axilla s/p surgery  . Seizures ([place])    last seizure 1995 and only x 1 seizure-   . Ulnar neuropathy    h/o this from L arm schwannoma    Medications:  Infusions:  . sodium chloride Stopped (02/21/18 1940)  . sodium chloride Stopped (02/21/18 1940)  . heparin 1,100 Units/hr (02/23/18 0100)    Assessment: 52 yo male admitted with chest pain s/p PCI.  Likely with post-MI percarditis, ECHO revealed clot in apex.  Pharmacy asked to  begin anticoagulation with IV heparin.  CBC WNL.  Received 5000 units SQ heparin at ~ 6 AM today.  11/26 AM update: heparin level therapeutic x 2  Goal of Therapy:  Heparin level 0.3-0.7 units/ml Monitor platelets by anticoagulation protocol: Yes   Plan:  Cont heparin at 1100 units/hr Daily CBC/HL Monitor for bleeding  Narda Bonds, PharmD, [place] Pharmacist Phone: [phone]

## 2018-02-23 NOTE — Progress Notes (Signed)
CARDIAC REHAB PHASE I   PRE:  Rate/Rhythm: 67 SR  BP:  Supine: 116/75  Sitting:   Standing:    SaO2: 96%RA  MODE:  Ambulation: 740 ft   POST:  Rate/Rhythm: 78 SR  BP:  Supine:   Sitting: 102/79  Standing:    SaO2: 96%RA 1105-1132 Pt walked 740 ft on RA with steady gait and no CP. Tolerated well. Will send CRP 2 referral to [place] as pt will decide on program at later time.   Graylon Good, RN BSN  02/23/2018 11:29 AM

## 2018-02-23 NOTE — Telephone Encounter (Signed)
Noted, pt sent mychart message to call and schedule OV for then they return.

## 2018-02-23 NOTE — Progress Notes (Addendum)
Progress Note  Patient Name: Jack [name] Date of Encounter: 02/23/2018  Primary Cardiologist: No primary care provider on file. Dr. Daneen Schick  Subjective   The patient was seen resting in his bed. He mentioned that he has been very fatigued since yesterday. He denies any chest pain, pleurisy, or dyspnea.   Inpatient Medications    Scheduled Meds: . aspirin EC  81 mg Oral Daily  . atorvastatin  80 mg Oral q1800  . carvedilol  3.125 mg Oral BID WC  . clopidogrel  75 mg Oral Daily  . colchicine  0.6 mg Oral BID  . hydrOXYzine  100 mg Oral QHS  . losartan  25 mg Oral Daily  . multivitamin with minerals  1 tablet Oral Daily  . predniSONE  20 mg Oral Q breakfast  . sodium chloride flush  3 mL Intravenous Q12H  . sodium chloride flush  3 mL Intravenous Q12H  . tamsulosin  0.4 mg Oral QHS  . Warfarin - Pharmacist Dosing Inpatient   Does not apply q1800   Continuous Infusions: . sodium chloride Stopped (02/21/18 1940)  . sodium chloride Stopped (02/21/18 1940)  . heparin 1,100 Units/hr (02/23/18 0600)   PRN Meds: sodium chloride, sodium chloride, acetaminophen, alum & mag hydroxide-simeth, HYDROmorphone (DILAUDID) injection, LORazepam, ondansetron (ZOFRAN) IV, oxyCODONE, sodium chloride flush, sodium chloride flush   Vital Signs    Vitals:   02/23/18 0200 02/23/18 0400 02/23/18 0500 02/23/18 0600  BP: 93/68 96/66  (!) 88/60  Pulse: 68 61  61  Resp: 18 13  14   Temp:  98.2 F (36.8 C)    TempSrc:  Oral    SpO2: 96% 96%  98%  Weight:   76.2 kg   Height:        Intake/Output Summary (Last 24 hours) at 02/23/2018 0718 Last data filed at 02/23/2018 0600 Gross per 24 hour  Intake 915.83 ml  Output -  Net 915.83 ml   Filed Weights   02/21/18 0500 02/22/18 0600 02/23/18 0500  Weight: 77.4 kg 75.1 kg 76.2 kg    Telemetry    NSR- Personally Reviewed  ECG    02/22/18 shows diffuse st elevation in all leads- Personally Reviewed  Physical Exam   Physical  Exam  Constitutional: Appears well-developed and well-nourished. No distress.  HENT:  Head: Normocephalic and atraumatic.  Eyes: Conjunctivae are normal.  Cardiovascular: Normal rate, regular rhythm and normal heart sounds.  Respiratory: Effort normal and breath sounds normal. No respiratory distress. No wheezes.  GI: Soft. Bowel sounds are normal. No distension. There is no tenderness.  Musculoskeletal: No edema.  Neurological: Is alert.  Skin: Not diaphoretic. No erythema.  Psychiatric: Normal mood and affect. Behavior is normal. Judgment and thought content normal.    Labs    Chemistry Recent Labs  Lab 02/20/18 2052 02/20/18 2127 02/21/18 0248 02/22/18 0340  NA 135 126* 134* 135  K 2.9* 2.8* 3.6 5.1  CL 101 88* 102 107  CO2 22  --  24 21*  GLUCOSE 147* 129* 117* 126*  BUN 23* 24* 17 13  CREATININE 1.21 0.80 0.96 1.07  CALCIUM 8.1*  --  8.2* 8.8*  PROT 6.7  --   --   --   ALBUMIN 3.5  --   --   --   AST 21  --   --   --   ALT 19  --   --   --   ALKPHOS 56  --   --   --  BILITOT 0.7  --   --   --   GFRNONAA >60  --  >60 >60  GFRAA >60  --  >60 >60  ANIONGAP 12  --  8 7     Hematology Recent Labs  Lab 02/21/18 0536 02/22/18 0340 02/23/18 0550  WBC 13.5* 11.8* 14.1*  RBC 4.61 5.07 4.49  HGB 13.1 14.7 12.9*  HCT 42.0 47.2 41.0  MCV 91.1 93.1 91.3  MCH 28.4 29.0 28.7  MCHC 31.2 31.1 31.5  RDW 13.4 13.7 13.6  PLT 256 238 228    Cardiac Enzymes Recent Labs  Lab 02/20/18 2331 02/21/18 0536 02/21/18 1132 02/21/18 1829  TROPONINI >65.00* >65.00* >65.00* >65.00*   No results for input(s): TROPIPOC in the last 168 hours.   BNP Recent Labs  Lab 02/20/18 2331  BNP 14.9     DDimer No results for input(s): DDIMER in the last 168 hours.   Radiology    No results found.  Cardiac Studies   Left heart cath 02/20/2018  Anterior ST elevation myocardial infarction due to proximal occlusion of the LAD.  Successful PCI and stent reducing 100% stenosis  with 0 TIMI flow to 0% with TIMI grade III flow using a 28 x 3.0 mm Synergy drug-eluting stent. Postdilatation was not performed. Deployment pressure 12 atm.  Mild distal left main disease, 20%  95% ostial proximal small to moderate size ramus intermedius.  Moderate to moderately severe circumflex disease with ostial 70 to 80% stenosis, dominant obtuse marginal with ostial 70% stenosis, and proximal 50 to 60% circumflex.  RCA is dominant with eccentric 70% stenosis.  Anterior and anteroapical akinesis with EF 35%. LVEDP (24 mmHg) is elevated and consistent with acute systolic heart failure.  RECOMMENDATIONS:   Guideline directed therapy for acute ischemic systolic heart failure: Beta-blocker, ARB therapy. Diuretic therapy as needed if congestion.  Aggressive secondary risk factor modification using high intensity statin therapy and encouraging the patient to discontinue smoking. Target blood pressure 130/80 mmHg or less.  Closely review the patient's medical regimen for Crohn's / systemic vasculitis/hypereosinophilic syndrome and be certain to use all important medications. We will continue prednisone at 20 mg/day but watch for evidence of hypo-adrenal ism and potentially consider a dose or 2 of stress level glucocorticoid therapy if needed.  Cangrelor will be discontinued 2 hours after the Brilinta slurry was swallowed.  Moderate to moderately severe residual coronary disease in the circumflex, ramus, and RCA will be managed with medical therapy.  Recommend uninterrupted dual antiplatelet therapy with Aspirin 51m daily and Ticagrelor 931mtwice dailyfor a minimum of .   Coronary angiography 02/21/2018  Anatomy is stable when compared to immediate post PCI results. There is TIMI grade III flow in the LAD and circumflex. The proximal LAD stent is widely patent and as before there is proximal plaque obstructing the vessel up to 40%.  RECOMMENDATIONS:   We will give  40 mg of prednisone orally today which is twice his typical dose.  Start colchicine 0.6 mg twice daily.  2D Doppler echocardiogram is already been ordered.  We will give a stronger analgesic and also provide benzodiazepine therapy as the patient is very concerned that he cannot sleep.  If the patient develops progression in the disease proximal to the stented segment, coronary artery bypass grafting would be his best treatment option.   Recommend uninterrupted dual antiplatelet therapy with Aspirin 8148maily and Ticagrelor 73m61mice dailyfor a minimum of 12 months (ACS - Class I recommendation).  TTE  02/22/18 LV EF: 35% -   40%  ------------------------------------------------------------------- Indications:      CHF - 428.0.  ------------------------------------------------------------------- Study Conclusions  - Left ventricle: The cavity size was normal. Wall thickness was   normal. Systolic function was moderately reduced. The estimated   ejection fraction was in the range of 35% to 40%. Doppler   parameters are consistent with abnormal left ventricular   relaxation (grade 1 diastolic dysfunction). Spontaneous echo   contrast, sludge, and probable apical echo density in the left   ventricle with concern for LV apical thrombus. - Regional wall motion abnormality: Akinesis of the apical   anterior, mid anteroseptal, mid inferoseptal, apical inferior,   apical septal, and apical myocardium; severe hypokinesis of the   mid anterior myocardium; mild hypokinesis of the basal   anteroseptal myocardium. - Aortic valve: Transvalvular velocity was within the normal range.   There was no stenosis. There was no regurgitation. - Mitral valve: Transvalvular velocity was within the normal range.   There was no evidence for stenosis. There was no regurgitation. - Left atrium: The atrium was normal in size. - Right ventricle: The cavity size was normal. Wall thickness was   normal.  Systolic function was normal. - Right atrium: The atrium was normal in size. Central venous   pressure (est): 3 mm Hg. - Tricuspid valve: Transvalvular velocity was within the normal   range. There was trivial regurgitation. - Inferior vena cava: The vessel was normal in size. The   respirophasic diameter changes were in the normal range (= 50%),   consistent with normal central venous pressure. - Pericardium, extracardiac: There was no pericardial effusion.  Impressions:  - Spontaneous echo contrast, sludge, and probable apical echo   density in the left ventricle suggest LV apical thrombus.  Recommendations:  This procedure has been discussed with the referring physician (Dr. Irish Lack at 13:15 02/22/18) Patient Profile     52 y.o. male with Crohn's disease, eosinophilic esophagitis, vasculitis on chronic prednisone, essential hypertension who presented on 02/20/2018 with 5-hour history of chest pain, nausea, diaphoresis after taking cialis and engaging in intercourse.  Initial EKG showed hyperacute T waves and ST elevation V4-V6. Patient was unable to be placed on NTG due to recent cialis use. Patient taken for emergency cath which showed 100% stenosis OST LAD to proximal LAD, 90% stenosis OST ramus, 70% stenosis no ST CX to proximal CX.  Stent was placed in OST LAD to proximal LAD after angioplasty was performed.  Assessment & Plan    Anterior STEMI Left heart cath done 11/23 post stent placement in OSD LAD to proximal LAD. Cangrelor was used during procedure.   Patient appears to be chest pain free and without dyspnea. He developed complications of pericarditis as well as a large lv thrombus.   Blood pressures have been ranging 80-110s/60-80s with rates ranging 70-90s.   -Atorvastatin 80 mg daily -Continue aspirin -Stopped brillinta  -Started plavix 02/22/18 -Continue carvedilol 3.125 mg twice daily -Continue losartan 25 mg daily -Continue colchicine 0.6 mg twice  daily  Post MI Pericarditis Patient with pleuritic chest pain starting after cath on 02/20/18. Patient re-cathed 02/21/18 which did not show any new obstruction.   EKG from 02/22/18 showed diffuse st elevation in all leads.   -Continue colchicine 0.6 mg twice daily -Continue aspirin 3m qd -Continue Ativan 0.5 mg every 6 hours as needed -Continue hydroxyzine 100 mg nightly  Left Ventricular Thrombus TTE done 11/25 showed LVEF 35-40%, g1dd, akinesis of apical anterior septal  and mild hypokinesis of basal anteroseptal myocardium. There was a left ventricular apical thrombus found.   -Started patient on heparin with bridge to warfarin and plavix load (656m followed by 719mqd)  History of vasculitis Patient takes prednisone 20 mg daily with breakfast at home.  Patient was given an additional 10 mg of prednisone yesterday 11/25.  Essential hypertension Patient's blood pressure has been ranging 90-110/60-80s over the past 24 hours and rates ranging 70-80s.  -Continue carvedilol 3.125 mg twice daily -Continue losartan 25 mg daily  Crohns disease Patient is supposed to be taking azathioprine and taper down prednisone per GI and Rheum recommendations, but the patient has not been able to tolerate azathioprine and therefore has not taken it in the past few months.   -Continue prednisone 2028md -Continue to follow with Rheumatology and GI outpatient    For questions or updates, please contact [email or web address] Please consult [email or web address] for contact info under        Signed, VahLars MageD  02/23/2018, 7:18 AM    I have examined the patient and reviewed assessment and plan and discussed with patient.  Agree with above as stated.    I personally reviewed the echo images.  THere is an akinetic apex with apparent mobile thrombus.  LVEF is severely decreased and is 30-35%.    Warfarin started.  DAPT changed to Plavix and aspirin, due to starting Warfarin.    Continue home  dose of Prednisone, this will help pericarditis as well. DO not want to increase the dose of aspirin due to bleeding risk.  Will plan for life vest as well.   Medical therapy for systolic dysfunction and CAD.  D/w Dr. SmiTamala JulianWOuld not plan on another PCI while in the hospital.  JayLarae Grooms

## 2018-02-23 NOTE — Telephone Encounter (Signed)
Pt's wife Mickel Baas called to let you know that they will be out of the [place] from Jan 4 until Apr 09, 2018, she received a message through my chart stating that Dr. [name] wanted to see pt in January but she was not sure if it is for a ov or for procedure, either way she wanted to make you aware.

## 2018-02-24 ENCOUNTER — Telehealth: Payer: Self-pay | Admitting: Interventional Cardiology

## 2018-02-24 DIAGNOSIS — E782 Mixed hyperlipidemia: Secondary | ICD-10-CM

## 2018-02-24 DIAGNOSIS — E785 Hyperlipidemia, unspecified: Secondary | ICD-10-CM

## 2018-02-24 DIAGNOSIS — Z7901 Long term (current) use of anticoagulants: Secondary | ICD-10-CM

## 2018-02-24 DIAGNOSIS — I241 Dressler's syndrome: Secondary | ICD-10-CM

## 2018-02-24 DIAGNOSIS — I236 Thrombosis of atrium, auricular appendage, and ventricle as current complications following acute myocardial infarction: Secondary | ICD-10-CM

## 2018-02-24 LAB — CBC
HEMATOCRIT: 40.8 % (ref 39.0–52.0)
HEMOGLOBIN: 12.9 g/dL — AB (ref 13.0–17.0)
MCH: 28.9 pg (ref 26.0–34.0)
MCHC: 31.6 g/dL (ref 30.0–36.0)
MCV: 91.5 fL (ref 80.0–100.0)
Platelets: 242 10*3/uL (ref 150–400)
RBC: 4.46 MIL/uL (ref 4.22–5.81)
RDW: 13.6 % (ref 11.5–15.5)
WBC: 13.2 10*3/uL — AB (ref 4.0–10.5)
nRBC: 0 % (ref 0.0–0.2)

## 2018-02-24 LAB — PROTIME-INR
INR: 1.61
Prothrombin Time: 19 seconds — ABNORMAL HIGH (ref 11.4–15.2)

## 2018-02-24 LAB — HEPARIN LEVEL (UNFRACTIONATED): Heparin Unfractionated: 0.3 IU/mL (ref 0.30–0.70)

## 2018-02-24 MED ORDER — LOSARTAN POTASSIUM 25 MG PO TABS: 25.0000 mg | ORAL_TABLET | Freq: Every day | ORAL | 3 refills | Status: DC

## 2018-02-24 MED ORDER — NITROGLYCERIN 0.4 MG SL SUBL: 0.4000 mg | SUBLINGUAL_TABLET | SUBLINGUAL | 12 refills | Status: DC | PRN

## 2018-02-24 MED ORDER — WARFARIN SODIUM 7.5 MG PO TABS: 7.5000 mg | ORAL_TABLET | Freq: Once | ORAL | Status: DC

## 2018-02-24 MED ORDER — ENOXAPARIN SODIUM 120 MG/0.8ML ~~LOC~~ SOLN
120.0000 mg | SUBCUTANEOUS | Status: DC
  Administered 2018-02-24: 120 mg via SUBCUTANEOUS
  Filled 2018-02-24: qty 0.8

## 2018-02-24 MED ORDER — CARVEDILOL 3.125 MG PO TABS: 3.1250 mg | ORAL_TABLET | Freq: Two times a day (BID) | ORAL | 3 refills | Status: DC

## 2018-02-24 MED ORDER — DIPHENHYDRAMINE HCL 25 MG PO CAPS
25.0000 mg | ORAL_CAPSULE | Freq: Four times a day (QID) | ORAL | Status: DC | PRN
  Administered 2018-02-24: 25 mg via ORAL
  Filled 2018-02-24 (×2): qty 1

## 2018-02-24 MED ORDER — COUMADIN BOOK
Freq: Once | Status: DC
  Filled 2018-02-24: qty 1

## 2018-02-24 MED ORDER — ENOXAPARIN SODIUM 120 MG/0.8ML ~~LOC~~ SOLN: 120.0000 mg | SUBCUTANEOUS | 0 refills | Status: DC

## 2018-02-24 MED ORDER — COLCHICINE 0.6 MG PO TABS: 0.6000 mg | ORAL_TABLET | Freq: Two times a day (BID) | ORAL | 2 refills | Status: DC

## 2018-02-24 MED ORDER — WARFARIN VIDEO: Freq: Once | Status: DC

## 2018-02-24 MED ORDER — OXYCODONE HCL 5 MG PO TABS: 5.0000 mg | ORAL_TABLET | Freq: Four times a day (QID) | ORAL | 0 refills | Status: DC | PRN

## 2018-02-24 MED ORDER — PANTOPRAZOLE SODIUM 40 MG PO TBEC: 40.0000 mg | DELAYED_RELEASE_TABLET | Freq: Every day | ORAL | 2 refills | Status: DC

## 2018-02-24 MED ORDER — CLOPIDOGREL BISULFATE 75 MG PO TABS: 75.0000 mg | ORAL_TABLET | Freq: Every day | ORAL | 12 refills | Status: DC

## 2018-02-24 MED ORDER — WARFARIN SODIUM 5 MG PO TABS: ORAL_TABLET | ORAL | 0 refills | Status: DC

## 2018-02-24 MED ORDER — ASPIRIN 81 MG PO TBEC: 81.0000 mg | DELAYED_RELEASE_TABLET | Freq: Every day | ORAL | Status: DC

## 2018-02-24 MED ORDER — FUROSEMIDE 40 MG PO TABS: 40.0000 mg | ORAL_TABLET | Freq: Every day | ORAL | 3 refills | Status: DC | PRN

## 2018-02-24 MED ORDER — ATORVASTATIN CALCIUM 80 MG PO TABS: 80.0000 mg | ORAL_TABLET | Freq: Every day | ORAL | 3 refills | Status: DC

## 2018-02-24 NOTE — Discharge Instructions (Signed)
- Tonight (02/24/2018), take 1.5 tablets of Coumadin. You will also receive an injection of Lovenox 150m tonight before you leave the hospital. - Tomorrow (02/25/2018), take 1 tablet of Coumadin and give Lovenox 1271minjection.  - On Friday 02/26/2018, you will need to go to [facility] and have your INR checked. [place] [address] [id number] INR results will be sent to our on-call provider who will then call you and let you know about any medication adjustments.  - On Monday 03/01/2018, [facility]   We have prescribed Protonix 4054mo be taken daily to help protect the lining of your stomach since you are on multiple blood thinners.   Take all other medications as directed.   You have a hospital follow-up appointment scheduled on Thursday 03/04/2018 at [facility]. Please arrive 15 minutes early for check in.   _______________  Post-STEMI: NO HEAVY LIFTING X 4 WEEKS. NO SEXUAL ACTIVITY X 4 WEEKS. NO DRIVING X 2 WEEKS. NO SOAKING BATHS, HOT TUBS, POOLS, ETC., X 7 DAYS.  Radial Site Care Refer to this sheet in the next few weeks. These instructions provide you with information on caring for yourself after your procedure. Your caregiver may also give you more specific instructions. Your treatment has been planned according to current medical practices, but problems sometimes occur. Call your caregiver if you have any problems or questions after your procedure. HOME CARE INSTRUCTIONS  You may shower the day after the procedure.Remove the bandage (dressing) and gently wash the site with plain soap and water.Gently pat the site dry.   Do not apply powder or lotion to the site.   Do not submerge the affected site in water for 3 to 5 days.   Inspect the site at least twice daily.   Do not flex or bend the affected arm for 24 hours.   No lifting over 5 pounds  (2.3 kg) for 5 days after your procedure.   Do not drive home if you are discharged the same day of the procedure. Have someone else drive you.   You may drive 24 hours after the procedure unless otherwise instructed by your caregiver.  What to expect:  Any bruising will usually fade within 1 to 2 weeks.   Blood that collects in the tissue (hematoma) may be painful to the touch. It should usually decrease in size and tenderness within 1 to 2 weeks.  SEEK IMMEDIATE MEDICAL CARE IF:  You have unusual pain at the radial site.   You have redness, warmth, swelling, or pain at the radial site.   You have drainage (other than a small amount of blood on the dressing).   You have chills.   You have a fever or persistent symptoms for more than 72 hours.   You have a fever and your symptoms suddenly get worse.   Your arm becomes pale, cool, tingly, or numb.   You have heavy bleeding from the site. Hold pressure on the site.    Information on my medicine - Coumadin   (Warfarin) This medication education was reviewed with me or my healthcare representative as part of my discharge preparation.  The pharmacist that spoke with me during my hospital stay was:  ANNCrowderPHHawardenhy was Coumadin prescribed for you? Coumadin was prescribed for you because you have a blood clot or  a medical condition that can cause an increased risk of forming blood clots. Blood clots can cause serious health problems by blocking the flow of blood to the heart, lung, or brain. Coumadin can prevent harmful blood clots from forming. As a reminder your indication for Coumadin is:   Blood Clotting Disorder   What test will check on my response to Coumadin? While on Coumadin (warfarin) you will need to have an INR test regularly to ensure that your dose is keeping you in the desired range. The INR (international normalized ratio) number is calculated from the result of the laboratory test called prothrombin time  (PT).  If an INR APPOINTMENT HAS NOT ALREADY BEEN MADE FOR YOU please schedule an appointment to have this lab work done by your health care provider within 7 days. Your INR goal is usually a number between:  2 to 3 or your provider may give you a more narrow range like 2-2.5.  Ask your health care provider during an office visit what your goal INR is.  What  do you need to  know  About  COUMADIN? Take Coumadin (warfarin) exactly as prescribed by your healthcare provider about the same time each day.  DO NOT stop taking without talking to the doctor who prescribed the medication.  Stopping without other blood clot prevention medication to take the place of Coumadin may increase your risk of developing a new clot or stroke.  Get refills before you run out.  What do you do if you miss a dose? If you miss a dose, take it as soon as you remember on the same day then continue your regularly scheduled regimen the next day.  Do not take two doses of Coumadin at the same time.  Important Safety Information A possible side effect of Coumadin (Warfarin) is an increased risk of bleeding. You should call your healthcare provider right away if you experience any of the following: ? Bleeding from an injury or your nose that does not stop. ? Unusual colored urine (red or dark brown) or unusual colored stools (red or black). ? Unusual bruising for unknown reasons. ? A serious fall or if you hit your head (even if there is no bleeding).  Some foods or medicines interact with Coumadin (warfarin) and might alter your response to warfarin. To help avoid this: ? Eat a balanced diet, maintaining a consistent amount of Vitamin K. ? Notify your provider about major diet changes you plan to make. ? Avoid alcohol or limit your intake to 1 drink for women and 2 drinks for men per day. (1 drink is 5 oz. wine, 12 oz. beer, or 1.5 oz. liquor.)  Make sure that ANY health care provider who prescribes medication for you  knows that you are taking Coumadin (warfarin).  Also make sure the healthcare provider who is monitoring your Coumadin knows when you have started a new medication including herbals and non-prescription products.  Coumadin (Warfarin)  Major Drug Interactions  Increased Warfarin Effect Decreased Warfarin Effect  Alcohol (large quantities) Antibiotics (esp. Septra/Bactrim, Flagyl, Cipro) Amiodarone (Cordarone) Aspirin (ASA) Cimetidine (Tagamet) Megestrol (Megace) NSAIDs (ibuprofen, naproxen, etc.) Piroxicam (Feldene) Propafenone (Rythmol SR) Propranolol (Inderal) Isoniazid (INH) Posaconazole (Noxafil) Barbiturates (Phenobarbital) Carbamazepine (Tegretol) Chlordiazepoxide (Librium) Cholestyramine (Questran) Griseofulvin Oral Contraceptives Rifampin Sucralfate (Carafate) Vitamin K   Coumadin (Warfarin) Major Herbal Interactions  Increased Warfarin Effect Decreased Warfarin Effect  Garlic Ginseng Ginkgo biloba Coenzyme Q10 Green tea St. Johns wort    Coumadin (Warfarin) FOOD Interactions  Eat  a consistent number of servings per week of foods HIGH in Vitamin K (1 serving =  cup)  Collards (cooked, or boiled & drained) Kale (cooked, or boiled & drained) Mustard greens (cooked, or boiled & drained) Parsley *serving size only =  cup Spinach (cooked, or boiled & drained) Swiss chard (cooked, or boiled & drained) Turnip greens (cooked, or boiled & drained)  Eat a consistent number of servings per week of foods MEDIUM-HIGH in Vitamin K (1 serving = 1 cup)  Asparagus (cooked, or boiled & drained) Broccoli (cooked, boiled & drained, or raw & chopped) Brussel sprouts (cooked, or boiled & drained) *serving size only =  cup Lettuce, raw (green leaf, endive, romaine) Spinach, raw Turnip greens, raw & chopped   These websites have more information on Coumadin (warfarin):  FailFactory.se; VeganReport.com.au;

## 2018-02-24 NOTE — Care Management Note (Signed)
Case Management Note  Patient Details  Name: HYRUM SHANEYFELT MRN: [id number] Date of Birth: 09-Oct-1965  Subjective/Objective:    Chest pain s/p PCI                Action/Plan: Contacted [facility] and copay $10 for Lovenox. Pt will need to pick up at the [facility] on [address] in [place].   Expected Discharge Date:  02/24/2018                Expected Discharge Plan:  Home/Self Care  In-House Referral:  NA  Discharge planning Services  CM Consult, Medication Assistance  Post Acute Care Choice:  NA Choice offered to:  NA  DME Arranged:  N/A DME Agency:  NA  HH Arranged:  NA HH Agency:  NA  Status of Service:  Completed, signed off  If discussed at [place] of Stay Meetings, dates discussed:    Additional Comments:  Erenest Rasher, RN 02/24/2018, 2:58 PM

## 2018-02-24 NOTE — Progress Notes (Signed)
Prior to placement MD Irish Lack and PA walked in the room and stated the patient would more then likely be discharged today on lovenox and would not need PIV at this time. RN Adriene made aware

## 2018-02-24 NOTE — Progress Notes (Addendum)
Progress Note  Patient Name: Jack [name] Date of Encounter: 02/24/2018  Primary Cardiologist: No primary care provider on file.   Subjective   Patient stated that he does not have any chest pain or dyspnea. He has been having pain in his right calf which he states he has had before. He is has a spinal stimulator in place.  Inpatient Medications    Scheduled Meds: . aspirin EC  81 mg Oral Daily  . atorvastatin  80 mg Oral q1800  . carvedilol  3.125 mg Oral BID WC  . clopidogrel  75 mg Oral Daily  . colchicine  0.6 mg Oral BID  . hydrOXYzine  100 mg Oral QHS  . losartan  25 mg Oral Daily  . multivitamin with minerals  1 tablet Oral Daily  . predniSONE  20 mg Oral Q breakfast  . sodium chloride flush  3 mL Intravenous Q12H  . sodium chloride flush  3 mL Intravenous Q12H  . tamsulosin  0.4 mg Oral QHS  . Warfarin - Pharmacist Dosing Inpatient   Does not apply q1800   Continuous Infusions: . sodium chloride Stopped (02/21/18 1940)  . sodium chloride Stopped (02/21/18 1940)  . heparin 1,100 Units/hr (02/23/18 1100)   PRN Meds: sodium chloride, sodium chloride, acetaminophen, alum & mag hydroxide-simeth, diphenhydrAMINE, HYDROmorphone (DILAUDID) injection, LORazepam, ondansetron (ZOFRAN) IV, oxyCODONE, sodium chloride flush, sodium chloride flush   Vital Signs    Vitals:   02/23/18 1700 02/23/18 1838 02/23/18 2028 02/24/18 0411  BP:  113/78 (!) 109/99 97/61  Pulse:      Resp: (!) 24  16 15   Temp:  98.8 F (37.1 C) 98 F (36.7 C) 97.6 F (36.4 C)  TempSrc:  Oral Oral Oral  SpO2:   100% 100%  Weight:      Height:        Intake/Output Summary (Last 24 hours) at 02/24/2018 0734 Last data filed at 02/24/2018 0300 Gross per 24 hour  Intake 211.58 ml  Output 0 ml  Net 211.58 ml   Filed Weights   02/21/18 0500 02/22/18 0600 02/23/18 0500  Weight: 77.4 kg 75.1 kg 76.2 kg    Telemetry    Normal sinus rhythm- Personally Reviewed  ECG    None -  Personally Reviewed  Physical Exam   Physical Exam  Constitutional: Appears well-developed and well-nourished. No distress.  HENT:  Head: Normocephalic and atraumatic.  Eyes: Conjunctivae are normal.  Cardiovascular: Normal rate, regular rhythm and normal heart sounds.  Respiratory: Effort normal and breath sounds normal. No respiratory distress. No wheezes.  GI: Soft. Bowel sounds are normal. No distension. There is no tenderness.  Musculoskeletal: No edema.  Neurological: Is alert.  Skin: Not diaphoretic. No erythema.  Psychiatric: Normal mood and affect. Behavior is normal. Judgment and thought content normal.    Labs    Chemistry Recent Labs  Lab 02/20/18 2052 02/20/18 2127 02/21/18 0248 02/22/18 0340  NA 135 126* 134* 135  K 2.9* 2.8* 3.6 5.1  CL 101 88* 102 107  CO2 22  --  24 21*  GLUCOSE 147* 129* 117* 126*  BUN 23* 24* 17 13  CREATININE 1.21 0.80 0.96 1.07  CALCIUM 8.1*  --  8.2* 8.8*  PROT 6.7  --   --   --   ALBUMIN 3.5  --   --   --   AST 21  --   --   --   ALT 19  --   --   --  ALKPHOS 56  --   --   --   BILITOT 0.7  --   --   --   GFRNONAA >60  --  >60 >60  GFRAA >60  --  >60 >60  ANIONGAP 12  --  8 7     Hematology Recent Labs  Lab 02/22/18 0340 02/23/18 0550 02/24/18 0408  WBC 11.8* 14.1* 13.2*  RBC 5.07 4.49 4.46  HGB 14.7 12.9* 12.9*  HCT 47.2 41.0 40.8  MCV 93.1 91.3 91.5  MCH 29.0 28.7 28.9  MCHC 31.1 31.5 31.6  RDW 13.7 13.6 13.6  PLT 238 228 242    Cardiac Enzymes Recent Labs  Lab 02/20/18 2331 02/21/18 0536 02/21/18 1132 02/21/18 1829  TROPONINI >65.00* >65.00* >65.00* >65.00*   No results for input(s): TROPIPOC in the last 168 hours.   BNP Recent Labs  Lab 02/20/18 2331  BNP 14.9     DDimer No results for input(s): DDIMER in the last 168 hours.   Radiology    No results found.  Cardiac Studies   Left heart cath 02/20/2018  Anterior ST elevation myocardial infarction due to proximal occlusion of the  LAD.  Successful PCI and stent reducing 100% stenosis with 0 TIMI flow to 0% with TIMI grade III flow using a 28 x 3.0 mm Synergy drug-eluting stent. Postdilatation was not performed. Deployment pressure 12 atm.  Mild distal left main disease, 20%  95% ostial proximal small to moderate size ramus intermedius.  Moderate to moderately severe circumflex disease with ostial 70 to 80% stenosis, dominant obtuse marginal with ostial 70% stenosis, and proximal 50 to 60% circumflex.  RCA is dominant with eccentric 70% stenosis.  Anterior and anteroapical akinesis with EF 35%. LVEDP (24 mmHg) is elevated and consistent with acute systolic heart failure.  RECOMMENDATIONS:   Guideline directed therapy for acute ischemic systolic heart failure: Beta-blocker, ARB therapy. Diuretic therapy as needed if congestion.  Aggressive secondary risk factor modification using high intensity statin therapy and encouraging the patient to discontinue smoking. Target blood pressure 130/80 mmHg or less.  Closely review the patient's medical regimen for Crohn's / systemic vasculitis/hypereosinophilic syndrome and be certain to use all important medications. We will continue prednisone at 20 mg/day but watch for evidence of hypo-adrenal ism and potentially consider a dose or 2 of stress level glucocorticoid therapy if needed.  Cangrelor will be discontinued 2 hours after the Brilinta slurry was swallowed.  Moderate to moderately severe residual coronary disease in the circumflex, ramus, and RCA will be managed with medical therapy.  Recommend uninterrupted dual antiplatelet therapy with Aspirin 64m daily and Ticagrelor 961mtwice dailyfor a minimum of .   Coronary angiography 02/21/2018  Anatomy is stable when compared to immediate post PCI results. There is TIMI grade III flow in the LAD and circumflex. The proximal LAD stent is widely patent and as before there is proximal plaque obstructing the  vessel up to 40%.  RECOMMENDATIONS:   We will give 40 mg of prednisone orally today which is twice his typical dose.  Start colchicine 0.6 mg twice daily.  2D Doppler echocardiogram is already been ordered.  We will give a stronger analgesic and also provide benzodiazepine therapy as the patient is very concerned that he cannot sleep.  If the patient develops progression in the disease proximal to the stented segment, coronary artery bypass grafting would be his best treatment option.   Recommend uninterrupted dual antiplatelet therapy with Aspirin 813maily and Ticagrelor 69m74mice dailyfor  a minimum of 12 months (ACS - Class I recommendation).  TTE 02/22/18 LV EF: 35% - 40%  ------------------------------------------------------------------- Indications: CHF - 428.0.  ------------------------------------------------------------------- Study Conclusions  - Left ventricle: The cavity size was normal. Wall thickness was normal. Systolic function was moderately reduced. The estimated ejection fraction was in the range of 35% to 40%. Doppler parameters are consistent with abnormal left ventricular relaxation (grade 1 diastolic dysfunction). Spontaneous echo contrast, sludge, and probable apical echo density in the left ventricle with concern for LV apical thrombus. - Regional wall motion abnormality: Akinesis of the apical anterior, mid anteroseptal, mid inferoseptal, apical inferior, apical septal, and apical myocardium; severe hypokinesis of the mid anterior myocardium; mild hypokinesis of the basal anteroseptal myocardium. - Aortic valve: Transvalvular velocity was within the normal range. There was no stenosis. There was no regurgitation. - Mitral valve: Transvalvular velocity was within the normal range. There was no evidence for stenosis. There was no regurgitation. - Left atrium: The atrium was normal in size. - Right  ventricle: The cavity size was normal. Wall thickness was normal. Systolic function was normal. - Right atrium: The atrium was normal in size. Central venous pressure (est): 3 mm Hg. - Tricuspid valve: Transvalvular velocity was within the normal range. There was trivial regurgitation. - Inferior vena cava: The vessel was normal in size. The respirophasic diameter changes were in the normal range (= 50%), consistent with normal central venous pressure. - Pericardium, extracardiac: There was no pericardial effusion.  Impressions:  - Spontaneous echo contrast, sludge, and probable apical echo density in the left ventricle suggest LV apical thrombus.  Recommendations: This procedure has been discussed with the referring physician (Dr. Irish Lack at 13:15 02/22/18)  Patient Profile     52 y.o. male with Crohn's disease, eosinophilic esophagitis, vasculitis on chronic prednisone, essential hypertension who presented on 02/20/2018 with 5-hour history of chest pain, nausea, diaphoresis after takingcialisand engaging in intercourse. Initial EKG showed hyperacute T waves and ST elevation V4-V6.Patient was unable to be placed on NTG due to recent cialis use.Patient taken foremergency cath which showed 100% stenosis OST LAD to proximal LAD, 90% stenosis OST ramus, 70% stenosis no ST CX to proximal CX.Stent was placed in OST LAD to proximal LAD after angioplasty was performed.  Assessment & Plan    Anterior STEMI Left heart cath done 11/23 post stent placement in OSD LAD to proximal LAD. With complications of lv thrombus and pericarditis post cath.   Blood pressure ranging 90-110/60-90s and rates ranging 60-70s.  -Atorvastatin 80 mg daily -Continue aspirin 85m, do not want higher dose due to bleeding risk -Continue plavix  -Continue carvedilol 3.125 mg twice daily -Continue losartan 25 mg daily -Continue colchicine 0.6 mg twice daily  Post MI Pericarditis EKG from  02/22/18 showed diffuse st elevation in all leads. Patient re-cathed 02/21/18 which did not show any new obstruction.   -Continue colchicine 0.6 mg twice daily -Continue aspirin 85mqd  Left Ventricular Thrombus TTE done 11/25 showed a left ventricular apical thrombus. Patient on blood thinners. INR=1.61 with goal between 2.5-3.0. May need one more day in hospital prior to discharge.   -Continue heparin bridge to warfarin -Continue plavix 7546md  History of vasculitis  -Continue prednisone 52m47m  Essential hypertension Blood pressure ranging 90-110/60-90s and rates ranging 60-70s.  -Continue carvedilol 3.125 mg twice daily -Continue losartan 25 mg daily  Crohns disease  -Continue prednisone 52mg37m-Continue to follow with Rheumatology and GI outpatient   Acute right calf pain  of unknown origin Patient states that he has been having right calf pain overnight. He is being following for the pain with [facility]. He was told that the pain may be from lumbar spinal pathology. He was to avoid running.   -oxycodone 5-49m q4hrs prn  -dilaudid 1100mq4hrs prn  Anxiety  -Continue Ativan 0.5 mg every 6 hours as needed -Continue hydroxyzine 100 mg nightly  For questions or updates, please contact [facility] consult [email or web address] for contact info under        Signed, VaLars MageMD  02/24/2018, 7:34 AM    I have examined the patient and reviewed assessment and plan and discussed with patient.  Agree with above as stated. S/p LAD stent for anterior MI. Doing well from a cardiac standpoint.  Appears euvolemic.  COntinue aggressive secondary prevention.    Prednisone for Crohn's.    Not using NSAIDs for post-MI pericarditis given bleeding risk, ad the fact that he was already on prednisone at baseline.  Left ventricular thrombus.  Await INR around 2.0 before discharge. IV heparin until that time. Aspirin and PLavix as well for the  stent.   Spoke about right leg pain.  She has a spine stimulator that is off.  [facility].  He has f/u with [facility] on Monday.  Will plan to continue f/u with sports medicine clinic as an outpatient.  Pain meds ordered at this time. Feels better when he walks.  I encouraged him to ambulate in the halls and sit in the chair.   JaLarae Grooms

## 2018-02-24 NOTE — Progress Notes (Signed)
Jack [name] to be D/C'd Home per MD order. Discussed with the patient and all questions fully answered.    IV catheter discontinued intact. Site without signs and symptoms of complications. Dressing and pressure applied.  An After Visit Summary was printed and given to the patient.  Patient escorted via [place], and D/C home via private auto.  Jack [name]  02/24/2018 4:08 PM

## 2018-02-24 NOTE — Care Management (Signed)
#   2.   S/W ERIN @ [facility] #  [phone]   1. LOVENOX   120 MG QD 7 DOSES / SYRINGES COVER- : NOT COVER  PRIOR APPROVAL- YES # [id number]  OPT- 3 , OPT-2  2. ENOXAPARIN  120 MG  QD 7 DOSES /  SYRINGES COVER- YES CO-PAY- $ 10.00 TIER- NO PRIOR APPROVAL- NO  PREFERRED PHARMACY : YES   [facility]

## 2018-02-24 NOTE — Progress Notes (Deleted)
Progress Note  Patient Name: Jack [name] Date of Encounter: 02/24/2018  Primary Cardiologist: No primary care provider on file.   Subjective   No significant overnight events. Patient is very tired today - He states he did not sleep well last night due to the pain in his legs from his sciatica. No chest pain or shortness of breath.   Inpatient Medications    Scheduled Meds: . aspirin EC  81 mg Oral Daily  . atorvastatin  80 mg Oral q1800  . carvedilol  3.125 mg Oral BID WC  . clopidogrel  75 mg Oral Daily  . colchicine  0.6 mg Oral BID  . coumadin book   Does not apply Once  . hydrOXYzine  100 mg Oral QHS  . losartan  25 mg Oral Daily  . multivitamin with minerals  1 tablet Oral Daily  . predniSONE  20 mg Oral Q breakfast  . sodium chloride flush  3 mL Intravenous Q12H  . sodium chloride flush  3 mL Intravenous Q12H  . tamsulosin  0.4 mg Oral QHS  . warfarin  7.5 mg Oral ONCE-1800  . warfarin   Does not apply Once  . Warfarin - Pharmacist Dosing Inpatient   Does not apply q1800   Continuous Infusions: . sodium chloride Stopped (02/21/18 1940)  . sodium chloride Stopped (02/21/18 1940)  . heparin 1,150 Units/hr (02/24/18 0814)   PRN Meds: sodium chloride, sodium chloride, acetaminophen, alum & mag hydroxide-simeth, diphenhydrAMINE, HYDROmorphone (DILAUDID) injection, LORazepam, ondansetron (ZOFRAN) IV, oxyCODONE, sodium chloride flush, sodium chloride flush   Vital Signs    Vitals:   02/23/18 1700 02/23/18 1838 02/23/18 2028 02/24/18 0411  BP:  113/78 (!) 109/99 97/61  Pulse:      Resp: (!) _0 Temp:  98.8 F (37.1 C) 98 F (36.7 C) 97.6 F (36.4 C)  TempSrc:  Oral Oral Oral  SpO2:   100% 100%  Weight:      Height:        Intake/Output Summary (Last 24 hours) at 02/24/2018 0956 Last data filed at 02/24/2018 0300 Gross per 24 hour  Intake 189.94 ml  Output 0 ml  Net 189.94 ml   Filed Weights   02/21/18 0500 02/22/18 0600 02/23/18 0500    Weight: 77.4 kg 75.1 kg 76.2 kg    Telemetry    Sinus rhythm with heart rate between upper 50's and 80's. - Personally Reviewed  Physical Exam   GEN: 52 year old Caucasian male resting comfortably. Alert and in no acute distress.   Head: Normocephalic and atraumatic.  Neck: Supple. Cardiac: RRR. No murmurs, rubs, or gallops.  Respiratory: No increased work of breathing. Clear to auscultation bilaterally. GI: Abdomen soft, non-tender, non-distended. Bowel sounds present.  MS: No lower extremity edema. No deformity. Skin: Warm and dry. Large ecchymosis on right arm. Neuro: No focal deficits. Psych: Normal affect.  Labs    Chemistry Recent Labs  Lab 02/20/18 2052 02/20/18 2127 02/21/18 0248 02/22/18 0340  NA 135 126* 134* 135  K 2.9* 2.8* 3.6 5.1  CL 101 88* 102 107  CO2 22  --  24 21*  GLUCOSE 147* 129* 117* 126*  BUN 23* 24* 17 13  CREATININE 1.21 0.80 0.96 1.07  CALCIUM 8.1*  --  8.2* 8.8*  PROT 6.7  --   --   --   ALBUMIN 3.5  --   --   --   AST 21  --   --   --  ALT 19  --   --   --   ALKPHOS 56  --   --   --   BILITOT 0.7  --   --   --   GFRNONAA >60  --  >60 >60  GFRAA >60  --  >60 >60  ANIONGAP 12  --  8 7     Hematology Recent Labs  Lab 02/22/18 0340 02/23/18 0550 02/24/18 0408  WBC 11.8* 14.1* 13.2*  RBC 5.07 4.49 4.46  HGB 14.7 12.9* 12.9*  HCT 47.2 41.0 40.8  MCV 93.1 91.3 91.5  MCH 29.0 28.7 28.9  MCHC 31.1 31.5 31.6  RDW 13.7 13.6 13.6  PLT 238 228 242    Cardiac Enzymes Recent Labs  Lab 02/20/18 2331 02/21/18 0536 02/21/18 1132 02/21/18 1829  TROPONINI >65.00* >65.00* >65.00* >65.00*   No results for input(s): TROPIPOC in the last 168 hours.   BNP Recent Labs  Lab 02/20/18 2331  BNP 14.9     DDimer No results for input(s): DDIMER in the last 168 hours.   Radiology    No results found.  Cardiac Studies   Left Heart Catheterization 02/20/2018: Findings:  Anterior ST elevation myocardial infarction due to  proximal occlusion of the LAD.  Successful PCI and stent reducing 100% stenosis with 0 TIMI flow to 0% with TIMI grade III flow using a 28 x 3.0 mm Synergy drug-eluting stent.  Postdilatation was not performed.  Deployment pressure 12 atm.  Mild distal left main disease, 20%  95% ostial proximal small to moderate size ramus intermedius.  Moderate to moderately severe circumflex disease with ostial 70 to 80% stenosis, dominant obtuse marginal with ostial 70% stenosis, and proximal 50 to 60% circumflex.  RCA is dominant with eccentric 70% stenosis.  Anterior and anteroapical akinesis with EF 35%.  LVEDP (24 mmHg)  is elevated and consistent with acute systolic heart failure.  Recommendations:  Guideline directed therapy for acute ischemic systolic heart failure: Beta-blocker, ARB therapy.  Diuretic therapy as needed if congestion.  Aggressive secondary risk factor modification using high intensity statin therapy and encouraging the patient to discontinue smoking.  Target blood pressure 130/80 mmHg or less.  Closely review the patient's medical regimen for Crohn's / systemic vasculitis/hypereosinophilic syndrome and be certain to use all important medications.  We will continue prednisone at 20 mg/day but watch for evidence of hypo-adrenal ism and potentially consider a dose or 2 of stress level glucocorticoid therapy if needed.  Cangrelor will be discontinued 2 hours after the Brilinta slurry was swallowed.  Moderate to moderately severe residual coronary disease in the circumflex, ramus, and RCA will be managed with medical therapy.  Recommend uninterrupted dual antiplatelet therapy with Aspirin 9m daily and Ticagrelor 965mtwice daily for a minimum of 12 months. _______________  Coronary Angiography 02/21/2018:  Anatomy is stable when compared to immediate post PCI results.  There is TIMI grade III flow in the LAD and circumflex.  The proximal LAD stent is widely patent and as  before there is proximal plaque obstructing the vessel up to 40%.  Recommendations:  We will give 40 mg of prednisone orally today which is twice his typical dose.  Start colchicine 0.6 mg twice daily.  2D Doppler echocardiogram is already been ordered.  We will give a stronger analgesic and also provide benzodiazepine therapy as the patient is very concerned that he cannot sleep.  If the patient develops progression in the disease proximal to the stented segment, coronary artery bypass grafting  would be his best treatment option.  Echocardiogram 02/22/2018: Study Conclusions: - Left ventricle: The cavity size was normal. Wall thickness was   normal. Systolic function was moderately reduced. The estimated   ejection fraction was in the range of 35% to 40%. Doppler   parameters are consistent with abnormal left ventricular   relaxation (grade 1 diastolic dysfunction). Spontaneous echo   contrast, sludge, and probable apical echo density in the left   ventricle with concern for LV apical thrombus. - Regional wall motion abnormality: Akinesis of the apical   anterior, mid anteroseptal, mid inferoseptal, apical inferior,   apical septal, and apical myocardium; severe hypokinesis of the   mid anterior myocardium; mild hypokinesis of the basal   anteroseptal myocardium. - Aortic valve: Transvalvular velocity was within the normal range.   There was no stenosis. There was no regurgitation. - Mitral valve: Transvalvular velocity was within the normal range.   There was no evidence for stenosis. There was no regurgitation. - Left atrium: The atrium was normal in size. - Right ventricle: The cavity size was normal. Wall thickness was   normal. Systolic function was normal. - Right atrium: The atrium was normal in size. Central venous   pressure (est): 3 mm Hg. - Tricuspid valve: Transvalvular velocity was within the normal   range. There was trivial regurgitation. - Inferior vena cava:  The vessel was normal in size. The   respirophasic diameter changes were in the normal range (= 50%),   consistent with normal central venous pressure. - Pericardium, extracardiac: There was no pericardial effusion.  Impressions: - Spontaneous echo contrast, sludge, and probable apical echo   density in the left ventricle suggest LV apical thrombus  Patient Profile   Mr. Newey is a 52 year old male with Crohn's disease, eosinophilic esophagitis, vasculitis on chronic prednisone, and essential hypertension who presented on 02/20/2018 with a 5-hour history of chest pain, nausea, diaphoresis after takingCialisand engaging in intercourse. Initial EKG showed hyperacute T waves and ST elevation V4-V6.Patient underwent emergent cardiac catheterization on 02/20/2018 showed proximal occlusion of LAD. DES placed in ostial LAD to proximal LAD lesion. Patient continued to complain of chest pain and repeat EKG on 02/21/2018 showed ST elevations in V1 and V2. Repeat catheterization showed stable stent in proximal LAD that was widely patent. Patient started on colchicine for possible pericarditis.   Assessment & Plan    Anterior STEMI - Left heart catheterization showed proximal occlusion of LAD s/p PCI with DES to ostial LAD to proximal LAD lesion.  - Continue dual antiplatelet therapy with Aspirin 29m daily and Plavix 758mdaily. (Patient was initially started on Brilinta but this was changed to Plavix with the addition of Warfarin for left ventricular thrombus.) - Continue statin, beta-blocker, and ARB. - Waiting on LifeVest.  - Dr. VaIrish Lackiscussed patient with Dr. SmTamala Julian would not plan for another PCI during this hospitalization.  Post MI Pericarditis - EKG on 02/22/2018 showed diffuse ST elevations in all leads. - Continue colchicine 0.61m47mwice daily. - Continue Aspirin 68m74mily. - Continue Hydroxyzine 100mg83mhtly. - OK to continue Ativan 0.5mg e38my 6 hours as needed.  Ischemic  Cardiomyopathy with Left Ventricular Thrombus - TEE on 02/22/2018 showed LVEF of 35-40% with akinesis of apical anterior septal and mild hypokinesis of basal anteroseptal myocardium as well as grade 1 diastolic dysfunction. Left ventricular apical thrombus was also found.  - Patient was initially started on Heparin with bridge to Warfarin and Plavix load on 02/22/2018.  -  INR 1.61 today. Continue Heparin with bridge to Warfarin per pharmacy.   Hypertension - BP continues to be soft and stable with systolic BP in the upper 92'B to 110's. Most recent BP 97/61. - Continue Carvedilol 3.124m twice daily. - Continue Losartan 266mdaily.  - Will continue to closely monitor.  Vasculitis  - Continue home Prednisone 2062maily.   Chron's Disease - Patient is supposed to be taking azathioprine and taper down prednisone per GI and Rheum recommendations, but the patient has not been able to tolerate azathioprine and therefore has not taken it in the past few months.  - Continue Prednisone as above. - Continue to follow with Rheumatology and GI outpatient   For questions or updates, please contact [facility] consult [email or web address] for contact info under        Signed, CalDarreld McleanA-C  02/24/2018, 9:56 AM

## 2018-02-24 NOTE — Progress Notes (Addendum)
[place] for IV heparin/Coumadin Indication: LV apical thrombus  Allergies  Allergen Reactions  . Cephalexin Nausea And Vomiting    Other Reaction: GI UPSET  . Duloxetine Other (See Comments)    Headaches   . Aspirin Hives and Rash    Patient Measurements: Height: 5' 7"  (170.2 cm) Weight: 167 lb 15.9 oz (76.2 kg) IBW/kg (Calculated) : 66.1 Heparin Dosing Weight: 75 kg  Vital Signs: Temp: 97.6 F (36.4 C) (11/27 0411) Temp Source: Oral (11/27 0411) BP: 97/61 (11/27 0411)  Labs: Recent Labs    02/21/18 1132 02/21/18 1829  02/22/18 0340  02/23/18 0007 02/23/18 0550 02/24/18 0408  HGB  --   --    < > 14.7  --   --  12.9* 12.9*  HCT  --   --   --  47.2  --   --  41.0 40.8  PLT  --   --   --  238  --   --  228 242  LABPROT  --   --   --   --   --   --  14.2 19.0*  INR  --   --   --   --   --   --  1.11 1.61  HEPARINUNFRC  --   --   --   --    < > 0.51 0.46 0.30  CREATININE  --   --   --  1.07  --   --   --   --   TROPONINI >65.00* >65.00*  --   --   --   --   --   --    < > = values in this interval not displayed.    Estimated Creatinine Clearance: 77.2 mL/min (by C-G formula based on SCr of 1.07 mg/dL).   Medical History: Past Medical History:  Diagnosis Date  . BPH (benign prostatic hypertrophy)   . Colitis   . Colon polyp    inflammatory  . Crohn disease ([place]) 1992   history uveitis, involvement of intestines and lungs  . Diverticulosis   . Eosinophilic esophagitis   . Essential hypertension   . GERD (gastroesophageal reflux disease)   . History of chicken pox   . History of gastroesophageal reflux (GERD)   . History of seizure 1995   grand mal x1, completed 6 yrs dilantin. no seizures since  . Internal hemorrhoids   . Osteoporosis 11/2015   DEXA T -2.9  . Schwannoma 2007   L axilla s/p surgery  . Seizures ([place])    last seizure 1995 and only x 1 seizure-   . Ulnar neuropathy    h/o this from L arm  schwannoma    Medications:  Infusions:  . sodium chloride Stopped (02/21/18 1940)  . sodium chloride Stopped (02/21/18 1940)  . heparin 1,100 Units/hr (02/23/18 1100)    Assessment: 52 yo male admitted with chest pain s/p PCI.  Likely with post-MI percarditis, ECHO revealed clot in apex.  Heparin level 0.3 at lower end of goal this AM, INR rising at 1.6.  No signs/symptoms of bleeding or complications noted.  Today will be Day #3/5 day minimum IV/po anticoagulation overlap.  Goal of Therapy:  Heparin level 0.3-0.7 units/ml Monitor platelets by anticoagulation protocol: Yes   Plan:  1. Increase IV heparin to 1150 units/hr 2. Coumadin 7.5 mg x 1 tonight. 3. Daily heparin level, CBC, and INR 4. At discharge consider warfarin 30m daily and bridge  with enoxaparin 12m daily (1.543mkg) until INR therapeutic.    AnAzzie Roup PGY1 Pharmacy Resident  Phone ([phone] use AMION for clinical pharmacists numbers  02/24/2018      7:33 AM

## 2018-02-24 NOTE — Progress Notes (Signed)
Pt refused walk. Stated that he had already walked and was tired from being kept up all night.

## 2018-02-24 NOTE — Telephone Encounter (Signed)
New message    TOC f/u appt made per Vin Bhagat on 12.5.19 at 9am with Melina Copa.

## 2018-02-24 NOTE — Discharge Summary (Addendum)
Discharge Summary    Patient ID: MEMPHIS DECOTEAU MRN: [id number]; DOB: 1965/07/14  Admit date: 02/20/2018 Discharge date: 02/24/2018  Primary Care Provider: [name], MD  Primary Cardiologist: Sinclair Grooms, MD  Primary Electrophysiologist:  None   Discharge Diagnoses    Principal Problem:   Acute ST elevation myocardial infarction (STEMI) involving left anterior descending (LAD) coronary artery [facility]) Active Problems:   Crohn disease ([place])   Essential hypertension   Vasculitis ([place])   LV (left ventricular) mural thrombus following MI ([place])   Warfarin anticoagulation   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 Cephalexin Nausea And Vomiting    Other Reaction: GI UPSET  . Duloxetine Other (See Comments)    Headaches   . Aspirin Hives and Rash    Diagnostic Studies/Procedures    Left Heart Catheterization 02/10/2018:  Anterior ST elevation myocardial infarction due to proximal occlusion of the LAD.  Successful PCI and stent reducing 100% stenosis with 0 TIMI flow to 0% with TIMI grade III flow using a 28 x 3.0 mm Synergy drug-eluting stent.  Postdilatation was not performed.  Deployment pressure 12 atm.  Mild distal left main disease, 20%  95% ostial proximal small to moderate size ramus intermedius.  Moderate to moderately severe circumflex disease with ostial 70 to 80% stenosis, dominant obtuse marginal with ostial 70% stenosis, and proximal 50 to 60% circumflex.  RCA is dominant with eccentric 70% stenosis.  Anterior and anteroapical akinesis with EF 35%.  LVEDP (24 mmHg)  is elevated and consistent with acute systolic heart failure.  Recommendations:  Guideline directed therapy for acute ischemic systolic heart failure: Beta-blocker, ARB therapy.  Diuretic therapy as needed if congestion.  Aggressive secondary risk factor modification using high intensity statin therapy and encouraging the  patient to discontinue smoking.  Target blood pressure 130/80 mmHg or less.  Closely review the patient's medical regimen for Crohn's / systemic vasculitis/hypereosinophilic syndrome and be certain to use all important medications.  We will continue prednisone at 20 mg/day but watch for evidence of hypo-adrenal ism and potentially consider a dose or 2 of stress level glucocorticoid therapy if needed.  Cangrelor will be discontinued 2 hours after the Brilinta slurry was swallowed.  Moderate to moderately severe residual coronary disease in the circumflex, ramus, and RCA will be managed with medical therapy. _____________  Coronary Angiography 02/21/2018:  Anatomy is stable when compared to immediate post PCI results.  There is TIMI grade III flow in the LAD and circumflex.  The proximal LAD stent is widely patent and as before there is proximal plaque obstructing the vessel up to 40%.  Recommendations:  We will give 40 mg of prednisone orally today which is twice his typical dose.  Start colchicine 0.6 mg twice daily.  2D Doppler echocardiogram is already been ordered.  We will give a stronger analgesic and also provide benzodiazepine therapy as the patient is very concerned that he cannot sleep.  If the patient develops progression in the disease proximal to the stented segment, coronary artery bypass grafting would be his best treatment option. _______________  Echocardiogram 02/22/2018: Study Conclusions: - Left ventricle: The cavity size was normal. Wall thickness was   normal. Systolic function was moderately reduced. The estimated   ejection fraction was in the range of 35% to 40%. Doppler   parameters are consistent with abnormal left ventricular   relaxation (grade 1 diastolic dysfunction). Spontaneous echo   contrast, sludge, and probable  apical echo density in the left   ventricle with concern for LV apical thrombus. - Regional wall motion abnormality: Akinesis of the  apical   anterior, mid anteroseptal, mid inferoseptal, apical inferior,   apical septal, and apical myocardium; severe hypokinesis of the   mid anterior myocardium; mild hypokinesis of the basal   anteroseptal myocardium. - Aortic valve: Transvalvular velocity was within the normal range.   There was no stenosis. There was no regurgitation. - Mitral valve: Transvalvular velocity was within the normal range.   There was no evidence for stenosis. There was no regurgitation. - Left atrium: The atrium was normal in size. - Right ventricle: The cavity size was normal. Wall thickness was   normal. Systolic function was normal. - Right atrium: The atrium was normal in size. Central venous   pressure (est): 3 mm Hg. - Tricuspid valve: Transvalvular velocity was within the normal   range. There was trivial regurgitation. - Inferior vena cava: The vessel was normal in size. The   respirophasic diameter changes were in the normal range (= 50%),   consistent with normal central venous pressure. - Pericardium, extracardiac: There was no pericardial effusion.  Impressions: - Spontaneous echo contrast, sludge, and probable apical echo   density in the left ventricle suggest LV apical thrombus.   History of Present Illness     Jack [name] is a 52 year old male with history of systemic vasculitis, eosinophilic esophagitis, Crohn's disease, and essential hypertension who presented to [facility] ED 02/20/2018 for evaluation of chest pain. Patient reports he developed severe crushing chest discomfort with associated nausea and diaphoresis around 7:40pm. The pain started about 5 hours after he took Cialis and while he was engaged in intercourse. Patient called 911. EMS transmitted EKG which demonstrated evidence of anterior STEMI in leads V4-V6. He received IV heparin and Fentanyl for pain control in the ED. Patient was taken to the cath lab for emergent cardiac catheterization with reperfusion  therapy.   Hospital Course     Consultants: None  Anterior STEMI Mr. Santa was admitted on 02/20/2018 for anterior STEMI has stated above. Troponin peaked at >65.00. Patient underwent emergent cardiac catheterization which showed proximal occlusion of LAD. He underwent successful PCI with DES to ostial LAD to proximal LAD lesion. Patient was started on dual antiplatelet therapy with Aspirin and Brilinta. Patient continued to complain of chest pain on 02/21/2018. Repeat EKG showed increased ST elevation in V1 and V2. Re-look catheterization on 02/21/2018 showed stable anatomy with patent stent in LAD. Treatment for suspected pericarditis was recommended. 2-D Echocardiogram on 02/22/2018 showed LVEF of 35-40% with wall motion abnormalities and grade 1 diastolic dysfunction as well as a left ventricular apical thrombus. Patient was started on Heparin with bridge to Warfarin. Brilinta was discontinued and patient was transitioned to Plavix with loading dose of 622m. Patient has residual RCA disease. Dr. VIrish Lackdiscussed patient with Dr. STamala Julianyesterday (02/24/2018) and the decision was that they would not plan on another PCI during this hospitalization. - Continue dual antiplatelet therapy with Aspirin 824mdaily and Plavix 7535maily.  - Continue statin, beta-blocker, and ARB. - Patient being discharged with LifeVest.  - Will likely need repeat echocardiogram to reassess LV function and LV thrombus after a few months of evidence-based medical therapy.  Post MI Pericarditis - Continue Colchicine 0.6mg48mice daily for 3 months. - Continue Aspirin 81mg34mly. - Continue Hydroxyzine 100mg 13mtly.  Ischemic Cardiomyopathy with Left Ventricular Thrombus - INR 1.61 today.  -  Patient being discharged with Lovenox bridge. Discussed dosing of Lovenox and Coumadin with pharmacy. Patient instructed to take 7.20m of Coumadin today and 536mtomorrow. Patient will receive one dose of Lovenox 12086moday  prior to discharge and was instructed to take another dose of 120m51matient will go to [facility] on Friday 02/26/2018 to have INR checked. Results will be given to on-call provider, who will then call patient with any medication adjustments. [facility].   Hypertension - Continue Carvedilol 3.125mg52mce daily. - Continue Losartan 25mg 60my.  - Discontinue home Amlodipine.   Hyperlipidemia - LDL 166 this admission. - Continue Lipitor 80mg d6m. - Will need to recheck lipid panel and CMP in about 6 weeks.  Vasculitis  - Continue home Prednisone.   Chron's Disease - Patient is supposed to be taking azathioprine and taper down prednisone per GI and Rheumatology recommendations, but the patient has not been able to tolerate azathioprine and therefore has not taken it in the past few months.  - Continue home Prednisone.  - Continue to follow with Rheumatology and GI outpatient  Right Leg Pain - Patient was complaining of severe right leg pain today due to "sciatica." He has a spine stimulator that is off. [facility]. He has a follow-up with Sports Medicine on Monday. - Per Dr. VaranasIrish Lackprescribe Oxycodone 5mg to 53me as needed every 6 hours for moderate/severe pain. Patient given 10 tablets with no refills in order to carry him over to his Sports Medicine appointment. Patient aware that we will not be prescribing any additional pain medications for this in the future.   Patient was seen and examined by Dr. VaranasiIrish Lack today and was determined to be stable for discharge. Transition of Care appointment has been arranged for 03/04/2018. Medications as below.   Discharge Vitals Blood pressure 97/61, pulse 78, temperature 97.6 F (36.4 C), temperature source Oral, resp. rate 15, height _0  (1.702 m), weight 76.2 kg, SpO2 100 %.  Filed Weights   02/21/18 0500  02/22/18 0600 02/23/18 0500  Weight: 77.4 kg 75.1 kg 76.2 kg    Labs & Radiologic Studies    CBC Recent Labs    02/23/18 0550 02/24/18 0408  WBC 14.1* 13.2*  HGB 12.9* 12.9*  HCT 41.0 40.8  MCV 91.3 91.5  PLT 228 242   Ba854 Metabolic Panel Recent Labs    02/22/18 0340  NA 135  K 5.1  CL 107  CO2 21*  GLUCOSE 126*  BUN 13  CREATININE 1.07  CALCIUM 8.8*   Liver Function Tests No results for input(s): AST, ALT, ALKPHOS, BILITOT, PROT, ALBUMIN in the last 72 hours. No results for input(s): LIPASE, AMYLASE in the last 72 hours. Cardiac Enzymes Recent Labs    02/21/18 1829  TROPONINI >65.00*   BNP Invalid input(s): POCBNP D-Dimer No results for input(s): DDIMER in the last 72 hours. Hemoglobin A1C No results for input(s): HGBA1C in the last 72 hours. Fasting Lipid Panel No results for input(s): CHOL, HDL, LDLCALC, TRIG, CHOLHDL, LDLDIRECT in the last 72 hours. Thyroid Function Tests No results for input(s): TSH, T4TOTAL, T3FREE, THYROIDAB in the last 72 hours.  Invalid input(s): FREET3 _____________  Dg Chest Port 1 View  Result Date: 02/21/2018 CLINICAL DATA:  Myocardial infarction. EXAM: PORTABLE CHEST 1 VIEW COMPARISON:  07/01/2016 FINDINGS: Upper normal heart size. Lungs appear clear.  No pleural effusion. Dorsal column stimulator projects over the upper lumbar region. IMPRESSION: No active disease. Electronically Signed   By: Van Clines M.D.   On: 02/21/2018 00:14   Disposition   Patient is being discharged home today in good condition.  Follow-up Plans & Appointments    Follow-up Information    Charlie Pitter, PA-C. Go on 03/04/2018.   Specialties:  Cardiology, Radiology Why:  _0  for hospital follow up with Dr. Thompson Caul PA Contact information: [address] [place] [address] [phone]        [place]. Go on 03/01/2018.   Specialty:  Cardiology Why:  _1 :30am for INR check Contact  information: [address] [employer] [place] on 02/26/2018.   Why:  Go to [facility] _2  for INR check oncall provider will call with further direction  [address], [place],  [address]         Discharge Instructions    Amb Referral to Cardiac Rehabilitation   Complete by:  As directed    Referring to [place] [facility] 2 also   Diagnosis:   STEMI Coronary Stents     Amb Referral to Cardiac Rehabilitation   Complete by:  As directed    Referring to [place] [facility] 2 also   Diagnosis:   STEMI Coronary Stents     Diet - low sodium heart healthy   Complete by:  As directed    Increase activity slowly   Complete by:  As directed       Discharge Medications   Allergies as of 02/24/2018      Reactions   Cephalexin Nausea And Vomiting   Other Reaction: GI UPSET   Duloxetine Other (See Comments)   Headaches   Aspirin Hives, Rash      Medication List    STOP taking these medications   amLODipine 5 MG tablet Commonly known as:  NORVASC   cyclobenzaprine 10 MG tablet Commonly known as:  FLEXERIL   dicyclomine 10 MG capsule Commonly known as:  BENTYL     TAKE these medications   acetaminophen 325 MG tablet Commonly known as:  TYLENOL Take 650 mg by mouth every 6 (six) hours as needed for mild pain or headache.   alendronate 70 MG tablet Commonly known as:  FOSAMAX Take 70 mg by mouth every Saturday.   aspirin 81 MG EC tablet Take 1 tablet (81 mg total) by mouth daily. Start taking on:  02/25/2018   atorvastatin 80 MG tablet Commonly known as:  LIPITOR Take 1 tablet (80 mg total) by mouth daily at 6 PM.   CALCIUM 1200 PO Take 1 capsule by mouth 2 (two) times daily.   carvedilol 3.125 MG tablet Commonly known as:  COREG Take 1 tablet (3.125 mg total) by mouth 2 (two) times daily with a meal.   cetirizine 10 MG tablet Commonly known as:  ZYRTEC Take 10 mg by mouth daily.   clopidogrel 75 MG  tablet Commonly known as:  PLAVIX Take 1 tablet (75 mg total) by mouth daily.   colchicine 0.6 MG tablet Take 1 tablet (0.6 mg total) by mouth 2 (two) times daily.   diphenoxylate-atropine 2.5-0.025 MG tablet Commonly known as:  LOMOTIL Take 1-2 tablets by mouth four times daily as needed What changed:    how much to take  how to take this  when to take this  reasons  to take this  additional instructions   enoxaparin 120 MG/0.8ML injection Commonly known as:  LOVENOX Inject 0.8 mLs (120 mg total) into the skin daily.   ENTYVIO 300 MG injection Generic drug:  vedolizumab Inject 300 mg into the vein every 8 (eight) weeks.   furosemide 40 MG tablet Commonly known as:  LASIX Take 1 tablet (40 mg total) by mouth daily as needed for fluid or edema.   gabapentin 300 MG capsule Commonly known as:  NEURONTIN Take 1 capsule (300 mg total) by mouth at bedtime as needed.   hydrOXYzine 25 MG tablet Commonly known as:  ATARAX/VISTARIL TAKE 4 TABLETS (100 MG TOTAL) BY MOUTH AT BEDTIME.   losartan 25 MG tablet Commonly known as:  COZAAR Take 1 tablet (25 mg total) by mouth daily. Start taking on:  02/25/2018   multivitamin capsule Take 1 capsule by mouth daily.   nitroGLYCERIN 0.4 MG SL tablet Commonly known as:  NITROSTAT Place 1 tablet (0.4 mg total) under the tongue every 5 (five) minutes as needed for chest pain.   oxyCODONE 5 MG immediate release tablet Commonly known as:  Oxy IR/ROXICODONE Take 1 tablet (5 mg total) by mouth every 6 (six) hours as needed for up to 10 doses for moderate pain or severe pain.   pantoprazole 40 MG tablet Commonly known as:  PROTONIX Take 1 tablet (40 mg total) by mouth daily.   predniSONE 1 MG tablet Commonly known as:  DELTASONE Take 9 mg by mouth See admin instructions. Take 62m with (1) 117mtablet to equal 1931my mouth once daily What changed:  Another medication with the same name was changed. Make sure you understand how and  when to take each.   predniSONE 10 MG tablet Commonly known as:  DELTASONE Take 2 tablets (20 mg total) by mouth daily with breakfast. What changed:    how much to take  when to take this  additional instructions   tamsulosin 0.4 MG Caps capsule Commonly known as:  FLOMAX TAKE 1 CAPSULE (0.4 MG TOTAL) BY MOUTH DAILY.   warfarin 5 MG tablet Commonly known as:  COUMADIN Take 1.5 tablet tonight (02/24/2018) and 1 tablet tomorrow (02/25/2018). Will recheck INR on Friday (02/25/2018).        Acute coronary syndrome (MI, NSTEMI, STEMI, etc) this admission?: Yes.     AHA/ACC Clinical Performance & Quality Measures: 1. Aspirin prescribed? - Yes 2. ADP Receptor Inhibitor (Plavix/Clopidogrel, Brilinta/Ticagrelor or Effient/Prasugrel) prescribed (includes medically managed patients)? - Yes 3. Beta Blocker prescribed? - Yes 4. High Intensity Statin (Lipitor 40-31m16m Crestor 20-40mg59mescribed? - Yes 5. EF assessed during THIS hospitalization? - Yes 6. For EF <40%, was ACEI/ARB prescribed? - Yes 7. For EF <40%, Aldosterone Antagonist (Spironolactone or Eplerenone) prescribed? - No, may add as outpatient.  8. Cardiac Rehab Phase II ordered (Included Medically managed Patients)? - Yes     Outstanding Labs/Studies   Will need INR rechecked. Appointments have been made.   Will need repeat lipid panel and CMP in about 6 weeks after starting a statin.   Duration of Discharge Encounter   Greater than 30 minutes including physician time.  Signed, CalliDarreld McleanC 02/24/2018, 3:45 PM  I have examined the patient and reviewed assessment and plan and discussed with patient.  Agree with above as stated. S/p LAD stent for anterior MI. Doing well from a cardiac standpoint.  Appears euvolemic.  COntinue aggressive secondary prevention.    Prednisone for Crohn's.  Resume home  dose.  Not using NSAIDs for post-MI pericarditis given bleeding risk, ad the fact that he was already on  prednisone at baseline.   COlchicine for a 3 months.  Left ventricular thrombus.  Target INR 2.0. Lovenox bridge until that time. Aspirin and PLavix as well for the stent. Recheck INR results Friday.  Results will be called to me.  Will adjust dose between 5 mg and 7.5 mg perday.  Recheck in COumadin clinic on Monday  Spoke about right leg pain.  She has a spine stimulator that is off.  He saw Dr. Jinny Sanders at [facility] regarding management in the past.  He has f/u with SPort medicine on Monday.  Will plan to continue f/u with sports medicine clinic as an outpatient.  Pain meds ordered at this time. Feels better when he walks.  I encouraged him to ambulate in the halls and sit in the chair.   He is comfortable giving himself injections and prefers to go home.   GOing home with life vest due to low EF following anterior MI.  F/u with Dr. Arminda Resides

## 2018-02-25 DIAGNOSIS — I509 Heart failure, unspecified: Secondary | ICD-10-CM | POA: Diagnosis not present

## 2018-02-25 DIAGNOSIS — I2109 ST elevation (STEMI) myocardial infarction involving other coronary artery of anterior wall: Secondary | ICD-10-CM | POA: Diagnosis not present

## 2018-02-26 ENCOUNTER — Other Ambulatory Visit: Payer: Self-pay | Admitting: Physician Assistant

## 2018-02-26 DIAGNOSIS — I236 Thrombosis of atrium, auricular appendage, and ventricle as current complications following acute myocardial infarction: Secondary | ICD-10-CM | POA: Diagnosis not present

## 2018-02-26 DIAGNOSIS — Z7901 Long term (current) use of anticoagulants: Secondary | ICD-10-CM | POA: Diagnosis not present

## 2018-02-26 NOTE — Telephone Encounter (Signed)
   Coumadin for intracardiac thrombus post MI.  Called by [facility].  INR was 3.3; PT 30.7 on 11/29.  Patient instructed to stop Lovenox injections.    He had been treated with alternating doses of 7.5 mg and 5 mg in the hospital.  COntinue Coumadin 5 mg daily on Fri, Sat, and Sunday.  He will come to the office on Monday for INR check.   No bleeding problems.  Still just feeling fatigued.  Jettie Booze, MD

## 2018-03-01 ENCOUNTER — Ambulatory Visit (INDEPENDENT_AMBULATORY_CARE_PROVIDER_SITE_OTHER): Payer: BLUE CROSS/BLUE SHIELD | Admitting: *Deleted

## 2018-03-01 DIAGNOSIS — Z7901 Long term (current) use of anticoagulants: Secondary | ICD-10-CM

## 2018-03-01 DIAGNOSIS — I236 Thrombosis of atrium, auricular appendage, and ventricle as current complications following acute myocardial infarction: Secondary | ICD-10-CM

## 2018-03-01 DIAGNOSIS — Z5181 Encounter for therapeutic drug level monitoring: Secondary | ICD-10-CM | POA: Insufficient documentation

## 2018-03-01 LAB — POCT INR: INR: 2.7 (ref 2.0–3.0)

## 2018-03-01 NOTE — Telephone Encounter (Signed)
Noted - pt scheduled for follow up INR check today.

## 2018-03-01 NOTE — Patient Instructions (Addendum)
A full discussion of the nature of anticoagulants has been carried out.  A benefit risk analysis has been presented to the patient, so that they understand the justification for choosing anticoagulation at this time. The need for frequent and regular monitoring, precise dosage adjustment and compliance is stressed.  Side effects of potential bleeding are discussed.  The patient should avoid any OTC items containing aspirin or ibuprofen, and should avoid great swings in general diet.  Avoid alcohol consumption.  Call if any signs of abnormal bleeding.   Description   Continue taking 1 tablet daily. Recheck INR in 1 week. Coumadin Clinic [phone] Main [phone]

## 2018-03-02 ENCOUNTER — Encounter: Payer: Self-pay | Admitting: Sports Medicine

## 2018-03-02 ENCOUNTER — Ambulatory Visit: Payer: BLUE CROSS/BLUE SHIELD | Admitting: Sports Medicine

## 2018-03-02 ENCOUNTER — Other Ambulatory Visit: Payer: Self-pay | Admitting: Family Medicine

## 2018-03-02 VITALS — BP 123/81 | Ht 67.0 in | Wt 167.0 lb

## 2018-03-02 DIAGNOSIS — M5416 Radiculopathy, lumbar region: Secondary | ICD-10-CM

## 2018-03-02 MED ORDER — GABAPENTIN 300 MG PO CAPS: ORAL_CAPSULE | ORAL | 1 refills | Status: DC

## 2018-03-02 NOTE — Patient Instructions (Signed)
Jack [name], from [place], will be calling you to set up your epidural steroid injection for your back. Her number is [phone] if you have any questions.

## 2018-03-03 ENCOUNTER — Other Ambulatory Visit: Payer: Self-pay | Admitting: Sports Medicine

## 2018-03-03 ENCOUNTER — Encounter: Payer: Self-pay | Admitting: Sports Medicine

## 2018-03-03 DIAGNOSIS — M5416 Radiculopathy, lumbar region: Secondary | ICD-10-CM

## 2018-03-03 LAB — PT AND PTT
INR: 3.3
Prothrombin Time: 30.7 s
aPTT: 45 s

## 2018-03-03 NOTE — Progress Notes (Signed)
   Subjective:    Patient ID: Jack [name], male    DOB: [birth year], 52 y.o.   MRN: [id number]  HPI   Jack [name] comes in today with worsening right calf pain.  Recent CT scan of his lumbar spine showed postoperative changes at L5-S1 with a 19 mm x 25 mm pseudomeningocele.  Although he did not follow-up with his surgeon, he did send the CT scan for him to review.  Per the patient's report, the surgeon did not see any definitive surgical pathology.  Patient continues to endorse intermittent but severe right calf pain.  He does notice the pain to be reproducible at times with bending forward.  Pain is quite severe at night.  He has had several nights where he had difficulty sleeping.  He denies pain elsewhere in the leg.  Only in the calf.  He describes it as a severe cramping type of feeling.  He is here today to discuss possible treatment options.  He is here today with his wife.  Interim medical history reviewed.  It is significant for recent myocardial infarction.  He is currently under the care of cardiology for this.   Review of Systems As above    Objective:   Physical Exam  Well-developed, well-nourished.  No acute distress.  Awake alert and oriented x3.  Vital signs reviewed.  Patient sits comfortably in the exam room  Examination of the right lower leg shows a negative straight leg raise.  No tenderness to palpation in the calf.  No atrophy.  Good strength.  Brisk and equal reflexes at the Achilles tendons bilaterally.  CT scan as above      Assessment & Plan:   Right calf pain Status post L5-S1 laminectomy  Patient's symptoms sound neuropathic in nature.  I discussed work-up and treatment possibilities including diagnostic lumbar ESI, EMG/nerve conduction studies, and referral locally to a neurosurgeon for a second opinion.  Patient and his wife would like to proceed with the diagnostic lumbar ESI and would like a second opinion if it could be arranged.  He has also been on high  doses of gabapentin in the past with some success.  He is currently taking 300 mg at night.  We will increase this over the next week or so to 300 mg 3 times daily.  Patient will follow-up with me again in 4 weeks for reevaluation.  He does have a history of postoperative chronic pain in the left leg which required a spinal stimulator to be placed.  Referral to a chronic pain management clinic may be an option for him as well.  Patient's wife is inquiring about narcotic prescriptions but I explained to both her and her husband that they would need to get these from his PCP. They understand.

## 2018-03-03 NOTE — Progress Notes (Addendum)
Cardiology Office Note    Date:  03/04/2018  ID:  GRECO GASTELUM, DOB 1965/05/16, MRN [id number] PCP:  [name], MD  Cardiologist:  Sinclair Grooms, MD   Chief Complaint: f/u MI (complex admission)  History of Present Illness:  Jack [name] is a 52 y.o. male with history of systemic vasculitis, eosinophilic esophagitis, Crohn's disease, essential hypertension, sciatica and recent complex admission for CAD/anterior STEMI with acute systolic CHF/ischemic cardiomyopathy, LV thrombus, post-MI pericarditis who presents for follow-up. He was admitted 02/20/18 with severe crushing chest discomfort with associated nausea and diaphoresis. The pain started about 5 hours after he took Cialis and while he was engaged in intercourse. In retrospect the wife now relays an episode of extreme leg pain the day prior followed by intense SOB for a period of time. EMS EKG demonstrated anterior STEMI. Emergent cardiac catheterization which showed proximal occlusion of LAD, treated with DES. Cath also showed 20% distal LM, 95% ostial-prox small-moderate ramus, 70-80% ostial Cx, 70% dominant ostial OM, 50-60% prox Cx, 70% RCA. EF 35% by cath with LVEDP 72mHg. Medical therapy was recommended for his residual disease. He did go back for relook catheterization on 02/21/2018 due to recurrent CP which showed stable anatomy with patent stent in LAD. Treatment for suspected pericarditis was recommended. He was sarted on colchicine 0.691mBID x 3 months. He was initially started on dual antiplatelet therapy with Aspirin and Brilinta but this was changed to Plavix after he was found to have LV thrombus requiring heparin->Coumadin bridge. His 2D echo 02/22/18 had shown EF 35-40%, multiple wall motion abnormalities, normal RV, no pericardial effusion but probable LV apical thrombus - Dr. VaIrish Lackersonally reviewed the echo film and felt his EF was more likely 30-35%, therefore LifeVest was recommended. He was  discharged with Lovenox->Coumadin bridge. Otherwise labs showed perpetually elevated WBC in setting of steroid therapy, Hgb 12.9, K 2.8-5.1, Cr 1.07, LDL 166, A1C 5.2, BNP wnl, TSH wnl, LFTS OK.  He returns for follow-up with his wife. He is plagued by continued R leg pain (ongoing issue for many years), seeing sports medicine. This was not felt to be related to anything cardiac in the hospital. It is worse with rest and improved with walking. He had normal vascular testing end of October. He denies any CP, SOB, palpitations, orthopnea, syncope or Lifevest discharge. 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 [place] [facility] [place] He would like to have cardiologist's clearance to proceed with these things. He's not had any bleeding. Visit was extended given numerous issues to discuss.   Past Medical History:  Diagnosis Date  . BPH (benign prostatic hypertrophy)   . CAD (coronary artery disease)    a. anterior STEMI 01/2018 -  proximal occlusion of LAD, treated with DES. Cath also showed 20% distal LM, 95% ostial-prox small-moderate ramus, 70-80% ostial Cx, 70% dominant ostial OM, 50-60% prox Cx, 70% RCA. EF 35% by cath with LVEDP 2423m. Med rx for residual disease. Course complicated by post MI pericarditis and LV thrombus.  . Chronic systolic (congestive) heart failure ([facility] . Colitis   . Colon polyp    inflammatory  . Crohn disease ([facility]992   history uveitis, involvement of intestines and lungs  . Diverticulosis   . Eosinophilic esophagitis   . Essential hypertension   . GERD (gastroesophageal reflux disease)   . History of chicken  pox   . History of gastroesophageal reflux (GERD)   . History of seizure 1995   grand mal x1, completed 6 yrs dilantin. no seizures since  . Hyperlipidemia   . Internal hemorrhoids   . Ischemic cardiomyopathy   . LV (left ventricular) mural thrombus following MI ([place]) 01/2018    . Osteoporosis 11/2015   DEXA T -2.9  . Schwannoma 2007   L axilla s/p surgery  . Seizures ([place])    last seizure 1995 and only x 1 seizure-   . Ulnar neuropathy    h/o this from L arm schwannoma    Past Surgical History:  Procedure Laterality Date  . APPENDECTOMY  2000  . CARDIOVASCULAR STRESS TEST  01/2015   low risk study  . CHOLECYSTECTOMY  2000  . COLONOSCOPY  08/2014   f/u crohn's, 4 inflammatory polyps, diverticulosis, improved, rpt 2 yrs ([place])  . COLONOSCOPY  01/2015   during hospitalization - active ileitis/colitis  . CORONARY ANGIOGRAPHY N/A 02/21/2018   Procedure: CORONARY ANGIOGRAPHY;  Surgeon: Belva Crome, MD;  Location: [facility];  Service: Cardiovascular;  Laterality: N/A;  . CORONARY/GRAFT ACUTE MI REVASCULARIZATION N/A 02/20/2018   Procedure: Coronary/Graft Acute MI Revascularization;  Surgeon: Belva Crome, MD;  Location: [facility];  Service: Cardiovascular;  Laterality: N/A;  . ESOPHAGOGASTRODUODENOSCOPY  07/2014   h/o EE resolved, focal reflux esophagitis, chronic active gastritis  . extremity surgery Left    ulnar nerve repair after schwannoma removal  . INGUINAL HERNIA REPAIR Bilateral 2017  . LEFT HEART CATH AND CORONARY ANGIOGRAPHY N/A 02/20/2018   Procedure: LEFT HEART CATH AND CORONARY ANGIOGRAPHY;  Surgeon: Belva Crome, MD;  Location: [facility];  Service: Cardiovascular;  Laterality: N/A;  . LUMBAR SPINE SURGERY  2011  . POLYPECTOMY    . SPINAL CORD STIMULATOR IMPLANT  2012   x2 (Dr Berton Mount)  . TONSILLECTOMY  1996  . TUMOR REMOVAL Left 2007   schwannoma from L armpit  . UPPER GASTROINTESTINAL ENDOSCOPY      Current Medications: Current Meds  Medication Sig  . acetaminophen (TYLENOL) 325 MG tablet Take 650 mg by mouth every 6 (six) hours as needed for mild pain or headache.   . alendronate (FOSAMAX) 70 MG tablet Take 70 mg by mouth every Saturday.   [name] aspirin 81 MG EC tablet Take 1 tablet (81 mg total) by  mouth daily.  [name] atorvastatin (LIPITOR) 80 MG tablet Take 1 tablet (80 mg total) by mouth daily at 6 PM.  . Calcium Carbonate-Vit D-Min (CALCIUM 1200 PO) Take 1 capsule by mouth 2 (two) times daily.  . carvedilol (COREG) 3.125 MG tablet Take 1 tablet (3.125 mg total) by mouth 2 (two) times daily with a meal.  . cetirizine (ZYRTEC) 10 MG tablet Take 10 mg by mouth daily.   . clopidogrel (PLAVIX) 75 MG tablet Take 1 tablet (75 mg total) by mouth daily.  . colchicine 0.6 MG tablet Take 1 tablet (0.6 mg total) by mouth 2 (two) times daily.  . diphenoxylate-atropine (LOMOTIL) 2.5-0.025 MG tablet Take 1-2 tablets by mouth four times daily as needed (Patient taking differently: Take 1-2 tablets by mouth 4 (four) times daily as needed for diarrhea or loose stools. )  . furosemide (LASIX) 40 MG tablet Take 1 tablet (40 mg total) by mouth daily as needed for fluid or edema.  . gabapentin (NEURONTIN) 300 MG capsule Take 1 capsule (300 mg total) by mouth at bedtime as needed.  [name]  gabapentin (NEURONTIN) 300 MG capsule Take one tab QHS for 4 days, then one tab BID for 4 days, then one tab TID  . hydrOXYzine (ATARAX/VISTARIL) 25 MG tablet TAKE 4 TABLETS (100 MG TOTAL) BY MOUTH AT BEDTIME.  [name] losartan (COZAAR) 25 MG tablet Take 1 tablet (25 mg total) by mouth daily.  . Multiple Vitamin (MULTIVITAMIN) capsule Take 1 capsule by mouth daily.  . nitroGLYCERIN (NITROSTAT) 0.4 MG SL tablet Place 1 tablet (0.4 mg total) under the tongue every 5 (five) minutes as needed for chest pain.  . pantoprazole (PROTONIX) 40 MG tablet Take 1 tablet (40 mg total) by mouth daily.  . predniSONE (DELTASONE) 1 MG tablet Take by mouth See admin instructions. Take 109m with (1) 175mtablet to equal 1934my mouth once daily   . predniSONE (DELTASONE) 10 MG tablet Take 2 tablets (20 mg total) by mouth daily with breakfast. (Patient taking differently: Take 18 mg by mouth See admin instructions. Take 77m8mth (9) 1mg 36mlets to equal 19mg 73mmouth once daily)  . tamsulosin (FLOMAX) 0.4 MG CAPS capsule TAKE 1 CAPSULE (0.4 MG TOTAL) BY MOUTH DAILY.  . vedolizumab (ENTYVIO) 300 MG injection Inject 300 mg into the vein every 8 (eight) weeks.   . warfMarland Kitchenrin (COUMADIN) 5 MG tablet Take 1.5 tablet tonight (02/24/2018) and 1 tablet tomorrow (02/25/2018). Will recheck INR on Friday (02/25/2018).      Allergies:   Cephalexin; Duloxetine; and Aspirin   Social History   Socioeconomic History  . Marital status: Married    Spouse name: Not on file  . Number of children: Not on file  . Years of education: Not on file  . Highest education level: Not on file  Occupational History  . Not on file  Social Needs  . Financial resource strain: Not on file  . Food insecurity:    Worry: Not on file    Inability: Not on file  . Transportation needs:    Medical: Not on file    Non-medical: Not on file  Tobacco Use  . Smoking status: Current Some Day Smoker    Types: Cigars  . Smokeless tobacco: Never Used  Substance and Sexual Activity  . Alcohol use: Yes    Alcohol/week: 7.0 standard drinks    Types: 7 Cans of beer per week    Comment: 1 beer/day  . Drug use: No  . Sexual activity: Not on file  Lifestyle  . Physical activity:    Days per week: Not on file    Minutes per session: Not on file  . Stress: Not on file  Relationships  . Social connections:    Talks on phone: Not on file    Gets together: Not on file    Attends religious service: Not on file    Active member of club or organization: Not on file    Attends meetings of clubs or organizations: Not on file    Relationship status: Not on file  Other Topics Concern  . Not on file  Social History Narrative   Lives with wife, 1 dog. Grown children   Edu: college   Occ: owns mattress store   Activity: active at work, started [employer] to restart running   Diet: good water, fruits/vegetables daily     Family History:  The patient's family history includes CAD in  his father and mother; Cancer in his maternal grandmother; Colon cancer in his maternal grandfather; Congenital heart disease in his mother; Hyperlipidemia in  his father and mother; Hypertension in his father and mother. There is no history of Diabetes, Colon polyps, Esophageal cancer, Rectal cancer, or Stomach cancer.  ROS:   Please see the history of present illness.  All other systems are reviewed and otherwise negative.    PHYSICAL EXAM:   VS:  BP 114/64   Pulse 69   Temp 99 F (37.2 C)   Ht 5' 7"  (1.194 m)   Wt 170 lb 1.9 oz (77.2 kg)   BMI 26.64 kg/m   BMI: Body mass index is 26.64 kg/m. GEN: Well nourished, well developed WM, in no acute distress HEENT: normocephalic, atraumatic Neck: no JVD, carotid bruits, or masses Cardiac: RRR; no murmurs, rubs, or gallops, no edema  Respiratory:  clear to auscultation bilaterally, normal work of breathing GI: soft, nontender, nondistended, + BS MS: no deformity or atrophy, right radial cath site with extensive ecchymosis in early stages of resolution, adequate pulse and capillary refill Skin: warm and dry, no rash Neuro:  Alert and Oriented x 3, Strength and sensation are intact, follows commands Psych: euthymic mood, full affect  Wt Readings from Last 3 Encounters:  03/04/18 170 lb 1.9 oz (77.2 kg)  03/02/18 167 lb (75.8 kg)  02/23/18 167 lb 15.9 oz (76.2 kg)      Studies/Labs Reviewed:   EKG:  EKG was ordered today and personally reviewed by me and demonstrates NSR 64bpm, biatrial enlargement, prior septal infarct, diffuse TW changes representing evolution of prior MI.  Recent Labs: 02/20/2018: ALT 19; B Natriuretic Peptide 14.9; TSH 1.467 02/21/2018: Magnesium 1.9 02/22/2018: BUN 13; Creatinine, Ser 1.07; Potassium 5.1; Sodium 135 02/24/2018: Hemoglobin 12.9; Platelets 242   Lipid Panel    Component Value Date/Time   CHOL 224 (H) 02/20/2018 2345   TRIG 40 02/20/2018 2345   HDL 50 02/20/2018 2345   CHOLHDL 4.5  02/20/2018 2345   VLDL 8 02/20/2018 2345   LDLCALC 166 (H) 02/20/2018 2345    Additional studies/ records that were reviewed today include: Summarized above   ASSESSMENT & PLAN:   1. CAD with recent STEMI - stable from cardiac standpoint. I reviewed plan for triple therapy with Dr. Irish Lack who followed patient in hospital (Dr. Tamala Julian is out). He does recommend discontinuing aspirin after 1 month. PCI was 11/23. For simplicity's sake, we advised last dose of ASA as 03/23/18. He will continue Coumadin and Plavix thereafter. Discussed bleeding precautions. Continue PPI. Continue BB, statin.  2. Post MI Pericarditis - doing well. Given drug interaction with statin will decrease colchicine to once daily per d/w Dr. Irish Lack. Anticipate 3 months of therapy - reviewed with patient. He could stop this the end of February. 3. Ischemic cardiomyopathy/chronic systolic CHF - appears euvolemic. BP was too low in the hospital to consider Entresto. Wife and patient believe he's met maximum amount of medicine he can currently tolerate. Will arrange repeat echo 91 days after discharge date to help guide plan for LifeVest or need for ICD. Per protocol patients are typically not supposed to drive during a period they require LifeVest. Patient was not happy with this recommendation. I told him I would review with MD. He also requests urgent decision regarding his travels. Repeat BMET today with Mg. Relayed 2g sodium restriction, 2L fluid restriction, daily weights with patient education. 4. LV thrombus  - continue Coumadin, ultimate duration will be up to primary cardiologist. Update CBC with labs. 5. HTN - controlled. 6. HLD - check LFTs/lipids in 6 weeks.  Disposition: F/u  with Dr. Benjaman Lobe team APP in 6 weeks (on a day when Dr. Tamala Julian is in the office)  Medication Adjustments/Labs and Tests Ordered: Current medicines are reviewed at length with the patient today.  Concerns regarding medicines are outlined  above. Medication changes, Labs and Tests ordered today are summarized above and listed in the Patient Instructions accessible in Encounters.   Signed, Charlie Pitter, PA-C  03/04/2018 9:25 AM    [address], [place], [place]  [address] Phone: [phone]; Fax: [phone]

## 2018-03-04 ENCOUNTER — Ambulatory Visit: Payer: BLUE CROSS/BLUE SHIELD | Admitting: Physician Assistant

## 2018-03-04 ENCOUNTER — Telehealth (HOSPITAL_COMMUNITY): Payer: Self-pay

## 2018-03-04 ENCOUNTER — Encounter: Payer: Self-pay | Admitting: Physician Assistant

## 2018-03-04 ENCOUNTER — Telehealth: Payer: Self-pay | Admitting: Physician Assistant

## 2018-03-04 VITALS — BP 114/64 | HR 69 | Temp 99.0°F | Ht 67.0 in | Wt 170.1 lb

## 2018-03-04 DIAGNOSIS — I5022 Chronic systolic (congestive) heart failure: Secondary | ICD-10-CM

## 2018-03-04 DIAGNOSIS — E785 Hyperlipidemia, unspecified: Secondary | ICD-10-CM

## 2018-03-04 DIAGNOSIS — I241 Dressler's syndrome: Secondary | ICD-10-CM

## 2018-03-04 DIAGNOSIS — I255 Ischemic cardiomyopathy: Secondary | ICD-10-CM

## 2018-03-04 DIAGNOSIS — I236 Thrombosis of atrium, auricular appendage, and ventricle as current complications following acute myocardial infarction: Secondary | ICD-10-CM | POA: Diagnosis not present

## 2018-03-04 DIAGNOSIS — M79604 Pain in right leg: Secondary | ICD-10-CM

## 2018-03-04 DIAGNOSIS — I2510 Atherosclerotic heart disease of native coronary artery without angina pectoris: Secondary | ICD-10-CM

## 2018-03-04 DIAGNOSIS — I10 Essential (primary) hypertension: Secondary | ICD-10-CM

## 2018-03-04 LAB — BASIC METABOLIC PANEL
BUN/Creatinine Ratio: 22 — ABNORMAL HIGH (ref 9–20)
BUN: 35 mg/dL — AB (ref 6–24)
CALCIUM: 10.3 mg/dL — AB (ref 8.7–10.2)
CHLORIDE: 94 mmol/L — AB (ref 96–106)
CO2: 28 mmol/L (ref 20–29)
Creatinine, Ser: 1.59 mg/dL — ABNORMAL HIGH (ref 0.76–1.27)
GFR calc non Af Amer: 50 mL/min/{1.73_m2} — ABNORMAL LOW (ref >59)
GFR, EST AFRICAN AMERICAN: 58 mL/min/{1.73_m2} — AB (ref >59)
Glucose: 98 mg/dL (ref 65–99)
Potassium: 4.8 mmol/L (ref 3.5–5.2)
Sodium: 138 mmol/L (ref 134–144)

## 2018-03-04 LAB — CBC
HEMOGLOBIN: 14.8 g/dL (ref 13.0–17.7)
Hematocrit: 45.8 % (ref 37.5–51.0)
MCH: 28.1 pg (ref 26.6–33.0)
MCHC: 32.3 g/dL (ref 31.5–35.7)
MCV: 87 fL (ref 79–97)
Platelets: 399 10*3/uL (ref 150–450)
RBC: 5.26 x10E6/uL (ref 4.14–5.80)
RDW: 12.2 % — ABNORMAL LOW (ref 12.3–15.4)
WBC: 16 10*3/uL — ABNORMAL HIGH (ref 3.4–10.8)

## 2018-03-04 LAB — MAGNESIUM: MAGNESIUM: 2 mg/dL (ref 1.6–2.3)

## 2018-03-04 MED ORDER — COLCHICINE 0.6 MG PO TABS: 0.6000 mg | ORAL_TABLET | Freq: Every day | ORAL | 3 refills | Status: DC

## 2018-03-04 NOTE — Telephone Encounter (Signed)
Attempted to contact pt to see if he has made a decision on which CR locate he will be attending. LMTCB

## 2018-03-04 NOTE — Telephone Encounter (Signed)
Pt insurance is active and benefits verified through [facility]. Co-pay $0.00, DED $2,500.00/$2,500.00 met, out of pocket $7,900.00/$3,685.17 met, co-insurance 20%. No pre-authorization required. Passport, 03/04/18 @ 11:11AM, REF# [id number]  Will contact patient to see if he is interested in the Cardiac Rehab Program. If interested, patient will need to complete follow up appt. Once completed, patient will be contacted for scheduling upon review by the RN Navigator.

## 2018-03-04 NOTE — Telephone Encounter (Addendum)
See lab result note from today re: elevated BUN/Cr. Asked pt to call us back. Will need to have him hold Lasix and losartan until he hears back from our office. We will try again tomorrow AM as well.Schae Cando PA-C  Addendum: pt answered his wife's phone when I tried that number. She did clarify he had been taking Lasix daily instead of PRN. I advised he hold Lasix and losartan until we can recheck labs on Monday. We also discussed gentle rehydration. Once I get remainder of labs back will have the office reach out in the AM with further instructions on f/u and travel as well. Terrianna Holsclaw PA-C

## 2018-03-04 NOTE — Patient Instructions (Addendum)
Medication Instructions:  Your physician has recommended you make the following change in your medication:  1.  REDUCE the Colchicine to 1 tablet daily 2.  ON CHRISTMAS DAY, YOU MAY STOP THE ASPIRIN (YOUR LAST DOSE WILL BE 03/23/18)  If you need a refill on your cardiac medications before your next appointment, please call your pharmacy.   Lab work: TODAY:  BMET, CBC, & MAG  6 WEEKS:  FASTING LIPID & CMET  If you have labs (blood work) drawn today and your tests are completely normal, you will receive your results only by: [name] MyChart Message (if you have MyChart) OR . A paper copy in the mail If you have any lab test that is abnormal or we need to change your treatment, we will call you to review the results.  Testing/Procedures: Your physician has requested that you have an echocardiogram 05/27/18. Echocardiography is a painless test that uses sound waves to create images of your heart. It provides your doctor with information about the size and shape of your heart and how well your heart's chambers and valves are working. This procedure takes approximately one hour. There are no restrictions for this procedure.   Follow-Up: Your physician recommends that you schedule a follow-up appointment in: [place] ON A DAY DR. Tamala Julian IS IN THE OFFICE  Any Other Special Instructions Will Be Listed Below (If Applicable).  One of your heart tests showed weakness of the heart muscle (EF is low). This may make you more susceptible to weight gain from fluid retention, which can lead to symptoms that we call heart failure. Please follow these special instructions:  1. Follow a low-salt diet - you are allowed no more than 2,09m of sodium per day. Watch your fluid intake. In general, you should not be taking in more than 2 liters of fluid per day (no more than 8 glasses per day). This includes sources of water in foods like soup, coffee, tea, milk, etc. 2. Weigh yourself on the  same scale at same time of day and keep a log. 3. Call your doctor: (Anytime you feel any of the following symptoms)  - 3lb weight gain overnight or 5lb within a few days - Shortness of breath, with or without a dry hacking cough  - Swelling in the hands, feet or stomach  - If you have to sleep on extra pillows at night in order to breathe   IT IS IMPORTANT TO LET YOUR DOCTOR KNOW EARLY ON IF YOU ARE HAVING SYMPTOMS SO WE CAN HELP YOU!

## 2018-03-05 NOTE — Telephone Encounter (Signed)
  Patient's wife returning call from 03/04/18

## 2018-03-05 NOTE — Telephone Encounter (Signed)
Got remainder of patient's labs back. CBC does show elevated WBC but patient is on prednisone. In absence of acute infective symptoms, should follow up with PCP for continued monitoring of this. Mg level is normal. Calcium level is elevated likely due to dehydration related to Lasix.  I told patient office would call him again this AM to arrange f/u labwork. As stated yesterday should hold Lasix and losartan until he hears back from [place] that he can restart. (If creatinine improves early next week I anticipate resuming losartan and holding losartan but we will need to see what labs show first.) Yesterday I had told wife to have patient gently rehydrate through the weekend, should aim to drink 64oz of noncaffeinated fluid over the next several days. Please arrange BMET on Monday. I talked to Dr. Irish Lack in Dr. Thompson Caul absence about travel - pt mentioned going to [facility] later this month and [place] in 03/2018. Dr. Irish Lack is OK with patient travelling but does recommend we make sure his BMET is stable before finalizing that decision.   At [facility] yesterday we also discussed recommendation of no driving. This recommendation came from fellow providers who had previously spoke with EP. It does not seem there is a consensus on this, however, in speaking with other providers. Dr. Curt Bears with EP planned to review with colleagues to see if they could get an updated consensus. His repeat echo (3 months post MI) will be in late Feb to assess. I told him I would reach out to Dr. Tamala Julian to get input on the driving recommendation. Dr. Tamala Julian is out, but decision is not urgent.  [place] PA-C

## 2018-03-05 NOTE — Telephone Encounter (Signed)
DPR on file ok to s/w pt's wife. I discussed with her that the WBC count is elevated, most likely due to being on Prednisone. I did advise per Melina Copa, PA to still f/u with PCP for elevated WBC. Advised to continue to remain off lasix and losartan until repeat lab work is done ion Monday 112/9 and we have the results back.   Did advise per PA to gently increase fluids over the weekend of 64 oz, to be sure to avoid any caffeine drinks over the next several days. Pt's wife states she said that his kidney's are probably a little dry because she was giving him the lasix everyday instead of the bottle directions daily PRN.   I also advised ok per Dr. Irish Lack pt can travel , though need to make sure BMET is stable before finalizing that decision. Advised as well as the topic of no driving will be discussed further with Dr. Tamala Julian and EP doctors to come to a consensus if ok to drive in regards to Life Vest. I did also advise to keep the appt 05/27/18 for the echo. The pt's wife thanked me for the call and the updates. I will route this message to Melina Copa, PA to update her as to the call today.

## 2018-03-06 ENCOUNTER — Encounter: Payer: Self-pay | Admitting: Emergency Medicine

## 2018-03-06 ENCOUNTER — Inpatient Hospital Stay
Admission: EM | Admit: 2018-03-06 | Discharge: 2018-03-08 | DRG: 385 | Disposition: A | Discharge status: Home / Self Care | Payer: BLUE CROSS/BLUE SHIELD | Attending: Internal Medicine | Admitting: Internal Medicine

## 2018-03-06 ENCOUNTER — Telehealth: Payer: Self-pay | Admitting: Cardiology

## 2018-03-06 DIAGNOSIS — I5021 Acute systolic (congestive) heart failure: Secondary | ICD-10-CM | POA: Diagnosis not present

## 2018-03-06 DIAGNOSIS — I5023 Acute on chronic systolic (congestive) heart failure: Secondary | ICD-10-CM | POA: Diagnosis not present

## 2018-03-06 DIAGNOSIS — Z7952 Long term (current) use of systemic steroids: Secondary | ICD-10-CM | POA: Diagnosis not present

## 2018-03-06 DIAGNOSIS — Z881 Allergy status to other antibiotic agents status: Secondary | ICD-10-CM | POA: Diagnosis not present

## 2018-03-06 DIAGNOSIS — I255 Ischemic cardiomyopathy: Secondary | ICD-10-CM | POA: Diagnosis present

## 2018-03-06 DIAGNOSIS — Z888 Allergy status to other drugs, medicaments and biological substances status: Secondary | ICD-10-CM | POA: Diagnosis not present

## 2018-03-06 DIAGNOSIS — N400 Enlarged prostate without lower urinary tract symptoms: Secondary | ICD-10-CM | POA: Diagnosis present

## 2018-03-06 DIAGNOSIS — I252 Old myocardial infarction: Secondary | ICD-10-CM | POA: Diagnosis not present

## 2018-03-06 DIAGNOSIS — K922 Gastrointestinal hemorrhage, unspecified: Secondary | ICD-10-CM | POA: Diagnosis not present

## 2018-03-06 DIAGNOSIS — Z7901 Long term (current) use of anticoagulants: Secondary | ICD-10-CM | POA: Diagnosis not present

## 2018-03-06 DIAGNOSIS — I2102 ST elevation (STEMI) myocardial infarction involving left anterior descending coronary artery: Secondary | ICD-10-CM | POA: Diagnosis not present

## 2018-03-06 DIAGNOSIS — I236 Thrombosis of atrium, auricular appendage, and ventricle as current complications following acute myocardial infarction: Secondary | ICD-10-CM | POA: Diagnosis not present

## 2018-03-06 DIAGNOSIS — K5090 Crohn's disease, unspecified, without complications: Secondary | ICD-10-CM | POA: Diagnosis not present

## 2018-03-06 DIAGNOSIS — I472 Ventricular tachycardia: Secondary | ICD-10-CM | POA: Diagnosis present

## 2018-03-06 DIAGNOSIS — K200 Eosinophilic esophagitis: Secondary | ICD-10-CM | POA: Diagnosis not present

## 2018-03-06 DIAGNOSIS — Z7902 Long term (current) use of antithrombotics/antiplatelets: Secondary | ICD-10-CM | POA: Diagnosis not present

## 2018-03-06 DIAGNOSIS — I2510 Atherosclerotic heart disease of native coronary artery without angina pectoris: Secondary | ICD-10-CM | POA: Diagnosis not present

## 2018-03-06 DIAGNOSIS — K50111 Crohn's disease of large intestine with rectal bleeding: Principal | ICD-10-CM

## 2018-03-06 DIAGNOSIS — Z79899 Other long term (current) drug therapy: Secondary | ICD-10-CM | POA: Diagnosis not present

## 2018-03-06 DIAGNOSIS — N179 Acute kidney failure, unspecified: Secondary | ICD-10-CM | POA: Diagnosis not present

## 2018-03-06 DIAGNOSIS — I110 Hypertensive heart disease with heart failure: Secondary | ICD-10-CM | POA: Diagnosis not present

## 2018-03-06 DIAGNOSIS — I10 Essential (primary) hypertension: Secondary | ICD-10-CM | POA: Diagnosis not present

## 2018-03-06 DIAGNOSIS — E785 Hyperlipidemia, unspecified: Secondary | ICD-10-CM | POA: Diagnosis not present

## 2018-03-06 DIAGNOSIS — F17290 Nicotine dependence, other tobacco product, uncomplicated: Secondary | ICD-10-CM | POA: Diagnosis present

## 2018-03-06 DIAGNOSIS — Z955 Presence of coronary angioplasty implant and graft: Secondary | ICD-10-CM

## 2018-03-06 DIAGNOSIS — I25118 Atherosclerotic heart disease of native coronary artery with other forms of angina pectoris: Secondary | ICD-10-CM | POA: Diagnosis not present

## 2018-03-06 DIAGNOSIS — I214 Non-ST elevation (NSTEMI) myocardial infarction: Secondary | ICD-10-CM | POA: Diagnosis not present

## 2018-03-06 DIAGNOSIS — Z7982 Long term (current) use of aspirin: Secondary | ICD-10-CM | POA: Diagnosis not present

## 2018-03-06 DIAGNOSIS — M810 Age-related osteoporosis without current pathological fracture: Secondary | ICD-10-CM | POA: Diagnosis present

## 2018-03-06 DIAGNOSIS — I241 Dressler's syndrome: Secondary | ICD-10-CM | POA: Diagnosis not present

## 2018-03-06 DIAGNOSIS — Z8719 Personal history of other diseases of the digestive system: Secondary | ICD-10-CM | POA: Diagnosis not present

## 2018-03-06 DIAGNOSIS — K50811 Crohn's disease of both small and large intestine with rectal bleeding: Secondary | ICD-10-CM | POA: Diagnosis not present

## 2018-03-06 DIAGNOSIS — K921 Melena: Secondary | ICD-10-CM | POA: Diagnosis not present

## 2018-03-06 LAB — COMPREHENSIVE METABOLIC PANEL
ALBUMIN: 3.8 g/dL (ref 3.5–5.0)
ALT: 58 U/L — ABNORMAL HIGH (ref 0–44)
ANION GAP: 9 (ref 5–15)
AST: 26 U/L (ref 15–41)
Alkaline Phosphatase: 58 U/L (ref 38–126)
BILIRUBIN TOTAL: 0.8 mg/dL (ref 0.3–1.2)
BUN: 30 mg/dL — ABNORMAL HIGH (ref 6–20)
CALCIUM: 8.8 mg/dL — AB (ref 8.9–10.3)
CO2: 30 mmol/L (ref 22–32)
Chloride: 101 mmol/L (ref 98–111)
Creatinine, Ser: 1.26 mg/dL — ABNORMAL HIGH (ref 0.61–1.24)
GFR calc Af Amer: >60 mL/min (ref >60)
GLUCOSE: 110 mg/dL — AB (ref 70–99)
Potassium: 4.1 mmol/L (ref 3.5–5.1)
Sodium: 140 mmol/L (ref 135–145)
TOTAL PROTEIN: 6.6 g/dL (ref 6.5–8.1)

## 2018-03-06 LAB — CBC
HCT: 45.3 % (ref 39.0–52.0)
Hemoglobin: 14.3 g/dL (ref 13.0–17.0)
MCH: 29.3 pg (ref 26.0–34.0)
MCHC: 31.6 g/dL (ref 30.0–36.0)
MCV: 92.8 fL (ref 80.0–100.0)
Platelets: 365 10*3/uL (ref 150–400)
RBC: 4.88 MIL/uL (ref 4.22–5.81)
RDW: 13.1 % (ref 11.5–15.5)
WBC: 16 10*3/uL — ABNORMAL HIGH (ref 4.0–10.5)
nRBC: 0 % (ref 0.0–0.2)

## 2018-03-06 LAB — PROTIME-INR
INR: 3.29
PROTHROMBIN TIME: 33 s — AB (ref 11.4–15.2)

## 2018-03-06 LAB — TYPE AND SCREEN
ABO/RH(D): O POS
Antibody Screen: NEGATIVE

## 2018-03-06 LAB — HEMOGLOBIN AND HEMATOCRIT, BLOOD
HCT: 43.2 % (ref 39.0–52.0)
Hemoglobin: 14 g/dL (ref 13.0–17.0)

## 2018-03-06 MED ORDER — ALENDRONATE SODIUM 70 MG PO TABS: 70.0000 mg | ORAL_TABLET | ORAL | Status: DC

## 2018-03-06 MED ORDER — SODIUM CHLORIDE 0.9% FLUSH: 3.0000 mL | INTRAVENOUS | Status: DC | PRN

## 2018-03-06 MED ORDER — HYDROXYZINE HCL 25 MG PO TABS
100.0000 mg | ORAL_TABLET | Freq: Every day | ORAL | Status: DC
  Administered 2018-03-06 – 2018-03-07 (×2): 100 mg via ORAL
  Filled 2018-03-06 (×2): qty 4

## 2018-03-06 MED ORDER — DIPHENOXYLATE-ATROPINE 2.5-0.025 MG PO TABS
1.0000 | ORAL_TABLET | Freq: Four times a day (QID) | ORAL | Status: DC | PRN
  Filled 2018-03-06: qty 1

## 2018-03-06 MED ORDER — ACETAMINOPHEN 325 MG PO TABS: 650.0000 mg | ORAL_TABLET | Freq: Four times a day (QID) | ORAL | Status: DC | PRN

## 2018-03-06 MED ORDER — SODIUM CHLORIDE 0.9 % IV SOLN
80.0000 mg | Freq: Once | INTRAVENOUS | Status: AC
  Administered 2018-03-06: 80 mg via INTRAVENOUS
  Filled 2018-03-06: qty 80

## 2018-03-06 MED ORDER — ONDANSETRON HCL 4 MG/2ML IJ SOLN: 4.0000 mg | Freq: Four times a day (QID) | INTRAMUSCULAR | Status: DC | PRN

## 2018-03-06 MED ORDER — ASPIRIN EC 81 MG PO TBEC: 81.0000 mg | DELAYED_RELEASE_TABLET | Freq: Every day | ORAL | Status: DC

## 2018-03-06 MED ORDER — SODIUM CHLORIDE 0.9 % IV SOLN
INTRAVENOUS | Status: DC
  Administered 2018-03-06 – 2018-03-07 (×2): via INTRAVENOUS

## 2018-03-06 MED ORDER — VEDOLIZUMAB 300 MG IV SOLR: 300.0000 mg | INTRAVENOUS | Status: DC

## 2018-03-06 MED ORDER — NITROGLYCERIN 0.4 MG SL SUBL: 0.4000 mg | SUBLINGUAL_TABLET | SUBLINGUAL | Status: DC | PRN

## 2018-03-06 MED ORDER — ACETAMINOPHEN 650 MG RE SUPP: 650.0000 mg | Freq: Four times a day (QID) | RECTAL | Status: DC | PRN

## 2018-03-06 MED ORDER — COLCHICINE 0.6 MG PO TABS
0.6000 mg | ORAL_TABLET | Freq: Every day | ORAL | Status: DC
  Administered 2018-03-07 – 2018-03-08 (×2): 0.6 mg via ORAL
  Filled 2018-03-06 (×2): qty 1

## 2018-03-06 MED ORDER — GABAPENTIN 300 MG PO CAPS
300.0000 mg | ORAL_CAPSULE | Freq: Every day | ORAL | Status: DC
  Administered 2018-03-06 – 2018-03-07 (×2): 300 mg via ORAL
  Filled 2018-03-06 (×2): qty 1

## 2018-03-06 MED ORDER — CARVEDILOL 3.125 MG PO TABS
3.1250 mg | ORAL_TABLET | Freq: Two times a day (BID) | ORAL | Status: DC
  Administered 2018-03-07 – 2018-03-08 (×3): 3.125 mg via ORAL
  Filled 2018-03-06 (×3): qty 1

## 2018-03-06 MED ORDER — TAMSULOSIN HCL 0.4 MG PO CAPS
0.4000 mg | ORAL_CAPSULE | Freq: Every day | ORAL | Status: DC
  Administered 2018-03-07 – 2018-03-08 (×2): 0.4 mg via ORAL
  Filled 2018-03-06 (×2): qty 1

## 2018-03-06 MED ORDER — CALCIUM 1200 1200-1000 MG-UNIT PO CHEW: CHEWABLE_TABLET | Freq: Two times a day (BID) | ORAL | Status: DC

## 2018-03-06 MED ORDER — ONDANSETRON HCL 4 MG PO TABS: 4.0000 mg | ORAL_TABLET | Freq: Four times a day (QID) | ORAL | Status: DC | PRN

## 2018-03-06 MED ORDER — PANTOPRAZOLE SODIUM 40 MG IV SOLR: 40.0000 mg | Freq: Two times a day (BID) | INTRAVENOUS | Status: DC

## 2018-03-06 MED ORDER — CALCIUM CARBONATE ANTACID 500 MG PO CHEW
2.0000 | CHEWABLE_TABLET | Freq: Two times a day (BID) | ORAL | Status: DC
  Administered 2018-03-06 – 2018-03-08 (×4): 400 mg via ORAL
  Filled 2018-03-06 (×4): qty 2

## 2018-03-06 MED ORDER — LORATADINE 10 MG PO TABS
10.0000 mg | ORAL_TABLET | Freq: Every day | ORAL | Status: DC
  Administered 2018-03-07 – 2018-03-08 (×2): 10 mg via ORAL
  Filled 2018-03-06 (×2): qty 1

## 2018-03-06 MED ORDER — METHYLPREDNISOLONE SODIUM SUCC 125 MG IJ SOLR
125.0000 mg | Freq: Once | INTRAMUSCULAR | Status: AC
  Administered 2018-03-06: 125 mg via INTRAVENOUS
  Filled 2018-03-06: qty 2

## 2018-03-06 MED ORDER — METRONIDAZOLE IN NACL 5-0.79 MG/ML-% IV SOLN
500.0000 mg | Freq: Once | INTRAVENOUS | Status: AC
  Administered 2018-03-06: 500 mg via INTRAVENOUS
  Filled 2018-03-06: qty 100

## 2018-03-06 MED ORDER — WARFARIN SODIUM 5 MG PO TABS: 5.0000 mg | ORAL_TABLET | Freq: Every day | ORAL | Status: DC

## 2018-03-06 MED ORDER — PREDNISONE 1 MG PO TABS: 1.0000 mg | ORAL_TABLET | ORAL | Status: DC

## 2018-03-06 MED ORDER — FUROSEMIDE 40 MG PO TABS: 40.0000 mg | ORAL_TABLET | Freq: Every day | ORAL | Status: DC | PRN

## 2018-03-06 MED ORDER — ADULT MULTIVITAMIN W/MINERALS CH
1.0000 | ORAL_TABLET | Freq: Every day | ORAL | Status: DC
  Administered 2018-03-06 – 2018-03-08 (×3): 1 via ORAL
  Filled 2018-03-06 (×3): qty 1

## 2018-03-06 MED ORDER — ATORVASTATIN CALCIUM 20 MG PO TABS
80.0000 mg | ORAL_TABLET | Freq: Every day | ORAL | Status: DC
  Administered 2018-03-07: 80 mg via ORAL
  Filled 2018-03-06: qty 4

## 2018-03-06 MED ORDER — PREDNISONE 5 MG PO TABS
18.0000 mg | ORAL_TABLET | ORAL | Status: DC
  Administered 2018-03-07 – 2018-03-08 (×2): 18 mg via ORAL
  Filled 2018-03-06 (×2): qty 3

## 2018-03-06 MED ORDER — LOSARTAN POTASSIUM 25 MG PO TABS: 25.0000 mg | ORAL_TABLET | Freq: Every day | ORAL | Status: DC

## 2018-03-06 MED ORDER — SODIUM CHLORIDE 0.9% FLUSH
3.0000 mL | Freq: Two times a day (BID) | INTRAVENOUS | Status: DC
  Administered 2018-03-06 – 2018-03-07 (×2): 3 mL via INTRAVENOUS

## 2018-03-06 MED ORDER — SODIUM CHLORIDE 0.9 % IV SOLN: 250.0000 mL | INTRAVENOUS | Status: DC | PRN

## 2018-03-06 MED ORDER — CLOPIDOGREL BISULFATE 75 MG PO TABS
75.0000 mg | ORAL_TABLET | Freq: Every day | ORAL | Status: DC
  Administered 2018-03-07 – 2018-03-08 (×2): 75 mg via ORAL
  Filled 2018-03-06 (×2): qty 1

## 2018-03-06 MED ORDER — SODIUM CHLORIDE 0.9 % IV SOLN
8.0000 mg/h | INTRAVENOUS | Status: DC
  Administered 2018-03-06 – 2018-03-07 (×2): 8 mg/h via INTRAVENOUS
  Filled 2018-03-06 (×2): qty 80

## 2018-03-06 NOTE — H&P (Addendum)
[facility] NAME: Jack [name]    MR#:  [id number]  DATE OF BIRTH:  [birth year]  DATE OF ADMISSION:  03/06/2018  PRIMARY CARE PHYSICIAN: [name], MD   REQUESTING/REFERRING PHYSICIAN:   CHIEF COMPLAINT:   Chief Complaint  Patient presents with  . Rectal Bleeding    HISTORY OF PRESENT ILLNESS: Jack [name]  is a 52 y.o. male with a known history per below, status post recent hospitalization at [facility] for non-STEMI status post PCI/stenting of LAD lesion, LifeVest in place, on aspirin/Plavix/Coumadin for apical thrombus, presenting to emergency room with bright red blood per rectum since yesterday, in the emergency room hemoglobin was normal, white count 16,000-most likely from steroid use for Crohn's disease, INR 3.2, creatinine 1.2, patient evaluated in the emergency room, no apparent distress, resting comfortably in bed, patient is now been admitted for acute GI bleeding.  PAST MEDICAL HISTORY:   Past Medical History:  Diagnosis Date  . BPH (benign prostatic hypertrophy)   . CAD (coronary artery disease)    a. anterior STEMI 01/2018 -  proximal occlusion of LAD, treated with DES. Cath also showed 20% distal LM, 95% ostial-prox small-moderate ramus, 70-80% ostial Cx, 70% dominant ostial OM, 50-60% prox Cx, 70% RCA. EF 35% by cath with LVEDP 49mHg. Med rx for residual disease. Course complicated by post MI pericarditis and LV thrombus.  . Chronic systolic (congestive) heart failure ([place]   . Colitis   . Colon polyp    inflammatory  . Crohn disease ([place]   history uveitis, involvement of intestines and lungs  . Diverticulosis   . Eosinophilic esophagitis   . Essential hypertension   . GERD (gastroesophageal reflux disease)   . History of chicken pox   . History of gastroesophageal reflux (GERD)   . History of seizure 1995   grand mal x1, completed 6 yrs dilantin. no seizures since  . Hyperlipidemia   .  Internal hemorrhoids   . Ischemic cardiomyopathy   . LV (left ventricular) mural thrombus following MI ([place] 01/2018  . Osteoporosis 11/2015   DEXA T -2.9  . Schwannoma 2007   L axilla s/p surgery  . Seizures ([place]    last seizure 1995 and only x 1 seizure-   . Ulnar neuropathy    h/o this from L arm schwannoma    PAST SURGICAL HISTORY:  Past Surgical History:  Procedure Laterality Date  . APPENDECTOMY  2000  . CARDIOVASCULAR STRESS TEST  01/2015   low risk study  . CHOLECYSTECTOMY  2000  . COLONOSCOPY  08/2014   f/u crohn's, 4 inflammatory polyps, diverticulosis, improved, rpt 2 yrs ([place])  . COLONOSCOPY  01/2015   during hospitalization - active ileitis/colitis  . CORONARY ANGIOGRAPHY N/A 02/21/2018   Procedure: CORONARY ANGIOGRAPHY;  Surgeon: SBelva Crome MD;  [facility];  Service: Cardiovascular;  Laterality: N/A;  . CORONARY/GRAFT ACUTE MI REVASCULARIZATION N/A 02/20/2018   Procedure: Coronary/Graft Acute MI Revascularization;  Surgeon: SBelva Crome MD;  [facility];  Service: Cardiovascular;  Laterality: N/A;  . ESOPHAGOGASTRODUODENOSCOPY  07/2014   h/o EE resolved, focal reflux esophagitis, chronic active gastritis  . extremity surgery Left    ulnar nerve repair after schwannoma removal  . INGUINAL HERNIA REPAIR Bilateral 2017  . LEFT HEART CATH AND CORONARY ANGIOGRAPHY N/A 02/20/2018   Procedure: LEFT HEART CATH AND CORONARY ANGIOGRAPHY;  Surgeon: SBelva Crome MD;  [facility];  Service: Cardiovascular;  Laterality: N/A;  . LUMBAR SPINE SURGERY  2011  . POLYPECTOMY    . SPINAL CORD STIMULATOR IMPLANT  2012   x2 (Dr Berton Mount)  . TONSILLECTOMY  1996  . TUMOR REMOVAL Left 2007   schwannoma from L armpit  . UPPER GASTROINTESTINAL ENDOSCOPY      SOCIAL HISTORY:  Social History   Tobacco Use  . Smoking status: Current Some Day Smoker    Types: Cigars  . Smokeless tobacco: Never Used  Substance Use  Topics  . Alcohol use: Yes    Alcohol/week: 7.0 standard drinks    Types: 7 Cans of beer per week    Comment: 1 beer/day    FAMILY HISTORY:  Family History  Problem Relation Age of Onset  . CAD Mother        CABG  . Congenital heart disease Mother   . Hypertension Mother   . Hyperlipidemia Mother   . CAD Father        CABG  . Hypertension Father   . Hyperlipidemia Father   . Cancer Maternal Grandmother        colon  . Colon cancer Maternal Grandfather   . Diabetes Neg Hx   . Colon polyps Neg Hx   . Esophageal cancer Neg Hx   . Rectal cancer Neg Hx   . Stomach cancer Neg Hx     DRUG ALLERGIES:  Allergies  Allergen Reactions  . Cephalexin Nausea And Vomiting    Other Reaction: GI UPSET  . Duloxetine Other (See Comments)    Headaches     REVIEW OF SYSTEMS:   CONSTITUTIONAL: No fever, fatigue or weakness.  EYES: No blurred or double vision.  EARS, NOSE, AND THROAT: No tinnitus or ear pain.  RESPIRATORY: No cough, shortness of breath, wheezing or hemoptysis.  CARDIOVASCULAR: No chest pain, orthopnea, edema.  GASTROINTESTINAL: No nausea, vomiting, diarrhea or abdominal pain. +GIB GENITOURINARY: No dysuria, hematuria.  ENDOCRINE: No polyuria, nocturia,  HEMATOLOGY: No anemia, easy bruising or bleeding SKIN: No rash or lesion. MUSCULOSKELETAL: No joint pain or arthritis.   NEUROLOGIC: No tingling, numbness, weakness.  PSYCHIATRY: No anxiety or depression.   MEDICATIONS AT HOME:  Prior to Admission medications   Medication Sig Start Date End Date Taking? Authorizing Provider  acetaminophen (TYLENOL) 325 MG tablet Take 650 mg by mouth every 6 (six) hours as needed for mild pain or headache.    Yes [provider]  alendronate (FOSAMAX) 70 MG tablet Take 70 mg by mouth every Saturday.  01/11/18  Yes [provider]  aspirin 81 MG EC tablet Take 1 tablet (81 mg total) by mouth daily. 02/25/18  Yes Sande Rives E, PA-C  atorvastatin (LIPITOR) 80  MG tablet Take 1 tablet (80 mg total) by mouth daily at 6 PM. 02/24/18  Yes Sande Rives E, PA-C  Calcium Carbonate-Vit D-Min (CALCIUM 1200 PO) Take 1 capsule by mouth 2 (two) times daily.   Yes [provider]  carvedilol (COREG) 3.125 MG tablet Take 1 tablet (3.125 mg total) by mouth 2 (two) times daily with a meal. 02/24/18  Yes Sande Rives E, PA-C  cetirizine (ZYRTEC) 10 MG tablet Take 10 mg by mouth daily.  07/27/17 07/27/18 Yes [provider]  clopidogrel (PLAVIX) 75 MG tablet Take 1 tablet (75 mg total) by mouth daily. 02/24/18  Yes Sande Rives E, PA-C  colchicine 0.6 MG tablet Take 1 tablet (0.6 mg total) by mouth daily. 03/04/18  Yes Dunn,  Dayna N, PA-C  diphenoxylate-atropine (LOMOTIL) 2.5-0.025 MG tablet Take 1-2 tablets by mouth 4 times daily as needed for diarrhea   Yes [provider]  furosemide (LASIX) 40 MG tablet Take 1 tablet (40 mg total) by mouth daily as needed for fluid or edema. 02/24/18 02/24/19 Yes Sande Rives E, PA-C  gabapentin (NEURONTIN) 300 MG capsule Take one tab QHS for 4 days, then one tab BID for 4 days, then one tab TID Patient taking differently: 600 mg. one tab BID for 4 days, then one tab TID 03/02/18  Yes Draper, Timothy R, DO  hydrOXYzine (ATARAX/VISTARIL) 25 MG tablet TAKE 4 TABLETS (100 MG TOTAL) BY MOUTH AT BEDTIME. 01/26/18  Yes [name], MD  Multiple Vitamin (MULTIVITAMIN) capsule Take 1 capsule by mouth daily.   Yes [provider]  nitroGLYCERIN (NITROSTAT) 0.4 MG SL tablet Place 1 tablet (0.4 mg total) under the tongue every 5 (five) minutes as needed for chest pain. 02/24/18 02/24/19 Yes Sande Rives E, PA-C  pantoprazole (PROTONIX) 40 MG tablet Take 1 tablet (40 mg total) by mouth daily. 02/24/18 02/24/19 Yes Sande Rives E, PA-C  predniSONE (DELTASONE) 10 MG tablet Take 2 tablets (20 mg total) by mouth daily with breakfast. Patient taking differently: Take 18 mg by mouth See admin  instructions. Take 81m with (8) 161mtablets to equal 18 mg by mouth once daily 09/07/17  Yes GuRia BushMD  tamsulosin (FLOMAX) 0.4 MG CAPS capsule TAKE 1 CAPSULE (0.4 MG TOTAL) BY MOUTH DAILY. 04/29/17  Yes GuRia BushMD  warfarin (COUMADIN) 5 MG tablet Take 1.5 tablet tonight (02/24/2018) and 1 tablet tomorrow (02/25/2018). Will recheck INR on Friday (02/25/2018). Patient taking differently: 5 mg daily.  02/24/18  Yes GoSande Rives, PA-C  losartan (COZAAR) 25 MG tablet Take 1 tablet (25 mg total) by mouth daily. 02/25/18   GoSande Rives, PA-C  predniSONE (DELTASONE) 1 MG tablet Take by mouth See admin instructions. Take 61m27mith (1) 66m102mblet to equal 161mg100mmouth once daily     [provider]  vedolizumab (ENTYVIO) 300 MG injection Inject 300 mg into the vein every 8 (eight) weeks.     [provider]      PHYSICAL EXAMINATION:   VITAL SIGNS: Blood pressure 127/80, pulse (!) 58, resp. rate 18, SpO2 (!) 86 %.  GENERAL:  52 y.48-year-old patient lying in the bed with no acute distress.  EYES: Pupils equal, round, reactive to light and accommodation. No scleral icterus. Extraocular muscles intact.  HEENT: Head atraumatic, normocephalic. Oropharynx and nasopharynx clear.  NECK:  Supple, no jugular venous distention. No thyroid enlargement, no tenderness.  LUNGS: Normal breath sounds bilaterally, no wheezing, rales,rhonchi or crepitation. No use of accessory muscles of respiration.  CARDIOVASCULAR: S1, S2 normal. No murmurs, rubs, or gallops.  ABDOMEN: Soft, nontender, nondistended. Bowel sounds present. No organomegaly or mass.  EXTREMITIES: No pedal edema, cyanosis, or clubbing.  NEUROLOGIC: Cranial nerves II through XII are intact. Muscle strength 5/5 in all extremities. Sensation intact. Gait not checked.  PSYCHIATRIC: The patient is alert and oriented x 3.  SKIN: No obvious rash, lesion, or ulcer.   LABORATORY PANEL:   CBC Recent Labs   Lab 03/04/18 1033 03/06/18 1439  WBC 16.0* 16.0*  HGB 14.8 14.3  HCT 45.8 45.3  PLT 399 365  MCV 87 92.8  MCH 28.1 29.3  MCHC 32.3 31.6  RDW 12.2* 13.1   ------------------------------------------------------------------------------------------------------------------  Chemistries  Recent Labs  Lab 03/04/18 1033  03/06/18 1439  NA 138 140  K 4.8 4.1  CL 94* 101  CO2 28 30  GLUCOSE 98 110*  BUN 35* 30*  CREATININE 1.59* 1.26*  CALCIUM 10.3* 8.8*  MG 2.0  --   AST  --  26  ALT  --  58*  ALKPHOS  --  58  BILITOT  --  0.8   ------------------------------------------------------------------------------------------------------------------ estimated creatinine clearance is 65.6 mL/min (A) (by C-G formula based on SCr of 1.26 mg/dL (H)). ------------------------------------------------------------------------------------------------------------------ No results for input(s): TSH, T4TOTAL, T3FREE, THYROIDAB in the last 72 hours.  Invalid input(s): FREET3   Coagulation profile Recent Labs  Lab 03/01/18 1105 03/06/18 1439  INR 2.7 3.29   ------------------------------------------------------------------------------------------------------------------- No results for input(s): DDIMER in the last 72 hours. -------------------------------------------------------------------------------------------------------------------  Cardiac Enzymes No results for input(s): CKMB, TROPONINI, MYOGLOBIN in the last 168 hours.  Invalid input(s): CK ------------------------------------------------------------------------------------------------------------------ Invalid input(s): POCBNP  ---------------------------------------------------------------------------------------------------------------  Urinalysis No results found for: COLORURINE, APPEARANCEUR, LABSPEC, PHURINE, GLUCOSEU, HGBUR, BILIRUBINUR, KETONESUR, PROTEINUR, UROBILINOGEN, NITRITE, LEUKOCYTESUR   RADIOLOGY: No  results found.  EKG: Orders placed or performed in visit on 03/04/18  . EKG 12-Lead    IMPRESSION AND PLAN: *Acute BRBPR Exacerbated by DAPT and Coumadin Most likely secondary to diverticular disease, noted history of Crohn's Admit to telemetry bed, type and screen, gastroenterology to see, Protonix drip for now, H&H every 6 hours, CBC daily, transfuse as needed, n.p.o. after midnight  *Subacute non-STEMI Status post recent PCI with stenting of LAD lesion approximately 2 weeks ago Continue DAPT-aspirin/Plavix, per cardiology/Dr. Franco Collet Coumadin/start heparin drip if INR becomes less than 2, continue Lipitor, Coreg, losartan, cardiology for expert opinion, nitrates as needed  *Subacute apical heart clot  Stable Coumadin on hold as stated above, check INR daily   *Chronic systolic congestive heart failure without exacerbation Echocardiogram from last month noted for ejection fraction 35-40%, LifeVest in place, continue Lasix, DAPT, on Coumadin, Lipitor, Coreg, losartan, cardiology consulted for expert opinion, strict I&O monitoring, daily weights  *Chronic Crohn's disease Stable On chronic steroids which will be continued, gastroneurology to see  *Chronic BPH Stable Continue Flomax   All the records are reviewed and case discussed with ED provider. Management plans discussed with the patient, family and they are in agreement.  CODE STATUS:full Code Status History    Date Active Date Inactive Code Status Order ID Comments User Context   02/20/2018 2311 02/24/2018 2014 Full Code [id number]  Belva Crome, MD Inpatient   02/20/2018 2311 02/20/2018 2311 Full Code [id number]  Belva Crome, MD Inpatient   09/10/2016 1630 09/14/2016 1533 Full Code [id number]  Bonnielee Haff, MD Inpatient       TOTAL TIME TAKING CARE OF THIS PATIENT: 45 minutes.    Avel Peace Meiya Wisler M.D on 03/06/2018   Between 7am to 6pm - Pager - [phone]  After 6pm go to [email or web address] - password [email or web address]  [facility]  Office  [phone]  CC: Primary care physician; [name], MD   Note: This dictation was prepared with Dragon dictation along with smaller phrase technology. Any transcriptional errors that result from this process are unintentional.

## 2018-03-06 NOTE — Progress Notes (Signed)

## 2018-03-06 NOTE — Progress Notes (Signed)
Family Meeting Note  Advance Directive:yes  Today a meeting took place with the Patient.  Patient is able to participate   The following clinical team members were present during this meeting:MD  The following were discussed:Patient's diagnosis: Subacute MI, GI bleeding, apical heart clot, Crohn's disease, Patient's progosis: Unable to determine and Goals for treatment: Full Code  Additional follow-up to be provided: prn  Time spent during discussion:20 minutes  Gorden Harms, MD

## 2018-03-06 NOTE — Telephone Encounter (Signed)
The patient's wife called today saying that he has had some blood in his stool.  Initially it was bright red and now it is dark red.  He has not had any weakness or dizziness or syncope.  He is on aspirin Plavix and Coumadin after recent PCI.  I suggested he go to his nearest facility and be seen to have labs drawn.  Kerin Ransom PA-C 03/06/2018 1:07 PM

## 2018-03-06 NOTE — ED Provider Notes (Signed)
[facility] Emergency Department Provider Note  ____________________________________________  Time seen: Approximately 7:28 PM  I have reviewed the triage vital signs and the nursing notes.   HISTORY  Chief Complaint Rectal Bleeding    HPI Jack [name] is a 52 y.o. male with a history of vasculitis, Crohn's disease, recent STEMI with stenting of the LAD, complicated by apical thrombus in the LV necessitating Plavix and warfarin treatment.  The patient presents with rectal bleeding that started yesterday.  He is noticed increased abdominal rumbling and discomfort without actual pain.  He has had the urge to have bowel movements very frequently, yesterday he had a large bowel movement consisting of only blood.  Today he has had several more bowel movements with a large amount of bloody output.  He denies fevers or chills.  No lightheadedness or syncope.  Denies nausea or vomiting.  No aggravating or alleviating factors.  No radiating discomfort.      Past Medical History:  Diagnosis Date  . BPH (benign prostatic hypertrophy)   . CAD (coronary artery disease)    a. anterior STEMI 01/2018 -  proximal occlusion of LAD, treated with DES. Cath also showed 20% distal LM, 95% ostial-prox small-moderate ramus, 70-80% ostial Cx, 70% dominant ostial OM, 50-60% prox Cx, 70% RCA. EF 35% by cath with LVEDP 62mHg. Med rx for residual disease. Course complicated by post MI pericarditis and LV thrombus.  . Chronic systolic (congestive) heart failure ([place]   . Colitis   . Colon polyp    inflammatory  . Cro[place] disease ([place] 1992   history uveitis, involvement of intestines and lungs  . Diverticulosis   . Eosinophilic esophagitis   . Essential hypertension   . GERD (gastroesophageal reflux disease)   . History of chicken pox   . History of gastroesophageal reflux (GERD)   . History of seizure 1995   grand mal x1, completed 6 yrs dilantin. no seizures since  .  Hyperlipidemia   . Internal hemorrhoids   . Ischemic cardiomyopathy   . LV (left ventricular) mural thrombus following MI ([place] 01/2018  . Osteoporosis 11/2015   DEXA T -2.9  . Schwannoma 2007   L axilla s/p surgery  . Seizures ([place]    last seizure 1995 and only x 1 seizure-   . Ulnar neuropathy    h/o this from L arm schwannoma     Patient Active Problem List   Diagnosis Date Noted  . Encounter for therapeutic drug monitoring 03/01/2018  . LV (left ventricular) mural thrombus following MI ([place] 02/24/2018  . Warfarin anticoagulation 02/24/2018  . Post-MI pericarditis ([place] 02/24/2018  . Hyperlipidemia 02/24/2018  . Acute ST elevation myocardial infarction (STEMI) involving left anterior descending (LAD) coronary artery ([place] 02/20/2018  . Tinea cruris 06/09/2017  . Elevated PSA measurement 03/21/2017  . Vasculitis ([place] 01/08/2017  . Defecation urgency 08/04/2016  . Healthcare maintenance 03/18/2016  . Eosinophilic esophagitis   . Hypereosinophilic syndrome 098/92/1194 . Essential hypertension   . Benign prostatic hyperplasia   . Osteoporosis 11/30/2015  . Schwannoma 03/31/2005  . Crohn disease ([place] 03/31/1990     Past Surgical History:  Procedure Laterality Date  . APPENDECTOMY  2000  . CARDIOVASCULAR STRESS TEST  01/2015   low risk study  . CHOLECYSTECTOMY  2000  . COLONOSCOPY  08/2014   f/u crohn's, 4 inflammatory polyps, diverticulosis, improved, rpt 2 yrs ([place])  . COLONOSCOPY  01/2015   during hospitalization - active ileitis/colitis  . CORONARY ANGIOGRAPHY  N/A 02/21/2018   Procedure: CORONARY ANGIOGRAPHY;  Surgeon: Belva Crome, MD;  [facility];  Service: Cardiovascular;  Laterality: N/A;  . CORONARY/GRAFT ACUTE MI REVASCULARIZATION N/A 02/20/2018   Procedure: Coronary/Graft Acute MI Revascularization;  Surgeon: Belva Crome, MD;  Location: [facility];  Service: Cardiovascular;  Laterality: N/A;  .  ESOPHAGOGASTRODUODENOSCOPY  07/2014   h/o EE resolved, focal reflux esophagitis, chronic active gastritis  . extremity surgery Left    ulnar nerve repair after schwannoma removal  . INGUINAL HERNIA REPAIR Bilateral 2017  . LEFT HEART CATH AND CORONARY ANGIOGRAPHY N/A 02/20/2018   Procedure: LEFT HEART CATH AND CORONARY ANGIOGRAPHY;  Surgeon: Belva Crome, MD;  Location: [place] [facility];  Service: Cardiovascular;  Laterality: N/A;  . LUMBAR SPINE SURGERY  2011  . POLYPECTOMY    . SPINAL CORD STIMULATOR IMPLANT  2012   x2 (Dr Berton Mount)  . TONSILLECTOMY  1996  . TUMOR REMOVAL Left 2007   schwannoma from L armpit  . UPPER GASTROINTESTINAL ENDOSCOPY       Prior to Admission medications   Medication Sig Start Date End Date Taking? Authorizing Provider  acetaminophen (TYLENOL) 325 MG tablet Take 650 mg by mouth every 6 (six) hours as needed for mild pain or headache.     [provider]  alendronate (FOSAMAX) 70 MG tablet Take 70 mg by mouth every Saturday.  01/11/18   [provider]  aspirin 81 MG EC tablet Take 1 tablet (81 mg total) by mouth daily. 02/25/18   Darreld Mclean, PA-C  atorvastatin (LIPITOR) 80 MG tablet Take 1 tablet (80 mg total) by mouth daily at 6 PM. 02/24/18   Darreld Mclean, PA-C  Calcium Carbonate-Vit D-Min (CALCIUM 1200 PO) Take 1 capsule by mouth 2 (two) times daily.    [provider]  carvedilol (COREG) 3.125 MG tablet Take 1 tablet (3.125 mg total) by mouth 2 (two) times daily with a meal. 02/24/18   Sande Rives E, PA-C  cetirizine (ZYRTEC) 10 MG tablet Take 10 mg by mouth daily.  07/27/17 07/27/18  [provider]  clopidogrel (PLAVIX) 75 MG tablet Take 1 tablet (75 mg total) by mouth daily. 02/24/18   Sande Rives E, PA-C  colchicine 0.6 MG tablet Take 1 tablet (0.6 mg total) by mouth daily. 03/04/18   Dunn, Nedra Hai, PA-C  diphenoxylate-atropine (LOMOTIL) 2.5-0.025 MG tablet Take 1-2 tablets by mouth 4 times  daily as needed for diarrhea    [provider]  furosemide (LASIX) 40 MG tablet Take 1 tablet (40 mg total) by mouth daily as needed for fluid or edema. 02/24/18 02/24/19  Sande Rives E, PA-C  gabapentin (NEURONTIN) 300 MG capsule Take 1 capsule (300 mg total) by mouth at bedtime as needed. 12/28/17   Thurman Coyer, DO  gabapentin (NEURONTIN) 300 MG capsule Take one tab QHS for 4 days, then one tab BID for 4 days, then one tab TID 03/02/18   Lilia Argue R, DO  hydrOXYzine (ATARAX/VISTARIL) 25 MG tablet TAKE 4 TABLETS (100 MG TOTAL) BY MOUTH AT BEDTIME. 01/26/18   [name], MD  losartan (COZAAR) 25 MG tablet Take 1 tablet (25 mg total) by mouth daily. 02/25/18   Darreld Mclean, PA-C  Multiple Vitamin (MULTIVITAMIN) capsule Take 1 capsule by mouth daily.    [provider]  nitroGLYCERIN (NITROSTAT) 0.4 MG SL tablet Place 1 tablet (0.4 mg total) under the tongue every 5 (five) minutes as  needed for chest pain. 02/24/18 02/24/19  Sande Rives E, PA-C  pantoprazole (PROTONIX) 40 MG tablet Take 1 tablet (40 mg total) by mouth daily. 02/24/18 02/24/19  Sande Rives E, PA-C  predniSONE (DELTASONE) 1 MG tablet Take by mouth See admin instructions. Take 34m with (1) 154mtablet to equal 1918my mouth once daily     [provider]  predniSONE (DELTASONE) 10 MG tablet Take 2 tablets (20 mg total) by mouth daily with breakfast. Patient taking differently: Take 18 mg by mouth See admin instructions. Take 47m50mth (9) 1mg 83mlets to equal 19mg 81mouth once daily 09/07/17   GutierRia Bushtamsulosin (FLOMAX) 0.4 MG CAPS capsule TAKE 1 CAPSULE (0.4 MG TOTAL) BY MOUTH DAILY. 04/29/17   GutierRia Bushvedolizumab (ENTYVIO) 300 MG injection Inject 300 mg into the vein every 8 (eight) weeks.     [provider]  warfarin (COUMADIN) 5 MG tablet Take 1.5 tablet tonight (02/24/2018) and 1 tablet tomorrow (02/25/2018). Will recheck INR on  Friday (02/25/2018). 02/24/18   GoodriDarreld Mclean     Allergies Cephalexin and Duloxetine   Family History  Problem Relation Age of Onset  . CAD Mother        CABG  . Congenital heart disease Mother   . Hypertension Mother   . Hyperlipidemia Mother   . CAD Father        CABG  . Hypertension Father   . Hyperlipidemia Father   . Cancer Maternal Grandmother        colon  . Colon cancer Maternal Grandfather   . Diabetes Neg Hx   . Colon polyps Neg Hx   . Esophageal cancer Neg Hx   . Rectal cancer Neg Hx   . Stomach cancer Neg Hx     Social History Social History   Tobacco Use  . Smoking status: Current Some Day Smoker    Types: Cigars  . Smokeless tobacco: Never Used  Substance Use Topics  . Alcohol use: Yes    Alcohol/week: 7.0 standard drinks    Types: 7 Cans of beer per week    Comment: 1 beer/day  . Drug use: No    Review of Systems  Constitutional:   No fever or chills.  ENT:   No sore throat. No rhinorrhea. Cardiovascular:   No chest pain or syncope. Respiratory:   No dyspnea or cough. Gastrointestinal:   Negative for abdominal pain, positive bloody diarrhea. Musculoskeletal:   Negative for focal pain or swelling All other systems reviewed and are negative except as documented above in ROS and HPI.  ____________________________________________   PHYSICAL EXAM:  VITAL SIGNS: ED Triage Vitals [03/06/18 1821]  Enc Vitals Group     BP (!) 131/94     Pulse Rate (!) 57     Resp 18     Temp      Temp src      SpO2 100 %     Weight      Height      Head Circumference      Peak Flow      Pain Score      Pain Loc      Pain Edu?      Excl. in GC?   [place] reviewed, nursing assessments reviewed.   Constitutional:   Alert and oriented. Non-toxic appearance. Eyes:   Conjunctivae are normal. EOMI. PERRL. ENT      Head:   Normocephalic  and atraumatic.      Nose:   No congestion/rhinnorhea.       Mouth/Throat:   MMM, no pharyngeal  erythema. No peritonsillar mass.       Neck:   No meningismus. Full ROM. Hematological/Lymphatic/Immunilogical:   No cervical lymphadenopathy. Cardiovascular:   RRR. Symmetric bilateral radial and DP pulses.  No murmurs. Cap refill less than 2 seconds. Respiratory:   Normal respiratory effort without tachypnea/retractions. Breath sounds are clear and equal bilaterally. No wheezes/rales/rhonchi. Gastrointestinal:   Soft and nontender. Non distended. There is no CVA tenderness.  No rebound, rigidity, or guarding.  Rectal exam reveals fresh red blood.  Musculoskeletal:   Normal range of motion in all extremities. No joint effusions.  No lower extremity tenderness.  No edema. Neurologic:   Normal speech and language.  Motor grossly intact. No acute focal neurologic deficits are appreciated.  Skin:    Skin is warm, dry and intact. No rash noted.  No petechiae, purpura, or bullae.  ____________________________________________    LABS (pertinent positives/negatives) (all labs ordered are listed, but only abnormal results are displayed) Labs Reviewed  COMPREHENSIVE METABOLIC PANEL - Abnormal; Notable for the following components:      Result Value   Glucose, Bld 110 (*)    BUN 30 (*)    Creatinine, Ser 1.26 (*)    Calcium 8.8 (*)    ALT 58 (*)    All other components within normal limits  CBC - Abnormal; Notable for the following components:   WBC 16.0 (*)    All other components within normal limits  PROTIME-INR - Abnormal; Notable for the following components:   Prothrombin Time 33.0 (*)    All other components within normal limits  POC OCCULT BLOOD, ED  TYPE AND SCREEN   ____________________________________________   EKG    ____________________________________________    RADIOLOGY  No results found.  ____________________________________________   PROCEDURES Procedures  ____________________________________________    CLINICAL IMPRESSION / ASSESSMENT AND PLAN /  ED COURSE  Pertinent labs & imaging results that were available during my care of the patient were reviewed by me and considered in my medical decision making (see chart for details).      Clinical Course as of Mar 07 1927  Sat Mar 06, 2018  8413 Patient presents with a lower GI bleed due to Crohn's flareup and complicated by Plavix and warfarin use that he takes for recent LAD stent placement complicated by thrombus in the LV.  Unable to reverse his anticoagulation due to the high risk of stroke.  Will need to continue anticoagulation, give steroid bolus for his Crohn's flareup, and admit for hemodynamic monitoring.   [PS]    Clinical Course User Index [PS] Carrie Mew, MD    ----------------------------------------- 7:30 PM on 03/06/2018 -----------------------------------------  Vital signs are unremarkable, stable blood pressure.  Hemoglobin is 14.  Does not need emergent resuscitation or blood transfusion.  With his anticoagulation which is essential to continue at this point, he will need to be closely monitored to ensure he does not have a massive GI bleed resulting in hemodynamic instability and shock while his Crohn's flareup is treated with steroids.  IV Flagyl may help so I will add that as well.   ____________________________________________   FINAL CLINICAL IMPRESSION(S) / ED DIAGNOSES    Final diagnoses:  Crohn's disease of colon with rectal bleeding (HCC)  Warfarin anticoagulation  LV (left ventricular) mural thrombus following MI [facility])     ED Discharge Orders  None      Portions of this note were generated with dragon dictation software. Dictation errors may occur despite best attempts at proofreading.    Carrie Mew, MD 03/06/18 [id number]

## 2018-03-06 NOTE — ED Notes (Signed)
Pt placed on cardiac monitor and given another warm blanket.

## 2018-03-06 NOTE — ED Triage Notes (Signed)
Pt to ed with blood in stool.  Pt states he is currently on plavix, coumadin, and asa post MI/PCI 2 weeks ago.

## 2018-03-07 ENCOUNTER — Other Ambulatory Visit: Payer: Self-pay

## 2018-03-07 DIAGNOSIS — I255 Ischemic cardiomyopathy: Secondary | ICD-10-CM

## 2018-03-07 DIAGNOSIS — I236 Thrombosis of atrium, auricular appendage, and ventricle as current complications following acute myocardial infarction: Secondary | ICD-10-CM

## 2018-03-07 DIAGNOSIS — K921 Melena: Secondary | ICD-10-CM

## 2018-03-07 DIAGNOSIS — I10 Essential (primary) hypertension: Secondary | ICD-10-CM

## 2018-03-07 DIAGNOSIS — I5021 Acute systolic (congestive) heart failure: Secondary | ICD-10-CM

## 2018-03-07 DIAGNOSIS — I2102 ST elevation (STEMI) myocardial infarction involving left anterior descending coronary artery: Secondary | ICD-10-CM

## 2018-03-07 DIAGNOSIS — K50811 Crohn's disease of both small and large intestine with rectal bleeding: Secondary | ICD-10-CM

## 2018-03-07 DIAGNOSIS — E785 Hyperlipidemia, unspecified: Secondary | ICD-10-CM

## 2018-03-07 DIAGNOSIS — Z7901 Long term (current) use of anticoagulants: Secondary | ICD-10-CM

## 2018-03-07 DIAGNOSIS — K922 Gastrointestinal hemorrhage, unspecified: Secondary | ICD-10-CM

## 2018-03-07 LAB — HEMOGLOBIN AND HEMATOCRIT, BLOOD
HCT: 40.1 % (ref 39.0–52.0)
HCT: 41.3 % (ref 39.0–52.0)
Hemoglobin: 12.9 g/dL — ABNORMAL LOW (ref 13.0–17.0)
Hemoglobin: 12.9 g/dL — ABNORMAL LOW (ref 13.0–17.0)

## 2018-03-07 LAB — CBC
HCT: 41.1 % (ref 39.0–52.0)
Hemoglobin: 13.4 g/dL (ref 13.0–17.0)
MCH: 29.2 pg (ref 26.0–34.0)
MCHC: 32.6 g/dL (ref 30.0–36.0)
MCV: 89.5 fL (ref 80.0–100.0)
Platelets: 324 10*3/uL (ref 150–400)
RBC: 4.59 MIL/uL (ref 4.22–5.81)
RDW: 13 % (ref 11.5–15.5)
WBC: 10.5 10*3/uL (ref 4.0–10.5)
nRBC: 0 % (ref 0.0–0.2)

## 2018-03-07 LAB — PROTIME-INR
INR: 2.96
Prothrombin Time: 30.4 seconds — ABNORMAL HIGH (ref 11.4–15.2)

## 2018-03-07 MED ORDER — MELATONIN 5 MG PO TABS
10.0000 mg | ORAL_TABLET | Freq: Every evening | ORAL | Status: DC | PRN
  Administered 2018-03-07: 10 mg via ORAL
  Filled 2018-03-07 (×2): qty 2

## 2018-03-07 MED ORDER — SODIUM CHLORIDE 0.9 % IV SOLN: 80.0000 mg | INTRAVENOUS | Status: DC

## 2018-03-07 MED ORDER — SODIUM CHLORIDE 0.9 % IV SOLN
8.0000 mg/h | INTRAVENOUS | Status: DC
  Administered 2018-03-07: 8 mg/h via INTRAVENOUS
  Filled 2018-03-07: qty 80

## 2018-03-07 MED ORDER — HYDROCORTISONE ACE-PRAMOXINE 1-1 % RE FOAM
1.0000 | Freq: Two times a day (BID) | RECTAL | Status: DC
  Filled 2018-03-07 (×3): qty 10

## 2018-03-07 MED ORDER — SODIUM CHLORIDE 0.9 % IV SOLN
80.0000 mg | Freq: Once | INTRAVENOUS | Status: AC
  Filled 2018-03-07: qty 80

## 2018-03-07 NOTE — Consult Note (Signed)
Cephas Darby, MD [address]  [place]  [place], [place] [address]  Main: [phone]  Fax: [phone] Pager: [phone]   Consultation  Referring Provider:     No ref. provider found Primary Care Physician:  [name], MD Primary Gastroenterologist:  Dr. Zenovia Jarred         Reason for Consultation:     Rectal bleeding  Date of Admission:  03/06/2018 Date of Consultation:  03/07/2018         HPI:   Jack [name] is a 52 y.o. Caucasian Caucasian male with > 25 years history of ileocolonic Crohn's disease, currently maintained on Entyvio and azathioprine, chronic eosinophilic pneumonitis, recent STEMI with post MI pericarditis and LV thrombus about 2 weeks ago status post PCI on DAPT and Coumadin, has been on long-term prednisone with slow taper presented to ER yesterday as he has been noticing blood in the stool since Friday.  The episodes are mostly bloody, maroon-colored without any abdominal pain or diarrhea. He reports having 3-4 episodes so far, last one this morning with less amount of blood.  However, his nurse just told me that he just had another episode which was little bit more than this morning and it was maroon-colored.  Patient has been empirically started on pantoprazole drip.  Coumadin has been held as INR was 3.29 on admission.  It is 2.96 today.  Cardiology held his aspirin and continued Plavix.  His hemoglobin on admission was 14.3, dropped to 12.9 today.  BUN/creatinine ratio is 22, BUN and creatinine both are elevated.  Patient has been hemodynamically stable in the ER.  He denies chest pain, shortness of breath, dizziness or lightheadedness, epigastric pain or discomfort.  He does not think these episodes are similar to when he had Crohn's flareup in the past.  With regards to Crohn's disease, patient has been receiving Entyvio every 8 weeks and he was supposed to undergo colonoscopy for response to the therapy 2 weeks ago and it was canceled  due to STEMI.  Patient reports that his Crohn's disease has been in clinical remission on Entyvio.  He received Entyvio reinduction in 2018 followed by maintenance every 8 weeks as well as 6-MP 100 mg daily.  He has been on chronic steroids for eosinophilic pneumonitis and his pulmonologist has been attempting to a wean his prednisone down.  Patient denies similar episodes in the recent past.  He is not sure if he takes proton pump inhibitor as outpatient.  He reports that his wife fixes all his medications for him.   NSAIDs: None  Antiplts/Anticoagulants/Anti thrombotics: Aspirin and Plavix, Coumadin for recent MI and LV thrombus  GI Procedures: Colonoscopy 09/27/2014 - Exuberant but not indurated anal cuffing found on             perianal exam.            - Granularity in the distal ileum.            - Exudate colon.            - Friability with contact bleeding in the sigmoid colon.            - Inflammation was found from the sigmoid colon to the             hepatic flexure secondary to Crohn's disease with             colonic involvement. Biopsied.            -  Three 19 to 30 mm polyps in the sigmoid colon, in the             distal sigmoid colon, in the descending colon, in the             transverse colon and in the mid transverse colon.             Resected and retrieved.            - Perianal Crohn's disease.            - The findings are consistent with moderately severe,             steroid dependent Corhn's disease.  A. Terminal ileum, biopsy: -Mild active ileitis and mild crypt architectural distortion.  B. Colon, ascending, biopsy: -Chronic moderately active colitis. -Few small lymphohistiocytic aggregates present.  C. Colon, transverse, biopsy: -Chronic severely active colitis with surface erosion.  D. Colon,  transverse, pseudopolyp: -Fragments of inflammatory pseudopolyp.  E. Colon, descending, biopsy: -Chronic severely active colitis with focal surface erosion.  F. Colon, sigmoid, biopsy: -Chronic severely active colitis with surface erosion.  G. Rectum, biopsy: -Chronic severely active colitis with surface erosion and granulation tissue.  H. Rectum, polyp: -Fragments of inflammatory pseudopolyp.  Past Medical History:  Diagnosis Date  . BPH (benign prostatic hypertrophy)   . CAD (coronary artery disease)    a. anterior STEMI 01/2018 -  proximal occlusion of LAD, treated with DES. Cath also showed 20% distal LM, 95% ostial-prox small-moderate ramus, 70-80% ostial Cx, 70% dominant ostial OM, 50-60% prox Cx, 70% RCA. EF 35% by cath with LVEDP 16mHg. Med rx for residual disease. Course complicated by post MI pericarditis and LV thrombus.  . Chronic systolic [place]congestive) heart failure ([facility]   . Colitis   . Colon polyp    inflammatory  . Cro[place] disease ([facility] [place]   history uveitis, involvement of intestines and lungs  . Diverticulosis   . Eosinophilic esophagitis   . Essential hypertension   . GERD (gastroesophageal reflux disease)   . History of chicken pox   . History of gastroesophageal reflux (GERD)   . History of seizure 1995   grand mal x1, completed 6 yrs dilantin. no seizures since  . Hyperlipidemia   . Internal hemorrhoids   . Ischemic cardiomyopathy   . LV (left ventricular) mural thrombus following MI ([facility] 01/2018  . Osteoporosis 11/2015   DEXA T -2.9  . Schwannoma 2007   L axilla s/p surgery  . [facility] [facility]    last seizure 1995 and only x 1 seizure-   . Ulnar neuropathy    h/o this from L arm schwannoma    Past Surgical History:  Procedure Laterality Date  . APPENDECTOMY  2000  . CARDIOVASCULAR STRESS TEST  01/2015   low risk study  . CHOLECYSTECTOMY  2000  . COLONOSCOPY  08/2014   f/u crohn's, 4 inflammatory polyps, diverticulosis, improved, rpt 2 yrs  ([place])  . COLONOSCOPY  01/2015   during hospitalization - active ileitis/colitis  . CORONARY ANGIOGRAPHY N/A 02/21/2018   Procedure: CORONARY ANGIOGRAPHY;  Surgeon: SBelva Crome MD;  [facility];  Service: Cardiovascular;  Laterality: N/A;  . CORONARY/GRAFT ACUTE MI REVASCULARIZATION N/A 02/20/2018   Procedure: Coronary/Graft Acute MI Revascularization;  Surgeon: SBelva Crome MD;  [facility];  Service: Cardiovascular;  Laterality: N/A;  . ESOPHAGOGASTRODUODENOSCOPY  07/2014   h/o EE resolved, focal reflux esophagitis, chronic active gastritis  . extremity  surgery Left    ulnar nerve repair after schwannoma removal  . INGUINAL HERNIA REPAIR Bilateral 2017  . LEFT HEART CATH AND CORONARY ANGIOGRAPHY N/A 02/20/2018   Procedure: LEFT HEART CATH AND CORONARY ANGIOGRAPHY;  Surgeon: Belva Crome, MD;  Location: [facility];  Service: Cardiovascular;  Laterality: N/A;  . LUMBAR SPINE SURGERY  2011  . POLYPECTOMY    . SPINAL CORD STIMULATOR IMPLANT  2012   x2 (Dr Berton Mount)  . TONSILLECTOMY  1996  . TUMOR REMOVAL Left 2007   schwannoma from L armpit  . UPPER GASTROINTESTINAL ENDOSCOPY      Prior to Admission medications   Medication Sig Start Date End Date Taking? Authorizing Provider  acetaminophen (TYLENOL) 325 MG tablet Take 650 mg by mouth every 6 (six) hours as needed for mild pain or headache.    Yes [provider]  alendronate (FOSAMAX) 70 MG tablet Take 70 mg by mouth every Saturday.  01/11/18  Yes [provider]  aspirin 81 MG EC tablet Take 1 tablet (81 mg total) by mouth daily. 02/25/18  Yes Sande Rives E, PA-C  atorvastatin (LIPITOR) 80 MG tablet Take 1 tablet (80 mg total) by mouth daily at 6 PM. 02/24/18  Yes Sande Rives E, PA-C  Calcium Carbonate-Vit D-Min (CALCIUM 1200 PO) Take 1 capsule by mouth 2 (two) times daily.   Yes [provider]  carvedilol (COREG) 3.125 MG tablet Take 1 tablet  (3.125 mg total) by mouth 2 (two) times daily with a meal. 02/24/18  Yes Sande Rives E, PA-C  cetirizine (ZYRTEC) 10 MG tablet Take 10 mg by mouth daily.  07/27/17 07/27/18 Yes [provider]  clopidogrel (PLAVIX) 75 MG tablet Take 1 tablet (75 mg total) by mouth daily. 02/24/18  Yes Sande Rives E, PA-C  colchicine 0.6 MG tablet Take 1 tablet (0.6 mg total) by mouth daily. 03/04/18  Yes Dunn, Dayna N, PA-C  diphenoxylate-atropine (LOMOTIL) 2.5-0.025 MG tablet Take 1-2 tablets by mouth 4 times daily as needed for diarrhea   Yes [provider]  furosemide (LASIX) 40 MG tablet Take 1 tablet (40 mg total) by mouth daily as needed for fluid or edema. 02/24/18 02/24/19 Yes Sande Rives E, PA-C  gabapentin (NEURONTIN) 300 MG capsule Take one tab QHS for 4 days, then one tab BID for 4 days, then one tab TID Patient taking differently: 600 mg. one tab BID for 4 days, then one tab TID 03/02/18  Yes Draper, Timothy R, DO  hydrOXYzine (ATARAX/VISTARIL) 25 MG tablet TAKE 4 TABLETS (100 MG TOTAL) BY MOUTH AT BEDTIME. 01/26/18  Yes [name], MD  Multiple Vitamin (MULTIVITAMIN) capsule Take 1 capsule by mouth daily.   Yes [provider]  nitroGLYCERIN (NITROSTAT) 0.4 MG SL tablet Place 1 tablet (0.4 mg total) under the tongue every 5 (five) minutes as needed for chest pain. 02/24/18 02/24/19 Yes Sande Rives E, PA-C  pantoprazole (PROTONIX) 40 MG tablet Take 1 tablet (40 mg total) by mouth daily. 02/24/18 02/24/19 Yes Sande Rives E, PA-C  predniSONE (DELTASONE) 10 MG tablet Take 2 tablets (20 mg total) by mouth daily with breakfast. Patient taking differently: Take 18 mg by mouth See admin instructions. Take 55m with (8) 112mtablets to equal 18 mg by mouth once daily 09/07/17  Yes GuRia BushMD  tamsulosin (FLOMAX) 0.4 MG CAPS capsule TAKE 1 CAPSULE (0.4 MG TOTAL) BY MOUTH DAILY. 04/29/17  Yes GuRia BushMD  warfarin (COUMADIN) 5 MG tablet Take  1.5 tablet tonight (02/24/2018) and 1 tablet tomorrow (02/25/2018). Will recheck INR on Friday (02/25/2018). Patient taking differently: 5 mg daily.  02/24/18  Yes Sande Rives E, PA-C  losartan (COZAAR) 25 MG tablet Take 1 tablet (25 mg total) by mouth daily. 02/25/18   Sande Rives E, PA-C  predniSONE (DELTASONE) 1 MG tablet Take by mouth See admin instructions. Take 24m with (1) 169mtablet to equal 1992my mouth once daily     [provider]  vedolizumab (ENTYVIO) 300 MG injection Inject 300 mg into the vein every 8 (eight) weeks.     [provider]    Current Facility-Administered Medications:  .  0.9 %  sodium chloride infusion, 250 mL, Intravenous, PRN, Salary, Montell D, MD .  0.9 %  sodium chloride infusion, , Intravenous, Continuous, Salary, Montell D, MD, Last Rate: 50 mL/hr at 03/07/18 0300 .  acetaminophen (TYLENOL) tablet 650 mg, 650 mg, Oral, Q6H PRN **OR** acetaminophen (TYLENOL) suppository 650 mg, 650 mg, Rectal, Q6H PRN, Salary, Montell D, MD .  atorvastatin (LIPITOR) tablet 80 mg, 80 mg, Oral, q1800, Salary, Montell D, MD .  calcium carbonate (TUMS - dosed in mg elemental calcium) chewable tablet 400 mg of elemental calcium, 2 tablet, Oral, BID, Salary, Montell D, MD, 400 mg of elemental calcium at 03/07/18 0941 .  carvedilol (COREG) tablet 3.125 mg, 3.125 mg, Oral, BID WC, Salary, Montell D, MD, 3.125 mg at 03/07/18 0941 .  clopidogrel (PLAVIX) tablet 75 mg, 75 mg, Oral, Daily, Salary, Montell D, MD, 75 mg at 03/07/18 0941 .  colchicine tablet 0.6 mg, 0.6 mg, Oral, Daily, Salary, Montell D, MD, 0.6 mg at 03/07/18 0941 .  diphenoxylate-atropine (LOMOTIL) 2.5-0.025 MG per tablet 1 tablet, 1 tablet, Oral, QID PRN, Salary, Montell D, MD .  furosemide (LASIX) tablet 40 mg, 40 mg, Oral, Daily PRN, Salary, Montell D, MD .  gabapentin (NEURONTIN) capsule 300 mg, 300 mg, Oral, QHS, Salary, Montell D, MD, 300 mg at 03/06/18 2327 .  hydrocortisone-pramoxine  (PROCTOFOAM-HC) rectal foam 1 applicator, 1 applicator, Rectal, BID, Gualberto [place], RohTally DueD .  hydrOXYzine (ATARAX/VISTARIL) tablet 100 mg, 100 mg, Oral, QHS, Salary, Montell D, MD, 100 mg at 03/06/18 2327 .  loratadine (CLARITIN) tablet 10 mg, 10 mg, Oral, Daily, Salary, Montell D, MD, 10 mg at 03/07/18 0941 .  losartan (COZAAR) tablet 25 mg, 25 mg, Oral, Daily, Salary, Montell D, MD .  multivitamin with minerals tablet 1 tablet, 1 tablet, Oral, Daily, Salary, Montell D, MD, 1 tablet at 03/07/18 0944 .  nitroGLYCERIN (NITROSTAT) SL tablet 0.4 mg, 0.4 mg, Sublingual, Q5 min PRN, Salary, Montell D, MD .  ondansetron (ZOFRAN) tablet 4 mg, 4 mg, Oral, Q6H PRN **OR** ondansetron (ZOFRAN) injection 4 mg, 4 mg, Intravenous, Q6H PRN, Salary, Montell D, MD .  [STDerrill Memo 03/10/2018] pantoprazole (PROTONIX) injection 40 mg, 40 mg, Intravenous, Q12H, Salary, Montell D, MD .  predniSONE (DELTASONE) tablet 18 mg, 18 mg, Oral, See admin instructions, Salary, Montell D, MD, 18 mg at 03/07/18 0943 .  sodium chloride flush (NS) 0.9 % injection 3 mL, 3 mL, Intravenous, Q12H, Salary, Montell D, MD, 3 mL at 03/06/18 2336 .  sodium chloride flush (NS) 0.9 % injection 3 mL, 3 mL, Intravenous, PRN, Salary, Montell D, MD .  tamsulosin (FLOMAX) capsule 0.4 mg, 0.4 mg, Oral, Daily, Salary, Montell D, MD, 0.4 mg at 03/07/18 0941  Family History  Problem Relation Age of Onset  . CAD Mother  CABG  . Congenital heart disease Mother   . Hypertension Mother   . Hyperlipidemia Mother   . CAD Father        CABG  . Hypertension Father   . Hyperlipidemia Father   . Cancer Maternal Grandmother        colon  . Colon cancer Maternal Grandfather   . Diabetes Neg Hx   . Colon polyps Neg Hx   . Esophageal cancer Neg Hx   . Rectal cancer Neg Hx   . Stomach cancer Neg Hx      Social History   Tobacco Use  . Smoking status: Current Some Day Smoker    Types: Cigars  . Smokeless tobacco: Never Used  Substance Use  Topics  . Alcohol use: Yes    Alcohol/week: 7.0 standard drinks    Types: 7 Cans of beer per week    Comment: 1 beer/day  . Drug use: No    Allergies as of 03/06/2018 - Review Complete 03/06/2018  Allergen Reaction Noted  . Cephalexin Nausea And Vomiting 01/28/2014  . Duloxetine Other (See Comments) 01/28/2014    Review of Systems:    All systems reviewed and negative except where noted in HPI.   Physical Exam:  Vital signs in last 24 hours: Temp:  [97.3 F (36.3 C)-98.4 F (36.9 C)] 97.8 F (36.6 C) (12/08 0728) Pulse Rate:  [57-66] 61 (12/08 0728) Resp:  [16-26] 20 (12/08 0728) BP: (110-141)/(73-97) 112/73 (12/08 0728) SpO2:  [86 %-100 %] 97 % (12/08 0728) Weight:  [74.1 kg-75 kg] 75 kg (12/08 0510) Last BM Date: 03/06/18 General:   Pleasant, cooperative in NAD Head:  Normocephalic and atraumatic. Eyes:   No icterus.   Conjunctiva pink. PERRLA. Ears:  Normal auditory acuity. Neck:  Supple; no masses or thyroidomegaly Lungs: Respirations even and unlabored. Lungs clear to auscultation bilaterally.   No wheezes, crackles, or rhonchi.  Heart:  Regular rate and rhythm;  Without murmur, clicks, rubs or gallops Abdomen:  Soft, nondistended, nontender. Normal bowel sounds. No appreciable masses or hepatomegaly.  No rebound or guarding.  Rectal:  Not performed. Msk:  Symmetrical without gross deformities.  Strength normal Extremities:  Without edema, cyanosis or clubbing. Neurologic:  Alert and oriented x3;  grossly normal neurologically. Skin:  Intact without significant lesions or rashes. Cervical Nodes:  No significant cervical adenopathy. Psych:  Alert and cooperative. Normal affect.  LAB RESULTS: CBC Latest Ref Rng & Units 03/07/2018 03/07/2018 03/06/2018  WBC 4.0 - 10.5 K/uL - 10.5 -  Hemoglobin 13.0 - 17.0 g/dL 12.9(L) 13.4 14.0  Hematocrit 39.0 - 52.0 % 40.1 41.1 43.2  Platelets 150 - 400 K/uL - 324 -    BMET BMP Latest Ref Rng & Units 03/06/2018 03/04/2018  02/22/2018  Glucose 70 - 99 mg/dL 110(H) 98 126(H)  BUN 6 - 20 mg/dL 30(H) 35(H) 13  Creatinine 0.61 - 1.24 mg/dL 1.26(H) 1.59(H) 1.07  BUN/Creat Ratio 9 - 20 - 22(H) -  Sodium 135 - 145 mmol/L 140 138 135  Potassium 3.5 - 5.1 mmol/L 4.1 4.8 5.1  Chloride 98 - 111 mmol/L 101 94(L) 107  CO2 22 - 32 mmol/L 30 28 21(L)  Calcium 8.9 - 10.3 mg/dL 8.8(L) 10.3(H) 8.8(L)    LFT Hepatic Function Latest Ref Rng & Units 03/06/2018 02/20/2018 12/03/2016  Total Protein 6.5 - 8.1 g/dL 6.6 6.7 6.7  Albumin 3.5 - 5.0 g/dL 3.8 3.5 4.2  AST 15 - 41 U/L 26 21 13   ALT 0 -  44 U/L 58(H) 19 16  Alk Phosphatase 38 - 126 U/L 58 56 39  Total Bilirubin 0.3 - 1.2 mg/dL 0.8 0.7 0.9     STUDIES: No results found.    Impression / Plan:   RYSZARD SOCARRAS is a 52 y.o. Caucasian male with chronic eosinophilic pneumonitis, steroid dependent, ileocolonic Crohn's disease, non-stricturing nonpenetrating phenotype, currently maintained on 6-MP and Entyvio every 8 weeks, recent STEMI complicated by post MI pericarditis as well as LV thrombus, on anticoagulation with DAPT, Coumadin admitted with 3 days history of painless hematochezia in setting of supratherapeutic INR.  Hematochezia: Likely lower GI bleed.  Does not appear to be Crohn's flareup based on patient's clinical presentation. However, patient is at risk for peptic ulcer disease or erosive gastritis given long-term use of prednisone and recent start of triple anticoagulation and not on proton pump inhibitor Continue pantoprazole drip, transition to Protonix 40 mg twice daily long-term Monitor CBC  Aspirin and Coumadin has been held by cardiology, currently on Plavix only Daily INR Start Proctofoam enema twice daily Okay to continue diet Do not recommend any endoscopic evaluation given recent MI unless patient develops severe GI bleed.  Hopefully, bleeding will subside as INR becomes therapeutic  Thank you for involving me in the care of this patient.  Will  follow along with you  Dr. Vicente Males to cover from tomorrow    LOS: 1 day   Sherri Sear, MD  03/07/2018, 2:52 PM   Note: This dictation was prepared with Dragon dictation along with smaller phrase technology. Any transcriptional errors that result from this process are unintentional.

## 2018-03-07 NOTE — Progress Notes (Signed)
[facility] [place] NAME: Jack [name]    MR#:  [id number]  DATE OF BIRTH:  1965/12/14  SUBJECTIVE:   Patient's last bowel movement was yesterday when he was admitted he has not had a bowel movement this morning.  He is hungry and wants to eat.  Denies abdominal pain, nausea or vomiting.  REVIEW OF SYSTEMS:     Review of Systems  Constitutional: Negative for fever, chills weight loss HENT: Negative for ear pain, nosebleeds, congestion, facial swelling, rhinorrhea, neck pain, neck stiffness and ear discharge.   Respiratory: Negative for cough, shortness of breath, wheezing  Cardiovascular: Negative for chest pain, palpitations and leg swelling.  Gastrointestinal: Negative for heartburn, abdominal pain, vomiting, diarrhea or consitpation Genitourinary: Negative for dysuria, urgency, frequency, hematuria Musculoskeletal: Negative for back pain or joint pain Neurological: Negative for dizziness, seizures, syncope, focal weakness,  numbness and headaches.  Hematological: Does not bruise/bleed easily.  Psychiatric/Behavioral: Negative for hallucinations, confusion, dysphoric mood    Tolerating Diet:yes      DRUG ALLERGIES:   Allergies  Allergen Reactions  . Cephalexin Nausea And Vomiting    Other Reaction: GI UPSET  . Duloxetine Other (See Comments)    Headaches     VITALS:  Blood pressure 112/73, pulse 61, temperature 97.8 F (36.6 C), temperature source Oral, resp. rate 20, height 5' 7"  (1.702 m), weight 75 kg, SpO2 97 %.  PHYSICAL EXAMINATION:  Constitutional: Appears well-developed and well-nourished. No distress. HENT: Normocephalic. [name] Oropharynx is clear and moist.  Eyes: Conjunctivae and EOM are normal. PERRLA, no scleral icterus.  Neck: Normal ROM. Neck supple. No JVD. No tracheal deviation. CVS: RRR, S1/S2 +, no murmurs, no gallops, no carotid bruit.  Pulmonary: Effort and breath sounds normal, no stridor, rhonchi,  wheezes, rales.  Abdominal: Soft. BS +,  no distension, tenderness, rebound or guarding.  Musculoskeletal: Normal range of motion. No edema and no tenderness.  Neuro: Alert. CN 2-12 grossly intact. No focal deficits. Skin: Skin is warm and dry. No rash noted. Psychiatric: Normal mood and affect.      LABORATORY PANEL:   CBC Recent Labs  Lab 03/07/18 0450  WBC 10.5  HGB 13.4  HCT 41.1  PLT 324   ------------------------------------------------------------------------------------------------------------------  Chemistries  Recent Labs  Lab 03/04/18 1033 03/06/18 1439  NA 138 140  K 4.8 4.1  CL 94* 101  CO2 28 30  GLUCOSE 98 110*  BUN 35* 30*  CREATININE 1.59* 1.26*  CALCIUM 10.3* 8.8*  MG 2.0  --   AST  --  26  ALT  --  58*  ALKPHOS  --  58  BILITOT  --  0.8   ------------------------------------------------------------------------------------------------------------------  Cardiac Enzymes No results for input(s): TROPONINI in the last 168 hours. ------------------------------------------------------------------------------------------------------------------  RADIOLOGY:  No results found.   ASSESSMENT AND PLAN:   52 year old male with history of systemic vasculitis, eosinophilic esophagitis, Crohn's disease on chronic prednisone, CAD with recent anterior STEMI and ischemic cardiomyopathy with LV thrombus on Coumadin and post MI pericarditis who presented with rectal bleeding to the emergency room.  1.  Rectal bleeding: Patient is now on heparin drip as advised by cardiology.  Coumadin is on hold.  Aspirin has been discontinued. Patient will need to continue Plavix for the recent stent was placed. Continue PPI Case discussed with GI Hemoglobin is stable  2.  Recent STEMI and post MI pericarditis: Patient will continue on Plavix as per recommendations by cardiology due to recent  stent. Continue Coreg, losartan and Lipitor.   3.  Post MI pericarditis:  Patient is on colchicine and prednisone  4.  Ischemic cardiomyopathy with chronic systolic heart failure EF 35%: Patient is euvolemic. Continue Coreg and losartan Patient was discharged with LifeVest  5.  Crohn's disease: Patient is on prednisone Management as per GI.  6.  BPH: Continue Flomax  7.  LV thrombus: Coumadin on hold for now  Management plans discussed with the patient and he is in agreement.  CODE STATUS: full  TOTAL TIME TAKING CARE OF THIS PATIENT: 30 minutes.     POSSIBLE D/C 3-5 days, DEPENDING ON CLINICAL CONDITION.   Kinnedy Mongiello M.D on 03/07/2018 at 11:17 AM  Between 7am to 6pm - Pager - [phone] After 6pm go to [email or web address] - password [email or web address] [facility]  Office  [phone]  CC: Primary care physician; [name], MD  Note: This dictation was prepared with Dragon dictation along with smaller phrase technology. Any transcriptional errors that result from this process are unintentional.

## 2018-03-07 NOTE — Consult Note (Addendum)
Cardiology Consultation:   Patient ID: Jack [name] MRN: [id number]; DOB: 03-Nov-1965  Admit date: 03/06/2018 Date of Consult: 03/07/2018  Primary Care Provider: [name], MD Primary Cardiologist: Sinclair Grooms, MD  Primary Electrophysiologist:  None    Patient Profile:   Jack [name] is a 52 y.o. male with a hx of systemic vasculitis, eosinophilic esophagitis, Crohn's disease, essential hypertension, sciatica and recent complex admission for CAD/anterior STEMI with acute systolic CHF/ischemic cardiomyopathy, LV thrombus, post-MI pericarditis who is being seen today for the evaluation of rectal bleeding in the setting of anticoagulation and antiplatelet therapy at the request of Dr. Jerelyn Charles.  History of Present Illness:   Jack [name] was in his usual state of health until 2 days ago when he noticed several bloody bowel movements.  He also noted some rumbling in his abdomen, though he has experienced this in the past with his Crohn's disease.  He had at least 1 more bloody bowel movement yesterday, prompting him to come to the ED.  He has not had any chest pain, shortness of breath, palpitations, lightheadedness, orthopnea, and edema.  He has been compliant with his medications, including aspirin, clopidogrel, and warfarin.  He was last seen in our [address] office 3 days ago, at which time he was doing well.  The plan was to complete triple therapy through 12/24, after which aspirin would be stopped.  In the ED, the patient was placed on a pantoprazole infusion.  Serial hemoglobin checks have shown minimal downward trend (14.3->14.0->13.4).  Jack [name] had one further bloody bowel movement after coming to his room overnight.  Past Medical History:  Diagnosis Date  . BPH (benign prostatic hypertrophy)   . CAD (coronary artery disease)    a. anterior STEMI 01/2018 -  proximal occlusion of LAD, treated with DES. Cath also showed 20% distal LM, 95% ostial-prox  small-moderate ramus, 70-80% ostial Cx, 70% dominant ostial OM, 50-60% prox Cx, 70% RCA. EF 35% by cath with LVEDP 63mHg. Med rx for residual disease. Course complicated by post MI pericarditis and LV thrombus.  . Chronic systolic (congestive) heart failure ([facility]   . Colitis   . Colon polyp    inflammatory  . Crohn disease ([place] 1992   history uveitis, involvement of intestines and lungs  . Diverticulosis   . Eosinophilic esophagitis   . Essential hypertension   . GERD (gastroesophageal reflux disease)   . History of chicken pox   . History of gastroesophageal reflux (GERD)   . History of seizure 1995   grand mal x1, completed 6 yrs dilantin. no seizures since  . Hyperlipidemia   . Internal hemorrhoids   . Ischemic cardiomyopathy   . LV (left ventricular) mural thrombus following MI ([facility] 01/2018  . Osteoporosis 11/2015   DEXA T -2.9  . Schwannoma 2007   L axilla s/p surgery  . Seizures ([facility]    last seizure 1995 and only x 1 seizure-   . Ulnar neuropathy    h/o this from L arm schwannoma    Past Surgical History:  Procedure Laterality Date  . APPENDECTOMY  2000  . CARDIOVASCULAR STRESS TEST  01/2015   low risk study  . CHOLECYSTECTOMY  2000  . COLONOSCOPY  08/2014   f/u crohn's, 4 inflammatory polyps, diverticulosis, improved, rpt 2 yrs ([place])  . COLONOSCOPY  01/2015   during hospitalization - active ileitis/colitis  . CORONARY ANGIOGRAPHY N/A 02/21/2018   Procedure: CORONARY ANGIOGRAPHY;  Surgeon: SBelva Crome MD;  Location: [facility];  Service: Cardiovascular;  Laterality: N/A;  . CORONARY/GRAFT ACUTE MI REVASCULARIZATION N/A 02/20/2018   Procedure: Coronary/Graft Acute MI Revascularization;  Surgeon: Belva Crome, MD;  Location: [facility];  Service: Cardiovascular;  Laterality: N/A;  . ESOPHAGOGASTRODUODENOSCOPY  07/2014   h/o EE resolved, focal reflux esophagitis, chronic active gastritis  . extremity surgery Left    ulnar nerve repair  after schwannoma removal  . INGUINAL HERNIA REPAIR Bilateral 2017  . LEFT HEART CATH AND CORONARY ANGIOGRAPHY N/A 02/20/2018   Procedure: LEFT HEART CATH AND CORONARY ANGIOGRAPHY;  Surgeon: Belva Crome, MD;  Location: [facility];  Service: Cardiovascular;  Laterality: N/A;  . LUMBAR SPINE SURGERY  2011  . POLYPECTOMY    . SPINAL CORD STIMULATOR IMPLANT  2012   x2 (Dr Berton Mount)  . TONSILLECTOMY  1996  . TUMOR REMOVAL Left 2007   schwannoma from L armpit  . UPPER GASTROINTESTINAL ENDOSCOPY       Home Medications:  Prior to Admission medications   Medication Sig Start Date [place] Bink Date Taking? Authorizing Provider  acetaminophen (TYLENOL) 325 MG tablet Take 650 mg by mouth every 6 (six) hours as needed for mild pain or headache.    Yes [provider]  alendronate (FOSAMAX) 70 MG tablet Take 70 mg by mouth every Saturday.  01/11/18  Yes [provider]  aspirin 81 MG EC tablet Take 1 tablet (81 mg total) by mouth daily. 02/25/18  Yes Sande Rives E, PA-C  atorvastatin (LIPITOR) 80 MG tablet Take 1 tablet (80 mg total) by mouth daily at 6 PM. 02/24/18  Yes Sande Rives E, PA-C  Calcium Carbonate-Vit D-Min (CALCIUM 1200 PO) Take 1 capsule by mouth 2 (two) times daily.   Yes [provider]  carvedilol (COREG) 3.125 MG tablet Take 1 tablet (3.125 mg total) by mouth 2 (two) times daily with a meal. 02/24/18  Yes Sande Rives E, PA-C  cetirizine (ZYRTEC) 10 MG tablet Take 10 mg by mouth daily.  07/27/17 07/27/18 Yes [provider]  clopidogrel (PLAVIX) 75 MG tablet Take 1 tablet (75 mg total) by mouth daily. 02/24/18  Yes Sande Rives E, PA-C  colchicine 0.6 MG tablet Take 1 tablet (0.6 mg total) by mouth daily. 03/04/18  Yes Dunn, Dayna N, PA-C  diphenoxylate-atropine (LOMOTIL) 2.5-0.025 MG tablet Take 1-2 tablets by mouth 4 times daily as needed for diarrhea   Yes [provider]  furosemide (LASIX) 40 MG tablet Take 1 tablet (40 mg  total) by mouth daily as needed for fluid or edema. 02/24/18 02/24/19 Yes Sande Rives E, PA-C  gabapentin (NEURONTIN) 300 MG capsule Take one tab QHS for 4 days, then one tab BID for 4 days, then one tab TID Patient taking differently: 600 mg. one tab BID for 4 days, then one tab TID 03/02/18  Yes Draper, Timothy R, DO  hydrOXYzine (ATARAX/VISTARIL) 25 MG tablet TAKE 4 TABLETS (100 MG TOTAL) BY MOUTH AT BEDTIME. 01/26/18  Yes [name], MD  Multiple Vitamin (MULTIVITAMIN) capsule Take 1 capsule by mouth daily.   Yes [provider]  nitroGLYCERIN (NITROSTAT) 0.4 MG SL tablet Place 1 tablet (0.4 mg total) under the tongue every 5 (five) minutes as needed for chest pain. 02/24/18 02/24/19 Yes Sande Rives E, PA-C  pantoprazole (PROTONIX) 40 MG tablet Take 1 tablet (40 mg total) by mouth daily. 02/24/18 02/24/19 Yes Goodrich, Callie E, PA-C  predniSONE (DELTASONE) 10 MG tablet Take 2 tablets (20  mg total) by mouth daily with breakfast. Patient taking differently: Take 18 mg by mouth See admin instructions. Take 81m with (8) 168mtablets to equal 18 mg by mouth once daily 09/07/17  Yes GuRia BushMD  tamsulosin (FLOMAX) 0.4 MG CAPS capsule TAKE 1 CAPSULE (0.4 MG TOTAL) BY MOUTH DAILY. 04/29/17  Yes GuRia BushMD  warfarin (COUMADIN) 5 MG tablet Take 1.5 tablet tonight (02/24/2018) and 1 tablet tomorrow (02/25/2018). Will recheck INR on Friday (02/25/2018). Patient taking differently: 5 mg daily.  02/24/18  Yes GoSande Rives, PA-C  losartan (COZAAR) 25 MG tablet Take 1 tablet (25 mg total) by mouth daily. 02/25/18   GoSande Rives, PA-C  predniSONE (DELTASONE) 1 MG tablet Take by mouth See admin instructions. Take 32m30mith (1) 73m36mblet to equal 132mg61mmouth once daily     [provider]  vedolizumab (ENTYVIO) 300 MG injection Inject 300 mg into the vein every 8 (eight) weeks.     [provider]    Inpatient Medications: Scheduled  Meds: . aspirin EC  81 mg Oral Daily  . atorvastatin  80 mg Oral q1800  . calcium carbonate  2 tablet Oral BID  . carvedilol  3.125 mg Oral BID WC  . clopidogrel  75 mg Oral Daily  . colchicine  0.6 mg Oral Daily  . gabapentin  300 mg Oral QHS  . hydrOXYzine  100 mg Oral QHS  . loratadine  10 mg Oral Daily  . losartan  25 mg Oral Daily  . multivitamin with minerals  1 tablet Oral Daily  . [START ON 03/10/2018] pantoprazole  40 mg Intravenous Q12H  . predniSONE  18 mg Oral See admin instructions  . sodium chloride flush  3 mL Intravenous Q12H  . tamsulosin  0.4 mg Oral Daily   Continuous Infusions: . sodium chloride    . sodium chloride 50 mL/hr at 03/07/18 0300  . pantoprozole (PROTONIX) infusion 8 mg/hr (03/07/18 0300)   PRN Meds: sodium chloride, acetaminophen **OR** acetaminophen, diphenoxylate-atropine, furosemide, nitroGLYCERIN, ondansetron **OR** ondansetron (ZOFRAN) IV, sodium chloride flush  Allergies:    Allergies  Allergen Reactions  . Cephalexin Nausea And Vomiting    Other Reaction: GI UPSET  . Duloxetine Other (See Comments)    Headaches     Social History:   Social History   Socioeconomic History  . Marital status: Married    Spouse name: Not on file  . Number of children: Not on file  . Years of education: Not on file  . Highest education level: Not on file  Occupational History  . Not on file  Social Needs  . Financial resource strain: Not on file  . Food insecurity:    Worry: Not on file    Inability: Not on file  . Transportation needs:    Medical: Not on file    Non-medical: Not on file  Tobacco Use  . Smoking status: Current Some Day Smoker    Types: Cigars  . Smokeless tobacco: Never Used  Substance and Sexual Activity  . Alcohol use: Yes    Alcohol/week: 7.0 standard drinks    Types: 7 Cans of beer per week    Comment: 1 beer/day  . Drug use: No  . Sexual activity: Not on file  Lifestyle  . Physical activity:    Days per week:  Not on file    Minutes per session: Not on file  . Stress: Not on file  Relationships  . Social  connections:    Talks on phone: Not on file    Gets together: Not on file    Attends religious service: Not on file    Active member of club or organization: Not on file    Attends meetings of clubs or organizations: Not on file    Relationship status: Not on file  . Intimate partner violence:    Fear of current or ex partner: Not on file    Emotionally abused: Not on file    Physically abused: Not on file    Forced sexual activity: Not on file  Other Topics Concern  . Not on file  Social History Narrative   Lives with wife, 1 dog. Grown children   Edu: college   Occ: owns mattress store   Activity: active at work, started [employer], wants to restart running   Diet: good water, fruits/vegetables daily    Family History:   Family History  Problem Relation Age of Onset  . CAD Mother        CABG  . Congenital heart disease Mother   . Hypertension Mother   . Hyperlipidemia Mother   . CAD Father        CABG  . Hypertension Father   . Hyperlipidemia Father   . Cancer Maternal Grandmother        colon  . Colon cancer Maternal Grandfather   . Diabetes Neg Hx   . Colon polyps Neg Hx   . Esophageal cancer Neg Hx   . Rectal cancer Neg Hx   . Stomach cancer Neg Hx      ROS:  Please see the history of present illness. All other ROS reviewed and negative.     Physical Exam/Data:   Vitals:   03/06/18 2144 03/06/18 2146 03/07/18 0510 03/07/18 0728  BP: 132/80 132/80 110/75 112/73  Pulse: 62 62 62 61  Resp: 17 17 16 20   Temp: 98.4 F (36.9 C) 98.4 F (36.9 C) (!) 97.3 F (36.3 C) 97.8 F (36.6 C)  TempSrc:  Oral Oral Oral  SpO2: 100%  98% 97%  Weight: 74.1 kg 74.1 kg 75 kg   Height: 5' 7"  (1.702 m) 5' 7"  (1.702 m)      Intake/Output Summary (Last 24 hours) at 03/07/2018 0911 Last data filed at 03/07/2018 0600 Gross per 24 hour  Intake 935.01 ml  Output 425 ml  Net  510.01 ml   Filed Weights   03/06/18 2144 03/06/18 2146 03/07/18 0510  Weight: 74.1 kg 74.1 kg 75 kg   Body mass index is 25.89 kg/m.  General:  Well nourished, well developed, in no acute distress. HEENT: normal Lymph: no adenopathy Neck: no JVD Endocrine:  No thryomegaly Vascular: No carotid bruits; 1+ right radial, 2+ left radial pulses. Cardiac:  normal S1, S2; RRR; no murmurs, rubs, or gallops. Lungs:  clear to auscultation bilaterally, no wheezing, rhonchi or rales  Abd: soft, nontender, no hepatomegaly  Ext: no edema Musculoskeletal:  No deformities, BUE and BLE strength normal and equal Skin: warm and dry  Neuro:  CNs 2-12 intact, no focal abnormalities noted Psych:  Normal affect   EKG:  The EKG performed 03/04/18 was personally reviewed and demonstrates:  NSR with evolving anterolateral MI. Telemetry:  Telemetry was personally reviewed and demonstrates:  NSR.  Relevant CV Studies: LHC/PCI (02/20/18):  Anterior ST elevation myocardial infarction due to proximal occlusion of the LAD.  Successful PCI and stent reducing 100% stenosis with 0 TIMI flow to 0% with  TIMI grade III flow using a 28 x 3.0 mm Synergy drug-eluting stent.  Postdilatation was not performed.  Deployment pressure 12 atm.  Mild distal left main disease, 20%  95% ostial proximal small to moderate size ramus intermedius.  Moderate to moderately severe circumflex disease with ostial 70 to 80% stenosis, dominant obtuse marginal with ostial 70% stenosis, and proximal 50 to 60% circumflex.  RCA is dominant with eccentric 70% stenosis.  Anterior and anteroapical akinesis with EF 35%.  LVEDP (24 mmHg)  is elevated and consistent with acute systolic heart failure.  Diagnostic  Dominance: Right    Intervention      Echocardiogram (02/22/18): - Left ventricle: The cavity size was normal. Wall thickness was   normal. Systolic function was moderately reduced. The estimated   ejection fraction was in the  range of 35% to 40%. Doppler   parameters are consistent with abnormal left ventricular   relaxation (grade 1 diastolic dysfunction). Spontaneous echo   contrast, sludge, and probable apical echo density in the left   ventricle with concern for LV apical thrombus. - Regional wall motion abnormality: Akinesis of the apical   anterior, mid anteroseptal, mid inferoseptal, apical inferior,   apical septal, and apical myocardium; severe hypokinesis of the   mid anterior myocardium; mild hypokinesis of the basal   anteroseptal myocardium. - Aortic valve: Transvalvular velocity was within the normal range.   There was no stenosis. There was no regurgitation. - Mitral valve: Transvalvular velocity was within the normal range.   There was no evidence for stenosis. There was no regurgitation. - Left atrium: The atrium was normal in size. - Right ventricle: The cavity size was normal. Wall thickness was   normal. Systolic function was normal. - Right atrium: The atrium was normal in size. Central venous   pressure (est): 3 mm Hg. - Tricuspid valve: Transvalvular velocity was within the normal   range. There was trivial regurgitation. - Inferior vena cava: The vessel was normal in size. The   respirophasic diameter changes were in the normal range (= 50%),   consistent with normal central venous pressure. - Pericardium, extracardiac: There was no pericardial effusion.  Laboratory Data:  Chemistry Recent Labs  Lab 03/04/18 1033 03/06/18 1439  NA 138 140  K 4.8 4.1  CL 94* 101  CO2 28 30  GLUCOSE 98 110*  BUN 35* 30*  CREATININE 1.59* 1.26*  CALCIUM 10.3* 8.8*  GFRNONAA 50* >60  GFRAA 58* >60  ANIONGAP  --  9    Recent Labs  Lab 03/06/18 1439  PROT 6.6  ALBUMIN 3.8  AST 26  ALT 58*  ALKPHOS 58  BILITOT 0.8   Hematology Recent Labs  Lab 03/04/18 1033 03/06/18 1439 03/06/18 2213 03/07/18 0450  WBC 16.0* 16.0*  --  10.5  RBC 5.26 4.88  --  4.59  HGB 14.8 14.3 14.0 13.4    HCT 45.8 45.3 43.2 41.1  MCV 87 92.8  --  89.5  MCH 28.1 29.3  --  29.2  MCHC 32.3 31.6  --  32.6  RDW 12.2* 13.1  --  13.0  PLT 399 365  --  324   Cardiac EnzymesNo results for input(s): TROPONINI in the last 168 hours. No results for input(s): TROPIPOC in the last 168 hours.  BNPNo results for input(s): BNP, PROBNP in the last 168 hours.  DDimer No results for input(s): DDIMER in the last 168 hours.  Radiology/Studies:  No results found.  Assessment and Plan:  Acute GI bleed Most likely related to underlying Crohn's disease, though other etiology certainly possible.  This is complicated by ongoing anticoagulation/antiplatelet therapy in the setting of recent anterior STEMI complicated by LV thrombus.  Obtain GI consultation.  Continue pantoprazole infusion.  Discontinue aspirin 81 mg daily.  Hold warfarin.  Start heparin infusion when INR < 2.0.  Continue clopidogrel 75 mg daily.  Long-term, I favor at least 1 month of warfarin and clopidogrel from the time of STEMI (ideally 6-12 months).  Recent STEMI and post-MI pericarditis No chest pain reported.  Continue clopidogrel.  Hold warfarin and aspirin, as outlined above.  Continue colchicine and prednisone (management of steroids per GI and IM; not used primarily for pericarditis).  Recommend 3-6 months of colchicine.  Continue carvedilol and atorvastatin.  Ischemic cardiomyopathy and acute systolic heart failure Patient appears euvolemic and well-compensated.  Continue carvedilol 3.125 mg BID and losartan 25 mg daily.  Maintain net even fluid balance.  Continue LifeVest, given LVEF felt to be <35% during recent hospitalization.  Hypertension  Continue current doses of carvedilol and losartan.  Hyperlipidemia  Continue atorvastatin 80 mg daily.  Outpatient lipid follow-up.  For questions or updates, please contact [place] Please consult [email or web address] for contact info under [facility]  Cardiology.  Signed, Nelva Bush, MD  03/07/2018 9:11 AM

## 2018-03-08 ENCOUNTER — Telehealth (HOSPITAL_COMMUNITY): Payer: Self-pay

## 2018-03-08 ENCOUNTER — Other Ambulatory Visit: Payer: BLUE CROSS/BLUE SHIELD

## 2018-03-08 DIAGNOSIS — I25118 Atherosclerotic heart disease of native coronary artery with other forms of angina pectoris: Secondary | ICD-10-CM

## 2018-03-08 LAB — MAGNESIUM: Magnesium: 2.1 mg/dL (ref 1.7–2.4)

## 2018-03-08 LAB — PHOSPHORUS: PHOSPHORUS: 2.9 mg/dL (ref 2.5–4.6)

## 2018-03-08 LAB — CBC
HCT: 40.1 % (ref 39.0–52.0)
Hemoglobin: 12.6 g/dL — ABNORMAL LOW (ref 13.0–17.0)
MCH: 29.3 pg (ref 26.0–34.0)
MCHC: 31.4 g/dL (ref 30.0–36.0)
MCV: 93.3 fL (ref 80.0–100.0)
Platelets: 307 10*3/uL (ref 150–400)
RBC: 4.3 MIL/uL (ref 4.22–5.81)
RDW: 13.2 % (ref 11.5–15.5)
WBC: 15.9 10*3/uL — ABNORMAL HIGH (ref 4.0–10.5)
nRBC: 0 % (ref 0.0–0.2)

## 2018-03-08 LAB — BASIC METABOLIC PANEL
Anion gap: 5 (ref 5–15)
BUN: 22 mg/dL — ABNORMAL HIGH (ref 6–20)
CO2: 28 mmol/L (ref 22–32)
Calcium: 7.9 mg/dL — ABNORMAL LOW (ref 8.9–10.3)
Chloride: 108 mmol/L (ref 98–111)
Creatinine, Ser: 1.36 mg/dL — ABNORMAL HIGH (ref 0.61–1.24)
GFR calc Af Amer: >60 mL/min (ref >60)
GFR calc non Af Amer: >60 mL/min (ref >60)
Glucose, Bld: 99 mg/dL (ref 70–99)
POTASSIUM: 4.5 mmol/L (ref 3.5–5.1)
Sodium: 141 mmol/L (ref 135–145)

## 2018-03-08 LAB — PROTIME-INR
INR: 2.91
Prothrombin Time: 30 seconds — ABNORMAL HIGH (ref 11.4–15.2)

## 2018-03-08 MED ORDER — HYDROCORTISONE ACE-PRAMOXINE 1-1 % RE FOAM: 1.0000 | Freq: Two times a day (BID) | RECTAL | 0 refills | Status: DC

## 2018-03-08 MED ORDER — PANTOPRAZOLE SODIUM 40 MG PO TBEC
40.0000 mg | DELAYED_RELEASE_TABLET | Freq: Two times a day (BID) | ORAL | Status: DC
  Administered 2018-03-08: 40 mg via ORAL
  Filled 2018-03-08: qty 1

## 2018-03-08 MED ORDER — WARFARIN SODIUM 5 MG PO TABS: 5.0000 mg | ORAL_TABLET | Freq: Every day | ORAL | 0 refills | Status: DC

## 2018-03-08 NOTE — Progress Notes (Signed)
Progress Note  Patient Name: Jack [name] Date of Encounter: 03/08/2018  Primary Cardiologist: Tamala Julian  Subjective   No chest pain or SOB. Has not had a BM in the past 24 hours. HGB 12.6 this morning. He had 2 short runs of NSVT over the past 24 hours, longest being 5 beats.   Inpatient Medications    Scheduled Meds: . atorvastatin  80 mg Oral q1800  . calcium carbonate  2 tablet Oral BID  . carvedilol  3.125 mg Oral BID WC  . clopidogrel  75 mg Oral Daily  . colchicine  0.6 mg Oral Daily  . gabapentin  300 mg Oral QHS  . hydrocortisone-pramoxine  1 applicator Rectal BID  . hydrOXYzine  100 mg Oral QHS  . loratadine  10 mg Oral Daily  . losartan  25 mg Oral Daily  . multivitamin with minerals  1 tablet Oral Daily  . [START ON 03/10/2018] pantoprazole  40 mg Intravenous Q12H  . predniSONE  18 mg Oral See admin instructions  . sodium chloride flush  3 mL Intravenous Q12H  . tamsulosin  0.4 mg Oral Daily   Continuous Infusions: . sodium chloride    . sodium chloride 50 mL/hr at 03/07/18 2057  . pantoprozole (PROTONIX) infusion 8 mg/hr (03/07/18 2055)   PRN Meds: sodium chloride, acetaminophen **OR** acetaminophen, diphenoxylate-atropine, furosemide, Melatonin, nitroGLYCERIN, ondansetron **OR** ondansetron (ZOFRAN) IV, sodium chloride flush   Vital Signs    Vitals:   03/07/18 1807 03/07/18 1937 03/08/18 0510 03/08/18 0744  BP: 133/87 128/89 114/71 128/73  Pulse: (!) 59 71 (!) 56 (!) 57  Resp:  20 18 20   Temp:  98.5 F (36.9 C) 97.8 F (36.6 C) 97.6 F (36.4 C)  TempSrc:  Oral Oral Oral  SpO2: 98% 99% 99% 100%  Weight:   76.3 kg   Height:        Intake/Output Summary (Last 24 hours) at 03/08/2018 0750 Last data filed at 03/08/2018 0510 Gross per 24 hour  Intake 1341.34 ml  Output 450 ml  Net 891.34 ml   Filed Weights   03/06/18 2146 03/07/18 0510 03/08/18 0510  Weight: 74.1 kg 75 kg 76.3 kg    Telemetry    NSR, 2 short runs of NSVT over the past  24 hours consistent of a 4 beat run and a 5 beat run - Personally Reviewed  ECG    n/a - Personally Reviewed  Physical Exam   GEN: No acute distress.   Neck: No JVD. Cardiac: RRR, no murmurs, rubs, or gallops.  Respiratory: Clear to auscultation bilaterally.  GI: Soft, nontender, non-distended.   MS: No edema; No deformity. Neuro:  Alert and oriented x 3; Nonfocal.  Psych: Normal affect.  Labs    Chemistry Recent Labs  Lab 03/04/18 1033 03/06/18 1439 03/08/18 0401  NA 138 140 141  K 4.8 4.1 4.5  CL 94* 101 108  CO2 28 30 28   GLUCOSE 98 110* 99  BUN 35* 30* 22*  CREATININE 1.59* 1.26* 1.36*  CALCIUM 10.3* 8.8* 7.9*  PROT  --  6.6  --   ALBUMIN  --  3.8  --   AST  --  26  --   ALT  --  58*  --   ALKPHOS  --  58  --   BILITOT  --  0.8  --   GFRNONAA 50* >60 >60  GFRAA 58* >60 >60  ANIONGAP  --  9 5  Hematology Recent Labs  Lab 03/06/18 1439  03/07/18 0450 03/07/18 1235 03/07/18 2030 03/08/18 0401  WBC 16.0*  --  10.5  --   --  15.9*  RBC 4.88  --  4.59  --   --  4.30  HGB 14.3   < > 13.4 12.9* 12.9* 12.6*  HCT 45.3   < > 41.1 40.1 41.3 40.1  MCV 92.8  --  89.5  --   --  93.3  MCH 29.3  --  29.2  --   --  29.3  MCHC 31.6  --  32.6  --   --  31.4  RDW 13.1  --  13.0  --   --  13.2  PLT 365  --  324  --   --  307   < > = values in this interval not displayed.    Cardiac EnzymesNo results for input(s): TROPONINI in the last 168 hours. No results for input(s): TROPIPOC in the last 168 hours.   BNPNo results for input(s): BNP, PROBNP in the last 168 hours.   DDimer No results for input(s): DDIMER in the last 168 hours.   Radiology    No results found.  Cardiac Studies   LHC/PCI (02/20/18):  Anterior ST elevation myocardial infarction due to proximal occlusion of the LAD.  Successful PCI and stent reducing 100% stenosis with 0 TIMI flow to 0% with TIMI grade III flow using a 28 x 3.0 mm Synergy drug-eluting stent. Postdilatation was not  performed. Deployment pressure 12 atm.  Mild distal left main disease, 20%  95% ostial proximal small to moderate size ramus intermedius.  Moderate to moderately severe circumflex disease with ostial 70 to 80% stenosis, dominant obtuse marginal with ostial 70% stenosis, and proximal 50 to 60% circumflex.  RCA is dominant with eccentric 70% stenosis.  Anterior and anteroapical akinesis with EF 35%. LVEDP (24 mmHg) is elevated and consistent with acute systolic heart failure.  Diagnostic  Dominance: Right    Intervention     ____________________  Echocardiogram (02/22/18): - Left ventricle: The cavity size was normal. Wall thickness was normal. Systolic function was moderately reduced. The estimated ejection fraction was in the range of 35% to 40%. Doppler parameters are consistent with abnormal left ventricular relaxation (grade 1 diastolic dysfunction). Spontaneous echo contrast, sludge, and probable apical echo density in the left ventricle with concern for LV apical thrombus. - Regional wall motion abnormality: Akinesis of the apical anterior, mid anteroseptal, mid inferoseptal, apical inferior, apical septal, and apical myocardium; severe hypokinesis of the mid anterior myocardium; mild hypokinesis of the basal anteroseptal myocardium. - Aortic valve: Transvalvular velocity was within the normal range. There was no stenosis. There was no regurgitation. - Mitral valve: Transvalvular velocity was within the normal range. There was no evidence for stenosis. There was no regurgitation. - Left atrium: The atrium was normal in size. - Right ventricle: The cavity size was normal. Wall thickness was normal. Systolic function was normal. - Right atrium: The atrium was normal in size. Central venous pressure (est): 3 mm Hg. - Tricuspid valve: Transvalvular velocity was within the normal range. There was trivial regurgitation. - Inferior vena  cava: The vessel was normal in size. The respirophasic diameter changes were in the normal range (= 50%), consistent with normal central venous pressure. - Pericardium, extracardiac: There was no pericardial effusion.  Patient Profile     52 y.o. male with history of systemic vasculitis, eosinophilic esophagitis, Crohn's disease, essential hypertension, sciatica and recent  complex admission for CAD/anterior STEMI with acute systolic CHF/ischemic cardiomyopathy, LV thrombus, post-MI pericarditis who is being seen today for the evaluation of rectal bleeding in the setting of anticoagulation and antiplatelet therapy.  Assessment & Plan    1. Acute GI bleed: -GI on board without recommendation for endoscopic evaluation at this time unless severe bleed develops  -Remains on Protonix gtt with plans to transition to PO Protonix long term -ASA and Coumadin have been held -Plan is to start heparin gtt when INR is < 2.0 as HGB allows -HGB trend from 14-->12.6 as of 12/9  2. CAD with recent STEMI and post-MI pericarditis: -No chest pain -Remains on Plavix 75 mg daily with interventional cardiology favoring at least 1 month of Coumadin and Plavix from the time of his STEMI, though ideally 6-12 months -ASA and Coumadin have been held as above -Remains on colchicine and prednisone for pericarditis, thought the prednisone is not primarily for his pericarditis  3. Acute HFrEF secondary to ICM with mural thrombus: -He appears euvolemic  -Continue Coreg and losartan -Consider starting spironolactone prior to discharge or in follow up as renal function allows -Continue to optimize HF medications as able -Coumadin on hold as above with plans to start heparin gtt once INR is < 2.0 -Continue LifeVest given EF < 35% during recent admission -He is scheduled for repeat echo in 05/2017 to reassess his LVEF following PCI and optimization of medical therapy   4. NSVT: -Asymptomatic -Magnesium 2.1,  potassium 4.5 -Coreg as above  5. AKI: -Gentle hydration  6. HTN: -Blood pressure is well controlled  7. HLD: -LDL 166 from 01/2018 -Remains on Lipitor 80 mg daily -Goal LDL < 70 -Outpatient follow up  For questions or updates, please contact [place] Please consult [email or web address] for contact info under Cardiology/STEMI.    Signed, Christell Faith, PA-C [facility] Pager: [phone] 03/08/2018, 7:50 AM

## 2018-03-08 NOTE — Telephone Encounter (Signed)
I returned call to pt's wife DPR. I am not sure though this return call was meant for me though as I s/w her on 03/04/18 in regards to lab results. Looks like this message was sent to me was in regards to Cardiac Rehab. I am not sure who this message needs to go to.

## 2018-03-08 NOTE — Discharge Summary (Signed)
[facility] [place] NAME: Jack [name]    MR#:  [id number]  DATE OF BIRTH:  1965/06/04  DATE OF ADMISSION:  03/06/2018 ADMITTING PHYSICIAN: Avel Peace Salary, MD  DATE OF DISCHARGE: 03/08/2018  PRIMARY CARE PHYSICIAN: [name], MD    ADMISSION DIAGNOSIS:  LV (left ventricular) mural thrombus following MI ([place]) [I23.6] Warfarin anticoagulation [Z79.01] Crohn's disease of colon with rectal bleeding ([place]) [K50.111] GIB (gastrointestinal bleeding) [K92.2]  DISCHARGE DIAGNOSIS:  Active Problems:   GIB (gastrointestinal bleeding)   SECONDARY DIAGNOSIS:   Past Medical History:  Diagnosis Date  . BPH (benign prostatic hypertrophy)   . CAD (coronary artery disease)    a. anterior STEMI 01/2018 -  proximal occlusion of LAD, treated with DES. Cath also showed 20% distal LM, 95% ostial-prox small-moderate ramus, 70-80% ostial Cx, 70% dominant ostial OM, 50-60% prox Cx, 70% RCA. EF 35% by cath with LVEDP 62mHg. Med rx for residual disease. Course complicated by post MI pericarditis and LV thrombus.  . Chronic systolic (congestive) heart failure ([place]   . Colitis   . Colon polyp    inflammatory  . Crohn disease ([place] 1992   history uveitis, involvement of intestines and lungs  . Diverticulosis   . Eosinophilic esophagitis   . Essential hypertension   . GERD (gastroesophageal reflux disease)   . History of chicken pox   . History of gastroesophageal reflux (GERD)   . History of seizure 1995   grand mal x1, completed 6 yrs dilantin. no seizures since  . Hyperlipidemia   . Internal hemorrhoids   . Ischemic cardiomyopathy   . LV (left ventricular) mural thrombus following [place] ([place] 01/2018  . Osteoporosis 11/2015   DEXA T -2.9  . Schwannoma 2007   L axilla s/p surgery  . Seizures ([place]    last seizure 1995 and only x 1 seizure-   . Ulnar neuropathy    h/o this from L arm schwannoma    HOSPITAL COURSE:    52year old male  with history of systemic vasculitis, eosinophilic esophagitis, Crohn's disease on chronic prednisone, CAD with recent anterior STEMI and ischemic cardiomyopathy with LV thrombus on Coumadin and post MI pericarditis who presented with rectal bleeding to the emergency room.  1.  Acute GIB with Rectal bleeding: Patient is been evaluated by GI.  No recommendation for endoscopic evaluation due to recent STEMI and LV thrombus. Hemoglobin has remained relatively stable.   He will be discharged on PPI twice daily as recommended by GI  He will need outpatient GI follow-up.    2.  Recent STEMI and post MI pericarditis: Patient will continue on Plavix as per recommendations by cardiology due to recent stent. Continue Coreg, losartan and Lipitor.   3.  Post MI pericarditis: Patient is on colchicine   4.  Ischemic cardiomyopathy with chronic systolic heart failure EF 35%: Patient is euvolemic. Continue Coreg and losartan Patient was discharged with LifeVest  5.  Crohn's disease: Patient is on prednisone He will need to follow up with his PCP  6.  BPH: Continue Flomax  7.  LV thrombus: Coumadin can be restarted as per GI with close monitoring of INR   DISCHARGE CONDITIONS AND DIET:   Stable Cardiac diet  CONSULTS OBTAINED:  Treatment Team:  ENelva Bush MD VLin Landsman MD  DRUG ALLERGIES:   Allergies  Allergen Reactions  . Cephalexin Nausea And Vomiting    Other Reaction: GI UPSET  . Duloxetine Other (See  Comments)    Headaches     DISCHARGE MEDICATIONS:   Allergies as of 03/08/2018      Reactions   Cephalexin Nausea And Vomiting   Other Reaction: GI UPSET   Duloxetine Other (See Comments)   Headaches      Medication List    STOP taking these medications   aspirin 81 MG EC tablet     TAKE these medications   acetaminophen 325 MG tablet Commonly known as:  TYLENOL Take 650 mg by mouth every 6 (six) hours as needed for mild pain or headache.    alendronate 70 MG tablet Commonly known as:  FOSAMAX Take 70 mg by mouth every Saturday.   atorvastatin 80 MG tablet Commonly known as:  LIPITOR Take 1 tablet (80 mg total) by mouth daily at 6 PM.   CALCIUM 1200 PO Take 1 capsule by mouth 2 (two) times daily.   carvedilol 3.125 MG tablet Commonly known as:  COREG Take 1 tablet (3.125 mg total) by mouth 2 (two) times daily with a meal.   cetirizine 10 MG tablet Commonly known as:  ZYRTEC Take 10 mg by mouth daily.   clopidogrel 75 MG tablet Commonly known as:  PLAVIX Take 1 tablet (75 mg total) by mouth daily.   colchicine 0.6 MG tablet Take 1 tablet (0.6 mg total) by mouth daily.   diphenoxylate-atropine 2.5-0.025 MG tablet Commonly known as:  LOMOTIL Take 1-2 tablets by mouth 4 times daily as needed for diarrhea   ENTYVIO 300 MG injection Generic drug:  vedolizumab Inject 300 mg into the vein every 8 (eight) weeks.   furosemide 40 MG tablet Commonly known as:  LASIX Take 1 tablet (40 mg total) by mouth daily as needed for fluid or edema.   gabapentin 300 MG capsule Commonly known as:  NEURONTIN Take one tab QHS for 4 days, then one tab BID for 4 days, then one tab TID What changed:    how much to take  additional instructions   hydrocortisone-pramoxine rectal foam Commonly known as:  PROCTOFOAM-HC Place 1 applicator rectally 2 (two) times daily.   hydrOXYzine 25 MG tablet Commonly known as:  ATARAX/VISTARIL TAKE 4 TABLETS (100 MG TOTAL) BY MOUTH AT BEDTIME.   losartan 25 MG tablet Commonly known as:  COZAAR Take 1 tablet (25 mg total) by mouth daily.   multivitamin capsule Take 1 capsule by mouth daily.   nitroGLYCERIN 0.4 MG SL tablet Commonly known as:  NITROSTAT Place 1 tablet (0.4 mg total) under the tongue every 5 (five) minutes as needed for chest pain.   pantoprazole 40 MG tablet Commonly known as:  PROTONIX Take 1 tablet (40 mg total) by mouth daily.   predniSONE 10 MG tablet Commonly  known as:  DELTASONE Take 2 tablets (20 mg total) by mouth daily with breakfast. What changed:    how much to take  when to take this  additional instructions  Another medication with the same name was removed. Continue taking this medication, and follow the directions you see here.   tamsulosin 0.4 MG Caps capsule Commonly known as:  FLOMAX TAKE 1 CAPSULE (0.4 MG TOTAL) BY MOUTH DAILY.   warfarin 5 MG tablet Commonly known as:  COUMADIN Take as directed. If you are unsure how to take this medication, talk to your nurse or doctor. Original instructions:  Take 1 tablet (5 mg total) by mouth daily. Please do not take this on 12/9 INR is 2.91 plesae take 2.5 mg on 12/10  Have INR checked then PCP can adjust thanks What changed:    how much to take  how to take this  when to take this  additional instructions         Today   CHIEF COMPLAINT:   Patient would like to go home.  He had a bowel movement yesterday with some blood   VITAL SIGNS:  Blood pressure 128/73, pulse 72, temperature 97.6 F (36.4 C), temperature source Oral, resp. rate 20, height 5' 7"  (1.702 m), weight 76.3 kg, SpO2 100 %.   REVIEW OF SYSTEMS:  Review of Systems  Constitutional: Negative.  Negative for chills, fever and malaise/fatigue.  HENT: Negative.  Negative for ear discharge, ear pain, hearing loss, nosebleeds and sore throat.   Eyes: Negative.  Negative for blurred vision and pain.  Respiratory: Negative.  Negative for cough, hemoptysis, shortness of breath and wheezing.   Cardiovascular: Negative.  Negative for chest pain, palpitations and leg swelling.  Gastrointestinal: Negative.  Negative for abdominal pain, blood in stool, diarrhea, nausea and vomiting.  Genitourinary: Negative.  Negative for dysuria.  Musculoskeletal: Negative.  Negative for back pain.  Skin: Negative.   Neurological: Negative for dizziness, tremors, speech change, focal weakness, seizures and headaches.   Endo/Heme/Allergies: Negative.  Does not bruise/bleed easily.  Psychiatric/Behavioral: Negative.  Negative for depression, hallucinations and suicidal ideas.     PHYSICAL EXAMINATION:  GENERAL:  52 y.o.-year-old patient lying in the bed with no acute distress.  NECK:  Supple, no jugular venous distention. No thyroid enlargement, no tenderness.  LUNGS: Normal breath sounds bilaterally, no wheezing, rales,rhonchi  No use of accessory muscles of respiration.  CARDIOVASCULAR: S1, S2 normal. No murmurs, rubs, or gallops.  ABDOMEN: Soft, non-tender, non-distended. Bowel sounds present. No organomegaly or mass.  EXTREMITIES: No pedal edema, cyanosis, or clubbing.  PSYCHIATRIC: The patient is alert and oriented x 3.  SKIN: No obvious rash, lesion, or ulcer.   DATA REVIEW:   CBC Recent Labs  Lab 03/08/18 0401  WBC 15.9*  HGB 12.6*  HCT 40.1  PLT 307    Chemistries  Recent Labs  Lab 03/06/18 1439 03/08/18 0401  NA 140 141  K 4.1 4.5  CL 101 108  CO2 30 28  GLUCOSE 110* 99  BUN 30* 22*  CREATININE 1.26* 1.36*  CALCIUM 8.8* 7.9*  MG  --  2.1  AST 26  --   ALT 58*  --   ALKPHOS 58  --   BILITOT 0.8  --     Cardiac Enzymes No results for input(s): TROPONINI in the last 168 hours.  Microbiology Results  @MICRORSLT48 @  RADIOLOGY:  No results found.    Allergies as of 03/08/2018      Reactions   Cephalexin Nausea And Vomiting   Other Reaction: GI UPSET   Duloxetine Other (See Comments)   Headaches      Medication List    STOP taking these medications   aspirin 81 MG EC tablet     TAKE these medications   acetaminophen 325 MG tablet Commonly known as:  TYLENOL Take 650 mg by mouth every 6 (six) hours as needed for mild pain or headache.   alendronate 70 MG tablet Commonly known as:  FOSAMAX Take 70 mg by mouth every Saturday.   atorvastatin 80 MG tablet Commonly known as:  LIPITOR Take 1 tablet (80 mg total) by mouth daily at 6 PM.   CALCIUM 1200  PO Take 1 capsule by mouth 2 (two) times  daily.   carvedilol 3.125 MG tablet Commonly known as:  COREG Take 1 tablet (3.125 mg total) by mouth 2 (two) times daily with a meal.   cetirizine 10 MG tablet Commonly known as:  ZYRTEC Take 10 mg by mouth daily.   clopidogrel 75 MG tablet Commonly known as:  PLAVIX Take 1 tablet (75 mg total) by mouth daily.   colchicine 0.6 MG tablet Take 1 tablet (0.6 mg total) by mouth daily.   diphenoxylate-atropine 2.5-0.025 MG tablet Commonly known as:  LOMOTIL Take 1-2 tablets by mouth 4 times daily as needed for diarrhea   ENTYVIO 300 MG injection Generic drug:  vedolizumab Inject 300 mg into the vein every 8 (eight) weeks.   furosemide 40 MG tablet Commonly known as:  LASIX Take 1 tablet (40 mg total) by mouth daily as needed for fluid or edema.   gabapentin 300 MG capsule Commonly known as:  NEURONTIN Take one tab QHS for 4 days, then one tab BID for 4 days, then one tab TID What changed:    how much to take  additional instructions   hydrocortisone-pramoxine rectal foam Commonly known as:  PROCTOFOAM-HC Place 1 applicator rectally 2 (two) times daily.   hydrOXYzine 25 MG tablet Commonly known as:  ATARAX/VISTARIL TAKE 4 TABLETS (100 MG TOTAL) BY MOUTH AT BEDTIME.   losartan 25 MG tablet Commonly known as:  COZAAR Take 1 tablet (25 mg total) by mouth daily.   multivitamin capsule Take 1 capsule by mouth daily.   nitroGLYCERIN 0.4 MG SL tablet Commonly known as:  NITROSTAT Place 1 tablet (0.4 mg total) under the tongue every 5 (five) minutes as needed for chest pain.   pantoprazole 40 MG tablet Commonly known as:  PROTONIX Take 1 tablet (40 mg total) by mouth daily.   predniSONE 10 MG tablet Commonly known as:  DELTASONE Take 2 tablets (20 mg total) by mouth daily with breakfast. What changed:    how much to take  when to take this  additional instructions  Another medication with the same name was removed.  Continue taking this medication, and follow the directions you see here.   tamsulosin 0.4 MG Caps capsule Commonly known as:  FLOMAX TAKE 1 CAPSULE (0.4 MG TOTAL) BY MOUTH DAILY.   warfarin 5 MG tablet Commonly known as:  COUMADIN Take as directed. If you are unsure how to take this medication, talk to your nurse or doctor. Original instructions:  Take 1 tablet (5 mg total) by mouth daily. Please do not take this on 12/9 INR is 2.91 plesae take 2.5 mg on 12/10 Have INR checked then PCP can adjust thanks What changed:    how much to take  how to take this  when to take this  additional instructions         Management plans discussed with the patient and he is in agreement. Stable for discharge home  Patient should follow up with cardiology  CODE STATUS:     Code Status Orders  (From admission, onward)         Start     Ordered   03/06/18 2146  Full code  Continuous     03/06/18 2145        Code Status History    Date Active Date Inactive Code Status Order ID Comments User Context   02/20/2018 2311 02/24/2018 2014 Full Code [id number]  Belva Crome, MD Inpatient   02/20/2018 2311 02/20/2018 2311 Full Code [id number]  Daneen Schick  Viona Gilmore, MD Inpatient   09/10/2016 1630 09/14/2016 1533 Full Code [id number]  Bonnielee Haff, MD Inpatient      TOTAL TIME TAKING CARE OF THIS PATIENT: 38 minutes.    Note: This dictation was prepared with Dragon dictation along with smaller phrase technology. Any transcriptional errors that result from this process are unintentional.  Jaxtyn Linville M.D on 03/08/2018 at 10:03 AM  Between 7am to 6pm - Pager - [phone] After 6pm go to [email or web address] - password [email or web address] [facility]  Office  [phone]  CC: Primary care physician; [name], MD

## 2018-03-08 NOTE — Progress Notes (Signed)
CCMD reports 4 beat run v tach. Pt asymptomatic, no c/o pain. MD made aware, Magnesium, phosphate, and calcium added to a.m. labs.

## 2018-03-08 NOTE — Progress Notes (Signed)
Pt discharged home today via SWOT RN, IV removed, tele monitor removed, VSS, no complaints pt given follow up information, and prescription information along with medication regimen for discharge.

## 2018-03-08 NOTE — Telephone Encounter (Signed)
Pt returned [facility] phone call, pt stated he is not sure about joining [facility] right now. If anything changes he will give [place] a call.  Closed referral

## 2018-03-08 NOTE — Plan of Care (Signed)

## 2018-03-08 NOTE — Telephone Encounter (Signed)
Attempted to call patient in regards to Cardiac Rehab - LM on VM 

## 2018-03-09 ENCOUNTER — Telehealth: Payer: Self-pay | Admitting: Physician Assistant

## 2018-03-09 DIAGNOSIS — Z79899 Other long term (current) drug therapy: Secondary | ICD-10-CM

## 2018-03-09 LAB — CALCIUM, IONIZED: Calcium, Ionized, Serum: 4.8 mg/dL (ref 4.5–5.6)

## 2018-03-09 NOTE — Telephone Encounter (Signed)
Called spoke with pt and pt's wife, made appt for Coumadin check for Wednesday 03/10/18 at 1:30pm.  Advised of med changes per Dr Tamala Julian, will resume Losartan 58m QD tomorrow am.  Aware to start Spironolactone 12.542mQD, will need rx [facility] [place] [address], [name] Aware to discontinue Lasix for now.  Will check BP daily 2 hours after taking BP meds and keep log.  Pt will need lab appt scheduled to check BMET in 5 days.   Also advised per Dr SmTamala Juliant [facility] and severe cardiomyopathy. Pt's wife requested these medication instructions and travel recommendations [facility] can read over and understand fiully given the overload of information she has been absorbing recently.  Could you please do this or provide a printout for [facility] tomorrow.  Thanks

## 2018-03-09 NOTE — Telephone Encounter (Signed)
Just an FYI, I contacted [facility] team to let them know we are handling this, did not need to go to [facility] triage as well. Appreciate your help, Anderson Malta!

## 2018-03-09 NOTE — Telephone Encounter (Signed)
Received message below from [facility], PA-C. Contacted pt and left a message for pt to call back.

## 2018-03-09 NOTE — Telephone Encounter (Signed)
Anderson Malta -  Please call patient. He was the one who had sent [place] the message that he could not make his lab appointment because he was readmitted. Our [place] team cared for him when he was in the hospital at [facility] the last few days. Christell Faith notified me that Internal Medicine discharged the patient before Dr. Fletcher Anon could see him yesterday and therefore a concrete discharge plan was not established, so we need to help bridge the gap back to outpatient.  Please let him know I discussed his case and updates with Dr. Pernell Dupre in depth this afternoon. Dr. Tamala Julian agrees with staying off the Lasix for now, but recommends he go ahead and restart losartan at 14m daily. He also recommends the patient start spironolactone 12.535mdaily. This is a different kind of diuretic that will not have as potent as effect of fluid removal as Lasix but will provide some benefit to remodel his heart and ward fluid retention off. He will need repeat BMET in 5 days to make sure kidney function is stable/improving. When he comes in for that BMET please have him drop off a BP log of his daily blood pressures taken at least 2 hours after AM meds. I would recommend 30 day rx over 90 day rx as this is a work in progress where doses may be changed if able to tolerate in the future.  At recent office visit, the patient was also extremely concerned about the issues of returning to driving, [facility] [place] that given his severe cardiomyopathy and complex admission, he should not drive for now. He is OK with the patient getting a limited echocardiogram after Christmas (>1 month out from MI/stent which was 02/20/18) to reassess EF. IF EF is >35% at that time we will release him to drive. Dr. SmTamala Julianeviewed his repeat labs with the kidney change and rectal bleeding, he does NOT think it is a good idea for the [place] to [place] [facility] where he  would be unfamiliar to the medical community should an emergency arise. [place]. We are happy to write a note if needed for work or travel insurance.   The patient had an INR check scheduled on 03/08/18 but was in the hospital at that time. Please coordinate with the Coumadin clinic about when he should have that rescheduled for.  [facility] also [facility] make [facility] aware our office is taking care of wrapping up the loose ends as above.    PA-C

## 2018-03-09 NOTE — Telephone Encounter (Signed)
Please see detailed note from Melina Copa, PA-C regarding her conversation with Dr. Tamala Julian and medication changes.  Please ensure the patient has been notified of these changes at their recommendation.  This note has also been routed to the Coumadin clinic for their input on when the patient should follow-up for repeat INR check.  Please ensure that this is followed up with.

## 2018-03-10 ENCOUNTER — Ambulatory Visit (INDEPENDENT_AMBULATORY_CARE_PROVIDER_SITE_OTHER): Payer: BLUE CROSS/BLUE SHIELD | Admitting: Pharmacist

## 2018-03-10 ENCOUNTER — Other Ambulatory Visit: Payer: BLUE CROSS/BLUE SHIELD | Admitting: *Deleted

## 2018-03-10 ENCOUNTER — Other Ambulatory Visit: Payer: Self-pay

## 2018-03-10 DIAGNOSIS — I236 Thrombosis of atrium, auricular appendage, and ventricle as current complications following acute myocardial infarction: Secondary | ICD-10-CM | POA: Diagnosis not present

## 2018-03-10 DIAGNOSIS — Z5181 Encounter for therapeutic drug level monitoring: Secondary | ICD-10-CM | POA: Diagnosis not present

## 2018-03-10 DIAGNOSIS — Z7901 Long term (current) use of anticoagulants: Secondary | ICD-10-CM

## 2018-03-10 LAB — POCT INR: INR: 1.4 — AB (ref 2.0–3.0)

## 2018-03-10 MED ORDER — SPIRONOLACTONE 25 MG PO TABS: 12.5000 mg | ORAL_TABLET | Freq: Every day | ORAL | 3 refills | Status: DC

## 2018-03-10 NOTE — Addendum Note (Signed)
Addended by: Gaetano Net on: 03/10/2018 07:28 AM   Modules accepted: Orders

## 2018-03-10 NOTE — Telephone Encounter (Signed)
Printed out a copy of medication instructions below for pt to pick up at coumadin clinic appt tomorrow.

## 2018-03-10 NOTE — Patient Instructions (Signed)
Change dose to 38m daily except 2.555mTues, Thur, Sat. Recheck INR in 1 week. Coumadin Clinic #[phone] #[phone]

## 2018-03-11 ENCOUNTER — Encounter: Payer: Self-pay | Admitting: Family Medicine

## 2018-03-11 LAB — CBC
Hematocrit: 42.9 % (ref 37.5–51.0)
Hemoglobin: 14.2 g/dL (ref 13.0–17.7)
MCH: 29.2 pg (ref 26.6–33.0)
MCHC: 33.1 g/dL (ref 31.5–35.7)
MCV: 88 fL (ref 79–97)
Platelets: 384 10*3/uL (ref 150–450)
RBC: 4.86 x10E6/uL (ref 4.14–5.80)
RDW: 12.2 % — ABNORMAL LOW (ref 12.3–15.4)
WBC: 14.5 10*3/uL — ABNORMAL HIGH (ref 3.4–10.8)

## 2018-03-11 NOTE — Progress Notes (Addendum)
BP 116/60 (BP Location: Left Arm, Patient Position: Sitting, Cuff Size: Normal)   Pulse 65   Temp 97.8 F (36.6 C) (Oral)   Ht 5' 6.5" (1.689 m)   Wt 172 lb (78 kg)   SpO2 96%   BMI 27.35 kg/m    CC: hosp f/u visit Subjective:    Patient ID: Jack [name], male    DOB: Aug 18, 1965, 52 y.o.   MRN: [id number]  HPI: Jack [name] is a 52 y.o. male presenting on 03/12/2018 for Hospitalization Follow-up (Pt accompanied by his wife, Mickel Baas. )   Known h/o systemic vasculitis, eosinophilic esophagitis and crohn's disease.   Initial hospitalization 11/23-27/2019 for acute STEMI involving LAD s/p PCI with DES to ostial LAD to proximal LAD. He also had LV mural thrombus treated with coumadin and pericarditis treated with colchicine (plan 3 mo therapy). He was also discharged on DAPT (aspirin, plavix). Found to have ischemic cardiomyopathy as well. Second hospitalization 12/7-11/2017 for GI (rectal) bleed. Discharged on BID PPI, rec outpt GI f/u. Aspirin was stopped, coumadin was held then restarted day after discharge. All records reviewed. He was discharged with LifeVest.   INR checked Wednesday - 1.4. planned f/u at coumadin clinic next week. Planning to see [facility] rheum 03/29/2018 Planning to see GI (Pyrtle) 03/2018.  Has f/u cardiology 03/2018.   Current prednisone dose is 83m daily.  Has had normal PAD evaluation.  Ongoing R calf pain presumed sciatica treating with gabapentin 3077min am and 6008mn pm. This is prescribed by Dr DraMicheline Chapmanas spinal cord stimulator (can't receive MRIs). Has had venous ultrasounds to r/o DVT.  On spironolactone (started yesterday) in place of lasix.   Since home, feels well, just fatigued. Denies fevers/chills, chest pain, dyspnea.   TCM hospital f/u phone call not completed.   Relevant past medical, surgical, family and social history reviewed and updated as indicated. Interim medical history since our last visit reviewed. Allergies and medications  reviewed and updated. Discharge medications reconciled with current home medications.  Outpatient Medications Prior to Visit  Medication Sig Dispense Refill  . acetaminophen (TYLENOL) 325 MG tablet Take 650 mg by mouth every 6 (six) hours as needed for mild pain or headache.     . alendronate (FOSAMAX) 70 MG tablet Take 70 mg by mouth every Saturday.   11  . atorvastatin (LIPITOR) 80 MG tablet Take 1 tablet (80 mg total) by mouth daily at 6 PM. 30 tablet 3  . Calcium Carbonate-Vit D-Min (CALCIUM 1200 PO) Take 1 capsule by mouth 2 (two) times daily.    . carvedilol (COREG) 3.125 MG tablet Take 1 tablet (3.125 mg total) by mouth 2 (two) times daily with a meal. 60 tablet 3  . cetirizine (ZYRTEC) 10 MG tablet Take 10 mg by mouth daily.     . clopidogrel (PLAVIX) 75 MG tablet Take 1 tablet (75 mg total) by mouth daily. 30 tablet 12  . colchicine 0.6 MG tablet Take 1 tablet (0.6 mg total) by mouth daily. 90 tablet 3  . diphenoxylate-atropine (LOMOTIL) 2.5-0.025 MG tablet Take 1-2 tablets by mouth 4 times daily as needed for diarrhea    . gabapentin (NEURONTIN) 300 MG capsule Take one tab QHS for 4 days, then one tab BID for 4 days, then one tab TID (Patient taking differently: 300 mg 3 (three) times daily. ) 90 capsule 1  . hydrocortisone-pramoxine (PROCTOFOAM-HC) rectal foam Place 1 applicator rectally 2 (two) times daily. 10 g 0  . hydrOXYzine (ATARAX/VISTARIL)  25 MG tablet TAKE 4 TABLETS (100 MG TOTAL) BY MOUTH AT BEDTIME. 360 tablet 2  . losartan (COZAAR) 25 MG tablet Take 1 tablet (25 mg total) by mouth daily. 60 tablet 3  . Multiple Vitamin (MULTIVITAMIN) capsule Take 1 capsule by mouth daily.    . nitroGLYCERIN (NITROSTAT) 0.4 MG SL tablet Place 1 tablet (0.4 mg total) under the tongue every 5 (five) minutes as needed for chest pain. 25 tablet 12  . predniSONE (DELTASONE) 10 MG tablet Take 1 tablet (10 mg total) by mouth See admin instructions. Take 26m with (8) 117mtablets to equal 18 mg by  mouth once daily    . predniSONE (DELTASONE) 5 MG tablet Take 1 tablet by mouth daily. Takes with 10 mg and 3- 1 mg tabs (18 mg total)  2  . spironolactone (ALDACTONE) 25 MG tablet Take 0.5 tablets (12.5 mg total) by mouth daily. 45 tablet 3  . tamsulosin (FLOMAX) 0.4 MG CAPS capsule TAKE 1 CAPSULE (0.4 MG TOTAL) BY MOUTH DAILY. 30 capsule 11  . vedolizumab (ENTYVIO) 300 MG injection Inject 300 mg into the vein every 8 (eight) weeks.     . Marland Kitchenarfarin (COUMADIN) 5 MG tablet Take 1 tablet (5 mg total) by mouth daily. Please do not take this on 12/9 INR is 2.91 plesae take 2.5 mg on 12/10 Have INR checked then PCP can adjust thanks 30 tablet 0  . pantoprazole (PROTONIX) 40 MG tablet Take 1 tablet (40 mg total) by mouth daily. 30 tablet 2  . predniSONE (DELTASONE) 10 MG tablet Take 2 tablets (20 mg total) by mouth daily with breakfast. (Patient taking differently: Take 18 mg by mouth See admin instructions. Take 1076mith (8) 1mg48mblets to equal 18 mg by mouth once daily) 60 tablet 3  . predniSONE (DELTASONE) 1 MG tablet Take 3 tablets (3 mg total) by mouth daily with breakfast. Total 18mg105mly     No facility-administered medications prior to visit.      Per HPI unless specifically indicated in ROS section below Review of Systems     Objective:    BP 116/60 (BP Location: Left Arm, Patient Position: Sitting, Cuff Size: Normal)   Pulse 65   Temp 97.8 F (36.6 C) (Oral)   Ht 5' 6.5" (1.689 m)   Wt 172 lb (78 kg)   SpO2 96%   BMI 27.35 kg/m   Wt Readings from Last 3 Encounters:  03/12/18 172 lb (78 kg)  03/08/18 168 lb 4.8 oz (76.3 kg)  03/04/18 170 lb 1.9 oz (77.2 kg)    Physical Exam Vitals signs and nursing note reviewed.  Constitutional:      General: He is not in acute distress.    Appearance: Normal appearance.  HENT:     Head: Normocephalic and atraumatic.     Mouth/Throat:     Mouth: Mucous membranes are moist.     Pharynx: No oropharyngeal exudate or posterior  oropharyngeal erythema.  Eyes:     Extraocular Movements: Extraocular movements intact.     Pupils: Pupils are equal, round, and reactive to light.  Cardiovascular:     Rate and Rhythm: Normal rate and regular rhythm.     Pulses: Normal pulses.     Heart sounds: Normal heart sounds. No murmur.  Pulmonary:     Effort: Pulmonary effort is normal. No respiratory distress.     Breath sounds: Normal breath sounds. No wheezing, rhonchi or rales.  Musculoskeletal: Normal range of motion.  General: No swelling.  Skin:    General: Skin is warm and dry.     Findings: No rash.  Neurological:     Mental Status: He is alert.  Psychiatric:        Mood and Affect: Mood normal.    Results for orders placed or performed in visit on 03/10/18  CBC  Result Value Ref Range   WBC 14.5 (H) 3.4 - 10.8 x10E3/uL   RBC 4.86 4.14 - 5.80 x10E6/uL   Hemoglobin 14.2 13.0 - 17.7 g/dL   Hematocrit 42.9 37.5 - 51.0 %   MCV 88 79 - 97 fL   MCH 29.2 26.6 - 33.0 pg   MCHC 33.1 31.5 - 35.7 g/dL   RDW 12.2 (L) 12.3 - 15.4 %   Platelets 384 150 - 450 x10E3/uL   Lab Results  Component Value Date   CHOL 224 (H) 02/20/2018   HDL 50 02/20/2018   LDLCALC 166 (H) 02/20/2018   TRIG 40 02/20/2018   CHOLHDL 4.5 02/20/2018   Lab Results  Component Value Date   CREATININE 1.36 (H) 03/08/2018   BUN 22 (H) 03/08/2018   NA 141 03/08/2018   K 4.5 03/08/2018   CL 108 03/08/2018   CO2 28 03/08/2018       Assessment & Plan:   Problem List Items Addressed This Visit    Vasculitis ([place])   Status post insertion of drug-eluting stent into left anterior descending (LAD) artery for coronary artery disease   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       Osteoporosis    Takes calcium/vit D. Also on fosamax weekly.       LV (left ventricular) mural thrombus following MI (HCC)    Continue coumadin - followed by cardiology coumadin clinic.       Hyperlipidemia    Now on lipitor 868mdaily.         Hypereosinophilic syndrome   Relevant Medications   predniSONE (DELTASONE) 5 MG tablet   predniSONE (DELTASONE) 1 MG tablet   predniSONE (DELTASONE) 10 MG tablet   GIB (gastrointestinal bleeding)    Seen by GI during latest hospitalization after rectal bleed, aspirin stopped. Coumadin and plavix were continued. rec protonix BID indefinitely - he has been taking once daily. Advised increase to BID and f/u with Dr PyHilarie Fredricksonext month.      Essential hypertension    Current regimen is spironolactone 12.46m52maily, losartan 246m30mily and carvedilol 3.1246mg37m. Spironolactone is newest addition - I have asked him to return next week for BMP. Will also check prostate.       Relevant Orders   Renal function panel   Eosinophilic esophagitis   Elevated PSA measurement    I don't see where we ever received results of last PSA which seems to have been drawn 09/2017. Will need this rechecked next at week labs. If persistently elevated will need uro eval. Known h/o BPH on flomax but PSA has been elevated.       Relevant Orders   PSA, Total with Reflex to PSA, Free   Crohn disease (HCC) JollyFollowed by Dr PyrtlHilarie Fredricksonentyvio and prednisone 18mg 99my - undergoing very slow extended taper.       Acute ST elevation myocardial infarction (STEMI) involving left anterior descending (LAD) coronary artery (HCC) - Primary    Recently sustained LAD STEMI s/p DES, closely followed by cardiology (Dr Smith)Tamala Julian on plavix (aspirin was stopped after  GIB).           Meds ordered this encounter  Medications  . pantoprazole (PROTONIX) 40 MG tablet    Sig: Take 1 tablet (40 mg total) by mouth 2 (two) times daily.    Dispense:  60 tablet    Refill:  6   Orders Placed This Encounter  Procedures  . Renal function panel    Standing Status:   Future    Standing Expiration Date:   03/14/2019  . PSA, Total with Reflex to PSA, Free    Standing Status:   Future    Standing Expiration Date:   03/14/2019     Follow up plan: No follow-ups on file.  Patient Instructions  Good to see you today. Keep all follow up appointments. I've refilled protonix (pantoprazole 69m) twice daily until you see Dr PHilarie Fredrickson  Return next week for labs if not done at cardiology (kidney and potassium check).    JRia Bush MD

## 2018-03-12 ENCOUNTER — Ambulatory Visit: Payer: BLUE CROSS/BLUE SHIELD | Admitting: Family Medicine

## 2018-03-12 ENCOUNTER — Encounter: Payer: Self-pay | Admitting: Family Medicine

## 2018-03-12 VITALS — BP 116/60 | HR 65 | Temp 97.8°F | Ht 66.5 in | Wt 172.0 lb

## 2018-03-12 DIAGNOSIS — I10 Essential (primary) hypertension: Secondary | ICD-10-CM

## 2018-03-12 DIAGNOSIS — K922 Gastrointestinal hemorrhage, unspecified: Secondary | ICD-10-CM

## 2018-03-12 DIAGNOSIS — I236 Thrombosis of atrium, auricular appendage, and ventricle as current complications following acute myocardial infarction: Secondary | ICD-10-CM

## 2018-03-12 DIAGNOSIS — D72119 Hypereosinophilic syndrome (hes), unspecified: Secondary | ICD-10-CM

## 2018-03-12 DIAGNOSIS — I776 Arteritis, unspecified: Secondary | ICD-10-CM

## 2018-03-12 DIAGNOSIS — K200 Eosinophilic esophagitis: Secondary | ICD-10-CM

## 2018-03-12 DIAGNOSIS — R972 Elevated prostate specific antigen [PSA]: Secondary | ICD-10-CM | POA: Diagnosis not present

## 2018-03-12 DIAGNOSIS — K5090 Crohn's disease, unspecified, without complications: Secondary | ICD-10-CM | POA: Diagnosis not present

## 2018-03-12 DIAGNOSIS — M818 Other osteoporosis without current pathological fracture: Secondary | ICD-10-CM

## 2018-03-12 DIAGNOSIS — I2102 ST elevation (STEMI) myocardial infarction involving left anterior descending coronary artery: Secondary | ICD-10-CM | POA: Diagnosis not present

## 2018-03-12 DIAGNOSIS — E7800 Pure hypercholesterolemia, unspecified: Secondary | ICD-10-CM

## 2018-03-12 DIAGNOSIS — I241 Dressler's syndrome: Secondary | ICD-10-CM

## 2018-03-12 DIAGNOSIS — D721 Eosinophilia: Secondary | ICD-10-CM

## 2018-03-12 DIAGNOSIS — Z955 Presence of coronary angioplasty implant and graft: Secondary | ICD-10-CM

## 2018-03-12 MED ORDER — PANTOPRAZOLE SODIUM 40 MG PO TBEC: 40.0000 mg | DELAYED_RELEASE_TABLET | Freq: Two times a day (BID) | ORAL | 6 refills | Status: DC

## 2018-03-12 NOTE — Patient Instructions (Addendum)
Good to see you today. Keep all follow up appointments. I've refilled protonix (pantoprazole 5m) twice daily until you see Dr PHilarie Fredrickson  Return next week for labs if not done at cardiology (kidney and potassium check).

## 2018-03-13 ENCOUNTER — Telehealth: Payer: Self-pay | Admitting: Cardiology

## 2018-03-13 ENCOUNTER — Other Ambulatory Visit: Payer: Self-pay | Admitting: Cardiology

## 2018-03-13 ENCOUNTER — Telehealth: Payer: Self-pay | Admitting: Family Medicine

## 2018-03-13 ENCOUNTER — Encounter: Payer: Self-pay | Admitting: Family Medicine

## 2018-03-13 DIAGNOSIS — Z79899 Other long term (current) drug therapy: Secondary | ICD-10-CM

## 2018-03-13 DIAGNOSIS — Z955 Presence of coronary angioplasty implant and graft: Secondary | ICD-10-CM | POA: Insufficient documentation

## 2018-03-13 DIAGNOSIS — I255 Ischemic cardiomyopathy: Secondary | ICD-10-CM | POA: Insufficient documentation

## 2018-03-13 DIAGNOSIS — I5022 Chronic systolic (congestive) heart failure: Secondary | ICD-10-CM | POA: Diagnosis present

## 2018-03-13 NOTE — Assessment & Plan Note (Signed)
Now on lipitor 84m daily.

## 2018-03-13 NOTE — Assessment & Plan Note (Signed)
Followed by Dr [name]. On entyvio and prednisone 62m daily - undergoing very slow extended taper.

## 2018-03-13 NOTE — Telephone Encounter (Signed)
Call to discuss PSA as well as f/u labs next week and mychart usage.

## 2018-03-13 NOTE — Assessment & Plan Note (Addendum)
Current regimen is spironolactone 12.46m daily, losartan 222mdaily and carvedilol 3.12533mid. Spironolactone is newest addition - I have asked him to return next week for BMP. Will also check prostate.

## 2018-03-13 NOTE — Assessment & Plan Note (Signed)
Takes calcium/vit D. Also on fosamax weekly.

## 2018-03-13 NOTE — Assessment & Plan Note (Signed)
On colchicine 0.12m daily.

## 2018-03-13 NOTE — Addendum Note (Signed)
Addended by: [name] on: 03/13/2018 10:08 AM   Modules accepted: Orders

## 2018-03-13 NOTE — Assessment & Plan Note (Signed)
Recently sustained LAD STEMI s/p DES, closely followed by cardiology (Dr Tamala Julian). Now on plavix (aspirin was stopped after GIB).

## 2018-03-13 NOTE — Assessment & Plan Note (Signed)
Seen by GI during latest hospitalization after rectal bleed, aspirin stopped. Coumadin and plavix were continued. rec protonix BID indefinitely - he has been taking once daily. Advised increase to BID and f/u with Dr [name] next month.

## 2018-03-13 NOTE — Telephone Encounter (Signed)
Paged by pt wife who states that the patient is having some mild SOB and fatigue which began yesterday afternoon. Will increase Spiro to 29m QD (is taking 12.568mnow). Will have him come to the office for a BMET on 03/18/18 when he will be there for an INR check as well. He has a follow up visit with LoTruitt Merlen 04/06/2018 and will send a staff message to try and mover this appointment closer. He denies chest pain and is stable. He is trying to avoid coming to the ED as their co-pay is $500.   JiKathyrn DrownP-C HeRaysalager: [phone]

## 2018-03-13 NOTE — Assessment & Plan Note (Addendum)
I don't see where we ever received results of last PSA which seems to have been drawn 09/2017. Will need this rechecked next at week labs. If persistently elevated will need uro eval. Known h/o BPH on flomax but PSA has been elevated.

## 2018-03-13 NOTE — Assessment & Plan Note (Signed)
Continue coumadin - followed by cardiology coumadin clinic.

## 2018-03-15 ENCOUNTER — Encounter: Payer: Self-pay | Admitting: *Deleted

## 2018-03-15 ENCOUNTER — Telehealth: Payer: Self-pay

## 2018-03-15 ENCOUNTER — Other Ambulatory Visit: Payer: BLUE CROSS/BLUE SHIELD

## 2018-03-15 NOTE — Progress Notes (Signed)
Dr Ellene Route [facility] [address] [place] [address] [phone]  04/21/18 @230P 

## 2018-03-15 NOTE — Telephone Encounter (Signed)
Spoke with the patients wife and she was given appt with Melina Copa PA for 03/18/18 at 2pm same day as his Coumadin Clinic appt. She agreed and will call sooner if any problems.

## 2018-03-16 LAB — PROSTATE-SPECIFIC AG, SERUM (LABCORP): Prostate Specific Ag, Serum: 4.5 ng/mL — ABNORMAL HIGH (ref 0.0–4.0)

## 2018-03-17 ENCOUNTER — Encounter: Payer: Self-pay | Admitting: Physician Assistant

## 2018-03-18 ENCOUNTER — Ambulatory Visit (INDEPENDENT_AMBULATORY_CARE_PROVIDER_SITE_OTHER): Payer: BLUE CROSS/BLUE SHIELD | Admitting: *Deleted

## 2018-03-18 ENCOUNTER — Encounter: Payer: Self-pay | Admitting: Physician Assistant

## 2018-03-18 ENCOUNTER — Ambulatory Visit: Payer: BLUE CROSS/BLUE SHIELD | Admitting: Physician Assistant

## 2018-03-18 VITALS — BP 108/72 | HR 75 | Ht 66.5 in | Wt 173.0 lb

## 2018-03-18 DIAGNOSIS — Z5181 Encounter for therapeutic drug level monitoring: Secondary | ICD-10-CM

## 2018-03-18 DIAGNOSIS — I236 Thrombosis of atrium, auricular appendage, and ventricle as current complications following acute myocardial infarction: Secondary | ICD-10-CM | POA: Diagnosis not present

## 2018-03-18 DIAGNOSIS — R0602 Shortness of breath: Secondary | ICD-10-CM

## 2018-03-18 DIAGNOSIS — I241 Dressler's syndrome: Secondary | ICD-10-CM

## 2018-03-18 DIAGNOSIS — I5022 Chronic systolic (congestive) heart failure: Secondary | ICD-10-CM | POA: Diagnosis not present

## 2018-03-18 DIAGNOSIS — N289 Disorder of kidney and ureter, unspecified: Secondary | ICD-10-CM

## 2018-03-18 DIAGNOSIS — Z7901 Long term (current) use of anticoagulants: Secondary | ICD-10-CM | POA: Diagnosis not present

## 2018-03-18 DIAGNOSIS — I255 Ischemic cardiomyopathy: Secondary | ICD-10-CM | POA: Diagnosis not present

## 2018-03-18 DIAGNOSIS — I2510 Atherosclerotic heart disease of native coronary artery without angina pectoris: Secondary | ICD-10-CM | POA: Diagnosis not present

## 2018-03-18 DIAGNOSIS — E785 Hyperlipidemia, unspecified: Secondary | ICD-10-CM

## 2018-03-18 DIAGNOSIS — I10 Essential (primary) hypertension: Secondary | ICD-10-CM

## 2018-03-18 LAB — POCT INR: INR: 2.2 (ref 2.0–3.0)

## 2018-03-18 NOTE — Patient Instructions (Addendum)
Medication Instructions:  None ordered If you need a refill on your cardiac medications before your next appointment, please call your pharmacy.   Lab work: TODAY:  BMET, CBC, & PRO BNP  If you have labs (blood work) drawn today and your tests are completely normal, you will receive your results only by: [name] MyChart Message (if you have MyChart) OR . A paper copy in the mail If you have any lab test that is abnormal or we need to change your treatment, we will call you to review the results.  Testing/Procedures: Your physician has requested that you have an limited echocardiogram AFTER CHRISTMAS. Echocardiography is a painless test that uses sound waves to create images of your heart. It provides your doctor with information about the size and shape of your heart and how well your heart's chambers and valves are working. This procedure takes approximately one hour. There are no restrictions for this procedure.    Follow-Up: Your physician recommends that you schedule a follow-up appointment in: [place] TEAM   Any Other Special Instructions Will Be Listed Below (If Applicable).  Echocardiogram An echocardiogram is a procedure that uses painless sound waves (ultrasound) to produce an image of the heart. Images from an echocardiogram can provide important information about:  Signs of coronary artery disease (CAD).  Aneurysm detection. An aneurysm is a weak or damaged part of an artery wall that bulges out from the normal force of blood pumping through the body.  Heart size and shape. Changes in the size or shape of the heart can be associated with certain conditions, including heart failure, aneurysm, and CAD.  Heart muscle function.  Heart valve function.  Signs of a past heart attack.  Fluid buildup around the heart.  Thickening of the heart muscle.  A tumor or infectious growth around the heart valves. Tell a health care provider about:  Any  allergies you have.  All medicines you are taking, including vitamins, herbs, eye drops, creams, and over-the-counter medicines.  Any blood disorders you have.  Any surgeries you have had.  Any medical conditions you have.  Whether you are pregnant or may be pregnant. What are the risks? Generally, this is a safe procedure. However, problems may occur, including:  Allergic reaction to dye (contrast) that may be used during the procedure. What happens before the procedure? No specific preparation is needed. You may eat and drink normally. What happens during the procedure?   An IV tube may be inserted into one of your veins.  You may receive contrast through this tube. A contrast is an injection that improves the quality of the pictures from your heart.  A gel will be applied to your chest.  A wand-like tool (transducer) will be moved over your chest. The gel will help to transmit the sound waves from the transducer.  The sound waves will harmlessly bounce off of your heart to allow the heart images to be captured in real-time motion. The images will be recorded on a computer. The procedure may vary among health care providers and hospitals. What happens after the procedure?  You may return to your normal, everyday life, including diet, activities, and medicines, unless your health care provider tells you not to do that. Summary  An echocardiogram is a procedure that uses painless sound waves (ultrasound) to produce an image of the heart.  Images from an echocardiogram can provide important information about the size and shape of your heart,  heart muscle function, heart valve function, and fluid buildup around your heart.  You do not need to do anything to prepare before this procedure. You may eat and drink normally.  After the echocardiogram is completed, you may return to your normal, everyday life, unless your health care provider tells you not to do that. This  information is not intended to replace advice given to you by your health care provider. Make sure you discuss any questions you have with your health care provider. Document Released: 03/14/2000 Document Revised: 04/19/2016 Document Reviewed: 04/19/2016 Elsevier Interactive Patient Education  2019 [employer].

## 2018-03-18 NOTE — Patient Instructions (Signed)
Description   Continue taking 69m daily except 2.525mon Tuesdays, Thursdays and Saturdays.  Recheck INR in 1 week. Coumadin Clinic #[phone] #[phone]

## 2018-03-18 NOTE — Progress Notes (Addendum)
Cardiology Office Note    Date:  03/18/2018  ID:  RAJINDER MESICK, DOB 02/08/1966, MRN [id number] PCP:  [name], MD  Cardiologist:  Sinclair Grooms, MD   Chief Complaint: f/u SOB  History of Present Illness:  CARLE FENECH is a 52 y.o. male with history of systemic vasculitis, eosinophilic granulomatosis with polyangitis, Crohn's disease, essential hypertension, sciatica, prior renal insufficiency and recent complex admission for CAD/anterior STEMI with acute systolic CHF/ischemic cardiomyopathy, LV thrombus, and post-MI pericarditis. He was admitted 02/20/18 with severe crushing chest discomfort with associated nausea and diaphoresis. The pain started about 5 hours after he took Cialis and while he was engaged in intercourse. In retrospect they recalled the day prior he experienced exacerbation of his chronic leg pain which was followed by intense SOB for a period of time. On 02/20/18, EMS EKG demonstrated anterior STEMI. Emergent cardiac catheterization which showed proximal occlusion of LAD, treated with DES. Cath also showed 20% distal LM, 95% ostial-prox small-moderate ramus, 70-80% ostial Cx, 70% dominant ostial OM, 50-60% prox Cx, 70% RCA. EF 35% by cath with LVEDP 39mHg. Medical therapy was recommended for his residual disease. He did go back for relook catheterization on 02/21/2018 due to recurrent CP which showed stable anatomy with patent stent in LAD. Treatment for suspected pericarditis was recommended. He was sarted on colchicine for 3 months. He was initially started on dual antiplatelet therapy with Aspirin and Brilinta but this was changed to Plavix after he was found to have LV thrombus requiring heparin -> Coumadin bridge. His 2D echo 02/22/18 had shown EF 35-40%, multiple wall motion abnormalities, normal RV, no pericardial effusion but probable LV apical thrombus - Dr. VIrish Lackpersonally reviewed the echo film and felt his EF was more likely 30-35%, therefore  LifeVest was recommended. He was discharged with Lovenox->Coumadin bridge. At f/u 03/04/18 he was felt to be stable but visit was extended due to discussion of numerous questions. Colchicine was reduced to daily given his concomitant statin interaction. He had previously been discharged with Lasix PRN but they thought it was supposed to be taken regularly. BMET at that time showed AKI with elevated BUN/Cr so this was stopped.   [facility] Hemoglobin remained relatively stable in the 12 range. Cr was in the 1.3 range. He was seen by GI and discharged on PPI. They did not plan to proceed with acute endoscopic eval. Cardiology team cleared him to stop ASA. I reviewed his updated case with Dr. STamala Julianafter this admission. [facility] but Dr. STamala Julianadvised against this given complex admission. He was advised not to drive given LifeVest, but Dr. STamala Juliandid agree with getting a limited echo 1 month post-MI (so aorund Christmas) and if EF was >35%, would be cleared to drive. Dr. STamala Julianalso advised starting spironolactone with f/u BMET planned. F/u Hgb on 03/10/18 was in 14 range, improved. Not clear why he did not come in for 12/16 BMET. Per phone note by JKathyrn Drown12/14/19, he was having some increased SOB so spironolactone was further increased to 240mdaily with BMET recommended at today's visit. Otherwise recent labs showed LDL 166, A1C 5.2, BNP wnl, TSH wnl, LFTS OK.  He returns for follow-up overall feeling better from a cardiac standpoint. The increase in spironolactone did help his shortness of breath. He feels generally fatigued but this is also in the setting  of persistent chronic R leg pain which sometimes interferes with inability to sleep. No chest pain, orthopnea, LEE. He has occasionally noticed slower stream of urine as well. No further bleeding. Wife reports his belly sometimes looks bloated  but patient does not particularly notice this. He does have mild orthostasis upon standing. No falls. They just walked around [employer] for a while before this visit and he did fairly well. At the end he did have to stop and rest and go to the car with fatigue. He thinks this will help his sleep tonight because walking always makes his legs better. He is now agreeable to cardiac rehab. He sees a spine specialist in January.   Past Medical History:  Diagnosis Date  . BPH (benign prostatic hypertrophy)   . CAD (coronary artery disease)    a. anterior STEMI 01/2018 -  proximal occlusion of LAD, treated with DES. Cath also showed 20% distal LM, 95% ostial-prox small-moderate ramus, 70-80% ostial Cx, 70% dominant ostial OM, 50-60% prox Cx, 70% RCA. EF 35% by cath with LVEDP 56mHg. Med rx for residual disease. Course complicated by post MI pericarditis and LV thrombus.  . Chronic systolic (congestive) heart failure ([place]   . Colitis   . Colon polyp    inflammatory  . Cro[place] disease ([place] 1992   history uveitis, involvement of intestines and lungs  . Diverticulosis   . Eosinophilic granuloma ([place]   . Essential hypertension   . GERD (gastroesophageal reflux disease)   . History of chicken pox   . History of gastroesophageal reflux (GERD)   . History of seizure 1995   grand mal x1, completed 6 yrs dilantin. no seizures since  . Hyperlipidemia   . Internal hemorrhoids   . Ischemic cardiomyopathy   . LV (left ventricular) mural thrombus following MI ([place] 01/2018  . Osteoporosis 11/2015   DEXA T -2.9  . Schwannoma 2007   L axilla s/p surgery  . [place]izures ([place]    last seizure 1995 and only x 1 seizure-   . Ulnar neuropathy    h/o this from L arm schwannoma    Past Surgical History:  Procedure Laterality Date  . APPENDECTOMY  2000  . CARDIOVASCULAR STRESS TEST  01/2015   low risk study  . CHOLECYSTECTOMY  2000  . COLONOSCOPY  08/2014   f/u crohn's, 4 inflammatory polyps, diverticulosis,  improved, rpt 2 yrs ([place])  . COLONOSCOPY  01/2015   during hospitalization - active ileitis/colitis  . CORONARY ANGIOGRAPHY N/A 02/21/2018   Procedure: CORONARY ANGIOGRAPHY;  Surgeon: SBelva Crome MD;  [facility];  Service: Cardiovascular;  Laterality: N/A;  . CORONARY/GRAFT ACUTE MI REVASCULARIZATION N/A 02/20/2018   Procedure: Coronary/Graft Acute MI Revascularization;  Surgeon: SBelva Crome MD;  [facility];  Service: Cardiovascular;  Laterality: N/A;  . ESOPHAGOGASTRODUODENOSCOPY  07/2014   h/o EE resolved, focal reflux esophagitis, chronic active gastritis  . extremity surgery Left    ulnar nerve repair after schwannoma removal  . INGUINAL HERNIA REPAIR Bilateral 2017  . LEFT HEART CATH AND CORONARY ANGIOGRAPHY N/A 02/20/2018   Procedure: LEFT HEART CATH AND CORONARY ANGIOGRAPHY;  Surgeon: SBelva Crome MD;  [facility];  Service: Cardiovascular;  Laterality: N/A;  . LUMBAR SPINE SURGERY  2011  . POLYPECTOMY    . SPINAL CORD STIMULATOR IMPLANT  2012   x2 (Dr [place]  . TONSILLECTOMY  1996  . TUMOR REMOVAL Left 2007   schwannoma from  L armpit  . UPPER GASTROINTESTINAL ENDOSCOPY      Current Medications: Current Meds  Medication Sig  . acetaminophen (TYLENOL) 325 MG tablet Take 650 mg by mouth every 6 (six) hours as needed for mild pain or headache.   . alendronate (FOSAMAX) 70 MG tablet Take 70 mg by mouth every Saturday.   [name] atorvastatin (LIPITOR) 80 MG tablet Take 1 tablet (80 mg total) by mouth daily at 6 PM.  . Calcium Carbonate-Vit D-Min (CALCIUM 1200 PO) Take 1 capsule by mouth 2 (two) times daily.  . carvedilol (COREG) 3.125 MG tablet Take 1 tablet (3.125 mg total) by mouth 2 (two) times daily with a meal.  . cetirizine (ZYRTEC) 10 MG tablet Take 10 mg by mouth daily.   . clopidogrel (PLAVIX) 75 MG tablet Take 1 tablet (75 mg total) by mouth daily.  . colchicine 0.6 MG tablet Take 1 tablet (0.6 mg total) by  mouth daily.  . diphenoxylate-atropine (LOMOTIL) 2.5-0.025 MG tablet Take 1-2 tablets by mouth 4 times daily as needed for diarrhea  . gabapentin (NEURONTIN) 300 MG capsule Take one tab QHS for 4 days, then one tab BID for 4 days, then one tab TID  . hydrOXYzine (ATARAX/VISTARIL) 25 MG tablet TAKE 4 TABLETS (100 MG TOTAL) BY MOUTH AT BEDTIME.  [name] losartan (COZAAR) 25 MG tablet Take 1 tablet (25 mg total) by mouth daily.  . Multiple Vitamin (MULTIVITAMIN) capsule Take 1 capsule by mouth daily.  . nitroGLYCERIN (NITROSTAT) 0.4 MG SL tablet Place 1 tablet (0.4 mg total) under the tongue every 5 (five) minutes as needed for chest pain.  . pantoprazole (PROTONIX) 40 MG tablet Take 1 tablet (40 mg total) by mouth 2 (two) times daily.  . predniSONE (DELTASONE) 1 MG tablet Take 3 tablets (3 mg total) by mouth daily with breakfast. Total 3m daily  . predniSONE (DELTASONE) 10 MG tablet Take 1 tablet (10 mg total) by mouth See admin instructions. Take 116mwith (8) 34m74mablets to equal 18 mg by mouth once daily  . predniSONE (DELTASONE) 5 MG tablet Take 1 tablet by mouth daily. Takes with 10 mg and 3- 1 mg tabs (18 mg total)  . spironolactone (ALDACTONE) 25 MG tablet Take 25 mg by mouth daily.  . tamsulosin (FLOMAX) 0.4 MG CAPS capsule TAKE 1 CAPSULE (0.4 MG TOTAL) BY MOUTH DAILY.  . vedolizumab (ENTYVIO) 300 MG injection Inject 300 mg into the vein every 8 (eight) weeks.   . wMarland Kitchenrfarin (COUMADIN) 5 MG tablet Take 1 tablet (5 mg total) by mouth daily. Please do not take this on 12/9 INR is 2.91 plesae take 2.5 mg on 12/10 Have INR checked then PCP can adjust thanks    Allergies:   Cephalexin and Duloxetine   Social History   Socioeconomic History  . Marital status: Married    Spouse name: Not on file  . Number of children: Not on file  . Years of education: Not on file  . Highest education level: Not on file  Occupational History  . Not on file  Social Needs  . Financial resource strain: Not on  file  . Food insecurity:    Worry: Not on file    Inability: Not on file  . Transportation needs:    Medical: Not on file    Non-medical: Not on file  Tobacco Use  . Smoking status: Former Smoker    Types: Cigars    Last attempt to quit: 02/20/2018    Years since quitting:  0.0  . Smokeless tobacco: Never Used  Substance and Sexual Activity  . Alcohol use: Yes    Alcohol/week: 7.0 standard drinks    Types: 7 Cans of beer per week    Comment: 1 beer/day  . Drug use: No  . Sexual activity: Not on file  Lifestyle  . Physical activity:    Days per week: Not on file    Minutes per session: Not on file  . Stress: Not on file  Relationships  . Social connections:    Talks on phone: Not on file    Gets together: Not on file    Attends religious service: Not on file    Active member of club or organization: Not on file    Attends meetings of clubs or organizations: Not on file    Relationship status: Not on file  Other Topics Concern  . Not on file  Social History Narrative   Lives with wife, 1 dog. Grown children   Edu: college   Occ: owns mattress store   Activity: active at work, started [employer], wants to restart running   Diet: good water, fruits/vegetables daily     Family History:  The patient's family history includes CAD (age of onset: 33) in his father and mother; Cancer in his maternal grandmother; Colon cancer in his maternal grandfather; Congenital heart disease in his mother; Hyperlipidemia in his father and mother; Hypertension in his father and mother. There is no history of Diabetes, Colon polyps, Esophageal cancer, Rectal cancer, or Stomach cancer.  ROS:   Please see the history of present illness. All other systems are reviewed and otherwise negative.    PHYSICAL EXAM:   VS:  BP 108/72   Pulse 75   Ht 5' 6.5" (1.689 m)   Wt 173 lb (78.5 kg)   SpO2 97%   BMI 27.50 kg/m   BMI: Body mass index is 27.5 kg/m. GEN: Well nourished, well developed WM, in  no acute distress HEENT: normocephalic, atraumatic Neck: no JVD, carotid bruits, or masses Cardiac: RRR; no murmurs, rubs, or gallops, no edema  Respiratory:  clear to auscultation bilaterally, normal work of breathing GI: soft, nontender, nondistended, + BS MS: no deformity or atrophy Skin: warm and dry, no rash Neuro:  Alert and Oriented x 3, Strength and sensation are intact, follows commands Psych: euthymic mood, full affect  Wt Readings from Last 3 Encounters:  03/18/18 173 lb (78.5 kg)  03/12/18 172 lb (78 kg)  03/08/18 168 lb 4.8 oz (76.3 kg)      Studies/Labs Reviewed:   EKG:  EKG was ordered today and personally reviewed by me and demonstrates NSR 75bpm, right atrial enlargement, septal infarct age undetermined, diffuse ST-T changes suspected due to evolution of MI  Recent Labs: 02/20/2018: B Natriuretic Peptide 14.9; TSH 1.467 03/06/2018: ALT 58 03/08/2018: BUN 22; Creatinine, Ser 1.36; Magnesium 2.1; Potassium 4.5; Sodium 141 03/10/2018: Hemoglobin 14.2; Platelets 384   Lipid Panel    Component Value Date/Time   CHOL 224 (H) 02/20/2018 2345   TRIG 40 02/20/2018 2345   HDL 50 02/20/2018 2345   CHOLHDL 4.5 02/20/2018 2345   VLDL 8 02/20/2018 2345   LDLCALC 166 (H) 02/20/2018 2345    Additional studies/ records that were reviewed today include: Summarized above   ASSESSMENT & PLAN:   1. CAD - ASA was stopped during recent admission due to rectal bleeding. Continue Plavix. No recurrent angina. Continue BB, statin. 2. Post-MI Pericarditis - anticipate continuing colchicine for  3 months total post MI (late Feb 2020). This is only once daily due to concomitant statin. He is on prednisone but for unrelated reasons chronically. 3. Ischemic cardiomyopathy/Chronic systolic CHF - Lasix recently stopped due to AKI, spiro added and titrated. He called in with repeated SOB the other day but this improved with titration of spironolacotne. His weight is up a few lb from prior  visit but had multiple meds adjusted recently, with some dehydration at prior visit requiring cessation fo Lasix. Trend pBNP given vague symptoms above. Need to be cautious with orthostasis. Check BMET and CBC today as well. Check limited echo after 03/22/18. If EF is 40% or greater, OK to dc Lifevest and resume driving. We reviewed HF guidelines last visit. Given his tendency to lower BP would not switch to Entresto at this time. I had reviewed this with Dr. Tamala Julian previously and our hope is for LV improvement s/p PCI anyway. 4. Renal insufficiency - recheck today. 5. LV thrombus - continue coumadin per pharmacy. Goal INR is closer to 2 given hx of rectal bleeding. CBC stable in f/u. 6. HTN - low-normal. He has mild orthostasis but in general seems to be tolerating current regimen OK. Long term this may be a fine line to balance meds for cardiomyopathy. 7. Hyperlipidemia - lipid recheck planned for 03/2018 already.  Disposition: F/u with Dr. Benjaman Lobe team within 6 weeks. The patient remains concerned on his chronic right leg pain. A spine specialist is in the works for January but he inquired about relief in the meantime. I do not think we have anything to offer from a cardiac standpoint but the pain is clearly weighing on him and his ability to sleep, so I strongly advised him to see his primary care to review this (as well as slower urinary stream) ASAP. This note will be routed to PCP as well.  Medication Adjustments/Labs and Tests Ordered: Current medicines are reviewed at length with the patient today.  Concerns regarding medicines are outlined above. Medication changes, Labs and Tests ordered today are summarized above and listed in the Patient Instructions accessible in Encounters.   Signed, Charlie Pitter, PA-C  03/18/2018 2:23 PM    [facility] [place], [place], [place]  [address] Phone: [phone]; Fax: [phone]

## 2018-03-19 ENCOUNTER — Other Ambulatory Visit: Payer: Self-pay | Admitting: Pharmacist

## 2018-03-19 ENCOUNTER — Telehealth: Payer: Self-pay | Admitting: *Deleted

## 2018-03-19 DIAGNOSIS — Z79899 Other long term (current) drug therapy: Secondary | ICD-10-CM

## 2018-03-19 LAB — BASIC METABOLIC PANEL
BUN/Creatinine Ratio: 25 — ABNORMAL HIGH (ref 9–20)
BUN: 30 mg/dL — ABNORMAL HIGH (ref 6–24)
CO2: 26 mmol/L (ref 20–29)
Calcium: 9.9 mg/dL (ref 8.7–10.2)
Chloride: 100 mmol/L (ref 96–106)
Creatinine, Ser: 1.22 mg/dL (ref 0.76–1.27)
GFR calc Af Amer: 79 mL/min/{1.73_m2} (ref >59)
GFR calc non Af Amer: 69 mL/min/{1.73_m2} (ref >59)
Glucose: 104 mg/dL — ABNORMAL HIGH (ref 65–99)
Potassium: 5.4 mmol/L — ABNORMAL HIGH (ref 3.5–5.2)
Sodium: 141 mmol/L (ref 134–144)

## 2018-03-19 LAB — CBC
Hematocrit: 44.5 % (ref 37.5–51.0)
Hemoglobin: 14.4 g/dL (ref 13.0–17.7)
MCH: 29.1 pg (ref 26.6–33.0)
MCHC: 32.4 g/dL (ref 31.5–35.7)
MCV: 90 fL (ref 79–97)
Platelets: 310 10*3/uL (ref 150–450)
RBC: 4.95 x10E6/uL (ref 4.14–5.80)
RDW: 12.7 % (ref 12.3–15.4)
WBC: 16.1 10*3/uL — AB (ref 3.4–10.8)

## 2018-03-19 LAB — PRO B NATRIURETIC PEPTIDE: NT-Pro BNP: 2934 pg/mL — ABNORMAL HIGH (ref 0–121)

## 2018-03-19 MED ORDER — WARFARIN SODIUM 5 MG PO TABS: ORAL_TABLET | ORAL | 1 refills | Status: DC

## 2018-03-19 MED ORDER — FUROSEMIDE 20 MG PO TABS: ORAL_TABLET | ORAL | 3 refills | Status: DC

## 2018-03-19 MED ORDER — SPIRONOLACTONE 25 MG PO TABS: 12.5000 mg | ORAL_TABLET | Freq: Every day | ORAL | 3 refills | Status: DC

## 2018-03-19 NOTE — Telephone Encounter (Signed)
Called pt re: lab results, left message for pt to call back.

## 2018-03-19 NOTE — Telephone Encounter (Signed)
-----   Message from Charlie Pitter, [place] sent at 03/19/2018  8:00 AM EST ----- Actually, edited to add: let's have him do Lasix 65m daily x 2 days, with recheck BNP on Monday as well. I reviewed weights and want to make sure we are staying on top of any volume increase. Dayna Dunn PA-C

## 2018-03-21 ENCOUNTER — Encounter: Payer: Self-pay | Admitting: Family Medicine

## 2018-03-21 NOTE — Progress Notes (Signed)
BP 116/64 (BP Location: Left Arm, Patient Position: Sitting, Cuff Size: Normal)   Pulse 69   Temp 97.9 F (36.6 C) (Oral)   Ht 5' 6.5" (1.689 m)   Wt 173 lb 8 oz (78.7 kg)   SpO2 99%   BMI 27.58 kg/m    CC: f/u abnormal labs Subjective:    Patient ID: Jack [name], male    DOB: 1965/04/25, 52 y.o.   MRN: [id number]  HPI: Jack [name] is a 52 y.o. male presenting on 03/22/2018 for Results (Here for f/u on elevated WBC. ) and Leg Pain (C/o pain in right leg. Wants to discuss gabapentin. )   Leukocytosis present longstanding. On prednisone longstanding as well. Denies symptoms of infection including red painful skin rash, cough, fevers/chills, diarrhea, dysuria, headache, sinus congestion.   R calf pain longstanding presumed lumbar radiculopathy in origin, has been evaluated with venous US of LE, lumbar MRI by sports medicine - currently managed with 349m/600mg gabapentin daily. Increased pain with too much or too little activity. Pain stays in calf. No back pain. Has 2 spinal cord stimulators in place to lower back. Has upcoming appt with NSG 03/2018. Worse pain sitting on hard surfaces, worse pain when he's barefoot.   Lyrica worsened irritability and caused anger issues.  Known churg strauss eosinophilic vasculitis.   Relevant past medical, surgical, family and social history reviewed and updated as indicated. Interim medical history since our last visit reviewed. Allergies and medications reviewed and updated. Outpatient Medications Prior to Visit  Medication Sig Dispense Refill  . acetaminophen (TYLENOL) 325 MG tablet Take 650 mg by mouth every 6 (six) hours as needed for mild pain or headache.     . alendronate (FOSAMAX) 70 MG tablet Take 70 mg by mouth every Saturday.   11  . atorvastatin (LIPITOR) 80 MG tablet Take 1 tablet (80 mg total) by mouth daily at 6 PM. 30 tablet 3  . Calcium Carbonate-Vit D-Min (CALCIUM 1200 PO) Take 1 capsule by mouth 2 (two) times daily.     . carvedilol (COREG) 3.125 MG tablet Take 1 tablet (3.125 mg total) by mouth 2 (two) times daily with a meal. 60 tablet 3  . cetirizine (ZYRTEC) 10 MG tablet Take 10 mg by mouth daily.     . clopidogrel (PLAVIX) 75 MG tablet Take 1 tablet (75 mg total) by mouth daily. 30 tablet 12  . colchicine 0.6 MG tablet Take 1 tablet (0.6 mg total) by mouth daily. 90 tablet 3  . diphenoxylate-atropine (LOMOTIL) 2.5-0.025 MG tablet Take 1-2 tablets by mouth 4 times daily as needed for diarrhea    . furosemide (LASIX) 20 MG tablet Take only as needed 90 tablet 3  . gabapentin (NEURONTIN) 300 MG capsule Take one tab QHS for 4 days, then one tab BID for 4 days, then one tab TID 90 capsule 1  . hydrOXYzine (ATARAX/VISTARIL) 25 MG tablet TAKE 4 TABLETS (100 MG TOTAL) BY MOUTH AT BEDTIME. 360 tablet 2  . losartan (COZAAR) 25 MG tablet Take 1 tablet (25 mg total) by mouth daily. 60 tablet 3  . Multiple Vitamin (MULTIVITAMIN) capsule Take 1 capsule by mouth daily.    . nitroGLYCERIN (NITROSTAT) 0.4 MG SL tablet Place 1 tablet (0.4 mg total) under the tongue every 5 (five) minutes as needed for chest pain. 25 tablet 12  . pantoprazole (PROTONIX) 40 MG tablet Take 1 tablet (40 mg total) by mouth 2 (two) times daily. 60 tablet 6  . predniSONE (  DELTASONE) 1 MG tablet Take 3 tablets (3 mg total) by mouth daily with breakfast. Total 76m daily    . predniSONE (DELTASONE) 10 MG tablet Take 1 tablet (10 mg total) by mouth See admin instructions. Take 182mwith (8) 12m57mablets to equal 18 mg by mouth once daily    . predniSONE (DELTASONE) 5 MG tablet Take 1 tablet by mouth daily. Takes with 10 mg and 3- 1 mg tabs (18 mg total)  2  . spironolactone (ALDACTONE) 25 MG tablet Take 0.5 tablets (12.5 mg total) by mouth daily. 90 tablet 3  . tamsulosin (FLOMAX) 0.4 MG CAPS capsule TAKE 1 CAPSULE (0.4 MG TOTAL) BY MOUTH DAILY. 30 capsule 11  . vedolizumab (ENTYVIO) 300 MG injection Inject 300 mg into the vein every 8 (eight) weeks.      . wMarland Kitchenrfarin (COUMADIN) 5 MG tablet Take as directed by Coumadin clinic 30 tablet 1   No facility-administered medications prior to visit.      Per HPI unless specifically indicated in ROS section below Review of Systems     Objective:    BP 116/64 (BP Location: Left Arm, Patient Position: Sitting, Cuff Size: Normal)   Pulse 69   Temp 97.9 F (36.6 C) (Oral)   Ht 5' 6.5" (1.689 m)   Wt 173 lb 8 oz (78.7 kg)   SpO2 99%   BMI 27.58 kg/m   Wt Readings from Last 3 Encounters:  03/22/18 173 lb 8 oz (78.7 kg)  03/18/18 173 lb (78.5 kg)  03/12/18 172 lb (78 kg)    Physical Exam Vitals signs and nursing note reviewed.  Constitutional:      General: He is not in acute distress.    Appearance: Normal appearance.  Neurological:     Mental Status: He is alert.  Psychiatric:        Mood and Affect: Mood normal.        Behavior: Behavior normal.    Results for orders placed or performed in visit on 03/22/18  CBC with Differential/Platelet  Result Value Ref Range   WBC 13.0 (H) 3.8 - 10.8 Thousand/uL   RBC 4.91 4.20 - 5.80 Million/uL   Hemoglobin 14.2 13.2 - 17.1 g/dL   HCT 43.1 38.5 - 50.0 %   MCV 87.8 80.0 - 100.0 fL   MCH 28.9 27.0 - 33.0 pg   MCHC 32.9 32.0 - 36.0 g/dL   RDW 12.3 11.0 - 15.0 %   Platelets 286 140 - 400 Thousand/uL   MPV 10.7 7.5 - 12.5 fL   Neutro Abs 9,932 (H) 1,500 - 7,800 cells/uL   Lymphs Abs 1,703 850 - 3,900 cells/uL   Absolute Monocytes 1,183 (H) 200 - 950 cells/uL   Eosinophils Absolute 117 15 - 500 cells/uL   Basophils Absolute 65 0 - 200 cells/uL   Neutrophils Relative % 76.4 %   Total Lymphocyte 13.1 %   Monocytes Relative 9.1 %   Eosinophils Relative 0.9 %   Basophils Relative 0.5 %   Lab Results  Component Value Date   PSA 7.93 (H) 03/20/2017   PSA 4.5 (03/08/2018).     Assessment & Plan:   Problem List Items Addressed This Visit    Leukocytosis - Primary    Longstanding, anticipate prednisone induced demargination. No signs of  infection. Will check CBC with differential today. Continues prednisone 26m37mily (slow taper through rheum).       Relevant Orders   Pathologist smear review   CBC with Differential/Platelet (Completed)  Elevated PSA measurement    In setting of known BPH on flomax. Latest PSA reassuringly did decrease from 7.9 to 4.5. check total/free PSA today. Recommended more frequent monitoring with PSA/DRE.      Churg-Strauss syndrome with lung involvement ([place])    Has rheum f/u next week.       Chronic leg pain    Presumed radicular pain in known lumbar disease, has had arterial and venous evaluation in the past. Discussed options - will start with changing gabapentin to earlier in the evening to see if this will help symptoms, with option to increase dose. Pt agrees with plan. Has f/u scheduled with back doctor 03/2018. Could consider TCA vs tramadol trial.       Benign prostatic hyperplasia    See above - continue flomax.       Relevant Orders   PSA, Total with Reflex to PSA, Free   CBC with Differential/Platelet (Completed)       No orders of the defined types were placed in this encounter.  Orders Placed This Encounter  Procedures  . Pathologist smear review  . PSA, Total with Reflex to PSA, Free  . CBC with Differential/Platelet   Patient Instructions  Change gabapentin to sooner in the evening to see if any better pain control.  We will check labs today for elevated white count - likely from steroid use.  PSA check today as well.    Follow up plan: No follow-ups on file.  [name], MD

## 2018-03-22 ENCOUNTER — Other Ambulatory Visit: Payer: BLUE CROSS/BLUE SHIELD

## 2018-03-22 ENCOUNTER — Encounter: Payer: Self-pay | Admitting: Family Medicine

## 2018-03-22 ENCOUNTER — Ambulatory Visit: Payer: BLUE CROSS/BLUE SHIELD | Admitting: Family Medicine

## 2018-03-22 VITALS — BP 116/64 | HR 69 | Temp 97.9°F | Ht 66.5 in | Wt 173.5 lb

## 2018-03-22 DIAGNOSIS — I5022 Chronic systolic (congestive) heart failure: Secondary | ICD-10-CM | POA: Diagnosis not present

## 2018-03-22 DIAGNOSIS — M79604 Pain in right leg: Secondary | ICD-10-CM

## 2018-03-22 DIAGNOSIS — R972 Elevated prostate specific antigen [PSA]: Secondary | ICD-10-CM | POA: Diagnosis not present

## 2018-03-22 DIAGNOSIS — D72829 Elevated white blood cell count, unspecified: Secondary | ICD-10-CM | POA: Diagnosis not present

## 2018-03-22 DIAGNOSIS — N401 Enlarged prostate with lower urinary tract symptoms: Secondary | ICD-10-CM

## 2018-03-22 DIAGNOSIS — Z79899 Other long term (current) drug therapy: Secondary | ICD-10-CM

## 2018-03-22 DIAGNOSIS — R350 Frequency of micturition: Secondary | ICD-10-CM

## 2018-03-22 DIAGNOSIS — M301 Polyarteritis with lung involvement [Churg-Strauss]: Secondary | ICD-10-CM | POA: Diagnosis not present

## 2018-03-22 DIAGNOSIS — G8929 Other chronic pain: Secondary | ICD-10-CM

## 2018-03-22 DIAGNOSIS — R0602 Shortness of breath: Secondary | ICD-10-CM | POA: Diagnosis not present

## 2018-03-22 DIAGNOSIS — D7218 Eosinophilia in diseases classified elsewhere: Secondary | ICD-10-CM

## 2018-03-22 NOTE — Patient Instructions (Addendum)
Change gabapentin to sooner in the evening to see if any better pain control.  We will check labs today for elevated white count - likely from steroid use.  PSA check today as well.

## 2018-03-23 ENCOUNTER — Telehealth: Payer: Self-pay | Admitting: *Deleted

## 2018-03-23 DIAGNOSIS — M79606 Pain in leg, unspecified: Secondary | ICD-10-CM

## 2018-03-23 DIAGNOSIS — Z79899 Other long term (current) drug therapy: Secondary | ICD-10-CM

## 2018-03-23 DIAGNOSIS — D72829 Elevated white blood cell count, unspecified: Secondary | ICD-10-CM | POA: Insufficient documentation

## 2018-03-23 DIAGNOSIS — G8929 Other chronic pain: Secondary | ICD-10-CM | POA: Insufficient documentation

## 2018-03-23 LAB — CBC WITH DIFFERENTIAL/PLATELET
Absolute Monocytes: 1183 cells/uL — ABNORMAL HIGH (ref 200–950)
Basophils Absolute: 65 cells/uL (ref 0–200)
Basophils Relative: 0.5 %
EOS ABS: 117 {cells}/uL (ref 15–500)
Eosinophils Relative: 0.9 %
HEMATOCRIT: 43.1 % (ref 38.5–50.0)
Hemoglobin: 14.2 g/dL (ref 13.2–17.1)
LYMPHS ABS: 1703 {cells}/uL (ref 850–3900)
MCH: 28.9 pg (ref 27.0–33.0)
MCHC: 32.9 g/dL (ref 32.0–36.0)
MCV: 87.8 fL (ref 80.0–100.0)
MPV: 10.7 fL (ref 7.5–12.5)
Monocytes Relative: 9.1 %
Neutro Abs: 9932 cells/uL — ABNORMAL HIGH (ref 1500–7800)
Neutrophils Relative %: 76.4 %
Platelets: 286 10*3/uL (ref 140–400)
RBC: 4.91 10*6/uL (ref 4.20–5.80)
RDW: 12.3 % (ref 11.0–15.0)
Total Lymphocyte: 13.1 %
WBC: 13 10*3/uL — ABNORMAL HIGH (ref 3.8–10.8)

## 2018-03-23 LAB — PSA, TOTAL WITH REFLEX TO PSA, FREE: PSA, Total: 4.9 ng/mL — ABNORMAL HIGH (ref <=4.0)

## 2018-03-23 LAB — BASIC METABOLIC PANEL
BUN/Creatinine Ratio: 23 — ABNORMAL HIGH (ref 9–20)
BUN: 33 mg/dL — ABNORMAL HIGH (ref 6–24)
CO2: 25 mmol/L (ref 20–29)
CREATININE: 1.44 mg/dL — AB (ref 0.76–1.27)
Calcium: 9.1 mg/dL (ref 8.7–10.2)
Chloride: 101 mmol/L (ref 96–106)
GFR, EST AFRICAN AMERICAN: 65 mL/min/{1.73_m2} (ref >59)
GFR, EST NON AFRICAN AMERICAN: 56 mL/min/{1.73_m2} — AB (ref >59)
Glucose: 107 mg/dL — ABNORMAL HIGH (ref 65–99)
Potassium: 4.9 mmol/L (ref 3.5–5.2)
Sodium: 141 mmol/L (ref 134–144)

## 2018-03-23 LAB — REFLEX PSA, FREE
PSA, % Free: 14 % (calc) — ABNORMAL LOW (ref >25)
PSA, Free: 0.7 ng/mL

## 2018-03-23 LAB — PATHOLOGIST SMEAR REVIEW

## 2018-03-23 LAB — PRO B NATRIURETIC PEPTIDE: NT-Pro BNP: 2573 pg/mL — ABNORMAL HIGH (ref 0–121)

## 2018-03-23 NOTE — Telephone Encounter (Signed)
Spoke with patient at [facility].

## 2018-03-23 NOTE — Telephone Encounter (Signed)
-----   Message from Charlie Pitter, [place] sent at 03/23/2018 10:46 AM EST ----- Please let patient know renal function appears mildly increased from prior but not too far off from his recent post-hospitalization baseline. His renal function number was 1.38 in 02/2017 so I suspect his "set point" is somewhere between 1.2-1.4. We had him do a couple extra Lasix over the weekend -  would go back to PRN weight gain, swelling or shortness of breath for now. His potassium level is normal now, but remains at upper limits of normal so he needs to review diet and limit any dietary sources of potassium-rich food. Would repeat BMET early next week to trend. Merry Christmas.  Dayna Dunn PA-C

## 2018-03-23 NOTE — Assessment & Plan Note (Addendum)
Longstanding, anticipate prednisone induced demargination. No signs of infection. Will check CBC with differential today. Continues prednisone 95m daily (slow taper through rheum).

## 2018-03-23 NOTE — Assessment & Plan Note (Signed)
Has rheum f/u next week.

## 2018-03-23 NOTE — Assessment & Plan Note (Addendum)
Presumed radicular pain in known lumbar disease, has had arterial and venous evaluation in the past. Discussed options - will start with changing gabapentin to earlier in the evening to see if this will help symptoms, with option to increase dose. Pt agrees with plan. Has f/u scheduled with back doctor 03/2018. Could consider TCA vs tramadol trial.

## 2018-03-23 NOTE — Assessment & Plan Note (Signed)
See above - continue flomax.

## 2018-03-23 NOTE — Assessment & Plan Note (Addendum)
In setting of known BPH on flomax. Latest PSA reassuringly did decrease from 7.9 to 4.5. check total/free PSA today. Recommended more frequent monitoring with PSA/DRE.

## 2018-03-25 ENCOUNTER — Other Ambulatory Visit: Payer: Self-pay | Admitting: *Deleted

## 2018-03-25 MED ORDER — GABAPENTIN 300 MG PO CAPS: ORAL_CAPSULE | ORAL | 1 refills | Status: DC

## 2018-03-26 ENCOUNTER — Other Ambulatory Visit: Payer: BLUE CROSS/BLUE SHIELD | Admitting: *Deleted

## 2018-03-26 ENCOUNTER — Ambulatory Visit (HOSPITAL_COMMUNITY): Payer: BLUE CROSS/BLUE SHIELD | Attending: Cardiovascular Disease

## 2018-03-26 ENCOUNTER — Other Ambulatory Visit: Payer: Self-pay

## 2018-03-26 ENCOUNTER — Ambulatory Visit (INDEPENDENT_AMBULATORY_CARE_PROVIDER_SITE_OTHER): Payer: BLUE CROSS/BLUE SHIELD | Admitting: Pharmacist

## 2018-03-26 DIAGNOSIS — I10 Essential (primary) hypertension: Secondary | ICD-10-CM | POA: Diagnosis not present

## 2018-03-26 DIAGNOSIS — Z79899 Other long term (current) drug therapy: Secondary | ICD-10-CM | POA: Diagnosis not present

## 2018-03-26 DIAGNOSIS — R0602 Shortness of breath: Secondary | ICD-10-CM

## 2018-03-26 DIAGNOSIS — I5022 Chronic systolic (congestive) heart failure: Secondary | ICD-10-CM | POA: Diagnosis not present

## 2018-03-26 DIAGNOSIS — I236 Thrombosis of atrium, auricular appendage, and ventricle as current complications following acute myocardial infarction: Secondary | ICD-10-CM | POA: Diagnosis not present

## 2018-03-26 DIAGNOSIS — E785 Hyperlipidemia, unspecified: Secondary | ICD-10-CM | POA: Insufficient documentation

## 2018-03-26 DIAGNOSIS — I255 Ischemic cardiomyopathy: Secondary | ICD-10-CM | POA: Insufficient documentation

## 2018-03-26 DIAGNOSIS — N289 Disorder of kidney and ureter, unspecified: Secondary | ICD-10-CM | POA: Diagnosis not present

## 2018-03-26 DIAGNOSIS — Z7901 Long term (current) use of anticoagulants: Secondary | ICD-10-CM | POA: Diagnosis not present

## 2018-03-26 DIAGNOSIS — Z5181 Encounter for therapeutic drug level monitoring: Secondary | ICD-10-CM | POA: Diagnosis not present

## 2018-03-26 DIAGNOSIS — I241 Dressler's syndrome: Secondary | ICD-10-CM

## 2018-03-26 DIAGNOSIS — I2510 Atherosclerotic heart disease of native coronary artery without angina pectoris: Secondary | ICD-10-CM | POA: Insufficient documentation

## 2018-03-26 LAB — POCT INR: INR: 2.1 (ref 2.0–3.0)

## 2018-03-26 MED ORDER — PERFLUTREN LIPID MICROSPHERE
1.0000 mL | INTRAVENOUS | Status: AC | PRN
  Administered 2018-03-26: 2 mL via INTRAVENOUS

## 2018-03-26 NOTE — Patient Instructions (Signed)
Description   Continue taking 1 tablet daily except 1/2 tablet on Tuesdays, Thursdays and Saturdays.  Recheck INR in 10 days. [facility] [phone] Main [phone]

## 2018-03-27 DIAGNOSIS — I2109 ST elevation (STEMI) myocardial infarction involving other coronary artery of anterior wall: Secondary | ICD-10-CM | POA: Diagnosis not present

## 2018-03-27 DIAGNOSIS — I509 Heart failure, unspecified: Secondary | ICD-10-CM | POA: Diagnosis not present

## 2018-03-27 LAB — BASIC METABOLIC PANEL
BUN/Creatinine Ratio: 23 — ABNORMAL HIGH (ref 9–20)
BUN: 30 mg/dL — ABNORMAL HIGH (ref 6–24)
CHLORIDE: 103 mmol/L (ref 96–106)
CO2: 24 mmol/L (ref 20–29)
Calcium: 9.4 mg/dL (ref 8.7–10.2)
Creatinine, Ser: 1.28 mg/dL — ABNORMAL HIGH (ref 0.76–1.27)
GFR calc Af Amer: 75 mL/min/{1.73_m2} (ref >59)
GFR calc non Af Amer: 65 mL/min/{1.73_m2} (ref >59)
Glucose: 94 mg/dL (ref 65–99)
POTASSIUM: 5 mmol/L (ref 3.5–5.2)
SODIUM: 139 mmol/L (ref 134–144)

## 2018-03-29 DIAGNOSIS — K5190 Ulcerative colitis, unspecified, without complications: Secondary | ICD-10-CM | POA: Diagnosis not present

## 2018-03-29 DIAGNOSIS — M810 Age-related osteoporosis without current pathological fracture: Secondary | ICD-10-CM | POA: Diagnosis not present

## 2018-03-29 DIAGNOSIS — Z886 Allergy status to analgesic agent status: Secondary | ICD-10-CM | POA: Diagnosis not present

## 2018-03-29 DIAGNOSIS — J069 Acute upper respiratory infection, unspecified: Secondary | ICD-10-CM | POA: Diagnosis not present

## 2018-03-29 DIAGNOSIS — D721 Eosinophilia: Secondary | ICD-10-CM | POA: Diagnosis not present

## 2018-03-29 DIAGNOSIS — I252 Old myocardial infarction: Secondary | ICD-10-CM | POA: Diagnosis not present

## 2018-03-29 DIAGNOSIS — K5090 Crohn's disease, unspecified, without complications: Secondary | ICD-10-CM | POA: Diagnosis not present

## 2018-03-29 DIAGNOSIS — I10 Essential (primary) hypertension: Secondary | ICD-10-CM | POA: Diagnosis not present

## 2018-03-30 ENCOUNTER — Telehealth: Payer: Self-pay | Admitting: Internal Medicine

## 2018-03-30 NOTE — Telephone Encounter (Signed)
Left message for pt that if he cannot keep fluids/foods in he may need to go to the er to avoid dehydration. Also let him know if he was placed on new meds it is possible they could cause some GI issues.

## 2018-03-30 NOTE — Telephone Encounter (Signed)
Spoke with wife, DPR on file.  She states they reached out to pt's GI MD because anytime they call PCP about these types of issues, they get directed to GI because pt has Crohn's.  She states pt has had diarrhea the last few days but today has been severe and she was worried about dehydration or him not getting an adequate amount of his meds.  Pt does have Lomotil for diarrhea.  Advised wife to have pt try taking this and to make sure he stays hydrated.  Advised ok to have Gatorade to replace electrolytes but not all day due to Na+.  Pt has no other issues and is acting normally otherwise.  Advised wife if diarrhea worsens or pt develops confusion or becomes lethargic to go to ER.  Wife verbalized understanding and was in agreement with this plan.

## 2018-03-30 NOTE — Telephone Encounter (Signed)
New Message    Patient has had diarrhea for a few days now and today has been worse than when it first started and patient afraid he's not getting his proper dose of medication since he's having the issue and wants to know what he should do.

## 2018-03-31 NOTE — Progress Notes (Signed)
It is okay to discontinue the LifeVest.

## 2018-04-02 ENCOUNTER — Ambulatory Visit: Payer: BLUE CROSS/BLUE SHIELD | Admitting: Sports Medicine

## 2018-04-02 ENCOUNTER — Telehealth (HOSPITAL_COMMUNITY): Payer: Self-pay

## 2018-04-02 ENCOUNTER — Encounter (HOSPITAL_COMMUNITY): Payer: Self-pay

## 2018-04-02 NOTE — Telephone Encounter (Signed)
Pt called and stated he will like to attend CR here at [facility]. Patient will come in for orientation on 05/11/18 @ 815AM and will attend the 945AM exercise class.  Mailed homework package.

## 2018-04-02 NOTE — Telephone Encounter (Signed)
Pt insurance is active and benefits verified through [employer]. Co-pay $0.00, DED $3,000.00/$0.00 met, out of pocket $8,150.00/$0.00 met, co-insurance 20%. No pre-authorization required. Passport, 04/02/18 @ 3:14PM, REF# [id number]

## 2018-04-05 ENCOUNTER — Telehealth: Payer: Self-pay | Admitting: *Deleted

## 2018-04-05 MED ORDER — DIPHENOXYLATE-ATROPINE 2.5-0.025 MG PO TABS: ORAL_TABLET | ORAL | 1 refills | Status: DC

## 2018-04-05 NOTE — Telephone Encounter (Signed)
Patient's wife came for appointment today and patient requested refill on lomotil. Per Dr [name], okay for refill. Rx created and faxed to pharmacy.

## 2018-04-06 ENCOUNTER — Ambulatory Visit: Payer: BLUE CROSS/BLUE SHIELD | Admitting: Nurse Practitioner

## 2018-04-07 ENCOUNTER — Telehealth: Payer: Self-pay | Admitting: Pharmacist

## 2018-04-07 ENCOUNTER — Ambulatory Visit (INDEPENDENT_AMBULATORY_CARE_PROVIDER_SITE_OTHER): Payer: BLUE CROSS/BLUE SHIELD | Admitting: *Deleted

## 2018-04-07 ENCOUNTER — Telehealth: Payer: Self-pay | Admitting: *Deleted

## 2018-04-07 ENCOUNTER — Other Ambulatory Visit: Payer: BLUE CROSS/BLUE SHIELD | Admitting: *Deleted

## 2018-04-07 ENCOUNTER — Other Ambulatory Visit: Payer: Self-pay

## 2018-04-07 ENCOUNTER — Inpatient Hospital Stay (HOSPITAL_COMMUNITY)
Admission: EM | Admit: 2018-04-07 | Discharge: 2018-04-14 | DRG: 394 | Disposition: A | Discharge status: Home / Self Care | Payer: BLUE CROSS/BLUE SHIELD | Attending: Internal Medicine | Admitting: Internal Medicine

## 2018-04-07 ENCOUNTER — Encounter (HOSPITAL_COMMUNITY): Payer: Self-pay | Admitting: Emergency Medicine

## 2018-04-07 DIAGNOSIS — K922 Gastrointestinal hemorrhage, unspecified: Secondary | ICD-10-CM

## 2018-04-07 DIAGNOSIS — J82 Pulmonary eosinophilia, not elsewhere classified: Secondary | ICD-10-CM | POA: Diagnosis not present

## 2018-04-07 DIAGNOSIS — I10 Essential (primary) hypertension: Secondary | ICD-10-CM | POA: Diagnosis present

## 2018-04-07 DIAGNOSIS — Z86010 Personal history of colonic polyps: Secondary | ICD-10-CM

## 2018-04-07 DIAGNOSIS — K5090 Crohn's disease, unspecified, without complications: Secondary | ICD-10-CM | POA: Diagnosis not present

## 2018-04-07 DIAGNOSIS — Z8679 Personal history of other diseases of the circulatory system: Secondary | ICD-10-CM

## 2018-04-07 DIAGNOSIS — Z7983 Long term (current) use of bisphosphonates: Secondary | ICD-10-CM

## 2018-04-07 DIAGNOSIS — I255 Ischemic cardiomyopathy: Secondary | ICD-10-CM | POA: Diagnosis present

## 2018-04-07 DIAGNOSIS — K621 Rectal polyp: Principal | ICD-10-CM | POA: Diagnosis present

## 2018-04-07 DIAGNOSIS — T45515D Adverse effect of anticoagulants, subsequent encounter: Secondary | ICD-10-CM

## 2018-04-07 DIAGNOSIS — M810 Age-related osteoporosis without current pathological fracture: Secondary | ICD-10-CM | POA: Diagnosis present

## 2018-04-07 DIAGNOSIS — K5520 Angiodysplasia of colon without hemorrhage: Secondary | ICD-10-CM | POA: Diagnosis not present

## 2018-04-07 DIAGNOSIS — Z8349 Family history of other endocrine, nutritional and metabolic diseases: Secondary | ICD-10-CM

## 2018-04-07 DIAGNOSIS — K5900 Constipation, unspecified: Secondary | ICD-10-CM | POA: Diagnosis not present

## 2018-04-07 DIAGNOSIS — Z955 Presence of coronary angioplasty implant and graft: Secondary | ICD-10-CM

## 2018-04-07 DIAGNOSIS — K50811 Crohn's disease of both small and large intestine with rectal bleeding: Secondary | ICD-10-CM | POA: Diagnosis not present

## 2018-04-07 DIAGNOSIS — N401 Enlarged prostate with lower urinary tract symptoms: Secondary | ICD-10-CM | POA: Diagnosis not present

## 2018-04-07 DIAGNOSIS — Z8249 Family history of ischemic heart disease and other diseases of the circulatory system: Secondary | ICD-10-CM

## 2018-04-07 DIAGNOSIS — I130 Hypertensive heart and chronic kidney disease with heart failure and stage 1 through stage 4 chronic kidney disease, or unspecified chronic kidney disease: Secondary | ICD-10-CM | POA: Diagnosis not present

## 2018-04-07 DIAGNOSIS — K5080 Crohn's disease of both small and large intestine without complications: Secondary | ICD-10-CM | POA: Diagnosis present

## 2018-04-07 DIAGNOSIS — Z7901 Long term (current) use of anticoagulants: Secondary | ICD-10-CM

## 2018-04-07 DIAGNOSIS — R350 Frequency of micturition: Secondary | ICD-10-CM | POA: Diagnosis not present

## 2018-04-07 DIAGNOSIS — N400 Enlarged prostate without lower urinary tract symptoms: Secondary | ICD-10-CM | POA: Diagnosis present

## 2018-04-07 DIAGNOSIS — I513 Intracardiac thrombosis, not elsewhere classified: Secondary | ICD-10-CM | POA: Diagnosis present

## 2018-04-07 DIAGNOSIS — N183 Chronic kidney disease, stage 3 (moderate): Secondary | ICD-10-CM | POA: Diagnosis present

## 2018-04-07 DIAGNOSIS — I241 Dressler's syndrome: Secondary | ICD-10-CM | POA: Diagnosis not present

## 2018-04-07 DIAGNOSIS — D62 Acute posthemorrhagic anemia: Secondary | ICD-10-CM | POA: Diagnosis not present

## 2018-04-07 DIAGNOSIS — I5022 Chronic systolic (congestive) heart failure: Secondary | ICD-10-CM | POA: Diagnosis present

## 2018-04-07 DIAGNOSIS — Z9049 Acquired absence of other specified parts of digestive tract: Secondary | ICD-10-CM

## 2018-04-07 DIAGNOSIS — Z7902 Long term (current) use of antithrombotics/antiplatelets: Secondary | ICD-10-CM

## 2018-04-07 DIAGNOSIS — D6832 Hemorrhagic disorder due to extrinsic circulating anticoagulants: Secondary | ICD-10-CM | POA: Diagnosis not present

## 2018-04-07 DIAGNOSIS — I700 Atherosclerosis of aorta: Secondary | ICD-10-CM | POA: Diagnosis present

## 2018-04-07 DIAGNOSIS — Z86018 Personal history of other benign neoplasm: Secondary | ICD-10-CM

## 2018-04-07 DIAGNOSIS — Z800 Family history of malignant neoplasm of digestive organs: Secondary | ICD-10-CM

## 2018-04-07 DIAGNOSIS — D123 Benign neoplasm of transverse colon: Secondary | ICD-10-CM | POA: Diagnosis not present

## 2018-04-07 DIAGNOSIS — K50919 Crohn's disease, unspecified, with unspecified complications: Secondary | ICD-10-CM

## 2018-04-07 DIAGNOSIS — K219 Gastro-esophageal reflux disease without esophagitis: Secondary | ICD-10-CM | POA: Diagnosis present

## 2018-04-07 DIAGNOSIS — R197 Diarrhea, unspecified: Secondary | ICD-10-CM | POA: Diagnosis not present

## 2018-04-07 DIAGNOSIS — Z87891 Personal history of nicotine dependence: Secondary | ICD-10-CM

## 2018-04-07 DIAGNOSIS — K51411 Inflammatory polyps of colon with rectal bleeding: Secondary | ICD-10-CM | POA: Diagnosis not present

## 2018-04-07 DIAGNOSIS — I236 Thrombosis of atrium, auricular appendage, and ventricle as current complications following acute myocardial infarction: Secondary | ICD-10-CM | POA: Diagnosis not present

## 2018-04-07 DIAGNOSIS — N179 Acute kidney failure, unspecified: Secondary | ICD-10-CM | POA: Diagnosis not present

## 2018-04-07 DIAGNOSIS — K6289 Other specified diseases of anus and rectum: Secondary | ICD-10-CM | POA: Diagnosis not present

## 2018-04-07 DIAGNOSIS — E785 Hyperlipidemia, unspecified: Secondary | ICD-10-CM | POA: Diagnosis present

## 2018-04-07 DIAGNOSIS — Z79899 Other long term (current) drug therapy: Secondary | ICD-10-CM

## 2018-04-07 DIAGNOSIS — I252 Old myocardial infarction: Secondary | ICD-10-CM

## 2018-04-07 DIAGNOSIS — K200 Eosinophilic esophagitis: Secondary | ICD-10-CM | POA: Diagnosis present

## 2018-04-07 DIAGNOSIS — K921 Melena: Secondary | ICD-10-CM | POA: Diagnosis present

## 2018-04-07 DIAGNOSIS — G8929 Other chronic pain: Secondary | ICD-10-CM | POA: Diagnosis present

## 2018-04-07 DIAGNOSIS — Z9682 Presence of neurostimulator: Secondary | ICD-10-CM

## 2018-04-07 DIAGNOSIS — Z881 Allergy status to other antibiotic agents status: Secondary | ICD-10-CM

## 2018-04-07 DIAGNOSIS — D124 Benign neoplasm of descending colon: Secondary | ICD-10-CM | POA: Diagnosis not present

## 2018-04-07 DIAGNOSIS — Z7952 Long term (current) use of systemic steroids: Secondary | ICD-10-CM

## 2018-04-07 DIAGNOSIS — K5140 Inflammatory polyps of colon without complications: Secondary | ICD-10-CM | POA: Diagnosis not present

## 2018-04-07 DIAGNOSIS — I2510 Atherosclerotic heart disease of native coronary artery without angina pectoris: Secondary | ICD-10-CM | POA: Diagnosis present

## 2018-04-07 DIAGNOSIS — Z888 Allergy status to other drugs, medicaments and biological substances status: Secondary | ICD-10-CM

## 2018-04-07 DIAGNOSIS — Z8379 Family history of other diseases of the digestive system: Secondary | ICD-10-CM

## 2018-04-07 LAB — COMPREHENSIVE METABOLIC PANEL
ALT: 32 U/L (ref 0–44)
AST: 25 U/L (ref 15–41)
Albumin: 3.8 g/dL (ref 3.5–5.0)
Alkaline Phosphatase: 39 U/L (ref 38–126)
Anion gap: 8 (ref 5–15)
BUN: 29 mg/dL — ABNORMAL HIGH (ref 6–20)
CO2: 21 mmol/L — ABNORMAL LOW (ref 22–32)
Calcium: 9 mg/dL (ref 8.9–10.3)
Chloride: 106 mmol/L (ref 98–111)
Creatinine, Ser: 1.27 mg/dL — ABNORMAL HIGH (ref 0.61–1.24)
GFR calc non Af Amer: >60 mL/min (ref >60)
Glucose, Bld: 115 mg/dL — ABNORMAL HIGH (ref 70–99)
Potassium: 4.7 mmol/L (ref 3.5–5.1)
Sodium: 135 mmol/L (ref 135–145)
Total Bilirubin: 1 mg/dL (ref 0.3–1.2)
Total Protein: 6.5 g/dL (ref 6.5–8.1)

## 2018-04-07 LAB — CBC
HCT: 45.3 % (ref 39.0–52.0)
HCT: 40.5 % (ref 39.0–52.0)
Hemoglobin: 13.7 g/dL (ref 13.0–17.0)
Hemoglobin: 12.5 g/dL — ABNORMAL LOW (ref 13.0–17.0)
MCH: 28 pg (ref 26.0–34.0)
MCH: 28 pg (ref 26.0–34.0)
MCHC: 30.2 g/dL (ref 30.0–36.0)
MCHC: 30.9 g/dL (ref 30.0–36.0)
MCV: 92.6 fL (ref 80.0–100.0)
MCV: 90.8 fL (ref 80.0–100.0)
PLATELETS: 262 10*3/uL (ref 150–400)
Platelets: 291 10*3/uL (ref 150–400)
RBC: 4.89 MIL/uL (ref 4.22–5.81)
RBC: 4.46 MIL/uL (ref 4.22–5.81)
RDW: 13.4 % (ref 11.5–15.5)
RDW: 13.3 % (ref 11.5–15.5)
WBC: 13.3 10*3/uL — ABNORMAL HIGH (ref 4.0–10.5)
WBC: 12.4 10*3/uL — ABNORMAL HIGH (ref 4.0–10.5)
nRBC: 0 % (ref 0.0–0.2)
nRBC: 0 % (ref 0.0–0.2)

## 2018-04-07 LAB — PROTIME-INR
INR: 2.2
Prothrombin Time: 24.2 seconds — ABNORMAL HIGH (ref 11.4–15.2)

## 2018-04-07 LAB — ABO/RH: ABO/RH(D): O POS

## 2018-04-07 LAB — TYPE AND SCREEN
ABO/RH(D): O POS
Antibody Screen: NEGATIVE

## 2018-04-07 LAB — POCT INR: INR: 2.4 (ref 2.0–3.0)

## 2018-04-07 LAB — POC OCCULT BLOOD, ED: Fecal Occult Bld: POSITIVE — AB

## 2018-04-07 MED ORDER — SODIUM CHLORIDE 0.9 % IV BOLUS
1000.0000 mL | Freq: Once | INTRAVENOUS | Status: AC
  Administered 2018-04-07: 1000 mL via INTRAVENOUS

## 2018-04-07 MED ORDER — ACETAMINOPHEN 325 MG PO TABS: 650.0000 mg | ORAL_TABLET | Freq: Four times a day (QID) | ORAL | Status: DC | PRN

## 2018-04-07 MED ORDER — COLCHICINE 0.6 MG PO TABS
0.6000 mg | ORAL_TABLET | Freq: Every day | ORAL | Status: DC
  Administered 2018-04-08 – 2018-04-13 (×6): 0.6 mg via ORAL
  Filled 2018-04-07 (×6): qty 1

## 2018-04-07 MED ORDER — HYDROXYZINE HCL 25 MG PO TABS
100.0000 mg | ORAL_TABLET | Freq: Every day | ORAL | Status: DC
  Administered 2018-04-07 – 2018-04-13 (×7): 100 mg via ORAL
  Filled 2018-04-07 (×7): qty 4

## 2018-04-07 MED ORDER — ATORVASTATIN CALCIUM 80 MG PO TABS
80.0000 mg | ORAL_TABLET | Freq: Every day | ORAL | Status: DC
  Administered 2018-04-08 – 2018-04-14 (×7): 80 mg via ORAL
  Filled 2018-04-07 (×7): qty 1

## 2018-04-07 MED ORDER — MELATONIN 3 MG PO TABS
6.0000 mg | ORAL_TABLET | Freq: Every evening | ORAL | Status: DC | PRN
  Administered 2018-04-07 – 2018-04-13 (×6): 6 mg via ORAL
  Filled 2018-04-07 (×7): qty 2

## 2018-04-07 MED ORDER — GABAPENTIN 300 MG PO CAPS
300.0000 mg | ORAL_CAPSULE | Freq: Every morning | ORAL | Status: DC
  Administered 2018-04-08 – 2018-04-13 (×6): 300 mg via ORAL
  Filled 2018-04-07 (×7): qty 1

## 2018-04-07 MED ORDER — GABAPENTIN 300 MG PO CAPS
600.0000 mg | ORAL_CAPSULE | Freq: Every evening | ORAL | Status: DC
  Administered 2018-04-07 – 2018-04-14 (×8): 600 mg via ORAL
  Filled 2018-04-07 (×9): qty 2

## 2018-04-07 MED ORDER — TAMSULOSIN HCL 0.4 MG PO CAPS
0.4000 mg | ORAL_CAPSULE | Freq: Every day | ORAL | Status: DC
  Administered 2018-04-08 – 2018-04-13 (×6): 0.4 mg via ORAL
  Filled 2018-04-07 (×6): qty 1

## 2018-04-07 MED ORDER — CARVEDILOL 3.125 MG PO TABS
3.1250 mg | ORAL_TABLET | Freq: Two times a day (BID) | ORAL | Status: DC
  Administered 2018-04-08 – 2018-04-14 (×14): 3.125 mg via ORAL
  Filled 2018-04-07 (×15): qty 1

## 2018-04-07 MED ORDER — GABAPENTIN 300 MG PO CAPS: 300.0000 mg | ORAL_CAPSULE | ORAL | Status: DC

## 2018-04-07 MED ORDER — ONDANSETRON HCL 4 MG PO TABS: 4.0000 mg | ORAL_TABLET | Freq: Four times a day (QID) | ORAL | Status: DC | PRN

## 2018-04-07 MED ORDER — PANTOPRAZOLE SODIUM 40 MG PO TBEC
40.0000 mg | DELAYED_RELEASE_TABLET | Freq: Two times a day (BID) | ORAL | Status: DC
  Administered 2018-04-07 – 2018-04-08 (×2): 40 mg via ORAL
  Filled 2018-04-07 (×2): qty 1

## 2018-04-07 MED ORDER — ONDANSETRON HCL 4 MG/2ML IJ SOLN
4.0000 mg | Freq: Four times a day (QID) | INTRAMUSCULAR | Status: DC | PRN
  Administered 2018-04-09: 4 mg via INTRAVENOUS
  Filled 2018-04-07: qty 2

## 2018-04-07 MED ORDER — PREDNISONE 5 MG PO TABS
17.0000 mg | ORAL_TABLET | Freq: Every day | ORAL | Status: DC
  Administered 2018-04-08 – 2018-04-14 (×7): 17 mg via ORAL
  Filled 2018-04-07 (×8): qty 2

## 2018-04-07 MED ORDER — ACETAMINOPHEN 650 MG RE SUPP: 650.0000 mg | Freq: Four times a day (QID) | RECTAL | Status: DC | PRN

## 2018-04-07 NOTE — Telephone Encounter (Signed)
Okay to order CBC. DOD is in agreement.

## 2018-04-07 NOTE — H&P (Addendum)
History and Physical    Jack [name] DOB: [birth year] DOA: 04/07/2018  PCP: [name], MD  Patient coming from: Home  Chief Complaint: Hematochezia  HPI: Jack [name] is a 53 y.o. male with medical history significant of STEMI s/p LAD PCI, ischemic cardiomyopathy, pericarditis, crohn disease.  Patient reports about 6-7 bright red bloody stools this morning.  They have been consistent.  He consider taking Imodium but did not.  He has not take anything else help with his symptoms.  He has no associated abdominal pain.  He reports a recent history of few weeks ago requiring hospitalization, however, symptoms thought to be secondary to a supratherapeutic INR.  ED Course: Vitals: Afebrile, normal pulse, normal respirations, normotensive, on room air Labs: Hemoglobin of 13.7, BUN of 29, creatinine of 1.27, creatinine of 2.2 Imaging: None Medications/Course: 1 L normal saline bolus  Review of Systems: Review of Systems  Constitutional: Negative for chills and fever.  Respiratory: Negative for cough, shortness of breath and wheezing.   Cardiovascular: Negative for chest pain.  Gastrointestinal: Positive for blood in stool and diarrhea. Negative for abdominal pain, constipation, melena, nausea and vomiting.  All other systems reviewed and are negative.   Past Medical History:  Diagnosis Date  . BPH (benign prostatic hypertrophy)   . CAD (coronary artery disease)    a. anterior STEMI 01/2018 -  proximal occlusion of LAD, treated with DES. Cath also showed 20% distal LM, 95% ostial-prox small-moderate ramus, 70-80% ostial Cx, 70% dominant ostial OM, 50-60% prox Cx, 70% RCA. EF 35% by cath with LVEDP 21mHg. Med rx for residual disease. Course complicated by post MI pericarditis and LV thrombus.  . Chronic systolic (congestive) heart failure ([facility]   . Colitis   . Colon polyp    inflammatory  . Crohn disease ([facility] 1992   history uveitis, involvement of  intestines and lungs  . Diverticulosis   . Eosinophilic granuloma ([facility]   . Essential hypertension   . GERD (gastroesophageal reflux disease)   . History of chicken pox   . History of gastroesophageal reflux (GERD)   . History of seizure 1995   grand mal x1, completed 6 yrs dilantin. no seizures since  . Hyperlipidemia   . Internal hemorrhoids   . Ischemic cardiomyopathy   . LV (left ventricular) mural thrombus following MI ([facility] 01/2018  . Osteoporosis 11/2015   DEXA T -2.9  . Schwannoma 2007   L axilla s/p surgery  . Seizures ([facility]    last seizure 1995 and only x 1 seizure-   . Ulnar neuropathy    h/o this from L arm schwannoma    Past Surgical History:  Procedure Laterality Date  . APPENDECTOMY  2000  . CARDIOVASCULAR STRESS TEST  01/2015   low risk study  . CHOLECYSTECTOMY  2000  . COLONOSCOPY  08/2014   f/u crohn's, 4 inflammatory polyps, diverticulosis, improved, rpt 2 yrs ([place])  . CORONARY ANGIOGRAPHY N/A 02/21/2018   Procedure: CORONARY ANGIOGRAPHY;  Surgeon: SBelva Crome MD;  [facility];  Service: Cardiovascular;  Laterality: N/A;  . CORONARY/GRAFT ACUTE MI REVASCULARIZATION N/A 02/20/2018   Procedure: Coronary/Graft Acute MI Revascularization;  Surgeon: SBelva Crome MD;  [facility];  Service: Cardiovascular;  Laterality: N/A;  . ESOPHAGOGASTRODUODENOSCOPY  07/2014   h/o EE resolved, focal reflux esophagitis, chronic active gastritis  . extremity surgery Left    ulnar nerve repair after schwannoma removal  . INGUINAL HERNIA REPAIR Bilateral  2017  . LEFT HEART CATH AND CORONARY ANGIOGRAPHY N/A 02/20/2018   Procedure: LEFT HEART CATH AND CORONARY ANGIOGRAPHY;  Surgeon: Belva Crome, MD;  Location: [facility];  Service: Cardiovascular;  Laterality: N/A;  . LUMBAR SPINE SURGERY  2011  . SPINAL CORD STIMULATOR IMPLANT  2012   x2 (Dr Berton Mount)  . TONSILLECTOMY  1996  . TUMOR REMOVAL Left 2007   schwannoma from L  armpit  . UPPER GASTROINTESTINAL ENDOSCOPY       reports that he quit smoking about 6 weeks ago. His smoking use included cigars. He has never used smokeless tobacco. He reports current alcohol use of about 7.0 standard drinks of alcohol per week. He reports that he does not use drugs.  Allergies  Allergen Reactions  . Cephalexin Nausea And Vomiting    Other Reaction: GI UPSET  . Duloxetine Other (See Comments)    Headaches     Family History  Problem Relation Age of Onset  . CAD Mother 65       CABG  . Congenital heart disease Mother   . Hypertension Mother   . Hyperlipidemia Mother   . CAD Father 29       CABG  . Hypertension Father   . Hyperlipidemia Father   . Cancer Maternal Grandmother        colon  . Colon cancer Maternal Grandfather   . Diabetes Neg Hx   . Colon polyps Neg Hx   . Esophageal cancer Neg Hx   . Rectal cancer Neg Hx   . Stomach cancer Neg Hx     Prior to Admission medications   Medication Sig Start Date End Date Taking? Authorizing Provider  acetaminophen (TYLENOL) 325 MG tablet Take 650 mg by mouth every 6 (six) hours as needed for mild pain or headache.    Yes [provider]  alendronate (FOSAMAX) 70 MG tablet Take 70 mg by mouth every Saturday.  01/11/18  Yes [provider]  atorvastatin (LIPITOR) 80 MG tablet Take 1 tablet (80 mg total) by mouth daily at 6 PM. 02/24/18  Yes Sande Rives E, PA-C  Calcium Carbonate-Vit D-Min (CALCIUM 1200 PO) Take 1 capsule by mouth 2 (two) times daily.   Yes [provider]  carvedilol (COREG) 3.125 MG tablet Take 1 tablet (3.125 mg total) by mouth 2 (two) times daily with a meal. 02/24/18  Yes Sande Rives E, PA-C  clopidogrel (PLAVIX) 75 MG tablet Take 1 tablet (75 mg total) by mouth daily. 02/24/18  Yes Sande Rives E, PA-C  colchicine 0.6 MG tablet Take 1 tablet (0.6 mg total) by mouth daily. 03/04/18  Yes Dunn, Nedra Hai, PA-C  diphenoxylate-atropine (LOMOTIL) 2.5-0.025 MG  tablet Take 1-2 tablets by mouth 4 times daily as needed for diarrhea 04/05/18  Yes Pyrtle, Lajuan Lines, MD  furosemide (LASIX) 20 MG tablet Take only as needed 03/19/18  Yes Dunn, Dayna N, PA-C  gabapentin (NEURONTIN) 300 MG capsule Take one capsule TID Patient taking differently: Take 300-600 mg by mouth See admin instructions. Take 1 capsule by mouth in the morning and 2 capsule at 6pm 03/25/18  Yes Draper, Christia Reading R, DO  hydrOXYzine (ATARAX/VISTARIL) 25 MG tablet TAKE 4 TABLETS (100 MG TOTAL) BY MOUTH AT BEDTIME. 01/26/18  Yes [name], MD  losartan (COZAAR) 25 MG tablet Take 1 tablet (25 mg total) by mouth daily. 02/25/18  Yes Sande Rives E, PA-C  Multiple Vitamin (MULTIVITAMIN) capsule Take 1 capsule by mouth daily.  Yes [provider]  nitroGLYCERIN (NITROSTAT) 0.4 MG SL tablet Place 1 tablet (0.4 mg total) under the tongue every 5 (five) minutes as needed for chest pain. 02/24/18 02/24/19 Yes Sande Rives E, PA-C  pantoprazole (PROTONIX) 40 MG tablet Take 1 tablet (40 mg total) by mouth 2 (two) times daily. 03/12/18 03/12/19 Yes [name], MD  predniSONE (DELTASONE) 1 MG tablet Take 2 mg by mouth See admin instructions. Take 2 mg by mouth along with 48m and 5 mg tablet to equal 158mtotal.   Yes GuRia BushMD  predniSONE (DELTASONE) 10 MG tablet Take 10 mg by mouth See admin instructions. Take 10 by mouth along with 47m60mnd 5 mg tablet to equal 74m43mtal.   Yes GutiRia Bush  predniSONE (DELTASONE) 5 MG tablet Take 5 mg by mouth See admin instructions. Take 5 mg by mouth along with 47mg 74m 10mg 7met to equal 74mg t7m.   Yes [provider]  spironolactone (ALDACTONE) 25 MG tablet Take 0.5 tablets (12.5 mg total) by mouth daily. 03/19/18 06/17/18 Yes Dunn, Dayna N, PA-C  tamsulosin (FLOMAX) 0.4 MG CAPS capsule TAKE 1 CAPSULE (0.4 MG TOTAL) BY MOUTH DAILY. 04/29/17  Yes GutierrRia Bushedolizumab (ENTYVIO) 300 MG injection Inject  300 mg into the vein every 8 (eight) weeks.    Yes [provider]  warfarin (COUMADIN) 5 MG tablet Take as directed by Coumadin clinic Patient taking differently: Take 2.5-5 mg by mouth See admin instructions. Take 5 mg by mouth daily except 2.5 mg Tuesday and Thursday and saturday 03/19/18  Yes Smith, Belva Crome Physical Exam:  Physical Exam Vitals signs and nursing note reviewed.  Constitutional:      General: He is not in acute distress.    Appearance: Normal appearance. He is well-developed. He is not diaphoretic.  HENT:     Mouth/Throat:     Mouth: Mucous membranes are moist.  Eyes:     Conjunctiva/sclera: Conjunctivae normal.     Pupils: Pupils are equal, round, and reactive to light.  Neck:     Musculoskeletal: Normal range of motion.  Cardiovascular:     Rate and Rhythm: Normal rate and regular rhythm.     Heart sounds: Normal heart sounds. No murmur.  Pulmonary:     Effort: Pulmonary effort is normal. No respiratory distress.     Breath sounds: Normal breath sounds. No wheezing or rales.  Abdominal:     General: Bowel sounds are normal. There is no distension.     Palpations: Abdomen is soft.     Tenderness: There is no abdominal tenderness. There is no guarding or rebound.  Musculoskeletal: Normal range of motion.        General: No tenderness.  Lymphadenopathy:     Cervical: No cervical adenopathy.  Skin:    General: Skin is warm and dry.  Neurological:     Mental Status: He is alert and oriented to person, place, and time.  Psychiatric:        Mood and Affect: Mood normal.        Thought Content: Thought content normal.        Judgment: Judgment normal.      Labs on Admission: I have personally reviewed following labs and imaging studies  CBC: Recent Labs  Lab 04/07/18 1533  WBC 13.3*  HGB 13.7  HCT 45.3  MCV 92.6  PLT 291    671ic Metabolic Panel: Recent Labs  Lab  04/07/18 1533  NA 135  K 4.7  CL 106  CO2 21*  GLUCOSE 115*    BUN 29*  CREATININE 1.27*  CALCIUM 9.0    Liver Function Tests: Recent Labs  Lab 04/07/18 1533  AST 25  ALT 32  ALKPHOS 39  BILITOT 1.0  PROT 6.5  ALBUMIN 3.8   Coagulation Profile: Recent Labs  Lab 04/07/18 1158 04/07/18 1533  INR 2.4 2.20   BNP (last 3 results) Recent Labs    03/18/18 1524 03/22/18 1140  PROBNP 2,934* 2,573*    Assessment/Plan Principal Problem:   Hematochezia Active Problems:   Essential hypertension   Benign prostatic hyperplasia   LV (left ventricular) mural thrombus following MI (HCC)   Hyperlipidemia   Ischemic cardiomyopathy   Hematochezia In setting of coumadin use in addition to history of Crohn disease. Patient follows with [facility] as an outpatient -[facility] recommendations: pending -CBC q6 hours -Clear liquid diet  LV thrombus In setting of recent STEMI. Currently on Coumadin as an outpatient. GI recommending to hold Coumadin. INR is currently 2.2. Cardiology consulted.  Recent STEMI Patient with a recent 100% LAD stenosis s/p PCI with recommendations for DAPT for 12 months. Discussed with cardiology and will follow. Patient was on Coumadin/Plavix prior to this admission and previously on Coumadin/aspirin/Plavix prior to previous hospital admission -Cardiology recommendations pending  Ischemic cardiomyopathy Secondary to STEMI. Current EF of 35-40% from Transthoracic Echocardiogram on 12/27. Currently on lasix as needed and spironolactone. Currently euvolemic. -Watch fluid status/weights -Holding diuretics secondary to bleeding. Low threshold to restart especially if receiving blood.  Essential hypertension Patient is on Coreg, losartan and spironolactone as an outpatient. -Hold Coreg, losartan, spironolactone in setting of hematochezia  BPH -Continue Flomax  Pericarditis -Continue colchicine  CKD stage III Stable.  Hyperlipidemia -Continue Lipitor  GERD -Continue Protonix   DVT prophylaxis: SCDs Code  Status: Full code Family Communication: None at bedside Disposition Plan: Discharge pending GI workup/management Consults called:  GI Admission status: Observation   Cordelia Poche, MD [facility] 04/07/2018, 5:39 PM  If 7PM-7AM, please contact night-coverage [email or web address] Password [email or web address]

## 2018-04-07 NOTE — ED Provider Notes (Addendum)
[facility] EMERGENCY DEPARTMENT Provider Note   CSN: [id number] Arrival date & time: 04/07/18  1456     History   Chief Complaint Chief Complaint  Patient presents with  . Abnormal Lab  . GI Bleeding    HPI Jack [name] is a 53 y.o. male hx of CAD, CHF, Crohn's disease, HTN, recent GI bleed from coumadin use, here presenting with bright red blood per rectum.  Patient was admitted to the hospital for rectal bleeding.  Patient was on Coumadin after having LV thrombus after a STEMI.  Patient was admitted but a month ago and Coumadin was held and no scopes were done.  States that he has daily diarrhea for the last month or so.  Today, he noticed 6 episodes of bright red blood per rectum.  He states that he feels diffusely weak but denies any chest pain or shortness of breath.  He is still taking his Coumadin and INR was 2.4 earlier today.  He called his GI doctor, Dr. [name], who recommend that he comes to the ED for admission.   The history is provided by the patient.    Past Medical History:  Diagnosis Date  . BPH (benign prostatic hypertrophy)   . CAD (coronary artery disease)    a. anterior STEMI 01/2018 -  proximal occlusion of LAD, treated with DES. Cath also showed 20% distal LM, 95% ostial-prox small-moderate ramus, 70-80% ostial Cx, 70% dominant ostial OM, 50-60% prox Cx, 70% RCA. EF 35% by cath with LVEDP 64mHg. Med rx for residual disease. Course complicated by post MI pericarditis and LV thrombus.  . Chronic systolic (congestive) heart failure ([place]   . Colitis   . Colon polyp    inflammatory  . Crohn disease ([place] 1992   history uveitis, involvement of intestines and lungs  . Diverticulosis   . Eosinophilic granuloma ([place]   . Essential hypertension   . GERD (gastroesophageal reflux disease)   . History of chicken pox   . History of gastroesophageal reflux (GERD)   . History of seizure 1995   grand mal x1, completed 6 yrs dilantin. no  seizures since  . Hyperlipidemia   . Internal hemorrhoids   . Ischemic cardiomyopathy   . LV (left ventricular) mural thrombus following MI ([place] 01/2018  . Osteoporosis 11/2015   DEXA T -2.9  . Schwannoma 2007   L axilla s/p surgery  . Seizures ([place]    last seizure 1995 and only x 1 seizure-   . Ulnar neuropathy    h/o this from L arm schwannoma    Patient Active Problem List   Diagnosis Date Noted  . Leukocytosis 03/23/2018  . Chronic leg pain 03/23/2018  . Status post insertion of drug-eluting stent into left anterior descending (LAD) artery for coronary artery disease 03/13/2018  . Ischemic cardiomyopathy 03/13/2018  . GIB (gastrointestinal bleeding) 03/06/2018  . Encounter for therapeutic drug monitoring 03/01/2018  . LV (left ventricular) mural thrombus following MI ([place] 02/24/2018  . Warfarin anticoagulation 02/24/2018  . Post-MI pericarditis ([place] 02/24/2018  . Hyperlipidemia 02/24/2018  . Acute ST elevation myocardial infarction (STEMI) involving left anterior descending (LAD) coronary artery ([place] 02/20/2018  . Elevated PSA measurement 03/21/2017  . Churg-Strauss syndrome with lung involvement ([facility] 01/08/2017  . Defecation urgency 08/04/2016  . Healthcare maintenance 03/18/2016  . Eosinophilic esophagitis   . Hypereosinophilic syndrome 077/[id number] . Essential hypertension   . Benign prostatic hyperplasia   . Osteoporosis 11/30/2015  . Schwannoma 03/31/2005  .  Crohn disease ([place]) 03/31/1990    Past Surgical History:  Procedure Laterality Date  . APPENDECTOMY  2000  . CARDIOVASCULAR STRESS TEST  01/2015   low risk study  . CHOLECYSTECTOMY  2000  . COLONOSCOPY  08/2014   f/u crohn's, 4 inflammatory polyps, diverticulosis, improved, rpt 2 yrs ([place])  . COLONOSCOPY  01/2015   during hospitalization - active ileitis/colitis  . CORONARY ANGIOGRAPHY N/A 02/21/2018   Procedure: CORONARY ANGIOGRAPHY;  Surgeon: Belva Crome, MD;  Location: [facility] CV LAB;   Service: Cardiovascular;  Laterality: N/A;  . CORONARY/GRAFT ACUTE MI REVASCULARIZATION N/A 02/20/2018   Procedure: Coronary/Graft Acute MI Revascularization;  Surgeon: Belva Crome, MD;  Location: [facility] CV LAB;  Service: Cardiovascular;  Laterality: N/A;  . ESOPHAGOGASTRODUODENOSCOPY  07/2014   h/o EE resolved, focal reflux esophagitis, chronic active gastritis  . extremity surgery Left    ulnar nerve repair after schwannoma removal  . INGUINAL HERNIA REPAIR Bilateral 2017  . LEFT HEART CATH AND CORONARY ANGIOGRAPHY N/A 02/20/2018   Procedure: LEFT HEART CATH AND CORONARY ANGIOGRAPHY;  Surgeon: Belva Crome, MD;  Location: [facility] CV LAB;  Service: Cardiovascular;  Laterality: N/A;  . LUMBAR SPINE SURGERY  2011  . POLYPECTOMY    . SPINAL CORD STIMULATOR IMPLANT  2012   x2 (Dr Berton Mount)  . TONSILLECTOMY  1996  . TUMOR REMOVAL Left 2007   schwannoma from L armpit  . UPPER GASTROINTESTINAL ENDOSCOPY          Home Medications    Prior to Admission medications   Medication Sig Start Date End Date Taking? Authorizing Provider  acetaminophen (TYLENOL) 325 MG tablet Take 650 mg by mouth every 6 (six) hours as needed for mild pain or headache.    Yes [provider]  alendronate (FOSAMAX) 70 MG tablet Take 70 mg by mouth every Saturday.  01/11/18  Yes [provider]  atorvastatin (LIPITOR) 80 MG tablet Take 1 tablet (80 mg total) by mouth daily at 6 PM. 02/24/18  Yes Sande Rives E, PA-C  Calcium Carbonate-Vit D-Min (CALCIUM 1200 PO) Take 1 capsule by mouth 2 (two) times daily.   Yes [provider]  carvedilol (COREG) 3.125 MG tablet Take 1 tablet (3.125 mg total) by mouth 2 (two) times daily with a meal. 02/24/18  Yes Sande Rives E, PA-C  clopidogrel (PLAVIX) 75 MG tablet Take 1 tablet (75 mg total) by mouth daily. 02/24/18  Yes Sande Rives E, PA-C  colchicine 0.6 MG tablet Take 1 tablet (0.6 mg total) by mouth daily. 03/04/18  Yes  Dunn, Nedra Hai, PA-C  diphenoxylate-atropine (LOMOTIL) 2.5-0.025 MG tablet Take 1-2 tablets by mouth 4 times daily as needed for diarrhea 04/05/18  Yes Pyrtle, Lajuan Lines, MD  furosemide (LASIX) 20 MG tablet Take only as needed 03/19/18  Yes Dunn, Dayna N, PA-C  gabapentin (NEURONTIN) 300 MG capsule Take one capsule TID Patient taking differently: Take 300-600 mg by mouth See admin instructions. Take 1 capsule by mouth in the morning and 2 capsule at 6pm 03/25/18  Yes Draper, Christia Reading R, DO  hydrOXYzine (ATARAX/VISTARIL) 25 MG tablet TAKE 4 TABLETS (100 MG TOTAL) BY MOUTH AT BEDTIME. 01/26/18  Yes [name], MD  losartan (COZAAR) 25 MG tablet Take 1 tablet (25 mg total) by mouth daily. 02/25/18  Yes Sande Rives E, PA-C  Multiple Vitamin (MULTIVITAMIN) capsule Take 1 capsule by mouth daily.   Yes [provider]  nitroGLYCERIN (NITROSTAT) 0.4 MG SL tablet Place 1  tablet (0.4 mg total) under the tongue every 5 (five) minutes as needed for chest pain. 02/24/18 02/24/19 Yes Sande Rives E, PA-C  pantoprazole (PROTONIX) 40 MG tablet Take 1 tablet (40 mg total) by mouth 2 (two) times daily. 03/12/18 03/12/19 Yes [name], MD  predniSONE (DELTASONE) 1 MG tablet Take 2 mg by mouth See admin instructions. Take 2 mg by mouth along with 71m and 5 mg tablet to equal 15mtotal.   Yes GuRia BushMD  predniSONE (DELTASONE) 10 MG tablet Take 10 mg by mouth See admin instructions. Take 10 by mouth along with 37m51mnd 5 mg tablet to equal 5m69mtal.   Yes GutiRia Bush  predniSONE (DELTASONE) 5 MG tablet Take 5 mg by mouth See admin instructions. Take 5 mg by mouth along with 37mg 7m 10mg 77met to equal 5mg t58m.   Yes [provider]  spironolactone (ALDACTONE) 25 MG tablet Take 0.5 tablets (12.5 mg total) by mouth daily. 03/19/18 06/17/18 Yes Dunn, Dayna N, PA-C  tamsulosin (FLOMAX) 0.4 MG CAPS capsule TAKE 1 CAPSULE (0.4 MG TOTAL) BY MOUTH DAILY. 04/29/17  Yes  GutierrRia Bushedolizumab (ENTYVIO) 300 MG injection Inject 300 mg into the vein every 8 (eight) weeks.    Yes [provider]  warfarin (COUMADIN) 5 MG tablet Take as directed by Coumadin clinic Patient taking differently: Take 2.5-5 mg by mouth See admin instructions. Take 5 mg by mouth daily except 2.5 mg Tuesday and Thursday and saturday 03/19/18  Yes Smith, Belva Crome Family History Family History  Problem Relation Age of Onset  . CAD Mother 70     63CABG  . Congenital heart disease Mother   . Hypertension Mother   . Hyperlipidemia Mother   . CAD Father 70     35CABG  . Hypertension Father   . Hyperlipidemia Father   . Cancer Maternal Grandmother        colon  . Colon cancer Maternal Grandfather   . Diabetes Neg Hx   . Colon polyps Neg Hx   . Esophageal cancer Neg Hx   . Rectal cancer Neg Hx   . Stomach cancer Neg Hx     Social History Social History   Tobacco Use  . Smoking status: Former Smoker    Types: Cigars    Last attempt to quit: 02/20/2018    Years since quitting: 0.1  . Smokeless tobacco: Never Used  Substance Use Topics  . Alcohol use: Yes    Alcohol/week: 7.0 standard drinks    Types: 7 Cans of beer per week    Comment: 1 beer/day  . Drug use: No     Allergies   Cephalexin and Duloxetine   Review of Systems Review of Systems  Gastrointestinal: Positive for blood in stool and diarrhea.  All other systems reviewed and are negative.    Physical Exam Updated Vital Signs BP 124/81 (BP Location: Right Arm)   Pulse 79   Temp 97.7 F (36.5 C) (Oral)   Resp 16   SpO2 100%   Physical Exam Vitals signs and nursing note reviewed.  HENT:     Head: Normocephalic.     Nose: Nose normal.     Mouth/Throat:     Mouth: Mucous membranes are moist.  Eyes:     Extraocular Movements: Extraocular movements intact.     Pupils: Pupils are equal, round, and reactive to light.  Neck:  Musculoskeletal: Normal range of motion.    Cardiovascular:     Rate and Rhythm: Normal rate.     Pulses: Normal pulses.  Pulmonary:     Effort: Pulmonary effort is normal.     Breath sounds: Normal breath sounds.  Abdominal:     General: Abdomen is flat. Bowel sounds are normal.     Palpations: Abdomen is soft.  Genitourinary:    Comments: Rectal- bright red blood visualized, no obvious hemorrhoids  Musculoskeletal: Normal range of motion.  Skin:    General: Skin is warm.  Neurological:     General: No focal deficit present.     Mental Status: He is alert.  Psychiatric:        Mood and Affect: Mood normal.      ED Treatments / Results  Labs (all labs ordered are listed, but only abnormal results are displayed) Labs Reviewed  COMPREHENSIVE METABOLIC PANEL - Abnormal; Notable for the following components:      Result Value   CO2 21 (*)    Glucose, Bld 115 (*)    BUN 29 (*)    Creatinine, Ser 1.27 (*)    All other components within normal limits  CBC - Abnormal; Notable for the following components:   WBC 13.3 (*)    All other components within normal limits  PROTIME-INR - Abnormal; Notable for the following components:   Prothrombin Time 24.2 (*)    All other components within normal limits  POC OCCULT BLOOD, ED - Abnormal; Notable for the following components:   Fecal Occult Bld POSITIVE (*)    All other components within normal limits  POC OCCULT BLOOD, ED  TYPE AND SCREEN  ABO/RH    EKG None  Radiology No results found.  Procedures Procedures (including critical care time)  Medications Ordered in ED Medications  sodium chloride 0.9 % bolus 1,000 mL (0 mLs Intravenous Stopped 04/07/18 1616)     Initial Impression / Assessment and Plan / ED Course  I have reviewed the triage vital signs and the nursing notes.  Pertinent labs & imaging results that were available during my care of the patient were reviewed by me and considered in my medical decision making (see chart for details).    AJDIN MACKE is a 53 y.o. male here with BRBPR. He is on coumadin for LV thrombus. I think likely having diverticular bleeding. INR was 2.4 earlier today. Given that he had 6 episodes of bleeding, will check cbc and hydrate and likely need admission.   4:45 PM Hg 13. BUN, Cr stable. Patient is borderline orthostatic. Given IVF. [facility] to see patient as consult. Hospitalist to admit for lower GI bleed on coumadin.   5:41 PM I discussed case with Dr. Fuller Plan from [facility]. He recommend clears. Since patient is on coumadin, will hold off on colonoscopy for 1-2 days.    Final Clinical Impressions(s) / ED Diagnoses   Final diagnoses:  None    ED Discharge Orders    None       Drenda Freeze, MD 04/07/18 1646    Drenda Freeze, MD 04/07/18 [phone]

## 2018-04-07 NOTE — Patient Instructions (Signed)
Description   Continue taking 1 tablet daily except 1/2 tablet on Tuesdays, Thursdays and Saturdays.  Recheck INR in 2 weeks.  Coumadin Clinic [phone] Main [phone]

## 2018-04-07 NOTE — Telephone Encounter (Signed)
INR is 2.4- within goal. CBC ordered and pt sent to lab. Wife requested a call with results.

## 2018-04-07 NOTE — Telephone Encounter (Signed)
Patient called stating he is having rectal bleeding again. Wants to get INR checked today. He is coming to [place] to have his infusion for chrons medication this morning. States he is having diarreha and its pure blood. Patient is on warfarin for LV thrombus. He was admitted to [facility] about a month ago for the same. HGB at that time remained stable and no interventions were made. I called infusion center to make them aware of pt situation and I also encouraged patient to call his GI doctor. We will check his INR today- will forward this message to pt Cardiologist to see if they would like to do anything else-such as a CBC. Patient was advised that if he felt weak, dizzy, or faint to seek medical attention at the ED

## 2018-04-07 NOTE — Telephone Encounter (Signed)
Dr. Tamala Julian patient.  I agree with plan.

## 2018-04-07 NOTE — Progress Notes (Cosign Needed)
Contacted about pt for episodes of hematochezia.  On Coumadin, plavix Hx Crohns ileocolitis. On meds for this.  Newly started on Plavix.    Previous colonoscopy in [place].   Will plan for full consult tmrw  Azucena Freed.

## 2018-04-07 NOTE — Addendum Note (Signed)
Addended by: Derrel Nip B on: 04/07/2018 12:13 PM   Modules accepted: Orders

## 2018-04-07 NOTE — Telephone Encounter (Signed)
Patient's wife called in indicating that patient has been passing large amounts of bright red blood for several days and is very fatigued. He has had 6+ diarrheal bowel movements today, mostly although consisting of blood. Patient has history of ileocolonic crohns with complication necessitating Entyvio. He is also on anticoagulation for recent LAD MI.   I have spoken to Dr [name] who agrees that we cannot do anything for patient at our office if he is bleeding this profusely and he should go to the emergency room. I advised that he should go to [facility] due to his recent history of MI/cardiac issues. Patient's wife and patient verbalize understanding of this.

## 2018-04-07 NOTE — Addendum Note (Signed)
Addended by: Derrel Nip B on: 04/07/2018 12:10 PM   Modules accepted: Orders

## 2018-04-07 NOTE — ED Triage Notes (Signed)
Pt with weeks of diarrhea. Today has had 7 frank bloody stools, was sent here by Dr. Karlton Lemon office. Today INR at coumadin clinic was 2.4. Hx of MI 03/22/18. Pt is alert and oriented. Denies shortness or breath, n/v/ or fevers.

## 2018-04-08 ENCOUNTER — Encounter (HOSPITAL_COMMUNITY): Payer: Self-pay | Admitting: Physician Assistant

## 2018-04-08 DIAGNOSIS — K921 Melena: Secondary | ICD-10-CM | POA: Diagnosis not present

## 2018-04-08 DIAGNOSIS — K50811 Crohn's disease of both small and large intestine with rectal bleeding: Secondary | ICD-10-CM | POA: Diagnosis not present

## 2018-04-08 DIAGNOSIS — I236 Thrombosis of atrium, auricular appendage, and ventricle as current complications following acute myocardial infarction: Secondary | ICD-10-CM | POA: Diagnosis not present

## 2018-04-08 LAB — PROTIME-INR
INR: 1.97
Prothrombin Time: 22.1 seconds — ABNORMAL HIGH (ref 11.4–15.2)

## 2018-04-08 LAB — CBC
HCT: 40 % (ref 39.0–52.0)
Hematocrit: 39.5 % (ref 37.5–51.0)
Hemoglobin: 13 g/dL (ref 13.0–17.7)
Hemoglobin: 12.8 g/dL — ABNORMAL LOW (ref 13.0–17.0)
MCH: 29 pg (ref 26.6–33.0)
MCH: 29.3 pg (ref 26.0–34.0)
MCHC: 32.9 g/dL (ref 31.5–35.7)
MCHC: 32 g/dL (ref 30.0–36.0)
MCV: 88 fL (ref 79–97)
MCV: 91.5 fL (ref 80.0–100.0)
Platelets: 284 10*3/uL (ref 150–450)
Platelets: 272 10*3/uL (ref 150–400)
RBC: 4.49 x10E6/uL (ref 4.14–5.80)
RBC: 4.37 MIL/uL (ref 4.22–5.81)
RDW: 12.3 % (ref 11.6–15.4)
RDW: 13.4 % (ref 11.5–15.5)
WBC: 14.7 10*3/uL — ABNORMAL HIGH (ref 3.4–10.8)
WBC: 11.7 10*3/uL — ABNORMAL HIGH (ref 4.0–10.5)
nRBC: 0 % (ref 0.0–0.2)

## 2018-04-08 LAB — BASIC METABOLIC PANEL
Anion gap: 7 (ref 5–15)
BUN: 20 mg/dL (ref 6–20)
CO2: 27 mmol/L (ref 22–32)
Calcium: 8.3 mg/dL — ABNORMAL LOW (ref 8.9–10.3)
Chloride: 105 mmol/L (ref 98–111)
Creatinine, Ser: 1.31 mg/dL — ABNORMAL HIGH (ref 0.61–1.24)
GFR calc non Af Amer: >60 mL/min (ref >60)
Glucose, Bld: 89 mg/dL (ref 70–99)
Potassium: 4.2 mmol/L (ref 3.5–5.1)
SODIUM: 139 mmol/L (ref 135–145)

## 2018-04-08 MED ORDER — POLYETHYLENE GLYCOL 3350 17 GM/SCOOP PO POWD: 1.0000 | Freq: Once | ORAL | Status: DC

## 2018-04-08 MED ORDER — POLYETHYLENE GLYCOL 3350 17 GM/SCOOP PO POWD
1.0000 | Freq: Once | ORAL | Status: AC
  Administered 2018-04-08 – 2018-04-09 (×2): 255 g via ORAL
  Filled 2018-04-08: qty 255

## 2018-04-08 MED ORDER — PANTOPRAZOLE SODIUM 40 MG PO TBEC
40.0000 mg | DELAYED_RELEASE_TABLET | Freq: Every day | ORAL | Status: DC
  Administered 2018-04-09 – 2018-04-13 (×5): 40 mg via ORAL
  Filled 2018-04-08 (×5): qty 1

## 2018-04-08 MED ORDER — METOCLOPRAMIDE HCL 5 MG/ML IJ SOLN
10.0000 mg | Freq: Once | INTRAMUSCULAR | Status: AC
  Administered 2018-04-08: 10 mg via INTRAVENOUS
  Filled 2018-04-08: qty 2

## 2018-04-08 MED ORDER — METOCLOPRAMIDE HCL 5 MG/ML IJ SOLN
10.0000 mg | Freq: Once | INTRAMUSCULAR | Status: AC
  Administered 2018-04-09: 10 mg via INTRAVENOUS
  Filled 2018-04-08: qty 2

## 2018-04-08 MED ORDER — BISACODYL 5 MG PO TBEC
20.0000 mg | DELAYED_RELEASE_TABLET | Freq: Once | ORAL | Status: AC
  Administered 2018-04-08: 20 mg via ORAL
  Filled 2018-04-08: qty 4

## 2018-04-08 NOTE — H&P (View-Only) (Signed)
[facility] Consult: 9:32 AM 04/08/2018  LOS: 0 days    Referring Provider: Dr Broadus John  Primary Care Physician:  [name], MD Primary Gastroenterologist:  Dr. [name].    Reason for Consultation:  Painless bloody diarrhea, hmatochezia.     HPI: Jack [name] is a 53 y.o. male.  PMH CAD.  Ischemic cardiomyopathy, EF 35%.  Systemic vasculitis.  mutiple other medical, surgical events listed below.    Eosinophilic esophagitis.  GERD.  Crohn's ileocolitis for more than 25 years, maintained on Azathyoprin and Entyvio.  Previously resistant to Remicade, Humira.  Eosinophilic pneumonitis.   Takes Entyvio every [redacted] weeks along with 6-MP 100 mg daily.  On chronic prednisone for the eosinophilic pneumonitis with unsuccesful attempts by pulmonology to wean off prednisone.  Also has required infrequent periodic prednisone for bloody diarrhea, crohn's flares; last was in 08/2016.  This was when pulmonologist stopped Entyvio, suspecting it was causing lung issues.  Colitis subsided after Entyvio restarted.  08/2014 Colonoscopy.  By Dr. Gus Height in [place] for follow-up of Crohn's disease.  Examined ileum normal.  4, 15 to 25 mm, inflammatory looking polyps in the rectum, sigmoid, splenic and hepatic flexures.  All resected/retrieved.  Decreased mucosal vascular patterns throughout with normal mucosa in between.  Random biopsies.  Overall market improvement compared to previous exams sigmoid diverticulosis.  Internal hemorrhoids.  Pathology on the polyps confirmed inflammatory changes; mild, chronic active colitis in the sigmoid and rectum.  Mild quiescsent colitis in the descending colon.  No inflammation in the cecum, ascending, transverse colon.  No dysplasia.  Overall improvement dramatic.  Recommend continue Entyvio.   08/2016 CT enterography abdomen pelvis.  Suspected Crohn's colitis especially in the sigmoid.  Questionable mucosal enhancement/wall thickening of the TI.  Multiple hypodense liver lesions, likely cysts also present on CT in 01/2016.  1.6 sonometer, larger, right hepatic lobe lesion with internal enhancement, technically nonspecific, could be hemangioma.  If further imaging required MRI suggested.  Aortic atherosclerosis.  Suffered STEMI 01/2018,  s/p PCI/DES 02/21/18.  Post MI pericarditis and LV thrombus  Began anticlotting trifecta of Plavix, low-dose aspirin, Coumadin.  Colchicine added for the pericarditis.  Since starting new meds in 01/2018, stools changed from brown/formed 1 x day to loose 2 to 3 x day, no abdominal pain, no sxs c/w previous Crohn's flares.   24 hours of bloody stool on 12/8.  Evaluated by GI, Dr Marius Ditch, during 2 night admission in [place].  Did not undergo intestinal imaging, colonoscopy.  INR was 3.2, Hgb 14.3 but dropped to 12.9..  Aspirin held, Plavix continued.  He also had AKI.  Measures initiated included BID Protonix, cessation of 81 mg aspirin. Bleeding quickly resolved, returned to the brown diarrhea.  Yesterday he had about 7 episodes of passing pure, red blood.  Around 7 PM he had a bloody stool, at 9 PM last night the stool was very dark consistent with old blood.  He has not had any abdominal pain, anorexia, nausea or any symptoms consistent with previous acute Crohn's.  Not dizzy, weak, tachycardic.  Hgb 13.7 >> 12.8, WBCs 13.3 >> 11.7, INR 2.2 >> 1.9 overnight. FOBT +, gross blood on DRE. Coumadin, Plavix are on hold.  He took his Entyvio yesterday  He was slated for colonoscopy in early 02/2018, but this cancelled due to the STEMI, intervention.   Family history pertinent for Crohn's disease in his brother who has required surgical ostomy. His wife sets of his ill blocks for him so he is not clued into the specifics of all the meds he takes.     Past  Medical History:  Diagnosis Date  . BPH (benign prostatic hypertrophy)   . CAD (coronary artery disease)    a. anterior STEMI 01/2018 -  proximal occlusion of LAD, treated with DES. Cath also showed 20% distal LM, 95% ostial-prox small-moderate ramus, 70-80% ostial Cx, 70% dominant ostial OM, 50-60% prox Cx, 70% RCA. EF 35% by cath with LVEDP 22mHg. Med rx for residual disease. Course complicated by post MI pericarditis and LV thrombus.  . Chronic systolic [place]estive) heart failure ([place]   . Colitis   . Colon polyp    inflammatory  . Cro[place] disease ([place]   history uveitis, involvement of intestines and lungs  . Diverticulosis   . Eosinophilic granuloma [place]   . Essential hypertension   . GERD (gastroesophageal reflux disease)   . History of chicken pox   . History of gastroesophageal reflux (GERD)   . History of seizure 1995   grand mal x1, completed 6 yrs dilantin. no seizures since  . Hyperlipidemia   . Internal hemorrhoids   . Ischemic cardiomyopathy   . LV (left ventricular) mural thrombus [place] 01[place]  . Osteoporosis 11/2015   DEXA T -2.9  . Schwannoma 2007   L axilla s/p surgery  . Seizures ([place]    last seizure 1995 and only x 1 seizure-   . Ulnar neuropathy    h/o this from L arm schwannoma    Past Surgical History:  Procedure Laterality Date  . APPENDECTOMY  2000  . CARDIOVASCULAR STRESS TEST  01/2015   low risk study  . CHOLECYSTECTOMY  2000  . COLONOSCOPY  08/2014   f/u crohn's, 4 inflammatory polyps, diverticulosis, improved, rpt 2 yrs ([place])  . CORONARY ANGIOGRAPHY N/A 02/21/2018   Procedure: CORONARY ANGIOGRAPHY;  Surgeon: SBelva Crome MD;  [facility];  Service: Cardiovascular;  Laterality: N/A;  . CORONARY/GRAFT ACUTE MI REVASCULARIZATION N/A 02/20/2018   Procedure: Coronary/Graft Acute MI Revascularization;  Surgeon: SBelva Crome MD;  [facility];  Service: Cardiovascular;  Laterality: N/A;  .  ESOPHAGOGASTRODUODENOSCOPY  07/2014   h/o EE resolved, focal reflux esophagitis, chronic active gastritis  . extremity surgery Left    ulnar nerve repair after schwannoma removal  . INGUINAL HERNIA REPAIR Bilateral 2017  . LEFT HEART CATH AND CORONARY ANGIOGRAPHY N/A 02/20/2018   Procedure: LEFT HEART CATH AND CORONARY ANGIOGRAPHY;  Surgeon: SBelva Crome MD;  [facility];  Service: Cardiovascular;  Laterality: N/A;  . LUMBAR SPINE SURGERY  2011  . SPINAL CORD STIMULATOR IMPLANT  2012   x2 (Dr [place]  . TONSILLECTOMY  1996  . TUMOR REMOVAL Left 2007   schwannoma from L armpit  . UPPER GASTROINTESTINAL ENDOSCOPY      Prior to Admission medications   Medication Sig Start Date End Date Taking? Authorizing Provider  acetaminophen (TYLENOL) 325 MG tablet Take 650 mg by mouth every 6 (six) hours as needed for  mild pain or headache.    Yes [provider]  alendronate (FOSAMAX) 70 MG tablet Take 70 mg by mouth every Saturday.  01/11/18  Yes [provider]  atorvastatin (LIPITOR) 80 MG tablet Take 1 tablet (80 mg total) by mouth daily at 6 PM. 02/24/18  Yes Sande Rives E, PA-C  Calcium Carbonate-Vit D-Min (CALCIUM 1200 PO) Take 1 capsule by mouth 2 (two) times daily.   Yes [provider]  carvedilol (COREG) 3.125 MG tablet Take 1 tablet (3.125 mg total) by mouth 2 (two) times daily with a meal. 02/24/18  Yes Sande Rives E, PA-C  clopidogrel (PLAVIX) 75 MG tablet Take 1 tablet (75 mg total) by mouth daily. 02/24/18  Yes Sande Rives E, PA-C  colchicine 0.6 MG tablet Take 1 tablet (0.6 mg total) by mouth daily. 03/04/18  Yes Dunn, Nedra Hai, PA-C  diphenoxylate-atropine (LOMOTIL) 2.5-0.025 MG tablet Take 1-2 tablets by mouth 4 times daily as needed for diarrhea 04/05/18  Yes Pyrtle, Lajuan Lines, MD  furosemide (LASIX) 20 MG tablet Take only as needed 03/19/18  Yes Dunn, Dayna N, PA-C  gabapentin (NEURONTIN) 300 MG capsule Take one capsule  TID Patient taking differently: Take 300-600 mg by mouth See admin instructions. Take 1 capsule by mouth in the morning and 2 capsule at 6pm 03/25/18  Yes Draper, Christia Reading R, DO  hydrOXYzine (ATARAX/VISTARIL) 25 MG tablet TAKE 4 TABLETS (100 MG TOTAL) BY MOUTH AT BEDTIME. 01/26/18  Yes [name], MD  losartan (COZAAR) 25 MG tablet Take 1 tablet (25 mg total) by mouth daily. 02/25/18  Yes Sande Rives E, PA-C  Multiple Vitamin (MULTIVITAMIN) capsule Take 1 capsule by mouth daily.   Yes [provider]  nitroGLYCERIN (NITROSTAT) 0.4 MG SL tablet Place 1 tablet (0.4 mg total) under the tongue every 5 (five) minutes as needed for chest pain. 02/24/18 02/24/19 Yes Sande Rives E, PA-C  pantoprazole (PROTONIX) 40 MG tablet Take 1 tablet (40 mg total) by mouth 2 (two) times daily. 03/12/18 03/12/19 Yes [name], MD  predniSONE (DELTASONE) 1 MG tablet Take 2 mg by mouth See admin instructions. Take 2 mg by mouth along with 16m and 5 mg tablet to equal 141mtotal.   Yes GuRia BushMD  predniSONE (DELTASONE) 10 MG tablet Take 10 mg by mouth See admin instructions. Take 10 by mouth along with 27m75mnd 5 mg tablet to equal 30m46mtal.   Yes GutiRia Bush  predniSONE (DELTASONE) 5 MG tablet Take 5 mg by mouth See admin instructions. Take 5 mg by mouth along with 27mg 227m 10mg 49met to equal 30mg t65m.   Yes [provider]  spironolactone (ALDACTONE) 25 MG tablet Take 0.5 tablets (12.5 mg total) by mouth daily. 03/19/18 06/17/18 Yes Dunn, Dayna N, PA-C  tamsulosin (FLOMAX) 0.4 MG CAPS capsule TAKE 1 CAPSULE (0.4 MG TOTAL) BY MOUTH DAILY. 04/29/17  Yes GutierrRia Bushedolizumab (ENTYVIO) 300 MG injection Inject 300 mg into the vein every 8 (eight) weeks.    Yes [provider]  warfarin (COUMADIN) 5 MG tablet Take as directed by Coumadin clinic Patient taking differently: Take 2.5-5 mg by mouth See admin instructions. Take 5 mg by mouth daily  except 2.5 mg Tuesday and Thursday and saturday 03/19/18  Yes Smith, Belva Crome Scheduled Meds: . atorvastatin  80 mg Oral q1800  . carvedilol  3.125 mg Oral BID WC  . colchicine  0.6 mg Oral Daily  . gabapentin  300 mg Oral q morning - 10a  . gabapentin  600 mg Oral QPM  . hydrOXYzine  100 mg Oral QHS  . pantoprazole  40 mg Oral BID  . predniSONE  17 mg Oral Q breakfast  . tamsulosin  0.4 mg Oral Daily   Infusions:  PRN Meds: acetaminophen **OR** acetaminophen, Melatonin, ondansetron **OR** ondansetron (ZOFRAN) IV   Allergies as of 04/07/2018 - Review Complete 04/07/2018  Allergen Reaction Noted  . Cephalexin Nausea And Vomiting 01/28/2014  . Duloxetine Other (See Comments) 01/28/2014    Family History  Problem Relation Age of Onset  . CAD Mother 62       CABG  . Congenital heart disease Mother   . Hypertension Mother   . Hyperlipidemia Mother   . CAD Father 39       CABG  . Hypertension Father   . Hyperlipidemia Father   . Cancer Maternal Grandmother        colon  . Colon cancer Maternal Grandfather   . Diabetes Neg Hx   . Colon polyps Neg Hx   . Esophageal cancer Neg Hx   . Rectal cancer Neg Hx   . Stomach cancer Neg Hx     Social History   Socioeconomic History  . Marital status: Married    Spouse name: Not on file  . Number of children: Not on file  . Years of education: Not on file  . Highest education level: Not on file  Occupational History  . Occupation: Self-employed    Comment: Patient owns 2 [employer]  . Financial resource strain: Not on file  . Food insecurity:    Worry: Not on file    Inability: Not on file  . Transportation needs:    Medical: Not on file    Non-medical: Not on file  Tobacco Use  . Smoking status: Former Smoker    Types: Cigars    Last attempt to quit: 02/20/2018    Years since quitting: 0.1  . Smokeless tobacco: Never Used  Substance and Sexual Activity  . Alcohol use: Yes     Alcohol/week: 7.0 standard drinks    Types: 7 Cans of beer per week    Comment: 1 beer/day  . Drug use: No  . Sexual activity: Not on file  Lifestyle  . Physical activity:    Days per week: Not on file    Minutes per session: Not on file  . Stress: Not on file  Relationships  . Social connections:    Talks on phone: Not on file    Gets together: Not on file    Attends religious service: Not on file    Active member of club or organization: Not on file    Attends meetings of clubs or organizations: Not on file    Relationship status: Not on file  . Intimate partner violence:    Fear of current or ex partner: Not on file    Emotionally abused: Not on file    Physically abused: Not on file    Forced sexual activity: Not on file  Other Topics Concern  . Not on file  Social History Narrative   Lives with wife, 1 dog. Grown children   Edu: college   Occ: owns mattress store   Activity: active at work, started [employer], wants to restart running   Diet: good water, fruits/vegetables daily    REVIEW OF SYSTEMS: Constitutional: No weakness, no dizziness. ENT:  No nose bleeds  Pulm: No shortness of breath.  No cough.  Patient is on a very slow prednisone taper, decreasing dose by 1 mg every month.  Current dose is at 17 mg daily, starting February it goes to 16 mg daily.  He generally develops shortness of breath, acute cough quite rapidly when the dose gets to 15 mg historically CV:  No palpitations, no LE edema.  GU:  No hematuria, no frequency GI: Per HPI. Heme: Lower than yesterday's rectal bleeding, has not had any unusual bleeding or bruising. Transfusions: History of transfusion with blood products. Neuro:  No headaches, no peripheral tingling or numbness.  No seizures for several years. Derm:  No itching, no rash or sores.  Endocrine:  No sweats or chills.  No polyuria or dysuria Immunization: To date on his flu shot.    PHYSICAL EXAM: Vital signs in last 24  hours: Vitals:   04/07/18 2341 04/08/18 0750  BP: 110/69 118/81  Pulse: (!) 53 60  Resp: 17 18  Temp: 98.1 F (36.7 C) 98.6 F (37 C)  SpO2: 100% 100%   Wt Readings from Last 3 Encounters:  04/07/18 74.8 kg  03/22/18 78.7 kg  03/18/18 78.5 kg    General: Patient looks well, pleasant, comfortable sitting up in bed doing work on his computer. Head: No facial asymmetry or swelling.  No signs of head trauma. Eyes: Scleral icterus.  No conjunctival pallor.  EOMI. Ears: Not hard of hearing Nose: No discharge or congestion. Mouth: Oral mucosa pink, moist, clear.  Tongue midline.  Good dentition. Neck: No JVD, no masses, no thyromegaly. Lungs: Clear bilaterally.  No cough.  No dyspnea. Heart: RRR.  No MRG.  S1, S2 present. Abdomen: Soft.  Not tender or distended.  No HSM, masses, bruits, hernias.  Active bowel sounds..   Rectal: Did not repeat rectal exam performed in ED yesterday where there was gross blood. Musc/Skeltl: No joint redness, swelling, deformities. Extremities: No CCE. Neurologic: Fully alert and oriented x3.  Moves all 4 limbs with full strength.  No tremors. Skin: No rashes, no sores, no telangiectasia. Tattoos: None. Nodes: No cervical adenopathy. Psych: Calm, cooperative, pleasant, good historian other than that he does not know the names etc. of his meds.  Intake/Output from previous day: 01/08 0701 - 01/09 0700 In: 600 [P.O.:600] Out: 50 [Stool:50]  LAB RESULTS: Recent Labs    04/07/18 1533 04/07/18 2111 04/08/18 0310  WBC 13.3* 12.4* 11.7*  HGB 13.7 12.5* 12.8*  HCT 45.3 40.5 40.0  PLT 291 262 272   BMET Lab Results  Component Value Date   NA 139 04/08/2018   NA 135 04/07/2018   NA 139 03/26/2018   K 4.2 04/08/2018   K 4.7 04/07/2018   K 5.0 03/26/2018   CL 105 04/08/2018   CL 106 04/07/2018   CL 103 03/26/2018   CO2 27 04/08/2018   CO2 21 (L) 04/07/2018   CO2 24 03/26/2018   GLUCOSE 89 04/08/2018   GLUCOSE 115 (H) 04/07/2018   GLUCOSE  94 03/26/2018   BUN 20 04/08/2018   BUN 29 (H) 04/07/2018   BUN 30 (H) 03/26/2018   CREATININE 1.31 (H) 04/08/2018   CREATININE 1.27 (H) 04/07/2018   CREATININE 1.28 (H) 03/26/2018   CALCIUM 8.3 (L) 04/08/2018   CALCIUM 9.0 04/07/2018   CALCIUM 9.4 03/26/2018   LFT Recent Labs    04/07/18 1533  PROT 6.5  ALBUMIN 3.8  AST 25  ALT 32  ALKPHOS 39  BILITOT 1.0  PT/INR Lab Results  Component Value Date   INR 1.97 04/08/2018   INR 2.20 04/07/2018   INR 2.4 04/07/2018     IMPRESSION:   *  bloody stool in pt with chrohn's ileocolitis who started Plavix, Coumadin in 01/2018.  Nonbloody diarrhea since starting new meds, specifically colchicine, in 01/2018.  New, BID Protonix may also be contributing to diarrhea.    *    Recent STEMI with DES placement, complicated by LV thrombus, pericarditis.  On Plavix, Coumadin, Colchicine.    *    Mild AKI  *    Steroid dependent pneumonitis, hx vasculitis.   PLAN:     *   Drop Protonix to 1 x day.    *   Discussed with Dr. Carlean Purl, plan colonoscopy tomorrow with Dr. Wilfrid Lund.  Clear liquids, begin split dose bowel prep this evening.  *   Needs to be restarted on Plavix and Coumadin ASAP.   Colchicine Rx is 3 months for pericarditis.     Azucena Freed  04/08/2018, 9:32 AM Phone [facility] Attending   I have taken an interval history, reviewed the chart and examined the patient. I agree with the Advanced Practitioner's note, impression and recommendations.   Difficult situation - patient with Crohn's ileocolitis and bleeding on Plavix and Coumadin - and diarrhea that began after starting those medications and colchicine.  He needs a colonoscopy to sort out what is causing the bleeding and how we will treat him going forward.  The risks and benefits as well as alternatives of endoscopic procedure(s) have been discussed and reviewed. All questions answered. The patient agrees to proceed.  Gatha Mayer,  MD, [facility] 04/08/2018 5:48 PM Pager [phone]

## 2018-04-08 NOTE — Consult Note (Addendum)
[facility] Consult: 9:32 AM 04/08/2018  LOS: 0 days    Referring Provider: Dr Broadus John  Primary Care Physician:  [name], MD Primary Gastroenterologist:  Dr. [name].    Reason for Consultation:  Painless bloody diarrhea, hmatochezia.     HPI: Jack [name] is a 53 y.o. male.  PMH CAD.  Ischemic cardiomyopathy, EF 35%.  Systemic vasculitis.  mutiple other medical, surgical events listed below.    Eosinophilic esophagitis.  GERD.  Crohn's ileocolitis for more than 25 years, maintained on Azathyoprin and Entyvio.  Previously resistant to Remicade, Humira.  Eosinophilic pneumonitis.   Takes Entyvio every [redacted] weeks along with 6-MP 100 mg daily.  On chronic prednisone for the eosinophilic pneumonitis with unsuccesful attempts by pulmonology to wean off prednisone.  Also has required infrequent periodic prednisone for bloody diarrhea, crohn's flares; last was in 08/2016.  This was when pulmonologist stopped Entyvio, suspecting it was causing lung issues.  Colitis subsided after Entyvio restarted.  08/2014 Colonoscopy.  By Dr. Gus Height in [place] for follow-up of Crohn's disease.  Examined ileum normal.  4, 15 to 25 mm, inflammatory looking polyps in the rectum, sigmoid, splenic and hepatic flexures.  All resected/retrieved.  Decreased mucosal vascular patterns throughout with normal mucosa in between.  Random biopsies.  Overall market improvement compared to previous exams sigmoid diverticulosis.  Internal hemorrhoids.  Pathology on the polyps confirmed inflammatory changes; mild, chronic active colitis in the sigmoid and rectum.  Mild quiescsent colitis in the descending colon.  No inflammation in the cecum, ascending, transverse colon.  No dysplasia.  Overall improvement dramatic.  Recommend continue Entyvio.   08/2016 CT enterography abdomen pelvis.  Suspected Crohn's colitis especially in the sigmoid.  Questionable mucosal enhancement/wall thickening of the TI.  Multiple hypodense liver lesions, likely cysts also present on CT in 01/2016.  1.6 sonometer, larger, right hepatic lobe lesion with internal enhancement, technically nonspecific, could be hemangioma.  If further imaging required MRI suggested.  Aortic atherosclerosis.  Suffered STEMI 01/2018,  s/p PCI/DES 02/21/18.  Post MI pericarditis and LV thrombus  Began anticlotting trifecta of Plavix, low-dose aspirin, Coumadin.  Colchicine added for the pericarditis.  Since starting new meds in 01/2018, stools changed from brown/formed 1 x day to loose 2 to 3 x day, no abdominal pain, no sxs c/w previous Crohn's flares.   24 hours of bloody stool on 12/8.  Evaluated by GI, Dr Marius Ditch, during 2 night admission in [place].  Did not undergo intestinal imaging, colonoscopy.  INR was 3.2, Hgb 14.3 but dropped to 12.9..  Aspirin held, Plavix continued.  He also had AKI.  Measures initiated included BID Protonix, cessation of 81 mg aspirin. Bleeding quickly resolved, returned to the brown diarrhea.  Yesterday he had about 7 episodes of passing pure, red blood.  Around 7 PM he had a bloody stool, at 9 PM last night the stool was very dark consistent with old blood.  He has not had any abdominal pain, anorexia, nausea or any symptoms consistent with previous acute Crohn's.  Not dizzy, weak, tachycardic.  Hgb 13.7 >> 12.8, WBCs 13.3 >> 11.7, INR 2.2 >> 1.9 overnight. FOBT +, gross blood on DRE. Coumadin, Plavix are on hold.  He took his Entyvio yesterday  He was slated for colonoscopy in early 02/2018, but this cancelled due to the STEMI, intervention.   Family history pertinent for Crohn's disease in his brother who has required surgical ostomy. His wife sets of his ill blocks for him so he is not clued into the specifics of all the meds he takes.     Past  Medical History:  Diagnosis Date  . BPH (benign prostatic hypertrophy)   . CAD (coronary artery disease)    a. anterior STEMI 01/2018 -  proximal occlusion of LAD, treated with DES. Cath also showed 20% distal LM, 95% ostial-prox small-moderate ramus, 70-80% ostial Cx, 70% dominant ostial OM, 50-60% prox Cx, 70% RCA. EF 35% by cath with LVEDP 41mHg. Med rx for residual disease. Course complicated by post MI pericarditis and LV thrombus.  . Chronic systolic (congestive) heart failure ([place]   . Colitis   . Colon polyp    inflammatory  . Crohn disease ([place] 1992   history uveitis, involvement of intestines and lungs  . Diverticulosis   . Eosinophilic granuloma ([place]   . Essential hypertension   . GERD (gastroesophageal reflux disease)   . History of chicken pox   . History of gastroesophageal reflux (GERD)   . History of seizure 1995   grand mal x1, completed 6 yrs dilantin. no seizures since  . Hyperlipidemia   . Internal hemorrhoids   . Ischemic cardiomyopathy   . LV (left ventricular) mural thrombus following [place] ([place] 01/[place]  . Osteoporosis 11/2015   DEXA T -2.9  . Schwannoma 2007   L axilla s/p surgery  . Seizures ([place]    last seizure 1995 and only x 1 seizure-   . Ulnar neuropathy    h/o this from L arm schwannoma    Past Surgical History:  Procedure Laterality Date  . APPENDECTOMY  2000  . CARDIOVASCULAR STRESS TEST  01/2015   low risk study  . CHOLECYSTECTOMY  2000  . COLONOSCOPY  08/2014   f/u crohn's, 4 inflammatory polyps, diverticulosis, improved, rpt 2 yrs ([place])  . CORONARY ANGIOGRAPHY N/A 02/21/2018   Procedure: CORONARY ANGIOGRAPHY;  Surgeon: SBelva Crome MD;  [facility];  Service: Cardiovascular;  Laterality: N/A;  . CORONARY/GRAFT ACUTE MI REVASCULARIZATION N/A 02/20/2018   Procedure: Coronary/Graft Acute MI Revascularization;  Surgeon: SBelva Crome MD;  [facility];  Service: Cardiovascular;  Laterality: N/A;  .  ESOPHAGOGASTRODUODENOSCOPY  07/2014   h/o EE resolved, focal reflux esophagitis, chronic active gastritis  . extremity surgery Left    ulnar nerve repair after schwannoma removal  . INGUINAL HERNIA REPAIR Bilateral 2017  . LEFT HEART CATH AND CORONARY ANGIOGRAPHY N/A 02/20/2018   Procedure: LEFT HEART CATH AND CORONARY ANGIOGRAPHY;  Surgeon: SBelva Crome MD;  [facility];  Service: Cardiovascular;  Laterality: N/A;  . LUMBAR SPINE SURGERY  2011  . SPINAL CORD STIMULATOR IMPLANT  2012   x2 (Dr GBerton Mount  . TONSILLECTOMY  1996  . TUMOR REMOVAL Left 2007   schwannoma from L armpit  . UPPER GASTROINTESTINAL ENDOSCOPY      Prior to Admission medications   Medication Sig Start Date End Date Taking? Authorizing Provider  acetaminophen (TYLENOL) 325 MG tablet Take 650 mg by mouth every 6 (six) hours as needed for  mild pain or headache.    Yes [provider]  alendronate (FOSAMAX) 70 MG tablet Take 70 mg by mouth every Saturday.  01/11/18  Yes [provider]  atorvastatin (LIPITOR) 80 MG tablet Take 1 tablet (80 mg total) by mouth daily at 6 PM. 02/24/18  Yes Sande Rives E, PA-C  Calcium Carbonate-Vit D-Min (CALCIUM 1200 PO) Take 1 capsule by mouth 2 (two) times daily.   Yes [provider]  carvedilol (COREG) 3.125 MG tablet Take 1 tablet (3.125 mg total) by mouth 2 (two) times daily with a meal. 02/24/18  Yes Sande Rives E, PA-C  clopidogrel (PLAVIX) 75 MG tablet Take 1 tablet (75 mg total) by mouth daily. 02/24/18  Yes Sande Rives E, PA-C  colchicine 0.6 MG tablet Take 1 tablet (0.6 mg total) by mouth daily. 03/04/18  Yes Dunn, Nedra Hai, PA-C  diphenoxylate-atropine (LOMOTIL) 2.5-0.025 MG tablet Take 1-2 tablets by mouth 4 times daily as needed for diarrhea 04/05/18  Yes Pyrtle, Lajuan Lines, MD  furosemide (LASIX) 20 MG tablet Take only as needed 03/19/18  Yes Dunn, Dayna N, PA-C  gabapentin (NEURONTIN) 300 MG capsule Take one capsule  TID Patient taking differently: Take 300-600 mg by mouth See admin instructions. Take 1 capsule by mouth in the morning and 2 capsule at 6pm 03/25/18  Yes Draper, Christia Reading R, DO  hydrOXYzine (ATARAX/VISTARIL) 25 MG tablet TAKE 4 TABLETS (100 MG TOTAL) BY MOUTH AT BEDTIME. 01/26/18  Yes [name], MD  losartan (COZAAR) 25 MG tablet Take 1 tablet (25 mg total) by mouth daily. 02/25/18  Yes Sande Rives E, PA-C  Multiple Vitamin (MULTIVITAMIN) capsule Take 1 capsule by mouth daily.   Yes [provider]  nitroGLYCERIN (NITROSTAT) 0.4 MG SL tablet Place 1 tablet (0.4 mg total) under the tongue every 5 (five) minutes as needed for chest pain. 02/24/18 02/24/19 Yes Sande Rives E, PA-C  pantoprazole (PROTONIX) 40 MG tablet Take 1 tablet (40 mg total) by mouth 2 (two) times daily. 03/12/18 03/12/19 Yes [name], MD  predniSONE (DELTASONE) 1 MG tablet Take 2 mg by mouth See admin instructions. Take 2 mg by mouth along with 35m and 5 mg tablet to equal 170mtotal.   Yes GuRia BushMD  predniSONE (DELTASONE) 10 MG tablet Take 10 mg by mouth See admin instructions. Take 10 by mouth along with 27m20mnd 5 mg tablet to equal 60m66mtal.   Yes GutiRia Bush  predniSONE (DELTASONE) 5 MG tablet Take 5 mg by mouth See admin instructions. Take 5 mg by mouth along with 27mg 60m 10mg 59met to equal 60mg t51m.   Yes [provider]  spironolactone (ALDACTONE) 25 MG tablet Take 0.5 tablets (12.5 mg total) by mouth daily. 03/19/18 06/17/18 Yes Dunn, Dayna N, PA-C  tamsulosin (FLOMAX) 0.4 MG CAPS capsule TAKE 1 CAPSULE (0.4 MG TOTAL) BY MOUTH DAILY. 04/29/17  Yes GutierrRia Bushedolizumab (ENTYVIO) 300 MG injection Inject 300 mg into the vein every 8 (eight) weeks.    Yes [provider]  warfarin (COUMADIN) 5 MG tablet Take as directed by Coumadin clinic Patient taking differently: Take 2.5-5 mg by mouth See admin instructions. Take 5 mg by mouth daily  except 2.5 mg Tuesday and Thursday and saturday 03/19/18  Yes Smith, Belva Crome Scheduled Meds: . atorvastatin  80 mg Oral q1800  . carvedilol  3.125 mg Oral BID WC  . colchicine  0.6 mg Oral Daily  . gabapentin  300 mg Oral q morning - 10a  . gabapentin  600 mg Oral QPM  . hydrOXYzine  100 mg Oral QHS  . pantoprazole  40 mg Oral BID  . predniSONE  17 mg Oral Q breakfast  . tamsulosin  0.4 mg Oral Daily   Infusions:  PRN Meds: acetaminophen **OR** acetaminophen, Melatonin, ondansetron **OR** ondansetron (ZOFRAN) IV   Allergies as of 04/07/2018 - Review Complete 04/07/2018  Allergen Reaction Noted  . Cephalexin Nausea And Vomiting 01/28/2014  . Duloxetine Other (See Comments) 01/28/2014    Family History  Problem Relation Age of Onset  . CAD Mother 64       CABG  . Congenital heart disease Mother   . Hypertension Mother   . Hyperlipidemia Mother   . CAD Father 32       CABG  . Hypertension Father   . Hyperlipidemia Father   . Cancer Maternal Grandmother        colon  . Colon cancer Maternal Grandfather   . Diabetes Neg Hx   . Colon polyps Neg Hx   . Esophageal cancer Neg Hx   . Rectal cancer Neg Hx   . Stomach cancer Neg Hx     Social History   Socioeconomic History  . Marital status: Married    Spouse name: Not on file  . Number of children: Not on file  . Years of education: Not on file  . Highest education level: Not on file  Occupational History  . Occupation: Self-employed    Comment: Patient owns 2 [employer]  . Financial resource strain: Not on file  . Food insecurity:    Worry: Not on file    Inability: Not on file  . Transportation needs:    Medical: Not on file    Non-medical: Not on file  Tobacco Use  . Smoking status: Former Smoker    Types: Cigars    Last attempt to quit: 02/20/2018    Years since quitting: 0.1  . Smokeless tobacco: Never Used  Substance and Sexual Activity  . Alcohol use: Yes     Alcohol/week: 7.0 standard drinks    Types: 7 Cans of beer per week    Comment: 1 beer/day  . Drug use: No  . Sexual activity: Not on file  Lifestyle  . Physical activity:    Days per week: Not on file    Minutes per session: Not on file  . Stress: Not on file  Relationships  . Social connections:    Talks on phone: Not on file    Gets together: Not on file    Attends religious service: Not on file    Active member of club or organization: Not on file    Attends meetings of clubs or organizations: Not on file    Relationship status: Not on file  . Intimate partner violence:    Fear of current or ex partner: Not on file    Emotionally abused: Not on file    Physically abused: Not on file    Forced sexual activity: Not on file  Other Topics Concern  . Not on file  Social History Narrative   Lives with wife, 1 dog. Grown children   Edu: college   Occ: owns mattress store   Activity: active at work, started [employer], wants to restart running   Diet: good water, fruits/vegetables daily    REVIEW OF SYSTEMS: Constitutional: No weakness, no dizziness. ENT:  No nose bleeds  Pulm: No shortness of breath.  No cough.  Patient is on a very slow prednisone taper, decreasing dose by 1 mg every month.  Current dose is at 17 mg daily, starting February it goes to 16 mg daily.  He generally develops shortness of breath, acute cough quite rapidly when the dose gets to 15 mg historically CV:  No palpitations, no LE edema.  GU:  No hematuria, no frequency GI: Per HPI. Heme: Lower than yesterday's rectal bleeding, has not had any unusual bleeding or bruising. Transfusions: History of transfusion with blood products. Neuro:  No headaches, no peripheral tingling or numbness.  No seizures for several years. Derm:  No itching, no rash or sores.  Endocrine:  No sweats or chills.  No polyuria or dysuria Immunization: To date on his flu shot.    PHYSICAL EXAM: Vital signs in last 24  hours: Vitals:   04/07/18 2341 04/08/18 0750  BP: 110/69 118/81  Pulse: (!) 53 60  Resp: 17 18  Temp: 98.1 F (36.7 C) 98.6 F (37 C)  SpO2: 100% 100%   Wt Readings from Last 3 Encounters:  04/07/18 74.8 kg  03/22/18 78.7 kg  03/18/18 78.5 kg    General: Patient looks well, pleasant, comfortable sitting up in bed doing work on his computer. Head: No facial asymmetry or swelling.  No signs of head trauma. Eyes: Scleral icterus.  No conjunctival pallor.  EOMI. Ears: Not hard of hearing Nose: No discharge or congestion. Mouth: Oral mucosa pink, moist, clear.  Tongue midline.  Good dentition. Neck: No JVD, no masses, no thyromegaly. Lungs: Clear bilaterally.  No cough.  No dyspnea. Heart: RRR.  No MRG.  S1, S2 present. Abdomen: Soft.  Not tender or distended.  No HSM, masses, bruits, hernias.  Active bowel sounds..   Rectal: Did not repeat rectal exam performed in ED yesterday where there was gross blood. Musc/Skeltl: No joint redness, swelling, deformities. Extremities: No CCE. Neurologic: Fully alert and oriented x3.  Moves all 4 limbs with full strength.  No tremors. Skin: No rashes, no sores, no telangiectasia. Tattoos: None. Nodes: No cervical adenopathy. Psych: Calm, cooperative, pleasant, good historian other than that he does not know the names etc. of his meds.  Intake/Output from previous day: 01/08 0701 - 01/09 0700 In: 600 [P.O.:600] Out: 50 [Stool:50]  LAB RESULTS: Recent Labs    04/07/18 1533 04/07/18 2111 04/08/18 0310  WBC 13.3* 12.4* 11.7*  HGB 13.7 12.5* 12.8*  HCT 45.3 40.5 40.0  PLT 291 262 272   BMET Lab Results  Component Value Date   NA 139 04/08/2018   NA 135 04/07/2018   NA 139 03/26/2018   K 4.2 04/08/2018   K 4.7 04/07/2018   K 5.0 03/26/2018   CL 105 04/08/2018   CL 106 04/07/2018   CL 103 03/26/2018   CO2 27 04/08/2018   CO2 21 (L) 04/07/2018   CO2 24 03/26/2018   GLUCOSE 89 04/08/2018   GLUCOSE 115 (H) 04/07/2018   GLUCOSE  94 03/26/2018   BUN 20 04/08/2018   BUN 29 (H) 04/07/2018   BUN 30 (H) 03/26/2018   CREATININE 1.31 (H) 04/08/2018   CREATININE 1.27 (H) 04/07/2018   CREATININE 1.28 (H) 03/26/2018   CALCIUM 8.3 (L) 04/08/2018   CALCIUM 9.0 04/07/2018   CALCIUM 9.4 03/26/2018   LFT Recent Labs    04/07/18 1533  PROT 6.5  ALBUMIN 3.8  AST 25  ALT 32  ALKPHOS 39  BILITOT 1.0  PT/INR Lab Results  Component Value Date   INR 1.97 04/08/2018   INR 2.20 04/07/2018   INR 2.4 04/07/2018     IMPRESSION:   *  bloody stool in pt with chrohn's ileocolitis who started Plavix, Coumadin in 01/2018.  Nonbloody diarrhea since starting new meds, specifically colchicine, in 01/2018.  New, BID Protonix may also be contributing to diarrhea.    *    Recent STEMI with DES placement, complicated by LV thrombus, pericarditis.  On Plavix, Coumadin, Colchicine.    *    Mild AKI  *    Steroid dependent pneumonitis, hx vasculitis.   PLAN:     *   Drop Protonix to 1 x day.    *   Discussed with Dr. Carlean Purl, plan colonoscopy tomorrow with Dr. Wilfrid Lund.  Clear liquids, begin split dose bowel prep this evening.  *   Needs to be restarted on Plavix and Coumadin ASAP.   Colchicine Rx is 3 months for pericarditis.     Azucena Freed  04/08/2018, 9:32 AM Phone [place] Attending   I have taken an interval history, reviewed the chart and examined the patient. I agree with the Advanced Practitioner's note, impression and recommendations.   Difficult situation - patient with Crohn's ileocolitis and bleeding on Plavix and Coumadin - and diarrhea that began after starting those medications and colchicine.  He needs a colonoscopy to sort out what is causing the bleeding and how we will treat him going forward.  The risks and benefits as well as alternatives of endoscopic procedure(s) have been discussed and reviewed. All questions answered. The patient agrees to proceed.  Gatha Mayer,  MD, [facility] 04/08/2018 5:48 PM Pager [phone]

## 2018-04-08 NOTE — Progress Notes (Addendum)
PROGRESS NOTE    Izsak Meir Mckendry  MOL:[id number] DOB: April 09, 1965 DOA: 04/07/2018 PCP: [name], MD  Brief Narrative: 53 y.o. male with medical history significant for recent STEMI s/p LAD PCI in 01/2018, ischemic cardiomyopathy, EF 30%, LV thrombus on coumadin, pericarditis, crohn disease on biologics and prednisone, chronic back pain s/p Spinal stimulator presented to ED with 6-7 episodes of hematochezia, in the emergency room found to have a hemoglobin hemoglobin of 13, INR was 2.4.  Coumadin and Plavix were held yesterday   Assessment & Plan:   Hematochezia -In the background of Plavix, Coumadin use and history of Crohn's disease -No further bleeding in the last 12 hours, Coumadin and Plavix were held yesterday, anticoagulation was not reversed as bleeding appears to be subsiding -Gastroenterology consulted, hemoglobin relatively stable today -History of Crohn's disease, no symptoms of active flare  -GI to determine plan regarding flex sig versus colonoscopy -Cardiology consulted given antiplatelet, anticoagulation with recent STEMI, LAD stent and LV thrombus  LV thrombus -In the background of ischemic cardiomyopathy and recent MI -Coumadin held but not reversed, INR is 1.97 today -he will need to be restarted on anticoagulation soon -Cardiology consulted  History of recent STEMI -LAD stenosis status post PCI and drug-eluting stent in 11/201 9 -On Plavix at baseline, held as above  Ischemic cardiomyopathy Secondary to STEMI. Current EF of 35-40% from Transthoracic Echocardiogram on 12/27.  -Currently on lasix as needed and spironolactone -Clinically appears euvolemic, hold diuretics today  Essential hypertension Patient is on Coreg, losartan and spironolactone as an outpatient. -Hold Coreg, losartan, spironolactone in setting of hematochezia  History of Crohn's disease -On prednisone for many years and Biologics every 8 weeks, last dose was yesterday -Continue  home regimen of prednisone  BPH -Continue Flomax  Pericarditis post MI -Continue colchicine  CKD stage III Stable.  Hyperlipidemia -Continue Lipitor  GERD -Continue Protonix  DVT prophylaxis: SCDs Code Status: Full code Family Communication: None at bedside Disposition Plan: Discharge pending GI workup/management, resolution of bleeding  Consultants:  Gastroenterology Cardiology   Procedures:   Antimicrobials:    Subjective: -Okay, no complaints, -No bleeding since 9 PM last night -Denies any abdominal pain no nausea or vomiting  Objective: Vitals:   04/07/18 1822 04/07/18 1857 04/07/18 2341 04/08/18 0750  BP: 136/85 133/84 110/69 118/81  Pulse: 73 70 (!) 53 60  Resp: 11 18 17 18   Temp:  98 F (36.7 C) 98.1 F (36.7 C) 98.6 F (37 C)  TempSrc:  Oral Oral Oral  SpO2: 100% 100% 100% 100%  Weight:  74.8 kg    Height:  5' 6.5" (1.689 m)      Intake/Output Summary (Last 24 hours) at 04/08/2018 1056 Last data filed at 04/07/2018 2100 Gross per 24 hour  Intake 600 ml  Output 50 ml  Net 550 ml   Filed Weights   04/07/18 1857  Weight: 74.8 kg    Examination:  General exam: Appears calm and comfortable, thin male, sitting up in bed, no distress Respiratory system: Clear to auscultation. Respiratory effort normal. Cardiovascular system: S1 & S2 heard, RRR. Gastrointestinal system: Abdomen is nondistended, soft and nontender.Normal bowel sounds heard. Central nervous system: Alert and oriented. No focal neurological deficits. Extremities: no Edema Skin: No rashes, lesions or ulcers Psychiatry: Judgement and insight appear normal. Mood & affect appropriate.     Data Reviewed:   CBC: Recent Labs  Lab 04/07/18 1209 04/07/18 1533 04/07/18 2111 04/08/18 0310  WBC 14.7* 13.3* 12.4* 11.7*  HGB  13.0 13.7 12.5* 12.8*  HCT 39.5 45.3 40.5 40.0  MCV 88 92.6 90.8 91.5  PLT 284 291 262 161   Basic Metabolic Panel: Recent Labs  Lab 04/07/18 1533  04/08/18 0310  NA 135 139  K 4.7 4.2  CL 106 105  CO2 21* 27  GLUCOSE 115* 89  BUN 29* 20  CREATININE 1.27* 1.31*  CALCIUM 9.0 8.3*   GFR: Estimated Creatinine Clearance: 62 mL/min (A) (by C-G formula based on SCr of 1.31 mg/dL (H)). Liver Function Tests: Recent Labs  Lab 04/07/18 1533  AST 25  ALT 32  ALKPHOS 39  BILITOT 1.0  PROT 6.5  ALBUMIN 3.8   No results for input(s): LIPASE, AMYLASE in the last 168 hours. No results for input(s): AMMONIA in the last 168 hours. Coagulation Profile: Recent Labs  Lab 04/07/18 1158 04/07/18 1533 04/08/18 0310  INR 2.4 2.20 1.97   Cardiac Enzymes: No results for input(s): CKTOTAL, CKMB, CKMBINDEX, TROPONINI in the last 168 hours. BNP (last 3 results) Recent Labs    03/18/18 1524 03/22/18 1140  PROBNP 2,934* 2,573*   HbA1C: No results for input(s): HGBA1C in the last 72 hours. CBG: No results for input(s): GLUCAP in the last 168 hours. Lipid Profile: No results for input(s): CHOL, HDL, LDLCALC, TRIG, CHOLHDL, LDLDIRECT in the last 72 hours. Thyroid Function Tests: No results for input(s): TSH, T4TOTAL, FREET4, T3FREE, THYROIDAB in the last 72 hours. Anemia Panel: No results for input(s): VITAMINB12, FOLATE, FERRITIN, TIBC, IRON, RETICCTPCT in the last 72 hours. Urine analysis: No results found for: COLORURINE, APPEARANCEUR, LABSPEC, PHURINE, GLUCOSEU, HGBUR, BILIRUBINUR, KETONESUR, PROTEINUR, UROBILINOGEN, NITRITE, LEUKOCYTESUR Sepsis Labs: @LABRCNTIP (procalcitonin:4,lacticidven:4)  )No results found for this or any previous visit (from the past 240 hour(s)).       Radiology Studies: No results found.      Scheduled Meds: . atorvastatin  80 mg Oral q1800  . carvedilol  3.125 mg Oral BID WC  . colchicine  0.6 mg Oral Daily  . gabapentin  300 mg Oral q morning - 10a  . gabapentin  600 mg Oral QPM  . hydrOXYzine  100 mg Oral QHS  . metoCLOPramide (REGLAN) injection  10 mg Intravenous Once   Followed by  .  metoCLOPramide (REGLAN) injection  10 mg Intravenous Once  . [START ON 04/09/2018] pantoprazole  40 mg Oral Daily  . polyethylene glycol powder  1 Container Oral Once  . predniSONE  17 mg Oral Q breakfast  . tamsulosin  0.4 mg Oral Daily   Continuous Infusions:   LOS: 0 days    Time spent: 65mn    PDomenic Polite MD [facility] Page via [email or web address], password [email or web address] After 7PM please contact night-coverage  04/08/2018, 10:56 AM

## 2018-04-08 NOTE — Consult Note (Signed)
Cardiology Consultation:   Patient ID: Jack [name] MRN: [id number]; DOB: 28-Apr-1965  Admit date: 04/07/2018 Date of Consult: 04/08/2018  Primary Care Provider: [name], MD Primary Cardiologist: Jack Grooms, MD  Primary Electrophysiologist:  None    Patient Profile:   Jack [name] is a 53 y.o. male with a hx of anterior STEMI, LAD PCI, post-MI pericarditis, and LV thrombus who is being seen today for the evaluation of recurrent gastrointestinal bleeding at the request of Jack [name].  History of Present Illness:   Jack [name] presents with recurrent hematochezia.  The patient was hospitalized February 20, 2018 with an anterior STEMI complicated by severe LV systolic dysfunction, LV apical thrombus, and post MI pericarditis.  He was initially treated with "triple therapy" using aspirin 81 mg, clopidogrel 75 mg, and warfarin.  He developed rectal bleeding and was evaluated at [facility].  Aspirin was discontinued.  He maintained clopidogrel and warfarin.  Endoscopic evaluation was deferred at that time.  The patient did not have a significant drop in his hemoglobin during that admission.  He now presents again with rectal bleeding and plans are noted for colonoscopy tomorrow.  Clopidogrel and warfarin have both been placed on hold.  The patient's hemoglobin has remained stable with a hemoglobin of 12.8 mg/dL this morning.  His INR is currently 1.97.  The patient reported several bloody bowel movements yesterday with bright red blood.  States that he had some dark blood last night.  He has not had any recent cardiac symptoms.  He specifically denies chest pain, shortness of breath, heart palpitations, lightheadedness, syncope, leg swelling, orthopnea, or PND.  He has been compliant with his medications.  He has remained on clopidogrel 75 mg and warfarin until this hospital admission.  Past Medical History:  Diagnosis Date  . BPH (benign prostatic  hypertrophy)   . CAD (coronary artery disease)    a. anterior STEMI 01/2018 -  proximal occlusion of LAD, treated with DES. Cath also showed 20% distal LM, 95% ostial-prox small-moderate ramus, 70-80% ostial Cx, 70% dominant ostial OM, 50-60% prox Cx, 70% RCA. EF 35% by cath with LVEDP 42mHg. Med rx for residual disease. Course complicated by post MI pericarditis and LV thrombus.  . Chronic systolic (congestive) heart failure ([facility]   . Colitis   . Colon polyp    inflammatory  . Crohn disease ([facility] 1992   history uveitis, involvement of intestines and lungs  . Diverticulosis   . Eosinophilic granuloma ([facility]   . Essential hypertension   . GERD (gastroesophageal reflux disease)   . History of chicken pox   . History of gastroesophageal reflux (GERD)   . History of seizure 1995   grand mal x1, completed 6 yrs dilantin. no seizures since  . Hyperlipidemia   . Internal hemorrhoids   . Ischemic cardiomyopathy   . LV (left ventricular) mural thrombus following MI ([facility] 01/2018  . Osteoporosis 11/2015   DEXA T -2.9  . Schwannoma 2007   L axilla s/p surgery  . Seizures ([facility] [place]    last seizure 1995 and only x 1 seizure-   . Ulnar neuropathy    h/o this from L arm schwannoma    Past Surgical History:  Procedure Laterality Date  . APPENDECTOMY  2000  . CARDIOVASCULAR STRESS TEST  01/2015   low risk study  . CHOLECYSTECTOMY  2000  . COLONOSCOPY  08/2014   f/u crohn's, 4 inflammatory polyps, diverticulosis, improved, rpt 2 yrs (Jack [name])  .  CORONARY ANGIOGRAPHY N/A 02/21/2018   Procedure: CORONARY ANGIOGRAPHY;  Surgeon: Jack Crome, MD;  Location: [place] [facility];  Service: Cardiovascular;  Laterality: N/A;  . CORONARY/GRAFT ACUTE MI REVASCULARIZATION N/A 02/20/2018   Procedure: Coronary/Graft Acute MI Revascularization;  Surgeon: Jack Crome, MD;  Location: [place] [facility];  Service: Cardiovascular;  Laterality: N/A;  . ESOPHAGOGASTRODUODENOSCOPY  07/2014   h/o EE resolved,  focal reflux esophagitis, chronic active gastritis  . extremity surgery Left    ulnar nerve repair after schwannoma removal  . INGUINAL HERNIA REPAIR Bilateral 2017  . LEFT HEART CATH AND CORONARY ANGIOGRAPHY N/A 02/20/2018   Procedure: LEFT HEART CATH AND CORONARY ANGIOGRAPHY;  Surgeon: Jack Crome, MD;  Location: [place] [facility];  Service: Cardiovascular;  Laterality: N/A;  . LUMBAR SPINE SURGERY  2011  . SPINAL CORD STIMULATOR IMPLANT  2012   x2 (Jack Jack [name])  . TONSILLECTOMY  1996  . TUMOR REMOVAL Left 2007   schwannoma from L armpit  . UPPER GASTROINTESTINAL ENDOSCOPY       Home Medications:  Prior to Admission medications   Medication Sig Start Date End Date Taking? Authorizing Provider  acetaminophen (TYLENOL) 325 MG tablet Take 650 mg by mouth every 6 (six) hours as needed for mild pain or headache.    Yes [provider]  alendronate (FOSAMAX) 70 MG tablet Take 70 mg by mouth every Saturday.  01/11/18  Yes [provider]  atorvastatin (LIPITOR) 80 MG tablet Take 1 tablet (80 mg total) by mouth daily at 6 PM. 02/24/18  Yes Jack Rives E, PA-C  Calcium Carbonate-Vit D-Min (CALCIUM 1200 PO) Take 1 capsule by mouth 2 (two) times daily.   Yes [provider]  carvedilol (COREG) 3.125 MG tablet Take 1 tablet (3.125 mg total) by mouth 2 (two) times daily with a meal. 02/24/18  Yes Jack Rives E, PA-C  clopidogrel (PLAVIX) 75 MG tablet Take 1 tablet (75 mg total) by mouth daily. 02/24/18  Yes Jack Rives E, PA-C  colchicine 0.6 MG tablet Take 1 tablet (0.6 mg total) by mouth daily. 03/04/18  Yes [name], Jack Hai, PA-C  diphenoxylate-atropine (LOMOTIL) 2.5-0.025 MG tablet Take 1-2 tablets by mouth 4 times daily as needed for diarrhea 04/05/18  Yes Pyrtle, Lajuan Lines, MD  furosemide (LASIX) 20 MG tablet Take only as needed 03/19/18  Yes [name], Jack N, PA-C  gabapentin (NEURONTIN) 300 MG capsule Take one capsule TID Patient taking differently: Take  300-600 mg by mouth See admin instructions. Take 1 capsule by mouth in the morning and 2 capsule at 6pm 03/25/18  Yes Draper, Christia Reading R, DO  hydrOXYzine (ATARAX/VISTARIL) 25 MG tablet TAKE 4 TABLETS (100 MG TOTAL) BY MOUTH AT BEDTIME. 01/26/18  Yes Jack Bush, MD  losartan (COZAAR) 25 MG tablet Take 1 tablet (25 mg total) by mouth daily. 02/25/18  Yes Jack Rives E, PA-C  Multiple Vitamin (MULTIVITAMIN) capsule Take 1 capsule by mouth daily.   Yes [provider]  nitroGLYCERIN (NITROSTAT) 0.4 MG SL tablet Place 1 tablet (0.4 mg total) under the tongue every 5 (five) minutes as needed for chest pain. 02/24/18 02/24/19 Yes Jack Rives E, PA-C  pantoprazole (PROTONIX) 40 MG tablet Take 1 tablet (40 mg total) by mouth 2 (two) times daily. 03/12/18 03/12/19 Yes Jack Bush, MD  predniSONE (DELTASONE) 1 MG tablet Take 2 mg by mouth See admin instructions. Take 2 mg by mouth along with 29m and 5 mg tablet to equal 182mtotal.  Yes Jack Bush, MD  predniSONE (DELTASONE) 10 MG tablet Take 10 mg by mouth See admin instructions. Take 10 by mouth along with 34m and 5 mg tablet to equal 120mtotal.   Yes GuRia BushMD  predniSONE (DELTASONE) 5 MG tablet Take 5 mg by mouth See admin instructions. Take 5 mg by mouth along with 29m4mnd 19m40mblet to equal 17mg36mal.   Yes [provider]  spironolactone (ALDACTONE) 25 MG tablet Take 0.5 tablets (12.5 mg total) by mouth daily. 03/19/18 06/17/18 Yes [name], Jack N, PA-C  tamsulosin (FLOMAX) 0.4 MG CAPS capsule TAKE 1 CAPSULE (0.4 MG TOTAL) BY MOUTH DAILY. 04/29/17  Yes GutieRia [name] vedolizumab (ENTYVIO) 300 MG injection Inject 300 mg into the vein every 8 (eight) weeks.    Yes [provider]  warfarin (COUMADIN) 5 MG tablet Take as directed by Coumadin clinic Patient taking differently: Take 2.5-5 mg by mouth See admin instructions. Take 5 mg by mouth daily except 2.5 mg Tuesday and Thursday  and saturday 03/19/18  Yes SmithBelva [name]   Inpatient Medications: Scheduled Meds: . atorvastatin  80 mg Oral q1800  . carvedilol  3.125 mg Oral BID WC  . colchicine  0.6 mg Oral Daily  . gabapentin  300 mg Oral q morning - 10a  . gabapentin  600 mg Oral QPM  . hydrOXYzine  100 mg Oral QHS  . metoCLOPramide (REGLAN) injection  10 mg Intravenous Once   Followed by  . metoCLOPramide (REGLAN) injection  10 mg Intravenous Once  . [START ON 04/09/2018] pantoprazole  40 mg Oral Daily  . polyethylene glycol powder  1 Container Oral Once  . predniSONE  17 mg Oral Q breakfast  . tamsulosin  0.4 mg Oral Daily   Continuous Infusions:  PRN Meds: acetaminophen **OR** acetaminophen, Melatonin, ondansetron **OR** ondansetron (ZOFRAN) IV  Allergies:    Allergies  Allergen Reactions  . Cephalexin Nausea And Vomiting    Other Reaction: GI UPSET  . Duloxetine Other (See Comments)    Headaches     Social History:   Social History   Socioeconomic History  . Marital status: Married    Spouse name: Not on file  . Number of children: Not on file  . Years of education: Not on file  . Highest education level: Not on file  Occupational History  . Occupation: Self-employed    Comment: Patient owns 2 mattrNormaninancial resource strain: Not on file  . Food insecurity:    Worry: Not on file    Inability: Not on file  . Transportation needs:    Medical: Not on file    Non-medical: Not on file  Tobacco Use  . Smoking status: Former Smoker    Types: Cigars    Last attempt to quit: 02/20/2018    Years since quitting: 0.1  . Smokeless tobacco: Never Used  Substance and Sexual Activity  . Alcohol use: Yes    Alcohol/week: 7.0 standard drinks    Types: 7 Cans of beer per week    Comment: 1 beer/day  . Drug use: No  . Sexual activity: Not on file  Lifestyle  . Physical activity:    Days per week: Not on file    Minutes per session: Not on file  . Stress:  Not on file  Relationships  . Social connections:    Talks on phone: Not on file    Gets together: Not  on file    Attends religious service: Not on file    Active member of club or organization: Not on file    Attends meetings of clubs or organizations: Not on file    Relationship status: Not on file  . Intimate partner violence:    Fear of current or ex partner: Not on file    Emotionally abused: Not on file    Physically abused: Not on file    Forced sexual activity: Not on file  Other Topics Concern  . Not on file  Social History Narrative   Lives with wife, 1 dog. Grown children   Edu: college   Occ: owns mattress store   Activity: active at work, started [employer], wants to restart running   Diet: good water, fruits/vegetables daily    Family History:   Family History  Problem Relation Age of Onset  . CAD Mother 73       CABG  . Congenital heart disease Mother   . Hypertension Mother   . Hyperlipidemia Mother   . CAD Father 72       CABG  . Hypertension Father   . Hyperlipidemia Father   . Cancer Maternal Grandmother        colon  . Colon cancer Maternal Grandfather   . Diabetes Neg Hx   . Colon polyps Neg Hx   . Esophageal cancer Neg Hx   . Rectal cancer Neg Hx   . Stomach cancer Neg Hx      ROS:  Please see the history of present illness.  All other ROS reviewed and negative.     Physical Exam/Data:   Vitals:   04/07/18 1822 04/07/18 1857 04/07/18 2341 04/08/18 0750  BP: 136/85 133/84 110/69 118/81  Pulse: 73 70 (!) 53 60  Resp: 11 18 17 18   Temp:  98 F (36.7 C) 98.1 F (36.7 C) 98.6 F (37 C)  TempSrc:  Oral Oral Oral  SpO2: 100% 100% 100% 100%  Weight:  74.8 kg    Height:  5' 6.5" (1.689 m)      Intake/Output Summary (Last 24 hours) at 04/08/2018 1309 Last data filed at 04/07/2018 2100 Gross per 24 hour  Intake 600 ml  Output 50 ml  Net 550 ml   Filed Weights   04/07/18 1857  Weight: 74.8 kg   Body mass index is 26.23 kg/m.  General:   Well nourished, well developed, in no acute distress HEENT: normal Lymph: no adenopathy Neck: no JVD Endocrine:  No thryomegaly Vascular: No carotid bruits; FA pulses 2+ bilaterally  Cardiac:  normal S1, S2; RRR; no murmur  Lungs:  clear to auscultation bilaterally, no wheezing, rhonchi or rales  Abd: soft, nontender, no hepatomegaly  Ext: no edema Musculoskeletal:  No deformities, BUE and BLE strength normal and equal Skin: warm and dry  Neuro:  CNs 2-12 intact, no focal abnormalities noted Psych:  Normal affect   Relevant CV Studies: Echo 02/22/2018: Study Conclusions  - Left ventricle: The cavity size was normal. Wall thickness was   normal. Systolic function was moderately reduced. The estimated   ejection fraction was in the range of 35% to 40%. Doppler   parameters are consistent with abnormal left ventricular   relaxation (grade 1 diastolic dysfunction). Spontaneous echo   contrast, sludge, and probable apical echo density in the left   ventricle with concern for LV apical thrombus. - Regional wall motion abnormality: Akinesis of the apical   anterior, mid anteroseptal, mid  inferoseptal, apical inferior,   apical septal, and apical myocardium; severe hypokinesis of the   mid anterior myocardium; mild hypokinesis of the basal   anteroseptal myocardium. - Aortic valve: Transvalvular velocity was within the normal range.   There was no stenosis. There was no regurgitation. - Mitral valve: Transvalvular velocity was within the normal range.   There was no evidence for stenosis. There was no regurgitation. - Left atrium: The atrium was normal in size. - Right ventricle: The cavity size was normal. Wall thickness was   normal. Systolic function was normal. - Right atrium: The atrium was normal in size. Central venous   pressure (est): 3 mm Hg. - Tricuspid valve: Transvalvular velocity was within the normal   range. There was trivial regurgitation. - Inferior vena cava:  The vessel was normal in size. The   respirophasic diameter changes were in the normal range (= 50%),   consistent with normal central venous pressure. - Pericardium, extracardiac: There was no pericardial effusion.  Impressions:  - Spontaneous echo contrast, sludge, and probable apical echo   density in the left ventricle suggest LV apical thrombus.  Recommendations:  This procedure has been discussed with the referring physician (Jack. Irish Lack at 13:15 02/22/18)  Echo 03/26/2018: Study Conclusions  - Left ventricle: The cavity size was normal. Wall thickness was   increased in a pattern of moderate LVH. Systolic function was   moderately reduced. The estimated ejection fraction was in the   range of 35% to 40%. Akinesis of the mid-apicalanteroseptal,   anterior, inferoseptal, and apical myocardium. Doppler parameters   are consistent with abnormal left ventricular relaxation (grade 1   diastolic dysfunction). - Aortic valve: Transvalvular velocity was within the normal range.   There was no stenosis. There was no regurgitation. - Mitral valve: Transvalvular velocity was within the normal range.   There was no evidence for stenosis. There was trivial   regurgitation. - Right ventricle: The cavity size was normal. Wall thickness was   normal. Systolic function was normal. - Atrial septum: No defect or patent foramen ovale was identified   by color flow Doppler. - Tricuspid valve: There was trivial regurgitation. - Pulmonary arteries: Systolic pressure was within the normal   range. PA peak pressure: 27 mm Hg (S).  Laboratory Data:  Chemistry Recent Labs  Lab 04/07/18 1533 04/08/18 0310  NA 135 139  K 4.7 4.2  CL 106 105  CO2 21* 27  GLUCOSE 115* 89  BUN 29* 20  CREATININE 1.27* 1.31*  CALCIUM 9.0 8.3*  GFRNONAA >60 >60  GFRAA >60 >60  ANIONGAP 8 7    Recent Labs  Lab 04/07/18 1533  PROT 6.5  ALBUMIN 3.8  AST 25  ALT 32  ALKPHOS 39  BILITOT 1.0    Hematology Recent Labs  Lab 04/07/18 1533 04/07/18 2111 04/08/18 0310  WBC 13.3* 12.4* 11.7*  RBC 4.89 4.46 4.37  HGB 13.7 12.5* 12.8*  HCT 45.3 40.5 40.0  MCV 92.6 90.8 91.5  MCH 28.0 28.0 29.3  MCHC 30.2 30.9 32.0  RDW 13.4 13.3 13.4  PLT 291 262 272   Cardiac EnzymesNo results for input(s): TROPONINI in the last 168 hours. No results for input(s): TROPIPOC in the last 168 hours.  BNPNo results for input(s): BNP, PROBNP in the last 168 hours.  DDimer No results for input(s): DDIMER in the last 168 hours.  Radiology/Studies:  No results found.  Assessment and Plan:   1. Recent anterior wall STEMI treated with  primary PCI (drug-eluting stent in the proximal LAD) 02/20/2018 2. Chronic systolic heart failure, currently well compensated, secondary to severe underlying ischemic cardiomyopathy 3. LV apical thrombus 4. Recurrent lower gastrointestinal bleeding  Discussed options at length with the patient.  He is having recurrent rectal bleeding/lower GI bleeding without major drop in hemoglobin.  GI evaluation reviewed with plans for colonoscopy tomorrow.  Clopidogrel and warfarin are on hold.  INR on admission was in a therapeutic range at 2.4 and is currently dropped down to 1.97.  He needs to be started back on clopidogrel as soon as possible and hopefully this can be resumed tomorrow after his colonoscopy.  I personally reviewed his most recent echo study which did not demonstrate any residual thrombus in the LV apex.  However, he has a large area of akinesis throughout the entire apex with swirling seen on echo contrast images.  If he is able to stay on warfarin I think he should continue on anticoagulation for a period of 3 months from his MI if possible.  We will follow-up tomorrow after his colonoscopy with further recommendations on antiplatelet and anticoagulant therapy at that time.  All of his questions are answered today.  For questions or updates, please contact [place] Please consult [email or web address] for contact info under   Signed, Sherren Mocha, MD  04/08/2018 1:09 PM

## 2018-04-08 NOTE — Telephone Encounter (Signed)
Patient currently in hospital for GI Bleed

## 2018-04-09 ENCOUNTER — Encounter (HOSPITAL_COMMUNITY): Payer: Self-pay | Admitting: Certified Registered Nurse Anesthetist

## 2018-04-09 ENCOUNTER — Encounter (HOSPITAL_COMMUNITY)
Admission: EM | Disposition: A | Discharge status: Home / Self Care | Payer: Self-pay | Source: Home / Self Care | Attending: Internal Medicine

## 2018-04-09 ENCOUNTER — Observation Stay (HOSPITAL_COMMUNITY): Payer: BLUE CROSS/BLUE SHIELD | Admitting: Anesthesiology

## 2018-04-09 DIAGNOSIS — K5140 Inflammatory polyps of colon without complications: Secondary | ICD-10-CM | POA: Diagnosis not present

## 2018-04-09 DIAGNOSIS — K921 Melena: Secondary | ICD-10-CM

## 2018-04-09 DIAGNOSIS — D124 Benign neoplasm of descending colon: Secondary | ICD-10-CM | POA: Diagnosis not present

## 2018-04-09 DIAGNOSIS — K621 Rectal polyp: Secondary | ICD-10-CM | POA: Diagnosis not present

## 2018-04-09 DIAGNOSIS — K6289 Other specified diseases of anus and rectum: Secondary | ICD-10-CM | POA: Diagnosis not present

## 2018-04-09 DIAGNOSIS — D123 Benign neoplasm of transverse colon: Secondary | ICD-10-CM | POA: Diagnosis not present

## 2018-04-09 DIAGNOSIS — D62 Acute posthemorrhagic anemia: Secondary | ICD-10-CM | POA: Diagnosis not present

## 2018-04-09 DIAGNOSIS — I255 Ischemic cardiomyopathy: Secondary | ICD-10-CM

## 2018-04-09 HISTORY — PX: SUBMUCOSAL INJECTION: SHX5543

## 2018-04-09 HISTORY — PX: POLYPECTOMY: SHX5525

## 2018-04-09 HISTORY — PX: COLONOSCOPY WITH PROPOFOL: SHX5780

## 2018-04-09 LAB — CBC
HCT: 44.7 % (ref 39.0–52.0)
HCT: 40 % (ref 39.0–52.0)
Hemoglobin: 13.8 g/dL (ref 13.0–17.0)
Hemoglobin: 12.6 g/dL — ABNORMAL LOW (ref 13.0–17.0)
MCH: 28.1 pg (ref 26.0–34.0)
MCH: 28.8 pg (ref 26.0–34.0)
MCHC: 30.9 g/dL (ref 30.0–36.0)
MCHC: 31.5 g/dL (ref 30.0–36.0)
MCV: 91 fL (ref 80.0–100.0)
MCV: 91.5 fL (ref 80.0–100.0)
NRBC: 0 % (ref 0.0–0.2)
Platelets: 327 10*3/uL (ref 150–400)
Platelets: 302 10*3/uL (ref 150–400)
RBC: 4.91 MIL/uL (ref 4.22–5.81)
RBC: 4.37 MIL/uL (ref 4.22–5.81)
RDW: 13.2 % (ref 11.5–15.5)
RDW: 13.2 % (ref 11.5–15.5)
WBC: 17.8 10*3/uL — ABNORMAL HIGH (ref 4.0–10.5)
WBC: 12.5 10*3/uL — ABNORMAL HIGH (ref 4.0–10.5)
nRBC: 0 % (ref 0.0–0.2)

## 2018-04-09 LAB — PROTIME-INR
INR: 1.47
Prothrombin Time: 17.6 seconds — ABNORMAL HIGH (ref 11.4–15.2)

## 2018-04-09 SURGERY — COLONOSCOPY WITH PROPOFOL
Anesthesia: Monitor Anesthesia Care

## 2018-04-09 MED ORDER — LIDOCAINE HCL (CARDIAC) PF 100 MG/5ML IV SOSY
PREFILLED_SYRINGE | INTRAVENOUS | Status: DC | PRN
  Administered 2018-04-09: 100 mg via INTRAVENOUS

## 2018-04-09 MED ORDER — HYDROMORPHONE HCL 1 MG/ML IJ SOLN
1.0000 mg | INTRAMUSCULAR | Status: DC | PRN
  Administered 2018-04-09: 1 mg via INTRAVENOUS
  Filled 2018-04-09: qty 1

## 2018-04-09 MED ORDER — PROMETHAZINE HCL 25 MG/ML IJ SOLN
INTRAMUSCULAR | Status: AC
  Filled 2018-04-09: qty 1

## 2018-04-09 MED ORDER — PROPOFOL 500 MG/50ML IV EMUL
INTRAVENOUS | Status: DC | PRN
  Administered 2018-04-09: 75 ug/kg/min via INTRAVENOUS

## 2018-04-09 MED ORDER — ONDANSETRON HCL 4 MG/2ML IJ SOLN
INTRAMUSCULAR | Status: DC | PRN
  Administered 2018-04-09: 4 mg via INTRAVENOUS

## 2018-04-09 MED ORDER — PROMETHAZINE HCL 25 MG/ML IJ SOLN
6.2500 mg | Freq: Four times a day (QID) | INTRAMUSCULAR | Status: AC | PRN
  Administered 2018-04-09: 6.25 mg via INTRAVENOUS

## 2018-04-09 MED ORDER — PROPOFOL 10 MG/ML IV BOLUS
INTRAVENOUS | Status: DC | PRN
  Administered 2018-04-09: 10 mg via INTRAVENOUS
  Administered 2018-04-09 (×2): 20 mg via INTRAVENOUS

## 2018-04-09 MED ORDER — LACTATED RINGERS IV SOLN
INTRAVENOUS | Status: DC | PRN
  Administered 2018-04-09: 08:00:00 via INTRAVENOUS

## 2018-04-09 MED ORDER — SODIUM CHLORIDE (PF) 0.9 % IJ SOLN
PREFILLED_SYRINGE | INTRAMUSCULAR | Status: DC | PRN
  Administered 2018-04-09: 4 mL

## 2018-04-09 SURGICAL SUPPLY — 22 items

## 2018-04-09 NOTE — Anesthesia Preprocedure Evaluation (Addendum)
Anesthesia Evaluation  Patient identified by MRN, date of birth, ID band Patient awake    Reviewed: Allergy & Precautions, NPO status , Patient's Chart, lab work & pertinent test results, reviewed documented beta blocker date and time   History of Anesthesia Complications Negative for: history of anesthetic complications  Airway Mallampati: II  TM Distance: >3 FB Neck ROM: Full    Dental  (+) Dental Advisory Given   Pulmonary former smoker (quit 11/19),    breath sounds clear to auscultation       Cardiovascular hypertension, Pt. on medications and Pt. on home beta blockers (-) angina+ CAD, + Past MI (11/19) and + Cardiac Stents (DES LAD 11/19)   Rhythm:Regular Rate:Normal  12/19 ECHO: EF 35-40%. Akinesis of the mid-apicalanteroseptal, anterior, inferoseptal, and apical myocardium   Neuro/Psych negative neurological ROS     GI/Hepatic Neg liver ROS, GERD  Controlled,Crohn's   Endo/Other  negative endocrine ROS  Renal/GU negative Renal ROS     Musculoskeletal   Abdominal   Peds  Hematology Coumadin: INR 1.47   Anesthesia Other Findings   Reproductive/Obstetrics                            Anesthesia Physical Anesthesia Plan  ASA: III  Anesthesia Plan: MAC   Post-op Pain Management:    Induction:   PONV Risk Score and Plan: 1 and Treatment may vary due to age or medical condition  Airway Management Planned: Natural Airway and Nasal Cannula  Additional Equipment:   Intra-op Plan:   Post-operative Plan:   Informed Consent: I have reviewed the patients History and Physical, chart, labs and discussed the procedure including the risks, benefits and alternatives for the proposed anesthesia with the patient or authorized representative who has indicated his/her understanding and acceptance.   Dental advisory given  Plan Discussed with: CRNA and Surgeon  Anesthesia Plan Comments:         Anesthesia Quick Evaluation

## 2018-04-09 NOTE — Anesthesia Postprocedure Evaluation (Signed)
Anesthesia Post Note  Patient: Jack [name]  Procedure(s) Performed: COLONOSCOPY WITH PROPOFOL Hot Biopsy (N/A ) SUBMUCOSAL INJECTION HEMOSTASIS CLIP PLACEMENT POLYPECTOMY     Patient location during evaluation: Endoscopy Anesthesia Type: MAC Level of consciousness: awake and alert, patient cooperative and oriented Pain management: pain level controlled Vital Signs Assessment: post-procedure vital signs reviewed and stable Respiratory status: spontaneous breathing, nonlabored ventilation and respiratory function stable Cardiovascular status: blood pressure returned to baseline and stable : nausea improving. Anesthetic complications: no    Last Vitals:  Vitals:   04/09/18 0935 04/09/18 0945  BP: (!) 164/92 (!) 174/101  Pulse: 69 64  Resp: 19 12  Temp:    SpO2: 92% 100%    Last Pain:  Vitals:   04/09/18 1049  TempSrc:   PainSc: 7                  Adore Kithcart,E. Adrienna Karis

## 2018-04-09 NOTE — Progress Notes (Signed)
Case discussed at length with Dr [name] who performed the patient's colonoscopy this morning.  I agree with his recommendations regarding antiplatelet therapy and anticoagulation.  I discussed this with the patient and his wife.  Plan resume clopidogrel 75 mg daily tomorrow as long as no significant bleeding.  He also recommended a trial of IV heparin during hospital observation rather than just starting the patient on warfarin and allowing his INR to trend upward.  I suspect if he tolerates heparin for 24 hours he should be fine to start back on warfarin.  Please call if any other cardiac questions.  Thank you  Sherren Mocha MD 04/09/2018 2:07 PM

## 2018-04-09 NOTE — Transfer of Care (Signed)
Immediate Anesthesia Transfer of Care Note  Patient: Jack [name]  Procedure(s) Performed: COLONOSCOPY WITH PROPOFOL (N/A )  Patient Location: Endoscopy Unit  Anesthesia Type:MAC  Level of Consciousness: awake and alert   Airway & Oxygen Therapy: Patient Spontanous Breathing and Patient connected to nasal cannula oxygen  Post-op Assessment: Report given to RN, Post -op Vital signs reviewed and stable and Patient moving all extremities  Post vital signs: Reviewed and stable  Last Vitals:  Vitals Value Taken Time  BP 159/79 04/09/2018  9:24 AM  Temp    Pulse 65 04/09/2018  9:26 AM  Resp 16 04/09/2018  9:26 AM  SpO2 100 % 04/09/2018  9:26 AM  Vitals shown include unvalidated device data.  Last Pain:  Vitals:   04/09/18 0735  TempSrc: Oral  PainSc: 0-No pain         Complications: No apparent anesthesia complications

## 2018-04-09 NOTE — Interval H&P Note (Signed)
History and Physical Interval Note:  04/09/2018 8:14 AM  Jack [name]  has presented today for surgery, with the diagnosis of Bloody diarrhea, history of Crohn's ileocolitis.  The various methods of treatment have been discussed with the patient and family. After consideration of risks, benefits and other options for treatment, the patient has consented to  Procedure(s): COLONOSCOPY WITH PROPOFOL (N/A) as a surgical intervention .  The patient's history has been reviewed, patient examined, no change in status, stable for surgery.  I have reviewed the patient's chart and labs.  Questions were answered to the patient's satisfaction.    INR 1.5 today Nelida Meuse III

## 2018-04-09 NOTE — Progress Notes (Signed)
PROGRESS NOTE    Custer Pimenta Schank  VPX:[id number] DOB: 03-04-1966 DOA: 04/07/2018 PCP: [name], MD  Brief Narrative: 53 y.o. male with medical history significant for recent STEMI s/p LAD PCI in 01/2018, ischemic cardiomyopathy, EF 30%, LV thrombus on coumadin, pericarditis, crohn disease on biologics and prednisone, chronic back pain s/p Spinal stimulator presented to ED with 6-7 episodes of hematochezia, in the emergency room found to have a hemoglobin hemoglobin of 13, INR was 2.4.  Coumadin and Plavix were held on admission  Assessment & Plan:   Hematochezia -In the background of Plavix, Coumadin use and history of Crohn's disease -No further bleeding, Coumadin and Plavix were held on admission, anticoagulation was not reversed as bleeding appeared to be subsided -Gastroenterology and cardiology following, further bleeding, hemoglobin is stable  -Colonoscopy was unremarkable, a rectal polyp removed and site clipped -After extensive discussion with GI and cardiology plan at this time is to potentially restart Plavix tomorrow, and perhaps heparin or warfarin within 24 to 48 hours  -History of Crohn's disease, no symptoms of active flare   LV thrombus -In the background of ischemic cardiomyopathy and recent MI -Coumadin held but not reversed, INR is 1.47, fortunately recent echo did not indicate any residual thrombus -Cardiology following, see discussion regarding anticoagulation above  History of recent STEMI -LAD stenosis status post PCI and drug-eluting stent in 11/201 9 -On Plavix at baseline, held as above  Ischemic cardiomyopathy Secondary to STEMI. Current EF of 35-40% from Transthoracic Echocardiogram on 12/27.  -Currently on lasix as needed and spironolactone -Clinically appears euvolemic, hold diuretics today  Essential hypertension Patient is on Coreg, losartan and spironolactone as an outpatient. -Hold Coreg, losartan, spironolactone in setting of  hematochezia  History of Crohn's disease -On prednisone for many years and Biologics every 8 weeks, last dose was day of admission -Continue home regimen of prednisone  BPH -Continue Flomax  Pericarditis post MI -Continue colchicine  CKD stage III Stable.  Hyperlipidemia -Continue Lipitor  GERD -Continue Protonix  DVT prophylaxis: SCDs Code Status: Full code Family Communication:  Spouse at bedside Disposition Plan:  Home in 48 hours  Consultants:  Gastroenterology Cardiology   Procedures:   Antimicrobials:    Subjective: -Just back from colonoscopy, feels lousy, vomited x1  Objective: Vitals:   04/09/18 0735 04/09/18 0924 04/09/18 0935 04/09/18 0945  BP: (!) 153/87 (!) 159/79 (!) 164/92 (!) 174/101  Pulse: (!) 56 66 69 64  Resp: 17 16 19 12   Temp: 97.9 F (36.6 C)     TempSrc: Oral     SpO2: 100% 100% 92% 100%  Weight:      Height:        Intake/Output Summary (Last 24 hours) at 04/09/2018 1447 Last data filed at 04/09/2018 1100 Gross per 24 hour  Intake 1800 ml  Output -  Net 1800 ml   Filed Weights   04/07/18 1857  Weight: 74.8 kg    Examination:  Gen: Awake, Alert, Oriented X 3, comfortable, sitting up in bed, HEENT: PERRLA, Neck supple, no JVD Lungs: Clear bilaterally CVS: RRR,No Gallops,Rubs or new Murmurs Abd: soft, Non tender, non distended, BS present Extremities: No edema Skin: no new rashes Psychiatry: Judgement and insight appear normal. Mood & affect appropriate.     Data Reviewed:   CBC: Recent Labs  Lab 04/07/18 1209 04/07/18 1533 04/07/18 2111 04/08/18 0310 04/09/18 0425  WBC 14.7* 13.3* 12.4* 11.7* 17.8*  HGB 13.0 13.7 12.5* 12.8* 13.8  HCT 39.5 45.3 40.5 40.0  44.7  MCV 88 92.6 90.8 91.5 91.0  PLT 284 291 262 272 507   Basic Metabolic Panel: Recent Labs  Lab 04/07/18 1533 04/08/18 0310  NA 135 139  K 4.7 4.2  CL 106 105  CO2 21* 27  GLUCOSE 115* 89  BUN 29* 20  CREATININE 1.27* 1.31*   CALCIUM 9.0 8.3*   GFR: Estimated Creatinine Clearance: 62 mL/min (A) (by C-G formula based on SCr of 1.31 mg/dL (H)). Liver Function Tests: Recent Labs  Lab 04/07/18 1533  AST 25  ALT 32  ALKPHOS 39  BILITOT 1.0  PROT 6.5  ALBUMIN 3.8   No results for input(s): LIPASE, AMYLASE in the last 168 hours. No results for input(s): AMMONIA in the last 168 hours. Coagulation Profile: Recent Labs  Lab 04/07/18 1158 04/07/18 1533 04/08/18 0310 04/09/18 0425  INR 2.4 2.20 1.97 1.47   Cardiac Enzymes: No results for input(s): CKTOTAL, CKMB, CKMBINDEX, TROPONINI in the last 168 hours. BNP (last 3 results) Recent Labs    03/18/18 1524 03/22/18 1140  PROBNP 2,934* 2,573*   HbA1C: No results for input(s): HGBA1C in the last 72 hours. CBG: No results for input(s): GLUCAP in the last 168 hours. Lipid Profile: No results for input(s): CHOL, HDL, LDLCALC, TRIG, CHOLHDL, LDLDIRECT in the last 72 hours. Thyroid Function Tests: No results for input(s): TSH, T4TOTAL, FREET4, T3FREE, THYROIDAB in the last 72 hours. Anemia Panel: No results for input(s): VITAMINB12, FOLATE, FERRITIN, TIBC, IRON, RETICCTPCT in the last 72 hours. Urine analysis: No results found for: COLORURINE, APPEARANCEUR, LABSPEC, PHURINE, GLUCOSEU, HGBUR, BILIRUBINUR, KETONESUR, PROTEINUR, UROBILINOGEN, NITRITE, LEUKOCYTESUR Sepsis Labs: @LABRCNTIP (procalcitonin:4,lacticidven:4)  )No results found for this or any previous visit (from the past 240 hour(s)).       Radiology Studies: No results found.      Scheduled Meds: . atorvastatin  80 mg Oral q1800  . carvedilol  3.125 mg Oral BID WC  . colchicine  0.6 mg Oral Daily  . gabapentin  300 mg Oral q morning - 10a  . gabapentin  600 mg Oral QPM  . hydrOXYzine  100 mg Oral QHS  . pantoprazole  40 mg Oral Daily  . predniSONE  17 mg Oral Q breakfast  . tamsulosin  0.4 mg Oral Daily   Continuous Infusions:   LOS: 0 days    Time spent:  62mn    PDomenic Polite MD [facility] Page via [email or web address], password [email or web address] After 7PM please contact night-coverage  04/09/2018, 2:47 PM

## 2018-04-09 NOTE — Op Note (Addendum)
[facility] Patient Name: Jack [name] Procedure Date : 04/09/2018 MRN: [id number] Attending MD: [name]. [name] , MD Date of Birth: 04/02/1965 CSN: [id number] Age: 53 Admit Type: Inpatient Procedure:                Colonoscopy Indications:              Hematochezia, , Acute post hemorrhagic anemia,                            Crohn's disease of the colon on vedolizumab also                            low-dose prednisone for eosinophilic pneumonitis                            (see inpatient consult for details of complex                            cardiac history. Recent MI requiring DES and DAPT                            with concomitant decreased LVEF requiring [place]) Providers:                [name]. [name], MD, Angus Seller, Janie Billups,                            Technician, Raphael Gibney, CRNA Referring MD:             [facility] Medicines:                Monitored Anesthesia Care Complications:            No immediate complications. Estimated Blood Loss:     Estimated blood loss was minimal. Procedure:                Pre-Anesthesia Assessment:                           - Prior to the procedure, a History and Physical                            was performed, and patient medications and                            allergies were reviewed. The patient's tolerance of                            previous anesthesia was also reviewed. The risks                            and benefits of the procedure and the sedation                            options and risks were discussed with the patient.  All questions were answered, and informed consent                            was obtained. Prior Anticoagulants: The patient                            last took Coumadin (warfarin) 2 days and Plavix                            (clopidogrel) 2 days prior to the procedure. ASA                            Grade Assessment: IV - A patient with  severe                            systemic disease that is a constant threat to life.                            After reviewing the risks and benefits, the patient                            was deemed in satisfactory condition to undergo the                            procedure.                           After obtaining informed consent, the colonoscope                            was passed under direct vision. Throughout the                            procedure, the patient's blood pressure, pulse, and                            oxygen saturations were monitored continuously. The                            CF-HQ190L ([id number]) Olympus colonoscope was                            introduced through the anus and advanced to the the                            terminal ileum, with identification of the                            appendiceal orifice and IC valve. The colonoscopy                            was performed without difficulty. The patient  tolerated the procedure well. The quality of the                            bowel preparation was good. The terminal ileum,                            appendiceal orifice, and rectum were photographed.                            The bowel preparation used was GoLYTELY. Scope In: 8:36:26 AM Scope Out: 9:15:20 AM Scope Withdrawal Time: 0 hours 31 minutes 34 seconds  Total Procedure Duration: 0 hours 38 minutes 54 seconds  Findings:      The perianal and digital rectal examinations were normal.      The terminal ileum appeared normal.      Normal mucosa was found in the entire colon. Specifically, there was no       active colitis.      A few small pseudopolyps were found in the descending colon and in the       transverse colon. One of them had an erosion on the tip, though with no       stigmata of bleeding.      A 5 mm frond-like polyp was found in the distal rectum, 5 to 7 cm       proximal to the anal verge in the  left lateral wall. The polyp was       sessile and intermittently oozing. Area was successfully injected with 4       mL of a 1:10,000 solution of epinephrine for hemostasis (four-quadrant       fashion, excellent tissue blanching). The polyp was removed with a hot       biopsy forceps. Resection and retrieval were complete, followed by       oozing of blood. For hemostasis, one hemostatic clip was successfully       placed (MR conditional). There was no bleeding at the end of the       procedure.      Retroflexion in the rectum was not performed.      The exam was otherwise without abnormality. Impression:               - The examined portion of the ileum was normal.                           - Normal mucosa in the entire examined colon.                           - Pseudopolyps in the descending colon and in the                            transverse colon.                           - One 5 mm polyp in the rectum, removed with a hot                            biopsy forceps. Resected and retrieved. Injected.  Clip (MR conditional) was placed.                           - The examination was otherwise normal. Recommendation:           - Return patient to hospital ward for ongoing care.                           - Resume regular diet.                           - Continue present medications.                           - Await pathology results.                           - No recommendation at this time regarding repeat                            colonoscopy.                           - Do not resume Plavix or Coumadin until patient is                            seen by GI consultant tomorrow. If there is no                            bleeding at that time, we will most likely                            recommend resuming Plavix. Since 03/26/2018                            echocardiogram showed no LV thrombus, it would be                            preferable to hold  anticoagulation the day after                            tomorrow. At that time, consideration should be                            given to starting unfractionated heparin for a day                            prior to discharge to make sure there is no                            recurrent bleeding prior to resuming Coumadin. Procedure Code(s):        --- Professional ---                           [phone], Colonoscopy, flexible; with removal of  tumor(s), polyp(s), or other lesion(s) by hot                            biopsy forceps                           [id number], 67, Colonoscopy, flexible; with directed                            submucosal injection(s), any substance Diagnosis Code(s):        --- Professional ---                           K51.40, Inflammatory polyps of colon without                            complications                           K62.1, Rectal polyp                           K92.1, Melena (includes Hematochezia)                           D62, Acute posthemorrhagic anemia                           K50.10, Crohn's disease of large intestine without                            complications CPT copyright 2018 [employer]. All rights reserved. The codes documented in this report are preliminary and upon coder review may  be revised to meet current compliance requirements. Henry L. [name], MD 04/09/2018 9:39:23 AM This report has been signed electronically. Number of Addenda: 0

## 2018-04-10 DIAGNOSIS — K921 Melena: Secondary | ICD-10-CM | POA: Diagnosis not present

## 2018-04-10 DIAGNOSIS — K51411 Inflammatory polyps of colon with rectal bleeding: Secondary | ICD-10-CM | POA: Diagnosis not present

## 2018-04-10 LAB — CBC
HCT: 35.7 % — ABNORMAL LOW (ref 39.0–52.0)
HEMOGLOBIN: 11.3 g/dL — AB (ref 13.0–17.0)
MCH: 28.2 pg (ref 26.0–34.0)
MCHC: 31.7 g/dL (ref 30.0–36.0)
MCV: 89 fL (ref 80.0–100.0)
Platelets: 246 10*3/uL (ref 150–400)
RBC: 4.01 MIL/uL — ABNORMAL LOW (ref 4.22–5.81)
RDW: 13 % (ref 11.5–15.5)
WBC: 11.8 10*3/uL — ABNORMAL HIGH (ref 4.0–10.5)
nRBC: 0 % (ref 0.0–0.2)

## 2018-04-10 LAB — BASIC METABOLIC PANEL
Anion gap: 8 (ref 5–15)
BUN: 18 mg/dL (ref 6–20)
CHLORIDE: 106 mmol/L (ref 98–111)
CO2: 24 mmol/L (ref 22–32)
Calcium: 8 mg/dL — ABNORMAL LOW (ref 8.9–10.3)
Creatinine, Ser: 1.44 mg/dL — ABNORMAL HIGH (ref 0.61–1.24)
GFR calc Af Amer: >60 mL/min (ref >60)
GFR calc non Af Amer: 56 mL/min — ABNORMAL LOW (ref >60)
Glucose, Bld: 109 mg/dL — ABNORMAL HIGH (ref 70–99)
Potassium: 3.4 mmol/L — ABNORMAL LOW (ref 3.5–5.1)
Sodium: 138 mmol/L (ref 135–145)

## 2018-04-10 LAB — PROTIME-INR
INR: 1.36
Prothrombin Time: 16.6 seconds — ABNORMAL HIGH (ref 11.4–15.2)

## 2018-04-10 MED ORDER — SENNOSIDES-DOCUSATE SODIUM 8.6-50 MG PO TABS
1.0000 | ORAL_TABLET | Freq: Two times a day (BID) | ORAL | Status: DC | PRN
  Administered 2018-04-10: 1 via ORAL
  Filled 2018-04-10: qty 1

## 2018-04-10 MED ORDER — POTASSIUM CHLORIDE CRYS ER 20 MEQ PO TBCR
40.0000 meq | EXTENDED_RELEASE_TABLET | Freq: Once | ORAL | Status: AC
  Administered 2018-04-10: 40 meq via ORAL
  Filled 2018-04-10: qty 2

## 2018-04-10 MED ORDER — CLOPIDOGREL BISULFATE 75 MG PO TABS
75.0000 mg | ORAL_TABLET | Freq: Every day | ORAL | Status: DC
  Administered 2018-04-10 – 2018-04-12 (×3): 75 mg via ORAL
  Filled 2018-04-10 (×3): qty 1

## 2018-04-10 MED ORDER — POLYETHYLENE GLYCOL 3350 17 G PO PACK
17.0000 g | PACK | Freq: Every day | ORAL | Status: DC
  Administered 2018-04-10 – 2018-04-11 (×2): 17 g via ORAL
  Filled 2018-04-10 (×2): qty 1

## 2018-04-10 NOTE — Progress Notes (Signed)
PROGRESS NOTE    Jack [name] Null  FKC:[id number] DOB: 17-Jan-1966 DOA: 04/07/2018 PCP: [name], MD  Brief Narrative: 53 y.o. male with medical history significant for recent STEMI s/p LAD PCI in 01/2018, ischemic cardiomyopathy, EF 30%, LV thrombus on coumadin, pericarditis, crohn disease on biologics and prednisone, chronic back pain s/p Spinal stimulator presented to ED with 6-7 episodes of hematochezia, in the emergency room found to have a hemoglobin hemoglobin of 13, INR was 2.4.  Coumadin and Plavix were held on admission. -Colonoscopy 1/10 with rectal polyp, status post polypectomy clipping  Assessment & Plan:   Hematochezia -In the background of Plavix, Coumadin use and history of Crohn's disease -No further bleeding, Coumadin and Plavix were held on admission, anticoagulation was not reversed as bleeding appeared to be subsided -Gastroenterology and cardiology following, further bleeding, hemoglobin is stable  -Colonoscopy was unremarkable, a rectal polyp removed and site clipped -Discussed with cardiology and GI, will restart Plavix today and if no further bleeding full dose heparin without bolus and Coumadin tomorrow -Hemoglobin has drifted down a bit but no overt bleeding last night  LV thrombus -In the background of ischemic cardiomyopathy and recent MI -Coumadin on hold, see above,  fortunately recent echo did not indicate any residual thrombus -Cardiology following, see discussion regarding anticoagulation above  History of recent STEMI -LAD stenosis status post PCI and drug-eluting stent in 11/201 9 -On Plavix at baseline, held as above  Ischemic cardiomyopathy Secondary to STEMI. Current EF of 35-40% from Transthoracic Echocardiogram on 12/27.  -Currently on lasix as needed and spironolactone -Clinically appears euvolemic, hold diuretics  Essential hypertension Patient is on Coreg, losartan and spironolactone as an outpatient. -Continue Coreg, holding  losartan, spironolactone in setting of hematochezia  History of Crohn's disease -On prednisone for many years and Biologics every 8 weeks, last dose was day of admission -Continue home regimen of prednisone  BPH -Continue Flomax  Pericarditis post MI -Continue colchicine  CKD stage III Stable.  Hyperlipidemia -Continue Lipitor  GERD -Continue Protonix  DVT prophylaxis: SCDs Code Status: Full code Family Communication:  Spouse at bedside Disposition Plan:  Home in 48 hours  Consultants:  Gastroenterology Cardiology   Procedures: Colonoscopy 1/10  Antimicrobials:    Subjective: -Feels better, back to baseline, no further bleeding, ready to restart antiplatelet -No neurological symptoms  Objective: Vitals:   04/09/18 0935 04/09/18 0945 04/09/18 2242 04/10/18 0825  BP: (!) 164/92 (!) 174/101 105/65 126/86  Pulse: 69 64 64 63  Resp: 19 12 16 10   Temp:   97.8 F (36.6 C) 98.1 F (36.7 C)  TempSrc:   Oral Oral  SpO2: 92% 100%  99%  Weight:      Height:       No intake or output data in the 24 hours ending 04/10/18 1311 Filed Weights   04/07/18 1857  Weight: 74.8 kg    Examination:  Gen: Awake, Alert, Oriented X 3, no distress, sitting up in bed, HEENT: PERRLA, Neck supple, no JVD Lungs: Clear bilaterally CVS: 1 S2/regular rate rhythm Abd: soft, Non tender, non distended, BS present Extremities: No Cyanosis, Clubbing or edema Skin: no new rashes Psychiatry: Judgement and insight appear normal. Mood & affect appropriate.     Data Reviewed:   CBC: Recent Labs  Lab 04/07/18 2111 04/08/18 0310 04/09/18 0425 04/09/18 1804 04/10/18 0235  WBC 12.4* 11.7* 17.8* 12.5* 11.8*  HGB 12.5* 12.8* 13.8 12.6* 11.3*  HCT 40.5 40.0 44.7 40.0 35.7*  MCV 90.8 91.5 91.0  91.5 89.0  PLT 262 272 327 302 478   Basic Metabolic Panel: Recent Labs  Lab 04/07/18 1533 04/08/18 0310 04/10/18 0235  NA 135 139 138  K 4.7 4.2 3.4*  CL 106 105 106  CO2 21*  27 24  GLUCOSE 115* 89 109*  BUN 29* 20 18  CREATININE 1.27* 1.31* 1.44*  CALCIUM 9.0 8.3* 8.0*   GFR: Estimated Creatinine Clearance: 56.4 mL/min (A) (by C-G formula based on SCr of 1.44 mg/dL (H)). Liver Function Tests: Recent Labs  Lab 04/07/18 1533  AST 25  ALT 32  ALKPHOS 39  BILITOT 1.0  PROT 6.5  ALBUMIN 3.8   No results for input(s): LIPASE, AMYLASE in the last 168 hours. No results for input(s): AMMONIA in the last 168 hours. Coagulation Profile: Recent Labs  Lab 04/07/18 1158 04/07/18 1533 04/08/18 0310 04/09/18 0425 04/10/18 0235  INR 2.4 2.20 1.97 1.47 1.36   Cardiac Enzymes: No results for input(s): CKTOTAL, CKMB, CKMBINDEX, TROPONINI in the last 168 hours. BNP (last 3 results) Recent Labs    03/18/18 1524 03/22/18 1140  PROBNP 2,934* 2,573*   HbA1C: No results for input(s): HGBA1C in the last 72 hours. CBG: No results for input(s): GLUCAP in the last 168 hours. Lipid Profile: No results for input(s): CHOL, HDL, LDLCALC, TRIG, CHOLHDL, LDLDIRECT in the last 72 hours. Thyroid Function Tests: No results for input(s): TSH, T4TOTAL, FREET4, T3FREE, THYROIDAB in the last 72 hours. Anemia Panel: No results for input(s): VITAMINB12, FOLATE, FERRITIN, TIBC, IRON, RETICCTPCT in the last 72 hours. Urine analysis: No results found for: COLORURINE, APPEARANCEUR, LABSPEC, PHURINE, GLUCOSEU, HGBUR, BILIRUBINUR, KETONESUR, PROTEINUR, UROBILINOGEN, NITRITE, LEUKOCYTESUR Sepsis Labs: @LABRCNTIP (procalcitonin:4,lacticidven:4)  )No results found for this or any previous visit (from the past 240 hour(s)).       Radiology Studies: No results found.      Scheduled Meds: . atorvastatin  80 mg Oral q1800  . carvedilol  3.125 mg Oral BID WC  . clopidogrel  75 mg Oral Daily  . colchicine  0.6 mg Oral Daily  . gabapentin  300 mg Oral q morning - 10a  . gabapentin  600 mg Oral QPM  . hydrOXYzine  100 mg Oral QHS  . pantoprazole  40 mg Oral Daily  .  polyethylene glycol  17 g Oral Daily  . predniSONE  17 mg Oral Q breakfast  . tamsulosin  0.4 mg Oral Daily   Continuous Infusions:   LOS: 0 days    Time spent: 62mn    PDomenic Polite MD [facility] Page via [email or web address], password [email or web address] After 7PM please contact night-coverage  04/10/2018, 1:11 PM

## 2018-04-10 NOTE — Progress Notes (Addendum)
   Patient Name: Jack [name] Date of Encounter: 04/10/2018, 7:41 AM    Subjective  No stools, no bleeding overnight    Objective  BP 105/65 (BP Location: Left Arm)   Pulse 64   Temp 97.8 F (36.6 C) (Oral)   Resp 16   Ht 5' 6.5" (1.689 m)   Wt 74.8 kg   SpO2 100%   BMI 26.23 kg/m  NAD  CBC Latest Ref Rng & Units 04/10/2018 04/09/2018 04/09/2018  WBC 4.0 - 10.5 K/uL 11.8(H) 12.5(H) 17.8(H)  Hemoglobin 13.0 - 17.0 g/dL 11.3(L) 12.6(L) 13.8  Hematocrit 39.0 - 52.0 % 35.7(L) 40.0 44.7  Platelets 150 - 400 K/uL 246 302 327       Assessment and Plan  GI bleed from rectal polyp in setting of clopidogrel and warfarin, Crohn's  Hgb has declined some but no more bleeding  Resume clopidogrel (I ordered) Heparin x 24 hrs tomorrow and I suppose could also give a dose of warfarin anticipating he will be ok (if he bleeds with heparin do not think 1 day of warfarin would be a problem)    Gatha Mayer, MD, [facility] 04/10/2018 7:41 AM Pager [phone]

## 2018-04-11 ENCOUNTER — Other Ambulatory Visit: Payer: Self-pay | Admitting: Interventional Cardiology

## 2018-04-11 ENCOUNTER — Encounter (HOSPITAL_COMMUNITY): Payer: Self-pay | Admitting: Gastroenterology

## 2018-04-11 DIAGNOSIS — K921 Melena: Secondary | ICD-10-CM | POA: Diagnosis not present

## 2018-04-11 DIAGNOSIS — K51411 Inflammatory polyps of colon with rectal bleeding: Secondary | ICD-10-CM | POA: Diagnosis not present

## 2018-04-11 LAB — CBC
HEMATOCRIT: 35.2 % — AB (ref 39.0–52.0)
Hemoglobin: 11.4 g/dL — ABNORMAL LOW (ref 13.0–17.0)
MCH: 29.4 pg (ref 26.0–34.0)
MCHC: 32.4 g/dL (ref 30.0–36.0)
MCV: 90.7 fL (ref 80.0–100.0)
Platelets: 258 10*3/uL (ref 150–400)
RBC: 3.88 MIL/uL — ABNORMAL LOW (ref 4.22–5.81)
RDW: 13.2 % (ref 11.5–15.5)
WBC: 12.9 10*3/uL — ABNORMAL HIGH (ref 4.0–10.5)
nRBC: 0 % (ref 0.0–0.2)

## 2018-04-11 LAB — PROTIME-INR
INR: 1.09
Prothrombin Time: 14 seconds (ref 11.4–15.2)

## 2018-04-11 LAB — HEPARIN LEVEL (UNFRACTIONATED)
Heparin Unfractionated: 0.18 IU/mL — ABNORMAL LOW (ref 0.30–0.70)
Heparin Unfractionated: 0.37 IU/mL (ref 0.30–0.70)

## 2018-04-11 MED ORDER — WARFARIN - PHARMACIST DOSING INPATIENT: Freq: Every day | Status: DC

## 2018-04-11 MED ORDER — HEPARIN (PORCINE) 25000 UT/250ML-% IV SOLN
900.0000 [IU]/h | INTRAVENOUS | Status: DC
  Administered 2018-04-11: 1000 [IU]/h via INTRAVENOUS
  Administered 2018-04-12: 950 [IU]/h via INTRAVENOUS
  Filled 2018-04-11 (×2): qty 250

## 2018-04-11 MED ORDER — WARFARIN SODIUM 5 MG PO TABS
5.0000 mg | ORAL_TABLET | Freq: Once | ORAL | Status: AC
  Administered 2018-04-11: 5 mg via ORAL
  Filled 2018-04-11: qty 1

## 2018-04-11 NOTE — Progress Notes (Signed)
ANTICOAGULATION CONSULT NOTE - Initial Consult  Pharmacy Consult for warfarin + heparin Indication: LV Thrombus  Allergies  Allergen Reactions  . Cephalexin Nausea And Vomiting    Other Reaction: GI UPSET  . Duloxetine Other (See Comments)    Headaches     Patient Measurements: Height: 5' 6.5" (168.9 cm) Weight: 165 lb (74.8 kg) IBW/kg (Calculated) : 64.95 Heparin Dosing Weight: 74.8kg  Vital Signs: Temp: 98 F (36.7 C) (01/12 0733) Temp Source: Oral (01/12 0733) BP: 120/85 (01/12 0733) Pulse Rate: 70 (01/12 0910)  Labs: Recent Labs    04/09/18 0425 04/09/18 1804 04/10/18 0235 04/11/18 0317  HGB 13.8 12.6* 11.3* 11.4*  HCT 44.7 40.0 35.7* 35.2*  PLT 327 302 246 258  LABPROT 17.6*  --  16.6* 14.0  INR 1.47  --  1.36 1.09  CREATININE  --   --  1.44*  --     Estimated Creatinine Clearance: 56.4 mL/min (A) (by C-G formula based on SCr of 1.44 mg/dL (H)).   Medical History: Past Medical History:  Diagnosis Date  . BPH (benign prostatic hypertrophy)   . CAD (coronary artery disease)    a. anterior STEMI 01/2018 -  proximal occlusion of LAD, treated with DES. Cath also showed 20% distal LM, 95% ostial-prox small-moderate ramus, 70-80% ostial Cx, 70% dominant ostial OM, 50-60% prox Cx, 70% RCA. EF 35% by cath with LVEDP 61mHg. Med rx for residual disease. Course complicated by post MI pericarditis and LV thrombus.  . Chronic systolic [place]congestive) heart failure ([place]   . Colitis   . Colon polyp    inflammatory  . Crohn disease ([place] 1992   history uveitis, involvement of intestines and lungs  . Diverticulosis   . Eosinophilic granuloma ([place]   . Essential hypertension   . GERD (gastroesophageal reflux disease)   . History of chicken pox   . History of gastroesophageal reflux (GERD)   . History of seizure 1995   grand mal x1, completed 6 yrs dilantin. no seizures since  . Hyperlipidemia   . Internal hemorrhoids   . Ischemic cardiomyopathy   . LV (left  ventricular) mural thrombus following [place] ([place] 01[place]  . Osteoporosis 11/2015   DEXA T -2.9  . Schwannoma 2007   L axilla s/p surgery  . Seizures ([place]    last seizure 1995 and only x 1 seizure-   . Ulnar neuropathy    h/o this from L arm schwannoma     Assessment: [id number] with hx of LV thrombus on warfarin PTA which has been held for rectal bleeding, now s/p colonoscopy and rectal polyp clipping and pharmacy consulted to resume warfarin with heparin bridge.  H/H low but stable post-op, plts 258.   Last wafarin dose 1/7, INR 1.09 today PTA wafarin dosing: 534mdaily except 2.24m124mn Tue/Thur/Sat  Goal of Therapy:  Heparin level 0.3-0.5 units/ml Monitor platelets by anticoagulation protocol: Yes   Plan:  Heparin gtt at 1000 units/hr, no bolus Warfarin 24mg48m x 1 tonight F/u 6 hour heparin level Daily INR, heparin level, CBC, s/s bleeding  JonaBertis RuddyarmD Clinical Pharmacist Please check AMION for all [facility] 04/11/2018 10:00 AM

## 2018-04-11 NOTE — Progress Notes (Addendum)
PROGRESS NOTE    Jack [name]  NID:[id number] DOB: Nov 12, 1965 DOA: 04/07/2018 PCP: [name], MD  Brief Narrative: 53 y.o. male with medical history significant for recent STEMI s/p LAD PCI in 01/2018, ischemic cardiomyopathy, EF 30%, LV thrombus on coumadin, pericarditis, crohn disease on biologics and prednisone, chronic back pain s/p Spinal stimulator presented to ED with 6-7 episodes of hematochezia, in the emergency room found to have a hemoglobin hemoglobin of 13, INR was 2.4.  Coumadin and Plavix were held on admission. -Colonoscopy 1/10 with rectal polyp, status post polypectomy clipping -Remained stable, no further bleeding, restarted Plavix 1/11  Assessment & Plan:   Hematochezia -Due to rectal polyp, possibly inflammatory in the background of Plavix, Coumadin use and history of Crohn's disease -No further bleeding, Coumadin and Plavix were held on admission -GI and cardiology following, hemoglobin is stable -Underwent colonoscopy 1/10 which noted a rectal polyp this was removed -After extensive discussions with GI and cardiology Plavix restarted yesterday, start heparin without bolus today and Coumadin tonight -will need INR follow-up on Wednesday 1/15 with [facility]  LV thrombus -In the background of ischemic cardiomyopathy and recent MI -Coumadin on hold, see above,  fortunately recent echo did not indicate any residual thrombus -Cardiology following, see discussion regarding anticoagulation above  History of recent STEMI -LAD stenosis status post PCI and drug-eluting stent in 11/201 9 -On Plavix at baseline, held as above  Ischemic cardiomyopathy Secondary to STEMI. Current EF of 35-40% from Transthoracic Echocardiogram on 12/27.  -Currently on lasix as needed and spironolactone -Clinically appears euvolemic, holding diuretics  Essential hypertension Patient is on Coreg, losartan and spironolactone as an outpatient. -Continue Coreg, holding  losartan, spironolactone in setting of hematochezia  History of Crohn's disease -On prednisone for many years and Biologics every 8 weeks, last dose was day of admission -Continue home regimen of prednisone  BPH -Continue Flomax  Pericarditis post MI -Continue colchicine  CKD stage III Stable.  Hyperlipidemia -Continue Lipitor  GERD -Continue Protonix  DVT prophylaxis: SCDs Code Status: Full code Family Communication:  Spouse at bedside Disposition Plan:  Home when stable without further bleeding Admission status: -Due to recurrent bleeding on observation we will change patient to inpatient  Consultants:  Gastroenterology Cardiology   Procedures: Colonoscopy 1/10  Antimicrobials:    Subjective: -mild recurrent bleeding, no further bowel movements, feels well  Objective: Vitals:   04/10/18 1620 04/10/18 2108 04/11/18 0733 04/11/18 0910  BP: 134/75 124/74 120/85   Pulse: 71 69 (!) 59 70  Resp: 13 19    Temp: 98.5 F (36.9 C) 98.5 F (36.9 C) 98 F (36.7 C)   TempSrc: Oral Oral Oral   SpO2: 100% 99% 97%   Weight:      Height:       No intake or output data in the 24 hours ending 04/11/18 1131 Filed Weights   04/07/18 1857  Weight: 74.8 kg    Examination:  Gen: Awake, Alert, Oriented X 3, no distress, sitting up in bed HEENT: PERRLA, Neck supple, no JVD Lungs: Clear bilaterally CVS: RRR,No Gallops,Rubs or new Murmurs Abd: soft, Non tender, non distended, BS present Extremities: No edema Skin: no new rashes Psychiatry: Judgement and insight appear normal. Mood & affect appropriate.     Data Reviewed:   CBC: Recent Labs  Lab 04/08/18 0310 04/09/18 0425 04/09/18 1804 04/10/18 0235 04/11/18 0317  WBC 11.7* 17.8* 12.5* 11.8* 12.9*  HGB 12.8* 13.8 12.6* 11.3* 11.4*  HCT 40.0 44.7 40.0  35.7* 35.2*  MCV 91.5 91.0 91.5 89.0 90.7  PLT 272 327 302 246 161   Basic Metabolic Panel: Recent Labs  Lab 04/07/18 1533 04/08/18 0310  04/10/18 0235  NA 135 139 138  K 4.7 4.2 3.4*  CL 106 105 106  CO2 21* 27 24  GLUCOSE 115* 89 109*  BUN 29* 20 18  CREATININE 1.27* 1.31* 1.44*  CALCIUM 9.0 8.3* 8.0*   GFR: Estimated Creatinine Clearance: 56.4 mL/min (A) (by C-G formula based on SCr of 1.44 mg/dL (H)). Liver Function Tests: Recent Labs  Lab 04/07/18 1533  AST 25  ALT 32  ALKPHOS 39  BILITOT 1.0  PROT 6.5  ALBUMIN 3.8   No results for input(s): LIPASE, AMYLASE in the last 168 hours. No results for input(s): AMMONIA in the last 168 hours. Coagulation Profile: Recent Labs  Lab 04/07/18 1533 04/08/18 0310 04/09/18 0425 04/10/18 0235 04/11/18 0317  INR 2.20 1.97 1.47 1.36 1.09   Cardiac Enzymes: No results for input(s): CKTOTAL, CKMB, CKMBINDEX, TROPONINI in the last 168 hours. BNP (last 3 results) Recent Labs    03/18/18 1524 03/22/18 1140  PROBNP 2,934* 2,573*   HbA1C: No results for input(s): HGBA1C in the last 72 hours. CBG: No results for input(s): GLUCAP in the last 168 hours. Lipid Profile: No results for input(s): CHOL, HDL, LDLCALC, TRIG, CHOLHDL, LDLDIRECT in the last 72 hours. Thyroid Function Tests: No results for input(s): TSH, T4TOTAL, FREET4, T3FREE, THYROIDAB in the last 72 hours. Anemia Panel: No results for input(s): VITAMINB12, FOLATE, FERRITIN, TIBC, IRON, RETICCTPCT in the last 72 hours. Urine analysis: No results found for: COLORURINE, APPEARANCEUR, LABSPEC, PHURINE, GLUCOSEU, HGBUR, BILIRUBINUR, KETONESUR, PROTEINUR, UROBILINOGEN, NITRITE, LEUKOCYTESUR Sepsis Labs: @LABRCNTIP (procalcitonin:4,lacticidven:4)  )No results found for this or any previous visit (from the past 240 hour(s)).       Radiology Studies: No results found.      Scheduled Meds: . atorvastatin  80 mg Oral q1800  . carvedilol  3.125 mg Oral BID WC  . clopidogrel  75 mg Oral Daily  . colchicine  0.6 mg Oral Daily  . gabapentin  300 mg Oral q morning - 10a  . gabapentin  600 mg Oral QPM    . hydrOXYzine  100 mg Oral QHS  . pantoprazole  40 mg Oral Daily  . polyethylene glycol  17 g Oral Daily  . predniSONE  17 mg Oral Q breakfast  . tamsulosin  0.4 mg Oral Daily  . warfarin  5 mg Oral ONCE-1800  . Warfarin - Pharmacist Dosing Inpatient   Does not apply q1800   Continuous Infusions: . heparin       LOS: 0 days    Time spent: 62mn    PDomenic Polite MD [facility] Page via [email or web address], password [email or web address] After 7PM please contact night-coverage  04/11/2018, 11:31 AM

## 2018-04-11 NOTE — Progress Notes (Signed)
[place] for warfarin + heparin Indication: LV Thrombus  Allergies  Allergen Reactions  . Cephalexin Nausea And Vomiting    Other Reaction: GI UPSET  . Duloxetine Other (See Comments)    Headaches     Patient Measurements: Height: 5' 6.5" (168.9 cm) Weight: 165 lb (74.8 kg) IBW/kg (Calculated) : 64.95 Heparin Dosing Weight: 74.8kg  Vital Signs: Temp: 97.9 F (36.6 C) (01/12 2308) Temp Source: Oral (01/12 2308) BP: 112/72 (01/12 2308) Pulse Rate: 59 (01/12 2308)  Labs: Recent Labs    04/09/18 0425 04/09/18 1804 04/10/18 0235 04/11/18 0317 04/11/18 1553 04/11/18 2312  HGB 13.8 12.6* 11.3* 11.4*  --   --   HCT 44.7 40.0 35.7* 35.2*  --   --   PLT 327 302 246 258  --   --   LABPROT 17.6*  --  16.6* 14.0  --   --   INR 1.47  --  1.36 1.09  --   --   HEPARINUNFRC  --   --   --   --  0.18* 0.37  CREATININE  --   --  1.44*  --   --   --     Estimated Creatinine Clearance: 56.4 mL/min (A) (by C-G formula based on SCr of 1.44 mg/dL (H)).   Assessment: 71 YOM with hx of LV thrombus on warfarin PTA which has been held for rectal bleeding, now s/p colonoscopy and rectal polyp clipping and pharmacy consulted to resume warfarin with heparin bridge.  H/H low but stable post-op, plts 258.   Last wafarin dose 1/7, INR 1.09 today PTA wafarin dosing: 3m daily except 2.579mon Tue/Thur/Sat  Heparin level therapeutic (0.37) on gtt at 1050 units/hr. No bleeding noted.  Goal of Therapy:  Heparin level 0.3-0.5 units/ml Monitor platelets by anticoagulation protocol: Yes   Plan:  Continue heparin slightly at 1050 units/hr Will f/u daily heparin level and INR  CaSherlon HandingPharmD, BCPS Clinical pharmacist  **Pharmacist phone directory can now be found on [email or web address] (PW [email or web address]).  Listed under [place]1/03/2019 11:53 PM

## 2018-04-11 NOTE — Progress Notes (Signed)
   Patient Name: Jack [name] Date of Encounter: 04/11/2018, 9:47 AM    Subjective  No stools   Objective  BP 120/85 (BP Location: Left Arm)   Pulse 70   Temp 98 F (36.7 C) (Oral)   Resp 19   Ht 5' 6.5" (1.689 m)   Wt 74.8 kg   SpO2 97%   BMI 26.23 kg/m   CBC Latest Ref Rng & Units 04/11/2018 04/10/2018 04/09/2018  WBC 4.0 - 10.5 K/uL 12.9(H) 11.8(H) 12.5(H)  Hemoglobin 13.0 - 17.0 g/dL 11.4(L) 11.3(L) 12.6(L)  Hematocrit 39.0 - 52.0 % 35.2(L) 35.7(L) 40.0  Platelets 150 - 400 K/uL 258 246 302      Assessment and Plan  GI bleed from rectal polyp  (probably inflammatory) in setting of clopidogrel and warfarin, Crohn's  Hgb stable, no more bleeding  He is back on clopidogrel Heparin x 24 hrs today and restart warfarin Home tomorrow on warfarin if no problems We will arrange GI f/u though not urgent - obviously if more bleeding we will need to know and he should contact us   Gatha Mayer, MD, [facility] Gastroenterology 04/11/2018 9:47 AM Pager [phone]

## 2018-04-11 NOTE — Progress Notes (Signed)
[place] for warfarin + heparin Indication: LV Thrombus  Allergies  Allergen Reactions  . Cephalexin Nausea And Vomiting    Other Reaction: GI UPSET  . Duloxetine Other (See Comments)    Headaches     Patient Measurements: Height: 5' 6.5" (168.9 cm) Weight: 165 lb (74.8 kg) IBW/kg (Calculated) : 64.95 Heparin Dosing Weight: 74.8kg  Vital Signs: Temp: 97.9 F (36.6 C) (01/12 1657) Temp Source: Oral (01/12 1657) BP: 124/85 (01/12 1657) Pulse Rate: 67 (01/12 1657)  Labs: Recent Labs    04/09/18 0425 04/09/18 1804 04/10/18 0235 04/11/18 0317 04/11/18 1553  HGB 13.8 12.6* 11.3* 11.4*  --   HCT 44.7 40.0 35.7* 35.2*  --   PLT 327 302 246 258  --   LABPROT 17.6*  --  16.6* 14.0  --   INR 1.47  --  1.36 1.09  --   HEPARINUNFRC  --   --   --   --  0.18*  CREATININE  --   --  1.44*  --   --     Estimated Creatinine Clearance: 56.4 mL/min (A) (by C-G formula based on SCr of 1.44 mg/dL (H)).   Medical History: Past Medical History:  Diagnosis Date  . BPH (benign prostatic hypertrophy)   . CAD (coronary artery disease)    a. anterior STEMI 01/2018 -  proximal occlusion of LAD, treated with DES. Cath also showed 20% distal LM, 95% ostial-prox small-moderate ramus, 70-80% ostial Cx, 70% dominant ostial OM, 50-60% prox Cx, 70% RCA. EF 35% by cath with LVEDP 98mHg. Med rx for residual disease. Course complicated by post MI pericarditis and LV thrombus.  . Chronic systolic (congestive) heart failure ([place]   . Colitis   . Colon polyp    inflammatory  . Crohn disease ([place] 1992   history uveitis, involvement of intestines and lungs  . Diverticulosis   . Eosinophilic granuloma ([place]   . Essential hypertension   . GERD (gastroesophageal reflux disease)   . History of chicken pox   . History of gastroesophageal reflux (GERD)   . History of seizure 1995   grand mal x1, completed 6 yrs dilantin. no seizures since  . Hyperlipidemia   .  Internal hemorrhoids   . Ischemic cardiomyopathy   . LV (left ventricular) mural thrombus following MI ([place] 01/2018  . Osteoporosis 11/2015   DEXA T -2.9  . Schwannoma 2007   L axilla s/p surgery  . Seizures ([place]    last seizure 1995 and only x 1 seizure-   . Ulnar neuropathy    h/o this from L arm schwannoma     Assessment: 89YOM with hx of LV thrombus on warfarin PTA which has been held for rectal bleeding, now s/p colonoscopy and rectal polyp clipping and pharmacy consulted to resume warfarin with heparin bridge.  H/H low but stable post-op, plts 258.   Last wafarin dose 1/7, INR 1.09 today PTA wafarin dosing: 577mdaily except 2.58m64mn Tue/Thur/Sat  PM update: Initial heparin level subtherapeutic at 0.18 but was drawn at only ~4hrs post-heparin start. No bleeding or issues with infusion per discussion with RN.  Goal of Therapy:  Heparin level 0.3-0.5 units/ml Monitor platelets by anticoagulation protocol: Yes   Plan:  No bolus. Increase heparin slightly to 1050 units/hr as heparin level was not at steady state and f/u heparin level in 6hrs Warfarin 58mg658m x 1 scheduled for tonight Monitor daily INR, heparin level, CBC, s/s bleeding  Elicia Lamp, PharmD, BCPS Please check AMION for all [place] contact numbers Clinical Pharmacist 04/11/2018 5:04 PM

## 2018-04-12 ENCOUNTER — Encounter: Payer: Self-pay | Admitting: Gastroenterology

## 2018-04-12 DIAGNOSIS — I5022 Chronic systolic (congestive) heart failure: Secondary | ICD-10-CM | POA: Diagnosis present

## 2018-04-12 DIAGNOSIS — N179 Acute kidney failure, unspecified: Secondary | ICD-10-CM | POA: Diagnosis present

## 2018-04-12 DIAGNOSIS — K200 Eosinophilic esophagitis: Secondary | ICD-10-CM | POA: Diagnosis present

## 2018-04-12 DIAGNOSIS — I700 Atherosclerosis of aorta: Secondary | ICD-10-CM | POA: Diagnosis present

## 2018-04-12 DIAGNOSIS — K621 Rectal polyp: Secondary | ICD-10-CM | POA: Diagnosis present

## 2018-04-12 DIAGNOSIS — K5900 Constipation, unspecified: Secondary | ICD-10-CM | POA: Diagnosis not present

## 2018-04-12 DIAGNOSIS — K5080 Crohn's disease of both small and large intestine without complications: Secondary | ICD-10-CM | POA: Diagnosis present

## 2018-04-12 DIAGNOSIS — N400 Enlarged prostate without lower urinary tract symptoms: Secondary | ICD-10-CM | POA: Diagnosis present

## 2018-04-12 DIAGNOSIS — I513 Intracardiac thrombosis, not elsewhere classified: Secondary | ICD-10-CM | POA: Diagnosis present

## 2018-04-12 DIAGNOSIS — K50811 Crohn's disease of both small and large intestine with rectal bleeding: Secondary | ICD-10-CM

## 2018-04-12 DIAGNOSIS — I130 Hypertensive heart and chronic kidney disease with heart failure and stage 1 through stage 4 chronic kidney disease, or unspecified chronic kidney disease: Secondary | ICD-10-CM | POA: Diagnosis present

## 2018-04-12 DIAGNOSIS — K922 Gastrointestinal hemorrhage, unspecified: Secondary | ICD-10-CM | POA: Diagnosis present

## 2018-04-12 DIAGNOSIS — K5520 Angiodysplasia of colon without hemorrhage: Secondary | ICD-10-CM | POA: Diagnosis present

## 2018-04-12 DIAGNOSIS — K219 Gastro-esophageal reflux disease without esophagitis: Secondary | ICD-10-CM | POA: Diagnosis present

## 2018-04-12 DIAGNOSIS — K921 Melena: Secondary | ICD-10-CM | POA: Diagnosis not present

## 2018-04-12 DIAGNOSIS — Z7901 Long term (current) use of anticoagulants: Secondary | ICD-10-CM | POA: Diagnosis not present

## 2018-04-12 DIAGNOSIS — E785 Hyperlipidemia, unspecified: Secondary | ICD-10-CM | POA: Diagnosis present

## 2018-04-12 DIAGNOSIS — D62 Acute posthemorrhagic anemia: Secondary | ICD-10-CM | POA: Diagnosis not present

## 2018-04-12 DIAGNOSIS — I241 Dressler's syndrome: Secondary | ICD-10-CM | POA: Diagnosis present

## 2018-04-12 DIAGNOSIS — M810 Age-related osteoporosis without current pathological fracture: Secondary | ICD-10-CM | POA: Diagnosis present

## 2018-04-12 DIAGNOSIS — I2510 Atherosclerotic heart disease of native coronary artery without angina pectoris: Secondary | ICD-10-CM | POA: Diagnosis present

## 2018-04-12 DIAGNOSIS — D6832 Hemorrhagic disorder due to extrinsic circulating anticoagulants: Secondary | ICD-10-CM | POA: Diagnosis present

## 2018-04-12 DIAGNOSIS — J82 Pulmonary eosinophilia, not elsewhere classified: Secondary | ICD-10-CM | POA: Diagnosis present

## 2018-04-12 DIAGNOSIS — N183 Chronic kidney disease, stage 3 (moderate): Secondary | ICD-10-CM | POA: Diagnosis present

## 2018-04-12 DIAGNOSIS — T45515D Adverse effect of anticoagulants, subsequent encounter: Secondary | ICD-10-CM | POA: Diagnosis not present

## 2018-04-12 DIAGNOSIS — I252 Old myocardial infarction: Secondary | ICD-10-CM | POA: Diagnosis not present

## 2018-04-12 DIAGNOSIS — I255 Ischemic cardiomyopathy: Secondary | ICD-10-CM | POA: Diagnosis present

## 2018-04-12 LAB — CBC
HCT: 38.5 % — ABNORMAL LOW (ref 39.0–52.0)
HCT: 40.8 % (ref 39.0–52.0)
Hemoglobin: 12.2 g/dL — ABNORMAL LOW (ref 13.0–17.0)
Hemoglobin: 13.1 g/dL (ref 13.0–17.0)
MCH: 29 pg (ref 26.0–34.0)
MCH: 29.3 pg (ref 26.0–34.0)
MCHC: 31.7 g/dL (ref 30.0–36.0)
MCHC: 32.1 g/dL (ref 30.0–36.0)
MCV: 91.4 fL (ref 80.0–100.0)
MCV: 91.3 fL (ref 80.0–100.0)
NRBC: 0 % (ref 0.0–0.2)
Platelets: 293 10*3/uL (ref 150–400)
Platelets: 314 10*3/uL (ref 150–400)
RBC: 4.21 MIL/uL — ABNORMAL LOW (ref 4.22–5.81)
RBC: 4.47 MIL/uL (ref 4.22–5.81)
RDW: 13.2 % (ref 11.5–15.5)
RDW: 13.2 % (ref 11.5–15.5)
WBC: 14 10*3/uL — ABNORMAL HIGH (ref 4.0–10.5)
WBC: 14 10*3/uL — ABNORMAL HIGH (ref 4.0–10.5)
nRBC: 0 % (ref 0.0–0.2)

## 2018-04-12 LAB — BASIC METABOLIC PANEL
Anion gap: 7 (ref 5–15)
BUN: 20 mg/dL (ref 6–20)
CO2: 29 mmol/L (ref 22–32)
Calcium: 8.5 mg/dL — ABNORMAL LOW (ref 8.9–10.3)
Chloride: 105 mmol/L (ref 98–111)
Creatinine, Ser: 1.34 mg/dL — ABNORMAL HIGH (ref 0.61–1.24)
GFR calc Af Amer: >60 mL/min (ref >60)
GFR calc non Af Amer: >60 mL/min (ref >60)
Glucose, Bld: 115 mg/dL — ABNORMAL HIGH (ref 70–99)
POTASSIUM: 4.5 mmol/L (ref 3.5–5.1)
Sodium: 141 mmol/L (ref 135–145)

## 2018-04-12 LAB — HEPARIN LEVEL (UNFRACTIONATED)
Heparin Unfractionated: 0.62 IU/mL (ref 0.30–0.70)
Heparin Unfractionated: 0.27 IU/mL — ABNORMAL LOW (ref 0.30–0.70)

## 2018-04-12 LAB — PROTIME-INR
INR: 1.14
Prothrombin Time: 14.5 seconds (ref 11.4–15.2)

## 2018-04-12 LAB — MAGNESIUM: MAGNESIUM: 2.1 mg/dL (ref 1.7–2.4)

## 2018-04-12 MED ORDER — POLYETHYLENE GLYCOL 3350 17 G PO PACK
17.0000 g | PACK | Freq: Two times a day (BID) | ORAL | Status: DC
  Administered 2018-04-12 – 2018-04-13 (×2): 17 g via ORAL
  Filled 2018-04-12 (×3): qty 1

## 2018-04-12 MED ORDER — WARFARIN SODIUM 5 MG PO TABS
5.0000 mg | ORAL_TABLET | Freq: Once | ORAL | Status: AC
  Administered 2018-04-12: 5 mg via ORAL
  Filled 2018-04-12: qty 1

## 2018-04-12 NOTE — Progress Notes (Addendum)
[place] for warfarin + heparin Indication: LV Thrombus  Allergies  Allergen Reactions  . Cephalexin Nausea And Vomiting    Other Reaction: GI UPSET  . Duloxetine Other (See Comments)    Headaches     Patient Measurements: Height: 5' 6.5" (168.9 cm) Weight: 165 lb (74.8 kg) IBW/kg (Calculated) : 64.95 Heparin Dosing Weight: 74.8kg  Vital Signs: Temp: 98.4 F (36.9 C) (01/13 0844) Temp Source: Oral (01/13 0844) BP: 118/84 (01/13 0844) Pulse Rate: 60 (01/13 0844)  Labs: Recent Labs    04/10/18 0235 04/11/18 0317 04/11/18 1553 04/11/18 2312 04/12/18 0839  HGB 11.3* 11.4*  --   --  12.2*  HCT 35.7* 35.2*  --   --  38.5*  PLT 246 258  --   --  293  LABPROT 16.6* 14.0  --   --  14.5  INR 1.36 1.09  --   --  1.14  HEPARINUNFRC  --   --  0.18* 0.37 0.62  CREATININE 1.44*  --   --   --  1.34*    Estimated Creatinine Clearance: 60.6 mL/min (A) (by C-G formula based on SCr of 1.34 mg/dL (H)).   Assessment: 89 YOM with hx of LV thrombus on warfarin PTA which has been held for rectal bleeding, now s/p colonoscopy and rectal polyp clipping and pharmacy consulted to resume warfarin with heparin bridge.  Hgb stable post-op at 12.2, plts 293.    Last wafarin dose 1/7- restarted on 1/12, INR 1.09>1.14 today. PTA wafarin dosing: 87m daily except 2.512mon Tue/Thur/Sat  Heparin level is slightly supratherapeutic at 0.62, on 1050 units/hr. No infusion issues. Of note, did have BM with blood in it.    Goal of Therapy:  Heparin level 0.3-0.5 units/ml Monitor platelets by anticoagulation protocol: Yes    Plan:  Reduce heparin slightly at 950 units/hr Order warfarin 5 mg tonight Recheck heparin level in 6 hours Will f/u daily heparin level and INR  KiAntonietta JewelPharmD, BCCCP Clinical Pharmacist  Pager: [phone]: [phone]**Pharmacist phone directory can now be found on [email or web address] (PW [email or web address]).  Listed under [place]1/13/2020  10:00 AM

## 2018-04-12 NOTE — Progress Notes (Signed)
PROGRESS NOTE    Jack [name]  [id number]:[id number] DOB: [birth year] DOA: 04/07/2018 PCP: [name], MD  Brief Narrative: 53 y.o. male with medical history significant for recent STEMI s/p LAD PCI in 01/2018, ischemic cardiomyopathy, EF 30%, LV thrombus on coumadin, pericarditis, crohn disease on biologics and prednisone, chronic back pain s/p Spinal stimulator presented to ED with 6-7 episodes of hematochezia, in the emergency room found to have a hemoglobin hemoglobin of 13, INR was 2.4.  Coumadin and Plavix were held on admission. -Colonoscopy 1/10 with rectal polyp, status post polypectomy clipping -Remained stable, no further bleeding, restarted Plavix 1/11 -Started on Coumadin and heparin 1/12, recurrent bleeding episode today  Assessment & Plan:   Hematochezia -Due to rectal polyp, possibly inflammatory in the background of Plavix, Coumadin use and history of Crohn's disease -Did not have any active bleeding through most hospitalization, Coumadin and Plavix were held on admission, underwent colonoscopy 1/10 which noted a rectal polyp this was removed -After discussions with GI and cardiology Plavix restarted 1/11, and started heparin without bolus 1/12 and Coumadin 1/12 -Recurrent episode of bright red blood per rectum this morning, continue heparin drip with goal for low PTT and Coumadin, INR is 1.14 today -Monitor CBC closely, gastroenterology following  LV thrombus -In the background of ischemic cardiomyopathy and recent MI -Coumadin on hold, see above,  fortunately recent echo did not indicate any residual thrombus -Cardiology following, see discussion regarding anticoagulation above  History of recent STEMI -LAD stenosis status post PCI and drug-eluting stent in 11/201 9 -On Plavix at baseline, held and then resumed as above  Ischemic cardiomyopathy Secondary to STEMI. Current EF of 35-40% from Transthoracic Echocardiogram on 12/27.  -Currently on lasix as needed and  spironolactone -Clinically appears euvolemic, holding diuretics  Essential hypertension Patient is on Coreg, losartan and spironolactone as an outpatient. -Continue Coreg, holding losartan, spironolactone in setting of hematochezia  History of Crohn's disease -On prednisone for many years and Biologics every 8 weeks, last dose was day of admission -Continue home regimen of prednisone  BPH -Continue Flomax  Pericarditis post MI -Continue colchicine  CKD stage III Stable.  Hyperlipidemia -Continue Lipitor  GERD -Continue Protonix  DVT prophylaxis: SCDs Code Status: Full code Family Communication:  Spouse at bedside yesterday Disposition Plan:  Home when stable without further bleeding, hopefully soon Admission status: -Due to recurrent bleeding on observation changed patient's status to inpatient  Consultants:  Gastroenterology Cardiology   Procedures: Colonoscopy 1/10  Antimicrobials:    Subjective: -Episode of bright red bleeding this morning, denies any pain -No other symptoms or complaints  Objective: Vitals:   04/11/18 0910 04/11/18 1657 04/11/18 2308 04/12/18 0844  BP:  124/85 112/72 118/84  Pulse: 70 67 (!) 59 60  Resp:   16 18  Temp:  97.9 F (36.6 C) 97.9 F (36.6 C) 98.4 F (36.9 C)  TempSrc:  Oral Oral Oral  SpO2:  98% 100% 99%  Weight:      Height:        Intake/Output Summary (Last 24 hours) at 04/12/2018 1540 Last data filed at 04/12/2018 1300 Gross per 24 hour  Intake 1399.31 ml  Output -  Net 1399.31 ml   Filed Weights   04/07/18 1857  Weight: 74.8 kg    Examination:  Gen: Awake, Alert, Oriented X 3, no distress HEENT: PERRLA, Neck supple, no JVD Lungs: Good air movement bilaterally, CTAB CVS: RRR,No Gallops,Rubs or new Murmurs Abd: soft, Non tender, non distended, BS present Extremities: No  edema Skin: no new rashes    Data Reviewed:   CBC: Recent Labs  Lab 04/09/18 1804 04/10/18 0235 04/11/18 0317  04/12/18 0839 04/12/18 1400  WBC 12.5* 11.8* 12.9* 14.0* 14.0*  HGB 12.6* 11.3* 11.4* 12.2* 13.1  HCT 40.0 35.7* 35.2* 38.5* 40.8  MCV 91.5 89.0 90.7 91.4 91.3  PLT 302 246 258 293 633   Basic Metabolic Panel: Recent Labs  Lab 04/07/18 1533 04/08/18 0310 04/10/18 0235 04/12/18 0839  NA 135 139 138 141  K 4.7 4.2 3.4* 4.5  CL 106 105 106 105  CO2 21* 27 24 29   GLUCOSE 115* 89 109* 115*  BUN 29* 20 18 20   CREATININE 1.27* 1.31* 1.44* 1.34*  CALCIUM 9.0 8.3* 8.0* 8.5*   GFR: Estimated Creatinine Clearance: 60.6 mL/min (A) (by C-G formula based on SCr of 1.34 mg/dL (H)). Liver Function Tests: Recent Labs  Lab 04/07/18 1533  AST 25  ALT 32  ALKPHOS 39  BILITOT 1.0  PROT 6.5  ALBUMIN 3.8   No results for input(s): LIPASE, AMYLASE in the last 168 hours. No results for input(s): AMMONIA in the last 168 hours. Coagulation Profile: Recent Labs  Lab 04/08/18 0310 04/09/18 0425 04/10/18 0235 04/11/18 0317 04/12/18 0839  INR 1.97 1.47 1.36 1.09 1.14   Cardiac Enzymes: No results for input(s): CKTOTAL, CKMB, CKMBINDEX, TROPONINI in the last 168 hours. BNP (last 3 results) Recent Labs    03/18/18 1524 03/22/18 1140  PROBNP 2,934* 2,573*   HbA1C: No results for input(s): HGBA1C in the last 72 hours. CBG: No results for input(s): GLUCAP in the last 168 hours. Lipid Profile: No results for input(s): CHOL, HDL, LDLCALC, TRIG, CHOLHDL, LDLDIRECT in the last 72 hours. Thyroid Function Tests: No results for input(s): TSH, T4TOTAL, FREET4, T3FREE, THYROIDAB in the last 72 hours. Anemia Panel: No results for input(s): VITAMINB12, FOLATE, FERRITIN, TIBC, IRON, RETICCTPCT in the last 72 hours. Urine analysis: No results found for: COLORURINE, APPEARANCEUR, LABSPEC, PHURINE, GLUCOSEU, HGBUR, BILIRUBINUR, KETONESUR, PROTEINUR, UROBILINOGEN, NITRITE, LEUKOCYTESUR Sepsis Labs: @LABRCNTIP (procalcitonin:4,lacticidven:4)  )No results found for this or any previous visit (from  the past 240 hour(s)).       Radiology Studies: No results found.      Scheduled Meds: . atorvastatin  80 mg Oral q1800  . carvedilol  3.125 mg Oral BID WC  . clopidogrel  75 mg Oral Daily  . colchicine  0.6 mg Oral Daily  . gabapentin  300 mg Oral q morning - 10a  . gabapentin  600 mg Oral QPM  . hydrOXYzine  100 mg Oral QHS  . pantoprazole  40 mg Oral Daily  . polyethylene glycol  17 g Oral BID  . predniSONE  17 mg Oral Q breakfast  . tamsulosin  0.4 mg Oral Daily  . warfarin  5 mg Oral ONCE-1800  . Warfarin - Pharmacist Dosing Inpatient   Does not apply q1800   Continuous Infusions: . heparin 950 Units/hr (04/12/18 1259)     LOS: 0 days    Time spent: 71mn    PDomenic Polite MD [facility] Page via [email or web address], password [email or web address] After 7PM please contact night-coverage  04/12/2018, 3:40 PM

## 2018-04-12 NOTE — Progress Notes (Addendum)
ANTICOAGULATION CONSULT NOTE  Pharmacy Consult:  Heparin / Coumadin Indication: LV Thrombus  Allergies  Allergen Reactions  . Cephalexin Nausea And Vomiting    Other Reaction: GI UPSET  . Duloxetine Other (See Comments)    Headaches     Patient Measurements: Height: 5' 6.5" (168.9 cm) Weight: 165 lb (74.8 kg) IBW/kg (Calculated) : 64.95 Heparin Dosing Weight: 74.8kg  Vital Signs: Temp: 97.8 F (36.6 C) (01/13 1748) Temp Source: Oral (01/13 0844) BP: 121/80 (01/13 1748) Pulse Rate: 69 (01/13 1748)  Labs: Recent Labs    04/10/18 0235 04/11/18 0317  04/11/18 2312 04/12/18 0839 04/12/18 1400 04/12/18 1620  HGB 11.3* 11.4*  --   --  12.2* 13.1  --   HCT 35.7* 35.2*  --   --  38.5* 40.8  --   PLT 246 258  --   --  293 314  --   LABPROT 16.6* 14.0  --   --  14.5  --   --   INR 1.36 1.09  --   --  1.14  --   --   HEPARINUNFRC  --   --    < > 0.37 0.62  --  0.27*  CREATININE 1.44*  --   --   --  1.34*  --   --    < > = values in this interval not displayed.    Estimated Creatinine Clearance: 60.6 mL/min (A) (by C-G formula based on SCr of 1.34 mg/dL (H)).   Assessment: 13 YOM with history of LV thrombus on warfarin PTA which has been held for rectal bleeding.  Now s/p colonoscopy and rectal polyp clipping and pharmacy consulted to resume warfarin with heparin bridge.   Heparin level is now sub-therapeutic because infusion was off for over 4 hours for a shower.  It was resumed around 1720.  RN reported continued BRBPR.  Goal of Therapy:  Heparin level 0.3-0.5 units/ml Monitor platelets by anticoagulation protocol: Yes    Plan:  Continue heparin gtt at 950 units/hr Recheck heparin level in 6 hours   Kalid Ghan D. Mina Marble, PharmD, BCPS, [place] 04/12/2018, 6:07 PM

## 2018-04-12 NOTE — Progress Notes (Signed)
      Progress Note   Subjective  Patient had a good night, but had his first BM this AM, a small amount of stool but with bright red blood. I visualized it, was not a large amount but is fresh. He has no pain. He did not see previously placed hemostasis clip.   Objective   Vital signs in last 24 hours: Temp:  [97.9 F (36.6 C)] 97.9 F (36.6 C) (01/12 2308) Pulse Rate:  [59-70] 59 (01/12 2308) Resp:  [16] 16 (01/12 2308) BP: (112-124)/(72-85) 112/72 (01/12 2308) SpO2:  [98 %-100 %] 100 % (01/12 2308) Last BM Date: 04/09/18 General:    white male in NAD Heart:  Regular rate and rhythm; no murmurs Lungs: Respirations even and unlabored, lungs CTA bilaterally Abdomen:  Soft, nontender and nondistended. Normal bowel sounds. Extremities:  Without edema. Neurologic:  Alert and oriented,  grossly normal neurologically. Psych:  Cooperative. Normal mood and affect.  Intake/Output from previous day: 01/12 0701 - 01/13 0700 In: 799.3 [P.O.:640; I.V.:159.3] Out: -  Intake/Output this shift: No intake/output data recorded.  Lab Results: Recent Labs    04/09/18 1804 04/10/18 0235 04/11/18 0317  WBC 12.5* 11.8* 12.9*  HGB 12.6* 11.3* 11.4*  HCT 40.0 35.7* 35.2*  PLT 302 246 258   BMET Recent Labs    04/10/18 0235  NA 138  K 3.4*  CL 106  CO2 24  GLUCOSE 109*  BUN 18  CREATININE 1.44*  CALCIUM 8.0*   LFT No results for input(s): PROT, ALBUMIN, AST, ALT, ALKPHOS, BILITOT, BILIDIR, IBILI in the last 72 hours. PT/INR Recent Labs    04/10/18 0235 04/11/18 0317  LABPROT 16.6* 14.0  INR 1.36 1.09    Studies/Results: No results found.     Assessment / Plan:   53 y/o male with Crohn's disease and ischemic heart disease, recent STEMI with DES placement and LV thrombus, on Plavix and Coumadin, who was admitted with rectal bleeding. Colonoscopy 1/10 per Dr. [name] showed 75m polyp in the distal rectum which was actively oozing. Treated with epi and then removed with  hot forceps, and hemostasis clip placed.  He had been doing well so far. Plavix resumed 2 days ago, on heparin drip and supposed to be transitioned to Coumadin today, but had a constipated stool this AM with some bright red blood. Is not large volume bleeding but is fresh. Clip not seen in stool, unclear if bowel movement irritated the site in the distal rectum. No labs yet this AM.  Recommend: - CBC now, trend Hgb - would continue Plavix and heparin drip for now and monitor this AM for recurrent bleeding. Please contact [place] further bleeding symptoms.  - our team will reassess him this afternoon and discuss disposition. He agreed with the plan.   SCarolina Cellar MD [facility]

## 2018-04-13 LAB — HEPARIN LEVEL (UNFRACTIONATED)
Heparin Unfractionated: 0.28 IU/mL — ABNORMAL LOW (ref 0.30–0.70)
Heparin Unfractionated: 0.51 IU/mL (ref 0.30–0.70)
Heparin Unfractionated: 0.52 IU/mL (ref 0.30–0.70)

## 2018-04-13 LAB — CBC
HCT: 35.9 % — ABNORMAL LOW (ref 39.0–52.0)
Hemoglobin: 11.4 g/dL — ABNORMAL LOW (ref 13.0–17.0)
MCH: 28.5 pg (ref 26.0–34.0)
MCHC: 31.8 g/dL (ref 30.0–36.0)
MCV: 89.8 fL (ref 80.0–100.0)
PLATELETS: 263 10*3/uL (ref 150–400)
RBC: 4 MIL/uL — ABNORMAL LOW (ref 4.22–5.81)
RDW: 13.2 % (ref 11.5–15.5)
WBC: 14 10*3/uL — ABNORMAL HIGH (ref 4.0–10.5)
nRBC: 0 % (ref 0.0–0.2)

## 2018-04-13 LAB — PROTIME-INR
INR: 1.06
INR: 1.22
PROTHROMBIN TIME: 13.7 s (ref 11.4–15.2)
Prothrombin Time: 15.3 seconds — ABNORMAL HIGH (ref 11.4–15.2)

## 2018-04-13 MED ORDER — HEPARIN (PORCINE) 25000 UT/250ML-% IV SOLN
10.0000 [IU]/kg/h | INTRAVENOUS | Status: DC
  Administered 2018-04-13: 10 [IU]/kg/h via INTRAVENOUS
  Filled 2018-04-13: qty 250

## 2018-04-13 MED ORDER — HEPARIN (PORCINE) 25000 UT/250ML-% IV SOLN: 800.0000 [IU]/h | INTRAVENOUS | Status: DC

## 2018-04-13 MED ORDER — POLYETHYLENE GLYCOL 3350 17 G PO PACK: 17.0000 g | PACK | Freq: Every day | ORAL | Status: DC | PRN

## 2018-04-13 NOTE — Progress Notes (Addendum)
ANTICOAGULATION CONSULT NOTE - Follow Up Consult  Pharmacy Consult for heparin Indication: LV thrombus  Labs: Recent Labs    04/11/18 0317  04/12/18 0839 04/12/18 1400 04/12/18 1620 04/12/18 2350 04/13/18 0300 04/13/18 0900  HGB 11.4*  --  12.2* 13.1  --   --  11.4*  --   HCT 35.2*  --  38.5* 40.8  --   --  35.9*  --   PLT 258  --  293 314  --   --  263  --   LABPROT 14.0  --  14.5  --   --  13.7  --   --   INR 1.09  --  1.14  --   --  1.06  --   --   HEPARINUNFRC  --    < > 0.62  --  0.27* 0.51  --  0.52  CREATININE  --   --  1.34*  --   --   --   --   --    < > = values in this interval not displayed.    Assessment: 53yo male with history of LV thrombus on warfarin PTA.   HL 0.52 on heparin drip 900 units/hr.  HL slightly above goal. INR pending. Dr. Cranford Mon are holding heparin /warfarin 2/2  hematochezia , 4 episodes in 24 hr , h/o Crohn's dz.   Plan for flexible sigmoidoscopy tomorrow 1/15,   Goal of Therapy:  Heparin level 0.3-0.5 units/ml   Plan:  Heparin and warfarin are on hold per MD. Will f/u for restart of hep/warf/plavix post sigmoidoscopy     Nicole Cella, [place] Pharmacist [phone] Please check AMION for all [place] phone numbers After 10:00 PM, call [place] 04/13/2018,9:55 AM   ADDENDUM:   Assessment/Plan: GI changed plan for heparin drip, noting that since BM with zero blood (in past 1/2 hour)  Dr. Edison Nasuti thinks it is safest to keep him on his heparin drip.  GI order in to resume hep gtt at lower rate 10 uts/kg/hr = 750 units/hr. Still plans for flex sig tomorrow 1/15 Goal Heparin level remains lower end of therapeutic, goal HL 0.3-0.5 and no bolus per Broadus John. F/u 6 h HL at 1830 HL, INR, CBC daily  And will f/u post flex sig tomorrow 1/15 for restart of warfarin /heparin.   Nicole Cella, RPh Clinical Pharmacist [phone] 04/13/2018 11:57 AM Please check AMION for all [place] phone numbers After 10:00 PM, call [place] [phone]

## 2018-04-13 NOTE — Progress Notes (Signed)
ANTICOAGULATION CONSULT NOTE - Follow Up Consult  Pharmacy Consult for heparin Indication: LV thrombus  Labs: Recent Labs    04/10/18 0235 04/11/18 0317  04/12/18 0839 04/12/18 1400 04/12/18 1620 04/12/18 2350  HGB 11.3* 11.4*  --  12.2* 13.1  --   --   HCT 35.7* 35.2*  --  38.5* 40.8  --   --   PLT 246 258  --  293 314  --   --   LABPROT 16.6* 14.0  --  14.5  --   --  13.7  INR 1.36 1.09  --  1.14  --   --  1.06  HEPARINUNFRC  --   --    < > 0.62  --  0.27* 0.51  CREATININE 1.44*  --   --  1.34*  --   --   --    < > = values in this interval not displayed.    Assessment: 53yo male now at upper end of low therapeutic goal on heparin after resumed; no gtt issues but RN reports that BMs are still bloody.  Goal of Therapy:  Heparin level 0.3-0.5 units/ml   Plan:  Will decrease heparin gtt slightly to 900 units/hr and check level in 6 hours.    Wynona Neat, PharmD, BCPS  04/13/2018,12:29 AM

## 2018-04-13 NOTE — Progress Notes (Signed)
PROGRESS NOTE    Jack [name]  LPF:[id number] DOB: 1966/03/03 DOA: 04/07/2018 PCP: [name], MD  Brief Narrative: 53 y.o. male with medical history significant for recent STEMI s/p LAD PCI in 01/2018, ischemic cardiomyopathy, EF 30%, LV thrombus on coumadin, pericarditis, crohn disease on biologics and prednisone, chronic back pain s/p Spinal stimulator presented to ED with 6-7 episodes of hematochezia, in the emergency room found to have a hemoglobin hemoglobin of 13, INR was 2.4.  Coumadin and Plavix were held on admission. -Colonoscopy 1/10 with rectal polyp, status post polypectomy clipping -Remained stable, no further bleeding, restarted Plavix 1/11 -Started on Coumadin and heparin 1/12, recurrent bleeding episode today  Assessment & Plan:   Hematochezia -Due to inflammatory rectal polyp, the background of Plavix, Coumadin use -Coumadin and Plavix were held on admission, underwent colonoscopy 1/10 rectal polyp was removed and, injected and clipped, subsequently restarted on Plavix and Coumadin with heparin bridge without bolus -Has had recurrent bleeding small amounts since yesterday morning -Gastroenterology following, last episode was earlier this morning followed by a normal stool without blood, plan for flex sig tomorrow to reevaluate the site of prior rectal polyp clip -continue heparin, aim for lower PTT, Plavix held again -Discussed with cardiology Dr. Burt Knack this morning  LV thrombus -In the background of ischemic cardiomyopathy and recent MI -fortunately recent echo did not indicate any residual thrombus -Cardiology following, see discussion regarding anticoagulation above  History of recent STEMI -LAD stenosis status post PCI and drug-eluting stent in 01/2018 -On Plavix at baseline, this was temporarily held, then restarted, and held again today, last dose 1/13  Ischemic cardiomyopathy Secondary to STEMI. Current EF of 35-40% from Transthoracic Echocardiogram  on 12/27.  -Currently on lasix as needed and spironolactone -Has remained euvolemic throughout this hospitalization without requiring diuretics  Essential hypertension Patient is on Coreg, losartan and spironolactone as an outpatient. -Continue Coreg, holding losartan, spironolactone in setting of hematochezia  History of Crohn's disease -On prednisone for many years and Biologics every 8 weeks, last dose was day of admission -Continue home regimen of prednisone  BPH -Continue Flomax  Pericarditis post MI -Continue colchicine  CKD stage III Stable.  Hyperlipidemia -Continue Lipitor  GERD -Continue Protonix  DVT prophylaxis: SCDs Code Status: Full code Family Communication:  No family at bedside today, discussed with spouse earlier this admission Disposition Plan:  Home when stable without further bleeding, hopefully soon Admission status: -Due to recurrent bleeding on observation changed patient's status to inpatient  Consultants:  Gastroenterology Cardiology   Procedures: Colonoscopy 1/10  Antimicrobials:    Subjective: -Few episodes of bright red blood with stool yesterday, small amounts, slightly larger episode this morning, after this had another stool with brown blood  Objective: Vitals:   04/12/18 1748 04/13/18 0055 04/13/18 0837 04/13/18 0944  BP: 121/80 (!) 106/57 120/75   Pulse: 69 (!) 59 (!) 58 67  Resp: 18 10 18    Temp: 97.8 F (36.6 C) (!) 97.5 F (36.4 C) 98.7 F (37.1 C)   TempSrc:   Oral   SpO2: 98% 98% 99%   Weight:      Height:        Intake/Output Summary (Last 24 hours) at 04/13/2018 1356 Last data filed at 04/13/2018 1301 Gross per 24 hour  Intake 376.79 ml  Output -  Net 376.79 ml   Filed Weights   04/07/18 1857  Weight: 74.8 kg    Examination:  Gen: Awake, Alert, Oriented X 3, no distress HEENT: PERRLA, Neck  supple, no JVD Lungs: Clear CVS: RRR,No Gallops,Rubs or new Murmurs Abd: soft, Non tender, non  distended, BS present Extremities: No edema Skin: no new rashes   Data Reviewed:   CBC: Recent Labs  Lab 04/10/18 0235 04/11/18 0317 04/12/18 0839 04/12/18 1400 04/13/18 0300  WBC 11.8* 12.9* 14.0* 14.0* 14.0*  HGB 11.3* 11.4* 12.2* 13.1 11.4*  HCT 35.7* 35.2* 38.5* 40.8 35.9*  MCV 89.0 90.7 91.4 91.3 89.8  PLT 246 258 293 314 159   Basic Metabolic Panel: Recent Labs  Lab 04/07/18 1533 04/08/18 0310 04/10/18 0235 04/12/18 0839 04/12/18 1400  NA 135 139 138 141  --   K 4.7 4.2 3.4* 4.5  --   CL 106 105 106 105  --   CO2 21* 27 24 29   --   GLUCOSE 115* 89 109* 115*  --   BUN 29* 20 18 20   --   CREATININE 1.27* 1.31* 1.44* 1.34*  --   CALCIUM 9.0 8.3* 8.0* 8.5*  --   MG  --   --   --   --  2.1   GFR: Estimated Creatinine Clearance: 60.6 mL/min (A) (by C-G formula based on SCr of 1.34 mg/dL (H)). Liver Function Tests: Recent Labs  Lab 04/07/18 1533  AST 25  ALT 32  ALKPHOS 39  BILITOT 1.0  PROT 6.5  ALBUMIN 3.8   No results for input(s): LIPASE, AMYLASE in the last 168 hours. No results for input(s): AMMONIA in the last 168 hours. Coagulation Profile: Recent Labs  Lab 04/10/18 0235 04/11/18 0317 04/12/18 0839 04/12/18 2350 04/13/18 0900  INR 1.36 1.09 1.14 1.06 1.22   Cardiac Enzymes: No results for input(s): CKTOTAL, CKMB, CKMBINDEX, TROPONINI in the last 168 hours. BNP (last 3 results) Recent Labs    03/18/18 1524 03/22/18 1140  PROBNP 2,934* 2,573*   HbA1C: No results for input(s): HGBA1C in the last 72 hours. CBG: No results for input(s): GLUCAP in the last 168 hours. Lipid Profile: No results for input(s): CHOL, HDL, LDLCALC, TRIG, CHOLHDL, LDLDIRECT in the last 72 hours. Thyroid Function Tests: No results for input(s): TSH, T4TOTAL, FREET4, T3FREE, THYROIDAB in the last 72 hours. Anemia Panel: No results for input(s): VITAMINB12, FOLATE, FERRITIN, TIBC, IRON, RETICCTPCT in the last 72 hours. Urine analysis: No results found for:  COLORURINE, APPEARANCEUR, LABSPEC, PHURINE, GLUCOSEU, HGBUR, BILIRUBINUR, KETONESUR, PROTEINUR, UROBILINOGEN, NITRITE, LEUKOCYTESUR Sepsis Labs: @LABRCNTIP (procalcitonin:4,lacticidven:4)  )No results found for this or any previous visit (from the past 240 hour(s)).       Radiology Studies: No results found.      Scheduled Meds: . atorvastatin  80 mg Oral q1800  . carvedilol  3.125 mg Oral BID WC  . colchicine  0.6 mg Oral Daily  . gabapentin  300 mg Oral q morning - 10a  . gabapentin  600 mg Oral QPM  . hydrOXYzine  100 mg Oral QHS  . pantoprazole  40 mg Oral Daily  . polyethylene glycol  17 g Oral BID  . predniSONE  17 mg Oral Q breakfast  . tamsulosin  0.4 mg Oral Daily  . Warfarin - Pharmacist Dosing Inpatient   Does not apply q1800   Continuous Infusions: . heparin 10 Units/kg/hr (04/13/18 1243)     LOS: 1 day    Time spent: 20mn    PDomenic Polite MD [facility] Page via [email or web address], password [email or web address] After 7PM please contact night-coverage  04/13/2018, 1:56 PM

## 2018-04-13 NOTE — Progress Notes (Signed)
Request was made to discontinue cardiac monitoring for patient.  Jack [name] agreed to discontinue  order for cardiac monitoring.

## 2018-04-13 NOTE — Progress Notes (Addendum)
Daily Rounding Note  04/13/2018, 9:02 AM  LOS: 1 day   SUBJECTIVE:   Chief complaint: Painless hematochezia. Patient has had a total of about 4, small volume, liquid bouts of hematochezia starting a little more than 24 hours ago.  Last episode was this morning.  No abdominal pain.  No dizziness, no shortness of breath.  Eating well.  Has not seen any formed or brown stools but he is receiving MiraLAX. He feels well, frustrated with prolonged admission  OBJECTIVE:         Vital signs in last 24 hours:    Temp:  [97.5 F (36.4 C)-98.7 F (37.1 C)] 98.7 F (37.1 C) (01/14 0837) Pulse Rate:  [58-69] 58 (01/14 0837) Resp:  [10-18] 18 (01/14 0837) BP: (106-121)/(57-80) 120/75 (01/14 0837) SpO2:  [98 %-99 %] 99 % (01/14 0837) Last BM Date: 04/13/18 Filed Weights   04/07/18 1857  Weight: 74.8 kg   General: looks well   Heart: RRR Chest: clear bil.  No SOB or cough Abdomen: soft, NT, ND.  Active BS  Extremities: no CCE Neuro/Psych:  Oriented x 3, no weakness, confusion, tremors.    Intake/Output from previous day: 01/13 0701 - 01/14 0700 In: 718.8 [P.O.:600; I.V.:118.8] Out: -   Intake/Output this shift: No intake/output data recorded.  Lab Results: Recent Labs    04/12/18 0839 04/12/18 1400 04/13/18 0300  WBC 14.0* 14.0* 14.0*  HGB 12.2* 13.1 11.4*  HCT 38.5* 40.8 35.9*  PLT 293 314 263   BMET Recent Labs    04/12/18 0839  NA 141  K 4.5  CL 105  CO2 29  GLUCOSE 115*  BUN 20  CREATININE 1.34*  CALCIUM 8.5*   PT/INR Recent Labs    04/12/18 0839 04/12/18 2350  LABPROT 14.5 13.7  INR 1.14 1.06   Scheduled Meds: . atorvastatin  80 mg Oral q1800  . carvedilol  3.125 mg Oral BID WC  . clopidogrel  75 mg Oral Daily  . colchicine  0.6 mg Oral Daily  . gabapentin  300 mg Oral q morning - 10a  . gabapentin  600 mg Oral QPM  . hydrOXYzine  100 mg Oral QHS  . pantoprazole  40 mg Oral Daily  .  polyethylene glycol  17 g Oral BID  . predniSONE  17 mg Oral Q breakfast  . tamsulosin  0.4 mg Oral Daily  . Warfarin - Pharmacist Dosing Inpatient   Does not apply q1800   Continuous Infusions: . heparin 900 Units/hr (04/13/18 0035)   PRN Meds:.acetaminophen **OR** acetaminophen, HYDROmorphone (DILAUDID) injection, Melatonin, ondansetron **OR** ondansetron (ZOFRAN) IV, senna-docusate   ASSESMENT:   *    Hematochezia in patient with history of colonic Crohn's disease. 04/03/2018 colonoscopy.  Pseudopolyps in the descending and transverse colon.  5 mm rectal polyp removed with hot biopsy injected with epinephrine and Endo Clipped.  Normal TI.  No active Crohn's.  Polyp Pathology: Inflammatory polyp  *     Blood loss anemia.  Hgb drop of about 1.5 g in the last 12 hours  *    Recent STEMI with DES placement, complicated by LV thrombus, pericarditis.  Treated with Plavix, Coumadin, Colchicine.   INR 1.0 Restarted Plavix on 1/11.  Last dose of Coumadin was on 1/12.  Continues on IV heparin.  *    Steroid dependent pneumonitis, hx vasculitis.   PLAN   *   Case discussed with Dr. Ardis Hughs.  He would like  to hold the IV heparin, hold the Plavix (ok with Dr Broadus John)  and proceed with flexible sigmoidoscopy tomorrow, set for 2 PM.  Tap water enema at 11:30 AM.  .    Azucena Freed  04/13/2018, 9:02 AM Phone [phone]   ________________________________________________________________________  Velora Heckler GI MD note:  I personally examined the patient, reviewed the data and agree with the assessment and plan described above.  AFter more thought and a BM with zero blood (in past 1/2 hour) I think it is safest to keep him on his heparin drip and I've asked the RN to restart it.  He needs to be on both coumadin and plavix and has an underlying condition which can easily bleed (Crohn's). I suspect he is intermittently oozing from the site of recent (very small) polyp resection.  Planing on flex sig  tomorrow.    Owens Loffler, MD [facility] Gastroenterology Pager [phone]

## 2018-04-13 NOTE — Plan of Care (Signed)
  Problem: Education: Goal: Knowledge of General Education information will improve Description Including pain rating scale, medication(s)/side effects and non-pharmacologic comfort measures Outcome: Progressing   Problem: Health Behavior/Discharge Planning: Goal: Ability to manage health-related needs will improve Outcome: Progressing   Problem: Nutrition: Goal: Adequate nutrition will be maintained Outcome: Progressing   Problem: Elimination: Goal: Will not experience complications related to bowel motility Outcome: Progressing   Problem: Safety: Goal: Ability to remain free from injury will improve Outcome: Progressing   Problem: Skin Integrity: Goal: Risk for impaired skin integrity will decrease Outcome: Progressing   Problem: Education: Goal: Ability to identify signs and symptoms of gastrointestinal bleeding will improve Outcome: Progressing

## 2018-04-13 NOTE — Progress Notes (Addendum)
ANTICOAGULATION CONSULT NOTE - Follow Up Consult  Pharmacy Consult for heparin Indication: LV thrombus  Labs: Recent Labs    04/12/18 0839 04/12/18 1400  04/12/18 2350 04/13/18 0300 04/13/18 0900 04/13/18 1914  HGB 12.2* 13.1  --   --  11.4*  --   --   HCT 38.5* 40.8  --   --  35.9*  --   --   PLT 293 314  --   --  263  --   --   LABPROT 14.5  --   --  13.7  --  15.3*  --   INR 1.14  --   --  1.06  --  1.22  --   HEPARINUNFRC 0.62  --    < > 0.51  --  0.52 0.28*  CREATININE 1.34*  --   --   --   --   --   --    < > = values in this interval not displayed.    Assessment: 53yo male with history of LV thrombus on warfarin PTA.   HL 0.52 on heparin drip 900 units/hr.  HL slightly above goal. INR pending. Dr. Cranford Mon are holding heparin /warfarin 2/2  hematochezia , 4 episodes in 24 hr , h/o Crohn's dz.   Plan for flexible sigmoidoscopy tomorrow 1/15.  Heparin was initially held this AM due to bleeding but resumed in PM per GI as no further bleeding noted. Heparin was resumed at lower rate of 750 units/hr with tight goal of 0.3 to 0.5 and no boluses.   Heparin level this PM is just slightly low at 0.28 on 750 units/hr. Will bump up slightly and follow-up plans for heparin in AM with Fleg Sig scheduled for 2 PM. RN states patient voices no further bleeding.    Goal of Therapy:  Heparin level 0.3-0.5 units/ml   Plan:  Increase Heparin to 800 units/hr (no bolus).  Follow-up AM level Follow-up anticoagulation plan with flex sig and 2PM tomorrow.   Sloan Leiter, PharmD, BCPS, BCCCP Clinical Pharmacist 04/13/2018 8:25 PM Please check AMION for all [place] phone numbers After 10:00 PM, call [place] [phone]

## 2018-04-13 NOTE — H&P (View-Only) (Signed)
Daily Rounding Note  04/13/2018, 9:02 AM  LOS: 1 day   SUBJECTIVE:   Chief complaint: Painless hematochezia. Patient has had a total of about 4, small volume, liquid bouts of hematochezia starting a little more than 24 hours ago.  Last episode was this morning.  No abdominal pain.  No dizziness, no shortness of breath.  Eating well.  Has not seen any formed or brown stools but he is receiving MiraLAX. He feels well, frustrated with prolonged admission  OBJECTIVE:         Vital signs in last 24 hours:    Temp:  [97.5 F (36.4 C)-98.7 F (37.1 C)] 98.7 F (37.1 C) (01/14 0837) Pulse Rate:  [58-69] 58 (01/14 0837) Resp:  [10-18] 18 (01/14 0837) BP: (106-121)/(57-80) 120/75 (01/14 0837) SpO2:  [98 %-99 %] 99 % (01/14 0837) Last BM Date: 04/13/18 Filed Weights   04/07/18 1857  Weight: 74.8 kg   General: looks well   Heart: RRR Chest: clear bil.  No SOB or cough Abdomen: soft, NT, ND.  Active BS  Extremities: no CCE Neuro/Psych:  Oriented x 3, no weakness, confusion, tremors.    Intake/Output from previous day: 01/13 0701 - 01/14 0700 In: 718.8 [P.O.:600; I.V.:118.8] Out: -   Intake/Output this shift: No intake/output data recorded.  Lab Results: Recent Labs    04/12/18 0839 04/12/18 1400 04/13/18 0300  WBC 14.0* 14.0* 14.0*  HGB 12.2* 13.1 11.4*  HCT 38.5* 40.8 35.9*  PLT 293 314 263   BMET Recent Labs    04/12/18 0839  NA 141  K 4.5  CL 105  CO2 29  GLUCOSE 115*  BUN 20  CREATININE 1.34*  CALCIUM 8.5*   PT/INR Recent Labs    04/12/18 0839 04/12/18 2350  LABPROT 14.5 13.7  INR 1.14 1.06   Scheduled Meds: . atorvastatin  80 mg Oral q1800  . carvedilol  3.125 mg Oral BID WC  . clopidogrel  75 mg Oral Daily  . colchicine  0.6 mg Oral Daily  . gabapentin  300 mg Oral q morning - 10a  . gabapentin  600 mg Oral QPM  . hydrOXYzine  100 mg Oral QHS  . pantoprazole  40 mg Oral Daily  .  polyethylene glycol  17 g Oral BID  . predniSONE  17 mg Oral Q breakfast  . tamsulosin  0.4 mg Oral Daily  . Warfarin - Pharmacist Dosing Inpatient   Does not apply q1800   Continuous Infusions: . heparin 900 Units/hr (04/13/18 0035)   PRN Meds:.acetaminophen **OR** acetaminophen, HYDROmorphone (DILAUDID) injection, Melatonin, ondansetron **OR** ondansetron (ZOFRAN) IV, senna-docusate   ASSESMENT:   *    Hematochezia in patient with history of colonic Crohn's disease. 04/03/2018 colonoscopy.  Pseudopolyps in the descending and transverse colon.  5 mm rectal polyp removed with hot biopsy injected with epinephrine and Endo Clipped.  Normal TI.  No active Crohn's.  Polyp Pathology: Inflammatory polyp  *     Blood loss anemia.  Hgb drop of about 1.5 g in the last 12 hours  *    Recent STEMI with DES placement, complicated by LV thrombus, pericarditis.  Treated with Plavix, Coumadin, Colchicine.   INR 1.0 Restarted Plavix on 1/11.  Last dose of Coumadin was on 1/12.  Continues on IV heparin.  *    Steroid dependent pneumonitis, hx vasculitis.   PLAN   *   Case discussed with Dr. Ardis Hughs.  He would like  to hold the IV heparin, hold the Plavix (ok with Dr Broadus John)  and proceed with flexible sigmoidoscopy tomorrow, set for 2 PM.  Tap water enema at 11:30 AM.  .    Azucena Freed  04/13/2018, 9:02 AM Phone [phone]   ________________________________________________________________________  Velora Heckler GI MD note:  I personally examined the patient, reviewed the data and agree with the assessment and plan described above.  AFter more thought and a BM with zero blood (in past 1/2 hour) I think it is safest to keep him on his heparin drip and I've asked the RN to restart it.  He needs to be on both coumadin and plavix and has an underlying condition which can easily bleed (Crohn's). I suspect he is intermittently oozing from the site of recent (very small) polyp resection.  Planing on flex sig  tomorrow.    Owens Loffler, MD [facility] Gastroenterology Pager [phone]

## 2018-04-14 ENCOUNTER — Encounter (HOSPITAL_COMMUNITY): Payer: Self-pay | Admitting: Gastroenterology

## 2018-04-14 ENCOUNTER — Encounter (HOSPITAL_COMMUNITY)
Admission: EM | Disposition: A | Discharge status: Home / Self Care | Payer: Self-pay | Source: Home / Self Care | Attending: Internal Medicine

## 2018-04-14 HISTORY — PX: FLEXIBLE SIGMOIDOSCOPY: SHX5431

## 2018-04-14 LAB — BASIC METABOLIC PANEL
Anion gap: 11 (ref 5–15)
BUN: 23 mg/dL — ABNORMAL HIGH (ref 6–20)
CO2: 24 mmol/L (ref 22–32)
Calcium: 8.8 mg/dL — ABNORMAL LOW (ref 8.9–10.3)
Chloride: 105 mmol/L (ref 98–111)
Creatinine, Ser: 1.27 mg/dL — ABNORMAL HIGH (ref 0.61–1.24)
GFR calc Af Amer: >60 mL/min (ref >60)
GFR calc non Af Amer: >60 mL/min (ref >60)
Glucose, Bld: 87 mg/dL (ref 70–99)
Potassium: 4.1 mmol/L (ref 3.5–5.1)
SODIUM: 140 mmol/L (ref 135–145)

## 2018-04-14 LAB — CBC
HCT: 36.3 % — ABNORMAL LOW (ref 39.0–52.0)
Hemoglobin: 11.6 g/dL — ABNORMAL LOW (ref 13.0–17.0)
MCH: 28.9 pg (ref 26.0–34.0)
MCHC: 32 g/dL (ref 30.0–36.0)
MCV: 90.5 fL (ref 80.0–100.0)
Platelets: 270 10*3/uL (ref 150–400)
RBC: 4.01 MIL/uL — ABNORMAL LOW (ref 4.22–5.81)
RDW: 13.1 % (ref 11.5–15.5)
WBC: 14.5 10*3/uL — ABNORMAL HIGH (ref 4.0–10.5)
nRBC: 0 % (ref 0.0–0.2)

## 2018-04-14 LAB — PROTIME-INR
INR: 1.2
Prothrombin Time: 15.1 seconds (ref 11.4–15.2)

## 2018-04-14 LAB — HEPARIN LEVEL (UNFRACTIONATED): Heparin Unfractionated: 0.36 IU/mL (ref 0.30–0.70)

## 2018-04-14 SURGERY — SIGMOIDOSCOPY, FLEXIBLE
Anesthesia: Moderate Sedation

## 2018-04-14 MED ORDER — SODIUM CHLORIDE 0.9 % IV SOLN: INTRAVENOUS | Status: DC

## 2018-04-14 MED ORDER — MIDAZOLAM HCL (PF) 5 MG/ML IJ SOLN
INTRAMUSCULAR | Status: AC
  Filled 2018-04-14: qty 2

## 2018-04-14 MED ORDER — MIDAZOLAM HCL (PF) 10 MG/2ML IJ SOLN
INTRAMUSCULAR | Status: DC | PRN
  Administered 2018-04-14 (×3): 2 mg via INTRAVENOUS

## 2018-04-14 MED ORDER — FENTANYL CITRATE (PF) 100 MCG/2ML IJ SOLN
INTRAMUSCULAR | Status: DC | PRN
  Administered 2018-04-14 (×2): 25 ug via INTRAVENOUS

## 2018-04-14 MED ORDER — WARFARIN SODIUM 5 MG PO TABS
5.0000 mg | ORAL_TABLET | Freq: Once | ORAL | Status: AC
  Administered 2018-04-14: 5 mg via ORAL
  Filled 2018-04-14 (×2): qty 1

## 2018-04-14 MED ORDER — WARFARIN SODIUM 5 MG PO TABS: ORAL_TABLET | ORAL | 0 refills | Status: DC

## 2018-04-14 MED ORDER — FENTANYL CITRATE (PF) 100 MCG/2ML IJ SOLN
INTRAMUSCULAR | Status: AC
  Filled 2018-04-14: qty 2

## 2018-04-14 NOTE — Op Note (Signed)
[facility] Patient Name: Jack [name] Procedure Date : 04/14/2018 MRN: [id number] Attending MD: Milus Banister , MD Date of Birth: 04-19-1965 CSN: [id number] Age: 53 Admit Type: Inpatient Procedure:                Flexible Sigmoidoscopy Indications:              Hematochezia, intermittent since colonoscopy last                            week, rectal polyp removal with clip placement,                            longstanding Crohn's as well Providers:                Milus Banister, MD, Cleda Daub, RN, William Dalton, Technician Referring MD:              Medicines:                Fentanyl 50 micrograms IV, Midazolam 5 mg IV Complications:            No immediate complications. Estimated blood loss:                            None. Estimated Blood Loss:     Estimated blood loss: none. Procedure:                Pre-Anesthesia Assessment:                           - Prior to the procedure, a History and Physical                            was performed, and patient medications and                            allergies were reviewed. The patient's tolerance of                            previous anesthesia was also reviewed. The risks                            and benefits of the procedure and the sedation                            options and risks were discussed with the patient.                            All questions were answered, and informed consent                            was obtained. Prior Anticoagulants: The patient has  taken coumadin, plavix and heparin. ASA Grade                            Assessment: III - A patient with severe systemic                            disease. After reviewing the risks and benefits,                            the patient was deemed in satisfactory condition to                            undergo the procedure.                           After obtaining informed  consent, the scope was                            passed under direct vision. The CF-HQ190L ([id number])                            Olympus colonoscope was introduced through the anus                            and advanced to the the rectosigmoid junction. The                            flexible sigmoidoscopy was accomplished without                            difficulty. The patient tolerated the procedure                            well. The quality of the bowel preparation was good. Scope In: 3:13:29 PM Scope Out: 3:16:44 PM Total Procedure Duration: 0 hours 3 minutes 15 seconds  Findings:      The site of his recent rectal polypectomy was easily located. There was       still one endoclip in place and a small to medium sized adherent clot at       the site. I placed another endoclip at the site given his intermittent       (mild) rectal bleeding and need for both plavix and coumadin going       forward.      Examination to the sigmoid was othewise normal. Impression:               - The site of his recent rectal polypectomy was                            easily located. There was still one endoclip in                            place and a small to medium sized adherent clot at  the site. I placed another endoclip at the site                            given his intermittent (mild) rectal bleeding and                            need for both plavix and coumadin going forward. Recommendation:           - Return patient to hospital ward for possible                            discharge same day.                           - Per hospitalist (and cardiology) he can restart                            both plavix and coumadin today without need for                            bridge. Procedure Code(s):        --- Professional ---                           [phone], 82, Sigmoidoscopy, flexible; with control of                            bleeding, any method Diagnosis  Code(s):        --- Professional ---                           K55.20, Angiodysplasia of colon without hemorrhage                           K92.1, Melena (includes Hematochezia) CPT copyright 2018 [employer] Association. All rights reserved. The codes documented in this report are preliminary and upon coder review may  be revised to meet current compliance requirements. Milus Banister, MD 04/14/2018 3:28:22 PM This report has been signed electronically. Number of Addenda: 0

## 2018-04-14 NOTE — Discharge Summary (Signed)
Physician Discharge Summary  Jack [name] EXH:[id number] DOB: [birth year] DOA: 04/07/2018  PCP: [name], MD  Admit date: 04/07/2018 Discharge date: 04/14/2018  Time spent: 45 minutes  Recommendations for Outpatient Follow-up:  Coumadin clinic on 1/17 for INR check Cardiology Sharrell Ku on 1/21 for hospital follow-up PCP in 1 week Gastroenterology Dr. [name] in 1 month  Discharge Diagnoses:  Principal Problem:   Hematochezia   Inflammatory rectal polyp   Crohn disease ([place])   Essential hypertension   Benign prostatic hyperplasia   LV (left ventricular) mural thrombus following MI ([place])   Hyperlipidemia   Ischemic cardiomyopathy   GI bleed   Discharge Condition: Stable  Diet recommendation: Low-sodium, heart healthy  Filed Weights   04/07/18 1857  Weight: 74.8 kg    History of present illness:  53 y.o.malewith medical history significant for recentSTEMI s/p LAD PCI in 01/2018, ischemic cardiomyopathy, EF 30%, LV thrombus on coumadin, pericarditis, crohn disease on biologics and prednisone, chronic back pain s/p Spinal stimulator presented to ED with 6-7 episodes of hematochezia  Procedures:   Hematochezia -Due to inflammatory rectal polyp, the background of Plavix, Coumadin use, INR was 2.4 on admission -Coumadin and Plavix were held on admission, underwent colonoscopy 1/10 rectal polyp was removed and, injected and clipped, subsequently restarted on Plavix and Coumadin with heparin bridge without bolus -Has had recurrent bleeding small amounts for 48 hours on IV heparin, now appears to have subsided -Gastroenterology following, flex sig completed today, no further bleeding, additional clip placed at prior polypectpmy site. -Discussed with cardiology Dr. Burt Knack and Gwenlyn Found today regarding above meds, plan to discharge home on Plavix and Coumadin without bridging, to let the INR slowly trended up to therapeutic range -Stable, improved  LV thrombus -In the  background of ischemic cardiomyopathy and recent MI -fortunately recent echo did not indicate any residual thrombus -Cardiology following, see discussion regarding anticoagulation above  History of recent STEMI -LAD stenosis status post PCI and drug-eluting stent in 01/2018 -On Plavix at baseline, this was temporarily held, then restarted, and held again 1/14 -will restart this at discharge, see discussion above  Ischemic cardiomyopathy Secondary to STEMI. Current EF of 35-40% from Transthoracic Echocardiogram on 12/27.  -Currently on lasix as needed and low dose spironolactone -Has remained euvolemic throughout this hospitalization  -Resumed Aldactone 2.5 mg daily and Lasix as needed at discharge  Essential hypertension Patient is on Coreg, losartan and spironolactone as an outpatient. -Continue Coreg, restarted diuretics at discharge -Due to soft BPs this admission losartan was discontinued -Monitor/reassess at follow-up  History of Crohn's disease -On prednisone for many years and Biologics every 8 weeks, last dose was day of admission -Continue home regimen of prednisone  BPH -Continue Flomax  Pericarditis post MI -Continue colchicine  CKD stage III Stable.  Hyperlipidemia -Continue Lipitor  GERD -Continue Protonix  Consultations:  [facility]  [place] [facility]  Discharge Exam: Vitals:   04/14/18 0734 04/14/18 1313  BP: 134/83 (!) 124/93  Pulse: (!) 56 69  Resp: 15 14  Temp: 97.9 F (36.6 C) 98.4 F (36.9 C)  SpO2: 97% 95%    General: AAOx3 Cardiovascular: S1S2/RRR Respiratory: CTAB  Discharge Instructions    Allergies as of 04/14/2018      Reactions   Cephalexin Nausea And Vomiting   Other Reaction: GI UPSET   Duloxetine Other (See Comments)   Headaches      Medication List    STOP taking these medications   losartan 25 MG tablet Commonly known  as:  COZAAR     TAKE these medications   acetaminophen 325 MG  tablet Commonly known as:  TYLENOL Take 650 mg by mouth every 6 (six) hours as needed for mild pain or headache.   alendronate 70 MG tablet Commonly known as:  FOSAMAX Take 70 mg by mouth every Saturday.   atorvastatin 80 MG tablet Commonly known as:  LIPITOR Take 1 tablet (80 mg total) by mouth daily at 6 PM.   CALCIUM 1200 PO Take 1 capsule by mouth 2 (two) times daily.   carvedilol 3.125 MG tablet Commonly known as:  COREG Take 1 tablet (3.125 mg total) by mouth 2 (two) times daily with a meal.   clopidogrel 75 MG tablet Commonly known as:  PLAVIX Take 1 tablet (75 mg total) by mouth daily.   colchicine 0.6 MG tablet Take 1 tablet (0.6 mg total) by mouth daily.   diphenoxylate-atropine 2.5-0.025 MG tablet Commonly known as:  LOMOTIL Take 1-2 tablets by mouth 4 times daily as needed for diarrhea   ENTYVIO 300 MG injection Generic drug:  vedolizumab Inject 300 mg into the vein every 8 (eight) weeks.   furosemide 20 MG tablet Commonly known as:  LASIX Take only as needed   gabapentin 300 MG capsule Commonly known as:  NEURONTIN Take one capsule TID What changed:    how much to take  how to take this  when to take this  additional instructions   hydrOXYzine 25 MG tablet Commonly known as:  ATARAX/VISTARIL TAKE 4 TABLETS (100 MG TOTAL) BY MOUTH AT BEDTIME.   multivitamin capsule Take 1 capsule by mouth daily.   nitroGLYCERIN 0.4 MG SL tablet Commonly known as:  NITROSTAT Place 1 tablet (0.4 mg total) under the tongue every 5 (five) minutes as needed for chest pain.   pantoprazole 40 MG tablet Commonly known as:  PROTONIX Take 1 tablet (40 mg total) by mouth 2 (two) times daily.   predniSONE 5 MG tablet Commonly known as:  DELTASONE Take 5 mg by mouth See admin instructions. Take 5 mg by mouth along with 14m and 121mtablet to equal 1763motal.   predniSONE 1 MG tablet Commonly known as:  DELTASONE Take 2 mg by mouth See admin instructions. Take 2  mg by mouth along with 68m28md 5 mg tablet to equal 17mg54mal.   predniSONE 10 MG tablet Commonly known as:  DELTASONE Take 10 mg by mouth See admin instructions. Take 10 by mouth along with 2mg a88m5 mg tablet to equal 17mg t25m.   spironolactone 25 MG tablet Commonly known as:  ALDACTONE Take 0.5 tablets (12.5 mg total) by mouth daily.   tamsulosin 0.4 MG Caps capsule Commonly known as:  FLOMAX TAKE 1 CAPSULE (0.4 MG TOTAL) BY MOUTH DAILY.   warfarin 5 MG tablet Commonly known as:  COUMADIN Take as directed. If you are unsure how to take this medication, talk to your nurse or doctor. Original instructions:  Take 5 mg tonight and tomorrow night and then back to home regimen of 5 mg daily on Monday Wednesday Friday and Sunday and 2.5 mg daily on Tuesday Thursday Saturday. -INR check is on Friday 1/17 What changed:  additional instructions      Allergies  Allergen Reactions  . Cephalexin Nausea And Vomiting    Other Reaction: GI UPSET  . Duloxetine Other (See Comments)    Headaches    Follow-up Information    GutierrRia Bushchedule an appointment as soon  as possible for a visit in 1 week(s).   Specialty:  Family Medicine Contact information: [place] [address] [phone]        [place] [facility] Follow up on 04/16/2018.   Specialty:  Cardiology Why:  at 8:45 for INR check Contact information: [address], [address] [place] [phone]       Charlie Pitter, PA-C Follow up on 04/20/2018.   Specialties:  Cardiology, Radiology Why:  9am Contact information: [address] [place] [address] [phone]            The results of significant diagnostics from this hospitalization (including imaging, microbiology, ancillary and laboratory) are listed below for reference.    Significant Diagnostic Studies: No results found.  Microbiology: No results  found for this or any previous visit (from the past 240 hour(s)).   Labs: Basic Metabolic Panel: Recent Labs  Lab 04/07/18 1533 04/08/18 0310 04/10/18 0235 04/12/18 0839 04/12/18 1400 04/14/18 0313  NA 135 139 138 141  --  140  K 4.7 4.2 3.4* 4.5  --  4.1  CL 106 105 106 105  --  105  CO2 21* 27 24 29   --  24  GLUCOSE 115* 89 109* 115*  --  87  BUN 29* 20 18 20   --  23*  CREATININE 1.27* 1.31* 1.44* 1.34*  --  1.27*  CALCIUM 9.0 8.3* 8.0* 8.5*  --  8.8*  MG  --   --   --   --  2.1  --    Liver Function Tests: Recent Labs  Lab 04/07/18 1533  AST 25  ALT 32  ALKPHOS 39  BILITOT 1.0  PROT 6.5  ALBUMIN 3.8   No results for input(s): LIPASE, AMYLASE in the last 168 hours. No results for input(s): AMMONIA in the last 168 hours. CBC: Recent Labs  Lab 04/11/18 0317 04/12/18 0839 04/12/18 1400 04/13/18 0300 04/14/18 0313  WBC 12.9* 14.0* 14.0* 14.0* 14.5*  HGB 11.4* 12.2* 13.1 11.4* 11.6*  HCT 35.2* 38.5* 40.8 35.9* 36.3*  MCV 90.7 91.4 91.3 89.8 90.5  PLT 258 293 314 263 270   Cardiac Enzymes: No results for input(s): CKTOTAL, CKMB, CKMBINDEX, TROPONINI in the last 168 hours. BNP: BNP (last 3 results) Recent Labs    02/20/18 2331  BNP 14.9    ProBNP (last 3 results) Recent Labs    03/18/18 1524 03/22/18 1140  PROBNP 2,934* 2,573*    CBG: No results for input(s): GLUCAP in the last 168 hours.     Signed:  Domenic Polite MD.  [facility] 04/14/2018, 2:23 PM

## 2018-04-14 NOTE — Progress Notes (Signed)
ANTICOAGULATION CONSULT NOTE - Follow Up Consult  Pharmacy Consult for heparin Indication: LV thrombus  Labs: Recent Labs    04/12/18 0839 04/12/18 1400  04/12/18 2350 04/13/18 0300 04/13/18 0900 04/13/18 1914 04/14/18 0313  HGB 12.2* 13.1  --   --  11.4*  --   --  11.6*  HCT 38.5* 40.8  --   --  35.9*  --   --  36.3*  PLT 293 314  --   --  263  --   --  270  LABPROT 14.5  --   --  13.7  --  15.3*  --  15.1  INR 1.14  --   --  1.06  --  1.22  --  1.20  HEPARINUNFRC 0.62  --    < > 0.51  --  0.52 0.28* 0.36  CREATININE 1.34*  --   --   --   --   --   --  1.27*   < > = values in this interval not displayed.    Assessment: 53yo male with history of LV thrombus on warfarin PTA. Anticoagulation was held on admission for colonoscopy done 1/10. Hep bridge + Warf resumed 1/12 - then held again 1/14 AM due to bleeding, but resumed in the evening. Noted plans for flexible sigmoidoscopy tomorrow 1/15.  Heparin level this morning is therapeutic after a slight increase yesterday evening (HL 0.36 << 0.28, goal of 0.3-0.5). CBC stable from 1/14 labs and flex sig planned for evaluation of bleeding.   Goal of Therapy:  Heparin level 0.3-0.5 units/ml   Plan:  - Continue Heparin at 800 units/hr (8 ml/hr) - Will follow-up with GI on plans to hold prior to flex sig and plans to resume anticoagulation (warf, plavix, hep) post-op - Will continue to monitor for any signs/symptoms of bleeding and will follow up with heparin level in 6 hours vs post-procedure  Thank you for allowing pharmacy to be a part of this patient's care.  Alycia Rossetti, PharmD, BCPS Clinical Pharmacist Clinical phone for 04/14/2018: [phone] 04/14/2018 9:13 AM   **Pharmacist phone directory can now be found on [email or web address] ([email or web address]).  Listed under [place].

## 2018-04-14 NOTE — Progress Notes (Signed)
Patient is discharged to from the unit. Patient provided with discharged packet. Packet was reviewed with patient and SO. They report no further questions.

## 2018-04-14 NOTE — Interval H&P Note (Signed)
History and Physical Interval Note:  04/14/2018 1:36 PM  Jack [name]  has presented today for surgery, with the diagnosis of hematochezia.  recent rectal polypectomy.  The various methods of treatment have been discussed with the patient and family. After consideration of risks, benefits and other options for treatment, the patient has consented to  Procedure(s): FLEXIBLE SIGMOIDOSCOPY (N/A) as a surgical intervention .  The patient's history has been reviewed, patient examined, no change in status, stable for surgery.  I have reviewed the patient's chart and labs.  Questions were answered to the patient's satisfaction.     Milus Banister

## 2018-04-14 NOTE — Plan of Care (Signed)
  Problem: Education: Goal: Knowledge of General Education information will improve Description Including pain rating scale, medication(s)/side effects and non-pharmacologic comfort measures Outcome: Progressing   Problem: Clinical Measurements: Goal: Ability to maintain clinical measurements within normal limits will improve Outcome: Progressing   Problem: Clinical Measurements: Goal: Diagnostic test results will improve Outcome: Progressing   Problem: Clinical Measurements: Goal: Cardiovascular complication will be avoided Outcome: Progressing   Problem: Safety: Goal: Ability to remain free from injury will improve Outcome: Progressing   Problem: Education: Goal: Ability to identify signs and symptoms of gastrointestinal bleeding will improve Outcome: Progressing   Problem: Fluid Volume: Goal: Will show no signs and symptoms of excessive bleeding Outcome: Progressing

## 2018-04-15 ENCOUNTER — Ambulatory Visit: Payer: BLUE CROSS/BLUE SHIELD | Admitting: Cardiology

## 2018-04-15 ENCOUNTER — Other Ambulatory Visit: Payer: BLUE CROSS/BLUE SHIELD

## 2018-04-16 ENCOUNTER — Telehealth (HOSPITAL_COMMUNITY): Payer: Self-pay

## 2018-04-16 ENCOUNTER — Telehealth: Payer: Self-pay

## 2018-04-16 ENCOUNTER — Ambulatory Visit (INDEPENDENT_AMBULATORY_CARE_PROVIDER_SITE_OTHER): Payer: BLUE CROSS/BLUE SHIELD | Admitting: Pharmacist

## 2018-04-16 DIAGNOSIS — Z5181 Encounter for therapeutic drug level monitoring: Secondary | ICD-10-CM | POA: Diagnosis not present

## 2018-04-16 DIAGNOSIS — Z7901 Long term (current) use of anticoagulants: Secondary | ICD-10-CM | POA: Diagnosis not present

## 2018-04-16 DIAGNOSIS — I236 Thrombosis of atrium, auricular appendage, and ventricle as current complications following acute myocardial infarction: Secondary | ICD-10-CM | POA: Diagnosis not present

## 2018-04-16 LAB — POCT INR: INR: 1.1 — AB (ref 2.0–3.0)

## 2018-04-16 NOTE — Telephone Encounter (Signed)
Not eligible for TCM call due to insurance. Appointment made for follow up on 04/21/2018 with Dr. Danise Mina at 12 pm

## 2018-04-16 NOTE — Patient Instructions (Addendum)
Description   Today take 1.5 tablets today (take an extra 1/2 tablet since already took 1 tablet today) then continue taking 1 tablet daily except 1/2 tablet on Tuesdays, Thursdays and Saturdays.  Recheck INR in 1 week.  Coumadin Clinic [phone] Main [phone]

## 2018-04-19 ENCOUNTER — Encounter: Payer: Self-pay | Admitting: Physician Assistant

## 2018-04-19 NOTE — Progress Notes (Signed)
 Cardiology Office Note    Date:  04/20/2018  ID:  Jack [name], DOB 10/19/1965, MRN [id number] PCP:  Gutierrez, Javier, MD  Cardiologist:  Henry W Smith III, MD   Chief Complaint: f/u rectal bleeding  History of Present Illness:  Jack [name] is a 52 y.o. male with history of systemic vasculitis, eosinophilic granulomatosis with polyangitis, Crohn's disease, essential hypertension, sciatica, chronic leg pain, probable mild CKD II-III and recent complex admission for CAD/anterior STEMI with acute systolic CHF/ischemic cardiomyopathy, LV thrombus, and post-MI pericarditis. He was admitted 02/20/18 with severe crushing chest discomfort with associated nausea and diaphoresis. The pain started about 5 hours after he took Cialis and while he was engaged in intercourse. In retrospect they recalled the day prior he experienced exacerbation of his chronic leg pain which was followed by intense SOB for a period of time. On 02/20/18, EMS EKG demonstrated anterior STEMI. Emergent cardiac catheterization which showed proximal occlusion of LAD, treated with DES. Cath also showed 20% distal LM, 95% ostial-prox small-moderate ramus, 70-80% ostial Cx, 70% dominant ostial OM, 50-60% prox Cx, 70% RCA. EF 35% by cath with LVEDP 24mmHg. Medical therapy was recommended for his residual disease. He did go back for relook catheterization on 02/21/2018 due to recurrent CP which showed stable anatomy with patent stent in LAD. Treatment for suspected pericarditis was recommended. He was started on colchicine for 3 months. He was initially started on dual antiplatelet therapy with Aspirin and Brilinta but this was changed to Plavix after he was found to have LV thrombus requiring heparin -> Coumadin bridge. His 2D echo 02/22/18 had shown EF 35-40%, multiple wall motion abnormalities, normal RV, no pericardial effusion but probable LV apical thrombus - Dr. Varanasi personally reviewed the echo film and felt his EF was  more likely 30-35%, therefore LifeVest was recommended. He was discharged with Lovenox->Coumadin bridge. Colchicine was reduced to daily given his concomitant statin interaction. He had previously been discharged with Lasix PRN but they thought it was supposed to be taken regularly. BMET at that time showed AKI with elevated BUN/Cr so this was stopped. He was readmitted to [facility] 12/7-12/9 for BRBPR and stable Hgb. GI there did not feel endoscopic eval was warranted. Cardiology team saw him and discontinued ASA. Repeat echo 03/26/18 showed EF 35-40%, no mention of LV thrombus. Dr. Smith reviewed and felt he was OK to d/c LifeVest. He was recently readmitted to [facility] 1/8-1/17 with recurrent hematochezia. He was seen by GI at [facility] and colonoscopy was performed. Rectal polyp was removed, injected and clipped. Repeat flex sig showed no further bleeding but additional clip was placed at prior polypectomy site. He was felt safe to resume Plavix and Coumadin (the latter to continue for 3 months total post MI). Losartan was stopped due to soft BP. DC labs showed Hgb 11.6, Cr 1.27, K 4.1.  He returns for follow-up overall feeling great. No CP, SOB, orthopnea, edema, fatigue. He has not had any recurrent rectal bleeding. He does not follow his BP at home. Has f/u PCP tomorrow. Coumadin clinic is scheduled for Thurs & Fri - Wants to know if he can get checked while he's here.   Past Medical History:  Diagnosis Date  . BPH (benign prostatic hypertrophy)   . CAD (coronary artery disease)    a. anterior STEMI 01/2018 -  proximal occlusion of LAD, treated with DES. Cath also showed 20% distal LM, 95% ostial-prox small-moderate ramus, 70-80% ostial Cx, 70% dominant ostial OM, 50-60% prox   Cx, 70% RCA. EF 35% by cath with LVEDP 24mmHg. Med rx for residual disease. Course complicated by post MI pericarditis and LV thrombus.  . Chronic systolic (congestive) heart failure (HCC)   . Colitis   . Colon polyp    inflammatory  .  Crohn disease (HCC) 1992   history uveitis, involvement of intestines and lungs  . Diverticulosis   . Eosinophilic granuloma (HCC)   . Essential hypertension   . GERD (gastroesophageal reflux disease)   . GI bleeding   . History of chicken pox   . History of gastroesophageal reflux (GERD)   . History of seizure 1995   grand mal x1, completed 6 yrs dilantin. no seizures since  . Hyperlipidemia   . Internal hemorrhoids   . Ischemic cardiomyopathy   . LV (left ventricular) mural thrombus following MI (HCC) 01/2018  . Osteoporosis 11/2015   DEXA T -2.9  . Schwannoma 2007   L axilla s/p surgery  . Seizures (HCC)    last seizure 1995 and only x 1 seizure-   . Ulnar neuropathy    h/o this from L arm schwannoma    Past Surgical History:  Procedure Laterality Date  . APPENDECTOMY  2000  . CARDIOVASCULAR STRESS TEST  01/2015   low risk study  . CHOLECYSTECTOMY  2000  . COLONOSCOPY  08/2014   f/u crohn's, 4 inflammatory polyps, diverticulosis, improved, rpt 2 yrs ([facility])  . COLONOSCOPY WITH PROPOFOL N/A 04/09/2018   Procedure: COLONOSCOPY WITH PROPOFOL Hot Biopsy;  Surgeon: Danis, Henry L III, MD;  Location: [facility];  Service: Gastroenterology;  Laterality: N/A;  . CORONARY ANGIOGRAPHY N/A 02/21/2018   Procedure: CORONARY ANGIOGRAPHY;  Surgeon: Smith, Henry W, MD;  Location: [facility] INVASIVE CV LAB;  Service: Cardiovascular;  Laterality: N/A;  . CORONARY/GRAFT ACUTE MI REVASCULARIZATION N/A 02/20/2018   Procedure: Coronary/Graft Acute MI Revascularization;  Surgeon: Smith, Henry W, MD;  Location: [facility] CV [facility];  Service: Cardiovascular;  Laterality: N/A;  . ESOPHAGOGASTRODUODENOSCOPY  07/2014   h/o EE resolved, focal reflux esophagitis, chronic active gastritis  . extremity surgery Left    ulnar nerve repair after schwannoma removal  . FLEXIBLE SIGMOIDOSCOPY N/A 04/14/2018   Procedure: FLEXIBLE SIGMOIDOSCOPY;  Surgeon: Jacobs, Daniel P, MD;  Location: [facility];  Service:  Endoscopy;  Laterality: N/A;  . INGUINAL HERNIA REPAIR Bilateral 2017  . LEFT HEART CATH AND CORONARY ANGIOGRAPHY N/A 02/20/2018   Procedure: LEFT HEART CATH AND CORONARY ANGIOGRAPHY;  Surgeon: Smith, Henry W, MD;  Location: [facility];  Service: Cardiovascular;  Laterality: N/A;  . LUMBAR SPINE SURGERY  2011  . POLYPECTOMY  04/09/2018   Procedure: POLYPECTOMY;  Surgeon: Danis, Henry L III, MD;  Location: [facility];  Service: Gastroenterology;;  . SPINAL CORD STIMULATOR IMPLANT  2012   x2 (Dr Grassi)  . SUBMUCOSAL INJECTION  04/09/2018   Procedure: SUBMUCOSAL INJECTION;  Surgeon: Danis, Henry L III, MD;  Location: [facility];  Service: Gastroenterology;;  . TONSILLECTOMY  1996  . TUMOR REMOVAL Left 2007   schwannoma from L armpit  . UPPER GASTROINTESTINAL ENDOSCOPY      Current Medications: Current Meds  Medication Sig  . acetaminophen (TYLENOL) 325 MG tablet Take 650 mg by mouth every 6 (six) hours as needed for mild pain or headache.   . alendronate (FOSAMAX) 70 MG tablet Take 70 mg by mouth every Saturday.   . atorvastatin (LIPITOR) 80 MG tablet Take 1 tablet (80 mg total) by mouth daily at 6 PM.  .   Calcium Carbonate-Vit D-Min (CALCIUM 1200 PO) Take 1 capsule by mouth 2 (two) times daily.  . carvedilol (COREG) 3.125 MG tablet Take 1 tablet (3.125 mg total) by mouth 2 (two) times daily with a meal.  . clopidogrel (PLAVIX) 75 MG tablet Take 1 tablet (75 mg total) by mouth daily.  . colchicine 0.6 MG tablet Take 1 tablet (0.6 mg total) by mouth daily.  . diphenoxylate-atropine (LOMOTIL) 2.5-0.025 MG tablet Take 1-2 tablets by mouth 4 times daily as needed for diarrhea  . furosemide (LASIX) 20 MG tablet Take only as needed  . gabapentin (NEURONTIN) 300 MG capsule Take one capsule TID  . hydrOXYzine (ATARAX/VISTARIL) 25 MG tablet TAKE 4 TABLETS (100 MG TOTAL) BY MOUTH AT BEDTIME.  . Multiple Vitamin (MULTIVITAMIN) capsule Take 1 capsule by mouth daily.  . nitroGLYCERIN  (NITROSTAT) 0.4 MG SL tablet Place 1 tablet (0.4 mg total) under the tongue every 5 (five) minutes as needed for chest pain.  . pantoprazole (PROTONIX) 40 MG tablet Take 1 tablet (40 mg total) by mouth 2 (two) times daily.  . predniSONE (DELTASONE) 1 MG tablet Take 2 mg by mouth See admin instructions. Take 2 mg by mouth along with 10mg and 5 mg tablet to equal 17mg total.  . predniSONE (DELTASONE) 10 MG tablet Take 10 mg by mouth See admin instructions. Take 10 by mouth along with 2mg and 5 mg tablet to equal 17mg total.  . predniSONE (DELTASONE) 5 MG tablet Take 5 mg by mouth See admin instructions. Take 5 mg by mouth along with 2mg and 10mg tablet to equal 17mg total.  . spironolactone (ALDACTONE) 25 MG tablet Take 0.5 tablets (12.5 mg total) by mouth daily.  . tamsulosin (FLOMAX) 0.4 MG CAPS capsule TAKE 1 CAPSULE (0.4 MG TOTAL) BY MOUTH DAILY.  . vedolizumab (ENTYVIO) 300 MG injection Inject 300 mg into the vein every 8 (eight) weeks.   . warfarin (COUMADIN) 5 MG tablet Take 5 mg tonight and tomorrow night and then back to home regimen of 5 mg daily on Monday Wednesday Friday and Sunday and 2.5 mg daily on Tuesday Thursday Saturday. -INR check is on Friday 1/17      Allergies:   Cephalexin and Duloxetine   Social History   Socioeconomic History  . Marital status: Married    Spouse name: Not on file  . Number of children: Not on file  . Years of education: Not on file  . Highest education level: Not on file  Occupational History  . Occupation: Self-employed    Comment: Patient owns 2 mattress stores  Social Needs  . Financial resource strain: Not on file  . Food insecurity:    Worry: Not on file    Inability: Not on file  . Transportation needs:    Medical: Not on file    Non-medical: Not on file  Tobacco Use  . Smoking status: Former Smoker    Types: Cigars    Last attempt to quit: 02/20/2018    Years since quitting: 0.1  . Smokeless tobacco: Never Used  Substance and  Sexual Activity  . Alcohol use: Yes    Alcohol/week: 7.0 standard drinks    Types: 7 Cans of beer per week    Comment: 1 beer/day  . Drug use: No  . Sexual activity: Not on file  Lifestyle  . Physical activity:    Days per week: Not on file    Minutes per session: Not on file  . Stress: Not on file    Relationships  . Social connections:    Talks on phone: Not on file    Gets together: Not on file    Attends religious service: Not on file    Active member of club or organization: Not on file    Attends meetings of clubs or organizations: Not on file    Relationship status: Not on file  Other Topics Concern  . Not on file  Social History Narrative   Lives with wife, 1 dog. Grown children   Edu: college   Occ: owns mattress store   Activity: active at work, started crossfit, wants to restart running   Diet: good water, fruits/vegetables daily     Family History:  The patient's family history includes CAD (age of onset: 70) in his father and mother; Cancer in his maternal grandmother; Colon cancer in his maternal grandfather; Congenital heart disease in his mother; Hyperlipidemia in his father and mother; Hypertension in his father and mother. There is no history of Diabetes, Colon polyps, Esophageal cancer, Rectal cancer, or Stomach cancer.  ROS:   Please see the history of present illness.   All other systems are reviewed and otherwise negative.    PHYSICAL EXAM:   VS:  BP 120/78   Pulse 72   Ht 5' 6.5" (1.689 m)   Wt 169 lb 12.8 oz (77 kg)   SpO2 97%   BMI 27.00 kg/m   BMI: Body mass index is 27 kg/m. GEN: Well nourished, well developed WM, in no acute distress HEENT: normocephalic, atraumatic Neck: no JVD, carotid bruits, or masses Cardiac: RRR; no murmurs, rubs, or gallops, no edema  Respiratory:  clear to auscultation bilaterally, normal work of breathing GI: soft, nontender, nondistended, + BS MS: no deformity or atrophy Skin: warm and dry, no rash Neuro:   Alert and Oriented x 3, Strength and sensation are intact, follows commands Psych: euthymic mood, full affect  Wt Readings from Last 3 Encounters:  04/20/18 169 lb 12.8 oz (77 kg)  04/07/18 165 lb (74.8 kg)  03/22/18 173 lb 8 oz (78.7 kg)      Studies/Labs Reviewed:   EKG:   EKG was not ordered today.  Recent Labs: 02/20/2018: B Natriuretic Peptide 14.9; TSH 1.467 03/22/2018: NT-Pro BNP 2,573 04/07/2018: ALT 32 04/12/2018: Magnesium 2.1 04/14/2018: BUN 23; Creatinine, Ser 1.27; Hemoglobin 11.6; Platelets 270; Potassium 4.1; Sodium 140   Lipid Panel    Component Value Date/Time   CHOL 224 (H) 02/20/2018 2345   TRIG 40 02/20/2018 2345   HDL 50 02/20/2018 2345   CHOLHDL 4.5 02/20/2018 2345   VLDL 8 02/20/2018 2345   LDLCALC 166 (H) 02/20/2018 2345    Additional studies/ records that were reviewed today include: Summarized above   ASSESSMENT & PLAN:   1. CAD - ASA stopped previously due to rectal bleeding. No recurrent angina. Continue present regimen. 2. Post MI Pericarditis - he continues on colchicine (only once daily given statin use) - I advised he can stop this 05/27/18, completing 3 months of therapy. 3. ICM with LV thrombus and chronic systolic CHF - appears euvolemic. His BP is normal but has a tendency to lower BP in the past with orthostatic type symptoms. Restart losartan. I am not confident he would tolerate Entresto but this can be considered in the future if BP tolerates resumption of low dose ARB. I anticipate he will continue Coumadin for 3 months post MI but would like him to see Dr. Smith end of February to make that   final decision. He asks if we can get INR with labs today. Will obtain and plan to route to Coumadin clinic. F/u CBC to ensure stability. We will continue plans to obtain echocardiogram end of Feb to assess 3 month progress. 4. Hyperlipidemia - was due for lipids this month, not sure what happened to that appt. Will obtain today with  CMET.  Disposition: F/u with Dr. Tamala Julian end of next month 05/2018. This needs to be MD appointment to get re-aquainted as he only really met Dr. Tamala Julian in setting of acute MI.  Medication Adjustments/Labs and Tests Ordered: Current medicines are reviewed at length with the patient today.  Concerns regarding medicines are outlined above. Medication changes, Labs and Tests ordered today are summarized above and listed in the Patient Instructions accessible in Encounters.   Signed, Charlie Pitter, PA-C  04/20/2018 9:16 AM    [facility] [address], [place], [place]  [address] Phone: [phone]; Fax: [phone]

## 2018-04-20 ENCOUNTER — Ambulatory Visit: Payer: BLUE CROSS/BLUE SHIELD | Admitting: Physician Assistant

## 2018-04-20 ENCOUNTER — Encounter: Payer: Self-pay | Admitting: Physician Assistant

## 2018-04-20 VITALS — BP 120/78 | HR 72 | Ht 66.5 in | Wt 169.8 lb

## 2018-04-20 DIAGNOSIS — I255 Ischemic cardiomyopathy: Secondary | ICD-10-CM | POA: Diagnosis not present

## 2018-04-20 DIAGNOSIS — I2510 Atherosclerotic heart disease of native coronary artery without angina pectoris: Secondary | ICD-10-CM | POA: Diagnosis not present

## 2018-04-20 DIAGNOSIS — I241 Dressler's syndrome: Secondary | ICD-10-CM

## 2018-04-20 DIAGNOSIS — E785 Hyperlipidemia, unspecified: Secondary | ICD-10-CM

## 2018-04-20 DIAGNOSIS — I5022 Chronic systolic (congestive) heart failure: Secondary | ICD-10-CM

## 2018-04-20 DIAGNOSIS — I236 Thrombosis of atrium, auricular appendage, and ventricle as current complications following acute myocardial infarction: Secondary | ICD-10-CM | POA: Diagnosis not present

## 2018-04-20 NOTE — Patient Instructions (Addendum)
Medication Instructions:  Your physician has recommended you make the following change in your medication:  1.  RESUME the Losartan 25 mg taking 1 daily  If you need a refill on your cardiac medications before your next appointment, please call your pharmacy.   Lab work: TODAY:  CMET, LIPID, CBC, & PT/INR  If you have labs (blood work) drawn today and your tests are completely normal, you will receive your results only by: [name] MyChart Message (if you have MyChart) OR . A paper copy in the mail If you have any lab test that is abnormal or we need to change your treatment, we will call you to review the results.  Testing/Procedures: None ordered  Follow-Up: Your physician recommends that you schedule a follow-up appointment in: 05/27/2018 YOU WILL SEE DR. Tamala Julian AFTER YOUR ECHOCARDIOGRAM @ 11:40    Any Other Special Instructions Will Be Listed Below (If Applicable).

## 2018-04-21 ENCOUNTER — Ambulatory Visit (INDEPENDENT_AMBULATORY_CARE_PROVIDER_SITE_OTHER): Payer: BLUE CROSS/BLUE SHIELD | Admitting: Pharmacist

## 2018-04-21 ENCOUNTER — Other Ambulatory Visit: Payer: Self-pay | Admitting: *Deleted

## 2018-04-21 ENCOUNTER — Inpatient Hospital Stay: Payer: BLUE CROSS/BLUE SHIELD | Admitting: Family Medicine

## 2018-04-21 DIAGNOSIS — Z5181 Encounter for therapeutic drug level monitoring: Secondary | ICD-10-CM | POA: Diagnosis not present

## 2018-04-21 DIAGNOSIS — Z7901 Long term (current) use of anticoagulants: Secondary | ICD-10-CM | POA: Diagnosis not present

## 2018-04-21 DIAGNOSIS — Z6826 Body mass index (BMI) 26.0-26.9, adult: Secondary | ICD-10-CM | POA: Diagnosis not present

## 2018-04-21 DIAGNOSIS — I236 Thrombosis of atrium, auricular appendage, and ventricle as current complications following acute myocardial infarction: Secondary | ICD-10-CM

## 2018-04-21 DIAGNOSIS — G9619 Other disorders of meninges, not elsewhere classified: Secondary | ICD-10-CM | POA: Diagnosis not present

## 2018-04-21 DIAGNOSIS — M79604 Pain in right leg: Secondary | ICD-10-CM | POA: Diagnosis not present

## 2018-04-21 DIAGNOSIS — M79605 Pain in left leg: Secondary | ICD-10-CM | POA: Diagnosis not present

## 2018-04-21 DIAGNOSIS — I2510 Atherosclerotic heart disease of native coronary artery without angina pectoris: Secondary | ICD-10-CM

## 2018-04-21 LAB — LIPID PANEL
Chol/HDL Ratio: 4.3 ratio (ref 0.0–5.0)
Cholesterol, Total: 165 mg/dL (ref 100–199)
HDL: 38 mg/dL — ABNORMAL LOW (ref >39)
LDL Calculated: 92 mg/dL (ref 0–99)
Triglycerides: 175 mg/dL — ABNORMAL HIGH (ref 0–149)
VLDL Cholesterol Cal: 35 mg/dL (ref 5–40)

## 2018-04-21 LAB — CBC
Hematocrit: 39.6 % (ref 37.5–51.0)
Hemoglobin: 13.2 g/dL (ref 13.0–17.7)
MCH: 28.6 pg (ref 26.6–33.0)
MCHC: 33.3 g/dL (ref 31.5–35.7)
MCV: 86 fL (ref 79–97)
PLATELETS: 338 10*3/uL (ref 150–450)
RBC: 4.62 x10E6/uL (ref 4.14–5.80)
RDW: 12.1 % (ref 11.6–15.4)
WBC: 13.8 10*3/uL — ABNORMAL HIGH (ref 3.4–10.8)

## 2018-04-21 LAB — BASIC METABOLIC PANEL
BUN/Creatinine Ratio: 23 — ABNORMAL HIGH (ref 9–20)
BUN: 32 mg/dL — ABNORMAL HIGH (ref 6–24)
CO2: 24 mmol/L (ref 20–29)
Calcium: 9.4 mg/dL (ref 8.7–10.2)
Chloride: 101 mmol/L (ref 96–106)
Creatinine, Ser: 1.42 mg/dL — ABNORMAL HIGH (ref 0.76–1.27)
GFR calc Af Amer: 66 mL/min/{1.73_m2} (ref >59)
GFR calc non Af Amer: 57 mL/min/{1.73_m2} — ABNORMAL LOW (ref >59)
Glucose: 85 mg/dL (ref 65–99)
Potassium: 4.6 mmol/L (ref 3.5–5.2)
Sodium: 140 mmol/L (ref 134–144)

## 2018-04-21 LAB — PROTIME-INR
INR: 2 — ABNORMAL HIGH (ref 0.8–1.2)
Prothrombin Time: 19.7 s — ABNORMAL HIGH (ref 9.1–12.0)

## 2018-04-21 NOTE — Patient Instructions (Signed)
Description   Spoke with pt and instructed pt to continue taking 1 tablet daily except 1/2 tablet on Tuesdays, Thursdays and Saturdays.  Recheck INR in 1 week.  Coumadin Clinic [phone] Main [phone]

## 2018-04-26 ENCOUNTER — Telehealth: Payer: Self-pay | Admitting: *Deleted

## 2018-04-26 ENCOUNTER — Inpatient Hospital Stay: Payer: BLUE CROSS/BLUE SHIELD | Admitting: Family Medicine

## 2018-04-26 ENCOUNTER — Encounter: Payer: Self-pay | Admitting: Family Medicine

## 2018-04-26 ENCOUNTER — Ambulatory Visit: Payer: BLUE CROSS/BLUE SHIELD | Admitting: Family Medicine

## 2018-04-26 VITALS — BP 120/72 | HR 70 | Temp 98.2°F | Ht 66.5 in | Wt 171.2 lb

## 2018-04-26 DIAGNOSIS — K625 Hemorrhage of anus and rectum: Secondary | ICD-10-CM

## 2018-04-26 DIAGNOSIS — K50811 Crohn's disease of both small and large intestine with rectal bleeding: Secondary | ICD-10-CM

## 2018-04-26 DIAGNOSIS — M79604 Pain in right leg: Secondary | ICD-10-CM | POA: Diagnosis not present

## 2018-04-26 DIAGNOSIS — I241 Dressler's syndrome: Secondary | ICD-10-CM

## 2018-04-26 DIAGNOSIS — M301 Polyarteritis with lung involvement [Churg-Strauss]: Secondary | ICD-10-CM

## 2018-04-26 DIAGNOSIS — I10 Essential (primary) hypertension: Secondary | ICD-10-CM

## 2018-04-26 DIAGNOSIS — I255 Ischemic cardiomyopathy: Secondary | ICD-10-CM | POA: Diagnosis not present

## 2018-04-26 DIAGNOSIS — R350 Frequency of micturition: Secondary | ICD-10-CM

## 2018-04-26 DIAGNOSIS — D7218 Eosinophilia in diseases classified elsewhere: Secondary | ICD-10-CM

## 2018-04-26 DIAGNOSIS — R972 Elevated prostate specific antigen [PSA]: Secondary | ICD-10-CM

## 2018-04-26 DIAGNOSIS — G8929 Other chronic pain: Secondary | ICD-10-CM

## 2018-04-26 DIAGNOSIS — D72829 Elevated white blood cell count, unspecified: Secondary | ICD-10-CM | POA: Diagnosis not present

## 2018-04-26 DIAGNOSIS — Z7901 Long term (current) use of anticoagulants: Secondary | ICD-10-CM

## 2018-04-26 DIAGNOSIS — N401 Enlarged prostate with lower urinary tract symptoms: Secondary | ICD-10-CM

## 2018-04-26 DIAGNOSIS — I236 Thrombosis of atrium, auricular appendage, and ventricle as current complications following acute myocardial infarction: Secondary | ICD-10-CM

## 2018-04-26 NOTE — Telephone Encounter (Signed)
   [facility] Pre-operative Risk Assessment    Request for surgical clearance:  1. What type of surgery is being performed? LUMBAR MYELOGRAM/CT   2. When is this surgery scheduled? TBD   3. What type of clearance is required (medical clearance vs. Pharmacy clearance to hold med vs. Both)? BOTH  4. Are there any medications that need to be held prior to surgery and how long?WARFARIN  5. Practice name and name of physician performing surgery? [facility]; DR. Kristeen Miss   6. What is your office phone number [phone]    7.   What is your office fax number [phone]  8.   Anesthesia type (None, local, MAC, general) ? VALIUM   Julaine Hua 04/26/2018, 12:19 PM  _________________________________________________________________   (provider comments below)

## 2018-04-26 NOTE — Assessment & Plan Note (Deleted)
Recent lower GI bleed from inflamed rectal polyp s/p colonoscopy and treatment. Bleeding has resolved. Back on plavix and coumadin. Will continue PPI BID.

## 2018-04-26 NOTE — Telephone Encounter (Signed)
I do not recommend bridging.

## 2018-04-26 NOTE — Assessment & Plan Note (Signed)
Recent lower GI bleed from inflamed rectal polyp s/p colonoscopy and treatment. Bleeding has resolved. Back on plavix and coumadin. Will continue PPI BID.

## 2018-04-26 NOTE — Assessment & Plan Note (Signed)
[facility] rheum has referred to [facility] pulm.

## 2018-04-26 NOTE — Assessment & Plan Note (Signed)
Chronic, stable. Continue current regimen. 

## 2018-04-26 NOTE — Progress Notes (Signed)
BP 120/72 (BP Location: Left Arm, Patient Position: Sitting, Cuff Size: Normal)   Pulse 70   Temp 98.2 F (36.8 C) (Oral)   Ht 5' 6.5" (1.689 m)   Wt 171 lb 4 oz (77.7 kg)   SpO2 98%   BMI 27.23 kg/m    CC: hosp f/u visit Subjective:    Patient ID: Jack [name], male    DOB: 06-06-1965, 53 y.o.   MRN: [id number]  HPI: Jack [name] is a 53 y.o. male presenting on 04/26/2018 for Hospitalization Follow-up   Recent hospitalization for ongoing hematochezia in known Crohn's. Found to have inflammatory rectal polyp. Coumadin and plavix were held, he had colonoscopy where polyp was removed. Had flex sig prior to discharge to ensure bleeding had subsided.   Known ICM with LV thrombus and chronic sCHF. Latest echo reassurignly without residual thrombus. S/p PCI with DES to LAD 01/2018.   Saw cardiology in follow up last week, note reviewed. Off aspirin due to rectal bleeding. Plan to stop colchicine 05/27/2018. Planning to f/u with Dr Tamala Julian end of February to decide on coumadin duration.   Since home, denies abd pain, chest pain, dyspnea, cough, fever, rectal bleeding. No blood in urine.  Since home, leg pain has improved - has not recently over done it.   Saw rheum Dr Nicola Girt (and [facility]) - ?eosinophilic granulomatosis with polyangiitis. Planning return to [facility] pulmonology for further recommendations. Currently undergoing prednisone taper 66m/month.    Chronic R calf pain presumed lumbar radiculopathy in origin, has been evaluated with venous [place] LE, lumbar MRI by sports medicine - currently managed with 3031m600mg gabapentin daily. Pain stays in calf - no back pain. Has 2 spinal cord stimulators in place to lower back. NSG f/u 03/2018 - planning further evaluation once he can hold blood thinner. Last visit planned consider TCA vs tramadol. Lyrica worsened irritability and caused anger issues  Admit date: 04/07/2018 Discharge date: 04/14/2018 TCM hosp f/u phone call not performed - pt  not eligible due to insurance.  Recommendations for Outpatient Follow-up:  Coumadin clinic on 1/17 for INR check Cardiology DaSharrell Kun 1/21 for hospital follow-up PCP in 1 week Gastroenterology Dr. PyHilarie Fredricksonn 1 month  Discharge Diagnoses:  Principal Problem:   Hematochezia   Inflammatory rectal polyp   Crohn disease ([facility]  Essential hypertension   Benign prostatic hyperplasia   LV (left ventricular) mural thrombus following MI ([facility]  Hyperlipidemia   Ischemic cardiomyopathy   GI bleed  Discharge Condition: Stable  Diet recommendation: Low-sodium, heart healthy     Relevant past medical, surgical, family and social history reviewed and updated as indicated. Interim medical history since our last visit reviewed. Allergies and medications reviewed and updated. Outpatient Medications Prior to Visit  Medication Sig Dispense Refill  . acetaminophen (TYLENOL) 325 MG tablet Take 650 mg by mouth every 6 (six) hours as needed for mild pain or headache.     . alendronate (FOSAMAX) 70 MG tablet Take 70 mg by mouth every Saturday.   11  . atorvastatin (LIPITOR) 80 MG tablet Take 1 tablet (80 mg total) by mouth daily at 6 PM. 30 tablet 3  . Calcium Carbonate-Vit D-Min (CALCIUM 1200 PO) Take 1 capsule by mouth 2 (two) times daily.    . carvedilol (COREG) 3.125 MG tablet Take 1 tablet (3.125 mg total) by mouth 2 (two) times daily with a meal. 60 tablet 3  . clopidogrel (PLAVIX) 75 MG tablet Take 1 tablet (75  mg total) by mouth daily. 30 tablet 12  . colchicine 0.6 MG tablet Take 1 tablet (0.6 mg total) by mouth daily. 90 tablet 3  . diphenoxylate-atropine (LOMOTIL) 2.5-0.025 MG tablet Take 1-2 tablets by mouth 4 times daily as needed for diarrhea 90 tablet 1  . furosemide (LASIX) 20 MG tablet Take only as needed 90 tablet 3  . gabapentin (NEURONTIN) 300 MG capsule Take one capsule TID 270 capsule 1  . hydrOXYzine (ATARAX/VISTARIL) 25 MG tablet TAKE 4 TABLETS (100 MG TOTAL) BY MOUTH AT  BEDTIME. 360 tablet 2  . losartan (COZAAR) 25 MG tablet Take 25 mg by mouth daily.    . Multiple Vitamin (MULTIVITAMIN) capsule Take 1 capsule by mouth daily.    . nitroGLYCERIN (NITROSTAT) 0.4 MG SL tablet Place 1 tablet (0.4 mg total) under the tongue every 5 (five) minutes as needed for chest pain. 25 tablet 12  . pantoprazole (PROTONIX) 40 MG tablet Take 1 tablet (40 mg total) by mouth 2 (two) times daily. 60 tablet 6  . predniSONE (DELTASONE) 1 MG tablet Take 2 mg by mouth See admin instructions. Take 2 mg by mouth along with 14m and 5 mg tablet to equal 166mtotal.    . predniSONE (DELTASONE) 10 MG tablet Take 10 mg by mouth See admin instructions. Take 10 by mouth along with 6m69mnd 5 mg tablet to equal 52m29mtal.    . predniSONE (DELTASONE) 5 MG tablet Take 5 mg by mouth See admin instructions. Take 5 mg by mouth along with 6mg 44m 10mg 77met to equal 52mg t45m.  2  . spironolactone (ALDACTONE) 25 MG tablet Take 0.5 tablets (12.5 mg total) by mouth daily. 90 tablet 3  . tamsulosin (FLOMAX) 0.4 MG CAPS capsule TAKE 1 CAPSULE (0.4 MG TOTAL) BY MOUTH DAILY. 30 capsule 11  . vedolizumab (ENTYVIO) 300 MG injection Inject 300 mg into the vein every 8 (eight) weeks.     . warfaMarland Kitchenin (COUMADIN) 5 MG tablet Take 5 mg tonight and tomorrow night and then back to home regimen of 5 mg daily on Monday Wednesday Friday and Sunday and 2.5 mg daily on Tuesday Thursday Saturday. -INR check is on Friday 1/17 1 tablet 0   No facility-administered medications prior to visit.      Per HPI unless specifically indicated in ROS section below Review of Systems Objective:    BP 120/72 (BP Location: Left Arm, Patient Position: Sitting, Cuff Size: Normal)   Pulse 70   Temp 98.2 F (36.8 C) (Oral)   Ht 5' 6.5" (1.689 m)   Wt 171 lb 4 oz (77.7 kg)   SpO2 98%   BMI 27.23 kg/m   Wt Readings from Last 3 Encounters:  04/26/18 171 lb 4 oz (77.7 kg)  04/20/18 169 lb 12.8 oz (77 kg)  04/07/18 165 lb (74.8 kg)      Physical Exam Vitals signs and nursing note reviewed.  Constitutional:      General: He is not in acute distress.    Appearance: Normal appearance. He is not ill-appearing.  HENT:     Mouth/Throat:     Mouth: Mucous membranes are moist.     Pharynx: Oropharynx is clear.  Cardiovascular:     Rate and Rhythm: Normal rate and regular rhythm.     Pulses: Normal pulses.     Heart sounds: Normal heart sounds. No murmur.  Pulmonary:     Effort: Pulmonary effort is normal. No respiratory distress.     Breath  sounds: Normal breath sounds. No wheezing, rhonchi or rales.  Musculoskeletal:     Right lower leg: No edema.     Left lower leg: No edema.  Neurological:     Mental Status: He is alert.  Psychiatric:        Mood and Affect: Mood normal.        Behavior: Behavior normal.       Results for orders placed or performed in visit on 22/97/98  Basic metabolic panel  Result Value Ref Range   Glucose 85 65 - 99 mg/dL   BUN 32 (H) 6 - 24 mg/dL   Creatinine, Ser 1.42 (H) 0.76 - 1.27 mg/dL   GFR calc non Af Amer 57 (L) >59 mL/min/1.73   GFR calc Af Amer 66 >59 mL/min/1.73   BUN/Creatinine Ratio 23 (H) 9 - 20   Sodium 140 134 - 144 mmol/L   Potassium 4.6 3.5 - 5.2 mmol/L   Chloride 101 96 - 106 mmol/L   CO2 24 20 - 29 mmol/L   Calcium 9.4 8.7 - 10.2 mg/dL  CBC  Result Value Ref Range   WBC 13.8 (H) 3.4 - 10.8 x10E3/uL   RBC 4.62 4.14 - 5.80 x10E6/uL   Hemoglobin 13.2 13.0 - 17.7 g/dL   Hematocrit 39.6 37.5 - 51.0 %   MCV 86 79 - 97 fL   MCH 28.6 26.6 - 33.0 pg   MCHC 33.3 31.5 - 35.7 g/dL   RDW 12.1 11.6 - 15.4 %   Platelets 338 150 - 450 x10E3/uL  Protime-INR ( [facility] ONLY)  Result Value Ref Range   INR 2.0 (H) 0.8 - 1.2   Prothrombin Time 19.7 (H) 9.1 - 12.0 sec  Lipid panel  Result Value Ref Range   Cholesterol, Total 165 100 - 199 mg/dL   Triglycerides 175 (H) 0 - 149 mg/dL   HDL 38 (L) >39 mg/dL   VLDL Cholesterol Cal 35 5 - 40 mg/dL   LDL Calculated 92 0 - 99  mg/dL   Chol/HDL Ratio 4.3 0.0 - 5.0 ratio   Assessment & Plan:   Problem List Items Addressed This Visit    Warfarin anticoagulation   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       GIB (gastrointestinal bleeding) - Primary    Recent lower GI bleed from inflamed rectal polyp s/p colonoscopy and treatment. Bleeding has resolved. Back on plavix and coumadin. Will continue PPI BID.       Essential hypertension    Chronic, stable. Continue current regimen.       Elevated PSA measurement    Again reviewed with patient as well as options of uro eval vs close monitoring in office with DRE/PSA. Will need this repeated at CPE in 4-6 months.       Crohn disease ([place])    Saw GI during latest hospitalization.       Churg-Strauss syndrome with lung involvement ([place])    [facility] rheum has referred to [facility] pulm.      Chronic leg pain    Stable period - will continue gabapentin at this time.  He saw neurosurgery with discussion of options for further evaluation, planning to defer while on blood thinners.  Discussed possible TCA vs tramadol in the future.       Benign prostatic  hyperplasia       No orders of the defined types were placed in this encounter.  No orders of the defined types were placed in this encounter.   Follow up plan: Return in about 4 months (around 08/25/2018) for annual exam, prior fasting for blood work.  [name], MD

## 2018-04-26 NOTE — Assessment & Plan Note (Signed)
>>  ASSESSMENT AND PLAN FOR ISCHEMIC CARDIOMYOPATHY WRITTEN ON 04/26/2018 10:42 AM BY [name], MD  Appreciate cards care.

## 2018-04-26 NOTE — Telephone Encounter (Signed)
Pt is taking warfarin for LV mural thrombus following MI February 20 2018 admission. Subsequent GI bleed 3 weeks ago on January 8th with therapeutic INR of 2.4, potentially due to diverticular bleeding.  Will need to hold warfarin for 5 days prior to procedure to ensure complete washout. Typically would bridge with Lovenox since pt is being treated for recent LV thrombus, however with recent GI bleed a few weeks ago with a therapeutic INR, will defer to MD regarding potential Lovenox bridge.

## 2018-04-26 NOTE — Assessment & Plan Note (Signed)
Again reviewed with patient as well as options of uro eval vs close monitoring in office with DRE/PSA. Will need this repeated at CPE in 4-6 months.

## 2018-04-26 NOTE — Assessment & Plan Note (Signed)
Saw GI during latest hospitalization.

## 2018-04-26 NOTE — Patient Instructions (Addendum)
Continue current medicines Continue colchicine through 05/27/2018.  Return in 4-6 months for physical.

## 2018-04-26 NOTE — Assessment & Plan Note (Addendum)
Stable period - will continue gabapentin at this time.  He saw neurosurgery with discussion of options for further evaluation, planning to defer while on blood thinners.  Discussed possible TCA vs tramadol in the future.

## 2018-04-26 NOTE — Assessment & Plan Note (Signed)
Appreciate cards care.

## 2018-04-26 NOTE — Assessment & Plan Note (Signed)
Plan to continue colchicine through 05/27/2018

## 2018-04-27 NOTE — Telephone Encounter (Signed)
OK to hold warfarin for 5 days prior to procedure without Lovenox bridging.

## 2018-04-27 NOTE — Telephone Encounter (Signed)
   Primary Cardiologist: Sinclair Grooms, MD  Chart reviewed as part of pre-operative protocol coverage. Patient was contacted 04/27/2018 in reference to pre-operative risk assessment for pending surgery as outlined below.  Jack [name] was last seen on 04/20/2018 by Melina Copa, PA-C.  Since that day, Jack [name] has done well from a cardiac standpoint. No complaints of chest pain or SOB. He is able to easily complete 4 METS without anginal complaints. Therefore, based on ACC/AHA guidelines, the patient would be at acceptable risk for the planned procedure without further cardiovascular testing.   Per pharmacy recommendations, patient may hold coumdan 5 days prior to his myelogram procedure and given recent GI bleed, will not bridge with lovenox. He should restart his coumadin when cleared to do so by Dr. Ellene Route.   I will route this recommendation to the requesting party via Epic fax function and remove from pre-op pool.  Please call with questions.  Abigail Butts, PA-C 04/27/2018, 8:56 AM

## 2018-04-28 ENCOUNTER — Ambulatory Visit (INDEPENDENT_AMBULATORY_CARE_PROVIDER_SITE_OTHER): Payer: BLUE CROSS/BLUE SHIELD | Admitting: Pharmacist

## 2018-04-28 ENCOUNTER — Other Ambulatory Visit: Payer: Self-pay | Admitting: Family Medicine

## 2018-04-28 ENCOUNTER — Other Ambulatory Visit: Payer: BLUE CROSS/BLUE SHIELD | Admitting: *Deleted

## 2018-04-28 DIAGNOSIS — Z5181 Encounter for therapeutic drug level monitoring: Secondary | ICD-10-CM | POA: Diagnosis not present

## 2018-04-28 DIAGNOSIS — I2510 Atherosclerotic heart disease of native coronary artery without angina pectoris: Secondary | ICD-10-CM

## 2018-04-28 DIAGNOSIS — Z7901 Long term (current) use of anticoagulants: Secondary | ICD-10-CM

## 2018-04-28 DIAGNOSIS — I236 Thrombosis of atrium, auricular appendage, and ventricle as current complications following acute myocardial infarction: Secondary | ICD-10-CM

## 2018-04-28 LAB — POCT INR: INR: 1.8 — AB (ref 2.0–3.0)

## 2018-04-28 LAB — BASIC METABOLIC PANEL
BUN/Creatinine Ratio: 18 (ref 9–20)
BUN: 26 mg/dL — ABNORMAL HIGH (ref 6–24)
CO2: 24 mmol/L (ref 20–29)
Calcium: 9.2 mg/dL (ref 8.7–10.2)
Chloride: 100 mmol/L (ref 96–106)
Creatinine, Ser: 1.45 mg/dL — ABNORMAL HIGH (ref 0.76–1.27)
GFR calc Af Amer: 64 mL/min/{1.73_m2} (ref >59)
GFR calc non Af Amer: 56 mL/min/{1.73_m2} — ABNORMAL LOW (ref >59)
Glucose: 95 mg/dL (ref 65–99)
Potassium: 4.5 mmol/L (ref 3.5–5.2)
Sodium: 138 mmol/L (ref 134–144)

## 2018-04-28 LAB — LIPID PANEL
Chol/HDL Ratio: 3.8 ratio (ref 0.0–5.0)
Cholesterol, Total: 144 mg/dL (ref 100–199)
HDL: 38 mg/dL — AB (ref >39)
LDL Calculated: 75 mg/dL (ref 0–99)
Triglycerides: 154 mg/dL — ABNORMAL HIGH (ref 0–149)
VLDL CHOLESTEROL CAL: 31 mg/dL (ref 5–40)

## 2018-04-28 NOTE — Patient Instructions (Signed)
Description   Take an extra 1/2 tablet today, then continue taking 1 tablet daily except 1/2 tablet on Tuesdays, Thursdays and Saturdays.  Recheck INR in 2 weeks. Coumadin Clinic [phone] Main [phone]

## 2018-04-30 ENCOUNTER — Other Ambulatory Visit: Payer: Self-pay | Admitting: Neurological Surgery

## 2018-04-30 ENCOUNTER — Other Ambulatory Visit (HOSPITAL_COMMUNITY): Payer: Self-pay | Admitting: Neurological Surgery

## 2018-04-30 DIAGNOSIS — G9619 Other disorders of meninges, not elsewhere classified: Principal | ICD-10-CM

## 2018-04-30 DIAGNOSIS — G96198 Other disorders of meninges, not elsewhere classified: Secondary | ICD-10-CM

## 2018-05-04 ENCOUNTER — Telehealth (HOSPITAL_COMMUNITY): Payer: Self-pay

## 2018-05-05 ENCOUNTER — Ambulatory Visit (HOSPITAL_COMMUNITY)
Admission: RE | Admit: 2018-05-05 | Discharge: 2018-05-05 | Disposition: A | Discharge status: Home / Self Care | Payer: BLUE CROSS/BLUE SHIELD | Source: Ambulatory Visit | Attending: Neurological Surgery | Admitting: Neurological Surgery

## 2018-05-05 ENCOUNTER — Other Ambulatory Visit: Payer: Self-pay

## 2018-05-05 DIAGNOSIS — G9619 Other disorders of meninges, not elsewhere classified: Principal | ICD-10-CM

## 2018-05-05 DIAGNOSIS — G96198 Other disorders of meninges, not elsewhere classified: Secondary | ICD-10-CM

## 2018-05-05 DIAGNOSIS — M488X6 Other specified spondylopathies, lumbar region: Secondary | ICD-10-CM | POA: Insufficient documentation

## 2018-05-05 DIAGNOSIS — M5416 Radiculopathy, lumbar region: Secondary | ICD-10-CM | POA: Insufficient documentation

## 2018-05-05 DIAGNOSIS — M542 Cervicalgia: Secondary | ICD-10-CM | POA: Diagnosis not present

## 2018-05-05 MED ORDER — DIAZEPAM 5 MG PO TABS
ORAL_TABLET | ORAL | Status: AC
  Administered 2018-05-05: 10 mg via ORAL
  Filled 2018-05-05: qty 2

## 2018-05-05 MED ORDER — ONDANSETRON HCL 4 MG/2ML IJ SOLN: 4.0000 mg | Freq: Four times a day (QID) | INTRAMUSCULAR | Status: DC | PRN

## 2018-05-05 MED ORDER — DIAZEPAM 5 MG PO TABS
10.0000 mg | ORAL_TABLET | Freq: Once | ORAL | Status: AC
  Administered 2018-05-05: 10 mg via ORAL

## 2018-05-05 MED ORDER — SODIUM CHLORIDE 0.9 % IV SOLN: 4.0000 mg | Freq: Four times a day (QID) | INTRAVENOUS | Status: AC | PRN

## 2018-05-05 MED ORDER — IOPAMIDOL (ISOVUE-M 200) INJECTION 41%
INTRAMUSCULAR | Status: AC
  Filled 2018-05-05: qty 10

## 2018-05-05 MED ORDER — LIDOCAINE HCL (PF) 1 % IJ SOLN
INTRAMUSCULAR | Status: AC
  Administered 2018-05-05: 2 mL via INTRADERMAL
  Filled 2018-05-05: qty 5

## 2018-05-05 MED ORDER — HYDROCODONE-ACETAMINOPHEN 5-325 MG PO TABS
1.0000 | ORAL_TABLET | ORAL | Status: DC | PRN
  Administered 2018-05-05: 2 via ORAL
  Filled 2018-05-05: qty 2

## 2018-05-05 MED ORDER — IOPAMIDOL (ISOVUE-M 200) INJECTION 41%
20.0000 mL | Freq: Once | INTRAMUSCULAR | Status: AC
  Administered 2018-05-05: 12 mL via INTRATHECAL

## 2018-05-05 MED ORDER — LIDOCAINE HCL (PF) 1 % IJ SOLN
5.0000 mL | Freq: Once | INTRAMUSCULAR | Status: AC
  Administered 2018-05-05: 2 mL via INTRADERMAL

## 2018-05-05 NOTE — Procedures (Signed)
Jack [name] is a 53 year old individual whose had significant back and left lower extremity pain for a number of years.  He more recently has developed right lower extremity pain.  He had apparently a schwannoma that was explored on the left side years ago a CT scan was performed a while back as the patient cannot have an MRI because he has a implantable spinal cord stimulator.  He notes that the pain is shifted now over to the right and is getting progressively worse.  After evaluation I advised that he should undergo myelography and a post myelogram CAT scan to better evaluate his spinal canal particularly in regards to the pseudomeningocele and any other pathology that may be present.  Pre op Dx: Lumbar radiculopathy, chronic Post op Dx: Same Procedure: Lumbar myelogram Surgeon: Beryle Zeitz Puncture level: L4-5 Fluid color: Clear colorless Injection: Isovue-200, 10 mL Findings: Mass within the canal behind the vertebral body of L3 near the L3-4 disc space.  Either represents herniated nucleus pulposus or intracanalicular tumor.  CT scan for further evaluation.

## 2018-05-05 NOTE — Discharge Instructions (Signed)

## 2018-05-06 DIAGNOSIS — D497 Neoplasm of unspecified behavior of endocrine glands and other parts of nervous system: Secondary | ICD-10-CM | POA: Diagnosis not present

## 2018-05-07 NOTE — Progress Notes (Signed)
Jack [name] 53 y.o. male DOB [birth year] MRN [id number]       Nutrition Screen Note  No diagnosis found. Past Medical History:  Diagnosis Date  . BPH (benign prostatic hypertrophy)   . CAD (coronary artery disease)    a. anterior STEMI 01/2018 -  proximal occlusion of LAD, treated with DES. Cath also showed 20% distal LM, 95% ostial-prox small-moderate ramus, 70-80% ostial Cx, 70% dominant ostial OM, 50-60% prox Cx, 70% RCA. EF 35% by cath with LVEDP 81mHg. Med rx for residual disease. Course complicated by post MI pericarditis and LV thrombus.  . Chronic systolic (congestive) heart failure ([place]   . Colitis   . Colon polyp    inflammatory  . Crohn disease ([facility] 1992   history uveitis, involvement of intestines and lungs  . Diverticulosis   . Eosinophilic granuloma ([place]   . Essential hypertension   . GERD (gastroesophageal reflux disease)   . GI bleeding   . History of chicken pox   . History of gastroesophageal reflux (GERD)   . History of seizure 1995   grand mal x1, completed 6 yrs dilantin. no seizures since  . Hyperlipidemia   . Internal hemorrhoids   . Ischemic cardiomyopathy   . LV (left ventricular) mural thrombus following MI ([facility] 01/2018  . Osteoporosis 11/2015   DEXA T -2.9  . Schwannoma 2007   L axilla s/p surgery  . Seizures ([facility]    last seizure 1995 and only x 1 seizure-   . Ulnar neuropathy    h/o this from L arm schwannoma   Meds reviewed.    Current Outpatient Medications (Endocrine & Metabolic):  .  alendronate (FOSAMAX) 70 MG tablet, Take 70 mg by mouth every Saturday.  .  predniSONE (DELTASONE) 1 MG tablet, Take 1 mg by mouth See admin instructions. Take 1 mg by mouth along with 160mand 5 mg tablet to equal 1672motal. .  predniSONE (DELTASONE) 10 MG tablet, Take 10 mg by mouth See admin instructions. Take 10 by mouth along with 1mg58md 5 mg tablet to equal 16mg53mal. .  predniSONE (DELTASONE) 5 MG tablet, Take 5 mg by mouth See admin  instructions. Take 5 mg by mouth along with 1mg a63m10mg t57mt to equal 16mg to21m   Current Outpatient Medications (Cardiovascular):  .  atorvastatin (LIPITOR) 80 MG tablet, Take 1 tablet (80 mg total) by mouth daily at 6 PM. .  carvedilol (COREG) 3.125 MG tablet, Take 1 tablet (3.125 mg total) by mouth 2 (two) times daily with a meal. .  furosemide (LASIX) 20 MG tablet, Take only as needed .  losartan (COZAAR) 25 MG tablet, Take 25 mg by mouth daily. .  nitroGLYCERIN (NITROSTAT) 0.4 MG SL tablet, Place 1 tablet (0.4 mg total) under the tongue every 5 (five) minutes as needed for chest pain. .  spiroMarland Kitchenolactone (ALDACTONE) 25 MG tablet, Take 0.5 tablets (12.5 mg total) by mouth daily.     Current Outpatient Medications (Analgesics):  .  acetaminophen (TYLENOL) 325 MG tablet, Take 650 mg by mouth every 6 (six) hours as needed for mild pain or headache.  .  colchicine 0.6 MG tablet, Take 1 tablet (0.6 mg total) by mouth daily.   Current Outpatient Medications (Hematological):  .  clopidogrel (PLAVIX) 75 MG tablet, Take 1 tablet (75 mg total) by mouth daily. .  warfaMarland Kitchenin (COUMADIN) 5 MG tablet, Take 5 mg tonight and tomorrow night and then back to home regimen of 5 mg daily  on Monday Wednesday Friday and Sunday and 2.5 mg daily on Tuesday Thursday Saturday. -INR check is on Friday 1/17   Current Outpatient Medications (Other):  [name]  Calcium Carbonate-Vit D-Min (CALCIUM 1200 PO), Take 1 capsule by mouth 2 (two) times daily. .  diphenoxylate-atropine (LOMOTIL) 2.5-0.025 MG tablet, Take 1-2 tablets by mouth 4 times daily as needed for diarrhea .  gabapentin (NEURONTIN) 300 MG capsule, Take one capsule TID .  hydrOXYzine (ATARAX/VISTARIL) 25 MG tablet, TAKE 4 TABLETS (100 MG TOTAL) BY MOUTH AT BEDTIME. .  Multiple Vitamin (MULTIVITAMIN) capsule, Take 1 capsule by mouth daily. .  pantoprazole (PROTONIX) 40 MG tablet, Take 1 tablet (40 mg total) by mouth 2 (two) times daily. .  tamsulosin  (FLOMAX) 0.4 MG CAPS capsule, TAKE 1 CAPSULE (0.4 MG TOTAL) BY MOUTH DAILY. .  vedolizumab (ENTYVIO) 300 MG injection, Inject 300 mg into the vein every 8 (eight) weeks.    Facility-Administered Medications Ordered in Other Encounters (Other):  .  ondansetron (ZOFRAN) 4 mg in sodium chloride 0.9 % 50 mL IVPB No current facility-administered medications for this encounter.    HT: Ht Readings from Last 1 Encounters:  05/05/18 5' 7"  (1.702 m)    WT: Wt Readings from Last 5 Encounters:  05/05/18 160 lb (72.6 kg)  04/26/18 171 lb 4 oz (77.7 kg)  04/20/18 169 lb 12.8 oz (77 kg)  04/07/18 165 lb (74.8 kg)  03/22/18 173 lb 8 oz (78.7 kg)     BMI = 25.06   Current tobacco use? No       Labs:  Lipid Panel     Component Value Date/Time   CHOL 144 04/28/2018 0910   TRIG 154 (H) 04/28/2018 0910   HDL 38 (L) 04/28/2018 0910   CHOLHDL 3.8 04/28/2018 0910   CHOLHDL 4.5 02/20/2018 2345   VLDL 8 02/20/2018 2345   LDLCALC 75 04/28/2018 0910    Lab Results  Component Value Date   HGBA1C 5.2 02/20/2018   CBG (last 3)  No results for input(s): GLUCAP in the last 72 hours.  Nutrition Diagnosis ? Food-and nutrition-related knowledge deficit related to lack of exposure to information as related to diagnosis of: ? CVD ? Overweight  related to excessive energy intake as evidenced by a BMI = 25.06  Nutrition Goal(s):  ? To be determined  Plan:  Pt to attend nutrition classes ? Nutrition I ? Nutrition II ? Portion Distortion  Will provide client-centered nutrition education as part of interdisciplinary care.   Monitor and evaluate progress toward nutrition goal with team.  Laurina Bustle, MS, RD, LDN 05/07/2018 12:29 PM

## 2018-05-10 ENCOUNTER — Telehealth (HOSPITAL_COMMUNITY): Payer: Self-pay | Admitting: Cardiac Rehabilitation

## 2018-05-10 NOTE — Telephone Encounter (Addendum)
Cardiac Rehab Medication Review by a Pharmacist  Does the patient feel that his/her medications are working for him/her?  yes  Has the patient been experiencing any side effects to the medications prescribed?  no  Does the patient measure his/her own blood pressure or blood glucose at home?  no   Does the patient have any problems obtaining medications due to transportation or finances?   no  Understanding of regimen: poor - wife manages medications and knows them well Understanding of indications: fair Potential of compliance: good    Pharmacist comments: Patient taking gabapentin differently than listed - he takes 300 mg PO qAM and 600 mg PO qPM    Jackson Latino, PharmD PGY1 Pharmacy Resident Phone [phone] 05/10/2018     3:21 PM

## 2018-05-10 NOTE — Telephone Encounter (Signed)
Phone attempt to contact pt for preassessment RN interview for cardiac rehab orientation. Specifically assess readiness with back pain under current  evaluation.   LMOM.   Andi Hence, RN, BSN Cardiac Pulmonary Rehab

## 2018-05-11 ENCOUNTER — Encounter (HOSPITAL_COMMUNITY): Payer: Self-pay

## 2018-05-11 ENCOUNTER — Encounter (HOSPITAL_COMMUNITY)
Admission: RE | Admit: 2018-05-11 | Discharge: 2018-05-11 | Disposition: A | Discharge status: Home / Self Care | Payer: BLUE CROSS/BLUE SHIELD | Source: Ambulatory Visit | Attending: Interventional Cardiology | Admitting: Interventional Cardiology

## 2018-05-11 VITALS — BP 102/60 | HR 70 | Ht 67.0 in | Wt 174.2 lb

## 2018-05-11 DIAGNOSIS — I213 ST elevation (STEMI) myocardial infarction of unspecified site: Secondary | ICD-10-CM | POA: Diagnosis not present

## 2018-05-11 DIAGNOSIS — Z955 Presence of coronary angioplasty implant and graft: Secondary | ICD-10-CM | POA: Diagnosis not present

## 2018-05-11 NOTE — Progress Notes (Signed)
Cardiac Individual Treatment Plan  Patient Details  Name: Jack [name] MRN: [id number] Date of Birth: 10-11-1965 Referring Provider:     CARDIAC REHAB PHASE II ORIENTATION from 05/11/2018 in [place]  Referring Provider  Dr. Tamala Julian      Initial Encounter Date:    CARDIAC REHAB PHASE II ORIENTATION from 05/11/2018 in [place]  Date  05/11/18      Visit Diagnosis: 02/20/18 STEMI  02/20/18 DES LAD  Patient's Home Medications on Admission:  Current Outpatient Medications:  .  acetaminophen (TYLENOL) 325 MG tablet, Take 650 mg by mouth every 6 (six) hours as needed for mild pain or headache. , Disp: , Rfl:  .  alendronate (FOSAMAX) 70 MG tablet, Take 70 mg by mouth every Saturday. , Disp: , Rfl: 11 .  atorvastatin (LIPITOR) 80 MG tablet, Take 1 tablet (80 mg total) by mouth daily at 6 PM., Disp: 30 tablet, Rfl: 3 .  Calcium Carbonate-Vit D-Min (CALCIUM 1200 PO), Take 1 capsule by mouth 2 (two) times daily., Disp: , Rfl:  .  carvedilol (COREG) 3.125 MG tablet, Take 1 tablet (3.125 mg total) by mouth 2 (two) times daily with a meal., Disp: 60 tablet, Rfl: 3 .  clopidogrel (PLAVIX) 75 MG tablet, Take 1 tablet (75 mg total) by mouth daily., Disp: 30 tablet, Rfl: 12 .  colchicine 0.6 MG tablet, Take 1 tablet (0.6 mg total) by mouth daily., Disp: 90 tablet, Rfl: 3 .  diphenoxylate-atropine (LOMOTIL) 2.5-0.025 MG tablet, Take 1-2 tablets by mouth 4 times daily as needed for diarrhea, Disp: 90 tablet, Rfl: 1 .  furosemide (LASIX) 20 MG tablet, Take only as needed, Disp: 90 tablet, Rfl: 3 .  gabapentin (NEURONTIN) 300 MG capsule, Take one capsule TID (Patient taking differently: 300-600 mg 2 (two) times daily. Takes 300 mg qAM and 600 mg qPM), Disp: 270 capsule, Rfl: 1 .  hydrOXYzine (ATARAX/VISTARIL) 25 MG tablet, TAKE 4 TABLETS (100 MG TOTAL) BY MOUTH AT BEDTIME., Disp: 360 tablet, Rfl: 2 .  losartan (COZAAR) 25 MG tablet,  Take 25 mg by mouth daily., Disp: , Rfl:  .  Multiple Vitamin (MULTIVITAMIN) capsule, Take 1 capsule by mouth daily., Disp: , Rfl:  .  nitroGLYCERIN (NITROSTAT) 0.4 MG SL tablet, Place 1 tablet (0.4 mg total) under the tongue every 5 (five) minutes as needed for chest pain., Disp: 25 tablet, Rfl: 12 .  pantoprazole (PROTONIX) 40 MG tablet, Take 1 tablet (40 mg total) by mouth 2 (two) times daily., Disp: 60 tablet, Rfl: 6 .  predniSONE (DELTASONE) 1 MG tablet, Take 1 mg by mouth See admin instructions. Take 1 mg by mouth along with 79m and 5 mg tablet to equal 135mtotal., Disp: , Rfl:  .  predniSONE (DELTASONE) 10 MG tablet, Take 10 mg by mouth See admin instructions. Take 10 by mouth along with 63m47mnd 5 mg tablet to equal 22m59mtal., Disp: , Rfl:  .  predniSONE (DELTASONE) 5 MG tablet, Take 5 mg by mouth See admin instructions. Take 5 mg by mouth along with 63mg 68m 10mg 84met to equal 22mg t48m., Disp: , Rfl: 2 .  spironolactone (ALDACTONE) 25 MG tablet, Take 0.5 tablets (12.5 mg total) by mouth daily., Disp: 90 tablet, Rfl: 3 .  tamsulosin (FLOMAX) 0.4 MG CAPS capsule, TAKE 1 CAPSULE (0.4 MG TOTAL) BY MOUTH DAILY., Disp: 90 capsule, Rfl: 3 .  vedolizumab (ENTYVIO) 300 MG injection, Inject 300 mg into the vein  every 8 (eight) weeks. , Disp: , Rfl:  .  warfarin (COUMADIN) 5 MG tablet, Take 5 mg tonight and tomorrow night and then back to home regimen of 5 mg daily on Monday Wednesday Friday and Sunday and 2.5 mg daily on Tuesday Thursday Saturday. -INR check is on Friday 1/17 (Patient taking differently: 5 mg daily on Monday Wednesday Friday and Sunday and 2.5 mg daily on Tuesday Thursday Saturday), Disp: 1 tablet, Rfl: 0 No current facility-administered medications for this encounter.   Facility-Administered Medications Ordered in Other Encounters:  .  ondansetron (ZOFRAN) 4 mg in sodium chloride 0.9 % 50 mL IVPB, 4 mg, Intravenous, Q6H PRN, Kristeen Miss, MD  Past Medical History: Past  Medical History:  Diagnosis Date  . BPH (benign prostatic hypertrophy)   . CAD (coronary artery disease)    a. anterior STEMI 01/2018 -  proximal occlusion of LAD, treated with DES. Cath also showed 20% distal LM, 95% ostial-prox small-moderate ramus, 70-80% ostial Cx, 70% dominant ostial OM, 50-60% prox Cx, 70% RCA. EF 35% by cath with LVEDP 73mHg. Med rx for residual disease. Course complicated by post MI pericarditis and LV thrombus.  . Chronic systolic (congestive) heart failure ([facility]   . Colitis   . Colon polyp    inflammatory  . Crohn disease ([facility] 1992   history uveitis, involvement of intestines and lungs  . Diverticulosis   . Eosinophilic granuloma ([facility]   . Essential hypertension   . GERD (gastroesophageal reflux disease)   . GI bleeding   . History of chicken pox   . History of gastroesophageal reflux (GERD)   . History of seizure 1995   grand mal x1, completed 6 yrs dilantin. no seizures since  . Hyperlipidemia   . Internal hemorrhoids   . Ischemic cardiomyopathy   . LV (left ventricular) mural thrombus following MI ([place] 01/2018  . Osteoporosis 11/2015   DEXA T -2.9  . Schwannoma 2007   L axilla s/p surgery  . Seizures ([facility]    last seizure 1995 and only x 1 seizure-   . Ulnar neuropathy    h/o this from L arm schwannoma    Tobacco Use: Social History   Tobacco Use  Smoking Status Former Smoker  . Types: Cigars  . Last attempt to quit: 02/20/2018  . Years since quitting: 0.2  Smokeless Tobacco Never Used    Labs: Recent Review [employer], physiological   Labs for ITP Cardiac and Pulmonary Rehab Latest Ref Rng & Units 02/20/2018 02/20/2018 02/20/2018 04/20/2018 04/28/2018   Cholestrol 100 - 199 mg/dL - - 224(H) 165 144   LDLCALC 0 - 99 mg/dL - - 166(H) 92 75   HDL >39 mg/dL - - 50 38(L) 38(L)   Trlycerides 0 - 149 mg/dL - - 40 175(H) 154(H)   Hemoglobin A1c 4.8 - 5.6 % - 5.2 - - -   TCO2 22 - 32 mmol/L 24 - - - -      Capillary Blood Glucose: No results  found for: GLUCAP   Exercise Target Goals: Exercise Program Goal: Individual exercise prescription set using results from initial 6 min walk test and THRR while considering  patient's activity barriers and safety.   Exercise Prescription Goal: Initial exercise prescription builds to 30-45 minutes a day of aerobic activity, 2-3 days per week.  Home exercise guidelines will be given to patient during program as part of exercise prescription that the participant will acknowledge.  Activity Barriers & Risk Stratification: Activity Barriers &  Cardiac Risk Stratification - 05/11/18 0905      Activity Barriers & Cardiac Risk Stratification   Activity Barriers  None    Cardiac Risk Stratification  High       6 Minute Walk: 6 Minute Walk    Row Name 05/11/18 1017         6 Minute Walk   Phase  Initial     Distance  1800 feet     Walk Time  6 minutes     # of Rest Breaks  0     MPH  3.4     METS  4.7     RPE  12     VO2 Peak  16.61     Symptoms  No     Resting HR  70 bpm     Resting BP  102/60     Resting Oxygen Saturation   100 %     Exercise Oxygen Saturation  during 6 min walk  100 %     Max Ex. HR  93 bpm     Max Ex. BP  122/68     2 Minute Post BP  114/70        Oxygen Initial Assessment:   Oxygen Re-Evaluation:   Oxygen Discharge (Final Oxygen Re-Evaluation):   Initial Exercise Prescription: Initial Exercise Prescription - 05/11/18 1000      Date of Initial Exercise RX and Referring Provider   Date  05/11/18    Referring Provider  Dr. Tamala Julian    Expected Discharge Date  08/16/18      Treadmill   MPH  3.8    Grade  2    Minutes  10      Bike   Level  1.5    Minutes  10    METs  4.54      Elliptical   Level  2    Speed  2    Minutes  10    METs  4.5      Prescription Details   Frequency (times per week)  3    Duration  Progress to 30 minutes of continuous aerobic without signs/symptoms of physical distress      Intensity   THRR 40-80% of Max  Heartrate  68-136    Ratings of Perceived Exertion  11-13      Progression   Progression  Continue to progress workloads to maintain intensity without signs/symptoms of physical distress.      Resistance Training   Training Prescription  Yes    Weight  4 lbs.     Reps  10-15       Perform Capillary Blood Glucose checks as needed.  Exercise Prescription Changes:   Exercise Comments:   Exercise Goals and Review:  Exercise Goals    Row Name 05/11/18 0905             Exercise Goals   Increase Physical Activity  Yes       Intervention  Provide advice, education, support and counseling about physical activity/exercise needs.;Develop an individualized exercise prescription for aerobic and resistive training based on initial evaluation findings, risk stratification, comorbidities and participant's personal goals.       Expected Outcomes  Short Term: Attend rehab on a regular basis to increase amount of physical activity.       Increase Strength and Stamina  Yes       Intervention  Provide advice, education, support and counseling about physical activity/exercise needs.;Develop an individualized  exercise prescription for aerobic and resistive training based on initial evaluation findings, risk stratification, comorbidities and participant's personal goals.       Expected Outcomes  Short Term: Increase workloads from initial exercise prescription for resistance, speed, and METs.       Able to understand and use rate of perceived exertion (RPE) scale  Yes       Intervention  Provide education and explanation on how to use RPE scale       Expected Outcomes  Short Term: Able to use RPE daily in rehab to express subjective intensity level;Long Term:  Able to use RPE to guide intensity level when exercising independently       Knowledge and understanding of Target Heart Rate Range (THRR)  Yes       Intervention  Provide education and explanation of THRR including how the numbers were  predicted and where they are located for reference       Expected Outcomes  Short Term: Able to state/look up THRR;Long Term: Able to use THRR to govern intensity when exercising independently;Short Term: Able to use daily as guideline for intensity in rehab       Able to check pulse independently  Yes       Intervention  Provide education and demonstration on how to check pulse in carotid and radial arteries.;Review the importance of being able to check your own pulse for safety during independent exercise       Expected Outcomes  Short Term: Able to explain why pulse checking is important during independent exercise;Long Term: Able to check pulse independently and accurately       Understanding of Exercise Prescription  Yes       Intervention  Provide education, explanation, and written materials on patient's individual exercise prescription       Expected Outcomes  Short Term: Able to explain program exercise prescription;Long Term: Able to explain home exercise prescription to exercise independently          Exercise Goals Re-Evaluation :   Discharge Exercise Prescription (Final Exercise Prescription Changes):   Nutrition:  Target Goals: Understanding of nutrition guidelines, daily intake of sodium <1573m, cholesterol <2014m calories 30% from fat and 7% or less from saturated fats, daily to have 5 or more servings of fruits and vegetables.  Biometrics: Pre Biometrics - 05/11/18 1018      Pre Biometrics   Height  5' 7"  (1.702 m)    Weight  79 kg    Waist Circumference  37.5 inches    Hip Circumference  42 inches    Waist to Hip Ratio  0.89 %    BMI (Calculated)  27.27    Triceps Skinfold  25 mm    % Body Fat  27.8 %    Grip Strength  43 kg    Flexibility  0 in    Single Leg Stand  30 seconds        Nutrition Therapy Plan and Nutrition Goals:   Nutrition Assessments:   Nutrition Goals Re-Evaluation:   Nutrition Goals Re-Evaluation:   Nutrition Goals Discharge  (Final Nutrition Goals Re-Evaluation):   Psychosocial: Target Goals: Acknowledge presence or absence of significant depression and/or stress, maximize coping skills, provide positive support system. Participant is able to verbalize types and ability to use techniques and skills needed for reducing stress and depression.  Initial Review & Psychosocial Screening: Initial Psych Review & Screening - 05/11/18 1133      Initial Review   Current  issues with  Current Stress Concerns    Source of Stress Concerns  Chronic Illness;Occupation      Family Dynamics   [place]?  Yes   [name] has his wife for support     Barriers   Psychosocial barriers to participate in program  The patient should benefit from training in stress management and relaxation.;Psychosocial barriers identified (see note)      Screening Interventions   Interventions  Encouraged to exercise    Expected Outcomes  Long Term Goal: Stressors or current issues are controlled or eliminated.       Quality of Life Scores: Quality of Life - 05/11/18 1019      Quality of Life   Select  Quality of Life      Quality of Life Scores   Health/Function Pre  22.47 %    Socioeconomic Pre  27.69 %    Psych/Spiritual Pre  26.36 %    Family Pre  26.4 %    GLOBAL Pre  25 %      Scores of 19 and below usually indicate a poorer quality of life in these areas.  A difference of  2-3 points is a clinically meaningful difference.  A difference of 2-3 points in the total score of the Quality of Life Index has been associated with significant improvement in overall quality of life, self-image, physical symptoms, and general health in studies assessing change in quality of life.  PHQ-9: Recent Review Flowsheet Data    Depression screen [facility] 2/9 03/20/2017   Decreased Interest 0   Down, Depressed, Hopeless 0   PHQ - 2 Score 0     Interpretation of Total Score  Total Score Depression Severity:  1-4 = Minimal depression, 5-9 =  Mild depression, 10-14 = Moderate depression, 15-19 = Moderately severe depression, 20-27 = Severe depression   Psychosocial Evaluation and Intervention:   Psychosocial Re-Evaluation:   Psychosocial Discharge (Final Psychosocial Re-Evaluation):   Vocational Rehabilitation: Provide vocational rehab assistance to qualifying candidates.   Vocational Rehab Evaluation & Intervention: Vocational Rehab - 05/11/18 1146      Initial Vocational Rehab Evaluation & Intervention   Assessment shows need for Vocational Rehabilitation  No   [name] does not need vocational rehab at this time as he is self employed      Education: Education Goals: Education classes will be provided on a weekly basis, covering required topics. Participant will state understanding/return demonstration of topics presented.  Learning Barriers/Preferences: Learning Barriers/Preferences - 05/11/18 3086      Learning Barriers/Preferences   Learning Barriers  Sight    Learning Preferences  Written Material;Video;Pictoral;Skilled Demonstration       Education Topics: Count Your Pulse:  -Group instruction provided by verbal instruction, demonstration, patient participation and written materials to support subject.  Instructors address importance of being able to find your pulse and how to count your pulse when at home without a heart monitor.  Patients get hands on experience counting their pulse with staff help and individually.   Heart Attack, Angina, and Risk Factor Modification:  -Group instruction provided by verbal instruction, video, and written materials to support subject.  Instructors address signs and symptoms of angina and heart attacks.    Also discuss risk factors for heart disease and how to make changes to improve heart health risk factors.   Functional Fitness:  -Group instruction provided by verbal instruction, demonstration, patient participation, and written materials to support subject.   Instructors address safety measures for  doing things around the house.  Discuss how to get up and down off the floor, how to pick things up properly, how to safely get out of a chair without assistance, and balance training.   Meditation and Mindfulness:  -Group instruction provided by verbal instruction, patient participation, and written materials to support subject.  Instructor addresses importance of mindfulness and meditation practice to help reduce stress and improve awareness.  Instructor also leads participants through a meditation exercise.    Stretching for Flexibility and Mobility:  -Group instruction provided by verbal instruction, patient participation, and written materials to support subject.  Instructors lead participants through series of stretches that are designed to increase flexibility thus improving mobility.  These stretches are additional exercise for major muscle groups that are typically performed during regular warm up and cool down.   Hands Only CPR:  -Group verbal, video, and participation provides a basic overview of AHA guidelines for community CPR. Role-play of emergencies allow participants the opportunity to practice calling for help and chest compression technique with discussion of AED use.   Hypertension: -Group verbal and written instruction that provides a basic overview of hypertension including the most recent diagnostic guidelines, risk factor reduction with self-care instructions and medication management.    Nutrition I class: Heart Healthy Eating:  -Group instruction provided by PowerPoint slides, verbal discussion, and written materials to support subject matter. The instructor gives an explanation and review of the Therapeutic Lifestyle Changes diet recommendations, which includes a discussion on lipid goals, dietary fat, sodium, fiber, plant stanol/sterol esters, sugar, and the components of a well-balanced, healthy diet.   Nutrition II class:  Lifestyle Skills:  -Group instruction provided by PowerPoint slides, verbal discussion, and written materials to support subject matter. The instructor gives an explanation and review of label reading, grocery shopping for heart health, heart healthy recipe modifications, and ways to make healthier choices when eating out.   Diabetes Question & Answer:  -Group instruction provided by PowerPoint slides, verbal discussion, and written materials to support subject matter. The instructor gives an explanation and review of diabetes co-morbidities, pre- and post-prandial blood glucose goals, pre-exercise blood glucose goals, signs, symptoms, and treatment of hypoglycemia and hyperglycemia, and foot care basics.   Diabetes Blitz:  -Group instruction provided by PowerPoint slides, verbal discussion, and written materials to support subject matter. The instructor gives an explanation and review of the physiology behind type 1 and type 2 diabetes, diabetes medications and rational behind using different medications, pre- and post-prandial blood glucose recommendations and Hemoglobin A1c goals, diabetes diet, and exercise including blood glucose guidelines for exercising safely.    Portion Distortion:  -Group instruction provided by PowerPoint slides, verbal discussion, written materials, and food models to support subject matter. The instructor gives an explanation of serving size versus portion size, changes in portions sizes over the last 20 years, and what consists of a serving from each food group.   Stress Management:  -Group instruction provided by verbal instruction, video, and written materials to support subject matter.  Instructors review role of stress in heart disease and how to cope with stress positively.     Exercising on Your Own:  -Group instruction provided by verbal instruction, power point, and written materials to support subject.  Instructors discuss benefits of exercise, components  of exercise, frequency and intensity of exercise, and end points for exercise.  Also discuss use of nitroglycerin and activating EMS.  Review options of places to exercise outside of rehab.  Review guidelines  for sex with heart disease.   Cardiac Drugs I:  -Group instruction provided by verbal instruction and written materials to support subject.  Instructor reviews cardiac drug classes: antiplatelets, anticoagulants, beta blockers, and statins.  Instructor discusses reasons, side effects, and lifestyle considerations for each drug class.   Cardiac Drugs II:  -Group instruction provided by verbal instruction and written materials to support subject.  Instructor reviews cardiac drug classes: angiotensin converting enzyme inhibitors (ACE-I), angiotensin II receptor blockers (ARBs), nitrates, and calcium channel blockers.  Instructor discusses reasons, side effects, and lifestyle considerations for each drug class.   Anatomy and Physiology of the Circulatory System:  Group verbal and written instruction and models provide basic cardiac anatomy and physiology, with the coronary electrical and arterial systems. Review of: AMI, Angina, Valve disease, Heart Failure, Peripheral Artery Disease, Cardiac Arrhythmia, Pacemakers, and the ICD.   Other Education:  -Group or individual verbal, written, or video instructions that support the educational goals of the cardiac rehab program.   Holiday Eating Survival Tips:  -Group instruction provided by PowerPoint slides, verbal discussion, and written materials to support subject matter. The instructor gives patients tips, tricks, and techniques to help them not only survive but enjoy the holidays despite the onslaught of food that accompanies the holidays.   Knowledge Questionnaire Score: Knowledge Questionnaire Score - 05/11/18 0902      Knowledge Questionnaire Score   Pre Score  23/24       Core Components/Risk Factors/Patient Goals at  Admission: Personal Goals and Risk Factors at Admission - 05/11/18 1205      Core Components/Risk Factors/Patient Goals on Admission    Weight Management  Yes;Weight Maintenance    Intervention  Weight Management: Develop a combined nutrition and exercise program designed to reach desired caloric intake, while maintaining appropriate intake of nutrient and fiber, sodium and fats, and appropriate energy expenditure required for the weight goal.;Weight Management: Provide education and appropriate resources to help participant work on and attain dietary goals.    Admit Weight  174 lb 2.6 oz (79 kg)    Expected Outcomes  Short Term: Continue to assess and modify interventions until short term weight is achieved;Long Term: Adherence to nutrition and physical activity/exercise program aimed toward attainment of established weight goal;Weight Maintenance: Understanding of the daily nutrition guidelines, which includes 25-35% calories from fat, 7% or less cal from saturated fats, less than 227m cholesterol, less than 1.5gm of sodium, & 5 or more servings of fruits and vegetables daily;Understanding recommendations for meals to include 15-35% energy as protein, 25-35% energy from fat, 35-60% energy from carbohydrates, less than 2071mof dietary cholesterol, 20-35 gm of total fiber daily;Understanding of distribution of calorie intake throughout the day with the consumption of 4-5 meals/snacks    Heart Failure  Yes    Intervention  Provide a combined exercise and nutrition program that is supplemented with education, support and counseling about heart failure. Directed toward relieving symptoms such as shortness of breath, decreased exercise tolerance, and extremity edema.    Expected Outcomes  Improve functional capacity of life;Short term: Attendance in program 2-3 days a week with increased exercise capacity. Reported lower sodium intake. Reported increased fruit and vegetable intake. Reports medication  compliance.;Short term: Daily weights obtained and reported for increase. Utilizing diuretic protocols set by physician.;Long term: Adoption of self-care skills and reduction of barriers for early signs and symptoms recognition and intervention leading to self-care maintenance.    Hypertension  Yes    Intervention  Provide education  on lifestyle modifcations including regular physical activity/exercise, weight management, moderate sodium restriction and increased consumption of fresh fruit, vegetables, and low fat dairy, alcohol moderation, and smoking cessation.;Monitor prescription use compliance.    Expected Outcomes  Short Term: Continued assessment and intervention until BP is < 140/6m HG in hypertensive participants. < 130/848mHG in hypertensive participants with diabetes, heart failure or chronic kidney disease.;Long Term: Maintenance of blood pressure at goal levels.    Lipids  Yes    Intervention  Provide education and support for participant on nutrition & aerobic/resistive exercise along with prescribed medications to achieve LDL <7058mHDL >28m64m  Expected Outcomes  Short Term: Participant states understanding of desired cholesterol values and is compliant with medications prescribed. Participant is following exercise prescription and nutrition guidelines.;Long Term: Cholesterol controlled with medications as prescribed, with individualized exercise RX and with personalized nutrition plan. Value goals: LDL < 70mg27mL > 40 mg.    Stress  Yes    Intervention  Offer individual and/or small group education and counseling on adjustment to heart disease, stress management and health-related lifestyle change. Teach and support self-help strategies.;Refer participants experiencing significant psychosocial distress to appropriate mental health specialists for further evaluation and treatment. When possible, include family members and significant others in education/counseling sessions.    Expected  Outcomes  Short Term: Participant demonstrates changes in health-related behavior, relaxation and other stress management skills, ability to obtain effective social support, and compliance with psychotropic medications if prescribed.;Long Term: Emotional wellbeing is indicated by absence of clinically significant psychosocial distress or social isolation.       Core Components/Risk Factors/Patient Goals Review:    Core Components/Risk Factors/Patient Goals at Discharge (Final Review):    ITP Comments: ITP Comments    Row Name 05/11/18 0829           ITP Comments  Medical Director- Dr. TraciFransico Him         Comments: Jeff Merry Proudnded orientation from 0829 [phone] to review rules and guidelines for program. Completed 6 minute walk test, Intitial ITP, and exercise prescription.  VSS. Telemetry-Sinus Rhythm rare PVC.  Asymptomatic.MariaBarnet PallBSN 05/11/2018 12:10 PM

## 2018-05-12 ENCOUNTER — Encounter: Payer: Self-pay | Admitting: Family Medicine

## 2018-05-12 ENCOUNTER — Ambulatory Visit (INDEPENDENT_AMBULATORY_CARE_PROVIDER_SITE_OTHER): Payer: BLUE CROSS/BLUE SHIELD | Admitting: *Deleted

## 2018-05-12 DIAGNOSIS — Z7901 Long term (current) use of anticoagulants: Secondary | ICD-10-CM | POA: Diagnosis not present

## 2018-05-12 DIAGNOSIS — I236 Thrombosis of atrium, auricular appendage, and ventricle as current complications following acute myocardial infarction: Secondary | ICD-10-CM

## 2018-05-12 DIAGNOSIS — D497 Neoplasm of unspecified behavior of endocrine glands and other parts of nervous system: Secondary | ICD-10-CM | POA: Insufficient documentation

## 2018-05-12 DIAGNOSIS — Z5181 Encounter for therapeutic drug level monitoring: Secondary | ICD-10-CM | POA: Diagnosis not present

## 2018-05-12 LAB — POCT INR: INR: 1.4 — AB (ref 2.0–3.0)

## 2018-05-12 NOTE — Progress Notes (Signed)
Jack [name] 53 y.o. male DOB: February 23, 1966 MRN: [id number]      Nutrition Note  1. 02/20/18 STEMI   2. 02/20/18 DES LAD    Past Medical History:  Diagnosis Date  . BPH (benign prostatic hypertrophy)   . CAD (coronary artery disease)    a. anterior STEMI 01/2018 -  proximal occlusion of LAD, treated with DES. Cath also showed 20% distal LM, 95% ostial-prox small-moderate ramus, 70-80% ostial Cx, 70% dominant ostial OM, 50-60% prox Cx, 70% RCA. EF 35% by cath with LVEDP 1mHg. Med rx for residual disease. Course complicated by post MI pericarditis and LV thrombus.  . Chronic systolic [place]congestive) heart failure ([place]   . Colitis   . Colon polyp    inflammatory  . Crohn disease ([place] 1992   history uveitis, involvement of intestines and lungs  . Diverticulosis   . [place]osinophilic granuloma ([place]   . Essential hypertension   . GERD (gastroesophageal reflux disease)   . GI bleeding   . History of chicken pox   . History of gastroesophageal reflux (GERD)   . History of seizure 1995   grand mal x1, completed 6 yrs dilantin. no seizures since  . Hyperlipidemia   . Internal hemorrhoids   . Ischemic cardiomyopathy   . LV (left ventricular) mural thrombus following MI ([place] 01/2018  . Osteoporosis 11/2015   DEXA T -2.9  . Schwannoma 2007   L axilla s/p surgery  . Seizures ([place]    last seizure 1995 and only x 1 seizure-   . Ulnar neuropathy    h/o this from L arm schwannoma   Meds reviewed.   Current Outpatient Medications (Endocrine & Metabolic):  .  alendronate (FOSAMAX) 70 MG tablet, Take 70 mg by mouth every Saturday.  .  predniSONE (DELTASONE) 1 MG tablet, Take 1 mg by mouth See admin instructions. Take 1 mg by mouth along with 145mand 5 mg tablet to equal 1621motal. .  predniSONE (DELTASONE) 10 MG tablet, Take 10 mg by mouth See admin instructions. Take 10 by mouth along with 1mg65md 5 mg tablet to equal 16mg22mal. .  predniSONE (DELTASONE) 5 MG tablet, Take 5 mg by mouth  See admin instructions. Take 5 mg by mouth along with 1mg a62m10mg t1mt to equal 16mg to15m   Current Outpatient Medications (Cardiovascular):  .  atorvastatin (LIPITOR) 80 MG tablet, Take 1 tablet (80 mg total) by mouth daily at 6 PM. .  carvedilol (COREG) 3.125 MG tablet, Take 1 tablet (3.125 mg total) by mouth 2 (two) times daily with a meal. .  furosemide (LASIX) 20 MG tablet, Take only as needed .  losartan (COZAAR) 25 MG tablet, Take 25 mg by mouth daily. .  nitroGLYCERIN (NITROSTAT) 0.4 MG SL tablet, Place 1 tablet (0.4 mg total) under the tongue every 5 (five) minutes as needed for chest pain. .  spiroMarland Kitchenolactone (ALDACTONE) 25 MG tablet, Take 0.5 tablets (12.5 mg total) by mouth daily.     Current Outpatient Medications (Analgesics):  .  acetaminophen (TYLENOL) 325 MG tablet, Take 650 mg by mouth every 6 (six) hours as needed for mild pain or headache.  .  colchicine 0.6 MG tablet, Take 1 tablet (0.6 mg total) by mouth daily.   Current Outpatient Medications (Hematological):  .  clopidogrel (PLAVIX) 75 MG tablet, Take 1 tablet (75 mg total) by mouth daily. .  warfaMarland Kitchenin (COUMADIN) 5 MG tablet, Take 5 mg tonight and tomorrow night and then back to  home regimen of 5 mg daily on Monday Wednesday Friday and Sunday and 2.5 mg daily on Tuesday Thursday Saturday. -INR check is on Friday 1/17 (Patient taking differently: 5 mg daily on Monday Wednesday Friday and Sunday and 2.5 mg daily on Tuesday Thursday Saturday)   Current Outpatient Medications (Other):  [name]  Calcium Carbonate-Vit D-Min (CALCIUM 1200 PO), Take 1 capsule by mouth 2 (two) times daily. .  diphenoxylate-atropine (LOMOTIL) 2.5-0.025 MG tablet, Take 1-2 tablets by mouth 4 times daily as needed for diarrhea .  gabapentin (NEURONTIN) 300 MG capsule, Take one capsule TID (Patient taking differently: 300-600 mg 2 (two) times daily. Takes 300 mg qAM and 600 mg qPM) .  hydrOXYzine (ATARAX/VISTARIL) 25 MG tablet, TAKE 4 TABLETS (100  MG TOTAL) BY MOUTH AT BEDTIME. .  Multiple Vitamin (MULTIVITAMIN) capsule, Take 1 capsule by mouth daily. .  pantoprazole (PROTONIX) 40 MG tablet, Take 1 tablet (40 mg total) by mouth 2 (two) times daily. .  tamsulosin (FLOMAX) 0.4 MG CAPS capsule, TAKE 1 CAPSULE (0.4 MG TOTAL) BY MOUTH DAILY. .  vedolizumab (ENTYVIO) 300 MG injection, Inject 300 mg into the vein every 8 (eight) weeks.    Facility-Administered Medications Ordered in Other Encounters (Other):  .  ondansetron (ZOFRAN) 4 mg in sodium chloride 0.9 % 50 mL IVPB No current facility-administered medications for this encounter.    HT: Ht Readings from Last 1 Encounters:  05/11/18 5' 7"  (1.702 m)    WT: Wt Readings from Last 5 Encounters:  05/11/18 174 lb 2.6 oz (79 kg)  05/05/18 160 lb (72.6 kg)  04/26/18 171 lb 4 oz (77.7 kg)  04/20/18 169 lb 12.8 oz (77 kg)  04/07/18 165 lb (74.8 kg)     Body mass index is 27.28 kg/m.   Current tobacco use? No  Labs:  Lipid Panel     Component Value Date/Time   CHOL 144 04/28/2018 0910   TRIG 154 (H) 04/28/2018 0910   HDL 38 (L) 04/28/2018 0910   CHOLHDL 3.8 04/28/2018 0910   CHOLHDL 4.5 02/20/2018 2345   VLDL 8 02/20/2018 2345   LDLCALC 75 04/28/2018 0910    Lab Results  Component Value Date   HGBA1C 5.2 02/20/2018   CBG (last 3)  No results for input(s): GLUCAP in the last 72 hours.  Nutrition Note Spoke with pt. Nutrition plan and goals reviewed with pt. Pt is following Step 1 of the Therapeutic Lifestyle Changes diet. Pt wants to lose wt. Pt has been trying to lose wt by eating low carb and using 3 meal replacements a day. Wt loss tips reviewed (label reading, how to build a healthy plate, portion sizes, weighing and measuring foods, eating frequently across the day, food journaling). Discussed with patient eating more regularly across the day and using more food based eating episodes since he tends to overeat and identify with binge eating in the evening. Recommended  pt weigh and measure portion sizes and develop a regular set schedule for eating with more food based episodes instead of convenient meal replacements. Per discussion, pt does not use canned/convenience foods often. Pt does not add salt to food. Pt does not eat out frequently. Pt expressed understanding of the information reviewed. Pt aware of nutrition education classes offered and would like to attend nutrition classes.  Nutrition Diagnosis ? Food-and nutrition-related knowledge deficit related to lack of exposure to information as related to diagnosis of: ? CVD  ? Overweight  related to excessive energy intake as evidenced by  a Body mass index is 27.28 kg/m.  Nutrition Intervention ? Pt's individual nutrition plan and goals reviewed with pt. ? Pt given handouts for: ? Nutrition I class ? Nutrition II class  ? Diabetes Blitz Class ? Diabetes Q & A class  ? Consistent vit K diet ? low sodium ? DM ? pre-diabetes  Nutrition Goal(s):  ? Pt to identify food quantities necessary to achieve weight loss of 6-24 lb at graduation from cardiac rehab.  ? Pt to build a healthy plate including vegetables, fruits, whole grains, and low-fat dairy products in a heart healthy meal plan. ? Pt to weigh and measure serving sizes ? Pt to eat a variety of non-starchy vegetables.  Plan:  ? Pt to attend nutrition classes:  ? Nutrition I ? Nutrition II ? Portion Distortion  ? Will provide client-centered nutrition education as part of interdisciplinary care ? Monitor and evaluate progress toward nutrition goal with team.   Laurina Bustle, MS, RD, LDN 05/12/2018 9:23 AM

## 2018-05-12 NOTE — Patient Instructions (Addendum)
Description   Take an extra 1/2 tablet today and 1 tablet tomorrow, then continue taking 1 tablet daily except 1/2 tablet on Tuesdays, Thursdays and Saturdays.  Recheck INR in 1 week. Coumadin Clinic [phone] Main [phone]

## 2018-05-19 ENCOUNTER — Encounter (HOSPITAL_COMMUNITY): Payer: Self-pay

## 2018-05-19 ENCOUNTER — Ambulatory Visit (HOSPITAL_COMMUNITY): Payer: BLUE CROSS/BLUE SHIELD

## 2018-05-19 ENCOUNTER — Encounter (HOSPITAL_COMMUNITY)
Admission: RE | Admit: 2018-05-19 | Discharge: 2018-05-19 | Disposition: A | Discharge status: Home / Self Care | Payer: BLUE CROSS/BLUE SHIELD | Source: Ambulatory Visit | Attending: Interventional Cardiology | Admitting: Interventional Cardiology

## 2018-05-19 DIAGNOSIS — Z955 Presence of coronary angioplasty implant and graft: Secondary | ICD-10-CM | POA: Diagnosis not present

## 2018-05-19 DIAGNOSIS — I213 ST elevation (STEMI) myocardial infarction of unspecified site: Secondary | ICD-10-CM | POA: Diagnosis not present

## 2018-05-19 NOTE — Progress Notes (Signed)
Daily Session Note  Patient Details  Name: Jack [name] MRN: [id number] Date of Birth: 08-30-1965 Referring Provider:   Flowsheet Row CARDIAC REHAB PHASE II ORIENTATION from 05/11/2018 in [place]  Referring Provider  Dr. Tamala Julian      Encounter Date: 05/19/2018  Check In: Session Check In - 05/19/18 0855    Check-In          Supervising physician immediately available to respond to emergencies  [facility] immediately available    Physician(s)  Dr. Lonny Prude    Location  [facility]    Staff Present  Andi Hence, RN, Mosie Epstein, MS,ACSM CEP, Exercise Physiologist;Dalton Kris Mouton, MS, Exercise Physiologist;Brittany Durene Fruits, BS, ACSM CEP, Exercise Physiologist    Medication changes reported      No    Fall or balance concerns reported     No    Tobacco Cessation  No Change    Warm-up and Cool-down  Performed as group-led instruction    Resistance Training Performed  No    VAD Patient?  No    PAD/SET Patient?  No        Pain Assessment          Currently in Pain?  No/denies    Pain Score  0-No pain    Multiple Pain Sites  No           Capillary Blood Glucose: No results found for this or any previous visit (from the past 24 hour(s)).    Social History   Tobacco Use  Smoking Status Former Smoker  . Types: Cigars  . Last attempt to quit: 02/20/2018  . Years since quitting: 0.2  Smokeless Tobacco Never Used    Goals Met:  Exercise tolerated well  Goals Unmet:  Not Applicable  Comments: Pt started cardiac rehab today.  Pt tolerated light exercise without difficulty. VSS, telemetry-sinus rhythm, asymptomatic.  Medication list reconciled. Pt denies barriers to medicaiton compliance.  PSYCHOSOCIAL ASSESSMENT:  PHQ-0. Pt exhibits positive coping skills, hopeful outlook with supportive family. Pt with work and chronic illness stress, otherwise  No psychosocial needs identified at this time, no psychosocial  interventions necessary.  Pt oriented to exercise equipment and routine.    Understanding verbalized. Andi Hence, RN, BSN Cardiac Pulmonary Rehab   Dr. Fransico Him is Medical Director for Cardiac Rehab at [facility].

## 2018-05-20 ENCOUNTER — Telehealth: Payer: Self-pay | Admitting: *Deleted

## 2018-05-20 ENCOUNTER — Ambulatory Visit (INDEPENDENT_AMBULATORY_CARE_PROVIDER_SITE_OTHER): Payer: BLUE CROSS/BLUE SHIELD | Admitting: Pharmacist

## 2018-05-20 ENCOUNTER — Other Ambulatory Visit: Payer: Self-pay | Admitting: Neurological Surgery

## 2018-05-20 DIAGNOSIS — Z5181 Encounter for therapeutic drug level monitoring: Secondary | ICD-10-CM

## 2018-05-20 DIAGNOSIS — I236 Thrombosis of atrium, auricular appendage, and ventricle as current complications following acute myocardial infarction: Secondary | ICD-10-CM | POA: Diagnosis not present

## 2018-05-20 DIAGNOSIS — Z7901 Long term (current) use of anticoagulants: Secondary | ICD-10-CM

## 2018-05-20 LAB — POCT INR: INR: 1.7 — AB (ref 2.0–3.0)

## 2018-05-20 NOTE — Patient Instructions (Signed)
Start taking 1 tablet daily except 1/2 tablet on Tuesdays and Saturdays.  Recheck INR in 1 week. Coumadin Clinic [phone] Main [phone]

## 2018-05-20 NOTE — Telephone Encounter (Signed)
   [facility] Pre-operative Risk Assessment    Request for surgical clearance:  1. What type of surgery is being performed? L3 LAMINECTOMY, EXCISION OF INTRADURAL TUMOR   2. When is this surgery scheduled? TBD   3. What type of clearance is required (medical clearance vs. Pharmacy clearance to hold med vs. Both)? BOTH  4. Are there any medications that need to be held prior to surgery and how long?WARFARIN AND PLAVIX   5. Practice name and name of physician performing surgery? [facility]; DR. Herbie Baltimore NUDELMAN   6. What is your office phone number [phone]    7.   What is your office fax number [phone]  8.   Anesthesia type (None, local, MAC, general) ? GENERAL   Julaine Hua 05/20/2018, 4:07 PM  _________________________________________________________________   (provider comments below)

## 2018-05-20 NOTE — Telephone Encounter (Signed)
Patient with diagnosis of LV thrombus post MI on warfarin for anticoagulation.    Procedure: Laminectomy, excision of intrdural tumor Date of procedure: TBD  CrCl 67 Platelet count 338  LV thrombus occurred just under 3 months ago.  Because of the short time since event, would recommend bridging with enxoaparin. Will defer to Dr. Tamala Julian for final decision.

## 2018-05-20 NOTE — Telephone Encounter (Signed)
   Primary Cardiologist: Sinclair Grooms, MD  Chart reviewed as part of pre-operative protocol coverage. Pt has upcomming appt w/ Dr. Tamala Julian on 05/27/18. Surgical clearance to be addressed at that visit.   In the meantime, will route to pharmacy to address coumadin.   Lyda Jester, PA-C 05/20/2018, 4:19 PM

## 2018-05-21 ENCOUNTER — Ambulatory Visit (HOSPITAL_COMMUNITY): Payer: BLUE CROSS/BLUE SHIELD

## 2018-05-21 ENCOUNTER — Encounter (HOSPITAL_COMMUNITY): Payer: BLUE CROSS/BLUE SHIELD

## 2018-05-24 ENCOUNTER — Encounter (HOSPITAL_COMMUNITY)
Admission: RE | Admit: 2018-05-24 | Discharge: 2018-05-24 | Disposition: A | Discharge status: Home / Self Care | Payer: BLUE CROSS/BLUE SHIELD | Source: Ambulatory Visit | Attending: Interventional Cardiology | Admitting: Interventional Cardiology

## 2018-05-24 ENCOUNTER — Ambulatory Visit (HOSPITAL_COMMUNITY): Payer: BLUE CROSS/BLUE SHIELD

## 2018-05-24 DIAGNOSIS — Z955 Presence of coronary angioplasty implant and graft: Secondary | ICD-10-CM | POA: Diagnosis not present

## 2018-05-24 DIAGNOSIS — I213 ST elevation (STEMI) myocardial infarction of unspecified site: Secondary | ICD-10-CM

## 2018-05-24 NOTE — Progress Notes (Signed)
Jack [name] 53 y.o. male Nutrition Note Spoke with pt. Nutrition Plan and Nutrition Survey goals reviewed with pt. Pt is following a Heart Healthy diet, continues to desire weight loss (note pt is on prednisone). Pt has been trying to lose wt by eating low carb and using 3 meal replacements a day. Reminded pt of last discussion, and previous recommendation of incorporated more food based episodes as pt has tendency to overeat and identify with binge eating in the evening. Additional weight loss tips reviewed (label reading, how to build a healthy plate, portion sizes, importance of weighing and measuring foods, eating frequently across the day, food journaling). Revisited previous recommendation that pt weigh and measure portion sizes and develop a regular set schedule for eating with more food based episodes instead of convenient meal replacements. Reviewed the importance of weighing and measuring for accuracy with pt. Pt was resistant to idea at this time. Pt expressed understanding of the information reviewed. Pt aware of nutrition education classes offered and would like to attend nutrition classes.  Lab Results  Component Value Date   HGBA1C 5.2 02/20/2018    Wt Readings from Last 3 Encounters:  05/11/18 174 lb 2.6 oz (79 kg)  05/05/18 160 lb (72.6 kg)  04/26/18 171 lb 4 oz (77.7 kg)    Nutrition Diagnosis ? Food-and nutrition-related knowledge deficit related to lack of exposure to information as related to diagnosis of: ? CVD  ? Overweight  related to excessive energy intake as evidenced by a BMI 27.28  Nutrition Intervention ? Pt's individual nutrition plan reviewed with pt. ? Benefits of adopting Heart Healthy diet discussed when Medficts reviewed.    Goal(s) ? Pt to identify food quantities necessary to achieve weight loss of 6-24 lb at graduation from cardiac rehab.  ? Pt to build a healthy plate including vegetables, fruits, whole grains, and low-fat dairy products in a  heart healthy meal plan. ? Pt to weigh and measure serving sizes ? Pt to eat a variety of non-starchy vegetables.  Plan:   Pt to attend nutrition classes ? Nutrition I ? Nutrition II ? Portion Distortion   Will provide client-centered nutrition education as part of interdisciplinary care  Monitor and evaluate progress toward nutrition goal with team.    Laurina Bustle, MS, RD, LDN 05/24/2018 9:07 AM

## 2018-05-24 NOTE — Telephone Encounter (Signed)
I would recommend postponing surgery until the patient is a full 6 months out from her acute coronary syndrome/anterior MI.  This will significantly decrease the risk of stent thrombosis and also allow maximal healing of LV thrombus.  If the operation is time sensitive, 3 months post procedure Plavix could be discontinued and then resumed as soon as possible after the procedure.  Coumadin therapy could be held without Lovenox bridge especially since the patient is having a cervical spine operation.

## 2018-05-25 NOTE — Telephone Encounter (Signed)
   Primary Cardiologist: Sinclair Grooms, MD  Chart reviewed as part of pre-operative protocol coverage. Patient was contacted 05/25/2018 in reference to pre-operative risk assessment for pending surgery as outlined below.  Jack [name] reports that his surgery is not scheduled until May. Since this is more that 2 months away we cannot provide clearance at this time. The patient has an appointment with Dr. Tamala Julian on 05/27/18 and will discuss the surgery with him at that appointment.   Please contact our office closer to the procedure for clearance and anticoagulation recommendations.   I will efax this to the requesting provider and also route to Dr. Tamala Julian for his information.   Daune Perch, NP 05/25/2018, 10:30 AM

## 2018-05-26 ENCOUNTER — Ambulatory Visit (HOSPITAL_COMMUNITY): Payer: BLUE CROSS/BLUE SHIELD

## 2018-05-26 ENCOUNTER — Encounter (HOSPITAL_COMMUNITY)
Admission: RE | Admit: 2018-05-26 | Discharge: 2018-05-26 | Disposition: A | Discharge status: Home / Self Care | Payer: BLUE CROSS/BLUE SHIELD | Source: Ambulatory Visit | Attending: Interventional Cardiology | Admitting: Interventional Cardiology

## 2018-05-26 DIAGNOSIS — Z955 Presence of coronary angioplasty implant and graft: Secondary | ICD-10-CM | POA: Diagnosis not present

## 2018-05-26 DIAGNOSIS — I213 ST elevation (STEMI) myocardial infarction of unspecified site: Secondary | ICD-10-CM

## 2018-05-27 ENCOUNTER — Ambulatory Visit: Payer: BLUE CROSS/BLUE SHIELD | Admitting: Interventional Cardiology

## 2018-05-27 ENCOUNTER — Encounter: Payer: Self-pay | Admitting: Interventional Cardiology

## 2018-05-27 ENCOUNTER — Telehealth: Payer: Self-pay | Admitting: *Deleted

## 2018-05-27 ENCOUNTER — Ambulatory Visit (HOSPITAL_COMMUNITY): Payer: BLUE CROSS/BLUE SHIELD | Attending: Internal Medicine

## 2018-05-27 ENCOUNTER — Ambulatory Visit (INDEPENDENT_AMBULATORY_CARE_PROVIDER_SITE_OTHER): Payer: BLUE CROSS/BLUE SHIELD | Admitting: Pharmacist

## 2018-05-27 VITALS — BP 132/78 | HR 70 | Ht 67.0 in | Wt 174.2 lb

## 2018-05-27 DIAGNOSIS — I241 Dressler's syndrome: Secondary | ICD-10-CM | POA: Insufficient documentation

## 2018-05-27 DIAGNOSIS — Z5181 Encounter for therapeutic drug level monitoring: Secondary | ICD-10-CM | POA: Diagnosis not present

## 2018-05-27 DIAGNOSIS — I236 Thrombosis of atrium, auricular appendage, and ventricle as current complications following acute myocardial infarction: Secondary | ICD-10-CM

## 2018-05-27 DIAGNOSIS — E785 Hyperlipidemia, unspecified: Secondary | ICD-10-CM

## 2018-05-27 DIAGNOSIS — I2102 ST elevation (STEMI) myocardial infarction involving left anterior descending coronary artery: Secondary | ICD-10-CM

## 2018-05-27 DIAGNOSIS — I255 Ischemic cardiomyopathy: Secondary | ICD-10-CM | POA: Diagnosis not present

## 2018-05-27 DIAGNOSIS — I2510 Atherosclerotic heart disease of native coronary artery without angina pectoris: Secondary | ICD-10-CM | POA: Diagnosis not present

## 2018-05-27 DIAGNOSIS — Z7901 Long term (current) use of anticoagulants: Secondary | ICD-10-CM

## 2018-05-27 DIAGNOSIS — I10 Essential (primary) hypertension: Secondary | ICD-10-CM

## 2018-05-27 DIAGNOSIS — I5022 Chronic systolic (congestive) heart failure: Secondary | ICD-10-CM

## 2018-05-27 DIAGNOSIS — I309 Acute pericarditis, unspecified: Secondary | ICD-10-CM

## 2018-05-27 DIAGNOSIS — G4730 Sleep apnea, unspecified: Secondary | ICD-10-CM | POA: Diagnosis not present

## 2018-05-27 DIAGNOSIS — M79604 Pain in right leg: Secondary | ICD-10-CM

## 2018-05-27 DIAGNOSIS — G4733 Obstructive sleep apnea (adult) (pediatric): Secondary | ICD-10-CM | POA: Insufficient documentation

## 2018-05-27 LAB — POCT INR: INR: 2.3 (ref 2.0–3.0)

## 2018-05-27 MED ORDER — PERFLUTREN LIPID MICROSPHERE
1.0000 mL | INTRAVENOUS | Status: AC | PRN
  Administered 2018-05-27: 2 mL via INTRAVENOUS

## 2018-05-27 MED ORDER — CARVEDILOL 6.25 MG PO TABS: 6.2500 mg | ORAL_TABLET | Freq: Two times a day (BID) | ORAL | 3 refills | Status: DC

## 2018-05-27 NOTE — Patient Instructions (Signed)
Medication Instructions:  1) INCREASE Carvedilol to 6.8m twice daily 2) DISCONTINUE Colchicine  If you need a refill on your cardiac medications before your next appointment, please call your pharmacy.   Lab work: None If you have labs (blood work) drawn today and your tests are completely normal, you will receive your results only by: .Marland KitchenMyChart Message (if you have MyChart) OR . A paper copy in the mail If you have any lab test that is abnormal or we need to change your treatment, we will call you to review the results.  Testing/Procedures: Your physician has recommended that you have a sleep study. This test records several body functions during sleep, including: brain activity, eye movement, oxygen and carbon dioxide blood levels, heart rate and rhythm, breathing rate and rhythm, the flow of air through your mouth and nose, snoring, body muscle movements, and chest and belly movement.   Follow-Up: [facility] and your [facility] needs are our [facility].  As part of our continuing mission to provide you with exceptional heart care, we have created designated Provider Care Teams.  These Care Teams include your primary Cardiologist (physician) and Advanced Practice Providers (APPs -  Physician Assistants and Nurse Practitioners) who all work together to provide you with the care you need, when you need it. You will need a follow up appointment in 4-6 weeks.  Please call our office 2 months in advance to schedule this appointment.  You may see HSinclair Grooms MD or one of the following Advanced Practice Providers on your designated Care Team:   LTruitt Merle NP LCecilie Kicks NP . JKathyrn Drown NP  Any Other Special Instructions Will Be Listed Below (If Applicable).

## 2018-05-27 NOTE — Patient Instructions (Signed)
Continue taking 1 tablet daily except 1/2 tablet on Tuesdays and Saturdays.  Recheck INR in 1 week. Coumadin Clinic [phone] Main [phone]

## 2018-05-27 NOTE — Progress Notes (Addendum)
Cardiology Office Note:    Date:  05/27/2018   ID:  Jack [name], DOB [birth year], MRN [id number]  PCP:  [name], MD  Cardiologist:  Sinclair Grooms, MD   Referring MD: [name], MD   Chief Complaint  Patient presents with  . Coronary Artery Disease  . Congestive Heart Failure    History of Present Illness:    Jack [name] is a 53 y.o. male with a hx of eosinophilic granulomatosis, history of GI bleeding, essential hypertension, seizure disorder, Crohn's disease, who presented with ST elevation myocardial infarction involving the LAD territory in September 2019.  Alternate decrease in LV systolic function with EF approximately 35% and LV thrombus requiring Coumadin therapy.  Received a drug-eluting stent to the LAD and has residual moderate CAD in circumflex and RCA territory.  Developed post infarct pericarditis.  Has upcoming surgery for lumbar schwannoma resection.  Denies angina pectoris.  Wants to know we would want to "fix his other blocked arteries".  He has not needed to use nitroglycerin.  Cardiac rehab is started.  Heart rate increases rapidly and therefore he is not able to get much physical activity in.  He denies orthopnea, PND, lower extremity swelling, and DOE.  No specific medication side effects.  Had apical thrombus.  Had post infarct pericarditis.  The patient snores, has daytime fatigue, nocturia, and dry throat each AM.  Past Medical History:  Diagnosis Date  . BPH (benign prostatic hypertrophy)   . CAD (coronary artery disease)    a. anterior STEMI 01/2018 -  proximal occlusion of LAD, treated with DES. Cath also showed 20% distal LM, 95% ostial-prox small-moderate ramus, 70-80% ostial Cx, 70% dominant ostial OM, 50-60% prox Cx, 70% RCA. EF 35% by cath with LVEDP 26mHg. Med rx for residual disease. Course complicated by post MI pericarditis and LV thrombus.  . Chronic systolic (congestive) heart failure ([place]   . Colitis   .  Colon polyp    inflammatory  . Crohn disease ([place] 1992   history uveitis, involvement of intestines and lungs  . Diverticulosis   . Eosinophilic granuloma ([facility]   . Essential hypertension   . GERD (gastroesophageal reflux disease)   . GI bleeding   . History of chicken pox   . History of gastroesophageal reflux (GERD)   . History of seizure 1995   grand mal x1, completed 6 yrs dilantin. no seizures since  . Hyperlipidemia   . Internal hemorrhoids   . Ischemic cardiomyopathy   . LV (left ventricular) mural thrombus following MI ([place] 01/2018  . Osteoporosis 11/2015   DEXA T -2.9  . Schwannoma 2007   L axilla s/p surgery  . Seizures ([facility]    last seizure 1995 and only x 1 seizure-   . Ulnar neuropathy    h/o this from L arm schwannoma    Past Surgical History:  Procedure Laterality Date  . APPENDECTOMY  2000  . CARDIAC CATHETERIZATION    . CARDIOVASCULAR STRESS TEST  01/2015   low risk study  . CHOLECYSTECTOMY  2000  . COLONOSCOPY  08/2014   f/u crohn's, 4 inflammatory polyps, diverticulosis, improved, rpt 2 yrs ([place])  . COLONOSCOPY WITH PROPOFOL N/A 04/09/2018   Procedure: COLONOSCOPY WITH PROPOFOL Hot Biopsy;  Surgeon: DDoran Stabler MD;  [facility]  Service: Gastroenterology;  Laterality: N/A;  . CORONARY ANGIOGRAPHY N/A 02/21/2018   Procedure: CORONARY ANGIOGRAPHY;  Surgeon: SBelva Crome MD;  [facility];  Service: Cardiovascular;  Laterality: N/A;  . CORONARY/GRAFT ACUTE MI REVASCULARIZATION N/A 02/20/2018   Procedure: Coronary/Graft Acute MI Revascularization;  Surgeon: Belva Crome, MD;  Location: [place] [facility];  Service: Cardiovascular;  Laterality: N/A;  . ESOPHAGOGASTRODUODENOSCOPY  07/2014   h/o EE resolved, focal reflux esophagitis, chronic active gastritis  . extremity surgery Left    ulnar nerve repair after schwannoma removal  . FLEXIBLE SIGMOIDOSCOPY N/A 04/14/2018   Procedure: FLEXIBLE SIGMOIDOSCOPY;   Surgeon: Milus Banister, MD;  Location: [facility] ENDOSCOPY;  Service: Endoscopy;  Laterality: N/A;  . INGUINAL HERNIA REPAIR Bilateral 2017  . LEFT HEART CATH AND CORONARY ANGIOGRAPHY N/A 02/20/2018   Procedure: LEFT HEART CATH AND CORONARY ANGIOGRAPHY;  Surgeon: Belva Crome, MD;  Location: [place] [facility] LAB;  Service: Cardiovascular;  Laterality: N/A;  . LUMBAR SPINE SURGERY  2011  . POLYPECTOMY  04/09/2018   Procedure: POLYPECTOMY;  Surgeon: Doran Stabler, MD;  Location: [facility] ENDOSCOPY;  Service: Gastroenterology;;  . SPINAL CORD STIMULATOR IMPLANT  2012   x2 (Dr Berton Mount)  . SUBMUCOSAL INJECTION  04/09/2018   Procedure: SUBMUCOSAL INJECTION;  Surgeon: Doran Stabler, MD;  Location: [facility] ENDOSCOPY;  Service: Gastroenterology;;  . TONSILLECTOMY  1996  . TUMOR REMOVAL Left 2007   schwannoma from L armpit  . UPPER GASTROINTESTINAL ENDOSCOPY      Current Medications: Current Meds  Medication Sig  . acetaminophen (TYLENOL) 325 MG tablet Take 650 mg by mouth every 6 (six) hours as needed for mild pain or headache.   . alendronate (FOSAMAX) 70 MG tablet Take 70 mg by mouth every Saturday.   [name] atorvastatin (LIPITOR) 80 MG tablet Take 1 tablet (80 mg total) by mouth daily at 6 PM.  . Calcium Carbonate-Vit D-Min (CALCIUM 1200 PO) Take 1 capsule by mouth 2 (two) times daily.  . clopidogrel (PLAVIX) 75 MG tablet Take 1 tablet (75 mg total) by mouth daily.  . diphenoxylate-atropine (LOMOTIL) 2.5-0.025 MG tablet Take 1-2 tablets by mouth 4 times daily as needed for diarrhea  . furosemide (LASIX) 20 MG tablet Take only as needed  . gabapentin (NEURONTIN) 300 MG capsule Take one capsule TID  . HYDROcodone-acetaminophen (NORCO/VICODIN) 5-325 MG tablet Take 1 tablet by mouth every 6 (six) hours as needed. for pain  . hydrOXYzine (ATARAX/VISTARIL) 25 MG tablet TAKE 4 TABLETS (100 MG TOTAL) BY MOUTH AT BEDTIME.  [name] losartan (COZAAR) 25 MG tablet Take 25 mg by mouth daily.  . Multiple Vitamin  (MULTIVITAMIN) capsule Take 1 capsule by mouth daily.  . nitroGLYCERIN (NITROSTAT) 0.4 MG SL tablet Place 1 tablet (0.4 mg total) under the tongue every 5 (five) minutes as needed for chest pain.  . pantoprazole (PROTONIX) 40 MG tablet Take 1 tablet (40 mg total) by mouth 2 (two) times daily.  . predniSONE (DELTASONE) 1 MG tablet Take 1 mg by mouth See admin instructions. Take 1 mg by mouth along with 27m and 5 mg tablet to equal 153mtotal.  . predniSONE (DELTASONE) 10 MG tablet Take 10 mg by mouth See admin instructions. Take 10 by mouth along with 58m17mnd 5 mg tablet to equal 63m88mtal.  . predniSONE (DELTASONE) 5 MG tablet Take 5 mg by mouth See admin instructions. Take 5 mg by mouth along with 58mg 83m 10mg 85met to equal 63mg t358m.  . spironolactone (ALDACTONE) 25 MG tablet Take 0.5 tablets (12.5 mg total) by mouth daily.  . tamsulosin (FLOMAX) 0.4 MG CAPS capsule TAKE 1 CAPSULE (  0.4 MG TOTAL) BY MOUTH DAILY.  . vedolizumab (ENTYVIO) 300 MG injection Inject 300 mg into the vein every 8 (eight) weeks.   [name] warfarin (COUMADIN) 5 MG tablet Take 5 mg tonight and tomorrow night and then back to home regimen of 5 mg daily on Monday Wednesday Friday and Sunday and 2.5 mg daily on Tuesday Thursday Saturday. -INR check is on Friday 1/17 (Patient taking differently: 5 mg daily on Monday Wednesday Friday and Sunday and 2.5 mg daily on Tuesday Thursday Saturday)  . [DISCONTINUED] carvedilol (COREG) 3.125 MG tablet Take 1 tablet (3.125 mg total) by mouth 2 (two) times daily with a meal.  . [DISCONTINUED] colchicine 0.6 MG tablet Take 1 tablet (0.6 mg total) by mouth daily.     Allergies:   Cephalexin and Duloxetine   Social History   Socioeconomic History  . Marital status: Married    Spouse name: Mickel Baas  . Number of children: Not on file  . Years of education: 3  . Highest education level: Bachelor's degree (e.g., BA, AB, BS)  Occupational History  . Occupation: Self-employed    Comment:  Patient owns [employer]  . Financial resource strain: Not hard at all  . Food insecurity:    Worry: Never true    Inability: Never true  . Transportation needs:    Medical: No    Non-medical: No  Tobacco Use  . Smoking status: Former Smoker    Types: Cigars    Last attempt to quit: 02/20/2018    Years since quitting: 0.2  . Smokeless tobacco: Never Used  Substance and Sexual Activity  . Alcohol use: Yes    Alcohol/week: 7.0 standard drinks    Types: 7 Cans of beer per week    Comment: 1 beer/day  . Drug use: No  . Sexual activity: Not on file  Lifestyle  . Physical activity:    Days per week: 0 days    Minutes per session: 0 min  . Stress: Only a little  Relationships  . Social connections:    Talks on phone: Not on file    Gets together: Not on file    Attends religious service: Not on file    Active member of club or organization: Not on file    Attends meetings of clubs or organizations: Not on file    Relationship status: Not on file  Other Topics Concern  . Not on file  Social History Narrative   Lives with wife, 1 dog. Grown children   Edu: college   Occ: owns mattress store   Activity: active at work, started [employer], wants to restart running   Diet: good water, fruits/vegetables daily     Family History: The patient's family history includes CAD (age of onset: 101) in his father and mother; Cancer in his maternal grandmother; Colon cancer in his maternal grandfather; Congenital heart disease in his mother; Hyperlipidemia in his father and mother; Hypertension in his father and mother. There is no history of Diabetes, Colon polyps, Esophageal cancer, Rectal cancer, or Stomach cancer.  ROS:   Please see the history of present illness.  Significant snoring according to his wife. He has had rectal bleeding all other systems reviewed and are negative.  EKGs/Labs/Other Studies Reviewed:    The following studies were reviewed today: I  briefly reviewed the echocardiogram done today.  LVEF is still in the 35% range.  I do not see an LV thrombus.  Will await official interpretation.  EKG:  EKG not repeated today  Recent Labs: 02/20/2018: B Natriuretic Peptide 14.9; TSH 1.467 03/22/2018: NT-Pro BNP 2,573 04/07/2018: ALT 32 04/12/2018: Magnesium 2.1 04/20/2018: Hemoglobin 13.2; Platelets 338 04/28/2018: BUN 26; Creatinine, Ser 1.45; Potassium 4.5; Sodium 138  Recent Lipid Panel    Component Value Date/Time   CHOL 144 04/28/2018 0910   TRIG 154 (H) 04/28/2018 0910   HDL 38 (L) 04/28/2018 0910   CHOLHDL 3.8 04/28/2018 0910   CHOLHDL 4.5 02/20/2018 2345   VLDL 8 02/20/2018 2345   LDLCALC 75 04/28/2018 0910    Physical Exam:    VS:  BP 132/78   Pulse 70   Ht 5' 7"  (1.702 m)   Wt 174 lb 3.2 oz (79 kg)   SpO2 100%   BMI 27.28 kg/m     Wt Readings from Last 3 Encounters:  05/27/18 174 lb 3.2 oz (79 kg)  05/11/18 174 lb 2.6 oz (79 kg)  05/05/18 160 lb (72.6 kg)     GEN: Appearance is compatible with stated age.. No acute distress HEENT: Normal NECK: No JVD. LYMPHATICS: No lymphadenopathy CARDIAC: RRR.  No murmur, no gallop, no edema VASCULAR: 2+ bilateral radial and carotid pulses, no bruits RESPIRATORY:  Clear to auscultation without rales, wheezing or rhonchi  ABDOMEN: Soft, non-tender, non-distended, No pulsatile mass, MUSCULOSKELETAL: No deformity  SKIN: Warm and dry NEUROLOGIC:  Alert and oriented x 3 PSYCHIATRIC:  Normal affect   ASSESSMENT:    1. CAD in native artery   2. Sleep apnea, unspecified type   3. Chronic systolic CHF (congestive heart failure) ([place])   4. LV (left ventricular) mural thrombus following MI ([place])   5. Warfarin anticoagulation   6. Hyperlipidemia, unspecified hyperlipidemia type   7. Essential hypertension   8. Ischemic cardiomyopathy   9. Acute pericarditis, unspecified type    PLAN:    In order of problems listed above:  1. Stable without angina.  Has significant  residual coronary disease involving RCA and circumflex.  Discussed with the patient initial attempts at medical therapy and intervention on coronaries with stents only if symptoms develop.  Given his history of vasculitis, stent patency would be a concern. 2. Sleep study will be done to exclude obstructive sleep apnea.  I am almost certain that this is a significant risk factor. 3. Estimated LVEF looks to be unchanged from December.  Will further increase beta-blocker intensity to carvedilol 6.25 mg twice daily.  Return in 1 month for consideration of switch over from losartan to Entresto 4. Continue Coumadin.  I did not see thrombus on echo today.  Intention is to treat for 6 months and then discontinue Coumadin therapy.  This will probably be done as the patient prepares for surgical intervention on his lower back. 5. Continue Coumadin for at least another 2-1/2 months unless bleeding.  He is at high risk for bleeding and has had difficulty with hematochezia since being on both Plavix and warfarin. 6. LDL target less than 70. 7. Target blood pressure 130/80 mmHg or less. 8. Already discussed under systolic heart failure 9. Discontinue colchicine  Overall education and awareness concerning primary/secondary risk prevention was discussed in detail: LDL less than 70, hemoglobin A1c less than 7, blood pressure target less than 130/80 mmHg, >150 minutes of moderate aerobic activity per week, avoidance of smoking, weight control (via diet and exercise), and continued surveillance/management of/for obstructive sleep apnea.  1 month follow-up   Medication Adjustments/Labs and Tests Ordered: Current medicines are reviewed at length  with the patient today.  Concerns regarding medicines are outlined above.  Orders Placed This Encounter  Procedures  . Split night study   Meds ordered this encounter  Medications  . carvedilol (COREG) 6.25 MG tablet    Sig: Take 1 tablet (6.25 mg total) by mouth 2 (two)  times daily.    Dispense:  180 tablet    Refill:  3    Dose change    Patient Instructions  Medication Instructions:  1) INCREASE Carvedilol to 6.82m twice daily 2) DISCONTINUE Colchicine  If you need a refill on your cardiac medications before your next appointment, please call your pharmacy.   Lab work: None If you have labs (blood work) drawn today and your tests are completely normal, you will receive your results only by: .Marland KitchenMyChart Message (if you have MyChart) OR . A paper copy in the mail If you have any lab test that is abnormal or we need to change your treatment, we will call you to review the results.  Testing/Procedures: Your physician has recommended that you have a sleep study. This test records several body functions during sleep, including: brain activity, eye movement, oxygen and carbon dioxide blood levels, heart rate and rhythm, breathing rate and rhythm, the flow of air through your mouth and nose, snoring, body muscle movements, and chest and belly movement.   Follow-Up: [facility] you and your health needs are our priority.  As part of our continuing mission to provide you with exceptional heart care, we have created designated Provider Care Teams.  These Care Teams include your primary Cardiologist (physician) and Advanced Practice Providers (APPs -  Physician Assistants and Nurse Practitioners) who all work together to provide you with the care you need, when you need it. You will need a follow up appointment in 4-6 weeks.  Please call our office 2 months in advance to schedule this appointment.  You may see HSinclair Grooms MD or one of the following Advanced Practice Providers on your designated Care Team:   LTruitt Merle NP LCecilie Kicks NP . JKathyrn Drown NP  Any Other Special Instructions Will Be Listed Below (If Applicable).       Signed, HSinclair Grooms MD  05/27/2018 12:35 PM    [place]

## 2018-05-27 NOTE — Telephone Encounter (Signed)
-----   Message from Loren Racer, LPN sent at 8/67/6195 12:19 PM EST ----- Sleep study ordered for snoring/sleep apnea.

## 2018-05-28 ENCOUNTER — Telehealth: Payer: Self-pay | Admitting: *Deleted

## 2018-05-28 ENCOUNTER — Ambulatory Visit (HOSPITAL_COMMUNITY): Payer: BLUE CROSS/BLUE SHIELD

## 2018-05-28 ENCOUNTER — Encounter (HOSPITAL_COMMUNITY)
Admission: RE | Admit: 2018-05-28 | Discharge: 2018-05-28 | Disposition: A | Discharge status: Home / Self Care | Payer: BLUE CROSS/BLUE SHIELD | Source: Ambulatory Visit | Attending: Interventional Cardiology | Admitting: Interventional Cardiology

## 2018-05-28 DIAGNOSIS — Z955 Presence of coronary angioplasty implant and graft: Secondary | ICD-10-CM | POA: Diagnosis not present

## 2018-05-28 DIAGNOSIS — I213 ST elevation (STEMI) myocardial infarction of unspecified site: Secondary | ICD-10-CM

## 2018-05-28 NOTE — Telephone Encounter (Signed)
Staff message sent to [facility] ok to schedule sleep study. [employer] auth received. Auth # [id number]. Valid dates 05/28/18 to 07/26/18.

## 2018-05-28 NOTE — Telephone Encounter (Signed)
-----   Message from Loren Racer, LPN sent at 6/85/4883 12:19 PM EST ----- Sleep study ordered for snoring/sleep apnea.

## 2018-05-31 ENCOUNTER — Telehealth: Payer: Self-pay | Admitting: Cardiology

## 2018-05-31 ENCOUNTER — Telehealth: Payer: Self-pay | Admitting: *Deleted

## 2018-05-31 ENCOUNTER — Ambulatory Visit (HOSPITAL_COMMUNITY): Payer: BLUE CROSS/BLUE SHIELD

## 2018-05-31 ENCOUNTER — Encounter (HOSPITAL_COMMUNITY)
Admission: RE | Admit: 2018-05-31 | Discharge: 2018-05-31 | Disposition: A | Discharge status: Home / Self Care | Payer: BLUE CROSS/BLUE SHIELD | Source: Ambulatory Visit | Attending: Interventional Cardiology | Admitting: Interventional Cardiology

## 2018-05-31 ENCOUNTER — Encounter: Payer: Self-pay | Admitting: Family Medicine

## 2018-05-31 DIAGNOSIS — Z955 Presence of coronary angioplasty implant and graft: Secondary | ICD-10-CM | POA: Diagnosis not present

## 2018-05-31 DIAGNOSIS — I213 ST elevation (STEMI) myocardial infarction of unspecified site: Secondary | ICD-10-CM | POA: Diagnosis not present

## 2018-05-31 LAB — GLUCOSE, CAPILLARY: Glucose-Capillary: 86 mg/dL (ref 70–99)

## 2018-05-31 NOTE — Telephone Encounter (Signed)
Staff message sent to [place] received. Ok to schedule sleep study. [employer] Auth # [id number]. Valid dates 05/28/18 to 07/26/18.

## 2018-05-31 NOTE — Telephone Encounter (Signed)
-----   Message from Belva Crome, MD sent at 05/29/2018  5:15 PM EST ----- I updated my note. ----- Message ----- From: Lauralee Evener, CMA Sent: 05/28/2018   3:47 PM EST To: Belva Crome, MD  Dr Tamala Julian,  If you don't mind will you amend your office note to include if the patient has daytime somnolence, snoring etc. Although he has cardiac issues that may be due to OSA the insurance company still want symptoms documented if they have them. Something this simple will keep them from authorizing CPAP equipment if needed. Thanks.   Barry Brunner, CMA; [facility]

## 2018-05-31 NOTE — Progress Notes (Addendum)
Daily Session Note  Patient Details  Name: Jack [name] MRN: [id number] Date of Birth: [birth year] Referring Provider:   Flowsheet Row CARDIAC REHAB PHASE II ORIENTATION from 05/11/2018 in [place]  Referring Provider  Dr. Tamala Julian      Encounter Date: 05/31/2018  Check In: Session Check In - 05/31/18 0840    Check-In          Supervising physician immediately available to respond to emergencies  [facility] immediately available    Physician(s)  Dr. Maylene Roes    Location  [facility]    Staff Present  Andi Hence, RN, Mosie Epstein, MS,ACSM CEP, Exercise Physiologist;Dalton Kris Mouton, MS, Exercise Physiologist;Molly DiVincenzo, MS, ACSM RCEP, Exercise Physiologist;Brittany Durene Fruits, BS, ACSM CEP, Exercise Physiologist    Medication changes reported      No    Fall or balance concerns reported     No    Tobacco Cessation  No Change    Warm-up and Cool-down  Performed as group-led instruction    Resistance Training Performed  Yes    VAD Patient?  No    PAD/SET Patient?  No        Pain Assessment          Currently in Pain?  No/denies    Pain Score  0-No pain    Multiple Pain Sites  No           Capillary Blood Glucose: Results for orders placed or performed during the hospital encounter of 05/31/18 (from the past 24 hour(s))  Glucose, capillary     Status: None   Collection Time: 05/31/18  9:16 AM  Result Value Ref Range   Glucose-Capillary 86 70 - 99 mg/dL      Social History   Tobacco Use  Smoking Status Former Smoker  . Types: Cigars  . Last attempt to quit: 02/20/2018  . Years since quitting: 0.2  Smokeless Tobacco Never Used    Goals Met:  Independence with exercise equipment  Goals Unmet:  BP  Comments: pt c/o dizziness and weakness, pt clammy with pallor.  post exercise at cardiac rehab. Pt states he has had diarrhea 3-4 days from crohns disease.  Pt did not eat breakfast this morning.   Telemetry-sinus rhythm, BP:  74/35. CBG:  86.  Pt placed in supine position with feet elevated. Recheck BP:  83/50. Pt given gatorade and banana. Recheck BP:  105/58.   Symptoms resolved.  Pt placed in seated position, recheck BP:  98/58.  Standing BP:  Pt wife notified.  Pt walked around track without recurrence of symptoms.  Final BP:  111/58.  Cardmaster notified.  [name],  NP  made aware.  New orders received to [place].  Check BP in AM.  If BP remains low, call office for further instructions.  Written and verbal instructions given.  Pt verbalized understanding.   Andi Hence, RN, BSN Cardiac Pulmonary Rehab    Dr. Fransico Him is Medical Director for Cardiac Rehab at [facility].

## 2018-05-31 NOTE — Telephone Encounter (Signed)
Received page from CR about patient's blood pressure dropping with therapy today. Pressure lowest at 76 systolic but now improved. Has had several episode of loose stools with his Crohn's history. Not unusual for him. Have asked that he hold his evening dose of coreg. If blood pressure improved in the morning then restart medications. Advised patient of this over the phone.   [name] NP

## 2018-06-01 DIAGNOSIS — K5090 Crohn's disease, unspecified, without complications: Secondary | ICD-10-CM | POA: Diagnosis not present

## 2018-06-01 NOTE — Telephone Encounter (Signed)
Patient is scheduled for lab study on 07/19/18. Patient understands his sleep study will be done at [facility] sleep lab. Patient understands he will receive a sleep packet in a week or so. Patient understands to call if he does not receive the sleep packet in a timely manner. Patient agrees with treatment and thanked me for call.

## 2018-06-02 ENCOUNTER — Encounter (HOSPITAL_COMMUNITY): Payer: BLUE CROSS/BLUE SHIELD

## 2018-06-02 ENCOUNTER — Ambulatory Visit (HOSPITAL_COMMUNITY): Payer: BLUE CROSS/BLUE SHIELD

## 2018-06-02 NOTE — Progress Notes (Signed)
Cardiac Individual Treatment Plan  Patient Details  Name: Jack [name] MRN: [id number] Date of Birth: June 18, 1965 Referring Provider:   Flowsheet Row CARDIAC REHAB PHASE II ORIENTATION from 05/11/2018 in [facility]  Referring Provider  Dr. Tamala Julian      Initial Encounter Date:  Flowsheet Row CARDIAC REHAB PHASE II ORIENTATION from 05/11/2018 in [facility]  Date  05/11/18      Visit Diagnosis: 02/20/18 DES LAD  02/20/18 STEMI  Patient's Home Medications on Admission:  Current Outpatient Medications:  .  acetaminophen (TYLENOL) 325 MG tablet, Take 650 mg by mouth every 6 (six) hours as needed for mild pain or headache. , Disp: , Rfl:  .  alendronate (FOSAMAX) 70 MG tablet, Take 70 mg by mouth every Saturday. , Disp: , Rfl: 11 .  atorvastatin (LIPITOR) 80 MG tablet, Take 1 tablet (80 mg total) by mouth daily at 6 PM., Disp: 30 tablet, Rfl: 3 .  Calcium Carbonate-Vit D-Min (CALCIUM 1200 PO), Take 1 capsule by mouth 2 (two) times daily., Disp: , Rfl:  .  carvedilol (COREG) 6.25 MG tablet, Take 1 tablet (6.25 mg total) by mouth 2 (two) times daily., Disp: 180 tablet, Rfl: 3 .  clopidogrel (PLAVIX) 75 MG tablet, Take 1 tablet (75 mg total) by mouth daily., Disp: 30 tablet, Rfl: 12 .  diphenoxylate-atropine (LOMOTIL) 2.5-0.025 MG tablet, Take 1-2 tablets by mouth 4 times daily as needed for diarrhea, Disp: 90 tablet, Rfl: 1 .  furosemide (LASIX) 20 MG tablet, Take only as needed, Disp: 90 tablet, Rfl: 3 .  gabapentin (NEURONTIN) 300 MG capsule, Take one capsule TID, Disp: 270 capsule, Rfl: 1 .  HYDROcodone-acetaminophen (NORCO/VICODIN) 5-325 MG tablet, Take 1 tablet by mouth every 6 (six) hours as needed. for pain, Disp: , Rfl:  .  hydrOXYzine (ATARAX/VISTARIL) 25 MG tablet, TAKE 4 TABLETS (100 MG TOTAL) BY MOUTH AT BEDTIME., Disp: 360 tablet, Rfl: 2 .  losartan (COZAAR) 25 MG tablet, Take 25 mg by mouth daily., Disp: , Rfl:  .   Multiple Vitamin (MULTIVITAMIN) capsule, Take 1 capsule by mouth daily., Disp: , Rfl:  .  nitroGLYCERIN (NITROSTAT) 0.4 MG SL tablet, Place 1 tablet (0.4 mg total) under the tongue every 5 (five) minutes as needed for chest pain., Disp: 25 tablet, Rfl: 12 .  pantoprazole (PROTONIX) 40 MG tablet, Take 1 tablet (40 mg total) by mouth 2 (two) times daily., Disp: 60 tablet, Rfl: 6 .  predniSONE (DELTASONE) 1 MG tablet, Take 1 mg by mouth See admin instructions. Take 1 mg by mouth along with 57m and 5 mg tablet to equal 154mtotal., Disp: , Rfl:  .  predniSONE (DELTASONE) 10 MG tablet, Take 10 mg by mouth See admin instructions. Take 10 by mouth along with 18m70mnd 5 mg tablet to equal 46m89mtal., Disp: , Rfl:  .  predniSONE (DELTASONE) 5 MG tablet, Take 5 mg by mouth See admin instructions. Take 5 mg by mouth along with 18mg 89m 10mg 95met to equal 46mg t218m., Disp: , Rfl: 2 .  spironolactone (ALDACTONE) 25 MG tablet, Take 0.5 tablets (12.5 mg total) by mouth daily., Disp: 90 tablet, Rfl: 3 .  tamsulosin (FLOMAX) 0.4 MG CAPS capsule, TAKE 1 CAPSULE (0.4 MG TOTAL) BY MOUTH DAILY., Disp: 90 capsule, Rfl: 3 .  vedolizumab (ENTYVIO) 300 MG injection, Inject 300 mg into the vein every 8 (eight) weeks. , Disp: , Rfl:  .  warfarin (COUMADIN) 5 MG tablet,  Take 5 mg tonight and tomorrow night and then back to home regimen of 5 mg daily on Monday Wednesday Friday and Sunday and 2.5 mg daily on Tuesday Thursday Saturday. -INR check is on Friday 1/17 (Patient taking differently: 5 mg daily on Monday Wednesday Friday and Sunday and 2.5 mg daily on Tuesday Thursday Saturday), Disp: 1 tablet, Rfl: 0 No current facility-administered medications for this encounter.   Facility-Administered Medications Ordered in Other Encounters:  .  ondansetron (ZOFRAN) 4 mg in sodium chloride 0.9 % 50 mL IVPB, 4 mg, Intravenous, Q6H PRN, Kristeen Miss, MD  Past Medical History: Past Medical History:  Diagnosis Date  . BPH (benign  prostatic hypertrophy)   . CAD (coronary artery disease)    a. anterior STEMI 01/2018 -  proximal occlusion of LAD, treated with DES. Cath also showed 20% distal LM, 95% ostial-prox small-moderate ramus, 70-80% ostial Cx, 70% dominant ostial OM, 50-60% prox Cx, 70% RCA. EF 35% by cath with LVEDP 16mHg. Med rx for residual disease. Course complicated by post MI pericarditis and LV thrombus.  . Chronic systolic (congestive) heart failure ([facility]   . Colitis   . Colon polyp    inflammatory  . Crohn disease ([facility] 1992   history uveitis, involvement of intestines and lungs  . Diverticulosis   . Eosinophilic granuloma ([facility]   . Essential hypertension   . GERD (gastroesophageal reflux disease)   . GI bleeding   . History of chicken pox   . History of gastroesophageal reflux (GERD)   . History of seizure 1995   grand mal x1, completed 6 yrs dilantin. no seizures since  . Hyperlipidemia   . Internal hemorrhoids   . Ischemic cardiomyopathy   . LV (left ventricular) mural thrombus following MI ([facility] 01/2018  . Osteoporosis 11/2015   DEXA T -2.9  . Schwannoma 2007   L axilla s/p surgery  . Seizures ([facility]    last seizure 1995 and only x 1 seizure-   . Ulnar neuropathy    h/o this from L arm schwannoma    Tobacco Use: Social History   Tobacco Use  Smoking Status Former Smoker  . Types: Cigars  . Last attempt to quit: 02/20/2018  . Years since quitting: 0.2  Smokeless Tobacco Never Used    Labs: Recent Review Flowsheet Data    Labs for ITP Cardiac and Pulmonary Rehab Latest Ref Rng & Units 02/20/2018 02/20/2018 02/20/2018 04/20/2018 04/28/2018   Cholestrol 100 - 199 mg/dL - - 224(H) 165 144   LDLCALC 0 - 99 mg/dL - - 166(H) 92 75   HDL >39 mg/dL - - 50 38(L) 38(L)   Trlycerides 0 - 149 mg/dL - - 40 175(H) 154(H)   Hemoglobin A1c 4.8 - 5.6 % - 5.2 - - -   TCO2 22 - 32 mmol/L 24 - - - -      Capillary Blood Glucose: Lab Results  Component Value Date   GLUCAP 86 05/31/2018      Exercise Target Goals: Exercise Program Goal: Individual exercise prescription set using results from initial 6 min walk test and THRR while considering  patient's activity barriers and safety.   Exercise Prescription Goal: Initial exercise prescription builds to 30-45 minutes a day of aerobic activity, 2-3 days per week.  Home exercise guidelines will be given to patient during program as part of exercise prescription that the participant will acknowledge.  Activity Barriers & Risk Stratification: Activity Barriers & Cardiac Risk Stratification - 05/11/18 01696  Activity Barriers & Cardiac Risk Stratification          Activity Barriers  None    Cardiac Risk Stratification  High           6 Minute Walk: 6 Minute Walk    6 Minute Walk    Row Name 05/11/18 1017   Phase  Initial   Distance  1800 feet   Walk Time  6 minutes   # of Rest Breaks  0   MPH  3.4   METS  4.7   RPE  12   VO2 Peak  16.61   Symptoms  No   Resting HR  70 bpm   Resting BP  102/60   Resting Oxygen Saturation   100 %   Exercise Oxygen Saturation  during 6 min walk  100 %   Max Ex. HR  93 bpm   Max Ex. BP  122/68   2 Minute Post BP  114/70          Oxygen Initial Assessment:   Oxygen Re-Evaluation:   Oxygen Discharge (Final Oxygen Re-Evaluation):   Initial Exercise Prescription: Initial Exercise Prescription - 05/11/18 1000    Date of Initial Exercise RX and Referring Provider          Date  05/11/18    Referring Provider  Dr. Tamala Julian    Expected Discharge Date  08/16/18        Treadmill          MPH  3.8    Grade  2    Minutes  10        Bike          Level  1.5    Minutes  10    METs  4.54        Elliptical          Level  2    Speed  2    Minutes  10    METs  4.5        Prescription Details          Frequency (times per week)  3    Duration  Progress to 30 minutes of continuous aerobic without signs/symptoms of physical distress        Intensity           THRR 40-80% of Max Heartrate  68-136    Ratings of Perceived Exertion  11-13        Progression          Progression  Continue to progress workloads to maintain intensity without signs/symptoms of physical distress.        Resistance Training          Training Prescription  Yes    Weight  4 lbs.     Reps  10-15           Perform Capillary Blood Glucose checks as needed.  Exercise Prescription Changes: Exercise Prescription Changes    Response to Exercise    Row Name 05/19/18 1000   Blood Pressure (Admit)  98/76   Blood Pressure (Exercise)  160/80   Blood Pressure (Exit)  114/62   Heart Rate (Admit)  83 bpm   Heart Rate (Exercise)  127 bpm   Heart Rate (Exit)  84 bpm   Rating of Perceived Exertion (Exercise)  13   Symptoms  None   Comments  Pt first day of exercise.   Duration  Continue with 30 min of aerobic exercise without  signs/symptoms of physical distress.   Intensity  THRR unchanged       Progression    Row Name 05/19/18 1000   Progression  Continue to progress workloads to maintain intensity without signs/symptoms of physical distress.   Average METs  4.1       Resistance Training    Row Name 05/19/18 1000   Training Prescription  No       Treadmill    Row Name 05/19/18 1000   MPH  3.8   Grade  2   Minutes  10       Bike    Row Name 05/19/18 1000   Level  1.5   Minutes  10   METs  4.58       NuStep    Row Name 05/19/18 1000   Level  4   SPM  85   Minutes  10   METs  2.9       Elliptical    Row Name 05/19/18 1000   Level  no documentation   Speed  no documentation   Minutes  no documentation   METs  no documentation          Exercise Comments: Exercise Comments    Row Name 05/19/18 1006 06/01/18 1420   Exercise Comments  Pt first day of exercise. Pt tolerated exercise well.   Reviewed METs and goals with Pt. Pt is tolerating exercise well.       Exercise Goals and Review: Exercise Goals    Exercise Goals    Row Name  05/11/18 0905   Increase Physical Activity  Yes   Intervention  Provide advice, education, support and counseling about physical activity/exercise needs.;Develop an individualized exercise prescription for aerobic and resistive training based on initial evaluation findings, risk stratification, comorbidities and participant's personal goals.   Expected Outcomes  Short Term: Attend rehab on a regular basis to increase amount of physical activity.   Increase Strength and Stamina  Yes   Intervention  Provide advice, education, support and counseling about physical activity/exercise needs.;Develop an individualized exercise prescription for aerobic and resistive training based on initial evaluation findings, risk stratification, comorbidities and participant's personal goals.   Expected Outcomes  Short Term: Increase workloads from initial exercise prescription for resistance, speed, and METs.   Able to understand and use rate of perceived exertion (RPE) scale  Yes   Intervention  Provide education and explanation on how to use RPE scale   Expected Outcomes  Short Term: Able to use RPE daily in rehab to express subjective intensity level;Long Term:  Able to use RPE to guide intensity level when exercising independently   Knowledge and understanding of Target Heart Rate Range (THRR)  Yes   Intervention  Provide education and explanation of THRR including how the numbers were predicted and where they are located for reference   Expected Outcomes  Short Term: Able to state/look up THRR;Long Term: Able to use THRR to govern intensity when exercising independently;Short Term: Able to use daily as guideline for intensity in rehab   Able to check pulse independently  Yes   Intervention  Provide education and demonstration on how to check pulse in carotid and radial arteries.;Review the importance of being able to check your own pulse for safety during independent exercise   Expected Outcomes  Short Term: Able to  explain why pulse checking is important during independent exercise;Long Term: Able to check pulse independently and accurately   Understanding of Exercise Prescription    Yes   Intervention  Provide education, explanation, and written materials on patient's individual exercise prescription   Expected Outcomes  Short Term: Able to explain program exercise prescription;Long Term: Able to explain home exercise prescription to exercise independently          Exercise Goals Re-Evaluation : Exercise Goals Re-Evaluation    Exercise Goal Re-Evaluation    Row Name 05/19/18 1004 06/01/18 1419   Exercise Goals Review  Increase Physical Activity;Increase Strength and Stamina;Able to understand and use rate of perceived exertion (RPE) scale;Knowledge and understanding of Target Heart Rate Range (THRR);Understanding of Exercise Prescription  Increase Physical Activity;Increase Strength and Stamina;Able to understand and use rate of perceived exertion (RPE) scale;Knowledge and understanding of Target Heart Rate Range (THRR);Understanding of Exercise Prescription   Comments  Pt first day of exercise. Pt tolerated exercise well and understands RPE scale and exercise Rx. Will progress pt as tolerated.   Reviewed METs and goals with Pt. Pt MET level is 4.5. Pt is tolerating exercise well and has a goal to join [employer]. Pt is not currently exercising at home but gets physical activity at work.    Expected Outcomes  Will continue to monitor and progress Pt as tolerated.   Will continue to monitor and progress Pt as tolerated.           Discharge Exercise Prescription (Final Exercise Prescription Changes): Exercise Prescription Changes - 05/19/18 1000    Response to Exercise          Blood Pressure (Admit)  98/76    Blood Pressure (Exercise)  160/80    Blood Pressure (Exit)  114/62    Heart Rate (Admit)  83 bpm    Heart Rate (Exercise)  127 bpm    Heart Rate (Exit)  84 bpm    Rating of Perceived  Exertion (Exercise)  13    Symptoms  None    Comments  Pt first day of exercise.    Duration  Continue with 30 min of aerobic exercise without signs/symptoms of physical distress.    Intensity  THRR unchanged        Progression          Progression  Continue to progress workloads to maintain intensity without signs/symptoms of physical distress.    Average METs  4.1        Resistance Training          Training Prescription  No        Treadmill          MPH  3.8    Grade  2    Minutes  10        Bike          Level  1.5    Minutes  10    METs  4.58        NuStep          Level  4    SPM  85    Minutes  10    METs  2.9        Elliptical          Level  --    Speed  --    Minutes  --    METs  --           Nutrition:  Target Goals: Understanding of nutrition guidelines, daily intake of sodium <1573m, cholesterol <2053m calories 30% from fat and 7% or less from saturated fats, daily to have 5 or more  servings of fruits and vegetables.  Biometrics: Pre Biometrics - 05/11/18 1018    Pre Biometrics          Height  5' 7" (1.702 m)    Weight  79 kg    Waist Circumference  37.5 inches    Hip Circumference  42 inches    Waist to Hip Ratio  0.89 %    BMI (Calculated)  27.27    Triceps Skinfold  25 mm    % Body Fat  27.8 %    Grip Strength  43 kg    Flexibility  0 in    Single Leg Stand  30 seconds            Nutrition Therapy Plan and Nutrition Goals: Nutrition Therapy & Goals - 05/12/18 0959    Nutrition Therapy          Diet  heart healthy        Personal Nutrition Goals          Nutrition Goal  Pt to identify food quantities necessary to achieve weight loss of 6-24 lb at graduation from cardiac rehab.     Personal Goal #2  Pt to build a healthy plate including vegetables, fruits, whole grains, and low-fat dairy products in a heart healthy meal plan.    Personal Goal #3  Pt to weigh and measure serving sizes    Personal Goal #4  Pt to eat  a variety of non-starchy vegetables        Intervention Plan          Intervention  Prescribe, educate and counsel regarding individualized specific dietary modifications aiming towards targeted core components such as weight, hypertension, lipid management, diabetes, heart failure and other comorbidities.    Expected Outcomes  Short Term Goal: Understand basic principles of dietary content, such as calories, fat, sodium, cholesterol and nutrients.;Long Term Goal: Adherence to prescribed nutrition plan.           Nutrition Assessments: Nutrition Assessments - 05/12/18 1000    MEDFICTS Scores          Pre Score  21           Nutrition Goals Re-Evaluation:   Nutrition Goals Re-Evaluation:   Nutrition Goals Discharge (Final Nutrition Goals Re-Evaluation):   Psychosocial: Target Goals: Acknowledge presence or absence of significant depression and/or stress, maximize coping skills, provide positive support system. Participant is able to verbalize types and ability to use techniques and skills needed for reducing stress and depression.  Initial Review & Psychosocial Screening: Initial Psych Review & Screening - 05/11/18 1133    Initial Review          Current issues with  Current Stress Concerns    Source of Stress Concerns  Chronic Illness;Occupation        Family Dynamics          Good Support System?  Yes   Jeff has his wife for support       Barriers          Psychosocial barriers to participate in program  The patient should benefit from training in stress management and relaxation.;Psychosocial barriers identified (see note)        Screening Interventions          Interventions  Encouraged to exercise    Expected Outcomes  Long Term Goal: Stressors or current issues are controlled or eliminated.           Quality of Life Scores:   Quality of Life - 05/11/18 1019    Quality of Life          Select  Quality of Life        Quality of Life Scores           Health/Function Pre  22.47 %    Socioeconomic Pre  27.69 %    Psych/Spiritual Pre  26.36 %    Family Pre  26.4 %    GLOBAL Pre  25 %          Scores of 19 and below usually indicate a poorer quality of life in these areas.  A difference of  2-3 points is a clinically meaningful difference.  A difference of 2-3 points in the total score of the Quality of Life Index has been associated with significant improvement in overall quality of life, self-image, physical symptoms, and general health in studies assessing change in quality of life.  PHQ-9: Recent Review Flowsheet Data    Depression screen [facility] 2/9 05/19/2018 03/20/2017   Decreased Interest 0 0   Down, Depressed, Hopeless 0 0   PHQ - 2 Score 0 0     Interpretation of Total Score  Total Score Depression Severity:  1-4 = Minimal depression, 5-9 = Mild depression, 10-14 = Moderate depression, 15-19 = Moderately severe depression, 20-27 = Severe depression   Psychosocial Evaluation and Intervention: Psychosocial Evaluation - 05/19/18 0936    Psychosocial Evaluation & Interventions          Interventions  Stress management education;Encouraged to exercise with the program and follow exercise prescription    Comments  pt with stress concerns from occupation and chronic illness.  otherwise no psychosocial concerns, no other interventions necessary. 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     Expected Outcomes  pt will exhibit positive outlook with improved coping skills.     Continue Psychosocial Services   No Follow up required           Psychosocial Re-Evaluation:   Psychosocial Discharge (Final Psychosocial Re-Evaluation):   Vocational Rehabilitation: Provide vocational rehab assistance to qualifying candidates.   Vocational Rehab Evaluation & Intervention: Vocational Rehab - 05/11/18 1146    Initial Vocational Rehab Evaluation & Intervention          Assessment  shows need for Vocational Rehabilitation  No   [name] does not need vocational rehab at this time as he is self employed          Education: Education Goals: Education classes will be provided on a weekly basis, covering required topics. Participant will state understanding/return demonstration of topics presented.  Learning Barriers/Preferences: Learning Barriers/Preferences - 05/11/18 1610    Learning Barriers/Preferences          Learning Barriers  Sight    Learning Preferences  Written Material;Video;Pictoral;Skilled Demonstration           Education Topics: Count Your Pulse:  -Group instruction provided by verbal instruction, demonstration, patient participation and written materials to support subject.  Instructors address importance of being able to find your pulse and how to count your pulse when at home without a heart monitor.  Patients get hands on experience counting their pulse with staff help and individually.   Heart Attack, Angina, and Risk Factor Modification:  -Group instruction provided by verbal instruction, video, and written materials to support subject.  Instructors address signs and symptoms of angina and heart attacks.    Also discuss risk  factors for heart disease and how to make changes to improve heart health risk factors.   Functional Fitness:  -Group instruction provided by verbal instruction, demonstration, patient participation, and written materials to support subject.  Instructors address safety measures for doing things around the house.  Discuss how to get up and down off the floor, how to pick things up properly, how to safely get out of a chair without assistance, and balance training.   Meditation and Mindfulness:  -Group instruction provided by verbal instruction, patient participation, and written materials to support subject.  Instructor addresses importance of mindfulness and meditation practice to help reduce stress and improve awareness.   Instructor also leads participants through a meditation exercise.    Stretching for Flexibility and Mobility:  -Group instruction provided by verbal instruction, patient participation, and written materials to support subject.  Instructors lead participants through series of stretches that are designed to increase flexibility thus improving mobility.  These stretches are additional exercise for major muscle groups that are typically performed during regular warm up and cool down.   Hands Only CPR:  -Group verbal, video, and participation provides a basic overview of AHA guidelines for community CPR. Role-play of emergencies allow participants the opportunity to practice calling for help and chest compression technique with discussion of AED use.   Hypertension: -Group verbal and written instruction that provides a basic overview of hypertension including the most recent diagnostic guidelines, risk factor reduction with self-care instructions and medication management.    Nutrition I class: Heart Healthy Eating:  -Group instruction provided by PowerPoint slides, verbal discussion, and written materials to support subject matter. The instructor gives an explanation and review of the Therapeutic Lifestyle Changes diet recommendations, which includes a discussion on lipid goals, dietary fat, sodium, fiber, plant stanol/sterol esters, sugar, and the components of a well-balanced, healthy diet.   Nutrition II class: Lifestyle Skills:  -Group instruction provided by PowerPoint slides, verbal discussion, and written materials to support subject matter. The instructor gives an explanation and review of label reading, grocery shopping for heart health, heart healthy recipe modifications, and ways to make healthier choices when eating out.   Diabetes Question & Answer:  -Group instruction provided by PowerPoint slides, verbal discussion, and written materials to support subject matter. The instructor  gives an explanation and review of diabetes co-morbidities, pre- and post-prandial blood glucose goals, pre-exercise blood glucose goals, signs, symptoms, and treatment of hypoglycemia and hyperglycemia, and foot care basics.   Diabetes Blitz:  -Group instruction provided by PowerPoint slides, verbal discussion, and written materials to support subject matter. The instructor gives an explanation and review of the physiology behind type 1 and type 2 diabetes, diabetes medications and rational behind using different medications, pre- and post-prandial blood glucose recommendations and Hemoglobin A1c goals, diabetes diet, and exercise including blood glucose guidelines for exercising safely.    Portion Distortion:  -Group instruction provided by PowerPoint slides, verbal discussion, written materials, and food models to support subject matter. The instructor gives an explanation of serving size versus portion size, changes in portions sizes over the last 20 years, and what consists of a serving from each food group.   Stress Management:  -Group instruction provided by verbal instruction, video, and written materials to support subject matter.  Instructors review role of stress in heart disease and how to cope with stress positively.     Exercising on Your Own:  -Group instruction provided by verbal instruction, power point, and written materials to support subject.  Instructors discuss benefits of exercise, components of exercise, frequency and intensity of exercise, and end points for exercise.  Also discuss use of nitroglycerin and activating EMS.  Review options of places to exercise outside of rehab.  Review guidelines for sex with heart disease.   Cardiac Drugs I:  -Group instruction provided by verbal instruction and written materials to support subject.  Instructor reviews cardiac drug classes: antiplatelets, anticoagulants, beta blockers, and statins.  Instructor discusses reasons, side  effects, and lifestyle considerations for each drug class.   Cardiac Drugs II:  -Group instruction provided by verbal instruction and written materials to support subject.  Instructor reviews cardiac drug classes: angiotensin converting enzyme inhibitors (ACE-I), angiotensin II receptor blockers (ARBs), nitrates, and calcium channel blockers.  Instructor discusses reasons, side effects, and lifestyle considerations for each drug class.   Anatomy and Physiology of the Circulatory System:  Group verbal and written instruction and models provide basic cardiac anatomy and physiology, with the coronary electrical and arterial systems. Review of: AMI, Angina, Valve disease, Heart Failure, Peripheral Artery Disease, Cardiac Arrhythmia, Pacemakers, and the ICD.   Other Education:  -Group or individual verbal, written, or video instructions that support the educational goals of the cardiac rehab program.   Holiday Eating Survival Tips:  -Group instruction provided by PowerPoint slides, verbal discussion, and written materials to support subject matter. The instructor gives patients tips, tricks, and techniques to help them not only survive but enjoy the holidays despite the onslaught of food that accompanies the holidays.   Knowledge Questionnaire Score: Knowledge Questionnaire Score - 05/11/18 0902    Knowledge Questionnaire Score          Pre Score  23/24           Core Components/Risk Factors/Patient Goals at Admission: Personal Goals and Risk Factors at Admission - 05/11/18 1205    Core Components/Risk Factors/Patient Goals on Admission           Weight Management  Yes;Weight Maintenance    Intervention  Weight Management: Develop a combined nutrition and exercise program designed to reach desired caloric intake, while maintaining appropriate intake of nutrient and fiber, sodium and fats, and appropriate energy expenditure required for the weight goal.;Weight Management: Provide  education and appropriate resources to help participant work on and attain dietary goals.    Admit Weight  174 lb 2.6 oz (79 kg)    Expected Outcomes  Short Term: Continue to assess and modify interventions until short term weight is achieved;Long Term: Adherence to nutrition and physical activity/exercise program aimed toward attainment of established weight goal;Weight Maintenance: Understanding of the daily nutrition guidelines, which includes 25-35% calories from fat, 7% or less cal from saturated fats, less than 200mg cholesterol, less than 1.5gm of sodium, & 5 or more servings of fruits and vegetables daily;Understanding recommendations for meals to include 15-35% energy as protein, 25-35% energy from fat, 35-60% energy from carbohydrates, less than 200mg of dietary cholesterol, 20-35 gm of total fiber daily;Understanding of distribution of calorie intake throughout the day with the consumption of 4-5 meals/snacks    Heart Failure  Yes    Intervention  Provide a combined exercise and nutrition program that is supplemented with education, support and counseling about heart failure. Directed toward relieving symptoms such as shortness of breath, decreased exercise tolerance, and extremity edema.    Expected Outcomes  Improve functional capacity of life;Short term: Attendance in program 2-3 days a week with increased exercise capacity. Reported lower sodium intake. Reported increased fruit   and vegetable intake. Reports medication compliance.;Short term: Daily weights obtained and reported for increase. Utilizing diuretic protocols set by physician.;Long term: Adoption of self-care skills and reduction of barriers for early signs and symptoms recognition and intervention leading to self-care maintenance.    Hypertension  Yes    Intervention  Provide education on lifestyle modifcations including regular physical activity/exercise, weight management, moderate sodium restriction and increased consumption of  fresh fruit, vegetables, and low fat dairy, alcohol moderation, and smoking cessation.;Monitor prescription use compliance.    Expected Outcomes  Short Term: Continued assessment and intervention until BP is < 140/45m HG in hypertensive participants. < 130/87mHG in hypertensive participants with diabetes, heart failure or chronic kidney disease.;Long Term: Maintenance of blood pressure at goal levels.    Lipids  Yes    Intervention  Provide education and support for participant on nutrition & aerobic/resistive exercise along with prescribed medications to achieve LDL <7023mHDL >27m64m  Expected Outcomes  Short Term: Participant states understanding of desired cholesterol values and is compliant with medications prescribed. Participant is following exercise prescription and nutrition guidelines.;Long Term: Cholesterol controlled with medications as prescribed, with individualized exercise RX and with personalized nutrition plan. Value goals: LDL < 70mg72mL > 40 mg.    Stress  Yes    Intervention  Offer individual and/or small group education and counseling on adjustment to heart disease, stress management and health-related lifestyle change. Teach and support self-help strategies.;Refer participants experiencing significant psychosocial distress to appropriate mental health specialists for further evaluation and treatment. When possible, include family members and significant others in education/counseling sessions.    Expected Outcomes  Short Term: Participant demonstrates changes in health-related behavior, relaxation and other stress management skills, ability to obtain effective social support, and compliance with psychotropic medications if prescribed.;Long Term: Emotional wellbeing is indicated by absence of clinically significant psychosocial distress or social isolation.           Core Components/Risk Factors/Patient Goals Review:  Goals and Risk Factor Review    Core Components/Risk  Factors/Patient Goals Review    Row Name 05/19/18 0938   Personal Goals Review  Weight Management/Obesity;Heart Failure;Hypertension;Lipids;Stress   Review  pt with multiple CAD RF demonstrates eagerness to participate in CR program. pt personal goals are to increase strength/stamina and decrease fatigue.  pt ultimately would like to run 1/2mara27mn.    Expected Outcomes  pt will participate in CR exercise, nutrition and lifestyle modification to decrease overall CAD RF.           Core Components/Risk Factors/Patient Goals at Discharge (Final Review):  Goals and Risk Factor Review - 05/19/18 0938    Core Components/Risk Factors/Patient Goals Review          Personal Goals Review  Weight Management/Obesity;Heart Failure;Hypertension;Lipids;Stress    Review  pt with multiple CAD RF demonstrates eagerness to participate in CR program. pt personal goals are to increase strength/stamina and decrease fatigue.  pt ultimately would like to run 1/2marat71m.     Expected Outcomes  pt will participate in CR exercise, nutrition and lifestyle modification to decrease overall CAD RF.            ITP Comments: ITP Comments    Row Name 05/11/18 0829 05/19/18 0933 06/01/18 1339   ITP Comments  Medical Director- Dr. Traci TFransico Himt started group exercise. At pt request, pt class time changed to 8:15.  pt tolerated light activity without difficulty. pt oriented to exercise and equipment safety guidelines.  30 day ITP review.  pt with good attendance and participation.        Comments:

## 2018-06-04 ENCOUNTER — Telehealth: Payer: Self-pay | Admitting: Physician Assistant

## 2018-06-04 ENCOUNTER — Ambulatory Visit (HOSPITAL_COMMUNITY): Payer: BLUE CROSS/BLUE SHIELD

## 2018-06-04 ENCOUNTER — Encounter (HOSPITAL_COMMUNITY)
Admission: RE | Admit: 2018-06-04 | Discharge: 2018-06-04 | Disposition: A | Discharge status: Home / Self Care | Payer: BLUE CROSS/BLUE SHIELD | Source: Ambulatory Visit | Attending: Interventional Cardiology | Admitting: Interventional Cardiology

## 2018-06-04 ENCOUNTER — Telehealth: Payer: Self-pay | Admitting: Internal Medicine

## 2018-06-04 DIAGNOSIS — I213 ST elevation (STEMI) myocardial infarction of unspecified site: Secondary | ICD-10-CM

## 2018-06-04 DIAGNOSIS — Z955 Presence of coronary angioplasty implant and graft: Secondary | ICD-10-CM | POA: Diagnosis not present

## 2018-06-04 NOTE — Progress Notes (Signed)
Daily Session Note  Patient Details  Name: Jack [name] MRN: [id number] Date of Birth: 12/05/1965 Referring Provider:     [place] from 05/11/2018 in [place]  Referring Provider  Dr. Tamala Julian      Encounter Date: 06/04/2018  Check In: Session Check In - 06/04/18 0846      Check-In   Supervising physician immediately available to respond to emergencies  [facility] immediately available    Physician(s)  Dr. Allyson Sabal     Location  [facility]    Staff Present  Jack Register, MS, Exercise Physiologist;Jack Artiga Karle Starch, RN, BSN;Jack [name], BS, ACSM CEP, Exercise Physiologist;Jack DiVincenzo, MS, ACSM RCEP, Exercise Physiologist    Medication changes reported      No    Fall or balance concerns reported     No    Tobacco Cessation  No Change    Warm-up and Cool-down  Performed as group-led instruction    Resistance Training Performed  Yes    VAD Patient?  No    PAD/SET Patient?  No      Pain Assessment   Currently in Pain?  No/denies    Pain Score  0-No pain    Multiple Pain Sites  No       Capillary Blood Glucose: No results found for this or any previous visit (from the past 24 hour(s)).    Social History   Tobacco Use  Smoking Status Former Smoker  . Types: Cigars  . Last attempt to quit: 02/20/2018  . Years since quitting: 0.2  Smokeless Tobacco Never Used    Goals Met:  Independence with exercise equipment  Goals Unmet:  BP  Comments: Jack [name] asked staff for Gatorade after exercise today.  Upon assessment of pt, skin pallor.  Pt unable to describe how he was feeling at the time.  Pt with continued diarrhea r/t Chron's disease.  He had 2 episodes this morning.  BP 79/41 via automatic cuff, SR HR 68.  Pt assisted via W/C to treatment room to lie patient down and elevate legs.  Gatorade given x1. Repeat BP 90/51.  Jack [name] able to describe feeling "lightheaded earlier."  Repeat BP after 5  minutes 89/51.  Gatorade given again.  Repeat BP after 5 minutes 104/60 HR 88 lying down.  Pt's skin color no longer pale.  Sitting BP 94/55 HR 76. Standing 69/47.  Pt c/o feeling lightheaded again "but not as bad." Water given.  Repeat BP after 5 minutes 131/66 sitting. 96/61 with standing.  Several laps completed around track with no complaints of dizziness.  Final BP 105/63.  Jack Bhagat, PA notified and orders received for patient to hold losartan.  Pt needs to check BP in the AM and PM before he takes his BP medications and keep a record.  If pt's SBP is less than 110, hold BP medicationS and to call Dr. Thompson Caul office on Monday with report of BP.   RN informed pt to NOT exercise when he has diarrhea.  Instructions written down and discussed with patient and wife who was called on the phone.  Both verbalized understanding.  Handout given to patient.    Dr. Fransico Him is Medical Director for Cardiac Rehab at [facility].

## 2018-06-04 NOTE — Telephone Encounter (Signed)
Left message for pt to call back  °

## 2018-06-04 NOTE — Telephone Encounter (Signed)
Unfortunately, I wont be able to help with this one as I dont schedule entvyio etc. Rosanne Sack, RN is knowledgeable about Entvyio so I will ask for her help here.Marland KitchenMarland Kitchen

## 2018-06-04 NOTE — Telephone Encounter (Signed)
Paged from cardiac rehab with BP of 99/52. Did not took his home medication yet. BP usually runs in 100s/60s at home. Few diarrhea  This morning. He got 2 gaterods in cardiac rehab.   Advised to hold Losartan today. Take coreg and spironolactone if SBP > 110s. Monitor BP over weekend prior multiple times a day.   He will call us back Monday with reading for review.

## 2018-06-04 NOTE — Telephone Encounter (Signed)
Wife states that the pt got his entyvio on Tuesday. States he has been taking Lomotil for a week now. Two days this week the wife got calls from cardiac rehab regarding the pts BP being 79/41. They have been told it is probably related to dehydartion and the multiple BM's he has been having have caused this. Pt states when he was treated in [place] he was getting the entyvio every 7 weeks. They are calling wanting Dr. [name] to approve the entyvio every 7 weeks. Please advise.

## 2018-06-07 ENCOUNTER — Encounter (HOSPITAL_COMMUNITY): Payer: BLUE CROSS/BLUE SHIELD

## 2018-06-07 ENCOUNTER — Ambulatory Visit (HOSPITAL_COMMUNITY): Payer: BLUE CROSS/BLUE SHIELD

## 2018-06-07 ENCOUNTER — Other Ambulatory Visit: Payer: Self-pay

## 2018-06-07 NOTE — Telephone Encounter (Signed)
Yes, ok to increase entyvio to every 7 weeks Would recommend we check entyvio level and antibody blood test 1-2 days before next infusion

## 2018-06-07 NOTE — Telephone Encounter (Signed)
Spoke with pts wife and she is aware. [facility] to change entyvio order to every 7 weeks. Left message. Will call back if I do not hear back from them. [phone].

## 2018-06-08 ENCOUNTER — Other Ambulatory Visit: Payer: Self-pay

## 2018-06-08 DIAGNOSIS — K5090 Crohn's disease, unspecified, without complications: Secondary | ICD-10-CM

## 2018-06-08 NOTE — Telephone Encounter (Signed)
Order faxed to [facility] to increase entyvio to every 7 weeks at [phone]. Per [facility] they will contact pt with appt for 07/20/18. Pt to come for labs here 07/19/18, he knows to ask for Dr. Vena Rua nurse to get kit for labs.

## 2018-06-09 ENCOUNTER — Ambulatory Visit (HOSPITAL_COMMUNITY): Payer: BLUE CROSS/BLUE SHIELD

## 2018-06-09 ENCOUNTER — Encounter (HOSPITAL_COMMUNITY): Payer: BLUE CROSS/BLUE SHIELD

## 2018-06-10 ENCOUNTER — Other Ambulatory Visit: Payer: Self-pay

## 2018-06-10 ENCOUNTER — Ambulatory Visit (INDEPENDENT_AMBULATORY_CARE_PROVIDER_SITE_OTHER): Payer: BLUE CROSS/BLUE SHIELD | Admitting: *Deleted

## 2018-06-10 DIAGNOSIS — Z5181 Encounter for therapeutic drug level monitoring: Secondary | ICD-10-CM | POA: Diagnosis not present

## 2018-06-10 DIAGNOSIS — I236 Thrombosis of atrium, auricular appendage, and ventricle as current complications following acute myocardial infarction: Secondary | ICD-10-CM

## 2018-06-10 DIAGNOSIS — Z7901 Long term (current) use of anticoagulants: Secondary | ICD-10-CM | POA: Diagnosis not present

## 2018-06-10 LAB — POCT INR: INR: 2.3 (ref 2.0–3.0)

## 2018-06-10 NOTE — Patient Instructions (Signed)
Description   Continue taking 1 tablet daily except 1/2 tablet on Tuesdays and Saturdays.  Recheck INR in 2 weeks. [facility] [phone] Main [phone]

## 2018-06-11 ENCOUNTER — Encounter (HOSPITAL_COMMUNITY): Payer: BLUE CROSS/BLUE SHIELD

## 2018-06-11 ENCOUNTER — Ambulatory Visit (HOSPITAL_COMMUNITY): Payer: BLUE CROSS/BLUE SHIELD

## 2018-06-14 ENCOUNTER — Ambulatory Visit (HOSPITAL_COMMUNITY): Payer: BLUE CROSS/BLUE SHIELD

## 2018-06-14 ENCOUNTER — Telehealth (HOSPITAL_COMMUNITY): Payer: Self-pay | Admitting: Cardiac Rehabilitation

## 2018-06-14 ENCOUNTER — Encounter (HOSPITAL_COMMUNITY): Payer: BLUE CROSS/BLUE SHIELD

## 2018-06-14 NOTE — Telephone Encounter (Signed)
Phone call to pt to advise of CR Phase II departmental closing for 2 weeks.  Pt verbalized understanding.  Andi Hence, RN, BSN Cardiac Pulmonary Rehab

## 2018-06-15 ENCOUNTER — Telehealth (HOSPITAL_COMMUNITY): Payer: Self-pay

## 2018-06-15 NOTE — Telephone Encounter (Signed)
Phone call made to Pt to discuss home exercise recommendations due to Cardiac rehab being closed. Pt answered but did not say anything. Tried to call a second time and the exact same thing occurred. Will try calling Pt at another time to discuss home exercise plans.

## 2018-06-16 ENCOUNTER — Ambulatory Visit (HOSPITAL_COMMUNITY): Payer: BLUE CROSS/BLUE SHIELD

## 2018-06-16 ENCOUNTER — Encounter (HOSPITAL_COMMUNITY): Payer: BLUE CROSS/BLUE SHIELD

## 2018-06-18 ENCOUNTER — Encounter (HOSPITAL_COMMUNITY): Payer: BLUE CROSS/BLUE SHIELD

## 2018-06-18 ENCOUNTER — Ambulatory Visit (HOSPITAL_COMMUNITY): Payer: BLUE CROSS/BLUE SHIELD

## 2018-06-19 ENCOUNTER — Other Ambulatory Visit: Payer: Self-pay | Admitting: Interventional Cardiology

## 2018-06-19 ENCOUNTER — Other Ambulatory Visit: Payer: Self-pay | Admitting: Family Medicine

## 2018-06-20 NOTE — Telephone Encounter (Signed)
Will need to verify with patient - is he using any SL nitro? If so don't recommend cialis refill.

## 2018-06-21 ENCOUNTER — Ambulatory Visit (HOSPITAL_COMMUNITY): Payer: BLUE CROSS/BLUE SHIELD

## 2018-06-21 ENCOUNTER — Other Ambulatory Visit: Payer: Self-pay

## 2018-06-21 ENCOUNTER — Encounter (HOSPITAL_COMMUNITY): Payer: BLUE CROSS/BLUE SHIELD

## 2018-06-21 MED ORDER — ATORVASTATIN CALCIUM 80 MG PO TABS: 80.0000 mg | ORAL_TABLET | Freq: Every day | ORAL | 3 refills | Status: DC

## 2018-06-21 NOTE — Telephone Encounter (Signed)
Spoke with patient. He does not take SL nitro. Never had to take this. He was given this medication just in case but never had to actually use it. Please review

## 2018-06-21 NOTE — Telephone Encounter (Signed)
refilled 

## 2018-06-22 ENCOUNTER — Telehealth: Payer: Self-pay

## 2018-06-22 ENCOUNTER — Encounter (HOSPITAL_COMMUNITY): Payer: Self-pay | Admitting: Cardiac Rehabilitation

## 2018-06-22 DIAGNOSIS — Z955 Presence of coronary angioplasty implant and graft: Secondary | ICD-10-CM

## 2018-06-22 DIAGNOSIS — I213 ST elevation (STEMI) myocardial infarction of unspecified site: Secondary | ICD-10-CM

## 2018-06-22 NOTE — Progress Notes (Signed)
Cardiac Individual Treatment Plan  Patient Details  Name: Jack [name] MRN: [id number] Date of Birth: July 14, 1965 Referring Provider:   Flowsheet Row CARDIAC REHAB PHASE II ORIENTATION from 05/11/2018 in [place]  Referring Provider  Dr. Tamala Julian      Initial Encounter Date:  Flowsheet Row CARDIAC REHAB PHASE II ORIENTATION from 05/11/2018 in [place]  Date  05/11/18      Visit Diagnosis: 02/20/18 DES LAD  02/20/18 STEMI  Patient's Home Medications on Admission:  Current Outpatient Medications:  .  acetaminophen (TYLENOL) 325 MG tablet, Take 650 mg by mouth every 6 (six) hours as needed for mild pain or headache. , Disp: , Rfl:  .  alendronate (FOSAMAX) 70 MG tablet, Take 70 mg by mouth every Saturday. , Disp: , Rfl: 11 .  atorvastatin (LIPITOR) 80 MG tablet, Take 1 tablet (80 mg total) by mouth daily at 6 PM., Disp: 90 tablet, Rfl: 3 .  Calcium Carbonate-Vit D-Min (CALCIUM 1200 PO), Take 1 capsule by mouth 2 (two) times daily., Disp: , Rfl:  .  carvedilol (COREG) 6.25 MG tablet, Take 1 tablet (6.25 mg total) by mouth 2 (two) times daily., Disp: 180 tablet, Rfl: 3 .  clopidogrel (PLAVIX) 75 MG tablet, Take 1 tablet (75 mg total) by mouth daily., Disp: 30 tablet, Rfl: 12 .  diphenoxylate-atropine (LOMOTIL) 2.5-0.025 MG tablet, Take 1-2 tablets by mouth 4 times daily as needed for diarrhea, Disp: 90 tablet, Rfl: 1 .  furosemide (LASIX) 20 MG tablet, Take only as needed, Disp: 90 tablet, Rfl: 3 .  gabapentin (NEURONTIN) 300 MG capsule, Take one capsule TID, Disp: 270 capsule, Rfl: 1 .  HYDROcodone-acetaminophen (NORCO/VICODIN) 5-325 MG tablet, Take 1 tablet by mouth every 6 (six) hours as needed. for pain, Disp: , Rfl:  .  hydrOXYzine (ATARAX/VISTARIL) 25 MG tablet, TAKE 4 TABLETS (100 MG TOTAL) BY MOUTH AT BEDTIME., Disp: 360 tablet, Rfl: 2 .  losartan (COZAAR) 25 MG tablet, Take 25 mg by mouth daily., Disp: , Rfl:  .   Multiple Vitamin (MULTIVITAMIN) capsule, Take 1 capsule by mouth daily., Disp: , Rfl:  .  nitroGLYCERIN (NITROSTAT) 0.4 MG SL tablet, Place 1 tablet (0.4 mg total) under the tongue every 5 (five) minutes as needed for chest pain., Disp: 25 tablet, Rfl: 12 .  pantoprazole (PROTONIX) 40 MG tablet, Take 1 tablet (40 mg total) by mouth 2 (two) times daily., Disp: 60 tablet, Rfl: 6 .  predniSONE (DELTASONE) 1 MG tablet, Take 1 mg by mouth See admin instructions. Take 1 mg by mouth along with 49m and 5 mg tablet to equal 160mtotal., Disp: , Rfl:  .  predniSONE (DELTASONE) 10 MG tablet, Take 10 mg by mouth See admin instructions. Take 10 by mouth along with 49m109mnd 5 mg tablet to equal 74m43mtal., Disp: , Rfl:  .  predniSONE (DELTASONE) 5 MG tablet, Take 5 mg by mouth See admin instructions. Take 5 mg by mouth along with 49mg 54m 10mg 549met to equal 74mg t67m., Disp: , Rfl: 2 .  spironolactone (ALDACTONE) 25 MG tablet, Take 0.5 tablets (12.5 mg total) by mouth daily., Disp: 90 tablet, Rfl: 3 .  tadalafil (CIALIS) 5 MG tablet, TAKE ONE TABLET BY MOUTH DAILY AS NEEDED FOR ERECTILE DYSFUNCTION, Disp: 20 tablet, Rfl: 5 .  tamsulosin (FLOMAX) 0.4 MG CAPS capsule, TAKE 1 CAPSULE (0.4 MG TOTAL) BY MOUTH DAILY., Disp: 90 capsule, Rfl: 3 .  vedolizumab (ENTYVIO) 300 MG  injection, Inject 300 mg into the vein every 8 (eight) weeks. , Disp: , Rfl:  .  warfarin (COUMADIN) 5 MG tablet, TAKE AS DIRECTED BY COUMADIN CLINIC, Disp: 90 tablet, Rfl: 0 No current facility-administered medications for this visit.   Facility-Administered Medications Ordered in Other Visits:  .  ondansetron (ZOFRAN) 4 mg in sodium chloride 0.9 % 50 mL IVPB, 4 mg, Intravenous, Q6H PRN, Kristeen Miss, MD  Past Medical History: Past Medical History:  Diagnosis Date  . BPH (benign prostatic hypertrophy)   . CAD (coronary artery disease)    a. anterior STEMI 01/2018 -  proximal occlusion of LAD, treated with DES. Cath also showed 20% distal  LM, 95% ostial-prox small-moderate ramus, 70-80% ostial Cx, 70% dominant ostial OM, 50-60% prox Cx, 70% RCA. EF 35% by cath with LVEDP 69mHg. Med rx for residual disease. Course complicated by post MI pericarditis and LV thrombus.  . Chronic systolic (congestive) heart failure ([facility]   . Colitis   . Colon polyp    inflammatory  . Crohn disease ([facility] 1992   history uveitis, involvement of intestines and lungs  . Diverticulosis   . Eosinophilic granuloma ([facility] [facility]   . Essential hypertension   . GERD (gastroesophageal reflux disease)   . GI bleeding   . History of chicken pox   . History of gastroesophageal reflux (GERD)   . History of seizure 1995   grand mal x1, completed 6 yrs dilantin. no seizures since  . Hyperlipidemia   . Internal hemorrhoids   . Ischemic cardiomyopathy   . LV (left ventricular) mural thrombus following MI ([facility] 01/2018  . Osteoporosis 11/2015   DEXA T -2.9  . Schwannoma 2007   L axilla s/p surgery  . Seizures ([facility]    last seizure 1995 and only x 1 seizure-   . Ulnar neuropathy    h/o this from L arm schwannoma    Tobacco Use: Social History   Tobacco Use  Smoking Status Former Smoker  . Types: Cigars  . Last attempt to quit: 02/20/2018  . Years since quitting: 0.3  Smokeless Tobacco Never Used    Labs: Recent Review Flowsheet Data    Labs for ITP Cardiac and Pulmonary Rehab Latest Ref Rng & Units 02/20/2018 02/20/2018 02/20/2018 04/20/2018 04/28/2018   Cholestrol 100 - 199 mg/dL - - 224(H) 165 144   LDLCALC 0 - 99 mg/dL - - 166(H) 92 75   HDL >39 mg/dL - - 50 38(L) 38(L)   Trlycerides 0 - 149 mg/dL - - 40 175(H) 154(H)   Hemoglobin A1c 4.8 - 5.6 % - 5.2 - - -   TCO2 22 - 32 mmol/L 24 - - - -      Capillary Blood Glucose: Lab Results  Component Value Date   GLUCAP 86 05/31/2018     Exercise Target Goals: Exercise Program Goal: Individual exercise prescription set using results from initial 6 min walk test and THRR while considering   patient's activity barriers and safety.   Exercise Prescription Goal: Initial exercise prescription builds to 30-45 minutes a day of aerobic activity, 2-3 days per week.  Home exercise guidelines will be given to patient during program as part of exercise prescription that the participant will acknowledge.  Activity Barriers & Risk Stratification: Activity Barriers & Cardiac Risk Stratification - 05/11/18 0905    Activity Barriers & Cardiac Risk Stratification          Activity Barriers  None    Cardiac Risk Stratification  High  6 Minute Walk: 6 Minute Walk    6 Minute Walk    Row Name 05/11/18 1017   Phase  Initial   Distance  1800 feet   Walk Time  6 minutes   # of Rest Breaks  0   MPH  3.4   METS  4.7   RPE  12   VO2 Peak  16.61   Symptoms  No   Resting HR  70 bpm   Resting BP  102/60   Resting Oxygen Saturation   100 %   Exercise Oxygen Saturation  during 6 min walk  100 %   Max Ex. HR  93 bpm   Max Ex. BP  122/68   2 Minute Post BP  114/70          Oxygen Initial Assessment:   Oxygen Re-Evaluation:   Oxygen Discharge (Final Oxygen Re-Evaluation):   Initial Exercise Prescription: Initial Exercise Prescription - 05/11/18 1000    Date of Initial Exercise RX and Referring Provider          Date  05/11/18    Referring Provider  Dr. Tamala Julian    Expected Discharge Date  08/16/18        Treadmill          MPH  3.8    Grade  2    Minutes  10        Bike          Level  1.5    Minutes  10    METs  4.54        Elliptical          Level  2    Speed  2    Minutes  10    METs  4.5        Prescription Details          Frequency (times per week)  3    Duration  Progress to 30 minutes of continuous aerobic without signs/symptoms of physical distress        Intensity          THRR 40-80% of Max Heartrate  68-136    Ratings of Perceived Exertion  11-13        Progression          Progression  Continue to progress workloads to  maintain intensity without signs/symptoms of physical distress.        Resistance Training          Training Prescription  Yes    Weight  4 lbs.     Reps  10-15           Perform Capillary Blood Glucose checks as needed.  Exercise Prescription Changes: Exercise Prescription Changes    Response to Exercise    Row Name 05/19/18 1000 06/04/18 0924   Blood Pressure (Admit)  98/76  110/70   Blood Pressure (Exercise)  160/80  128/72   Blood Pressure (Exit)  114/62  105/63   Heart Rate (Admit)  83 bpm  69 bpm   Heart Rate (Exercise)  127 bpm  125 bpm   Heart Rate (Exit)  84 bpm  68 bpm   Rating of Perceived Exertion (Exercise)  13  14   Symptoms  None  Light headed and dizzy.   Comments  Pt first day of exercise.  no documentation   Duration  Continue with 30 min of aerobic exercise without signs/symptoms of physical distress.  Continue with 30 min of  aerobic exercise without signs/symptoms of physical distress.   Intensity  THRR unchanged  THRR unchanged       Progression    Row Name 05/19/18 1000 06/04/18 0924   Progression  Continue to progress workloads to maintain intensity without signs/symptoms of physical distress.  Continue to progress workloads to maintain intensity without signs/symptoms of physical distress.   Average METs  4.1  5.2       Resistance Training    Row Name 05/19/18 1000 06/04/18 0924   Training Prescription  No  No       Treadmill    Row Name 05/19/18 1000 06/04/18 0924   MPH  3.8  3.8   Grade  2  2   Minutes  10  [place] Name 05/19/18 1000 06/04/18 0924   Level  1.5  2   Minutes  10  10   METs  4.58  4.68       NuStep    Row Name 05/19/18 1000 06/04/18 0924   Level  4  no documentation   SPM  85  no documentation   Minutes  10  no documentation   METs  2.9  no documentation       Elliptical    Row Name 05/19/18 1000 06/04/18 0924   Level  no documentation  no documentation   Speed  no documentation  no documentation    Minutes  no documentation  no documentation   METs  no documentation  no documentation       Rower    Row Name 05/19/18 1000 06/04/18 0924   Level  no documentation  3   Watts  no documentation  57   Minutes  no documentation  10   METs  no documentation  6          Exercise Comments: Exercise Comments    Row Name 05/19/18 1006 06/01/18 1420 06/17/18 0927   Exercise Comments  Pt first day of exercise. Pt tolerated exercise well.   Reviewed METs and goals with Pt. Pt is tolerating exercise well.   Unable to review METs and goals with Pt due to closure of Cardiac Rehab for [place].      Exercise Goals and Review: Exercise Goals    Exercise Goals    Row Name 05/11/18 0905   Increase Physical Activity  Yes   Intervention  Provide advice, education, support and counseling about physical activity/exercise needs.;Develop an individualized exercise prescription for aerobic and resistive training based on initial evaluation findings, risk stratification, comorbidities and participant's personal goals.   Expected Outcomes  Short Term: Attend rehab on a regular basis to increase amount of physical activity.   Increase Strength and Stamina  Yes   Intervention  Provide advice, education, support and counseling about physical activity/exercise needs.;Develop an individualized exercise prescription for aerobic and resistive training based on initial evaluation findings, risk stratification, comorbidities and participant's personal goals.   Expected Outcomes  Short Term: Increase workloads from initial exercise prescription for resistance, speed, and METs.   Able to understand and use rate of perceived exertion (RPE) scale  Yes   Intervention  Provide education and explanation on how to use RPE scale   Expected Outcomes  Short Term: Able to use RPE daily in rehab to express subjective intensity level;Long Term:  Able to use RPE to guide intensity level when exercising independently   Knowledge  and understanding of Target  Heart Rate Range (THRR)  Yes   Intervention  Provide education and explanation of THRR including how the numbers were predicted and where they are located for reference   Expected Outcomes  Short Term: Able to state/look up THRR;Long Term: Able to use THRR to govern intensity when exercising independently;Short Term: Able to use daily as guideline for intensity in rehab   Able to check pulse independently  Yes   Intervention  Provide education and demonstration on how to check pulse in carotid and radial arteries.;Review the importance of being able to check your own pulse for safety during independent exercise   Expected Outcomes  Short Term: Able to explain why pulse checking is important during independent exercise;Long Term: Able to check pulse independently and accurately   Understanding of Exercise Prescription  Yes   Intervention  Provide education, explanation, and written materials on patient's individual exercise prescription   Expected Outcomes  Short Term: Able to explain program exercise prescription;Long Term: Able to explain home exercise prescription to exercise independently          Exercise Goals Re-Evaluation : Exercise Goals Re-Evaluation    Exercise Goal Re-Evaluation    Row Name 05/19/18 1004 06/01/18 1419 06/17/18 0926   Exercise Goals Review  Increase Physical Activity;Increase Strength and Stamina;Able to understand and use rate of perceived exertion (RPE) scale;Knowledge and understanding of Target Heart Rate Range (THRR);Understanding of Exercise Prescription  Increase Physical Activity;Increase Strength and Stamina;Able to understand and use rate of perceived exertion (RPE) scale;Knowledge and understanding of Target Heart Rate Range (THRR);Understanding of Exercise Prescription  Increase Physical Activity;Increase Strength and Stamina;Able to understand and use rate of perceived exertion (RPE) scale;Knowledge and understanding of Target  Heart Rate Range (THRR);Understanding of Exercise Prescription   Comments  Pt first day of exercise. Pt tolerated exercise well and understands RPE scale and exercise Rx. Will progress pt as tolerated.   Reviewed METs and goals with Pt. Pt MET level is 4.5. Pt is tolerating exercise well and has a goal to join [employer]. Pt is not currently exercising at home but gets physical activity at work.   Unable to review METs and goals with Pt due to closure of Cardiac Rehab for [place]. Pt MET level is 5.2 and is progressing well. Pt is tolerating exercise Rx well.    Expected Outcomes  Will continue to monitor and progress Pt as tolerated.   Will continue to monitor and progress Pt as tolerated.   Will continue to monitor and progress Pt as tolerated.           Discharge Exercise Prescription (Final Exercise Prescription Changes): Exercise Prescription Changes - 06/04/18 0924    Response to Exercise          Blood Pressure (Admit)  110/70    Blood Pressure (Exercise)  128/72    Blood Pressure (Exit)  105/63    Heart Rate (Admit)  69 bpm    Heart Rate (Exercise)  125 bpm    Heart Rate (Exit)  68 bpm    Rating of Perceived Exertion (Exercise)  14    Symptoms  Light headed and dizzy.    Duration  Continue with 30 min of aerobic exercise without signs/symptoms of physical distress.    Intensity  THRR unchanged        Progression          Progression  Continue to progress workloads to maintain intensity without signs/symptoms of physical distress.    Average METs  5.2        Resistance Training          Training Prescription  No        Treadmill          MPH  3.8    Grade  2    Minutes  10        Bike          Level  2    Minutes  10    METs  4.68        Rower          Level  3    Watts  57    Minutes  10    METs  6           Nutrition:  Target Goals: Understanding of nutrition guidelines, daily intake of sodium <151m, cholesterol <2071m calories 30% from fat  and 7% or less from saturated fats, daily to have 5 or more servings of fruits and vegetables.  Biometrics: Pre Biometrics - 05/11/18 1018    Pre Biometrics          Height  5' 7"  (1.702 m)    Weight  174 lb 2.6 oz (79 kg)    Waist Circumference  37.5 inches    Hip Circumference  42 inches    Waist to Hip Ratio  0.89 %    BMI (Calculated)  27.27    Triceps Skinfold  25 mm    % Body Fat  27.8 %    Grip Strength  43 kg    Flexibility  0 in    Single Leg Stand  30 seconds            Nutrition Therapy Plan and Nutrition Goals: Nutrition Therapy & Goals - 05/12/18 0959    Nutrition Therapy          Diet  heart healthy        Personal Nutrition Goals          Nutrition Goal  Pt to identify food quantities necessary to achieve weight loss of 6-24 lb at graduation from cardiac rehab.     Personal Goal #2  Pt to build a healthy plate including vegetables, fruits, whole grains, and low-fat dairy products in a heart healthy meal plan.    Personal Goal #3  Pt to weigh and measure serving sizes    Personal Goal #4  Pt to eat a variety of non-starchy vegetables        Intervention Plan          Intervention  Prescribe, educate and counsel regarding individualized specific dietary modifications aiming towards targeted core components such as weight, hypertension, lipid management, diabetes, heart failure and other comorbidities.    Expected Outcomes  Short Term Goal: Understand basic principles of dietary content, such as calories, fat, sodium, cholesterol and nutrients.;Long Term Goal: Adherence to prescribed nutrition plan.           Nutrition Assessments: Nutrition Assessments - 05/12/18 1000    MEDFICTS Scores          Pre Score  21           Nutrition Goals Re-Evaluation:   Nutrition Goals Re-Evaluation:   Nutrition Goals Discharge (Final Nutrition Goals Re-Evaluation):   Psychosocial: Target Goals: Acknowledge presence or absence of significant depression  and/or stress, maximize coping skills, provide positive support system. Participant is able to verbalize types and ability to use techniques and skills needed for  reducing stress and depression.  Initial Review & Psychosocial Screening: Initial Psych Review & Screening - 05/11/18 1133    Initial Review          Current issues with  Current Stress Concerns    Source of Stress Concerns  Chronic Illness;Occupation        Family Dynamics          [place]?  Yes   [name] has his wife for support       Barriers          Psychosocial barriers to participate in program  The patient should benefit from training in stress management and relaxation.;Psychosocial barriers identified (see note)        Screening Interventions          Interventions  Encouraged to exercise    Expected Outcomes  Long Term Goal: Stressors or current issues are controlled or eliminated.           Quality of Life Scores: Quality of Life - 05/11/18 1019    Quality of Life          Select  Quality of Life        Quality of Life Scores          Health/Function Pre  22.47 %    Socioeconomic Pre  27.69 %    Psych/Spiritual Pre  26.36 %    Family Pre  26.4 %    GLOBAL Pre  25 %          Scores of 19 and below usually indicate a poorer quality of life in these areas.  A difference of  2-3 points is a clinically meaningful difference.  A difference of 2-3 points in the total score of the Quality of Life Index has been associated with significant improvement in overall quality of life, self-image, physical symptoms, and general health in studies assessing change in quality of life.  PHQ-9: Recent Review Flowsheet Data    Depression screen [facility] 2/9 05/19/2018 03/20/2017   Decreased Interest 0 0   Down, Depressed, Hopeless 0 0   PHQ - 2 Score 0 0     Interpretation of Total Score  Total Score Depression Severity:  1-4 = Minimal depression, 5-9 = Mild depression, 10-14 = Moderate depression, 15-19  = Moderately severe depression, 20-27 = Severe depression   Psychosocial Evaluation and Intervention: Psychosocial Evaluation - 05/19/18 0936    Psychosocial Evaluation & Interventions          Interventions  Stress management education;Encouraged to exercise with the program and follow exercise prescription    Comments  pt with stress concerns from occupation and chronic illness.  otherwise no psychosocial concerns, no other interventions necessary. 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     Expected Outcomes  pt will exhibit positive outlook with improved coping skills.     Continue Psychosocial Services   No Follow up required           Psychosocial Re-Evaluation: Psychosocial Re-Evaluation    Psychosocial Re-Evaluation    [place] Name 06/22/18 1545   Current issues with  Current Stress Concerns;Current Anxiety/Panic   Comments  pt with health related anxiety, job and situational stress.  pt with chronic illness unable to perform previous activities.    Expected Outcomes  pt will demonstrate improved outlook with good coping skills.   Interventions  Stress management education;Relaxation education;Encouraged to attend Cardiac Rehabilitation for the exercise  Continue Psychosocial Services   Follow up required by staff       Initial Review    Row Name 06/22/18 1545   Source of Stress Concerns  Chronic Illness;Poor Coping Skills;Occupation;Unable to participate in former interests or hobbies          Psychosocial Discharge (Final Psychosocial Re-Evaluation): Psychosocial Re-Evaluation - 06/22/18 1545    Psychosocial Re-Evaluation          Current issues with  Current Stress Concerns;Current Anxiety/Panic    Comments  pt with health related anxiety, job and situational stress.  pt with chronic illness unable to perform previous activities.     Expected Outcomes  pt will demonstrate improved outlook with good coping skills.     Interventions  Stress management education;Relaxation education;Encouraged to attend Cardiac Rehabilitation for the exercise    Continue Psychosocial Services   Follow up required by staff        Initial Review          Source of Stress Concerns  Chronic Illness;Poor Coping Skills;Occupation;Unable to participate in former interests or hobbies           Vocational Rehabilitation: Provide vocational rehab assistance to qualifying candidates.   Vocational Rehab Evaluation & Intervention: Vocational Rehab - 05/11/18 1146    Initial Vocational Rehab Evaluation & Intervention          Assessment shows need for Vocational Rehabilitation  No   [name] does not need vocational rehab at this time as he is self employed          Education: Education Goals: Education classes will be provided on a weekly basis, covering required topics. Participant will state understanding/return demonstration of topics presented.  Learning Barriers/Preferences: Learning Barriers/Preferences - 05/11/18 0177    Learning Barriers/Preferences          Learning Barriers  Sight    Learning Preferences  Written Material;Video;Pictoral;Skilled Demonstration           Education Topics: Count Your Pulse:  -Group instruction provided by verbal instruction, demonstration, patient participation and written materials to support subject.  Instructors address importance of being able to find your pulse and how to count your pulse when at home without a heart monitor.  Patients get hands on experience counting their pulse with staff help and individually.   Heart Attack, Angina, and Risk Factor Modification:  -Group instruction provided by verbal instruction, video, and written materials to support subject.  Instructors address signs and symptoms of angina and heart attacks.    Also discuss risk factors for heart disease and how to make changes to improve heart health risk factors.   Functional Fitness:  -Group  instruction provided by verbal instruction, demonstration, patient participation, and written materials to support subject.  Instructors address safety measures for doing things around the house.  Discuss how to get up and down off the floor, how to pick things up properly, how to safely get out of a chair without assistance, and balance training.   Meditation and Mindfulness:  -Group instruction provided by verbal instruction, patient participation, and written materials to support subject.  Instructor addresses importance of mindfulness and meditation practice to help reduce stress and improve awareness.  Instructor also leads participants through a meditation exercise.    Stretching for Flexibility and Mobility:  -Group instruction provided by verbal instruction, patient participation, and written materials to support subject.  Instructors lead participants through series of stretches that are designed to increase flexibility thus improving mobility.  These stretches are additional exercise for major muscle groups that are typically performed during regular warm up and cool down.   Hands Only CPR:  -Group verbal, video, and participation provides a basic overview of AHA guidelines for community CPR. Role-play of emergencies allow participants the opportunity to practice calling for help and chest compression technique with discussion of AED use.   Hypertension: -Group verbal and written instruction that provides a basic overview of hypertension including the most recent diagnostic guidelines, risk factor reduction with self-care instructions and medication management.    Nutrition I class: Heart Healthy Eating:  -Group instruction provided by PowerPoint slides, verbal discussion, and written materials to support subject matter. The instructor gives an explanation and review of the Therapeutic Lifestyle Changes diet recommendations, which includes a discussion on lipid goals, dietary fat,  sodium, fiber, plant stanol/sterol esters, sugar, and the components of a well-balanced, healthy diet.   Nutrition II class: Lifestyle Skills:  -Group instruction provided by PowerPoint slides, verbal discussion, and written materials to support subject matter. The instructor gives an explanation and review of label reading, grocery shopping for heart health, heart healthy recipe modifications, and ways to make healthier choices when eating out.   Diabetes Question & Answer:  -Group instruction provided by PowerPoint slides, verbal discussion, and written materials to support subject matter. The instructor gives an explanation and review of diabetes co-morbidities, pre- and post-prandial blood glucose goals, pre-exercise blood glucose goals, signs, symptoms, and treatment of hypoglycemia and hyperglycemia, and foot care basics.   Diabetes Blitz:  -Group instruction provided by PowerPoint slides, verbal discussion, and written materials to support subject matter. The instructor gives an explanation and review of the physiology behind type 1 and type 2 diabetes, diabetes medications and rational behind using different medications, pre- and post-prandial blood glucose recommendations and Hemoglobin A1c goals, diabetes diet, and exercise including blood glucose guidelines for exercising safely.    Portion Distortion:  -Group instruction provided by PowerPoint slides, verbal discussion, written materials, and food models to support subject matter. The instructor gives an explanation of serving size versus portion size, changes in portions sizes over the last 20 years, and what consists of a serving from each food group.   Stress Management:  -Group instruction provided by verbal instruction, video, and written materials to support subject matter.  Instructors review role of stress in heart disease and how to cope with stress positively.     Exercising on Your Own:  -Group instruction provided by  verbal instruction, power point, and written materials to support subject.  Instructors discuss benefits of exercise, components of exercise, frequency and intensity of exercise, and end points for exercise.  Also discuss use of nitroglycerin and activating EMS.  Review options of places to exercise outside of rehab.  Review guidelines for sex with heart disease.   Cardiac Drugs I:  -Group instruction provided by verbal instruction and written materials to support subject.  Instructor reviews cardiac drug classes: antiplatelets, anticoagulants, beta blockers, and statins.  Instructor discusses reasons, side effects, and lifestyle considerations for each drug class.   Cardiac Drugs II:  -Group instruction provided by verbal instruction and written materials to support subject.  Instructor reviews cardiac drug classes: angiotensin converting enzyme inhibitors (ACE-I), angiotensin II receptor blockers (ARBs), nitrates, and calcium channel blockers.  Instructor discusses reasons, side effects, and lifestyle considerations for each drug class.   Anatomy and Physiology of the Circulatory System:  Group verbal and written instruction and models provide basic cardiac anatomy  and physiology, with the coronary electrical and arterial systems. Review of: AMI, Angina, Valve disease, Heart Failure, Peripheral Artery Disease, Cardiac Arrhythmia, Pacemakers, and the ICD.   Other Education:  -Group or individual verbal, written, or video instructions that support the educational goals of the cardiac rehab program.   Holiday Eating Survival Tips:  -Group instruction provided by PowerPoint slides, verbal discussion, and written materials to support subject matter. The instructor gives patients tips, tricks, and techniques to help them not only survive but enjoy the holidays despite the onslaught of food that accompanies the holidays.   Knowledge Questionnaire Score: Knowledge Questionnaire Score - 05/11/18  0902    Knowledge Questionnaire Score          Pre Score  23/24           Core Components/Risk Factors/Patient Goals at Admission: Personal Goals and Risk Factors at Admission - 05/11/18 1205    Core Components/Risk Factors/Patient Goals on Admission           Weight Management  Yes;Weight Maintenance    Intervention  Weight Management: Develop a combined nutrition and exercise program designed to reach desired caloric intake, while maintaining appropriate intake of nutrient and fiber, sodium and fats, and appropriate energy expenditure required for the weight goal.;Weight Management: Provide education and appropriate resources to help participant work on and attain dietary goals.    Admit Weight  174 lb 2.6 oz (79 kg)    Expected Outcomes  Short Term: Continue to assess and modify interventions until short term weight is achieved;Long Term: Adherence to nutrition and physical activity/exercise program aimed toward attainment of established weight goal;Weight Maintenance: Understanding of the daily nutrition guidelines, which includes 25-35% calories from fat, 7% or less cal from saturated fats, less than 233m cholesterol, less than 1.5gm of sodium, & 5 or more servings of fruits and vegetables daily;Understanding recommendations for meals to include 15-35% energy as protein, 25-35% energy from fat, 35-60% energy from carbohydrates, less than 2026mof dietary cholesterol, 20-35 gm of total fiber daily;Understanding of distribution of calorie intake throughout the day with the consumption of 4-5 meals/snacks    Heart Failure  Yes    Intervention  Provide a combined exercise and nutrition program that is supplemented with education, support and counseling about heart failure. Directed toward relieving symptoms such as shortness of breath, decreased exercise tolerance, and extremity edema.    Expected Outcomes  Improve functional capacity of life;Short term: Attendance in program 2-3 days a week  with increased exercise capacity. Reported lower sodium intake. Reported increased fruit and vegetable intake. Reports medication compliance.;Short term: Daily weights obtained and reported for increase. Utilizing diuretic protocols set by physician.;Long term: Adoption of self-care skills and reduction of barriers for early signs and symptoms recognition and intervention leading to self-care maintenance.    Hypertension  Yes    Intervention  Provide education on lifestyle modifcations including regular physical activity/exercise, weight management, moderate sodium restriction and increased consumption of fresh fruit, vegetables, and low fat dairy, alcohol moderation, and smoking cessation.;Monitor prescription use compliance.    Expected Outcomes  Short Term: Continued assessment and intervention until BP is < 140/9055mG in hypertensive participants. < 130/22m64m in hypertensive participants with diabetes, heart failure or chronic kidney disease.;Long Term: Maintenance of blood pressure at goal levels.    Lipids  Yes    Intervention  Provide education and support for participant on nutrition & aerobic/resistive exercise along with prescribed medications to achieve LDL <70mg62mL >40mg.91m  Expected Outcomes  Short Term: Participant states understanding of desired cholesterol values and is compliant with medications prescribed. Participant is following exercise prescription and nutrition guidelines.;Long Term: Cholesterol controlled with medications as prescribed, with individualized exercise RX and with personalized nutrition plan. Value goals: LDL < 71m, HDL > 40 mg.    Stress  Yes    Intervention  Offer individual and/or small group education and counseling on adjustment to heart disease, stress management and health-related lifestyle change. Teach and support self-help strategies.;Refer participants experiencing significant psychosocial distress to appropriate mental health specialists for further  evaluation and treatment. When possible, include family members and significant others in education/counseling sessions.    Expected Outcomes  Short Term: Participant demonstrates changes in health-related behavior, relaxation and other stress management skills, ability to obtain effective social support, and compliance with psychotropic medications if prescribed.;Long Term: Emotional wellbeing is indicated by absence of clinically significant psychosocial distress or social isolation.           Core Components/Risk Factors/Patient Goals Review:  Goals and Risk Factor Review    Core Components/Risk Factors/Patient Goals Review    Row Name 05/19/18 0938   Personal Goals Review  Weight Management/Obesity;Heart Failure;Hypertension;Lipids;Stress   Review  pt with multiple CAD RF demonstrates eagerness to participate in CR program. pt personal goals are to increase strength/stamina and decrease fatigue.  pt ultimately would like to run 1/260mathon.    Expected Outcomes  pt will participate in CR exercise, nutrition and lifestyle modification to decrease overall CAD RF.           Core Components/Risk Factors/Patient Goals at Discharge (Final Review):  Goals and Risk Factor Review - 05/19/18 0938    Core Components/Risk Factors/Patient Goals Review          Personal Goals Review  Weight Management/Obesity;Heart Failure;Hypertension;Lipids;Stress    Review  pt with multiple CAD RF demonstrates eagerness to participate in CR program. pt personal goals are to increase strength/stamina and decrease fatigue.  pt ultimately would like to run 1/57m67mthon.     Expected Outcomes  pt will participate in CR exercise, nutrition and lifestyle modification to decrease overall CAD RF.            ITP Comments: ITP Comments    Row Name 05/11/18 0829 05/19/18 0933 06/01/18 1339 06/22/18 1544   ITP Comments  Medical Director- Dr. TraFransico HimD  pt started group exercise. At pt request, pt class time  changed to 8:15.  pt tolerated light activity without difficulty. pt oriented to exercise and equipment safety guidelines.  30 day ITP review.  pt with good attendance and participation.    30 day ITP review.  pt currently on hold for CR Dept closure for COVID 19 precautions.       Comments:  JoaAndi HenceN, BSN Cardiac Pulmonary Rehab

## 2018-06-22 NOTE — Telephone Encounter (Signed)
LMOM FOR PRSCREEN/DRIVE THRU

## 2018-06-23 ENCOUNTER — Ambulatory Visit (HOSPITAL_COMMUNITY): Payer: BLUE CROSS/BLUE SHIELD

## 2018-06-23 ENCOUNTER — Encounter (HOSPITAL_COMMUNITY): Payer: BLUE CROSS/BLUE SHIELD

## 2018-06-23 NOTE — Telephone Encounter (Signed)

## 2018-06-24 ENCOUNTER — Telehealth (HOSPITAL_COMMUNITY): Payer: Self-pay

## 2018-06-24 NOTE — Telephone Encounter (Signed)
Phone call made to Pt in regards to Cardiac Rehab closure for an additional four weeks with an expected reopen date of 07/19/18. This is due to [place]. Pt did not answer and a voicemail was left.

## 2018-06-25 ENCOUNTER — Ambulatory Visit (HOSPITAL_COMMUNITY): Payer: BLUE CROSS/BLUE SHIELD

## 2018-06-25 ENCOUNTER — Ambulatory Visit (INDEPENDENT_AMBULATORY_CARE_PROVIDER_SITE_OTHER): Payer: BLUE CROSS/BLUE SHIELD | Admitting: *Deleted

## 2018-06-25 ENCOUNTER — Encounter (HOSPITAL_COMMUNITY): Payer: BLUE CROSS/BLUE SHIELD

## 2018-06-25 DIAGNOSIS — Z5181 Encounter for therapeutic drug level monitoring: Secondary | ICD-10-CM

## 2018-06-25 DIAGNOSIS — Z7901 Long term (current) use of anticoagulants: Secondary | ICD-10-CM

## 2018-06-25 DIAGNOSIS — I236 Thrombosis of atrium, auricular appendage, and ventricle as current complications following acute myocardial infarction: Secondary | ICD-10-CM

## 2018-06-25 LAB — POCT INR: INR: 1.6 — AB (ref 2.0–3.0)

## 2018-06-28 ENCOUNTER — Ambulatory Visit (HOSPITAL_COMMUNITY): Payer: BLUE CROSS/BLUE SHIELD

## 2018-06-28 ENCOUNTER — Encounter (HOSPITAL_COMMUNITY): Payer: BLUE CROSS/BLUE SHIELD

## 2018-06-30 ENCOUNTER — Encounter (HOSPITAL_COMMUNITY): Payer: BLUE CROSS/BLUE SHIELD

## 2018-06-30 ENCOUNTER — Ambulatory Visit (HOSPITAL_COMMUNITY): Payer: BLUE CROSS/BLUE SHIELD

## 2018-07-01 ENCOUNTER — Other Ambulatory Visit: Payer: Self-pay | Admitting: Family Medicine

## 2018-07-01 NOTE — Telephone Encounter (Signed)
Plz call to verify - pt should be receiving prednisone from GI doctor, no longer from [place]?  plz also see what dose he is currently on and update chart. Thanks.

## 2018-07-01 NOTE — Telephone Encounter (Signed)
Spoke with pt relaying Dr. Synthia Innocent message.  Pt states he wasn't sure who was prescribing it so he requested it from both. Says he will call GI to request refill.  Also, he says he thinks he now taking 20 mg daily but will call back to confirm. His wife sets his meds out everyday so he does not know.

## 2018-07-01 NOTE — Telephone Encounter (Signed)
Prednisone Last filled:  12/05/17, #180 Last OV:  04/26/18, f/u Next OV:  CPE

## 2018-07-02 ENCOUNTER — Ambulatory Visit (HOSPITAL_COMMUNITY): Payer: BLUE CROSS/BLUE SHIELD

## 2018-07-02 ENCOUNTER — Encounter (HOSPITAL_COMMUNITY): Payer: BLUE CROSS/BLUE SHIELD

## 2018-07-02 ENCOUNTER — Other Ambulatory Visit: Payer: Self-pay | Admitting: Sports Medicine

## 2018-07-05 ENCOUNTER — Telehealth: Payer: Self-pay

## 2018-07-05 ENCOUNTER — Ambulatory Visit (HOSPITAL_COMMUNITY): Payer: BLUE CROSS/BLUE SHIELD

## 2018-07-05 ENCOUNTER — Encounter (HOSPITAL_COMMUNITY): Payer: BLUE CROSS/BLUE SHIELD

## 2018-07-05 NOTE — Telephone Encounter (Signed)
Noted  

## 2018-07-05 NOTE — Telephone Encounter (Signed)
   [facility] Pre-operative Risk Assessment    Request for surgical clearance:  1. What type of surgery is being performed? 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 When is this surgery scheduled? 08/09/18   3. What type of clearance is required (medical clearance vs. Pharmacy clearance to hold med vs. Both)? Pharmacy  4. Are there any medications that need to be held prior to surgery and how long?  Plavix and Coumadin  5. Practice name and name of physician performing surgery? [facility]/Dr. Kristeen Miss   6. What is your office phone number [phone]    7.   What is your office fax number [phone]  8.   Anesthesia type (None, local, MAC, general) ? General   Jack [name] 07/05/2018, 4:22 PM  _________________________________________________________________   (provider comments below)

## 2018-07-05 NOTE — Telephone Encounter (Signed)
Cleared for surgery. Discontinue Plavix 5-7 days before surgery. Resume as soon as possible after surgery. Stop coumadin permanently starting 5 days before surgery,   Please notify the patient that coumadin will not be required after back surgery. Plavix will need to be resumed

## 2018-07-06 ENCOUNTER — Other Ambulatory Visit: Payer: Self-pay | Admitting: Family Medicine

## 2018-07-06 MED ORDER — GABAPENTIN 300 MG PO CAPS: 300.0000 mg | ORAL_CAPSULE | Freq: Three times a day (TID) | ORAL | 1 refills | Status: DC

## 2018-07-06 NOTE — Telephone Encounter (Signed)
   Primary Cardiologist: Sinclair Grooms, MD  Chart reviewed as part of pre-operative protocol coverage. Patient was contacted 07/06/2018 in reference to pre-operative risk assessment for pending surgery as outlined below.  Bulmaro Feagans Stieber was last seen on 05/27/18 by Dr. Tamala Julian.  Since that day, EAN GETTEL has done well.  Per Dr. Tamala Julian: Cleared for surgery. Discontinue Plavix 5-7 days before surgery. Resume as soon as possible after surgery. Stop coumadin permanently starting 5 days before surgery,   Please notify the patient that coumadin will not be required after back surgery. Plavix will need to be resumed  Therefore, based on ACC/AHA guidelines, the patient would be at acceptable risk for the planned procedure without further cardiovascular testing.   I will route this recommendation to the requesting party via Epic fax function and remove from pre-op pool.  Please call with questions.  Tami Lin Currie Dennin, PA 07/06/2018, 8:24 AM

## 2018-07-07 ENCOUNTER — Telehealth (HOSPITAL_COMMUNITY): Payer: Self-pay | Admitting: Cardiac Rehabilitation

## 2018-07-07 ENCOUNTER — Encounter (HOSPITAL_COMMUNITY): Payer: BLUE CROSS/BLUE SHIELD

## 2018-07-07 ENCOUNTER — Ambulatory Visit (HOSPITAL_COMMUNITY): Payer: BLUE CROSS/BLUE SHIELD

## 2018-07-07 ENCOUNTER — Telehealth: Payer: Self-pay | Admitting: Internal Medicine

## 2018-07-07 DIAGNOSIS — K5090 Crohn's disease, unspecified, without complications: Secondary | ICD-10-CM

## 2018-07-07 NOTE — Telephone Encounter (Signed)
Pt phone call to inform of continued Outpatient Cardiac Rehab departmental closure for COVID 19 precautions. Future opening date to be determined. Left message on answering machine.  Andi Hence, RN, BSN Cardiac Pulmonary Rehab

## 2018-07-07 NOTE — Telephone Encounter (Signed)
Patient has a hx of Crohn's, Vasculitis, and eosinophilic pneumonitis has been seeing [facility],  Dr. Joaquin Bend rheumatology,  states he was instructed to taper his prednisone from 20 mg and taper by 1 mg a month,.  He was  down to 15 mg and his diarrhea has returned. He returned to 20 mg prednisone about 2 weeks ago.  He is still having multiple stools a day despite lomotil 1-3 pills a day.  Patient was on 6MP about 6 months ago was changed to azathioprine, but he quit taking that very soon after the change.  He is maintained on Entyvio for his Crohn's and gets his infusions q 7 weeks.  Patient is asking what else he can do with the diarrhea. He is scheudled at [facility] for neurosurgery in the beginning of May for spinal repair ???? He has not been seen here in the office by Dr. [name] since 2018.  I scheduled him an office visit with Dr. [name] for when he returns 07/20/18.  Dr. Tarri Glenn you are MD of the day.  Does he need anything until OV with Dr. [name]?

## 2018-07-07 NOTE — Telephone Encounter (Signed)
Pt called to inform that he decreased does of prednisone to 16 mg but it did not work as he began having diarrhea. He then increased it to 3m but diarrhea has not stop. He would like some advise.

## 2018-07-07 NOTE — Telephone Encounter (Signed)
Patient notified of the recommendations He will come for labs one day this week He is to come and pick up the kit for Entyvio trough prior to his appt for infusion on 07/21/18. Verbalized understanding of dietary restrictions and other instructions.

## 2018-07-07 NOTE — Telephone Encounter (Signed)
It sounds like an office visit will be important to determine if he needs to resume his azathioprine in addition to steroids and Entyvio. (Please confirm that Entyvio level was ordered and obtained prior to next infusion as previously ordered by Dr. [name]). He should continue the prednisone at 20 mg daily for now.   I recommend that he avoid all dairy products. Do not eat any milk, cheese, sour cream, ice cream, yogurt, creamer, baked goods (cookies/cakes/breads), chocolate (even dark chocolate), protein bars, dressing/condiments (this requires extra attention!) for the next 3 weeks.   He should also avoid all NSAIDs.  He may use loperamine 2-4 mg after each loose stool after he submits his stool for the studies outlined below.   In the meantime, please ask him to come in for stool studies including Stool for GI pathogen, fecal calprotectin, and C diff. Labs for BMP, TSH, and CBC with diff. Those results may allow [place] to tailor his treatment approach.

## 2018-07-08 ENCOUNTER — Telehealth: Payer: Self-pay

## 2018-07-08 ENCOUNTER — Telehealth: Payer: Self-pay | Admitting: *Deleted

## 2018-07-08 NOTE — Telephone Encounter (Signed)
lmom for prescreen/drive thru 

## 2018-07-08 NOTE — Telephone Encounter (Signed)

## 2018-07-09 ENCOUNTER — Ambulatory Visit (HOSPITAL_COMMUNITY): Payer: BLUE CROSS/BLUE SHIELD

## 2018-07-09 ENCOUNTER — Encounter (HOSPITAL_COMMUNITY): Payer: BLUE CROSS/BLUE SHIELD

## 2018-07-09 ENCOUNTER — Other Ambulatory Visit: Payer: Self-pay

## 2018-07-09 ENCOUNTER — Ambulatory Visit (INDEPENDENT_AMBULATORY_CARE_PROVIDER_SITE_OTHER): Payer: BLUE CROSS/BLUE SHIELD | Admitting: Pharmacist

## 2018-07-09 DIAGNOSIS — I236 Thrombosis of atrium, auricular appendage, and ventricle as current complications following acute myocardial infarction: Secondary | ICD-10-CM

## 2018-07-09 DIAGNOSIS — Z7901 Long term (current) use of anticoagulants: Secondary | ICD-10-CM

## 2018-07-09 LAB — POCT INR: INR: 3.4 — AB (ref 2.0–3.0)

## 2018-07-12 ENCOUNTER — Ambulatory Visit: Payer: BLUE CROSS/BLUE SHIELD | Admitting: Physician Assistant

## 2018-07-12 ENCOUNTER — Ambulatory Visit (HOSPITAL_COMMUNITY): Payer: BLUE CROSS/BLUE SHIELD

## 2018-07-12 ENCOUNTER — Encounter (HOSPITAL_COMMUNITY): Payer: BLUE CROSS/BLUE SHIELD

## 2018-07-14 ENCOUNTER — Encounter (HOSPITAL_COMMUNITY): Payer: BLUE CROSS/BLUE SHIELD

## 2018-07-14 ENCOUNTER — Ambulatory Visit (HOSPITAL_COMMUNITY): Payer: BLUE CROSS/BLUE SHIELD

## 2018-07-15 ENCOUNTER — Other Ambulatory Visit (INDEPENDENT_AMBULATORY_CARE_PROVIDER_SITE_OTHER): Payer: BLUE CROSS/BLUE SHIELD

## 2018-07-15 DIAGNOSIS — K5090 Crohn's disease, unspecified, without complications: Secondary | ICD-10-CM | POA: Diagnosis not present

## 2018-07-15 LAB — CBC WITH DIFFERENTIAL/PLATELET
Basophils Absolute: 0.1 10*3/uL (ref 0.0–0.1)
Basophils Relative: 0.5 % (ref 0.0–3.0)
Eosinophils Absolute: 0.1 10*3/uL (ref 0.0–0.7)
Eosinophils Relative: 0.4 % (ref 0.0–5.0)
HCT: 36.3 % — ABNORMAL LOW (ref 39.0–52.0)
Hemoglobin: 11.4 g/dL — ABNORMAL LOW (ref 13.0–17.0)
Lymphocytes Relative: 9.1 % — ABNORMAL LOW (ref 12.0–46.0)
Lymphs Abs: 1.1 10*3/uL (ref 0.7–4.0)
MCHC: 31.3 g/dL (ref 30.0–36.0)
MCV: 77.7 fl — ABNORMAL LOW (ref 78.0–100.0)
Monocytes Absolute: 0.9 10*3/uL (ref 0.1–1.0)
Monocytes Relative: 6.9 % (ref 3.0–12.0)
Neutro Abs: 10.2 10*3/uL — ABNORMAL HIGH (ref 1.4–7.7)
Neutrophils Relative %: 83.1 % — ABNORMAL HIGH (ref 43.0–77.0)
Platelets: 282 10*3/uL (ref 150.0–400.0)
RBC: 4.68 Mil/uL (ref 4.22–5.81)
RDW: 15.9 % — ABNORMAL HIGH (ref 11.5–15.5)
WBC: 12.3 10*3/uL — ABNORMAL HIGH (ref 4.0–10.5)

## 2018-07-15 LAB — BASIC METABOLIC PANEL
BUN: 22 mg/dL (ref 6–23)
CO2: 28 mEq/L (ref 19–32)
Calcium: 8.7 mg/dL (ref 8.4–10.5)
Chloride: 104 mEq/L (ref 96–112)
Creatinine, Ser: 1.28 mg/dL (ref 0.40–1.50)
GFR: 59.26 mL/min — ABNORMAL LOW (ref >60.00)
Glucose, Bld: 114 mg/dL — ABNORMAL HIGH (ref 70–99)
Potassium: 4.5 mEq/L (ref 3.5–5.1)
Sodium: 138 mEq/L (ref 135–145)

## 2018-07-15 LAB — TSH: TSH: 0.86 u[IU]/mL (ref 0.35–4.50)

## 2018-07-16 ENCOUNTER — Telehealth: Payer: Self-pay

## 2018-07-16 ENCOUNTER — Encounter (HOSPITAL_COMMUNITY): Payer: BLUE CROSS/BLUE SHIELD

## 2018-07-16 ENCOUNTER — Ambulatory Visit (HOSPITAL_COMMUNITY): Payer: BLUE CROSS/BLUE SHIELD

## 2018-07-16 ENCOUNTER — Other Ambulatory Visit: Payer: BLUE CROSS/BLUE SHIELD

## 2018-07-16 DIAGNOSIS — K5090 Crohn's disease, unspecified, without complications: Secondary | ICD-10-CM

## 2018-07-16 NOTE — Telephone Encounter (Signed)
Virtual Visit Pre-Appointment Phone Call  Steps For Call:  1. Confirm consent - "In the setting of the current Covid19 crisis, you are scheduled for a (phone or video) visit with your provider on (date) at (time).  Just as we do with many in-office visits, in order for you to participate in this visit, we must obtain consent.  If you'd like, I can send this to your mychart (if signed up) or email for you to review.  Otherwise, I can obtain your verbal consent now.  All virtual visits are billed to your insurance company just like a normal visit would be.  By agreeing to a virtual visit, we'd like you to understand that the technology does not allow for your provider to perform an examination, and thus may limit your provider's ability to fully assess your condition. If your provider identifies any concerns that need to be evaluated in person, we will make arrangements to do so.  Finally, though the technology is pretty good, we cannot assure that it will always work on either your or our end, and in the setting of a video visit, we may have to convert it to a phone-only visit.  In either situation, we cannot ensure that we have a secure connection.  Are you willing to proceed?" Yes  2. Confirm the BEST phone number to call the day of the visit by including in appointment notes  3. Give patient instructions for WebEx/MyChart download to smartphone as below or Doximity/Doxy.me if video visit (depending on what platform provider is using)  4. Advise patient to be prepared with their blood pressure, heart rate, weight, any heart rhythm information, their current medicines, and a piece of paper and pen handy for any instructions they may receive the day of their visit  5. Inform patient they will receive a phone call 15 minutes prior to their appointment time (may be from unknown caller ID) so they should be prepared to answer  6. Confirm that appointment type is correct in Epic appointment notes  (VIDEO vs PHONE)     TELEPHONE CALL NOTE  Jack [name] has been deemed a candidate for a follow-up tele-health visit to limit community exposure during the Covid-19 pandemic. I spoke with the patient via phone to ensure availability of phone/video source, confirm preferred email & phone number, and discuss instructions and expectations.  I reminded Jack [name] to be prepared with any vital sign and/or heart rhythm information that could potentially be obtained via home monitoring, at the time of his visit. I reminded Jack [name] to expect a phone call at the time of his visit if his visit.  Virda Betters, [facility] 07/16/2018 5:16 PM   INSTRUCTIONS FOR DOWNLOADING THE [place] APP TO SMARTPHONE  - If Apple, ask patient to go to App Store and type in WebEx in the search bar. [place] [employer], the blue/green circle. If Android, go to [employer] and type in [employer] in the search bar. The app is free but as with any other app downloads, their phone may require them to verify saved payment information or Apple/Android password.  - The patient does NOT have to create an account. - On the day of the visit, the assist will walk the patient through joining the meeting with the meeting number/password.  INSTRUCTIONS FOR DOWNLOADING THE MYCHART APP TO SMARTPHONE  - The patient must first make sure to have activated MyChart and know their login information - If  Apple, go to [employer] and type in MyChart in the search bar and download the app. If Android, ask patient to go to [employer] and type in [place] in the search bar and download the app. The app is free but as with any other app downloads, their phone may require them to verify saved payment information or Apple/Android password.  - The patient will need to then log into the app with their MyChart username and password, and select [facility] as their healthcare provider to link the account. When it is time  for your visit, go to the MyChart app, find appointments, and click Begin Video Visit. Be sure to Select Allow for your device to access the Microphone and Camera for your visit. You will then be connected, and your provider will be with you shortly.  **If they have any issues connecting, or need assistance please contact MyChart service desk [phone])**  **If using a computer, in order to ensure the best quality for their visit they will need to use either of the following Internet Browsers: [employer], or Google Chrome**  IF USING DOXIMITY or DOXY.ME - The patient will receive a link just prior to their visit, either by text or email (to be determined day of appointment depending on if it's doxy.me or Doximity).     FULL LENGTH CONSENT FOR TELE-HEALTH VISIT   I hereby voluntarily request, consent and authorize [facility] and its employed or contracted physicians, physician assistants, nurse practitioners or other licensed health care professionals (the Practitioner), to provide me with telemedicine health care services (the "Services") as deemed necessary by the treating Practitioner. I acknowledge and consent to receive the Services by the Practitioner via telemedicine. I understand that the telemedicine visit will involve communicating with the Practitioner through live audiovisual communication technology and the disclosure of certain medical information by electronic transmission. I acknowledge that I have been given the opportunity to request an in-person assessment or other available alternative prior to the telemedicine visit and am voluntarily participating in the telemedicine visit.  I understand that I have the right to withhold or withdraw my consent to the use of telemedicine in the course of my care at any time, without affecting my right to future care or treatment, and that the Practitioner or I may terminate the telemedicine visit at any time. I understand  that I have the right to inspect all information obtained and/or recorded in the course of the telemedicine visit and may receive copies of available information for a reasonable fee.  I understand that some of the potential risks of receiving the Services via telemedicine include:  [name] Delay or interruption in medical evaluation due to technological equipment failure or disruption; . Information transmitted may not be sufficient (e.g. poor resolution of images) to allow for appropriate medical decision making by the Practitioner; and/or  . In rare instances, security protocols could fail, causing a breach of personal health information.  Furthermore, I acknowledge that it is my responsibility to provide information about my medical history, conditions and care that is complete and accurate to the best of my ability. I acknowledge that Practitioner's advice, recommendations, and/or decision may be based on factors not within their control, such as incomplete or inaccurate data provided by me or distortions of diagnostic images or specimens that may result from electronic transmissions. I understand that the practice of medicine is not an exact science and that Practitioner makes no warranties or guarantees regarding treatment outcomes.  I acknowledge that I will receive a copy of this consent concurrently upon execution via email to the email address I last provided but may also request a printed copy by calling the office of [facility].    I understand that my insurance will be billed for this visit.   I have read or had this consent read to me. . I understand the contents of this consent, which adequately explains the benefits and risks of the Services being provided via telemedicine.  . I have been provided ample opportunity to ask questions regarding this consent and the Services and have had my questions answered to my satisfaction. . I give my informed consent for the services to be provided  through the use of telemedicine in my medical care  By participating in this telemedicine visit I agree to the above.

## 2018-07-18 NOTE — Progress Notes (Addendum)
Virtual Visit via Video Note   This visit type was conducted due to national recommendations for restrictions regarding the COVID-19 Pandemic (e.g. social distancing) in an effort to limit this patient's exposure and mitigate transmission in our community.  Due to his co-morbid illnesses, this patient is at least at moderate risk for complications without adequate follow up.  This format is felt to be most appropriate for this patient at this time.  All issues noted in this document were discussed and addressed.  A limited physical exam was performed with this format.  Please refer to the patient's chart for his consent to telehealth for [facility].   Evaluation Performed:  Follow-up visit  Date:  07/19/2018   ID:  Jack [name], DOB June 11, 1965, MRN [id number]  Patient Location: Home Provider Location: Office  PCP:  [name], MD  Cardiologist:  Sinclair Grooms, MD  Electrophysiologist:  None   Chief Complaint:  CAD/CHF  History of Present Illness:    Jack [name] is a 53 y.o. male with   a hx of eosinophilic granulomatosis, history of GI bleeding, essential hypertension, seizure disorder, Crohn's disease, who presented with ST elevation myocardial infarction involving the LAD territory in September 2019.  Alternate decrease in LV systolic function with EF approximately 35% and LV thrombus requiring Coumadin therapy.  Received a drug-eluting stent to the LAD and has residual moderate CAD in circumflex and RCA territory.  Developed post infarct pericarditis. Has subsequently had pause of antiplatelet therapy to treat umbar disk.  Jack [name] is doing well.  He has significant limitations due to his back.  Definitive surgery is scheduled for mid May.  He looks forward to the surgical procedure.  He is currently stable on aspirin and clopidogrel.  He has no ischemic symptoms and denies shortness of breath/orthopnea/PND, and ankle edema that would suggest CHF and volume  overload.  He is relatively sedentary.  He has not needed to use nitroglycerin.  The patient does not have symptoms concerning for COVID-19 infection (fever, chills, cough, or new shortness of breath).    Past Medical History:  Diagnosis Date  . BPH (benign prostatic hypertrophy)   . CAD (coronary artery disease)    a. anterior STEMI 01/2018 -  proximal occlusion of LAD, treated with DES. Cath also showed 20% distal LM, 95% ostial-prox small-moderate ramus, 70-80% ostial Cx, 70% dominant ostial OM, 50-60% prox Cx, 70% RCA. EF 35% by cath with LVEDP 64mHg. Med rx for residual disease. Course complicated by post MI pericarditis and LV thrombus.  . Chronic systolic (congestive) heart failure ([place]   . Colitis   . Colon polyp    inflammatory  . Crohn disease ([place] 1992   history uveitis, involvement of intestines and lungs  . Diverticulosis   . Eosinophilic granuloma ([place]   . Essential hypertension   . GERD (gastroesophageal reflux disease)   . GI bleeding   . History of chicken pox   . History of gastroesophageal reflux (GERD)   . History of seizure 1995   grand mal x1, completed 6 yrs dilantin. no seizures since  . Hyperlipidemia   . Internal hemorrhoids   . Ischemic cardiomyopathy   . LV (left ventricular) mural thrombus following MI ([place] 01/2018  . Osteoporosis 11/2015   DEXA T -2.9  . Schwannoma 2007   L axilla s/p surgery  . Seizures ([place]    last seizure 1995 and only x 1 seizure-   . Ulnar neuropathy  h/o this from L arm schwannoma   Past Surgical History:  Procedure Laterality Date  . APPENDECTOMY  2000  . CARDIAC CATHETERIZATION    . CARDIOVASCULAR STRESS TEST  01/2015   low risk study  . CHOLECYSTECTOMY  2000  . COLONOSCOPY  08/2014   f/u crohn's, 4 inflammatory polyps, diverticulosis, improved, rpt 2 yrs ([place])  . COLONOSCOPY WITH PROPOFOL N/A 04/09/2018   inflammatory polyp, f/u left to primary GI - [facility] (Danis, Kirke Corin, MD)  . CORONARY  ANGIOGRAPHY N/A 02/21/2018   Procedure: CORONARY ANGIOGRAPHY;  Surgeon: Belva Crome, MD;  Location: [facility];  Service: Cardiovascular;  Laterality: N/A;  . CORONARY/GRAFT ACUTE MI REVASCULARIZATION N/A 02/20/2018   Procedure: Coronary/Graft Acute MI Revascularization;  Surgeon: Belva Crome, MD;  Location: [facility];  Service: Cardiovascular;  Laterality: N/A;  . ESOPHAGOGASTRODUODENOSCOPY  07/2014   h/o EE resolved, focal reflux esophagitis, chronic active gastritis  . extremity surgery Left    ulnar nerve repair after schwannoma removal  . FLEXIBLE SIGMOIDOSCOPY N/A 04/14/2018   Procedure: FLEXIBLE SIGMOIDOSCOPY;  Surgeon: Milus Banister, MD;  Location: [facility] ENDOSCOPY;  Service: Endoscopy;  Laterality: N/A;  . INGUINAL HERNIA REPAIR Bilateral 2017  . LEFT HEART CATH AND CORONARY ANGIOGRAPHY N/A 02/20/2018   Procedure: LEFT HEART CATH AND CORONARY ANGIOGRAPHY;  Surgeon: Belva Crome, MD;  Location: [facility];  Service: Cardiovascular;  Laterality: N/A;  . LUMBAR SPINE SURGERY  2011  . POLYPECTOMY  04/09/2018   Procedure: POLYPECTOMY;  Surgeon: Doran Stabler, MD;  Location: [facility] ENDOSCOPY;  Service: Gastroenterology;;  . SPINAL CORD STIMULATOR IMPLANT  2012   x2 (Dr Berton Mount)  . SUBMUCOSAL INJECTION  04/09/2018   Procedure: SUBMUCOSAL INJECTION;  Surgeon: Doran Stabler, MD;  Location: [facility] ENDOSCOPY;  Service: Gastroenterology;;  . TONSILLECTOMY  1996  . TUMOR REMOVAL Left 2007   schwannoma from L armpit  . UPPER GASTROINTESTINAL ENDOSCOPY       Current Meds  Medication Sig  . acetaminophen (TYLENOL) 325 MG tablet Take 650 mg by mouth every 6 (six) hours as needed for mild pain or headache.   . alendronate (FOSAMAX) 70 MG tablet Take 70 mg by mouth every Saturday.   [name] atorvastatin (LIPITOR) 80 MG tablet Take 1 tablet (80 mg total) by mouth daily at 6 PM.  . Calcium Carbonate-Vit D-Min (CALCIUM 1200 PO) Take 1 capsule by mouth 2 (two) times daily.   . carvedilol (COREG) 6.25 MG tablet Take 1 tablet (6.25 mg total) by mouth 2 (two) times daily.  . clopidogrel (PLAVIX) 75 MG tablet Take 1 tablet (75 mg total) by mouth daily.  . diphenoxylate-atropine (LOMOTIL) 2.5-0.025 MG tablet Take 1-2 tablets by mouth 4 times daily as needed for diarrhea  . furosemide (LASIX) 20 MG tablet Take only as needed  . gabapentin (NEURONTIN) 300 MG capsule Take 1 capsule (300 mg total) by mouth 3 (three) times daily. Take one capsule TID  . HYDROcodone-acetaminophen (NORCO/VICODIN) 5-325 MG tablet Take 1 tablet by mouth every 6 (six) hours as needed. for pain  . hydrOXYzine (ATARAX/VISTARIL) 25 MG tablet TAKE 4 TABLETS (100 MG TOTAL) BY MOUTH AT BEDTIME.  [name] losartan (COZAAR) 25 MG tablet Take 25 mg by mouth daily.  . Multiple Vitamin (MULTIVITAMIN) capsule Take 1 capsule by mouth daily.  . nitroGLYCERIN (NITROSTAT) 0.4 MG SL tablet Place 1 tablet (0.4 mg total) under the tongue every 5 (five) minutes as needed for chest  pain.  . pantoprazole (PROTONIX) 40 MG tablet Take 1 tablet (40 mg total) by mouth 2 (two) times daily.  . predniSONE (DELTASONE) 10 MG tablet Take 20 mg by mouth daily.   [name] spironolactone (ALDACTONE) 25 MG tablet Take 0.5 tablets (12.5 mg total) by mouth daily.  . tadalafil (CIALIS) 5 MG tablet TAKE ONE TABLET BY MOUTH DAILY AS NEEDED FOR ERECTILE DYSFUNCTION  . tamsulosin (FLOMAX) 0.4 MG CAPS capsule TAKE 1 CAPSULE (0.4 MG TOTAL) BY MOUTH DAILY.  . vedolizumab (ENTYVIO) 300 MG injection Inject 300 mg into the vein every 7 (seven) weeks.      Allergies:   Cephalexin and Duloxetine   Social History   Tobacco Use  . Smoking status: Former Smoker    Types: Cigars    Last attempt to quit: 02/20/2018    Years since quitting: 0.4  . Smokeless tobacco: Never Used  Substance Use Topics  . Alcohol use: Yes    Alcohol/week: 7.0 standard drinks    Types: 7 Cans of beer per week    Comment: 1 beer/day  . Drug use: No     Family Hx: The  patient's family history includes CAD (age of onset: 1) in his father and mother; Cancer in his maternal grandmother; Colon cancer in his maternal grandfather; Congenital heart disease in his mother; Hyperlipidemia in his father and mother; Hypertension in his father and mother. There is no history of Diabetes, Colon polyps, Esophageal cancer, Rectal cancer, or Stomach cancer.  ROS:   Please see the history of present illness.    Back discomfort due to lumbar disc disease.  No medication side effects.  No blood in the urine or stool. All other systems reviewed and are negative.   Prior CV studies:   The following studies were reviewed today:  He has not had any updated laboratory, functional, or imaging data.  Labs/Other Tests and Data Reviewed:    EKG:  No ECG reviewed.  Recent Labs: 02/20/2018: B Natriuretic Peptide 14.9 03/22/2018: NT-Pro BNP 2,573 04/07/2018: ALT 32 04/12/2018: Magnesium 2.1 07/15/2018: BUN 22; Creatinine, Ser 1.28; Hemoglobin 11.4; Platelets 282.0; Potassium 4.5; Sodium 138; TSH 0.86   Recent Lipid Panel Lab Results  Component Value Date/Time   CHOL 144 04/28/2018 09:10 AM   TRIG 154 (H) 04/28/2018 09:10 AM   HDL 38 (L) 04/28/2018 09:10 AM   CHOLHDL 3.8 04/28/2018 09:10 AM   CHOLHDL 4.5 02/20/2018 11:45 PM   LDLCALC 75 04/28/2018 09:10 AM    Wt Readings from Last 3 Encounters:  07/19/18 164 lb (74.4 kg)  05/27/18 174 lb 3.2 oz (79 kg)  05/11/18 174 lb 2.6 oz (79 kg)     Objective:    Vital Signs:     VITAL SIGNS:  reviewed GEN:  no acute distress  ASSESSMENT & PLAN:    1. CAD in native artery   2. Ischemic cardiomyopathy   3. Essential hypertension   4. Hyperlipidemia, unspecified hyperlipidemia type   5. LV (left ventricular) mural thrombus following MI ([place])   6. History of lumbar surgery   7. Educated About Covid-19 Virus Infection    PLAN in order of problem:  1. He is stable from the cardiac standpoint.  He is at high risk for future  vascular events.  Risk factor modification efforts are underway.  He is relatively immobile due to severe back issues that will hopefully improve with upcoming surgery.  LDL was near 70 when checked in January.  Triglycerides were 170 hence  he should achieve benefit from the icosapent ethyl 2 g twice daily.  Modifiable risk factors and metrics were discussed in detail as outlined below. 2. No clinical evidence of volume overload or heart failure. 3. Blood pressure is adequately controlled with today's values being obtained prior to taking a.m. medications. 4. Please see above with reference to elevated triglycerides and the addition of icosapent ethyl. 5. After back surgery the patient will discontinue apixaban. 6. Upcoming in May.  The patient has been cleared to proceed.  He will discontinue clopidogrel and apixaban prior to the procedure, resuming clopidogrel as soon as possible and safe after the procedure.  Plan clinical follow-up and further discussion of risk factor modification for later this summer.  Will need blood work to follow-up after addition of icosapent ethyl.  Overall education and awareness concerning primary/secondary risk prevention was discussed in detail: LDL less than 70, hemoglobin A1c less than 7, blood pressure target less than 130/80 mmHg, >150 minutes of moderate aerobic activity per week, avoidance of smoking, weight control (via diet and exercise), and continued surveillance/management of/for obstructive sleep apnea.   COVID-19 Education: The signs and symptoms of COVID-19 were discussed with the patient and how to seek care for testing (follow up with PCP or arrange E-visit).  The importance of social distancing was discussed today.  Time:   Today, I have spent 15 minutes with the patient with telehealth technology discussing the above problems.     Medication Adjustments/Labs and Tests Ordered: Current medicines are reviewed at length with the patient today.   Concerns regarding medicines are outlined above.   Tests Ordered: No orders of the defined types were placed in this encounter.   Medication Changes: Meds ordered this encounter  Medications  . Icosapent Ethyl (VASCEPA) 1 g CAPS    Sig: Take 2 capsules (2 g total) by mouth 2 (two) times daily.    Dispense:  120 capsule    Refill:  11    Disposition:  Follow up in 4 month(s)  Signed, Sinclair Grooms, MD  07/19/2018 1:18 PM    [facility]

## 2018-07-19 ENCOUNTER — Telehealth (INDEPENDENT_AMBULATORY_CARE_PROVIDER_SITE_OTHER): Payer: BLUE CROSS/BLUE SHIELD | Admitting: Interventional Cardiology

## 2018-07-19 ENCOUNTER — Other Ambulatory Visit: Payer: Self-pay | Admitting: *Deleted

## 2018-07-19 ENCOUNTER — Encounter: Payer: Self-pay | Admitting: *Deleted

## 2018-07-19 ENCOUNTER — Other Ambulatory Visit (INDEPENDENT_AMBULATORY_CARE_PROVIDER_SITE_OTHER): Payer: BLUE CROSS/BLUE SHIELD

## 2018-07-19 ENCOUNTER — Encounter: Payer: Self-pay | Admitting: Internal Medicine

## 2018-07-19 ENCOUNTER — Ambulatory Visit (HOSPITAL_COMMUNITY): Payer: BLUE CROSS/BLUE SHIELD

## 2018-07-19 ENCOUNTER — Telehealth: Payer: Self-pay | Admitting: Internal Medicine

## 2018-07-19 ENCOUNTER — Telehealth: Payer: Self-pay

## 2018-07-19 ENCOUNTER — Other Ambulatory Visit: Payer: Self-pay

## 2018-07-19 ENCOUNTER — Encounter (HOSPITAL_COMMUNITY): Payer: BLUE CROSS/BLUE SHIELD

## 2018-07-19 ENCOUNTER — Encounter (HOSPITAL_BASED_OUTPATIENT_CLINIC_OR_DEPARTMENT_OTHER): Payer: BLUE CROSS/BLUE SHIELD

## 2018-07-19 ENCOUNTER — Encounter: Payer: Self-pay | Admitting: Interventional Cardiology

## 2018-07-19 VITALS — BP 144/83 | HR 75 | Ht 67.0 in | Wt 164.0 lb

## 2018-07-19 DIAGNOSIS — I2510 Atherosclerotic heart disease of native coronary artery without angina pectoris: Secondary | ICD-10-CM | POA: Diagnosis not present

## 2018-07-19 DIAGNOSIS — I10 Essential (primary) hypertension: Secondary | ICD-10-CM | POA: Diagnosis not present

## 2018-07-19 DIAGNOSIS — E785 Hyperlipidemia, unspecified: Secondary | ICD-10-CM | POA: Diagnosis not present

## 2018-07-19 DIAGNOSIS — I255 Ischemic cardiomyopathy: Secondary | ICD-10-CM | POA: Diagnosis not present

## 2018-07-19 DIAGNOSIS — Z7189 Other specified counseling: Secondary | ICD-10-CM

## 2018-07-19 DIAGNOSIS — Z9889 Other specified postprocedural states: Secondary | ICD-10-CM

## 2018-07-19 DIAGNOSIS — I236 Thrombosis of atrium, auricular appendage, and ventricle as current complications following acute myocardial infarction: Secondary | ICD-10-CM

## 2018-07-19 DIAGNOSIS — K5090 Crohn's disease, unspecified, without complications: Secondary | ICD-10-CM | POA: Diagnosis not present

## 2018-07-19 DIAGNOSIS — K5010 Crohn's disease of large intestine without complications: Secondary | ICD-10-CM | POA: Diagnosis not present

## 2018-07-19 LAB — IBC PANEL
Iron: 26 ug/dL — ABNORMAL LOW (ref 42–165)
Saturation Ratios: 6.3 % — ABNORMAL LOW (ref 20.0–50.0)
Transferrin: 295 mg/dL (ref 212.0–360.0)

## 2018-07-19 LAB — FERRITIN: Ferritin: 82.7 ng/mL (ref 22.0–322.0)

## 2018-07-19 MED ORDER — ICOSAPENT ETHYL 1 G PO CAPS: 2.0000 g | ORAL_CAPSULE | Freq: Two times a day (BID) | ORAL | 11 refills | Status: DC

## 2018-07-19 NOTE — Patient Instructions (Addendum)
Medication Instructions:  Your physician has recommended you make the following change in your medication:  START Vascepa (Isosapent ethyl) 2 grams (2 pills) twice daily   If you need a refill on your cardiac medications before your next appointment, please call your pharmacy.    Lab work: None Ordered     Testing/Procedures: Your physician has recommended that you have a sleep study. This test records several body functions during sleep, including: brain activity, eye movement, oxygen and carbon dioxide blood levels, heart rate and rhythm, breathing rate and rhythm, the flow of air through your mouth and nose, snoring, body muscle movements, and chest and belly movement. **Our office will call you to reschedule   Follow-Up: At [facility], you and your health needs are our priority.  As part of our continuing mission to provide you with exceptional heart care, we have created designated Provider Care Teams.  These Care Teams include your primary Cardiologist (physician) and Advanced Practice Providers (APPs -  Physician Assistants and Nurse Practitioners) who all work together to provide you with the care you need, when you need it. You have a follow up appointment in 4 months on Monday August 24 at 9:30 am with Truitt Merle, NP.

## 2018-07-19 NOTE — Telephone Encounter (Signed)
Patient is returning your call.  

## 2018-07-19 NOTE — Telephone Encounter (Signed)
I did a Vascepa PA through covermymeds. Key: [id number]    Received the following message through covermymeds: Jack [name] (Key: [id number])  Rx #: [id number]  Vascepa 1GM capsules  Form [employer] Electronic Request Form (CB)   Submit Clinical Questions  4 minutes ago  Determination  Favorable  4 minutes ago  Message from Plan Effective from 07/19/2018 through 07/18/2019.  I have notified the pts pharmacy of this approval.

## 2018-07-19 NOTE — Telephone Encounter (Signed)
Spoke with patient and completed prescreen.

## 2018-07-20 ENCOUNTER — Encounter: Payer: Self-pay | Admitting: Internal Medicine

## 2018-07-20 ENCOUNTER — Ambulatory Visit (INDEPENDENT_AMBULATORY_CARE_PROVIDER_SITE_OTHER): Payer: BLUE CROSS/BLUE SHIELD | Admitting: Internal Medicine

## 2018-07-20 ENCOUNTER — Other Ambulatory Visit: Payer: Self-pay

## 2018-07-20 VITALS — Ht 67.0 in | Wt 160.0 lb

## 2018-07-20 DIAGNOSIS — D509 Iron deficiency anemia, unspecified: Secondary | ICD-10-CM

## 2018-07-20 DIAGNOSIS — K529 Noninfective gastroenteritis and colitis, unspecified: Secondary | ICD-10-CM | POA: Diagnosis not present

## 2018-07-20 DIAGNOSIS — K50819 Crohn's disease of both small and large intestine with unspecified complications: Secondary | ICD-10-CM | POA: Diagnosis not present

## 2018-07-20 DIAGNOSIS — Z79899 Other long term (current) drug therapy: Secondary | ICD-10-CM | POA: Diagnosis not present

## 2018-07-20 DIAGNOSIS — D84821 Immunodeficiency due to drugs: Secondary | ICD-10-CM

## 2018-07-20 DIAGNOSIS — Z7952 Long term (current) use of systemic steroids: Secondary | ICD-10-CM

## 2018-07-21 ENCOUNTER — Other Ambulatory Visit: Payer: Self-pay | Admitting: Family Medicine

## 2018-07-21 ENCOUNTER — Ambulatory Visit (HOSPITAL_COMMUNITY): Payer: BLUE CROSS/BLUE SHIELD

## 2018-07-21 ENCOUNTER — Encounter (HOSPITAL_COMMUNITY): Payer: BLUE CROSS/BLUE SHIELD

## 2018-07-21 ENCOUNTER — Encounter: Payer: Self-pay | Admitting: Internal Medicine

## 2018-07-21 DIAGNOSIS — K5090 Crohn's disease, unspecified, without complications: Secondary | ICD-10-CM | POA: Diagnosis not present

## 2018-07-21 MED ORDER — PANTOPRAZOLE SODIUM 40 MG PO TBEC: 40.0000 mg | DELAYED_RELEASE_TABLET | Freq: Every day | ORAL | 0 refills | Status: DC

## 2018-07-21 MED ORDER — FERROUS SULFATE 325 (65 FE) MG PO TABS: 325.0000 mg | ORAL_TABLET | Freq: Every day | ORAL | 0 refills | Status: DC

## 2018-07-21 NOTE — Progress Notes (Signed)
Subjective:    Patient ID: Jack [name], male    DOB: [birth year], 53 y.o.   MRN: [id number]  This service was provided via telemedicine.  Zoom platform with A/V communication The patient was located at home The provider was located in office The patient did consent to this telephone visit and is aware of possible charges through their insurance for this visit.   The other persons participating in this telemedicine service were the patient, his wife Jack [name] and I Time spent on call: 30 min   HPI Jack [name] is a 53 year old male with a past medical history of ileocolonic Crohn's disease, chronic eosinophilic pneumonitis/vasculitis on chronic prednisone, history of CAD status post myocardial infarction and drug-eluting stent placement in November 2019 on Plavix who is seen by virtual visit for follow-up.  He was seen in the hospital setting in January when he had hematochezia.    During this hospitalization he underwent a colonoscopy on 04/09/2018 by Dr. [name] to evaluate bleeding.  There was no evidence of active colitis on this examination.  Pseudopolyps were found from the transverse to descending colon 1 with an erosion but not active bleeding.  In the rectum there was a 5 mm frond-like polyp felt to be the source for bleeding.  This was injected with epinephrine removed by hot biopsy forcep and a hemostatic clip was placed.  Pathology on this lesion was found to be an inflammatory polyp without dysplasia.  Further bleeding occurred and he underwent a flexible sigmoidoscopy by Dr. Ardis Hughs on 04/14/2018 where an additional hemostatic clip was placed on this lesion.  Jack [name] has been maintained on chronic prednisone and is followed by rheumatology at [facility] for his eosinophilic vasculitis/pneumonitis.  His prednisone doses have changed from 20 mg to the 15 mg range.  He was previously on 6-MP at a dose of 100 mg daily but this was changed to azathioprine 150 mg daily within the last year or so.  Jack [name]  stopped this medication in the summer 2019 because it was not seeming to allow for lowering of the prednisone dose.  He was under the impression that this is why this medication was started.  Around his hospitalization he was having trouble with diarrhea.  It was felt to be related to medication specifically colchicine which was started for pericarditis.  This has subsequently been stopped.  He has been using Lomotil 1 to 2 tablets in the morning and in the evening.  He has had on and off issues with loose stools and diarrhea.  On bad days he can have 4-6 stools per day which includes 1-2 stools at night which wakes him from sleep.  This is even with using Lomotil.  He reports having no solid stool for months.  There is been no blood in his stool or melena since his hospitalization.  He is not having abdominal pain.  He does not feel that this diarrhea is specifically related to his Crohn's disease because the lack of abdominal pain.  His appetite has fluctuated.  He has had no GERD symptoms or indigestion.  He has been taking pantoprazole 40 mg twice a day.  He is currently on prednisone 20 mg which he increased from 15 to see if his diarrhea would improve.  It has not seem to change much.  He has been receiving Entyvio and having previously been every 8-week dosing he requested to go to every 7-week dosing because he was having recurrent Crohn's-like symptoms prior to infusion.  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  We did labs recently including an Entyvio trough level which is pending.  C. difficile PCR was negative.  GI pathogen panel and fecal calprotectin are pending.  Hemoglobin was 11.5 down about a gram and a half.  Iron stores were mixed with ferritin being normal at 88 but iron and percent iron saturation were low.  He has developed a microcytosis.  He has a persistently elevated white count around 12,000.   Review of  Systems As per HPI, otherwise negative  Current Medications, Allergies, Past Medical History, Past Surgical History, Family History and Social History were reviewed in [employer] record.      Objective:   Physical Exam No physical examination, virtual visit  CBC    Component Value Date/Time   WBC 12.3 (H) 07/15/2018 1347   RBC 4.68 07/15/2018 1347   HGB 11.4 (L) 07/15/2018 1347   HGB 13.2 04/20/2018 0941   HCT 36.3 (L) 07/15/2018 1347   HCT 39.6 04/20/2018 0941   PLT 282.0 07/15/2018 1347   PLT 338 04/20/2018 0941   MCV 77.7 (L) 07/15/2018 1347   MCV 86 04/20/2018 0941   MCH 28.6 04/20/2018 0941   MCH 28.9 04/14/2018 0313   MCHC 31.3 07/15/2018 1347   RDW 15.9 (H) 07/15/2018 1347   RDW 12.1 04/20/2018 0941   LYMPHSABS 1.1 07/15/2018 1347   MONOABS 0.9 07/15/2018 1347   EOSABS 0.1 07/15/2018 1347   BASOSABS 0.1 07/15/2018 1347   Iron/TIBC/Ferritin/ %Sat    Component Value Date/Time   IRON 26 (L) 07/19/2018 1230   FERRITIN 82.7 07/19/2018 1230   IRONPCTSAT 6.3 (L) 07/19/2018 1230   C. difficile PCR negative GI pathogen panel and fecal calprotectin pending     Assessment & Plan:  53 year old male with a past medical history of ileocolonic Crohn's disease, chronic eosinophilic pneumonitis/vasculitis on chronic prednisone, history of CAD status post myocardial infarction and drug-eluting stent placement in November 2019 on Plavix who is seen by virtual visit for follow-up.  1.  Ileocolonic Crohn's disease/chronic diarrhea --the question that needs to be answered is whether his diarrhea is related to active Crohn's disease or not.  It could be related to an irritable bowel type diarrhea, medication induced diarrhea or even bacterial overgrowth.  We are waiting on fecal calprotectin which should help answer the question as to whether this is an inflammatory diarrhea, and if so we would target treatment.  I have recommended the following: --He will  have Entyvio infusion today, I will follow-up Entyvio level and antibody result --He will continue prednisone 20 mg daily for now --We may consider adding back immunomodulator therapy depending on results --Awaiting fecal calprotectin and GI pathogen panel --It is telling that in January he did not have evidence for active colitis on colonoscopy though specific biopsies were not performed for this reason due to bleeding at that time --Pantoprazole may be contributing to his diarrhea, reducing the dose today to 40 mg once daily  2.  GERD --no symptoms of this.  Reducing pantoprazole to 40 mg once a day with hopes that this will improve loose stools  3.  Mild anemia with microcytosis/mixed iron studies --given bleeding that occurred 3 months ago, he very likely has an element of iron deficiency anemia likely combined with anemia of chronic disease. --Begin ferrous sulfate 325 mg once daily.  Repeat blood counts and iron studies in about 8  weeks.  I made him aware that stools would be dark/black with oral iron.  If he cannot tolerate oral iron he is asked to notify me.  He has had IV iron in the past  4.  Eosinophilic pneumonitis --he has rheumatology follow-up at [facility] soon.  Prednisone dosing is primarily related to this issue as opposed issue #1.  5.  CAD with PCI --he has a follow-up with Dr. Tamala Julian soon and the patient and his wife state that he likely will be discontinuing Plavix therapy after this visit.  We will follow-up after this visit.  Repeat virtual visit in about 4 weeks Further recommendations after review of lab and stool study which remain pending  40 minutes spent today. Greater than 50% was spent in counseling and coordination of care with the patient

## 2018-07-21 NOTE — Patient Instructions (Addendum)
Continue prednisone 20 mg daily for now.  We may consider adding back immunomodulator therapy depending on results  We will await fecal calprotectin and GI pathogen panel (stool studies)  Pantoprazole may be contributing to your diarrhea. Please reduce your dose to 40 mg once daily.  Please purchase the following medications over the counter and take as directed: Ferrous Sulfate (iron supplement) 325 mg daily-- please note, stools can be dark/black with oral iron.  Your provider has requested that you go to the basement level 8 weeks after starting your ferrous sulfate. Orders will already be in our computer system. Press "B" on the elevator. The lab is located at the first door on the left as you exit the elevator. (as of right now, lab hours are 8-4, M-F)  Please follow up with Dr [name] on [place] phone visit on Thursday, 08/19/18 at 11:00 am.

## 2018-07-21 NOTE — Addendum Note (Signed)
Addended by: Larina Bras on: 07/21/2018 03:13 PM   Modules accepted: Orders

## 2018-07-21 NOTE — Telephone Encounter (Signed)
Prednisone Last filled:  12/05/17, #180 Last OV:  04/26/18, f/ Next OV:  08/31/18, CPE

## 2018-07-22 ENCOUNTER — Other Ambulatory Visit: Payer: Self-pay

## 2018-07-22 MED ORDER — PREDNISONE 10 MG PO TABS: ORAL_TABLET | ORAL | 0 refills | Status: DC

## 2018-07-22 NOTE — Telephone Encounter (Signed)
sending this to Rosanne Sack, see my recent result note

## 2018-07-22 NOTE — Telephone Encounter (Signed)
Will forward to GI.

## 2018-07-23 ENCOUNTER — Ambulatory Visit (HOSPITAL_COMMUNITY): Payer: BLUE CROSS/BLUE SHIELD

## 2018-07-23 ENCOUNTER — Encounter (HOSPITAL_COMMUNITY): Payer: BLUE CROSS/BLUE SHIELD

## 2018-07-23 LAB — CALPROTECTIN: Calprotectin: 2580 mcg/g — ABNORMAL HIGH

## 2018-07-23 LAB — GASTROINTESTINAL PATHOGEN PANEL PCR
C. difficile Tox A/B, PCR: NOT DETECTED
Campylobacter, PCR: NOT DETECTED
Cryptosporidium, PCR: NOT DETECTED
E coli (ETEC) LT/ST PCR: NOT DETECTED
E coli (STEC) stx1/stx2, PCR: NOT DETECTED
E coli 0157, PCR: NOT DETECTED
Giardia lamblia, PCR: NOT DETECTED
Norovirus, PCR: NOT DETECTED
Rotavirus A, PCR: NOT DETECTED
Salmonella, PCR: NOT DETECTED
Shigella, PCR: NOT DETECTED

## 2018-07-23 LAB — CLOSTRIDIUM DIFFICILE TOXIN B, QUALITATIVE, REAL-TIME PCR: Toxigenic C. Difficile by PCR: NOT DETECTED

## 2018-07-26 ENCOUNTER — Ambulatory Visit (HOSPITAL_COMMUNITY): Payer: BLUE CROSS/BLUE SHIELD

## 2018-07-26 ENCOUNTER — Encounter (HOSPITAL_COMMUNITY): Payer: BLUE CROSS/BLUE SHIELD

## 2018-07-27 ENCOUNTER — Telehealth: Payer: Self-pay | Admitting: *Deleted

## 2018-07-27 NOTE — Telephone Encounter (Signed)
-----   Message from Loren Racer, LPN sent at 8/48/3507 12:23 PM EDT -----  ----- Message ----- From: Emmaline Life, RN Sent: 07/22/2018   8:04 AM EDT To: Loren Racer, LPN   ----- Message ----- From: Lauralee Evener, CMA Sent: 07/20/2018  12:29 PM EDT To: Emmaline Life, RN  You will need to contact the sleep lab. They are handled their own schedule.[phone]. I just precert. ----- Message ----- From: Emmaline Life, RN Sent: 07/19/2018  11:14 AM EDT To: Loren Racer, LPN, [facility] Sleep Studies  Dr. Thompson Caul patient had his sleep study cancelled at beginning of March due to Covid. He asked me just to make certain he is on the recall list for a future appointment.  Thank you, Sharyn Lull

## 2018-07-27 NOTE — Telephone Encounter (Signed)
Provider [facility] called pt to reschedule due to COVID-19. Pt stated he is scheduled to have back surgery and cannot reschedule at this time. Call back in May.

## 2018-07-28 ENCOUNTER — Ambulatory Visit (HOSPITAL_COMMUNITY): Payer: BLUE CROSS/BLUE SHIELD

## 2018-07-28 ENCOUNTER — Encounter (HOSPITAL_COMMUNITY): Payer: BLUE CROSS/BLUE SHIELD

## 2018-07-28 DIAGNOSIS — D721 Eosinophilia: Secondary | ICD-10-CM | POA: Diagnosis not present

## 2018-07-28 DIAGNOSIS — J069 Acute upper respiratory infection, unspecified: Secondary | ICD-10-CM | POA: Diagnosis not present

## 2018-07-30 ENCOUNTER — Ambulatory Visit (HOSPITAL_COMMUNITY): Payer: BLUE CROSS/BLUE SHIELD

## 2018-07-30 ENCOUNTER — Encounter (HOSPITAL_COMMUNITY): Payer: BLUE CROSS/BLUE SHIELD

## 2018-07-30 NOTE — Pre-Procedure Instructions (Signed)
[facility], [place] [address] Phone: [phone] Fax: [place], [place] [address] [place] [address] Phone: [phone] Fax: [phone]      Your procedure is scheduled on Monday, May 11th.  Report to [facility] Main Entrance "A" Then check in at Admitting at 5:30 A.M.  Call this number if you have problems the morning of surgery:  [phone]  Call [phone] if you have any questions prior to your surgery date Monday-Friday 8am-4pm    Remember:  Do not eat or drink after midnight.    Take these medicines the morning of surgery with A SIP OF WATER  carvedilol (COREG)  gabapentin (NEURONTIN)  Icosapent Ethyl (VASCEPA) pantoprazole (PROTONIX) predniSONE (DELTASONE) tamsulosin (FLOMAX)  As needed:acetaminophen (TYLENOL) for mild pain, HYDROcodone-acetaminophen (NORCO/VICODIN) for moderate pain,  diphenoxylate-atropine (LOMOTIL) for diarrhea.   Follow your surgeon's instructions on when to stop/resume Plavix.  If no instructions were given by your surgeon then you will need to call the office to get those instructions.    As of today, STOP taking any Aspirin (unless otherwise instructed by your surgeon), Aleve, Naproxen, Ibuprofen, Motrin, Advil, Goody's, BC's, all herbal medications, fish oil, and all vitamins.    The Morning of Surgery  Do not wear jewelry.  Do not wear lotions, powders, or colognes, or deodorant  Do not shave 48 hours prior to surgery.  Men may shave face and neck.  Do not bring valuables to the hospital.  [facility] is not responsible for any belongings or valuables.  If you are a smoker, DO NOT Smoke 24 hours prior to surgery  REMEMBER THAT YOU MUST SOMEONE TO TRANSPORT YOU HOME AFTER SURGERY IF YOU ARE DISCHARGED THE SAME DAY OF SURGERY  YOU WILL ALSO NEED SOMEONE TO STAY WITH YOU FOR 24  HOURS AFTER SURGERY IF YOU ARE DISCHARGED THE SAME DAY OF SURGERY   Contacts, glasses, hearing aids, dentures or bridgework may not be worn into surgery.   If you wear a CPAP at night please bring your mask, tubing, and machine the morning of surgery   Leave your suitcase in the car.  After surgery it may be brought to your room.  For patients admitted to the hospital, discharge time will be determined by your treatment team.  Patients discharged the day of surgery will not be allowed to drive home.    Special instructions:   [facility]- Preparing For Surgery  Before surgery, you can play an important role. Because skin is not sterile, your skin needs to be as free of germs as possible. You can reduce the number of germs on your skin by washing with CHG (chlorahexidine gluconate) Soap before surgery.  CHG is an antiseptic cleaner which kills germs and bonds with the skin to continue killing germs even after washing.    Oral Hygiene is also important to reduce your risk of infection.  Remember - BRUSH YOUR TEETH THE MORNING OF SURGERY WITH YOUR REGULAR TOOTHPASTE  Please do not use if you have an allergy to CHG or antibacterial soaps. If your skin becomes reddened/irritated stop using the CHG.  Do not shave (including legs and underarms) for at least 48 hours prior to first CHG shower. It is OK to shave your face.  Please follow these instructions carefully.   1. Shower the NIGHT BEFORE SURGERY and the MORNING OF SURGERY  with CHG Soap.   2. If you chose to wash your hair, wash your hair first as usual with your normal shampoo.  3. After you shampoo, rinse your hair and body thoroughly to remove the shampoo.  4. Use CHG as you would any other liquid soap. You can apply CHG directly to the skin and wash gently with a scrungie or a clean washcloth.   5. Apply the CHG Soap to your body ONLY FROM THE NECK DOWN.  Do not use on open wounds or open sores. Avoid contact with your eyes, ears,  mouth and genitals (private parts). Wash Face and genitals (private parts)  with your normal soap.   6. Wash thoroughly, paying special attention to the area where your surgery will be performed.  7. Thoroughly rinse your body with warm water from the neck down.  8. DO NOT shower/wash with your normal soap after using and rinsing off the CHG Soap.  9. Pat yourself dry with a CLEAN TOWEL.  10. Wear CLEAN PAJAMAS to bed the night before surgery, wear comfortable clothes the morning of surgery  11. Place CLEAN SHEETS on your bed the night of your first shower and DO NOT SLEEP WITH PETS.    Day of Surgery:  Do not apply any deodorants/lotions.  Please wear clean clothes to the hospital/surgery center.   Remember to brush your teeth WITH YOUR REGULAR TOOTHPASTE.   Please read over the following fact sheets that you were given.

## 2018-08-02 ENCOUNTER — Ambulatory Visit (HOSPITAL_COMMUNITY): Payer: BLUE CROSS/BLUE SHIELD

## 2018-08-02 ENCOUNTER — Encounter (HOSPITAL_COMMUNITY): Payer: BLUE CROSS/BLUE SHIELD

## 2018-08-02 ENCOUNTER — Encounter (HOSPITAL_COMMUNITY): Payer: Self-pay

## 2018-08-02 ENCOUNTER — Other Ambulatory Visit: Payer: Self-pay

## 2018-08-02 ENCOUNTER — Encounter (HOSPITAL_COMMUNITY)
Admission: RE | Admit: 2018-08-02 | Discharge: 2018-08-02 | Disposition: A | Discharge status: Home / Self Care | Payer: BLUE CROSS/BLUE SHIELD | Source: Ambulatory Visit | Attending: Neurological Surgery | Admitting: Neurological Surgery

## 2018-08-02 DIAGNOSIS — I080 Rheumatic disorders of both mitral and aortic valves: Secondary | ICD-10-CM | POA: Insufficient documentation

## 2018-08-02 DIAGNOSIS — K219 Gastro-esophageal reflux disease without esophagitis: Secondary | ICD-10-CM | POA: Insufficient documentation

## 2018-08-02 DIAGNOSIS — Z79899 Other long term (current) drug therapy: Secondary | ICD-10-CM | POA: Diagnosis not present

## 2018-08-02 DIAGNOSIS — I5022 Chronic systolic (congestive) heart failure: Secondary | ICD-10-CM | POA: Insufficient documentation

## 2018-08-02 DIAGNOSIS — Z87891 Personal history of nicotine dependence: Secondary | ICD-10-CM | POA: Insufficient documentation

## 2018-08-02 DIAGNOSIS — Z955 Presence of coronary angioplasty implant and graft: Secondary | ICD-10-CM | POA: Insufficient documentation

## 2018-08-02 DIAGNOSIS — I110 Hypertensive heart disease with heart failure: Secondary | ICD-10-CM | POA: Diagnosis not present

## 2018-08-02 DIAGNOSIS — Z01812 Encounter for preprocedural laboratory examination: Secondary | ICD-10-CM | POA: Insufficient documentation

## 2018-08-02 DIAGNOSIS — I252 Old myocardial infarction: Secondary | ICD-10-CM | POA: Diagnosis not present

## 2018-08-02 DIAGNOSIS — E785 Hyperlipidemia, unspecified: Secondary | ICD-10-CM | POA: Diagnosis not present

## 2018-08-02 DIAGNOSIS — Z7902 Long term (current) use of antithrombotics/antiplatelets: Secondary | ICD-10-CM | POA: Insufficient documentation

## 2018-08-02 DIAGNOSIS — I255 Ischemic cardiomyopathy: Secondary | ICD-10-CM | POA: Diagnosis not present

## 2018-08-02 DIAGNOSIS — Z7901 Long term (current) use of anticoagulants: Secondary | ICD-10-CM | POA: Diagnosis not present

## 2018-08-02 DIAGNOSIS — K5090 Crohn's disease, unspecified, without complications: Secondary | ICD-10-CM | POA: Insufficient documentation

## 2018-08-02 DIAGNOSIS — I2510 Atherosclerotic heart disease of native coronary artery without angina pectoris: Secondary | ICD-10-CM | POA: Diagnosis not present

## 2018-08-02 HISTORY — DX: Acute myocardial infarction, unspecified: I21.9

## 2018-08-02 LAB — BASIC METABOLIC PANEL
Anion gap: 8 (ref 5–15)
BUN: 16 mg/dL (ref 6–20)
CO2: 26 mmol/L (ref 22–32)
Calcium: 8.6 mg/dL — ABNORMAL LOW (ref 8.9–10.3)
Chloride: 104 mmol/L (ref 98–111)
Creatinine, Ser: 1.39 mg/dL — ABNORMAL HIGH (ref 0.61–1.24)
GFR calc Af Amer: >60 mL/min (ref >60)
GFR calc non Af Amer: 58 mL/min — ABNORMAL LOW (ref >60)
Glucose, Bld: 129 mg/dL — ABNORMAL HIGH (ref 70–99)
Potassium: 4.3 mmol/L (ref 3.5–5.1)
Sodium: 138 mmol/L (ref 135–145)

## 2018-08-02 LAB — TYPE AND SCREEN
ABO/RH(D): O POS
Antibody Screen: NEGATIVE

## 2018-08-02 LAB — CBC
HCT: 39.5 % (ref 39.0–52.0)
Hemoglobin: 11.7 g/dL — ABNORMAL LOW (ref 13.0–17.0)
MCH: 24.1 pg — ABNORMAL LOW (ref 26.0–34.0)
MCHC: 29.6 g/dL — ABNORMAL LOW (ref 30.0–36.0)
MCV: 81.4 fL (ref 80.0–100.0)
Platelets: 359 10*3/uL (ref 150–400)
RBC: 4.85 MIL/uL (ref 4.22–5.81)
RDW: 17.3 % — ABNORMAL HIGH (ref 11.5–15.5)
WBC: 9.3 10*3/uL (ref 4.0–10.5)
nRBC: 0 % (ref 0.0–0.2)

## 2018-08-02 LAB — SURGICAL PCR SCREEN
MRSA, PCR: NEGATIVE
Staphylococcus aureus: NEGATIVE

## 2018-08-02 NOTE — Progress Notes (Signed)
PCP - Dr. Leo Grosser Cardiologist - Dr. Tamala Julian  Chest x-ray - n/a EKG - 03/18/2018 Stress Test - patient denies ECHO - 05/27/2018 Cardiac Cath - 02/21/2018  Sleep Study - patient denies CPAP -   Fasting Blood Sugar - n/a Checks Blood Sugar _____ times a day  Blood Thinner Instructions: patient's last dose of Coumadin to be 08/04/2018; will need PT-INR DOS; patient's wife to call to get instructions from Dr. Ellene Route when to stop plavix Aspirin Instructions:n/a  Anesthesia review: yes, hx of recent MI, s/p intervention 02/22/2019  Patient denies shortness of breath, fever, cough and chest pain at PAT appointment   Patient verbalized understanding of instructions that were given to them at the PAT appointment. Patient was also instructed that they will need to review over the PAT instructions again at home before surgery.

## 2018-08-03 NOTE — Progress Notes (Addendum)
Anesthesia Chart Review:  Case:  [id number] Date/Time:  08/09/18 0715   Procedure:  Lumbar 3 Laminectomy, excision of intradural tumor with operating microscope (N/A ) - Lumbar 3 Laminectomy, excision of intradural tumor with operating microscope   Anesthesia type:  General   Pre-op diagnosis:  Intradural tumor   Location:  [facility] OR ROOM 20 / [facility]   Surgeon:  Kristeen Miss, MD      DISCUSSION: Patient is a 53 year old male scheduled for the above procedure.  History includes former smoker (quit 02/20/18), Crohn's disease, HTN, CAD (anterior STEMI with occluded proximal LAD and anterior wall akinesis with EF 30-35% 02/20/18, s/p DES LAD; re-cath 02/22/28 for recurrent chest pain/ST elevation with stable anatomy, treated for post-MI pericarditis), LV apical thrombus (02/22/18, s/p warfarin, discontinued 0/4/54), chronic systolic CHF (repeat EF 09-81% 05/2018), ischemic cardiomyopathy, GI bleed (03/08/18, 04/07/18: inflammatory rectal polyp on colonoscopy 04/09/18), seizure (1995), schwannoma (left axillary, s/p excision, 2007), GERD, spinal cord stimulator implant (2012). - He was evaluated (telemedicine) by [facility] pulmonologists Larey Days, MD and Skipper Cliche, MD on 07/28/18 for desire to taper steroids. According to note, he was treated for organizing pneumonia in 2014 including prednisone and given IV steroids in 2016 for possible eosinophilic granulomatosis. Over the years, he has seen several pulmonologists including with [place], [place], and [facility]. Polyangiitis (EGPA) also considered although no recent evidence of vasculitis on 2016/2017 CT scans. Underlying asthma also a consideration. Dr. Silas Flood planned to refer patient to ENT for sinus evaluation, repeat CT and ANCA when able, check PFTs, and contact rheumatology and GI first before making recommendations if patient could begin taper. (GI notes indicate he is more on prednisone for eosinophilic pneumonia history than for Crohn's  disease.)  Patient had cardiology telemedicine visit with Dr. Tamala Julian on 07/19/18. He was aware of surgery scheduled 08/09/18. Patient felt stable from a cardiac standpoint, although "high risk for future vascular events. Risk factor modification efforts are underway. He is relatively immobile due to severe back issues that will hopefully improve with upcoming surgery.Marland KitchenMarland KitchenThe patient has been cleared to proceed. He will discontinue clopidogrel and apixaban prior to the procedure, resuming clopidogrel as soon as possible and safe after the procedure." Aspirin was discontinued shortly after starting warfarin. He has actually been on clopidogrel and warfarin (which was in Dr. Thompson Caul original clearance note from 07/05/18) not clopidogrel and apixaban. [place] pharmacist notes that warfarin can be permanantly stopped after 08/03/18 dose per Dr. Tamala Julian.  A sleep study has been ordered with plans to do following recovery from surgery and once COVID restrictions lifted.  Nikki at Dr. Clarice Pole office notified that as of 08/02/18, patient was planning to contact their office to clarify Plavix instructions. She will follow-up with patient. Per Dr. Tamala Julian, okay to hold 5-7 days before surgery.   He is on prednisone 20 mg daily, so consider stress dose steroids per anesthesiologist. STEMI 01/2018, but cardiology on board with surgery plans. Needs PT/INR on the day of surgery. He denied SOB, chest pain, cough, and fever at PAT RN visit. Anesthesia team to evaluate on the day of surgery.    VS: BP (!) 129/57   Pulse (!) 58   Temp 36.8 C   Resp 20   Ht 5' 7"  (1.702 m)   Wt 77 kg   SpO2 98%   BMI 26.58 kg/m    PROVIDERS: [name], MD is PCP  - Daneen Schick, MD is cardiologist - Zenovia Jarred, MD is GI. Last visit  07/22/18. Entyvio infusion done. No evident of active colitis on colonoscopy in January. He notes prednisone therapy is primarily related to eosinophilic pneumonitis history and not Crohn's. Skipper Cliche, MD is pulmonologist [facility]) - Berenda Morale, MD is rheumatologist ([place]). Recommended slow steroid ta;er and reassessment of any flares of disease, but referred to pulmonology for help with possible EGPA diagnosis.   LABS: Labs reviewed: Acceptable for surgery. Cr 1.3 (Cr 1.27-1.45 since at least 02/2018). (all labs ordered are listed, but only abnormal results are displayed)  Labs Reviewed  CBC - Abnormal; Notable for the following components:      Result Value   Hemoglobin 11.7 (*)    MCH 24.1 (*)    MCHC 29.6 (*)    RDW 17.3 (*)    All other components within normal limits  BASIC METABOLIC PANEL - Abnormal; Notable for the following components:   Glucose, Bld 129 (*)    Creatinine, Ser 1.39 (*)    Calcium 8.6 (*)    GFR calc non Af Amer 58 (*)    All other components within normal limits  SURGICAL PCR SCREEN  TYPE AND SCREEN    IMAGES: CT L-spine 05/05/18: IMPRESSION: - 14 mm round intradural mass lesion at inferior L3 with relative block to the passage of contrast. This is consistent with a nerve root tumor. - 1 or 2 mm nerve root nodule at the level of inferior L4 consistent with a small nerve root tumor. - Left S1 nerve root tumor filling the root sleeve. Dorsal pseudomeningocele at that level related to previous surgical procedure.  1V CXR 02/20/18: FINDINGS: Upper normal heart size. Lungs appear clear. No pleural effusion. Dorsal column stimulator projects over the upper lumbar region. IMPRESSION: No active disease.   EKG: 03/18/18 ([facility]): NSR By atrial enlargement Rightward axis Septal infarct, age undetermined ST and T wave abnormality, consider anterior lateral ischemia.   CV: Echo 05/27/18: IMPRESSIONS  1. The left ventricle has moderately reduced systolic function, with an ejection fraction of 35-40%. The cavity size was normal. There is mildly increased left ventricular wall thickness. Left ventricular  diastolic Doppler parameters are consistent with  impaired relaxation Indeterminent filling pressures The E/e' is 8-15.  2. Severe akinesis of the left ventricular anterior wall, anteroseptal wall and apical segment.  3. The right ventricle has normal systolic function. The cavity was normal. There is no increase in right ventricular wall thickness.  4. The mitral valve is degenerative. Mild thickening of the mitral valve leaflet. Mild calcification of the mitral valve leaflet. Mitral valve regurgitation is mild to moderate by color flow Doppler.  5. The aortic valve is tricuspid Mild thickening of the aortic valve Mild calcification of the aortic valve. Aortic valve regurgitation is mild by color flow Doppler.  6. The aortic root and ascending aorta are normal in size and structure.  7. When compared to the prior study: No change compared to prior study in 02/2018. SUMMARY LVEF 35-40%, LAD territory infarct, grade 1 DD, indeterminate LV filling pressure, normal biatrial size, trace to mild AI, mild to moderate MR, trivial TR, normal IVC, no mural thrombus by Definity Contrast.  Cardiac cath 02/21/18 (for recurrent chest pain with more diffuse ST elevation):  Anatomy is stable when compared to immediate post PCI results.  There is TIMI grade III flow in the LAD and circumflex.  The proximal LAD stent is widely patent and as before there is proximal plaque obstructing the vessel up to  40%. RECOMMENDATIONS:  We will give 40 mg of prednisone orally today which is twice his typical dose.  Start colchicine 0.6 mg twice daily.  2D Doppler echocardiogram is already been ordered.  We will give a stronger analgesic and also provide benzodiazepine therapy as the patient is very concerned that he cannot sleep.  If the patient develops progression in the disease proximal to the stented segment, coronary artery bypass grafting would be his best treatment option. -Recommend uninterrupted dual  antiplatelet therapy with Aspirin 75m daily and Ticagrelor 916mtwice daily for a minimum of 12 months (ACS - Class I recommendation).  Cardiac cath 02/20/18:  Anterior ST elevation myocardial infarction due to proximal occlusion of the LAD.  Successful PCI and stent reducing 100% stenosis with 0 TIMI flow to 0% with TIMI grade III flow using a 28 x 3.0 mm Synergy drug-eluting stent.  Postdilatation was not performed.  Deployment pressure 12 atm.  Mild distal left main disease, 20%  95% ostial proximal small to moderate size ramus intermedius.  Moderate to moderately severe circumflex disease with ostial 70 to 80% stenosis, dominant obtuse marginal with ostial 70% stenosis, and proximal 50 to 60% circumflex.  RCA is dominant with eccentric 70% stenosis.  Anterior and anteroapical akinesis with EF 35%.  LVEDP (24 mmHg)  is elevated and consistent with acute systolic heart failure. RECOMMENDATIONS:  Guideline directed therapy for acute ischemic systolic heart failure: Beta-blocker, ARB therapy.  Diuretic therapy as needed if congestion.  Aggressive secondary risk factor modification using high intensity statin therapy and encouraging the patient to discontinue smoking.  Target blood pressure 130/80 mmHg or less.  Closely review the patient's medical regimen for Crohn's / systemic vasculitis/hypereosinophilic syndrome and be certain to use all important medications.  We will continue prednisone at 20 mg/day but watch for evidence of hypo-adrenal ism and potentially consider a dose or 2 of stress level glucocorticoid therapy if needed.  Cangrelor will be discontinued 2 hours after the Brilinta slurry was swallowed.  Moderate to moderately severe residual coronary disease in the circumflex, ramus, and RCA will be managed with medical therapy.  BLE Arterial [place]0/21/19: IMPRESSION: Normal examination. No evidence of hemodynamically significant peripheral arterial disease.   Past  Medical History:  Diagnosis Date  . BPH (benign prostatic hypertrophy)   . CAD (coronary artery disease)    a. anterior STEMI 01/2018 -  proximal occlusion of LAD, treated with DES. Cath also showed 20% distal LM, 95% ostial-prox small-moderate ramus, 70-80% ostial Cx, 70% dominant ostial OM, 50-60% prox Cx, 70% RCA. EF 35% by cath with LVEDP 2480m. Med rx for residual disease. Course complicated by post MI pericarditis and LV thrombus.  . Chronic systolic (congestive) heart failure ([facility]CC[place] Sl[place] . Colitis   . Colon polyp    inflammatory  . Crohn disease ([facility]992   history uveitis, involvement of intestines and lungs  . Diverticulosis   . Eosinophilic granuloma ([facility]CCC[facility] . Essential hypertension   . GERD (gastroesophageal reflux disease)   . GI bleeding   . History of chicken pox   . History of gastroesophageal reflux (GERD)   . History of seizure 1995   grand mal x1, completed 6 yrs dilantin. no seizures since  . Hyperlipidemia   . Internal hemorrhoids   . Ischemic cardiomyopathy   . LV (left ventricular) mural thrombus following MI (HCCMower1/2019  . Myocardial infarction ([facility] . Osteoporosis 11/2015   DEXA T -2.9  . Schwannoma 2007  L axilla s/p surgery  . Seizures ([place])    last seizure 1995 and only x 1 seizure-   . Ulnar neuropathy    h/o this from L arm schwannoma    Past Surgical History:  Procedure Laterality Date  . APPENDECTOMY  2000  . BACK SURGERY  2011   lumbar  . CARDIAC CATHETERIZATION    . CARDIOVASCULAR STRESS TEST  01/2015   low risk study  . CHOLECYSTECTOMY  2000  . COLONOSCOPY  08/2014   f/u crohn's, 4 inflammatory polyps, diverticulosis, improved, rpt 2 yrs ([place])  . COLONOSCOPY WITH PROPOFOL N/A 04/09/2018   inflammatory polyp, f/u left to primary GI - [facility] (Danis, Kirke Corin, MD)  . CORONARY ANGIOGRAPHY N/A 02/21/2018   Procedure: CORONARY ANGIOGRAPHY;  Surgeon: Belva Crome, MD;  Location: [place] CV LAB;  Service: Cardiovascular;   Laterality: N/A;  . CORONARY/GRAFT ACUTE MI REVASCULARIZATION N/A 02/20/2018   Procedure: Coronary/Graft Acute MI Revascularization;  Surgeon: Belva Crome, MD;  Location: [place] CV LAB;  Service: Cardiovascular;  Laterality: N/A;  . ESOPHAGOGASTRODUODENOSCOPY  07/2014   h/o EE resolved, focal reflux esophagitis, chronic active gastritis  . extremity surgery Left    ulnar nerve repair after schwannoma removal  . FLEXIBLE SIGMOIDOSCOPY N/A 04/14/2018   Procedure: FLEXIBLE SIGMOIDOSCOPY;  Surgeon: Milus Banister, MD;  Location: [facility] ENDOSCOPY;  Service: Endoscopy;  Laterality: N/A;  . INGUINAL HERNIA REPAIR Bilateral 2017  . LEFT HEART CATH AND CORONARY ANGIOGRAPHY N/A 02/20/2018   Procedure: LEFT HEART CATH AND CORONARY ANGIOGRAPHY;  Surgeon: Belva Crome, MD;  Location: [place] CV LAB;  Service: Cardiovascular;  Laterality: N/A;  . LUMBAR SPINE SURGERY  2011  . POLYPECTOMY  04/09/2018   Procedure: POLYPECTOMY;  Surgeon: Doran Stabler, MD;  Location: [facility] ENDOSCOPY;  Service: Gastroenterology;;  . SPINAL CORD STIMULATOR IMPLANT  2012   x2 (Dr Berton Mount)  . SUBMUCOSAL INJECTION  04/09/2018   Procedure: SUBMUCOSAL INJECTION;  Surgeon: Doran Stabler, MD;  Location: [facility] ENDOSCOPY;  Service: Gastroenterology;;  . TONSILLECTOMY  1996  . TUMOR REMOVAL Left 2007   schwannoma from L armpit  . UPPER GASTROINTESTINAL ENDOSCOPY      MEDICATIONS: . warfarin (COUMADIN) 5 MG tablet  . acetaminophen (TYLENOL) 325 MG tablet  . alendronate (FOSAMAX) 70 MG tablet  . atorvastatin (LIPITOR) 80 MG tablet  . Calcium Carbonate-Vit D-Min (CALCIUM 1200 PO)  . carvedilol (COREG) 6.25 MG tablet  . clopidogrel (PLAVIX) 75 MG tablet  . diphenoxylate-atropine (LOMOTIL) 2.5-0.025 MG tablet  . ferrous sulfate 325 (65 FE) MG tablet  . furosemide (LASIX) 20 MG tablet  . gabapentin (NEURONTIN) 300 MG capsule  . HYDROcodone-acetaminophen (NORCO/VICODIN) 5-325 MG tablet  . hydrOXYzine (ATARAX/VISTARIL)  25 MG tablet  . Icosapent Ethyl (VASCEPA) 1 g CAPS  . losartan (COZAAR) 25 MG tablet  . Multiple Vitamin (MULTIVITAMIN) capsule  . nitroGLYCERIN (NITROSTAT) 0.4 MG SL tablet  . pantoprazole (PROTONIX) 40 MG tablet  . predniSONE (DELTASONE) 10 MG tablet  . spironolactone (ALDACTONE) 25 MG tablet  . tadalafil (CIALIS) 5 MG tablet  . tamsulosin (FLOMAX) 0.4 MG CAPS capsule  . vedolizumab (ENTYVIO) 300 MG injection   No current facility-administered medications for this encounter.    . ondansetron (ZOFRAN) 4 mg in sodium chloride 0.9 % 50 mL IVPB    Myra Gianotti, PA-C Surgical Short Stay/Anesthesiology [facility] Phone [phone] [facility] Phone [phone] 08/03/2018 1:15 PM

## 2018-08-03 NOTE — Anesthesia Preprocedure Evaluation (Addendum)
Anesthesia Evaluation  Patient identified by MRN, date of birth, ID band Patient awake    Reviewed: Allergy & Precautions, H&P , NPO status , Patient's Chart, lab work & pertinent test results, reviewed documented beta blocker date and time   Airway Mallampati: II  TM Distance: >3 FB Neck ROM: Full    Dental no notable dental hx. (+) Teeth Intact, Dental Advisory Given   Pulmonary sleep apnea , former smoker,    Pulmonary exam normal breath sounds clear to auscultation       Cardiovascular Exercise Tolerance: Good hypertension, Pt. on medications and Pt. on home beta blockers + CAD, + Past MI, + Cardiac Stents and +CHF   Rhythm:Regular Rate:Normal     Neuro/Psych Seizures -, Well Controlled,  negative psych ROS   GI/Hepatic Neg liver ROS, GERD  Medicated and Controlled,  Endo/Other  negative endocrine ROS  Renal/GU negative Renal ROS  negative genitourinary   Musculoskeletal   Abdominal   Peds  Hematology negative hematology ROS (+)   Anesthesia Other Findings   Reproductive/Obstetrics negative OB ROS                           Anesthesia Physical Anesthesia Plan  ASA: III  Anesthesia Plan: General   Post-op Pain Management:    Induction: Intravenous  PONV Risk Score and Plan: 3 and Dexamethasone, Ondansetron and Midazolam  Airway Management Planned: Oral ETT  Additional Equipment: Arterial line  Intra-op Plan:   Post-operative Plan: Extubation in OR  Informed Consent: I have reviewed the patients History and Physical, chart, labs and discussed the procedure including the risks, benefits and alternatives for the proposed anesthesia with the patient or authorized representative who has indicated his/her understanding and acceptance.     Dental advisory given  Plan Discussed with: CRNA  Anesthesia Plan Comments: (See PAT note written 08/03/2018 by Myra Gianotti, PA-C.  Anterior STEMI 01/2018. EF 35-40%. )      Anesthesia Quick Evaluation

## 2018-08-04 ENCOUNTER — Ambulatory Visit (HOSPITAL_COMMUNITY): Payer: BLUE CROSS/BLUE SHIELD

## 2018-08-04 ENCOUNTER — Encounter (HOSPITAL_COMMUNITY): Payer: BLUE CROSS/BLUE SHIELD

## 2018-08-04 NOTE — Progress Notes (Signed)
Patient will arrive for Covid 19 testing on 08/05/18 between 11-2 at [place] location.    Patient also notified of surgery time change and will arrive to short stay on 08/09/18 at 0830.  Informed patient that we will call if there were any other schedule changes.

## 2018-08-05 ENCOUNTER — Other Ambulatory Visit: Payer: Self-pay

## 2018-08-05 ENCOUNTER — Other Ambulatory Visit (HOSPITAL_COMMUNITY)
Admission: RE | Admit: 2018-08-05 | Discharge: 2018-08-05 | Disposition: A | Discharge status: Home / Self Care | Payer: BLUE CROSS/BLUE SHIELD | Source: Ambulatory Visit | Attending: Neurological Surgery | Admitting: Neurological Surgery

## 2018-08-05 DIAGNOSIS — Z1159 Encounter for screening for other viral diseases: Secondary | ICD-10-CM | POA: Insufficient documentation

## 2018-08-06 ENCOUNTER — Encounter (HOSPITAL_COMMUNITY): Payer: BLUE CROSS/BLUE SHIELD

## 2018-08-06 ENCOUNTER — Ambulatory Visit (HOSPITAL_COMMUNITY): Payer: BLUE CROSS/BLUE SHIELD

## 2018-08-06 LAB — NOVEL CORONAVIRUS, NAA (HOSP ORDER, SEND-OUT TO REF LAB; TAT 18-24 HRS): SARS-CoV-2, NAA: NOT DETECTED

## 2018-08-06 MED ORDER — VANCOMYCIN HCL IN DEXTROSE 1-5 GM/200ML-% IV SOLN
1000.0000 mg | INTRAVENOUS | Status: AC
  Administered 2018-08-09: 1000 mg via INTRAVENOUS
  Filled 2018-08-06: qty 200

## 2018-08-09 ENCOUNTER — Encounter (HOSPITAL_COMMUNITY)
Admission: RE | Disposition: A | Discharge status: Home / Self Care | Payer: Self-pay | Source: Home / Self Care | Attending: Neurological Surgery

## 2018-08-09 ENCOUNTER — Other Ambulatory Visit: Payer: Self-pay

## 2018-08-09 ENCOUNTER — Inpatient Hospital Stay (HOSPITAL_COMMUNITY)
Admission: RE | Admit: 2018-08-09 | Discharge: 2018-08-10 | DRG: 519 | Disposition: A | Discharge status: Home / Self Care | Payer: BLUE CROSS/BLUE SHIELD | Attending: Neurological Surgery | Admitting: Neurological Surgery

## 2018-08-09 ENCOUNTER — Ambulatory Visit (HOSPITAL_COMMUNITY): Payer: BLUE CROSS/BLUE SHIELD

## 2018-08-09 ENCOUNTER — Telehealth: Payer: Self-pay | Admitting: Internal Medicine

## 2018-08-09 ENCOUNTER — Inpatient Hospital Stay (HOSPITAL_COMMUNITY): Payer: BLUE CROSS/BLUE SHIELD

## 2018-08-09 ENCOUNTER — Encounter (HOSPITAL_COMMUNITY): Payer: BLUE CROSS/BLUE SHIELD

## 2018-08-09 ENCOUNTER — Inpatient Hospital Stay (HOSPITAL_COMMUNITY): Payer: BLUE CROSS/BLUE SHIELD | Admitting: Anesthesiology

## 2018-08-09 ENCOUNTER — Encounter (HOSPITAL_COMMUNITY): Payer: Self-pay

## 2018-08-09 ENCOUNTER — Inpatient Hospital Stay (HOSPITAL_COMMUNITY): Payer: BLUE CROSS/BLUE SHIELD | Admitting: Physician Assistant

## 2018-08-09 DIAGNOSIS — Z7902 Long term (current) use of antithrombotics/antiplatelets: Secondary | ICD-10-CM | POA: Diagnosis not present

## 2018-08-09 DIAGNOSIS — I5022 Chronic systolic (congestive) heart failure: Secondary | ICD-10-CM | POA: Diagnosis present

## 2018-08-09 DIAGNOSIS — Z87891 Personal history of nicotine dependence: Secondary | ICD-10-CM

## 2018-08-09 DIAGNOSIS — I2510 Atherosclerotic heart disease of native coronary artery without angina pectoris: Secondary | ICD-10-CM | POA: Diagnosis not present

## 2018-08-09 DIAGNOSIS — I255 Ischemic cardiomyopathy: Secondary | ICD-10-CM | POA: Diagnosis not present

## 2018-08-09 DIAGNOSIS — E785 Hyperlipidemia, unspecified: Secondary | ICD-10-CM | POA: Diagnosis present

## 2018-08-09 DIAGNOSIS — D3617 Benign neoplasm of peripheral nerves and autonomic nervous system of trunk, unspecified: Secondary | ICD-10-CM | POA: Diagnosis not present

## 2018-08-09 DIAGNOSIS — Z23 Encounter for immunization: Secondary | ICD-10-CM

## 2018-08-09 DIAGNOSIS — E2740 Unspecified adrenocortical insufficiency: Secondary | ICD-10-CM | POA: Diagnosis present

## 2018-08-09 DIAGNOSIS — Z8249 Family history of ischemic heart disease and other diseases of the circulatory system: Secondary | ICD-10-CM

## 2018-08-09 DIAGNOSIS — Z8349 Family history of other endocrine, nutritional and metabolic diseases: Secondary | ICD-10-CM

## 2018-08-09 DIAGNOSIS — Z981 Arthrodesis status: Secondary | ICD-10-CM | POA: Diagnosis not present

## 2018-08-09 DIAGNOSIS — Z7952 Long term (current) use of systemic steroids: Secondary | ICD-10-CM

## 2018-08-09 DIAGNOSIS — Z955 Presence of coronary angioplasty implant and graft: Secondary | ICD-10-CM | POA: Diagnosis not present

## 2018-08-09 DIAGNOSIS — Z800 Family history of malignant neoplasm of digestive organs: Secondary | ICD-10-CM | POA: Diagnosis not present

## 2018-08-09 DIAGNOSIS — I252 Old myocardial infarction: Secondary | ICD-10-CM

## 2018-08-09 DIAGNOSIS — I2102 ST elevation (STEMI) myocardial infarction involving left anterior descending coronary artery: Secondary | ICD-10-CM | POA: Diagnosis not present

## 2018-08-09 DIAGNOSIS — M810 Age-related osteoporosis without current pathological fracture: Secondary | ICD-10-CM | POA: Diagnosis present

## 2018-08-09 DIAGNOSIS — Z419 Encounter for procedure for purposes other than remedying health state, unspecified: Secondary | ICD-10-CM

## 2018-08-09 DIAGNOSIS — K5090 Crohn's disease, unspecified, without complications: Secondary | ICD-10-CM | POA: Diagnosis present

## 2018-08-09 DIAGNOSIS — Z7983 Long term (current) use of bisphosphonates: Secondary | ICD-10-CM

## 2018-08-09 DIAGNOSIS — M301 Polyarteritis with lung involvement [Churg-Strauss]: Secondary | ICD-10-CM | POA: Diagnosis not present

## 2018-08-09 DIAGNOSIS — G4730 Sleep apnea, unspecified: Secondary | ICD-10-CM | POA: Diagnosis present

## 2018-08-09 DIAGNOSIS — Z7901 Long term (current) use of anticoagulants: Secondary | ICD-10-CM

## 2018-08-09 DIAGNOSIS — K219 Gastro-esophageal reflux disease without esophagitis: Secondary | ICD-10-CM | POA: Diagnosis present

## 2018-08-09 DIAGNOSIS — D3610 Benign neoplasm of peripheral nerves and autonomic nervous system, unspecified: Secondary | ICD-10-CM | POA: Diagnosis not present

## 2018-08-09 DIAGNOSIS — D334 Benign neoplasm of spinal cord: Secondary | ICD-10-CM | POA: Diagnosis not present

## 2018-08-09 DIAGNOSIS — Z881 Allergy status to other antibiotic agents status: Secondary | ICD-10-CM

## 2018-08-09 DIAGNOSIS — I110 Hypertensive heart disease with heart failure: Secondary | ICD-10-CM | POA: Diagnosis present

## 2018-08-09 DIAGNOSIS — N400 Enlarged prostate without lower urinary tract symptoms: Secondary | ICD-10-CM | POA: Diagnosis present

## 2018-08-09 DIAGNOSIS — D497 Neoplasm of unspecified behavior of endocrine glands and other parts of nervous system: Secondary | ICD-10-CM | POA: Diagnosis not present

## 2018-08-09 DIAGNOSIS — Z888 Allergy status to other drugs, medicaments and biological substances status: Secondary | ICD-10-CM | POA: Diagnosis not present

## 2018-08-09 DIAGNOSIS — M5416 Radiculopathy, lumbar region: Secondary | ICD-10-CM | POA: Diagnosis present

## 2018-08-09 HISTORY — PX: LAMINECTOMY: SHX219

## 2018-08-09 LAB — PROTIME-INR
INR: 1.3 — ABNORMAL HIGH (ref 0.8–1.2)
Prothrombin Time: 15.6 seconds — ABNORMAL HIGH (ref 11.4–15.2)

## 2018-08-09 SURGERY — LUMBAR LAMINECTOMY FOR TUMOR
Anesthesia: General

## 2018-08-09 MED ORDER — SODIUM CHLORIDE 0.9% FLUSH
3.0000 mL | Freq: Two times a day (BID) | INTRAVENOUS | Status: DC
  Administered 2018-08-09: 3 mL via INTRAVENOUS

## 2018-08-09 MED ORDER — EPHEDRINE 5 MG/ML INJ
INTRAVENOUS | Status: AC
  Filled 2018-08-09: qty 10

## 2018-08-09 MED ORDER — ONDANSETRON HCL 4 MG/2ML IJ SOLN
INTRAMUSCULAR | Status: DC | PRN
  Administered 2018-08-09: 4 mg via INTRAVENOUS

## 2018-08-09 MED ORDER — LIDOCAINE 2% (20 MG/ML) 5 ML SYRINGE
INTRAMUSCULAR | Status: AC
  Filled 2018-08-09: qty 5

## 2018-08-09 MED ORDER — ATORVASTATIN CALCIUM 80 MG PO TABS
80.0000 mg | ORAL_TABLET | Freq: Every day | ORAL | Status: DC
  Administered 2018-08-09: 80 mg via ORAL
  Filled 2018-08-09: qty 1

## 2018-08-09 MED ORDER — SUGAMMADEX SODIUM 200 MG/2ML IV SOLN
INTRAVENOUS | Status: DC | PRN
  Administered 2018-08-09: 200 mg via INTRAVENOUS

## 2018-08-09 MED ORDER — BUPIVACAINE HCL (PF) 0.25 % IJ SOLN
INTRAMUSCULAR | Status: AC
  Filled 2018-08-09: qty 30

## 2018-08-09 MED ORDER — SUCCINYLCHOLINE CHLORIDE 200 MG/10ML IV SOSY
PREFILLED_SYRINGE | INTRAVENOUS | Status: DC | PRN
  Administered 2018-08-09: 100 mg via INTRAVENOUS

## 2018-08-09 MED ORDER — SODIUM CHLORIDE 0.9% FLUSH: 3.0000 mL | INTRAVENOUS | Status: DC | PRN

## 2018-08-09 MED ORDER — GABAPENTIN 300 MG PO CAPS: 300.0000 mg | ORAL_CAPSULE | ORAL | Status: DC

## 2018-08-09 MED ORDER — CHLORHEXIDINE GLUCONATE CLOTH 2 % EX PADS: 6.0000 | MEDICATED_PAD | Freq: Once | CUTANEOUS | Status: DC

## 2018-08-09 MED ORDER — SENNA 8.6 MG PO TABS
1.0000 | ORAL_TABLET | Freq: Two times a day (BID) | ORAL | Status: DC
  Filled 2018-08-09 (×2): qty 1

## 2018-08-09 MED ORDER — PNEUMOCOCCAL VAC POLYVALENT 25 MCG/0.5ML IJ INJ
0.5000 mL | INJECTION | INTRAMUSCULAR | Status: AC
  Administered 2018-08-10: 0.5 mL via INTRAMUSCULAR
  Filled 2018-08-09: qty 0.5

## 2018-08-09 MED ORDER — THROMBIN 20000 UNITS EX SOLR
CUTANEOUS | Status: AC
  Filled 2018-08-09: qty 20000

## 2018-08-09 MED ORDER — CARVEDILOL 6.25 MG PO TABS
6.2500 mg | ORAL_TABLET | Freq: Two times a day (BID) | ORAL | Status: DC
  Administered 2018-08-09 – 2018-08-10 (×2): 6.25 mg via ORAL
  Filled 2018-08-09 (×2): qty 1

## 2018-08-09 MED ORDER — HYDROMORPHONE HCL 1 MG/ML IJ SOLN
INTRAMUSCULAR | Status: AC
  Administered 2018-08-09: 0.5 mg via INTRAVENOUS
  Filled 2018-08-09: qty 1

## 2018-08-09 MED ORDER — KETOROLAC TROMETHAMINE 15 MG/ML IJ SOLN
15.0000 mg | Freq: Four times a day (QID) | INTRAMUSCULAR | Status: DC
  Administered 2018-08-09 – 2018-08-10 (×3): 15 mg via INTRAVENOUS
  Filled 2018-08-09 (×3): qty 1

## 2018-08-09 MED ORDER — METHOCARBAMOL 500 MG PO TABS
500.0000 mg | ORAL_TABLET | Freq: Four times a day (QID) | ORAL | Status: DC | PRN
  Administered 2018-08-09: 500 mg via ORAL

## 2018-08-09 MED ORDER — ONDANSETRON HCL 4 MG PO TABS: 4.0000 mg | ORAL_TABLET | Freq: Four times a day (QID) | ORAL | Status: DC | PRN

## 2018-08-09 MED ORDER — ACETAMINOPHEN 325 MG PO TABS: 650.0000 mg | ORAL_TABLET | ORAL | Status: DC | PRN

## 2018-08-09 MED ORDER — HYDROMORPHONE HCL 1 MG/ML IJ SOLN
0.2500 mg | INTRAMUSCULAR | Status: DC | PRN
  Administered 2018-08-09 (×4): 0.5 mg via INTRAVENOUS

## 2018-08-09 MED ORDER — PHENOL 1.4 % MT LIQD: 1.0000 | OROMUCOSAL | Status: DC | PRN

## 2018-08-09 MED ORDER — LOSARTAN POTASSIUM 50 MG PO TABS
25.0000 mg | ORAL_TABLET | Freq: Every day | ORAL | Status: DC
  Administered 2018-08-10: 10:00:00 25 mg via ORAL
  Filled 2018-08-09: qty 1

## 2018-08-09 MED ORDER — SUCCINYLCHOLINE CHLORIDE 200 MG/10ML IV SOSY
PREFILLED_SYRINGE | INTRAVENOUS | Status: AC
  Filled 2018-08-09: qty 10

## 2018-08-09 MED ORDER — ROCURONIUM BROMIDE 10 MG/ML (PF) SYRINGE
PREFILLED_SYRINGE | INTRAVENOUS | Status: DC | PRN
  Administered 2018-08-09: 20 mg via INTRAVENOUS
  Administered 2018-08-09 (×2): 10 mg via INTRAVENOUS
  Administered 2018-08-09: 50 mg via INTRAVENOUS

## 2018-08-09 MED ORDER — HYDROCODONE-ACETAMINOPHEN 5-325 MG PO TABS
1.0000 | ORAL_TABLET | ORAL | Status: DC | PRN
  Administered 2018-08-09: 2 via ORAL
  Filled 2018-08-09: qty 2

## 2018-08-09 MED ORDER — PROPOFOL 10 MG/ML IV BOLUS
INTRAVENOUS | Status: DC | PRN
  Administered 2018-08-09: 100 mg via INTRAVENOUS

## 2018-08-09 MED ORDER — ACETAMINOPHEN 500 MG PO TABS
1000.0000 mg | ORAL_TABLET | Freq: Once | ORAL | Status: AC
  Administered 2018-08-09: 06:00:00 1000 mg via ORAL
  Filled 2018-08-09: qty 2

## 2018-08-09 MED ORDER — METHOCARBAMOL 1000 MG/10ML IJ SOLN
500.0000 mg | Freq: Four times a day (QID) | INTRAVENOUS | Status: DC | PRN
  Filled 2018-08-09: qty 5

## 2018-08-09 MED ORDER — PHENYLEPHRINE 40 MCG/ML (10ML) SYRINGE FOR IV PUSH (FOR BLOOD PRESSURE SUPPORT)
PREFILLED_SYRINGE | INTRAVENOUS | Status: DC | PRN
  Administered 2018-08-09 (×2): 40 ug via INTRAVENOUS

## 2018-08-09 MED ORDER — FLEET ENEMA 7-19 GM/118ML RE ENEM: 1.0000 | ENEMA | Freq: Once | RECTAL | Status: DC | PRN

## 2018-08-09 MED ORDER — HYDROXYZINE HCL 25 MG PO TABS
100.0000 mg | ORAL_TABLET | Freq: Every day | ORAL | Status: DC
  Administered 2018-08-09: 100 mg via ORAL
  Filled 2018-08-09: qty 4

## 2018-08-09 MED ORDER — DIPHENOXYLATE-ATROPINE 2.5-0.025 MG PO TABS
1.0000 | ORAL_TABLET | Freq: Four times a day (QID) | ORAL | Status: DC | PRN
  Administered 2018-08-09: 1 via ORAL
  Filled 2018-08-09 (×3): qty 1

## 2018-08-09 MED ORDER — HYDROMORPHONE HCL 1 MG/ML IJ SOLN
INTRAMUSCULAR | Status: AC
  Administered 2018-08-09: 12:00:00 0.5 mg via INTRAVENOUS
  Filled 2018-08-09: qty 1

## 2018-08-09 MED ORDER — PREDNISONE 20 MG PO TABS: 20.0000 mg | ORAL_TABLET | Freq: Every day | ORAL | Status: DC

## 2018-08-09 MED ORDER — FENTANYL CITRATE (PF) 250 MCG/5ML IJ SOLN
INTRAMUSCULAR | Status: AC
  Filled 2018-08-09: qty 5

## 2018-08-09 MED ORDER — ONDANSETRON HCL 4 MG/2ML IJ SOLN: 4.0000 mg | Freq: Four times a day (QID) | INTRAMUSCULAR | Status: DC | PRN

## 2018-08-09 MED ORDER — MIDAZOLAM HCL 5 MG/5ML IJ SOLN
INTRAMUSCULAR | Status: DC | PRN
  Administered 2018-08-09: 2 mg via INTRAVENOUS

## 2018-08-09 MED ORDER — VANCOMYCIN HCL 1000 MG IV SOLR
INTRAVENOUS | Status: AC
  Filled 2018-08-09: qty 1000

## 2018-08-09 MED ORDER — SODIUM CHLORIDE 0.9 % IV SOLN
INTRAVENOUS | Status: DC | PRN
  Administered 2018-08-09: 07:00:00

## 2018-08-09 MED ORDER — PANTOPRAZOLE SODIUM 40 MG PO TBEC
40.0000 mg | DELAYED_RELEASE_TABLET | Freq: Every day | ORAL | Status: DC
  Administered 2018-08-10: 10:00:00 40 mg via ORAL
  Filled 2018-08-09: qty 1

## 2018-08-09 MED ORDER — POLYETHYLENE GLYCOL 3350 17 G PO PACK: 17.0000 g | PACK | Freq: Every day | ORAL | Status: DC | PRN

## 2018-08-09 MED ORDER — METHOCARBAMOL 500 MG PO TABS
ORAL_TABLET | ORAL | Status: AC
  Filled 2018-08-09: qty 1

## 2018-08-09 MED ORDER — NITROGLYCERIN 0.4 MG SL SUBL: 0.4000 mg | SUBLINGUAL_TABLET | SUBLINGUAL | Status: DC | PRN

## 2018-08-09 MED ORDER — THROMBIN 5000 UNITS EX SOLR
OROMUCOSAL | Status: DC | PRN
  Administered 2018-08-09: 07:00:00 via TOPICAL

## 2018-08-09 MED ORDER — OMEGA-3-ACID ETHYL ESTERS 1 G PO CAPS
2.0000 g | ORAL_CAPSULE | Freq: Two times a day (BID) | ORAL | Status: DC
  Administered 2018-08-10: 10:00:00 2 g via ORAL
  Filled 2018-08-09: qty 2

## 2018-08-09 MED ORDER — PROPOFOL 10 MG/ML IV BOLUS
INTRAVENOUS | Status: AC
  Filled 2018-08-09: qty 20

## 2018-08-09 MED ORDER — GABAPENTIN 300 MG PO CAPS
300.0000 mg | ORAL_CAPSULE | Freq: Every day | ORAL | Status: DC
  Administered 2018-08-10: 300 mg via ORAL
  Filled 2018-08-09: qty 1

## 2018-08-09 MED ORDER — DOCUSATE SODIUM 100 MG PO CAPS
100.0000 mg | ORAL_CAPSULE | Freq: Two times a day (BID) | ORAL | Status: DC
  Filled 2018-08-09 (×2): qty 1

## 2018-08-09 MED ORDER — PHENYLEPHRINE 40 MCG/ML (10ML) SYRINGE FOR IV PUSH (FOR BLOOD PRESSURE SUPPORT)
PREFILLED_SYRINGE | INTRAVENOUS | Status: AC
  Filled 2018-08-09: qty 10

## 2018-08-09 MED ORDER — ROCURONIUM BROMIDE 10 MG/ML (PF) SYRINGE
PREFILLED_SYRINGE | INTRAVENOUS | Status: AC
  Filled 2018-08-09: qty 10

## 2018-08-09 MED ORDER — MIDAZOLAM HCL 2 MG/2ML IJ SOLN
INTRAMUSCULAR | Status: AC
  Filled 2018-08-09: qty 2

## 2018-08-09 MED ORDER — TAMSULOSIN HCL 0.4 MG PO CAPS
0.4000 mg | ORAL_CAPSULE | Freq: Every day | ORAL | Status: DC
  Administered 2018-08-10: 0.4 mg via ORAL
  Filled 2018-08-09: qty 1

## 2018-08-09 MED ORDER — PREDNISONE 20 MG PO TABS
40.0000 mg | ORAL_TABLET | Freq: Every day | ORAL | Status: DC
  Administered 2018-08-10: 40 mg via ORAL
  Filled 2018-08-09: qty 2

## 2018-08-09 MED ORDER — BUPIVACAINE-EPINEPHRINE (PF) 0.25% -1:200000 IJ SOLN
INTRAMUSCULAR | Status: AC
  Filled 2018-08-09: qty 10

## 2018-08-09 MED ORDER — THROMBIN 5000 UNITS EX SOLR
CUTANEOUS | Status: AC
  Filled 2018-08-09: qty 5000

## 2018-08-09 MED ORDER — FENTANYL CITRATE (PF) 250 MCG/5ML IJ SOLN
INTRAMUSCULAR | Status: DC | PRN
  Administered 2018-08-09: 100 ug via INTRAVENOUS
  Administered 2018-08-09 (×2): 50 ug via INTRAVENOUS
  Administered 2018-08-09: 25 ug via INTRAVENOUS
  Administered 2018-08-09: 50 ug via INTRAVENOUS

## 2018-08-09 MED ORDER — ALUM & MAG HYDROXIDE-SIMETH 200-200-20 MG/5ML PO SUSP: 30.0000 mL | Freq: Four times a day (QID) | ORAL | Status: DC | PRN

## 2018-08-09 MED ORDER — ACETAMINOPHEN 325 MG PO TABS: 650.0000 mg | ORAL_TABLET | Freq: Four times a day (QID) | ORAL | Status: DC | PRN

## 2018-08-09 MED ORDER — LIDOCAINE-EPINEPHRINE 1 %-1:100000 IJ SOLN
INTRAMUSCULAR | Status: AC
  Filled 2018-08-09: qty 1

## 2018-08-09 MED ORDER — FUROSEMIDE 20 MG PO TABS: 20.0000 mg | ORAL_TABLET | Freq: Every day | ORAL | Status: DC | PRN

## 2018-08-09 MED ORDER — MORPHINE SULFATE (PF) 2 MG/ML IV SOLN
2.0000 mg | INTRAVENOUS | Status: DC | PRN
  Administered 2018-08-09: 4 mg via INTRAVENOUS
  Filled 2018-08-09: qty 2

## 2018-08-09 MED ORDER — LIDOCAINE 2% (20 MG/ML) 5 ML SYRINGE
INTRAMUSCULAR | Status: DC | PRN
  Administered 2018-08-09: 80 mg via INTRAVENOUS

## 2018-08-09 MED ORDER — LACTATED RINGERS IV SOLN
INTRAVENOUS | Status: DC
  Administered 2018-08-09: 06:00:00 via INTRAVENOUS

## 2018-08-09 MED ORDER — LACTATED RINGERS IV SOLN
INTRAVENOUS | Status: DC | PRN
  Administered 2018-08-09 (×2): via INTRAVENOUS

## 2018-08-09 MED ORDER — GABAPENTIN 600 MG PO TABS
600.0000 mg | ORAL_TABLET | Freq: Every day | ORAL | Status: DC
  Administered 2018-08-09: 600 mg via ORAL
  Filled 2018-08-09: qty 1

## 2018-08-09 MED ORDER — SODIUM CHLORIDE 0.9 % IV SOLN: 250.0000 mL | INTRAVENOUS | Status: DC

## 2018-08-09 MED ORDER — MORPHINE SULFATE (PF) 4 MG/ML IV SOLN
INTRAVENOUS | Status: AC
  Administered 2018-08-09: 4 mg
  Filled 2018-08-09: qty 1

## 2018-08-09 MED ORDER — SPIRONOLACTONE 12.5 MG HALF TABLET
12.5000 mg | ORAL_TABLET | Freq: Every day | ORAL | Status: DC
  Administered 2018-08-10: 12.5 mg via ORAL
  Filled 2018-08-09: qty 1

## 2018-08-09 MED ORDER — 0.9 % SODIUM CHLORIDE (POUR BTL) OPTIME
TOPICAL | Status: DC | PRN
  Administered 2018-08-09: 1000 mL

## 2018-08-09 MED ORDER — HYDROCODONE-ACETAMINOPHEN 5-325 MG PO TABS
ORAL_TABLET | ORAL | Status: AC
  Filled 2018-08-09: qty 2

## 2018-08-09 MED ORDER — SODIUM CHLORIDE 0.9 % IV SOLN
INTRAVENOUS | Status: DC | PRN
  Administered 2018-08-09: 08:00:00 15 ug/min via INTRAVENOUS

## 2018-08-09 MED ORDER — BISACODYL 10 MG RE SUPP: 10.0000 mg | Freq: Every day | RECTAL | Status: DC | PRN

## 2018-08-09 MED ORDER — EPHEDRINE SULFATE-NACL 50-0.9 MG/10ML-% IV SOSY
PREFILLED_SYRINGE | INTRAVENOUS | Status: DC | PRN
  Administered 2018-08-09 (×2): 5 mg via INTRAVENOUS
  Administered 2018-08-09 (×2): 10 mg via INTRAVENOUS
  Administered 2018-08-09 (×2): 5 mg via INTRAVENOUS

## 2018-08-09 MED ORDER — THROMBIN 20000 UNITS EX SOLR
CUTANEOUS | Status: DC | PRN
  Administered 2018-08-09: 07:00:00 via TOPICAL

## 2018-08-09 MED ORDER — LIDOCAINE-EPINEPHRINE 1 %-1:100000 IJ SOLN
INTRAMUSCULAR | Status: DC | PRN
  Administered 2018-08-09: 5 mL

## 2018-08-09 MED ORDER — MENTHOL 3 MG MT LOZG: 1.0000 | LOZENGE | OROMUCOSAL | Status: DC | PRN

## 2018-08-09 MED ORDER — ACETAMINOPHEN 650 MG RE SUPP: 650.0000 mg | RECTAL | Status: DC | PRN

## 2018-08-09 MED ORDER — DEXAMETHASONE SODIUM PHOSPHATE 10 MG/ML IJ SOLN
INTRAMUSCULAR | Status: DC | PRN
  Administered 2018-08-09: 10 mg via INTRAVENOUS

## 2018-08-09 MED ORDER — BUPIVACAINE HCL (PF) 0.25 % IJ SOLN
INTRAMUSCULAR | Status: DC | PRN
  Administered 2018-08-09: 25 mL
  Administered 2018-08-09: 5 mL

## 2018-08-09 SURGICAL SUPPLY — 74 items
BAG DECANTER FOR FLEXI CONT (MISCELLANEOUS) ×2 IMPLANT
BENZOIN TINCTURE PRP APPL 2/3 (GAUZE/BANDAGES/DRESSINGS) IMPLANT
BLADE CLIPPER SURG (BLADE) ×2 IMPLANT
BLADE SURG 11 STRL SS (BLADE) IMPLANT
BUR MATCHSTICK NEURO 3.0 LAGG (BURR) ×2 IMPLANT
CANISTER SUCT 3000ML PPV (MISCELLANEOUS) ×2 IMPLANT
CARTRIDGE OIL MAESTRO DRILL (MISCELLANEOUS) ×1 IMPLANT
CATH ROBINSON RED A/P 10FR (CATHETERS) IMPLANT
CLIP VESOCCLUDE MED 6/CT (CLIP) IMPLANT
COVER WAND RF STERILE (DRAPES) ×2 IMPLANT
DECANTER SPIKE VIAL GLASS SM (MISCELLANEOUS) ×2 IMPLANT
DERMABOND ADVANCED (GAUZE/BANDAGES/DRESSINGS) ×1
DERMABOND ADVANCED .7 DNX12 (GAUZE/BANDAGES/DRESSINGS) ×1 IMPLANT
DIFFUSER DRILL AIR PNEUMATIC (MISCELLANEOUS) ×2 IMPLANT
DRAPE LAPAROTOMY 100X72 PEDS (DRAPES) IMPLANT
DRAPE LAPAROTOMY 100X72X124 (DRAPES) ×2 IMPLANT
DRAPE MICROSCOPE LEICA (MISCELLANEOUS) ×2 IMPLANT
DRAPE POUCH INSTRU U-SHP 10X18 (DRAPES) ×2 IMPLANT
DURAPREP 26ML APPLICATOR (WOUND CARE) ×2 IMPLANT
ELECT REM PT RETURN 9FT ADLT (ELECTROSURGICAL) ×2
ELECTRODE REM PT RTRN 9FT ADLT (ELECTROSURGICAL) ×1 IMPLANT
GAUZE 4X4 16PLY RFD (DISPOSABLE) IMPLANT
GAUZE SPONGE 4X4 12PLY STRL (GAUZE/BANDAGES/DRESSINGS) IMPLANT
GLOVE BIO SURGEON STRL SZ7.5 (GLOVE) ×2 IMPLANT
GLOVE BIOGEL PI IND STRL 7.5 (GLOVE) IMPLANT
GLOVE BIOGEL PI IND STRL 8 (GLOVE) ×1 IMPLANT
GLOVE BIOGEL PI IND STRL 8.5 (GLOVE) ×1 IMPLANT
GLOVE BIOGEL PI INDICATOR 7.5 (GLOVE)
GLOVE BIOGEL PI INDICATOR 8 (GLOVE) ×1
GLOVE BIOGEL PI INDICATOR 8.5 (GLOVE) ×1
GLOVE ECLIPSE 7.5 STRL STRAW (GLOVE) ×8 IMPLANT
GLOVE ECLIPSE 8.5 STRL (GLOVE) ×2 IMPLANT
GLOVE EXAM NITRILE XL STR (GLOVE) IMPLANT
GOWN STRL REUS W/ TWL LRG LVL3 (GOWN DISPOSABLE) IMPLANT
GOWN STRL REUS W/ TWL XL LVL3 (GOWN DISPOSABLE) IMPLANT
GOWN STRL REUS W/TWL 2XL LVL3 (GOWN DISPOSABLE) ×4 IMPLANT
GOWN STRL REUS W/TWL LRG LVL3 (GOWN DISPOSABLE)
GOWN STRL REUS W/TWL XL LVL3 (GOWN DISPOSABLE)
HEMOSTAT POWDER SURGIFOAM 1G (HEMOSTASIS) ×2 IMPLANT
HEMOSTAT SURGICEL 2X14 (HEMOSTASIS) ×2 IMPLANT
KIT BASIN OR (CUSTOM PROCEDURE TRAY) ×2 IMPLANT
KIT TURNOVER KIT B (KITS) ×2 IMPLANT
KNIFE ARACHNOID DISP AM-24-S (MISCELLANEOUS) ×2 IMPLANT
NEEDLE HYPO 22GX1.5 SAFETY (NEEDLE) ×2 IMPLANT
NS IRRIG 1000ML POUR BTL (IV SOLUTION) ×2 IMPLANT
OIL CARTRIDGE MAESTRO DRILL (MISCELLANEOUS) ×2
PACK LAMINECTOMY NEURO (CUSTOM PROCEDURE TRAY) ×2 IMPLANT
PAD ARMBOARD 7.5X6 YLW CONV (MISCELLANEOUS) ×6 IMPLANT
PATTIES SURGICAL .25X.25 (GAUZE/BANDAGES/DRESSINGS) IMPLANT
PATTIES SURGICAL .5 X1 (DISPOSABLE) IMPLANT
PATTIES SURGICAL .5 X3 (DISPOSABLE) IMPLANT
PATTIES SURGICAL 1/4 X 3 (GAUZE/BANDAGES/DRESSINGS) IMPLANT
RUBBERBAND STERILE (MISCELLANEOUS) ×4 IMPLANT
SPECIMEN JAR SMALL (MISCELLANEOUS) IMPLANT
SPONGE LAP 4X18 RFD (DISPOSABLE) IMPLANT
SPONGE SURGIFOAM ABS GEL 100 (HEMOSTASIS) ×2 IMPLANT
STAPLER SKIN PROX WIDE 3.9 (STAPLE) ×2 IMPLANT
STRIP CLOSURE SKIN 1/2X4 (GAUZE/BANDAGES/DRESSINGS) IMPLANT
SUT NURALON 4 0 TR CR/8 (SUTURE) ×2 IMPLANT
SUT PDS AB 1 CTX 36 (SUTURE) IMPLANT
SUT PROLENE 6 0 BV (SUTURE) ×10 IMPLANT
SUT SILK 3 0 TIES 17X18 (SUTURE)
SUT SILK 3-0 18XBRD TIE BLK (SUTURE) IMPLANT
SUT VIC AB 1 CT1 18XBRD ANBCTR (SUTURE) ×1 IMPLANT
SUT VIC AB 1 CT1 8-18 (SUTURE) ×1
SUT VIC AB 2-0 CP2 18 (SUTURE) ×2 IMPLANT
SUT VIC AB 3-0 SH 8-18 (SUTURE) IMPLANT
SYR CONTROL 10ML LL (SYRINGE) ×2 IMPLANT
TIP NONSTICK .5MMX23CM (INSTRUMENTS) ×1
TIP NONSTICK .5X23 (INSTRUMENTS) ×1 IMPLANT
TOWEL GREEN STERILE (TOWEL DISPOSABLE) ×2 IMPLANT
TOWEL GREEN STERILE FF (TOWEL DISPOSABLE) ×2 IMPLANT
TRAY FOLEY MTR SLVR 16FR STAT (SET/KITS/TRAYS/PACK) ×2 IMPLANT
WATER STERILE IRR 1000ML POUR (IV SOLUTION) ×2 IMPLANT

## 2018-08-09 NOTE — Anesthesia Postprocedure Evaluation (Signed)
Anesthesia Post Note  Patient: Jack [name]  Procedure(s) Performed: Lumbar three Laminectomy, excision of intradural tumor (N/A )     Patient location during evaluation: PACU Anesthesia Type: General Level of consciousness: awake and alert Pain management: pain level controlled Vital Signs Assessment: post-procedure vital signs reviewed and stable Respiratory status: spontaneous breathing, nonlabored ventilation and respiratory function stable Cardiovascular status: blood pressure returned to baseline and stable Postop Assessment: no apparent nausea or vomiting Anesthetic complications: no    Last Vitals:  Vitals:   08/09/18 1122 08/09/18 1137  BP: 125/81 115/69  Pulse: 73 67  Resp: 15 14  Temp:    SpO2: 100% 95%    Last Pain:  Vitals:   08/09/18 1143  TempSrc:   PainSc: Asleep                 Jessicaann Overbaugh,W. EDMOND

## 2018-08-09 NOTE — Telephone Encounter (Signed)
Pts wife calling stating pt is still having issues with diarrhea. He had neurosurgery today at [facility] today and should be discharged tomorrow. She called wanting calprotectin results. Discussed with pts wife the results and let her know they had been sent to her husband via mychart. Does not appear he read the message even thought he read messages on 4/15-4/20, he did not read the 4/22 message but read one after that date. He never told her about it and did not take the prednisone taper as ordered. She wants to know if he could be given IV steroids while in hospital. She states he is very anxious about not being able to make it to the bathroom in time. Please advise.

## 2018-08-09 NOTE — Op Note (Signed)
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  History of schwannoma Postoperative diagnosis: Same Procedure: L3 laminectomy and gross total excision of intradural extra medullary tumor with operating microscope microdissection technique Surgeon: Kristeen Miss First Assistant: Consuella Lose MD Anesthesia: General endotracheal Indications: Thoams Siefert is a 53 year old individual whose had significant radicular pain initially on the left side then on the right side and now involving bilateral lower extremity symptoms.  He was advised regarding surgery to decompress this lesion which caused a complete myelographic block.  He is now taken to the operating room for the surgery.  Procedure: Patient was brought to the operating room supine on the stretcher.  After the smooth induction of general endotracheal anesthesia, he was turned prone.  The bony prominences were appropriately padded and protected and then the back was prepped with alcohol DuraPrep and draped in a sterile fashion.  A singular localizing radiograph was obtained to localize L3 with a needle in place and then a vertical incision was created in this region and carried down to the lumbodorsal fascia which was opened on either side of midline.  A second radiograph was then obtained once a small laminotomy had been created to check localization over the inferior portion of the body of L3.  When this was affirmed then the laminotomies were increased on both sides and the entirety of the spinous process and laminar arch was removed from L3.  Care was taken to make sure that the facet joint at L3-L4 remained intact.  The bleeding bony edges were controlled with Gelfoam soaked in thrombin packed into this region.  Then the center portion of the dura was opened with a 6-day suture being placed on one edge of the dura and a #15 blade  used in the midline.  A Woodson dissector was then used to protect the underlying arachnoid and open the dura further in a cephalocaudal direction.  6-0 Prolene stay sutures were placed on either side of the dura.  The arachnoid was open the nerve roots were noted to be significantly located dorsally and after dissecting through the arachnoid the mass could be identified in this region it was bluish-gray in color and on further dissection of the arachnoid it was clear that there was a singular nerve root entering it in the cephalad portion and a singular nerve root exiting in the inferior portion.  In the undersurface of the lesion there was noted to be some nerve roots that were attached with a small arachnoid adhesions and these were carefully dissected away.  Once the entirety of the tumor was isolated with small cottonoid pledgets being placed on either side of it to separated from the surrounding nerve roots the proximal portion was amputated after cauterizing across the base of the nerve root.  The distal portion was then similarly resected.  Tumor was sent for specimen.  The area was inspected carefully and no other lesions were identified and the nerve roots appear to be in good condition the dura was then closed with 6-0 Prolene in a running fashion.  A Valsalva maneuver to 40 cm of water was used to verify that there was no leakage in the primary closure of the dura.  When this was verified the microscope was removed the retractors were removed and then 20 cc of half percent Marcaine was injected into the paraspinous fascia and the fascia was closed with #1 Vicryl in interrupted  fashion 2-0 Vicryl was used in subcutaneous tissues.  3-0 Vicryl was used to close the subcuticular skin.  Dermabond was placed on the skin.  Blood loss for the procedure was estimated at less than 100 cc.

## 2018-08-09 NOTE — H&P (Signed)
Jack [name] is an 53 y.o. male.   Chief Complaint: Bilateral lower extremity radiculopathy HPI: [name] is a 53 year old individual whose had significant problems with lumbar radiculopathy neither the left or right lower extremity for about 9 years.  Of time has had a work-up for schwannoma as he had a schwannoma resected from the axilla and 2011.  He underwent a laminotomy and foraminotomies on the left side at L5-S1 apparently looking for a schwannoma in that area but no abnormality was found.  He had been treated in [facility] at that time and ultimately underwent placement of a spinal cord stimulator for intractable pain this did not seem to provide adequate relief of his leg pain the stimulator was revised with leads being lowered to the T12-L1 level this did seem to provide some element of relief soon after that the patient had a recurrence of Crohn's disease and he was started on prednisone prednisone seem to ameliorate the left lower extremity pain since about 2012 he has been on prednisone chronically he was diagnosed as having had adrenal insufficiency he has been maintained on the prednisone for the past 9 years.  During that time he has had episodes of left lower extremity pain that has not given him that much of a problem then in July of this past year he started developing similar symptoms into the right calf and right lower extremity he notes that the symptoms became progressively more severe and walking with 10 to alleviate the pain there is a certain times that he could walk to alleviate the pain but if he continued to walk beyond a certain point he would aggravate the symptoms further pain would recur and be quite severe in November he notes that the pain was so severe he called his wife from [place] to come and take him to the emergency department after being in the emergency department the pain seemed to recover spontaneously following day however he had a myocardial infarct he  required stenting for complete LAD lesion into other 70% lesions have been since treated with blood thinners he has had a couple episodes of GI bleeding notes that he can tolerate some level of activity but the right lower extremity pain still bothers particularly if he walks any greater distance a myelogram was performed in March of this year and this demonstrates the presence of a intradural extra medullary lesion consistent with a schwannoma.  There is a high-grade stenosis to the flow of any contrast agent and because of this lesion he is now been advised to undergo surgical extirpation.  His past medical history is notable for Crohn's disease irritable bowel syndrome vasculitis Churg-Strauss disease.  Current medications include warfarin prednisone atorvastatin calcium carbonate part of L clopidogrel colchicine in addition to Lomotil gabapentin Lasix hydroxyzine losartan multivitamin Protonix spironolactone tamsulosin Entyvio 300 mg infusion every 8 weeks.  Past Medical History:  Diagnosis Date  . BPH (benign prostatic hypertrophy)   . CAD (coronary artery disease)    a. anterior STEMI 01/2018 -  proximal occlusion of LAD, treated with DES. Cath also showed 20% distal LM, 95% ostial-prox small-moderate ramus, 70-80% ostial Cx, 70% dominant ostial OM, 50-60% prox Cx, 70% RCA. EF 35% by cath with LVEDP 87mHg. Med rx for residual disease. Course complicated by post MI pericarditis and LV thrombus.  . Chronic systolic (congestive) heart failure ([place]   . Colitis   . Colon polyp    inflammatory  . Crohn disease ([place] 1992   history  uveitis, involvement of intestines and lungs  . Diverticulosis   . Eosinophilic granuloma ([place])   . Essential hypertension   . GERD (gastroesophageal reflux disease)   . GI bleeding   . History of chicken pox   . History of gastroesophageal reflux (GERD)   . History of seizure 1995   grand mal x1, completed 6 yrs dilantin. no seizures since  . Hyperlipidemia   .  Internal hemorrhoids   . Ischemic cardiomyopathy   . LV (left ventricular) mural thrombus following MI ([place]) 01/2018  . Myocardial infarction ([place])   . Osteoporosis 11/2015   DEXA T -2.9  . Schwannoma 2007   L axilla s/p surgery  . Seizures ([place])    last seizure 1995 and only x 1 seizure-   . Ulnar neuropathy    h/o this from L arm schwannoma    Past Surgical History:  Procedure Laterality Date  . APPENDECTOMY  2000  . BACK SURGERY  2011   lumbar  . CARDIAC CATHETERIZATION    . CARDIOVASCULAR STRESS TEST  01/2015   low risk study  . CHOLECYSTECTOMY  2000  . COLONOSCOPY  08/2014   f/u crohn's, 4 inflammatory polyps, diverticulosis, improved, rpt 2 yrs ([place])  . COLONOSCOPY WITH PROPOFOL N/A 04/09/2018   inflammatory polyp, f/u left to primary GI - [facility] (Danis, Kirke Corin, MD)  . CORONARY ANGIOGRAPHY N/A 02/21/2018   Procedure: CORONARY ANGIOGRAPHY;  Surgeon: Belva Crome, MD;  Location: [facility];  Service: Cardiovascular;  Laterality: N/A;  . CORONARY/GRAFT ACUTE MI REVASCULARIZATION N/A 02/20/2018   Procedure: Coronary/Graft Acute MI Revascularization;  Surgeon: Belva Crome, MD;  Location: [facility];  Service: Cardiovascular;  Laterality: N/A;  . ESOPHAGOGASTRODUODENOSCOPY  07/2014   h/o EE resolved, focal reflux esophagitis, chronic active gastritis  . extremity surgery Left    ulnar nerve repair after schwannoma removal  . FLEXIBLE SIGMOIDOSCOPY N/A 04/14/2018   Procedure: FLEXIBLE SIGMOIDOSCOPY;  Surgeon: Milus Banister, MD;  Location: [facility] ENDOSCOPY;  Service: Endoscopy;  Laterality: N/A;  . INGUINAL HERNIA REPAIR Bilateral 2017  . LEFT HEART CATH AND CORONARY ANGIOGRAPHY N/A 02/20/2018   Procedure: LEFT HEART CATH AND CORONARY ANGIOGRAPHY;  Surgeon: Belva Crome, MD;  Location: [facility];  Service: Cardiovascular;  Laterality: N/A;  . LUMBAR SPINE SURGERY  2011  . POLYPECTOMY  04/09/2018   Procedure: POLYPECTOMY;  Surgeon: Doran Stabler, MD;  Location: [facility] ENDOSCOPY;  Service: Gastroenterology;;  . SPINAL CORD STIMULATOR IMPLANT  2012   x2 (Dr Berton Mount)  . SUBMUCOSAL INJECTION  04/09/2018   Procedure: SUBMUCOSAL INJECTION;  Surgeon: Doran Stabler, MD;  Location: [facility] ENDOSCOPY;  Service: Gastroenterology;;  . TONSILLECTOMY  1996  . TUMOR REMOVAL Left 2007   schwannoma from L armpit  . UPPER GASTROINTESTINAL ENDOSCOPY      Family History  Problem Relation Age of Onset  . CAD Mother 36       CABG  . Congenital heart disease Mother   . Hypertension Mother   . Hyperlipidemia Mother   . CAD Father 13       CABG  . Hypertension Father   . Hyperlipidemia Father   . Colon cancer Maternal Grandmother   . Crohn's disease Brother   . Diabetes Neg Hx   . Colon polyps Neg Hx   . Esophageal cancer Neg Hx   . Rectal cancer Neg Hx   . Stomach cancer Neg Hx  Social History:  reports that he quit smoking about 5 months ago. His smoking use included cigars. He has never used smokeless tobacco. He reports current alcohol use. He reports that he does not use drugs.  Allergies:  Allergies  Allergen Reactions  . Cephalexin Nausea And Vomiting    Other Reaction: GI UPSET  . Duloxetine Other (See Comments)    HEADACHE      Medications Prior to Admission  Medication Sig Dispense Refill  . acetaminophen (TYLENOL) 325 MG tablet Take 650 mg by mouth every 6 (six) hours as needed for mild pain or headache.     . alendronate (FOSAMAX) 70 MG tablet Take 70 mg by mouth every Saturday.   11  . atorvastatin (LIPITOR) 80 MG tablet Take 1 tablet (80 mg total) by mouth daily at 6 PM. 90 tablet 3  . Calcium Carbonate-Vit D-Min (CALCIUM 1200 PO) Take 1 capsule by mouth 2 (two) times daily.    . carvedilol (COREG) 6.25 MG tablet Take 1 tablet (6.25 mg total) by mouth 2 (two) times daily. 180 tablet 3  . clopidogrel (PLAVIX) 75 MG tablet Take 1 tablet (75 mg total) by mouth daily. 30 tablet 12  . diphenoxylate-atropine  (LOMOTIL) 2.5-0.025 MG tablet Take 1-2 tablets by mouth 4 times daily as needed for diarrhea 90 tablet 1  . ferrous sulfate 325 (65 FE) MG tablet Take 1 tablet (325 mg total) by mouth daily with breakfast. (Patient taking differently: Take 325 mg by mouth at bedtime. ) 1 tablet 0  . gabapentin (NEURONTIN) 300 MG capsule Take 1 capsule (300 mg total) by mouth 3 (three) times daily. Take one capsule TID (Patient taking differently: Take 300-600 mg by mouth See admin instructions. 300 mg in the morning, and 600 mg at bedtime) 270 capsule 1  . HYDROcodone-acetaminophen (NORCO/VICODIN) 5-325 MG tablet Take 1 tablet by mouth every 6 (six) hours as needed. for pain    . hydrOXYzine (ATARAX/VISTARIL) 25 MG tablet TAKE 4 TABLETS (100 MG TOTAL) BY MOUTH AT BEDTIME. 360 tablet 2  . Icosapent Ethyl (VASCEPA) 1 g CAPS Take 2 capsules (2 g total) by mouth 2 (two) times daily. 120 capsule 11  . losartan (COZAAR) 25 MG tablet Take 25 mg by mouth daily.    . Multiple Vitamin (MULTIVITAMIN) capsule Take 1 capsule by mouth daily.    . nitroGLYCERIN (NITROSTAT) 0.4 MG SL tablet Place 1 tablet (0.4 mg total) under the tongue every 5 (five) minutes as needed for chest pain. 25 tablet 12  . pantoprazole (PROTONIX) 40 MG tablet Take 1 tablet (40 mg total) by mouth daily. 1 tablet 0  . predniSONE (DELTASONE) 10 MG tablet Take 20 mg by mouth daily.     [name] spironolactone (ALDACTONE) 25 MG tablet Take 0.5 tablets (12.5 mg total) by mouth daily. 90 tablet 3  . tadalafil (CIALIS) 5 MG tablet TAKE ONE TABLET BY MOUTH DAILY AS NEEDED FOR ERECTILE DYSFUNCTION (Patient taking differently: Take 5 mg by mouth daily as needed for erectile dysfunction. ) 20 tablet 5  . tamsulosin (FLOMAX) 0.4 MG CAPS capsule TAKE 1 CAPSULE (0.4 MG TOTAL) BY MOUTH DAILY. 90 capsule 3  . vedolizumab (ENTYVIO) 300 MG injection Inject 300 mg into the vein every 7 (seven) weeks.     [name] warfarin (COUMADIN) 5 MG tablet Take 2.5-5 mg by mouth See admin  instructions. Take 5 mg by mouth in the evening on Sun/Mon/Wed/Thurs/Fr/Sat and 2.5 mg on Tues (Note: PERMANENT STOP DATE IS 08/04/2018)    .  furosemide (LASIX) 20 MG tablet Take only as needed (Patient taking differently: Take 20 mg by mouth daily as needed for fluid. Take only as needed) 90 tablet 3    Results for orders placed or performed during the hospital encounter of 08/09/18 (from the past 48 hour(s))  PT- INR Day of Surgery     Status: Abnormal   Collection Time: 08/09/18  6:20 AM  Result Value Ref Range   Prothrombin Time 15.6 (H) 11.4 - 15.2 seconds   INR 1.3 (H) 0.8 - 1.2    Comment: (NOTE) INR goal varies based on device and disease states. Performed at [facility], [place] [address] [place], [place] [address]    No results found.  Review of Systems  Constitutional: Negative.   HENT: Negative.   Eyes: Negative.   Respiratory: Negative.   Cardiovascular: Negative.   Gastrointestinal: Negative.   Genitourinary: Negative.   Musculoskeletal: Positive for myalgias.  Skin: Negative.   Neurological: Positive for tingling and weakness.  Endo/Heme/Allergies: Negative.   Psychiatric/Behavioral: Negative.     Blood pressure 125/76, pulse 63, temperature 97.9 F (36.6 C), temperature source Oral, resp. rate 18, height 5' 7"  (1.702 m), weight 75.3 kg, SpO2 100 %. Physical Exam  Constitutional: He is oriented to person, place, and time. He appears well-developed and well-nourished.  HENT:  Head: Normocephalic and atraumatic.  Eyes: Pupils are equal, round, and reactive to light. Conjunctivae and EOM are normal.  Neck: Normal range of motion. Neck supple.  Cardiovascular: Normal rate and regular rhythm.  Respiratory: Effort normal and breath sounds normal.  GI: Soft. Bowel sounds are normal.  Musculoskeletal:     Comments: Straight leg raising is negative bilaterally  Neurological: He is alert and oriented to person, place, and time.  Absent deep tendon reflexes   Skin: Skin is warm and dry.  Psychiatric: He has a normal mood and affect. His behavior is normal. Judgment and thought content normal.     Assessment/Plan Intradural extra medullary mass L3 most consistent with a schwannoma.  Plan: Laminectomy and excision of intradural extra medullary mass.  Earleen Newport, MD 08/09/2018, 7:40 AM

## 2018-08-09 NOTE — Progress Notes (Signed)
Patient ID: Jack [name], male   DOB: 12-Oct-1965, 53 y.o.   MRN: [id number] Vital signs are stable Patient complaining of centralized back pain I have encouraged mobilization He has voided postop Stable

## 2018-08-09 NOTE — Telephone Encounter (Signed)
Thank you for the update Jack [name]. If they reach out to [place] with need for assistance we would be happy to be available.

## 2018-08-09 NOTE — Telephone Encounter (Signed)
error 

## 2018-08-09 NOTE — Transfer of Care (Signed)
Immediate Anesthesia Transfer of Care Note  Patient: Jack [name]  Procedure(s) Performed: Lumbar three Laminectomy, excision of intradural tumor (N/A )  Patient Location: PACU  Anesthesia Type:General  Level of Consciousness: awake, alert  and oriented  Airway & Oxygen Therapy: Patient Spontanous Breathing and Patient connected to face mask oxygen  Post-op Assessment: Report given to RN and Post -op Vital signs reviewed and stable  Post vital signs: Reviewed and stable  Last Vitals:  Vitals Value Taken Time  BP 119/76 08/09/2018 10:52 AM  Temp    Pulse 70 08/09/2018 10:54 AM  Resp 13 08/09/2018 10:54 AM  SpO2 100 % 08/09/2018 10:54 AM  Vitals shown include unvalidated device data.  Last Pain:  Vitals:   08/09/18 0612  TempSrc:   PainSc: 0-No pain         Complications: No apparent anesthesia complications

## 2018-08-09 NOTE — Progress Notes (Signed)
Patient's wife called to PACU concerned about potentially increasing steroid dose due to Chron's flare-up. Dr. Ellene Route made aware. No new orders received at this time.

## 2018-08-09 NOTE — Social Work (Signed)
CSW acknowledging consult for SNF placement. Will follow for therapy recommendations.   Iridiana Fonner, MSW, LCSWA [place] Clinical Social Work [phone]   

## 2018-08-09 NOTE — Anesthesia Procedure Notes (Signed)
Procedure Name: Intubation Date/Time: 08/09/2018 7:48 AM Performed by: Jearld Pies, CRNA Pre-anesthesia Checklist: Patient identified, Emergency Drugs available, Suction available and Patient being monitored Patient Re-evaluated:Patient Re-evaluated prior to induction Oxygen Delivery Method: Circle System Utilized Preoxygenation: Pre-oxygenation with 100% oxygen Induction Type: IV induction and Rapid sequence Laryngoscope Size: Miller and 2 Grade View: Grade I Tube type: Oral Tube size: 7.5 mm Number of attempts: 1 Airway Equipment and Method: Stylet and Oral airway Placement Confirmation: ETT inserted through vocal cords under direct vision,  positive ETCO2 and breath sounds checked- equal and bilateral Secured at: 22 cm Tube secured with: Tape Dental Injury: Teeth and Oropharynx as per pre-operative assessment

## 2018-08-09 NOTE — Anesthesia Procedure Notes (Signed)
Arterial Line Insertion Start/End5/01/2019 7:20 AM, 08/09/2018 7:25 AM Performed by: Jearld Pies, CRNA, CRNA  Patient location: Pre-op. Preanesthetic checklist: patient identified, IV checked, site marked, risks and benefits discussed, surgical consent, monitors and equipment checked, pre-op evaluation, timeout performed and anesthesia consent Lidocaine 1% used for infiltration Left, radial was placed Catheter size: 20 G Hand hygiene performed , maximum sterile barriers used  and Seldinger technique used Allen's test indicative of satisfactory collateral circulation Attempts: 1 Procedure performed without using ultrasound guided technique. Following insertion, dressing applied and Biopatch. Post procedure assessment: normal  Patient tolerated the procedure well with no immediate complications.

## 2018-08-09 NOTE — Progress Notes (Signed)
I was contacted by the patient's wife, Mickel Baas, regarding his chronic diarrhea felt in part related to his Crohn's disease.  He recently had a fecal calprotectin that was very high indicating bowel inflammation from IBD. He is now in the hospital recovering from neurosurgery. I had previously made a recommendation to the patient to increase prednisone to 40 mg daily but he did not see this MyChart message and thus did not act on this dose increase.  At this time, given that he is inpatient I will defer medication changes to Dr. Ellene Route, but if okay with neurosurgery I would recommend we increase prednisone to 40 mg and taper by 5 mg every 7 days He also has Entyvio on board for his Crohn's disease  If there are questions while he remains an inpatient, please contact me personally or the [facility] GI consulting physician.  We would be happy to see the patient if needed.  Zenovia Jarred, MD [facility]

## 2018-08-09 NOTE — Telephone Encounter (Signed)
As far as IV steroids in the hospital this would be up to the primary team (neurosurg).  GI consult team is available, if they have questions or need assistance while he is admitted  Otherwise, I would proceed with the steroid taper as I laid out in the result note for the fecal calprotectin.   We are also waiting on the entyvio level and to exclude antibodies, can you please check on this send out test

## 2018-08-09 NOTE — Telephone Encounter (Signed)
Spoke with pts wife and she is aware.

## 2018-08-10 ENCOUNTER — Encounter (HOSPITAL_COMMUNITY): Payer: Self-pay | Admitting: Neurological Surgery

## 2018-08-10 MED ORDER — HYDROCODONE-ACETAMINOPHEN 5-325 MG PO TABS: 1.0000 | ORAL_TABLET | ORAL | 0 refills | Status: DC | PRN

## 2018-08-10 MED ORDER — PREDNISONE 20 MG PO TABS: ORAL_TABLET | ORAL | 0 refills | Status: DC

## 2018-08-10 MED ORDER — DIAZEPAM 5 MG PO TABS: 5.0000 mg | ORAL_TABLET | Freq: Four times a day (QID) | ORAL | 0 refills | Status: DC | PRN

## 2018-08-10 NOTE — Evaluation (Signed)
Occupational Therapy Evaluation Patient Details Name: Jack [name] MRN: [id number] DOB: [birth year] Today's Date: 08/10/2018    History of Present Illness Pt is a 53 y/o male s/p Laminectomy and excision of intradural extra medullary mass at L3. PMH: crohn's disease, lumbar back surgery with spinal cord stimulator, MI.    Clinical Impression   PTA patient independent and working. Admitted for above and presenting at modified independent to independent level for ADLs, functional mobility and transfers.  Patient educated on back precautions, ADl compensatory techniques and recommendations.  No further OT needs have been identified.  OT signing off. Thank you for this referral.     Follow Up Recommendations  No OT follow up    Equipment Recommendations  None recommended by OT    Recommendations for Other Services       Precautions / Restrictions Precautions Precautions: Back Precaution Comments: reviewed precautions with pt  Required Braces or Orthoses: (no brace needed) Restrictions Weight Bearing Restrictions: No      Mobility Bed Mobility Overal bed mobility: Modified Independent             General bed mobility comments: reviewed log roll technique for protection of back  Transfers Overall transfer level: Independent               General transfer comment: no assist required    Balance Overall balance assessment: No apparent balance deficits (not formally assessed)                                         ADL either performed or assessed with clinical judgement   ADL Overall ADL's : Modified independent                                       General ADL Comments: reviewed back precautions, ADL compensatory techniques use of AE (seated for LB ADls, use of Long sponge for bathing), and safety      Vision         Perception     Praxis      Pertinent Vitals/Pain Pain Assessment: 0-10 Pain Score: 3  Pain  Location: incisional Pain Descriptors / Indicators: Discomfort Pain Intervention(s): Monitored during session     Hand Dominance     Extremity/Trunk Assessment Upper Extremity Assessment Upper Extremity Assessment: Overall WFL for tasks assessed   Lower Extremity Assessment Lower Extremity Assessment: Overall WFL for tasks assessed   Cervical / Trunk Assessment Cervical / Trunk Assessment: Other exceptions Cervical / Trunk Exceptions: s/p lumbar surgery   Communication Communication Communication: No difficulties   Cognition Arousal/Alertness: Awake/alert Behavior During Therapy: WFL for tasks assessed/performed Overall Cognitive Status: Within Functional Limits for tasks assessed                                     General Comments  reviewed energy conservation techniques     Exercises     Shoulder Instructions      Home Living Family/patient expects to be discharged to:: Private residence Living Arrangements: Spouse/significant other;Children Available Help at Discharge: Family;Available 24 hours/day Type of Home: House Home Access: Level entry     Home Layout: Multi-level;Able to live on main level with bedroom/bathroom  Bathroom Shower/Tub: Occupational psychologist: Standard     Home Equipment: Civil engineer, contracting - built in          Prior Functioning/Environment Level of Independence: Independent        Comments: working, driving         OT Problem List: Pain;Decreased knowledge of precautions      OT Treatment/Interventions:      OT Goals(Current goals can be found in the care plan section) Acute Rehab OT Goals Patient Stated Goal: home today OT Goal Formulation: With patient  OT Frequency:     Barriers to D/C:            Co-evaluation              AM-PAC OT "6 Clicks" Daily Activity     Outcome Measure Help from another person eating meals?: None Help from another person taking care of personal  grooming?: None Help from another person toileting, which includes using toliet, bedpan, or urinal?: None Help from another person bathing (including washing, rinsing, drying)?: None Help from another person to put on and taking off regular upper body clothing?: None Help from another person to put on and taking off regular lower body clothing?: None 6 Click Score: 24   End of Session    Activity Tolerance: Patient tolerated treatment well Patient left: in chair;with call bell/phone within reach  OT Visit Diagnosis: Pain Pain - part of body: (back incision)                Time: 0900-0910 OT Time Calculation (min): 10 min Charges:  OT General Charges $OT Visit: 1 Visit OT Evaluation $OT Eval Low Complexity: 1 Low  Delight Stare, OT Acute Rehabilitation Services Pager [phone] Office [phone]   Delight Stare 08/10/2018, 9:22 AM

## 2018-08-10 NOTE — Evaluation (Signed)
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 PMH: crohn's disease, lumbar back surgery with spinal cord stimulator, MI.   Clinical Impression  Patient seen for mobility assessment s/p spinal surgery. Mobilizing well. Educated patient on precautions, mobility expectations, safety and car transfers. No further acute PT needs. Will sign off.     Follow Up Recommendations No PT follow up    Equipment Recommendations  None recommended by PT    Recommendations for Other Services       Precautions / Restrictions Precautions Precautions: Back Precaution Comments: reviewed precautions with pt  Required Braces or Orthoses: (no brace needed) Restrictions Weight Bearing Restrictions: No      Mobility  Bed Mobility Overal bed mobility: Modified Independent             General bed mobility comments: reviewed log roll technique for protection of back  Transfers Overall transfer level: Independent               General transfer comment: no assist required  Ambulation/Gait Ambulation/Gait assistance: Independent Gait Distance (Feet): 510 Feet Assistive device: None Gait Pattern/deviations: WFL(Within Functional Limits)        Stairs Stairs: Yes Stairs assistance: Modified independent (Device/Increase time) Stair Management: One rail Right Number of Stairs: 5 General stair comments: no difficulty  Wheelchair Mobility    Modified Rankin (Stroke Patients Only)       Balance Overall balance assessment: Independent   Sitting balance-Leahy Scale: Normal       Standing balance-Leahy Scale: Good               High level balance activites: Direction changes;Sudden stops;Turns;Head turns High Level Balance Comments: independent with higher level balance  tasks             Pertinent Vitals/Pain Pain Assessment: 0-10 Pain Score: 3  Pain Location: incisional Pain Descriptors / Indicators: Discomfort Pain Intervention(s): Monitored during session    Home Living Family/patient expects to be discharged to:: Private residence Living Arrangements: Spouse/significant other;Children Available Help at Discharge: Family;Available 24 hours/day Type of Home: House Home Access: Level entry     Home Layout: Multi-level;Able to live on main level with bedroom/bathroom Home Equipment: Shower seat - built in      Prior Function Level of Independence: Independent         Comments: working, driving      Journalist, newspaper        Extremity/Trunk Assessment   Upper Extremity Assessment Upper Extremity Assessment: Overall WFL for tasks assessed    Lower Extremity Assessment Lower Extremity Assessment: Overall WFL for tasks assessed    Cervical / Trunk Assessment Cervical / Trunk Assessment: Other exceptions Cervical / Trunk Exceptions: s/p lumbar surgery  Communication   Communication: No difficulties  Cognition Arousal/Alertness: Awake/alert Behavior During Therapy: WFL for tasks assessed/performed Overall Cognitive Status: Within Functional Limits for tasks assessed                                        General Comments General comments (skin integrity, edema, etc.): reviewed energy conservation techniques     Exercises     Assessment/Plan    PT Assessment Patent does not need any further PT services  PT Problem List         PT Treatment Interventions      PT Goals (Current goals can be found in the Care Plan section)  Acute Rehab PT Goals Patient Stated Goal: home today PT Goal Formulation: All assessment and education complete, DC therapy    Frequency     Barriers to discharge        Co-evaluation               AM-PAC PT "6 Clicks" Mobility  Outcome Measure Help needed turning  from your back to your side while in a flat bed without using bedrails?: None Help needed moving from lying on your back to sitting on the side of a flat bed without using bedrails?: None Help needed moving to and from a bed to a chair (including a wheelchair)?: None Help needed standing up from a chair using your arms (e.g., wheelchair or bedside chair)?: None Help needed to walk in hospital room?: None Help needed climbing 3-5 steps with a railing? : A Little 6 Click Score: 23    End of Session   Activity Tolerance: Patient tolerated treatment well     PT Visit Diagnosis: Difficulty in walking, not elsewhere classified (R26.2)    Time: 1610-9604 PT Time Calculation (min) (ACUTE ONLY): 12 min   Charges:   PT Evaluation $PT Eval Low Complexity: Tarnov, PT DPT  Board Certified Neurologic Specialist Acute Rehabilitation Services Pager [phone] Office [place] 08/10/2018, 9:39 AM

## 2018-08-11 ENCOUNTER — Ambulatory Visit (HOSPITAL_COMMUNITY): Payer: BLUE CROSS/BLUE SHIELD

## 2018-08-11 ENCOUNTER — Telehealth: Payer: Self-pay | Admitting: *Deleted

## 2018-08-11 ENCOUNTER — Encounter (HOSPITAL_COMMUNITY): Payer: BLUE CROSS/BLUE SHIELD

## 2018-08-11 ENCOUNTER — Other Ambulatory Visit: Payer: Self-pay

## 2018-08-11 NOTE — Telephone Encounter (Signed)
I have placed referral to Dr Greer Ee at [facility] at fax [phone]. 13 pages of records. Phone [phone].

## 2018-08-13 ENCOUNTER — Encounter (HOSPITAL_COMMUNITY): Payer: BLUE CROSS/BLUE SHIELD

## 2018-08-13 ENCOUNTER — Ambulatory Visit (HOSPITAL_COMMUNITY): Payer: BLUE CROSS/BLUE SHIELD

## 2018-08-16 ENCOUNTER — Ambulatory Visit (HOSPITAL_COMMUNITY): Payer: BLUE CROSS/BLUE SHIELD

## 2018-08-16 ENCOUNTER — Encounter (HOSPITAL_COMMUNITY): Payer: BLUE CROSS/BLUE SHIELD

## 2018-08-17 ENCOUNTER — Telehealth: Payer: Self-pay | Admitting: Internal Medicine

## 2018-08-17 DIAGNOSIS — K529 Noninfective gastroenteritis and colitis, unspecified: Secondary | ICD-10-CM

## 2018-08-17 NOTE — Telephone Encounter (Signed)
The pt was suppose to decrease to 5 mg of prednisone today but he is no better and has had watery diarrhea every time he eats.  Had fecal incontinence in the middle of the night.  Takes lomotil about 2-4 times daily.  He says he does not take it regularly like prescribed.  He says it masks the problem and wants to know how to "fix" it.  Please advise

## 2018-08-17 NOTE — Discharge Summary (Signed)
Physician Discharge Summary  Patient ID: Jack [name] MRN: [id number] DOB/AGE: 05-25-1965 53 y.o.  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  Inflammatory bowel disease with history of Crohn's.  Chronic steroid dependence. Active Problems:   Schwannoma   Benign schwannoma   Discharged Condition: good  Hospital Course: Patient was admitted to undergo surgical extirpation of an intradural extra medullary lesion which he tolerated well.  He had increased dose of steroid given to cover the stress of surgery.  Consults: None  Significant Diagnostic Studies: None  Treatments: surgery: L3 laminectomy gross total excision of intradural extra medullary lesion.  Sent for pathology  Discharge Exam: Blood pressure 112/83, pulse 67, temperature 98.1 F (36.7 C), temperature source Oral, resp. rate 20, height 5' 7"  (1.702 m), weight 75.3 kg, SpO2 100 %. Station and gait are intact incision is clean and dry.  Pain control is good.  Disposition: Discharge home  Discharge Instructions    Call MD for:  redness, tenderness, or signs of infection (pain, swelling, redness, odor or green/yellow discharge around incision site)   Complete by:  As directed    Call MD for:  severe uncontrolled pain   Complete by:  As directed    Call MD for:  temperature >100.4   Complete by:  As directed    Diet - low sodium heart healthy   Complete by:  As directed    Discharge instructions   Complete by:  As directed    Okay to shower. Do not apply salves or appointments to incision. No heavy lifting with the upper extremities greater than 15 pounds. May resume driving when not requiring pain medication and patient feels comfortable with doing so.   Incentive spirometry RT   Complete by:  As directed    Increase  activity slowly   Complete by:  As directed      Allergies as of 08/10/2018      Reactions   Cephalexin Nausea And Vomiting   Other Reaction: GI UPSET   Duloxetine Other (See Comments)   HEADACHE       Medication List    TAKE these medications   acetaminophen 325 MG tablet Commonly known as:  TYLENOL Take 650 mg by mouth every 6 (six) hours as needed for mild pain or headache.   alendronate 70 MG tablet Commonly known as:  FOSAMAX Take 70 mg by mouth every Saturday.   atorvastatin 80 MG tablet Commonly known as:  LIPITOR Take 1 tablet (80 mg total) by mouth daily at 6 PM.   CALCIUM 1200 PO Take 1 capsule by mouth 2 (two) times daily.   carvedilol 6.25 MG tablet Commonly known as:  COREG Take 1 tablet (6.25 mg total) by mouth 2 (two) times daily.   clopidogrel 75 MG tablet Commonly known as:  PLAVIX Take 1 tablet (75 mg total) by mouth daily.   diazepam 5 MG tablet Commonly known as:  Valium Take 1 tablet (5 mg total) by mouth every 6 (six) hours as needed for muscle spasms.   diphenoxylate-atropine 2.5-0.025 MG tablet Commonly known as:  LOMOTIL Take 1-2 tablets by mouth 4 times daily as needed for diarrhea   Entyvio 300 MG injection Generic drug:  vedolizumab Inject 300 mg into the vein every 7 (seven) weeks.   ferrous sulfate 325 (65 FE) MG tablet Take 1 tablet (  325 mg total) by mouth daily with breakfast. What changed:  when to take this   furosemide 20 MG tablet Commonly known as:  LASIX Take only as needed What changed:    how much to take  how to take this  when to take this  reasons to take this   gabapentin 300 MG capsule Commonly known as:  NEURONTIN Take 1 capsule (300 mg total) by mouth 3 (three) times daily. Take one capsule TID What changed:    how much to take  when to take this  additional instructions   HYDROcodone-acetaminophen 5-325 MG tablet Commonly known as:  NORCO/VICODIN Take 1-2 tablets by mouth every 4 (four)  hours as needed for moderate pain or severe pain. What changed:    how much to take  when to take this  reasons to take this  additional instructions   hydrOXYzine 25 MG tablet Commonly known as:  ATARAX/VISTARIL TAKE 4 TABLETS (100 MG TOTAL) BY MOUTH AT BEDTIME.   Icosapent Ethyl 1 g Caps Commonly known as:  Vascepa Take 2 capsules (2 g total) by mouth 2 (two) times daily.   losartan 25 MG tablet Commonly known as:  COZAAR Take 25 mg by mouth daily.   multivitamin capsule Take 1 capsule by mouth daily.   nitroGLYCERIN 0.4 MG SL tablet Commonly known as:  Nitrostat Place 1 tablet (0.4 mg total) under the tongue every 5 (five) minutes as needed for chest pain.   pantoprazole 40 MG tablet Commonly known as:  Protonix Take 1 tablet (40 mg total) by mouth daily.   predniSONE 20 MG tablet Commonly known as:  DELTASONE 40 mg taper to baseline of 20 mg/day over 6 days What changed:    medication strength  how much to take  how to take this  when to take this  additional instructions   spironolactone 25 MG tablet Commonly known as:  ALDACTONE Take 0.5 tablets (12.5 mg total) by mouth daily.   tadalafil 5 MG tablet Commonly known as:  CIALIS TAKE ONE TABLET BY MOUTH DAILY AS NEEDED FOR ERECTILE DYSFUNCTION What changed:  See the new instructions.   tamsulosin 0.4 MG Caps capsule Commonly known as:  FLOMAX TAKE 1 CAPSULE (0.4 MG TOTAL) BY MOUTH DAILY.   warfarin 5 MG tablet Commonly known as:  COUMADIN Take 2.5-5 mg by mouth See admin instructions. Take 5 mg by mouth in the evening on Sun/Mon/Wed/Thurs/Fr/Sat and 2.5 mg on Tues (Note: PERMANENT STOP DATE IS 08/04/2018)        Signed: Blanchie Dessert Teana Lindahl 08/17/2018, 10:42 AM

## 2018-08-17 NOTE — Telephone Encounter (Signed)
Please clarify with him which dose of prednisone he was on; he has been on 20 mg chronically for quite some time for his pneumonitis I increased him from 20 mg to 40 mg daily and had plans to decrease by 5 mg every 7 days until back to 20 mg daily If diarrhea continues then I would stay at 40 mg daily for an additional 7 days then decrease to 35 mg x 7 days and so forth We also need to follow-up the Entyvio level which is long overdue from the lab, was drawn on 07/19/2018 as a miscellaneous lab.  Please see if you can find out what happened to this because Entyvio should be treating his Crohn's I also would repeat GI pathogen panel (especially after recent hospitalization)

## 2018-08-17 NOTE — Telephone Encounter (Signed)
The pt confirmed he is on 40 mg prednisone.  Pt has been advised to stay on 40 mg for 7 more days and decrease by 5 thereafter.  He will have his wife come in and pick up stool kit for GI path panel. Dr [name] I called the lab and was on hold for 15 min.  I will try again later to find out about the [facility] lab.

## 2018-08-17 NOTE — Telephone Encounter (Signed)
Pt's wife reported that today pt is to decrease dose of Prednisone to 5 mg, but pt is not feeling better.  Please advise.

## 2018-08-18 ENCOUNTER — Encounter: Payer: Self-pay | Admitting: *Deleted

## 2018-08-18 ENCOUNTER — Ambulatory Visit (HOSPITAL_COMMUNITY): Payer: BLUE CROSS/BLUE SHIELD

## 2018-08-18 ENCOUNTER — Encounter (HOSPITAL_COMMUNITY): Payer: BLUE CROSS/BLUE SHIELD

## 2018-08-18 NOTE — Telephone Encounter (Signed)
On hold for 20 min will call later

## 2018-08-19 ENCOUNTER — Other Ambulatory Visit: Payer: Self-pay

## 2018-08-19 ENCOUNTER — Ambulatory Visit (INDEPENDENT_AMBULATORY_CARE_PROVIDER_SITE_OTHER): Payer: BLUE CROSS/BLUE SHIELD | Admitting: Internal Medicine

## 2018-08-19 ENCOUNTER — Encounter: Payer: Self-pay | Admitting: Internal Medicine

## 2018-08-19 VITALS — Ht 67.0 in | Wt 165.0 lb

## 2018-08-19 DIAGNOSIS — Z79899 Other long term (current) drug therapy: Secondary | ICD-10-CM

## 2018-08-19 DIAGNOSIS — K50819 Crohn's disease of both small and large intestine with unspecified complications: Secondary | ICD-10-CM | POA: Diagnosis not present

## 2018-08-19 DIAGNOSIS — J82 Pulmonary eosinophilia, not elsewhere classified: Secondary | ICD-10-CM

## 2018-08-19 DIAGNOSIS — Z7952 Long term (current) use of systemic steroids: Secondary | ICD-10-CM

## 2018-08-19 DIAGNOSIS — K529 Noninfective gastroenteritis and colitis, unspecified: Secondary | ICD-10-CM

## 2018-08-19 DIAGNOSIS — J8281 Chronic eosinophilic pneumonia: Secondary | ICD-10-CM

## 2018-08-19 DIAGNOSIS — D84821 Immunodeficiency due to drugs: Secondary | ICD-10-CM

## 2018-08-19 NOTE — Patient Instructions (Addendum)
Your provider has requested that you go to the basement level for lab work. Press "B" on the elevator. The lab is located at the first door on the left as you exit the elevator.  Continue prednisone at 40 mg for the additional 7 days which would be 14 days total and then plan to reduce by 5 mg every 7 days until back to your chronic daily dose of 20 mg (20 mg daily dose was at direction of your rheumatologist at [facility] for pneumonitis)  Continue Lomotil 1 to 2 capsules 3-4 times a day as needed for now  Continue pantoprazole 40 mg daily.  You have been 09/13/18 at 930 am.  You have been scheduled for a CT eneterography scan of the abdomen and pelvis at [facility] ([address]---this is in the same building as Press [facility]).   You are scheduled on __________ at ____________. You should arrive at ____________ for registration. Please follow the written instructions below on the day of your exam:  WARNING: IF YOU ARE ALLERGIC TO IODINE/X-RAY DYE, PLEASE NOTIFY RADIOLOGY IMMEDIATELY AT [phone]! YOU WILL BE GIVEN A 13 HOUR PREMEDICATION PREP.  1) Do not eat or drink anything after _____________ (4 hours prior to your test)  You may take any medications as prescribed with a small amount of water, if necessary. If you take any of the following medications: METFORMIN, GLUCOPHAGE, [place], AVANDAMET, RIOMET, FORTAMET, ACTOPLUS MET, JANUMET, GLUMETZA or METAGLIP, you MAY be asked to HOLD this medication 48 hours AFTER the exam.  Plan on being at [facility] for 30 minutes or longer, depending on the type of exam you are having performed.  This test typically takes 30-45 minutes to complete.  If you have any questions regarding your exam or if you need to reschedule, you may call the CT department at [phone] between the hours of 8:00 am and 5:00 pm, Monday-Friday.

## 2018-08-19 NOTE — Addendum Note (Signed)
Addended by: Larina Bras on: 08/19/2018 02:20 PM   Modules accepted: Orders

## 2018-08-19 NOTE — Progress Notes (Signed)
Subjective:    Patient ID: Jack [name], male    DOB: Mar 22, 1966, 53 y.o.   MRN: [id number]  This service was provided via telemedicine.  Doximity app with AV communication. The patient was located at home The provider was located in provider's GI office. The patient did consent to this telephone visit and is aware of possible charges through their insurance for this visit.   The persons participating in this telemedicine service were the patient and I. Time spent on call: 20 minutes   HPI Jack [name] is a 53 year old male with a history of ileocolonic Crohn's disease, chronic eosinophilic pneumonitis/vasculitis on chronic prednisone, history of CAD with myocardial infarction and drug-eluting stent placed in November 2019 on Plavix, recent lumbar surgery for a schwannoma affecting the sciatic nerve, who is seen in follow-up.  Seen virtually today using a Doximity platform with A/V communication in the setting of COVID-19 pandemic.  I saw him last on 07/20/2018 and he subsequently had his lumbar surgery.  He continues to have issues with diarrhea but unfortunately with a higher dose of prednisone his diarrhea has yet to respond.  He is having 3-6 urgent liquid stool, predominantly brought on by eating.  He describes his stool as very loose and almost like "oatmeal".  He is using Lomotil 4 to 6 tablets/day to control the loose stools.  He also avoids eating to avoid the postprandial loose stools that interfere with his life and work.  He is not having abdominal pain or bleeding/blood in his stool/melena.  He reports he actually does not feel bad but the loose stools are certainly bothersome.  He did receive Entyvio on 07/20/2018 and his next dose is 09/09/2018.  He had an Entyvio level which never resulted it was drawn on 07/19/2018.  He had a very elevated fecal calprotectin on 07/16/2018 and his C. difficile and GI pathogen panel were negative at that time.  He has been on the 40 mg of prednisone  for over a week and he reports usually this would have caused his diarrhea to improve and resolve and he is surprised it has not helped.  He does take prednisone 20 mg chronically for his pneumonitis.  No fevers or chills.  No cough, chest pain or respiratory symptoms.  Of note he had a colonoscopy performed in January 2020 by Dr. [name] while he was in the hospital.  This did not show any evidence of active colitis at that time.  He had bleeding from an inflammatory type polyp in the rectum.  This occurred shortly after his hospitalization with myocardial infarction and PCI in the setting of antiplatelet therapy.  Review of Systems As per HPI, otherwise negative  Current Medications, Allergies, Past Medical History, Past Surgical History, Family History and Social History were reviewed in [employer] record.     Objective:   Physical Exam No PE, virtual visit   CBC    Component Value Date/Time   WBC 9.3 08/02/2018 1048   RBC 4.85 08/02/2018 1048   HGB 11.7 (L) 08/02/2018 1048   HGB 13.2 04/20/2018 0941   HCT 39.5 08/02/2018 1048   HCT 39.6 04/20/2018 0941   PLT 359 08/02/2018 1048   PLT 338 04/20/2018 0941   MCV 81.4 08/02/2018 1048   MCV 86 04/20/2018 0941   MCH 24.1 (L) 08/02/2018 1048   MCHC 29.6 (L) 08/02/2018 1048   RDW 17.3 (H) 08/02/2018 1048   RDW 12.1 04/20/2018 0941   LYMPHSABS 1.1 07/15/2018  1347   MONOABS 0.9 07/15/2018 1347   EOSABS 0.1 07/15/2018 1347   BASOSABS 0.1 07/15/2018 1347   CMP     Component Value Date/Time   NA 138 08/02/2018 1048   NA 138 04/28/2018 0910   K 4.3 08/02/2018 1048   CL 104 08/02/2018 1048   CO2 26 08/02/2018 1048   GLUCOSE 129 (H) 08/02/2018 1048   BUN 16 08/02/2018 1048   BUN 26 (H) 04/28/2018 0910   CREATININE 1.39 (H) 08/02/2018 1048   CALCIUM 8.6 (L) 08/02/2018 1048   PROT 6.5 04/07/2018 1533   ALBUMIN 3.8 04/07/2018 1533   AST 25 04/07/2018 1533   ALT 32 04/07/2018 1533   ALKPHOS 39 04/07/2018  1533   BILITOT 1.0 04/07/2018 1533   GFRNONAA 58 (L) 08/02/2018 1048   GFRAA >60 08/02/2018 1048   Fecal calprotectin greater than 2500  Iron/TIBC/Ferritin/ %Sat    Component Value Date/Time   IRON 26 (L) 07/19/2018 1230   FERRITIN 82.7 07/19/2018 1230   IRONPCTSAT 6.3 (L) 07/19/2018 1230       Assessment & Plan:  53 year old male with a history of ileocolonic Crohn's disease, chronic eosinophilic pneumonitis/vasculitis on chronic prednisone, history of CAD with myocardial infarction and drug-eluting stent placed in November 2019 on Plavix, recent lumbar surgery for a schwannoma affecting the sciatic nerve, who is seen in follow-up.   1.  Ileocolonic Crohn's disease with ongoing diarrhea --the question is the etiology of his diarrhea which is been bothersome for 2 or 3 months after having previously been under better control.  It is interesting that he did not have evidence of active colitis by colonoscopy in January but last month his fecal calprotectin is very high.  Calprotectin is a very sensitive and specific marker of IBD related inflammation in the gut.  Also unfortunate that he has not responded to higher dose prednisone to this point.  The Entyvio level that I had requested never resulted because I think it is important we know that he does not have Entyvio antibodies.  This test may need to be redrawn.  For now I have recommended the following --Repeat stool studies to include GI pathogen panel and fecal calprotectin --Continue prednisone at 40 mg for the additional 7 days which would be 14 days total and then plan to reduce by 5 mg every 7 days until back to his chronic daily dose of 20 mg (20 mg daily dose had been under the direction of his rheumatologist at [facility] for his pneumonitis) --Perform a CT enterography to evaluate the gut for evidence of active ileitis or colitis --Can continue Lomotil 1 to 2 capsules 3-4 times a day as needed for now  2.  GERD --controlled and we reduce  pantoprazole to once a day as of April 2020.  Continue pantoprazole 40 mg daily  3.  Eosinophilic pneumonitis --following with rheumatology at [facility]  4.  CAD with PCI --following with cardiology and remains on Plavix  Repeat virtual visit in 3 to 4 weeks

## 2018-08-20 ENCOUNTER — Ambulatory Visit (HOSPITAL_COMMUNITY): Payer: BLUE CROSS/BLUE SHIELD

## 2018-08-26 ENCOUNTER — Other Ambulatory Visit: Payer: BLUE CROSS/BLUE SHIELD

## 2018-08-26 ENCOUNTER — Telehealth (HOSPITAL_COMMUNITY): Payer: Self-pay

## 2018-08-26 DIAGNOSIS — J82 Pulmonary eosinophilia, not elsewhere classified: Secondary | ICD-10-CM | POA: Diagnosis not present

## 2018-08-26 DIAGNOSIS — K529 Noninfective gastroenteritis and colitis, unspecified: Secondary | ICD-10-CM

## 2018-08-26 DIAGNOSIS — K50819 Crohn's disease of both small and large intestine with unspecified complications: Secondary | ICD-10-CM | POA: Diagnosis not present

## 2018-08-26 DIAGNOSIS — J8281 Chronic eosinophilic pneumonia: Secondary | ICD-10-CM

## 2018-08-26 DIAGNOSIS — Z7952 Long term (current) use of systemic steroids: Secondary | ICD-10-CM

## 2018-08-26 DIAGNOSIS — Z79899 Other long term (current) drug therapy: Secondary | ICD-10-CM

## 2018-08-26 DIAGNOSIS — D84821 Immunodeficiency due to drugs: Secondary | ICD-10-CM

## 2018-08-26 NOTE — Telephone Encounter (Signed)
Phone call made to Pt to provide information about virtual cardiac rehab. Pt was responsive and wanted to move forward with virtual rehab. Pt stated he is two weeks post back operation and will be having a follow up soon. Pt stated he is able to walk for exercise and was encouraged to continue walking. Pt was informed he will receive a text message with instructions to set up the application.

## 2018-08-27 NOTE — Telephone Encounter (Signed)
Spoke with pts wife and she is aware also.

## 2018-08-27 NOTE — Telephone Encounter (Signed)
Per Dr. [name] pts Entyvio dose frequency needs to be changed to every 4 weeks. Order faxed to [facility] at [phone] and infusion center there will get the order to [place] at their office to get approval from the insurance company as well.

## 2018-08-30 ENCOUNTER — Other Ambulatory Visit: Payer: Self-pay

## 2018-08-30 ENCOUNTER — Ambulatory Visit (INDEPENDENT_AMBULATORY_CARE_PROVIDER_SITE_OTHER)
Admission: RE | Admit: 2018-08-30 | Discharge: 2018-08-30 | Disposition: A | Discharge status: Home / Self Care | Payer: BLUE CROSS/BLUE SHIELD | Source: Ambulatory Visit | Attending: Internal Medicine | Admitting: Internal Medicine

## 2018-08-30 DIAGNOSIS — K50819 Crohn's disease of both small and large intestine with unspecified complications: Secondary | ICD-10-CM | POA: Diagnosis not present

## 2018-08-30 DIAGNOSIS — Z7952 Long term (current) use of systemic steroids: Secondary | ICD-10-CM

## 2018-08-30 DIAGNOSIS — J8281 Chronic eosinophilic pneumonia: Secondary | ICD-10-CM

## 2018-08-30 DIAGNOSIS — K529 Noninfective gastroenteritis and colitis, unspecified: Secondary | ICD-10-CM | POA: Diagnosis not present

## 2018-08-30 DIAGNOSIS — R197 Diarrhea, unspecified: Secondary | ICD-10-CM | POA: Diagnosis not present

## 2018-08-30 DIAGNOSIS — Z79899 Other long term (current) drug therapy: Secondary | ICD-10-CM

## 2018-08-30 DIAGNOSIS — D84821 Immunodeficiency due to drugs: Secondary | ICD-10-CM

## 2018-08-30 MED ORDER — IOHEXOL 300 MG/ML  SOLN
100.0000 mL | Freq: Once | INTRAMUSCULAR | Status: AC | PRN
  Administered 2018-08-30: 100 mL via INTRAVENOUS

## 2018-08-31 ENCOUNTER — Telehealth (INDEPENDENT_AMBULATORY_CARE_PROVIDER_SITE_OTHER): Payer: BC Managed Care – PPO | Admitting: Family Medicine

## 2018-08-31 ENCOUNTER — Other Ambulatory Visit: Payer: BC Managed Care – PPO

## 2018-08-31 ENCOUNTER — Encounter: Payer: Self-pay | Admitting: Family Medicine

## 2018-08-31 VITALS — BP 136/88 | HR 66 | Ht 66.5 in | Wt 164.0 lb

## 2018-08-31 DIAGNOSIS — Z79899 Other long term (current) drug therapy: Secondary | ICD-10-CM | POA: Diagnosis not present

## 2018-08-31 DIAGNOSIS — D7218 Eosinophilia in diseases classified elsewhere: Secondary | ICD-10-CM

## 2018-08-31 DIAGNOSIS — I10 Essential (primary) hypertension: Secondary | ICD-10-CM

## 2018-08-31 DIAGNOSIS — M301 Polyarteritis with lung involvement [Churg-Strauss]: Secondary | ICD-10-CM

## 2018-08-31 DIAGNOSIS — N281 Cyst of kidney, acquired: Secondary | ICD-10-CM | POA: Insufficient documentation

## 2018-08-31 DIAGNOSIS — N289 Disorder of kidney and ureter, unspecified: Secondary | ICD-10-CM

## 2018-08-31 DIAGNOSIS — N183 Chronic kidney disease, stage 3 unspecified: Secondary | ICD-10-CM | POA: Insufficient documentation

## 2018-08-31 DIAGNOSIS — Z1283 Encounter for screening for malignant neoplasm of skin: Secondary | ICD-10-CM

## 2018-08-31 DIAGNOSIS — Z0000 Encounter for general adult medical examination without abnormal findings: Secondary | ICD-10-CM

## 2018-08-31 DIAGNOSIS — E785 Hyperlipidemia, unspecified: Secondary | ICD-10-CM

## 2018-08-31 DIAGNOSIS — Z955 Presence of coronary angioplasty implant and graft: Secondary | ICD-10-CM

## 2018-08-31 DIAGNOSIS — I255 Ischemic cardiomyopathy: Secondary | ICD-10-CM

## 2018-08-31 DIAGNOSIS — K5090 Crohn's disease, unspecified, without complications: Secondary | ICD-10-CM | POA: Diagnosis not present

## 2018-08-31 DIAGNOSIS — R972 Elevated prostate specific antigen [PSA]: Secondary | ICD-10-CM | POA: Diagnosis not present

## 2018-08-31 DIAGNOSIS — M818 Other osteoporosis without current pathological fracture: Secondary | ICD-10-CM

## 2018-08-31 DIAGNOSIS — D3610 Benign neoplasm of peripheral nerves and autonomic nervous system, unspecified: Secondary | ICD-10-CM

## 2018-08-31 DIAGNOSIS — K50811 Crohn's disease of both small and large intestine with rectal bleeding: Secondary | ICD-10-CM

## 2018-08-31 DIAGNOSIS — D497 Neoplasm of unspecified behavior of endocrine glands and other parts of nervous system: Secondary | ICD-10-CM

## 2018-08-31 HISTORY — DX: Cyst of kidney, acquired: N28.1

## 2018-08-31 LAB — GASTROINTESTINAL PATHOGEN PANEL PCR
C. difficile Tox A/B, PCR: NOT DETECTED
Campylobacter, PCR: NOT DETECTED
Cryptosporidium, PCR: NOT DETECTED
E coli (ETEC) LT/ST PCR: NOT DETECTED
E coli (STEC) stx1/stx2, PCR: NOT DETECTED
E coli 0157, PCR: NOT DETECTED
Giardia lamblia, PCR: NOT DETECTED
Norovirus, PCR: NOT DETECTED
Rotavirus A, PCR: NOT DETECTED
Salmonella, PCR: NOT DETECTED
Shigella, PCR: NOT DETECTED

## 2018-08-31 LAB — CALPROTECTIN, FECAL: Calprotectin, Fecal: 1254 ug/g — ABNORMAL HIGH (ref 0–120)

## 2018-08-31 NOTE — Assessment & Plan Note (Signed)
Sees [facility] rheum/pulm, upcoming appointments.

## 2018-08-31 NOTE — Assessment & Plan Note (Signed)
L3 laminectomy and schwannoma excision 07/2018 ([facility])

## 2018-08-31 NOTE — Assessment & Plan Note (Signed)
S/p L3 laminectomy and schwannoma excision 07/2018 ([facility]), recovering well

## 2018-08-31 NOTE — Progress Notes (Signed)
Virtual visit completed through [place]. Due to national recommendations of social distancing due to COVID-19, a virtual visit is felt to be most appropriate for this patient at this time. Reviewed limitations of a virtual visit.   Patient location: home Provider location: [place] at [facility], office If any vitals were documented, they were collected by patient at home unless specified below.    BP 136/88 (BP Location: Right Arm, Patient Position: Sitting)    Pulse 66    Ht 5' 6.5" (1.689 m)    Wt 164 lb (74.4 kg)    BMI 26.07 kg/m    CC: CPE Subjective:    Patient ID: Jack [name], male    DOB: 22-Nov-1965, 53 y.o.   MRN: [id number]  HPI: Jack [name] is a 53 y.o. male presenting on 08/31/2018 for Annual Exam (Wants to discuss CT scan. )   Wife manages his medications.   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 Sees [facility] rheumatology and pulmonology. Known crohn's on prednisone 25m daily as baseline.   Complicated year - suffered acute STEMI 01/2018 involving LAD s/p PCI with DES. Found to have LV mural thrombus treated with coumadin, pericarditis s/p colchicine treatment, and ischemic cardiomyopathy (sCHF). Discharged on DAPT (aspirin, plavix). Re-hospitalized with GI rectal bleed placed on PPI BID, aspirin stopped and coumadin restarted. Re-hospitalized 03/2018 with ongoing hematochezia found to have inflammatory rectal polyp.   Elevated PSA - plan was to closely monitor with DRE/PSA.   Recovering from recent lower back surgery 08/09/2018 ([facility]) - L3 laminectomy and gross total excision of intradural extramedullary L3 spinal lesion - pathology returned showing schwannoma. Notes increased pain as he's started working more - notes more trouble sleeping.   Now off coumadin since back surgery.   Recent incidentally noted R upper pole kidney lesion that has enlarged to 2.2cm by latest CT  enterography (by GI to follow Crohn's).   Preventative: COLONOSCOPY during hospitalization 01/2015 - active ileitis/colitis, f/u 2 yrs ([place]). Has established with Dr PHilarie FredricksonCOLONOSCOPY WITH PROPOFOL 04/09/2018 - inflammatory polyp, f/u left to primary GI - [facility] ([name] HEstill CottaIII, MD) Prostate cancer screening - see above  DEXA scan -11/2015 T -2.9 Osteoporosis. Takes calcium/vit D.  Flu shotyearly Tetanus shot -unsure Pneumovax 2017 Shingles shot -not due yet Seat belt use discussed Sunscreen usediscussed. No changing moles on skin. Wants to see derm, brother with h/o skin cancer. Requests referral.  Non smoker. Rare cigar.  Alcohol - one a day  Dentist q6 mo Eye exam as needed  Lives with wife, 1 dog. Grown children  Edu: college  Occ: owns mattress store  Activity: treadmill 5d/wk 30 min Diet: good water, fruits/vegetables daily     Relevant past medical, surgical, family and social history reviewed and updated as indicated. Interim medical history since our last visit reviewed. Allergies and medications reviewed and updated. Outpatient Medications Prior to Visit  Medication Sig Dispense Refill   acetaminophen (TYLENOL) 325 MG tablet Take 650 mg by mouth every 6 (six) hours as needed for mild pain or headache.      alendronate (FOSAMAX) 70 MG tablet Take 70 mg by mouth every Saturday.   11   atorvastatin (LIPITOR) 80 MG tablet Take 1 tablet (80 mg total) by mouth daily at 6 PM. 90 tablet 3   Calcium Carbonate-Vit D-Min (CALCIUM 1200 PO) Take 1 capsule by mouth 2 (two) times daily.     carvedilol (COREG) 6.25 MG  tablet Take 1 tablet (6.25 mg total) by mouth 2 (two) times daily. 180 tablet 3   clopidogrel (PLAVIX) 75 MG tablet Take 1 tablet (75 mg total) by mouth daily. 30 tablet 12   diazepam (VALIUM) 5 MG tablet Take 1 tablet (5 mg total) by mouth every 6 (six) hours as needed for muscle spasms. 30 tablet 0   diphenoxylate-atropine (LOMOTIL) 2.5-0.025 MG  tablet Take 1-2 tablets by mouth 4 times daily as needed for diarrhea 90 tablet 1   ferrous sulfate 325 (65 FE) MG tablet Take 1 tablet (325 mg total) by mouth daily with breakfast. (Patient taking differently: Take 325 mg by mouth at bedtime. ) 1 tablet 0   furosemide (LASIX) 20 MG tablet Take only as needed (Patient taking differently: Take 20 mg by mouth daily as needed for fluid. Take only as needed) 90 tablet 3   gabapentin (NEURONTIN) 300 MG capsule Take 1 capsule (300 mg total) by mouth 3 (three) times daily. Take one capsule TID (Patient taking differently: Take 300-600 mg by mouth See admin instructions. 300 mg in the morning, and 600 mg at bedtime) 270 capsule 1   HYDROcodone-acetaminophen (NORCO/VICODIN) 5-325 MG tablet Take 1-2 tablets by mouth every 4 (four) hours as needed for moderate pain or severe pain. 40 tablet 0   hydrOXYzine (ATARAX/VISTARIL) 25 MG tablet TAKE 4 TABLETS (100 MG TOTAL) BY MOUTH AT BEDTIME. 360 tablet 2   Icosapent Ethyl (VASCEPA) 1 g CAPS Take 2 capsules (2 g total) by mouth 2 (two) times daily. 120 capsule 11   losartan (COZAAR) 25 MG tablet Take 25 mg by mouth daily.     Multiple Vitamin (MULTIVITAMIN) capsule Take 1 capsule by mouth daily.     nitroGLYCERIN (NITROSTAT) 0.4 MG SL tablet Place 1 tablet (0.4 mg total) under the tongue every 5 (five) minutes as needed for chest pain. 25 tablet 12   pantoprazole (PROTONIX) 40 MG tablet Take 1 tablet (40 mg total) by mouth daily. 1 tablet 0   predniSONE (DELTASONE) 20 MG tablet 40 mg taper to baseline of 20 mg/day over 6 days 20 tablet 0   tadalafil (CIALIS) 5 MG tablet TAKE ONE TABLET BY MOUTH DAILY AS NEEDED FOR ERECTILE DYSFUNCTION (Patient taking differently: Take 5 mg by mouth daily as needed for erectile dysfunction. ) 20 tablet 5   tamsulosin (FLOMAX) 0.4 MG CAPS capsule TAKE 1 CAPSULE (0.4 MG TOTAL) BY MOUTH DAILY. 90 capsule 3   vedolizumab (ENTYVIO) 300 MG injection Inject 300 mg into the vein  every 7 (seven) weeks.      spironolactone (ALDACTONE) 25 MG tablet Take 0.5 tablets (12.5 mg total) by mouth daily. 90 tablet 3   Facility-Administered Medications Prior to Visit  Medication Dose Route Frequency Provider Last Rate Last Dose   ondansetron (ZOFRAN) 4 mg in sodium chloride 0.9 % 50 mL IVPB  4 mg Intravenous Q6H PRN Kristeen Miss, MD         Per HPI unless specifically indicated in ROS section below Review of Systems  Constitutional: Positive for appetite change (while on prednisone). Negative for activity change, chills, fatigue, fever and unexpected weight change.  HENT: Negative for hearing loss.   Eyes: Negative for visual disturbance.  Respiratory: Negative for cough, chest tightness, shortness of breath and wheezing.   Cardiovascular: Negative for chest pain, palpitations and leg swelling.  Gastrointestinal: Positive for diarrhea. Negative for abdominal distention, abdominal pain, blood in stool, constipation, nausea and vomiting.  Genitourinary: Negative for difficulty  urinating and hematuria.  Musculoskeletal: Negative for arthralgias, myalgias and neck pain.  Skin: Negative for rash.  Neurological: Negative for dizziness, seizures, syncope and headaches.  Hematological: Negative for adenopathy. Does not bruise/bleed easily.  Psychiatric/Behavioral: Negative for dysphoric mood. The patient is not nervous/anxious.    Objective:    BP 136/88 (BP Location: Right Arm, Patient Position: Sitting)    Pulse 66    Ht 5' 6.5" (1.689 m)    Wt 164 lb (74.4 kg)    BMI 26.07 kg/m   Wt Readings from Last 3 Encounters:  08/31/18 164 lb (74.4 kg)  08/19/18 165 lb (74.8 kg)  08/18/18 165 lb (74.8 kg)     Physical exam: Gen: alert, NAD, not ill appearing Pulm: speaks in complete sentences without increased work of breathing Psych: normal mood, normal thought content     Results for orders placed or performed during the hospital encounter of 08/09/18  PT- INR Day of  Surgery  Result Value Ref Range   Prothrombin Time 15.6 (H) 11.4 - 15.2 seconds   INR 1.3 (H) 0.8 - 1.2   Lab Results  Component Value Date   CREATININE 1.39 (H) 08/02/2018   BUN 16 08/02/2018   NA 138 08/02/2018   K 4.3 08/02/2018   CL 104 08/02/2018   CO2 26 08/02/2018    Assessment & Plan:   Problem List Items Addressed This Visit    Status post insertion of drug-eluting stent into left anterior descending (LAD) artery for coronary artery disease   Spinal cord tumor    L3 laminectomy and schwannoma excision 07/2018 ([facility])      Schwannoma    S/p L3 laminectomy and schwannoma excision 07/2018 ([facility]), recovering well      Renal insufficiency    Update labs today.       Relevant Orders   Renal function panel   Osteoporosis    Continue calcium, vit D, fosamax weekly.  Reviewed latest DEXA last year through [facility]      Lesion of right native kidney    Reviewed recent imaging with patient - he cannot complete MRI as he has spinal cord stimulator in place. Will proceed with dedicated renal US to further evaluate R kidney lesion.       Relevant Orders   US Renal   Ischemic cardiomyopathy   Hyperlipidemia    Improved control on atorvastatin 70m daily. Known CAD s/p MI and PCI with DES to LAD (late 2019)  The ASCVD Risk score (Goff DC Jr., et al., 2013) failed to calculate for the following reasons:   The patient has a prior MI or stroke diagnosis       Healthcare maintenance - Primary    Preventative protocols reviewed and updated unless pt declined. Discussed healthy diet and lifestyle.       Essential hypertension    Chronic, stable. Continue current regimen.       Elevated PSA measurement    H/o this, latest PSA had dropped from 6.6 --> 4.9. last DRE with enlarged prostate noted. Plan was to follow more closely with DRE/PSA however with Covid19 and change to virtual visit we are unable to perform DRE today. Will update free/total PSA and if staying elevated  discussed low threshold to refer to urology. Pt agrees.       Relevant Orders   PSA, Total with Reflex to PSA, Free   Cro[place] disease [place]    Appreciate GI care ([facility]) Entyvio frequency has recently been increased to  Q4wks.      Churg-Strauss syndrome with lung involvement ([place])    Sees [facility] rheum/pulm, upcoming appointments.        Other Visit Diagnoses    Skin cancer screening       Relevant Orders   Ambulatory referral to Dermatology       No orders of the defined types were placed in this encounter.  Orders Placed This Encounter  Procedures   US Renal    Standing Status:   Future    Standing Expiration Date:   10/31/2019    Order Specific Question:   Reason for Exam (SYMPTOM  OR DIAGNOSIS REQUIRED)    Answer:   f/u kidney lesion R side    Order Specific Question:   Preferred imaging location?    Answer:   [facility]   Renal function panel    Standing Status:   Future    Standing Expiration Date:   08/31/2019   PSA, Total with Reflex to PSA, Free    Standing Status:   Future    Standing Expiration Date:   08/31/2019   Ambulatory referral to Dermatology    Referral Priority:   Routine    Referral Type:   Consultation    Referral Reason:   Specialty Services Required    Requested Specialty:   Dermatology    Number of Visits Requested:   1    I discussed the assessment and treatment plan with the patient. The patient was provided an opportunity to ask questions and all were answered. The patient agreed with the plan and demonstrated an understanding of the instructions. The patient was advised to call back or seek an in-person evaluation if the symptoms worsen or if the condition fails to improve as anticipated.  Follow up plan: Return in about 6 months (around 03/02/2019) for follow up visit.  [name], MD

## 2018-08-31 NOTE — Assessment & Plan Note (Deleted)
S/p L3 laminectomy and schwannoma excision 07/2018 ([facility]), recovering well

## 2018-08-31 NOTE — Assessment & Plan Note (Signed)
Chronic, stable. Continue current regimen. 

## 2018-08-31 NOTE — Assessment & Plan Note (Addendum)
Continue calcium, vit D, fosamax weekly.  Reviewed latest DEXA last year through [facility]

## 2018-08-31 NOTE — Assessment & Plan Note (Addendum)
Improved control on atorvastatin 72m daily. Known CAD s/p MI and PCI with DES to LAD (late 2019)  The ASCVD Risk score (Goff DC Jr., et al., 2013) failed to calculate for the following reasons:   The patient has a prior MI or stroke diagnosis

## 2018-08-31 NOTE — Assessment & Plan Note (Signed)
H/o this, latest PSA had dropped from 6.6 --> 4.9. last DRE with enlarged prostate noted. Plan was to follow more closely with DRE/PSA however with Covid19 and change to virtual visit we are unable to perform DRE today. Will update free/total PSA and if staying elevated discussed low threshold to refer to urology. Pt agrees.

## 2018-08-31 NOTE — Assessment & Plan Note (Addendum)
Reviewed recent imaging with patient - he cannot complete MRI as he has spinal cord stimulator in place. Will proceed with dedicated renal US to further evaluate R kidney lesion.

## 2018-08-31 NOTE — Assessment & Plan Note (Signed)
Preventative protocols reviewed and updated unless pt declined. Discussed healthy diet and lifestyle.  

## 2018-08-31 NOTE — Assessment & Plan Note (Addendum)
Appreciate GI care ([facility]) Entyvio frequency has recently been increased to Q4wks.

## 2018-08-31 NOTE — Assessment & Plan Note (Signed)
Update labs today.

## 2018-09-03 ENCOUNTER — Other Ambulatory Visit: Payer: Self-pay | Admitting: Radiation Therapy

## 2018-09-03 DIAGNOSIS — K5090 Crohn's disease, unspecified, without complications: Secondary | ICD-10-CM | POA: Diagnosis not present

## 2018-09-10 ENCOUNTER — Other Ambulatory Visit: Payer: Self-pay

## 2018-09-10 ENCOUNTER — Encounter: Payer: Self-pay | Admitting: *Deleted

## 2018-09-10 MED ORDER — LOSARTAN POTASSIUM 25 MG PO TABS: 25.0000 mg | ORAL_TABLET | Freq: Every day | ORAL | 3 refills | Status: DC

## 2018-09-11 ENCOUNTER — Other Ambulatory Visit: Payer: Self-pay | Admitting: Family Medicine

## 2018-09-13 ENCOUNTER — Encounter: Payer: Self-pay | Admitting: Internal Medicine

## 2018-09-13 ENCOUNTER — Ambulatory Visit (INDEPENDENT_AMBULATORY_CARE_PROVIDER_SITE_OTHER): Payer: BC Managed Care – PPO | Admitting: Internal Medicine

## 2018-09-13 ENCOUNTER — Other Ambulatory Visit: Payer: Self-pay

## 2018-09-13 VITALS — Ht 66.5 in | Wt 164.0 lb

## 2018-09-13 DIAGNOSIS — Z7952 Long term (current) use of systemic steroids: Secondary | ICD-10-CM | POA: Diagnosis not present

## 2018-09-13 DIAGNOSIS — Z79899 Other long term (current) drug therapy: Secondary | ICD-10-CM

## 2018-09-13 DIAGNOSIS — K529 Noninfective gastroenteritis and colitis, unspecified: Secondary | ICD-10-CM

## 2018-09-13 DIAGNOSIS — J8281 Chronic eosinophilic pneumonia: Secondary | ICD-10-CM

## 2018-09-13 DIAGNOSIS — K50819 Crohn's disease of both small and large intestine with unspecified complications: Secondary | ICD-10-CM | POA: Diagnosis not present

## 2018-09-13 DIAGNOSIS — J82 Pulmonary eosinophilia, not elsewhere classified: Secondary | ICD-10-CM

## 2018-09-13 MED ORDER — CHOLESTYRAMINE 4 G PO PACK: 4.0000 g | PACK | Freq: Two times a day (BID) | ORAL | 2 refills | Status: DC

## 2018-09-13 NOTE — Progress Notes (Signed)
Subjective:    Patient ID: Jack [name], male    DOB: 01/19/1966, 53 y.o.   MRN: [id number]  This service was provided via telemedicine.  [employer] app with A/V communication The patient was located at home The provider was located in provider's GI office. The patient did consent to this telephone visit and is aware of possible charges through their insurance for this visit.   The persons participating in this telemedicine service were the patient and I. Time spent on call: 10 min   HPI Ephrem Carrick is a 53 year old male with a history of ileocolonic Crohn's disease, chronic eosinophilic pneumonitis/vasculitis on chronic prednisone, history of CAD with MI and drug-eluting stent placed in November 2019 on Plavix, history of schwannoma with compression of sciatic nerve status post lumbar surgery in May 2020, who is seen for follow-up.  Last seen in the office on 08/19/2018.  At last visit we continued his prednisone taper, ordered a CT enterography.  We also eventually when finding Entyvio level was slightly subtherapeutic increased Entyvio interval to every 4 weeks.  CT enterography performed on 08/30/2018 showed hyperemia within the distal ileum suspicious for mild active enteritis.  There was rectal mucosal hyperenhancement for which active colitis could not be excluded.  He has continued to struggle with diarrhea on a daily basis.  He is having most of his bowel movements in the morning 3-4 each morning and then using Lomotil usually 6 to 8 tablets/day.  He does not feel bad and does not have abdominal pain.  No blood in his stool.  Food triggers the diarrhea.  He tries to avoid eating lunch just so that he does not have diarrhea.  Often during dinner he will have to leave the table to have a bowel movement.  He does have nocturnal stools and and can have accidents at night because when he wakes up he has to go so urgently.  He received Entyvio on 09/03/2018 but that was the normal 8-week  dose, his next dose is scheduled at 4-week intervals.  He is back to prednisone 20 mg a day.  We reviewed the CT enterography which was done a couple weeks ago.  Review of Systems As per HPI, otherwise negative  Current Medications, Allergies, Past Medical History, Past Surgical History, Family History and Social History were reviewed in [employer] record.     Objective:   Physical Exam Ht 5' 6.5" (1.689 m)   Wt 164 lb (74.4 kg)   BMI 26.07 kg/m  No physical exam, virtual visit  CBC    Component Value Date/Time   WBC 9.3 08/02/2018 1048   RBC 4.85 08/02/2018 1048   HGB 11.7 (L) 08/02/2018 1048   HGB 13.2 04/20/2018 0941   HCT 39.5 08/02/2018 1048   HCT 39.6 04/20/2018 0941   PLT 359 08/02/2018 1048   PLT 338 04/20/2018 0941   MCV 81.4 08/02/2018 1048   MCV 86 04/20/2018 0941   MCH 24.1 (L) 08/02/2018 1048   MCHC 29.6 (L) 08/02/2018 1048   RDW 17.3 (H) 08/02/2018 1048   RDW 12.1 04/20/2018 0941   LYMPHSABS 1.1 07/15/2018 1347   MONOABS 0.9 07/15/2018 1347   EOSABS 0.1 07/15/2018 1347   BASOSABS 0.1 07/15/2018 1347   CMP     Component Value Date/Time   NA 138 08/02/2018 1048   NA 138 04/28/2018 0910   K 4.3 08/02/2018 1048   CL 104 08/02/2018 1048   CO2 26 08/02/2018 1048  GLUCOSE 129 (H) 08/02/2018 1048   BUN 16 08/02/2018 1048   BUN 26 (H) 04/28/2018 0910   CREATININE 1.39 (H) 08/02/2018 1048   CALCIUM 8.6 (L) 08/02/2018 1048   PROT 6.5 04/07/2018 1533   ALBUMIN 3.8 04/07/2018 1533   AST 25 04/07/2018 1533   ALT 32 04/07/2018 1533   ALKPHOS 39 04/07/2018 1533   BILITOT 1.0 04/07/2018 1533   GFRNONAA 58 (L) 08/02/2018 1048   GFRAA >60 08/02/2018 1048   GI pathogen panel -08/26/2018  Fecal calprotectin 1254 08/26/2018, 2580 on 07/16/2018  Entyvio level 11.9 in April 2020, no antibodies   CT ABDOMEN AND PELVIS WITH CONTRAST (ENTEROGRAPHY)   TECHNIQUE: Multidetector CT of the abdomen and pelvis during bolus administration of  intravenous contrast. Negative oral contrast was given.   CONTRAST:  135m OMNIPAQUE IOHEXOL 300 MG/ML  SOLN   COMPARISON:  09/10/2016   FINDINGS: Lower chest: Clear lung bases. Normal heart size without pericardial or pleural effusion.   Hepatobiliary: Hepatic cysts and bile duct hamartomas, similar in size and distribution. Possible mild hepatic steatosis. Cholecystectomy, without biliary ductal dilatation.   Pancreas: Normal, without mass or ductal dilatation.   Spleen: Normal in size, without focal abnormality.   Adrenals/Urinary Tract: Normal adrenal glands. Bilateral too small to characterize renal lesion. 2.2 cm upper pole right renal well-circumscribed low-density lesion which is slightly greater than fluid density and enlarged from 1.1 cm on the prior.   No hydronephrosis.  Normal urinary bladder.   Stomach/Bowel: Normal stomach, without wall thickening. Subtle mucosal hyperenhancement suspected within the upper rectum, including on image 174/5. No colonic wall thickening.   The extent of small bowel distension with neutral contrast is moderate. The terminal ileum is normal, including on coronal image 30/8. Areas of mucosal hyperenhancement are identified within the distal ileum, including on coronal images 33 and 39. Transverse images 126 and 114/5. No significant wall thickening or perienteric edema.   No stricture, bowel obstruction.   Vascular/Lymphatic: Aortic and branch vessel atherosclerosis. No abdominopelvic adenopathy.   Reproductive: Normal prostate.   Other: No significant free fluid.  Dorsal spinal stimulator.   Musculoskeletal: No acute osseous abnormality.   IMPRESSION: 1. Hyperemia within the distal ileum, which given the clinical history, is suspicious for mild active enteritis. 2. Rectal mucosal hyperenhancement for which active colitis cannot be excluded. 3. No acute complication. 4. Aortic Atherosclerosis (ICD10-I70.0). This is mildly  age advanced. 5. Upper pole right renal lesion which measures slightly greater than fluid density and has enlarged compared to 09/10/2016. This could either may be more definitively characterized with pre and post contrast abdominal MRI or followed with renal ultrasound to confirm ongoing size stability.     Electronically Signed   By: KAbigail MiyamotoM.D.   On: 08/30/2018 14:47       Assessment & Plan:  53year old male with a history of ileocolonic Crohn's disease, chronic eosinophilic pneumonitis/vasculitis on chronic prednisone, history of CAD with MI and drug-eluting stent placed in November 2019 on Plavix, history of schwannoma with compression of sciatic nerve status post lumbar surgery in May 2020, who is seen for follow-up.  1.  Ileocolonic Crohn's with ongoing diarrhea --Entyvio level slightly subtherapeutic though fortunately no antibodies.  Higher dose prednisone did not help much with his diarrhea.  His infectious panel is negative.  His fecal calprotectin is declining but remains quite elevated.  I am hopeful that increasing Entyvio frequency will help bring the disease under better control.  We will plan as  follows: --Entyvio on board with plans to re-dose every 4 weeks --Continue chronic prednisone at 20 mg which is his usual dose per his rheumatologist for his pneumonitis --Continue Lomotil 1 to 2 tablets 4 times daily as needed --Add cholestyramine 4 g daily; he is advised to separate this by 2 hours on either side of any other oral medications --I sent this prescription for him so that he could get it quickly --Office follow-up with me in 1 month which will be just after his frequent dose of Entyvio  2.  GERD --controlled and continue pantoprazole 40 mg daily.  3.  Eosinophilic pneumonitis --following with Dr. Nicola Girt at [facility] rheumatology

## 2018-09-13 NOTE — Patient Instructions (Addendum)
If you are age 51 or older, your body mass index should be between 23-30. Your Body mass index is 26.07 kg/m. If this is out of the aforementioned range listed, please consider follow up with your Primary Care Provider.  If you are age 19 or younger, your body mass index should be between 19-25. Your Body mass index is 26.07 kg/m. If this is out of the aformentioned range listed, please consider follow up with your Primary Care Provider.   Continue prednisone at 20 mg daily.  Continue Lomotil 1 to 2 tablets 4 times daily as needed  We have sent the following medications to your pharmacy for you to pick up at your convenience: cholestyramine 4 g daily; separate this by 2 hours on either side of any other oral medications  Office follow-up with me Tuesday, 10/19/2018 @9 :00 am  Continue pantoprazole 40 mg daily.

## 2018-09-14 ENCOUNTER — Other Ambulatory Visit: Payer: BC Managed Care – PPO

## 2018-09-14 ENCOUNTER — Other Ambulatory Visit: Payer: Self-pay | Admitting: Family Medicine

## 2018-09-15 ENCOUNTER — Telehealth: Payer: Self-pay | Admitting: Internal Medicine

## 2018-09-16 NOTE — Telephone Encounter (Signed)
I called and spoke with his wife.  He is doing much better this am.  The cholestyramine is starting to help. They will call back if this does not help

## 2018-09-18 DIAGNOSIS — M79604 Pain in right leg: Secondary | ICD-10-CM | POA: Diagnosis not present

## 2018-09-18 DIAGNOSIS — I10 Essential (primary) hypertension: Secondary | ICD-10-CM | POA: Diagnosis not present

## 2018-09-18 DIAGNOSIS — I776 Arteritis, unspecified: Secondary | ICD-10-CM | POA: Diagnosis not present

## 2018-09-18 DIAGNOSIS — I252 Old myocardial infarction: Secondary | ICD-10-CM | POA: Diagnosis not present

## 2018-09-22 DIAGNOSIS — D721 Eosinophilia: Secondary | ICD-10-CM | POA: Diagnosis not present

## 2018-09-22 DIAGNOSIS — J4541 Moderate persistent asthma with (acute) exacerbation: Secondary | ICD-10-CM | POA: Diagnosis not present

## 2018-09-22 DIAGNOSIS — Z6826 Body mass index (BMI) 26.0-26.9, adult: Secondary | ICD-10-CM | POA: Diagnosis not present

## 2018-09-22 DIAGNOSIS — J310 Chronic rhinitis: Secondary | ICD-10-CM | POA: Diagnosis not present

## 2018-10-04 DIAGNOSIS — Z79899 Other long term (current) drug therapy: Secondary | ICD-10-CM | POA: Diagnosis not present

## 2018-10-04 DIAGNOSIS — K5090 Crohn's disease, unspecified, without complications: Secondary | ICD-10-CM | POA: Diagnosis not present

## 2018-10-05 NOTE — Progress Notes (Signed)
Discharge Progress Report  Patient Details  Name: Jack [name] MRN: [id number] Date of Birth: 1965-09-04 Referring Provider:     CARDIAC REHAB PHASE II ORIENTATION from 05/11/2018 in [place]  Referring Provider  Dr. Tamala Julian       Number of Visits: 7  Reason for Discharge:  Early Exit:  unable to contact pt.  Smoking History:  Social History   Tobacco Use  Smoking Status Former Smoker  . Types: Cigars  . Quit date: 02/20/2018  . Years since quitting: 0.6  Smokeless Tobacco Never Used    Diagnosis:  02/20/18 DES LAD  02/20/18 STEMI  ADL UCSD:   Initial Exercise Prescription: Initial Exercise Prescription - 05/11/18 1000      Date of Initial Exercise RX and Referring Provider   Date  05/11/18    Referring Provider  Dr. Tamala Julian    Expected Discharge Date  08/16/18      Treadmill   MPH  3.8    Grade  2    Minutes  10      Bike   Level  1.5    Minutes  10    METs  4.54      Elliptical   Level  2    Speed  2    Minutes  10    METs  4.5      Prescription Details   Frequency (times per week)  3    Duration  Progress to 30 minutes of continuous aerobic without signs/symptoms of physical distress      Intensity   THRR 40-80% of Max Heartrate  68-136    Ratings of Perceived Exertion  11-13      Progression   Progression  Continue to progress workloads to maintain intensity without signs/symptoms of physical distress.      Resistance Training   Training Prescription  Yes    Weight  4 lbs.     Reps  10-15       Discharge Exercise Prescription (Final Exercise Prescription Changes): Exercise Prescription Changes - 06/04/18 0924      Response to Exercise   Blood Pressure (Admit)  110/70    Blood Pressure (Exercise)  128/72    Blood Pressure (Exit)  105/63    Heart Rate (Admit)  69 bpm    Heart Rate (Exercise)  125 bpm    Heart Rate (Exit)  68 bpm    Rating of Perceived Exertion (Exercise)  14    Symptoms  Light  headed and dizzy.    Duration  Continue with 30 min of aerobic exercise without signs/symptoms of physical distress.    Intensity  THRR unchanged      Progression   Progression  Continue to progress workloads to maintain intensity without signs/symptoms of physical distress.    Average METs  5.2      Resistance Training   Training Prescription  No      Treadmill   MPH  3.8    Grade  2    Minutes  10      Bike   Level  2    Minutes  10    METs  4.68      Rower   Level  3    Watts  57    Minutes  10    METs  6       Functional Capacity: 6 Minute Walk    Row Name 05/11/18 1017  6 Minute Walk   Phase  Initial     Distance  1800 feet     Walk Time  6 minutes     # of Rest Breaks  0     MPH  3.4     METS  4.7     RPE  12     VO2 Peak  16.61     Symptoms  No     Resting HR  70 bpm     Resting BP  102/60     Resting Oxygen Saturation   100 %     Exercise Oxygen Saturation  during 6 min walk  100 %     Max Ex. HR  93 bpm     Max Ex. BP  122/68     2 Minute Post BP  114/70        Psychological, QOL, Others - Outcomes: PHQ 2/9: Depression screen [facility] 2/9 05/19/2018 03/20/2017  Decreased Interest 0 0  Down, Depressed, Hopeless 0 0  PHQ - 2 Score 0 0  Some recent data might be hidden    Quality of Life: Quality of Life - 05/11/18 1019      Quality of Life   Select  Quality of Life      Quality of Life Scores   Health/Function Pre  22.47 %    Socioeconomic Pre  27.69 %    Psych/Spiritual Pre  26.36 %    Family Pre  26.4 %    GLOBAL Pre  25 %       Personal Goals: Goals established at orientation with interventions provided to work toward goal. Personal Goals and Risk Factors at Admission - 05/11/18 1205      Core Components/Risk Factors/Patient Goals on Admission    Weight Management  Yes;Weight Maintenance    Intervention  Weight Management: Develop a combined nutrition and exercise program designed to reach desired caloric intake, while  maintaining appropriate intake of nutrient and fiber, sodium and fats, and appropriate energy expenditure required for the weight goal.;Weight Management: Provide education and appropriate resources to help participant work on and attain dietary goals.    Admit Weight  174 lb 2.6 oz (79 kg)    Expected Outcomes  Short Term: Continue to assess and modify interventions until short term weight is achieved;Long Term: Adherence to nutrition and physical activity/exercise program aimed toward attainment of established weight goal;Weight Maintenance: Understanding of the daily nutrition guidelines, which includes 25-35% calories from fat, 7% or less cal from saturated fats, less than 257m cholesterol, less than 1.5gm of sodium, & 5 or more servings of fruits and vegetables daily;Understanding recommendations for meals to include 15-35% energy as protein, 25-35% energy from fat, 35-60% energy from carbohydrates, less than 2033mof dietary cholesterol, 20-35 gm of total fiber daily;Understanding of distribution of calorie intake throughout the day with the consumption of 4-5 meals/snacks    Heart Failure  Yes    Intervention  Provide a combined exercise and nutrition program that is supplemented with education, support and counseling about heart failure. Directed toward relieving symptoms such as shortness of breath, decreased exercise tolerance, and extremity edema.    Expected Outcomes  Improve functional capacity of life;Short term: Attendance in program 2-3 days a week with increased exercise capacity. Reported lower sodium intake. Reported increased fruit and vegetable intake. Reports medication compliance.;Short term: Daily weights obtained and reported for increase. Utilizing diuretic protocols set by physician.;Long term: Adoption of self-care skills and reduction of barriers  for early signs and symptoms recognition and intervention leading to self-care maintenance.    Hypertension  Yes    Intervention   Provide education on lifestyle modifcations including regular physical activity/exercise, weight management, moderate sodium restriction and increased consumption of fresh fruit, vegetables, and low fat dairy, alcohol moderation, and smoking cessation.;Monitor prescription use compliance.    Expected Outcomes  Short Term: Continued assessment and intervention until BP is < 140/20m HG in hypertensive participants. < 130/828mHG in hypertensive participants with diabetes, heart failure or chronic kidney disease.;Long Term: Maintenance of blood pressure at goal levels.    Lipids  Yes    Intervention  Provide education and support for participant on nutrition & aerobic/resistive exercise along with prescribed medications to achieve LDL <7061mHDL >82m90m  Expected Outcomes  Short Term: Participant states understanding of desired cholesterol values and is compliant with medications prescribed. Participant is following exercise prescription and nutrition guidelines.;Long Term: Cholesterol controlled with medications as prescribed, with individualized exercise RX and with personalized nutrition plan. Value goals: LDL < 70mg58mL > 40 mg.    Stress  Yes    Intervention  Offer individual and/or small group education and counseling on adjustment to heart disease, stress management and health-related lifestyle change. Teach and support self-help strategies.;Refer participants experiencing significant psychosocial distress to appropriate mental health specialists for further evaluation and treatment. When possible, include family members and significant others in education/counseling sessions.    Expected Outcomes  Short Term: Participant demonstrates changes in health-related behavior, relaxation and other stress management skills, ability to obtain effective social support, and compliance with psychotropic medications if prescribed.;Long Term: Emotional wellbeing is indicated by absence of clinically significant  psychosocial distress or social isolation.        Personal Goals Discharge: Goals and Risk Factor Review    Row Name 05/19/18 0938 [phone]        Core Components/Risk Factors/Patient Goals Review   Personal Goals Review  Weight Management/Obesity;Heart Failure;Hypertension;Lipids;Stress       Review  pt with multiple CAD RF demonstrates eagerness to participate in CR program. pt personal goals are to increase strength/stamina and decrease fatigue.  pt ultimately would like to run [id number].        Expected Outcomes  pt will participate in CR exercise, nutrition and lifestyle modification to decrease overall CAD RF.           Exercise Goals and Review: Exercise Goals    Row Name 05/11/18 0905             Exercise Goals   Increase Physical Activity  Yes       Intervention  Provide advice, education, support and counseling about physical activity/exercise needs.;Develop an individualized exercise prescription for aerobic and resistive training based on initial evaluation findings, risk stratification, comorbidities and participant's personal goals.       Expected Outcomes  Short Term: Attend rehab on a regular basis to increase amount of physical activity.       Increase Strength and Stamina  Yes       Intervention  Provide advice, education, support and counseling about physical activity/exercise needs.;Develop an individualized exercise prescription for aerobic and resistive training based on initial evaluation findings, risk stratification, comorbidities and participant's personal goals.       Expected Outcomes  Short Term: Increase workloads from initial exercise prescription for resistance, speed, and METs.       Able to understand and use rate of perceived  exertion (RPE) scale  Yes       Intervention  Provide education and explanation on how to use RPE scale       Expected Outcomes  Short Term: Able to use RPE daily in rehab to express subjective intensity level;Long Term:  Able  to use RPE to guide intensity level when exercising independently       Knowledge and understanding of Target Heart Rate Range (THRR)  Yes       Intervention  Provide education and explanation of THRR including how the numbers were predicted and where they are located for reference       Expected Outcomes  Short Term: Able to state/look up THRR;Long Term: Able to use THRR to govern intensity when exercising independently;Short Term: Able to use daily as guideline for intensity in rehab       Able to check pulse independently  Yes       Intervention  Provide education and demonstration on how to check pulse in carotid and radial arteries.;Review the importance of being able to check your own pulse for safety during independent exercise       Expected Outcomes  Short Term: Able to explain why pulse checking is important during independent exercise;Long Term: Able to check pulse independently and accurately       Understanding of Exercise Prescription  Yes       Intervention  Provide education, explanation, and written materials on patient's individual exercise prescription       Expected Outcomes  Short Term: Able to explain program exercise prescription;Long Term: Able to explain home exercise prescription to exercise independently          Exercise Goals Re-Evaluation: Exercise Goals Re-Evaluation    Row Name 05/19/18 1004 06/01/18 1419 06/17/18 0926         Exercise Goal Re-Evaluation   Exercise Goals Review  Increase Physical Activity;Increase Strength and Stamina;Able to understand and use rate of perceived exertion (RPE) scale;Knowledge and understanding of Target Heart Rate Range (THRR);Understanding of Exercise Prescription  Increase Physical Activity;Increase Strength and Stamina;Able to understand and use rate of perceived exertion (RPE) scale;Knowledge and understanding of Target Heart Rate Range (THRR);Understanding of Exercise Prescription  Increase Physical Activity;Increase Strength  and Stamina;Able to understand and use rate of perceived exertion (RPE) scale;Knowledge and understanding of Target Heart Rate Range (THRR);Understanding of Exercise Prescription     Comments  Pt first day of exercise. Pt tolerated exercise well and understands RPE scale and exercise Rx. Will progress pt as tolerated.   Reviewed METs and goals with Pt. Pt MET level is 4.5. Pt is tolerating exercise well and has a goal to join [employer]. Pt is not currently exercising at home but gets physical activity at work.   Unable to review METs and goals with Pt due to closure of Cardiac Rehab for [place]. Pt MET level is 5.2 and is progressing well. Pt is tolerating exercise Rx well.      Expected Outcomes  Will continue to monitor and progress Pt as tolerated.   Will continue to monitor and progress Pt as tolerated.   Will continue to monitor and progress Pt as tolerated.         Nutrition & Weight - Outcomes: Pre Biometrics - 05/11/18 1018      Pre Biometrics   Height  5' 7"  (1.702 m)    Weight  79 kg    Waist Circumference  37.5 inches    Hip Circumference  42  inches    Waist to Hip Ratio  0.89 %    BMI (Calculated)  27.27    Triceps Skinfold  25 mm    % Body Fat  27.8 %    Grip Strength  43 kg    Flexibility  0 in    Single Leg Stand  30 seconds        Nutrition: Nutrition Therapy & Goals - 05/12/18 0959      Nutrition Therapy   Diet  heart healthy      Personal Nutrition Goals   Nutrition Goal  Pt to identify food quantities necessary to achieve weight loss of 6-24 lb at graduation from cardiac rehab.     Personal Goal #2  Pt to build a healthy plate including vegetables, fruits, whole grains, and low-fat dairy products in a heart healthy meal plan.    Personal Goal #3  Pt to weigh and measure serving sizes    Personal Goal #4  Pt to eat a variety of non-starchy vegetables      Intervention Plan   Intervention  Prescribe, educate and counsel regarding individualized specific  dietary modifications aiming towards targeted core components such as weight, hypertension, lipid management, diabetes, heart failure and other comorbidities.    Expected Outcomes  Short Term Goal: Understand basic principles of dietary content, such as calories, fat, sodium, cholesterol and nutrients.;Long Term Goal: Adherence to prescribed nutrition plan.       Nutrition Discharge: Nutrition Assessments - 05/12/18 1000      MEDFICTS Scores   Pre Score  21       Education Questionnaire Score: Knowledge Questionnaire Score - 05/11/18 0902      Knowledge Questionnaire Score   Pre Score  23/24       Goals reviewed with patient; copy given to patient.

## 2018-10-05 NOTE — Addendum Note (Signed)
Encounter addended by: Noel Christmas, RN on: 10/05/2018 10:59 AM  Actions taken: Clinical Note Signed

## 2018-10-12 ENCOUNTER — Telehealth: Payer: Self-pay | Admitting: *Deleted

## 2018-10-12 NOTE — Telephone Encounter (Signed)
Called lmtcb. Notes states he was scheduled for back surgery in may.

## 2018-10-12 NOTE — Telephone Encounter (Signed)
-----   Message from Loren Racer, LPN sent at 03/31/313  8:59 AM EDT ----- Can you contact the pt about this sleep study?  I think he held off when we first ordered it due to the virus.  Anderson Malta

## 2018-10-14 ENCOUNTER — Telehealth: Payer: Self-pay | Admitting: *Deleted

## 2018-10-14 NOTE — Telephone Encounter (Signed)
Called patient on his cell lmtcb.

## 2018-10-14 NOTE — Telephone Encounter (Signed)
Covid-19 screening questions   Do you now or have you had a fever in the last 14 days? NO  Do you have any respiratory symptoms of shortness of breath or cough now or in the last 14 days? NO  Do you have any family members or close contacts with diagnosed or suspected Covid-19 in the past 14 days? NO  Have you been tested for Covid-19 and found to be positive? NO

## 2018-10-14 NOTE — Telephone Encounter (Signed)
Patient has been advised that Dr [name] has reviewed his labs ordered by Dr Maryruth Eve and notes the following: "needs Hepatitis B vaccine. Hemoglobin okay at 12.5. Continue current medications." Patient verbalizes understanding. He will be coming for office visit on 10/19/18 and will have hepatitis B injection 1 or 2 at that time.

## 2018-10-19 ENCOUNTER — Other Ambulatory Visit: Payer: Self-pay | Admitting: Internal Medicine

## 2018-10-19 ENCOUNTER — Ambulatory Visit (INDEPENDENT_AMBULATORY_CARE_PROVIDER_SITE_OTHER): Payer: BC Managed Care – PPO | Admitting: Internal Medicine

## 2018-10-19 ENCOUNTER — Encounter: Payer: Self-pay | Admitting: Internal Medicine

## 2018-10-19 VITALS — BP 120/78 | HR 88 | Temp 97.8°F | Ht 66.5 in | Wt 175.0 lb

## 2018-10-19 DIAGNOSIS — K529 Noninfective gastroenteritis and colitis, unspecified: Secondary | ICD-10-CM

## 2018-10-19 DIAGNOSIS — K50819 Crohn's disease of both small and large intestine with unspecified complications: Secondary | ICD-10-CM | POA: Diagnosis not present

## 2018-10-19 DIAGNOSIS — D509 Iron deficiency anemia, unspecified: Secondary | ICD-10-CM

## 2018-10-19 DIAGNOSIS — J8281 Chronic eosinophilic pneumonia: Secondary | ICD-10-CM

## 2018-10-19 DIAGNOSIS — Z79899 Other long term (current) drug therapy: Secondary | ICD-10-CM | POA: Diagnosis not present

## 2018-10-19 DIAGNOSIS — Z23 Encounter for immunization: Secondary | ICD-10-CM

## 2018-10-19 DIAGNOSIS — D84821 Immunodeficiency due to drugs: Secondary | ICD-10-CM

## 2018-10-19 DIAGNOSIS — J82 Pulmonary eosinophilia, not elsewhere classified: Secondary | ICD-10-CM

## 2018-10-19 DIAGNOSIS — Z7952 Long term (current) use of systemic steroids: Secondary | ICD-10-CM | POA: Diagnosis not present

## 2018-10-19 DIAGNOSIS — K219 Gastro-esophageal reflux disease without esophagitis: Secondary | ICD-10-CM

## 2018-10-19 MED ORDER — CHOLESTYRAMINE 4 G PO PACK: 4.0000 g | PACK | Freq: Three times a day (TID) | ORAL | 2 refills | Status: DC

## 2018-10-19 NOTE — Telephone Encounter (Signed)
Have you mailed a letter asking him to call?  We have to do that sometimes when we can't get in touch about results.  Thanks.

## 2018-10-19 NOTE — Progress Notes (Signed)
Subjective:    Patient ID: Jack [name], male    DOB: 22-Feb-1966, 53 y.o.   MRN: [id number]  HPI Jack [name] is a 53 year old male with a history of ileocolonic Crohn's disease, chronic eosinophilic pneumonitis/vasculitis on chronic prednisone, history of CAD with MI and drug-eluting stent placed in November 2019 on Plavix, history of schwannoma with compression of sciatic nerve status post lumbar surgery in May 2020 who is here for follow-up.  He was last seen by a virtual visit on 09/13/2018.  He is here in person today.  He just had his 1st Entyvio infusion at 4 weeks (10 days ago, next dose 11/02/2018).  He is using cholestyramine 4 g BID and lomotil 2-4 tabs per day.  He is still having stools that remain liquid and urgent.  He goes 2-3 times per day.  There is really no formed to his stool.  He has not seen blood in his stool or melena.  His decision to eat depends on his physical location.  If he is near a bathroom he will eat 2-3 meals a day.  He continues to skip meals if he knows that he will be near a bathroom.  His prednisone dose continues to be at 19 mg.  He has not seen his rheumatologist at [facility] very recently but did see an ENT physician at [facility] and was told he was "full of allergies" and was placed on a nasal spray but he has not started this yet.  He is not really having abdominal pain.  His appetite is present.  No fevers or chills.  No recent shortness of breath or cough.  He is hopeful that the higher dose of Entyvio will soon help his loose stools.  Of note a CT enterography performed on 08/30/2018 showed hyperemia within the distal ileum suspicious for mild active enteritis.  There was rectal mucosal hyperenhancement for which active colitis could not be excluded.  Review of Systems As per HPI, otherwise negative  Current Medications, Allergies, Past Medical History, Past Surgical History, Family History and Social History were reviewed in [employer]  record.     Objective:   Physical Exam BP 120/78    Pulse 88    Temp 97.8 F (36.6 C)    Ht 5' 6.5" (1.689 m)    Wt 175 lb (79.4 kg)    SpO2 98%    BMI 27.82 kg/m  Gen: awake, alert, NAD HEENT: anicteric, op clear CV: RRR, no mrg Pulm: CTA b/l Abd: soft, NT/ND, +BS throughout Ext: no c/c/e Neuro: nonfocal  CBC    Component Value Date/Time   WBC 9.3 08/02/2018 1048   RBC 4.85 08/02/2018 1048   HGB 11.7 (L) 08/02/2018 1048   HGB 13.2 04/20/2018 0941   HCT 39.5 08/02/2018 1048   HCT 39.6 04/20/2018 0941   PLT 359 08/02/2018 1048   PLT 338 04/20/2018 0941   MCV 81.4 08/02/2018 1048   MCV 86 04/20/2018 0941   MCH 24.1 (L) 08/02/2018 1048   MCHC 29.6 (L) 08/02/2018 1048   RDW 17.3 (H) 08/02/2018 1048   RDW 12.1 04/20/2018 0941   LYMPHSABS 1.1 07/15/2018 1347   MONOABS 0.9 07/15/2018 1347   EOSABS 0.1 07/15/2018 1347   BASOSABS 0.1 07/15/2018 1347   Hemoglobin was 12.5 on 10/04/2018.  Albumin was 3.3 on the same day  CMP     Component Value Date/Time   NA 138 08/02/2018 1048   NA 138 04/28/2018 0910   K  4.3 08/02/2018 1048   CL 104 08/02/2018 1048   CO2 26 08/02/2018 1048   GLUCOSE 129 (H) 08/02/2018 1048   BUN 16 08/02/2018 1048   BUN 26 (H) 04/28/2018 0910   CREATININE 1.39 (H) 08/02/2018 1048   CALCIUM 8.6 (L) 08/02/2018 1048   PROT 6.5 04/07/2018 1533   ALBUMIN 3.8 04/07/2018 1533   AST 25 04/07/2018 1533   ALT 32 04/07/2018 1533   ALKPHOS 39 04/07/2018 1533   BILITOT 1.0 04/07/2018 1533   GFRNONAA 58 (L) 08/02/2018 1048   GFRAA >60 08/02/2018 1048       Assessment & Plan:  53 year old male with a history of ileocolonic Crohn's disease, chronic eosinophilic pneumonitis/vasculitis on chronic prednisone, history of CAD with MI and drug-eluting stent placed in November 2019 on Plavix, history of schwannoma with compression of sciatic nerve status post lumbar surgery in May 2020 who is here for follow-up.  1.  Ileocolonic Crohn's disease with ongoing diarrhea  --he is on a significant prednisone dose at 19 mg a day and we have increased Entyvio dosing to every 4 weeks given a borderline trough level on 8-week dosing.  His fecal calprotectin has remained very elevated suggesting active inflammation.  Cross-sectional imaging last month showed probable ileitis though no active ileitis or colitis was seen at colonoscopy in January 2020 (performed at the time of bleeding).  If no improvement soon after higher Entyvio dose then we need to consider repeat endoscopy both upper and lower to evaluate for active Crohn's disease but also rule out other enteritis or colitis such as eosinophilic disease (particularly in light of his lung disease with known eosinophilia). --Entyvio every 4 weeks; check Entyvio level with antibody on 11/29/2018 which is trough --Continue prednisone 19 mg daily, taper per rheumatology and pulmonology at [facility] --Increase cholestyramine to 4 g 3 times daily (reminder to separate from other medicines by 2 hours) --Continue Lomotil 1 to 2 tablets 4 times a day as needed --Office follow-up in early September after trough level; endoscopy upper and lower if not improving to exclude another form of enteritis and colitis such as eosinophilic  2.  GERD --symptoms currently controlled continue pantoprazole 40 mg a day  3.  Eosinophilic pneumonitis and chronic nasal sinusitis --following with pulmonology, rheumatology and ENT at [facility]  4.  Anemia --stable hemoglobin of late, monitor going forward  5.  Need for hepatitis B vaccination --beginning hepatitis B vaccination series today  25 minutes spent with the patient today. Greater than 50% was spent in counseling and coordination of care with the patient

## 2018-10-19 NOTE — Patient Instructions (Addendum)
Please increase your cholestyramine to three time daily dosing.  Continue prednisone at current dosage.  Continue lomotil at current dosage.  Continue pantoprazole at current dosage.  You will need to come for Entyvio levels on 11/29/18. Please come to the 3rd floor and ask for Dottie. We will need to provide you with the contents to take to the lab.  Please follow up with Dr [name] around mid-September.   You have been given your Heplisav (Hepatisi B) vaccine #1 today.  You have been scheduled for your last Heplisav (Hepatitis B) vaccine on 11/23/18 at 8:45 am.

## 2018-10-19 NOTE — Telephone Encounter (Signed)
Called patient on his cell lmtcb. This is my third attempt to reach the patient and he has not returned any of my calls.

## 2018-10-25 ENCOUNTER — Encounter: Payer: Self-pay | Admitting: *Deleted

## 2018-10-25 NOTE — Telephone Encounter (Signed)
Yes, I have mailed him a letter today asking him to call our office.

## 2018-10-29 ENCOUNTER — Ambulatory Visit
Admission: RE | Admit: 2018-10-29 | Discharge: 2018-10-29 | Disposition: A | Discharge status: Home / Self Care | Payer: BC Managed Care – PPO | Source: Ambulatory Visit | Attending: Family Medicine | Admitting: Family Medicine

## 2018-10-29 DIAGNOSIS — N289 Disorder of kidney and ureter, unspecified: Secondary | ICD-10-CM

## 2018-10-30 ENCOUNTER — Encounter: Payer: Self-pay | Admitting: Family Medicine

## 2018-11-01 ENCOUNTER — Other Ambulatory Visit (INDEPENDENT_AMBULATORY_CARE_PROVIDER_SITE_OTHER): Payer: BC Managed Care – PPO

## 2018-11-01 ENCOUNTER — Other Ambulatory Visit: Payer: Self-pay | Admitting: Internal Medicine

## 2018-11-01 DIAGNOSIS — Z79899 Other long term (current) drug therapy: Secondary | ICD-10-CM

## 2018-11-01 DIAGNOSIS — K589 Irritable bowel syndrome without diarrhea: Secondary | ICD-10-CM | POA: Diagnosis not present

## 2018-11-01 DIAGNOSIS — Z7952 Long term (current) use of systemic steroids: Secondary | ICD-10-CM

## 2018-11-01 DIAGNOSIS — K50819 Crohn's disease of both small and large intestine with unspecified complications: Secondary | ICD-10-CM

## 2018-11-01 DIAGNOSIS — Z862 Personal history of diseases of the blood and blood-forming organs and certain disorders involving the immune mechanism: Secondary | ICD-10-CM | POA: Diagnosis not present

## 2018-11-01 DIAGNOSIS — J8281 Chronic eosinophilic pneumonia: Secondary | ICD-10-CM

## 2018-11-01 DIAGNOSIS — T380X5A Adverse effect of glucocorticoids and synthetic analogues, initial encounter: Secondary | ICD-10-CM

## 2018-11-01 DIAGNOSIS — K529 Noninfective gastroenteritis and colitis, unspecified: Secondary | ICD-10-CM

## 2018-11-01 DIAGNOSIS — D509 Iron deficiency anemia, unspecified: Secondary | ICD-10-CM

## 2018-11-01 DIAGNOSIS — J82 Pulmonary eosinophilia, not elsewhere classified: Secondary | ICD-10-CM

## 2018-11-01 LAB — CBC WITH DIFFERENTIAL/PLATELET
Basophils Absolute: 0.1 10*3/uL (ref 0.0–0.1)
Basophils Relative: 0.9 % (ref 0.0–3.0)
Eosinophils Absolute: 0.2 10*3/uL (ref 0.0–0.7)
Eosinophils Relative: 1.8 % (ref 0.0–5.0)
HCT: 40.3 % (ref 39.0–52.0)
Hemoglobin: 12.6 g/dL — ABNORMAL LOW (ref 13.0–17.0)
Lymphocytes Relative: 19.6 % (ref 12.0–46.0)
Lymphs Abs: 2.5 10*3/uL (ref 0.7–4.0)
MCHC: 31.2 g/dL (ref 30.0–36.0)
MCV: 83.6 fl (ref 78.0–100.0)
Monocytes Absolute: 1.3 10*3/uL — ABNORMAL HIGH (ref 0.1–1.0)
Monocytes Relative: 10.4 % (ref 3.0–12.0)
Neutro Abs: 8.7 10*3/uL — ABNORMAL HIGH (ref 1.4–7.7)
Neutrophils Relative %: 67.3 % (ref 43.0–77.0)
Platelets: 265 10*3/uL (ref 150.0–400.0)
RBC: 4.83 Mil/uL (ref 4.22–5.81)
RDW: 18.3 % — ABNORMAL HIGH (ref 11.5–15.5)
WBC: 13 10*3/uL — ABNORMAL HIGH (ref 4.0–10.5)

## 2018-11-01 LAB — BUN: BUN: 27 mg/dL — ABNORMAL HIGH (ref 6–23)

## 2018-11-01 LAB — CREATININE, SERUM: Creatinine, Ser: 1.41 mg/dL (ref 0.40–1.50)

## 2018-11-02 ENCOUNTER — Telehealth: Payer: Self-pay | Admitting: Family Medicine

## 2018-11-02 DIAGNOSIS — K5090 Crohn's disease, unspecified, without complications: Secondary | ICD-10-CM | POA: Diagnosis not present

## 2018-11-02 LAB — IBC + FERRITIN
Ferritin: 38.2 ng/mL (ref 22.0–322.0)
Iron: 56 ug/dL (ref 42–165)
Saturation Ratios: 15.4 % — ABNORMAL LOW (ref 20.0–50.0)
Transferrin: 259 mg/dL (ref 212.0–360.0)

## 2018-11-02 NOTE — Telephone Encounter (Signed)
Patient returned Jack [name]'s call about his ultrasound results.  I let patient know Dr. Synthia Innocent comments and that his results were released on my chart.  Patient voiced understanding.

## 2018-11-03 NOTE — Telephone Encounter (Signed)
Noted  

## 2018-11-20 NOTE — Progress Notes (Signed)
CARDIOLOGY OFFICE NOTE  Date:  11/22/2018    Jack [name] Date of Birth: 11/28/1965 Medical Record #[id number]  PCP:  [name], MD  Cardiologist:  Tamala Julian  Chief Complaint  Patient presents with  . Follow-up    Seen for Dr. Tamala Julian    History of Present Illness: Jack [name] is a 53 y.o. male who presents today for a 4 month check. Seen for Dr. Tamala Julian.   He has a history of eosinophilic granulomatosis, history of GI bleeding, essential hypertension, seizure disorder, Crohn's disease, and known CAD.   He presented with ST elevation myocardial infarction involving the LAD territory in September 2019. Alternate decrease in LV systolic function with EF approximately 35% and LV thrombusrequiring Coumadin therapy. Had DES to the LAD and has residual moderate CAD in circumflex and RCA territory.Developed post infarct pericarditis. Has subsequently had pause of antiplatelet therapy to treat lumbar disk.  Last seen by telehealth visit back in April by Dr. Tamala Julian. Lots of limitations noted due to hs back. Was planning on surgery in May. Was on DAPT. Cardiac status ok. Relatively sedentary. He was cleared for surgery. To resume Plavix post lumbar surgery.   The patient does not have symptoms concerning for COVID-19 infection (fever, chills, cough, or new shortness of breath).   Comes in today. Here with his wife - she helps augment the history. He is doing ok. No chest pain. He had his back surgery - did well. Only on Plavix. Some bruising noted. He is back on the treadmill - can do 4 miles a couple of times a week with no real issue. More limited by his Crohn's - seeing Dr. [name] tomorrow. Needs labs - has not had follow up PSA - thinks he might have missed his labs with PCP - last PSA was almost 8. He does not weigh. They have had some take out but tries to mainly eat at home. No real cardiac concerns noted. BP typically low and limits his medical therapy. Weight  fluctuates. No swelling noted.   Past Medical History:  Diagnosis Date  . Acquired renal cyst of right kidney 08/31/2018   2.8cm R upper pole rec monitor with yearly imaging (09/2018)  . BPH (benign prostatic hypertrophy)   . CAD (coronary artery disease)    a. anterior STEMI 01/2018 -  proximal occlusion of LAD, treated with DES. Cath also showed 20% distal LM, 95% ostial-prox small-moderate ramus, 70-80% ostial Cx, 70% dominant ostial OM, 50-60% prox Cx, 70% RCA. EF 35% by cath with LVEDP 34mHg. Med rx for residual disease. Course complicated by post MI pericarditis and LV thrombus.  . Chronic systolic (congestive) heart failure ([place]   . Colitis   . Colon polyp    inflammatory  . Crohn disease ([place] 1992   history uveitis, involvement of intestines and lungs  . Diverticulosis   . Eosinophilic granuloma ([place]   . Essential hypertension   . GERD (gastroesophageal reflux disease)   . GI bleeding   . History of chicken pox   . History of gastroesophageal reflux (GERD)   . History of seizure 1995   grand mal x1, completed 6 yrs dilantin. no seizures since  . Hyperlipidemia   . Internal hemorrhoids   . Ischemic cardiomyopathy   . LV (left ventricular) mural thrombus following MI ([facility] 01/2018  . Myocardial infarction ([place]   . Osteoporosis 11/2015   DEXA T -2.9  . Schwannoma 2007   L axilla s/p surgery  .  Seizures ([place])    last seizure 1995 and only x 1 seizure-   . Ulnar neuropathy    h/o this from L arm schwannoma    Past Surgical History:  Procedure Laterality Date  . APPENDECTOMY  2000  . BACK SURGERY  2011   lumbar  . CARDIAC CATHETERIZATION    . CARDIOVASCULAR STRESS TEST  01/2015   low risk study  . CHOLECYSTECTOMY  2000  . COLONOSCOPY  08/2014   f/u crohn's, 4 inflammatory polyps, diverticulosis, improved, rpt 2 yrs ([place])  . COLONOSCOPY WITH PROPOFOL N/A 04/09/2018   inflammatory polyp, f/u left to primary GI - [facility] (Danis, Kirke Corin, MD)  . CORONARY  ANGIOGRAPHY N/A 02/21/2018   Procedure: CORONARY ANGIOGRAPHY;  Surgeon: Belva Crome, MD;  Location: [place] [facility];  Service: Cardiovascular;  Laterality: N/A;  . CORONARY/GRAFT ACUTE MI REVASCULARIZATION N/A 02/20/2018   Procedure: Coronary/Graft Acute MI Revascularization;  Surgeon: Belva Crome, MD;  Location: [place] [facility];  Service: Cardiovascular;  Laterality: N/A;  . ESOPHAGOGASTRODUODENOSCOPY  07/2014   h/o EE resolved, focal reflux esophagitis, chronic active gastritis  . extremity surgery Left    ulnar nerve repair after schwannoma removal  . FLEXIBLE SIGMOIDOSCOPY N/A 04/14/2018   Procedure: FLEXIBLE SIGMOIDOSCOPY;  Surgeon: Milus Banister, MD;  Location: [facility] ENDOSCOPY;  Service: Endoscopy;  Laterality: N/A;  . INGUINAL HERNIA REPAIR Bilateral 2017  . LAMINECTOMY N/A 08/09/2018   Procedure: Lumbar three Laminectomy, excision of intradural tumor;  Surgeon: Kristeen Miss, MD;  Location: [place] [facility];  Service: Neurosurgery;  Laterality: N/A;  . LEFT HEART CATH AND CORONARY ANGIOGRAPHY N/A 02/20/2018   Procedure: LEFT HEART CATH AND CORONARY ANGIOGRAPHY;  Surgeon: Belva Crome, MD;  Location: [place] [facility];  Service: Cardiovascular;  Laterality: N/A;  . LUMBAR SPINE SURGERY  2011  . POLYPECTOMY  04/09/2018   Procedure: POLYPECTOMY;  Surgeon: Doran Stabler, MD;  Location: [facility] ENDOSCOPY;  Service: Gastroenterology;;  . SPINAL CORD STIMULATOR IMPLANT  2012   x2 (Dr Berton Mount)  . SUBMUCOSAL INJECTION  04/09/2018   Procedure: SUBMUCOSAL INJECTION;  Surgeon: Doran Stabler, MD;  Location: [facility] ENDOSCOPY;  Service: Gastroenterology;;  . TONSILLECTOMY  1996  . TUMOR REMOVAL Left 2007   schwannoma from L armpit  . UPPER GASTROINTESTINAL ENDOSCOPY       Medications: Current Meds  Medication Sig  . acetaminophen (TYLENOL) 325 MG tablet Take 650 mg by mouth every 6 (six) hours as needed for mild pain or headache.   . alendronate (FOSAMAX) 70 MG tablet Take 70 mg by mouth  every Saturday.   [name] atorvastatin (LIPITOR) 80 MG tablet Take 1 tablet (80 mg total) by mouth daily at 6 PM.  . Calcium Carbonate-Vit D-Min (CALCIUM 1200 PO) Take 1 capsule by mouth 2 (two) times daily.  . carvedilol (COREG) 6.25 MG tablet Take 1 tablet (6.25 mg total) by mouth 2 (two) times daily.  . cholestyramine (QUESTRAN) 4 g packet Take 1 packet (4 g total) by mouth 3 (three) times daily. (pharmacy please d/c rx for bid dosing)  . clopidogrel (PLAVIX) 75 MG tablet Take 1 tablet (75 mg total) by mouth daily.  . diphenoxylate-atropine (LOMOTIL) 2.5-0.025 MG tablet TAKE 1-2 TABLETS BY MOUTH 4 TIMES A DAY AS NEEDED FOR DIARRHEA  . ferrous sulfate 325 (65 FE) MG tablet Take 1 tablet (325 mg total) by mouth daily with breakfast.  . furosemide (LASIX) 20 MG tablet Take only as needed  .  hydrOXYzine (ATARAX/VISTARIL) 25 MG tablet TAKE 4 TABLETS (100 MG TOTAL) BY MOUTH AT BEDTIME.  Vanessa Kick Ethyl (VASCEPA) 1 g CAPS Take 2 capsules (2 g total) by mouth 2 (two) times daily.  [name] losartan (COZAAR) 25 MG tablet Take 1 tablet (25 mg total) by mouth daily.  . Multiple Vitamin (MULTIVITAMIN) capsule Take 1 capsule by mouth daily.  . nitroGLYCERIN (NITROSTAT) 0.4 MG SL tablet Place 1 tablet (0.4 mg total) under the tongue every 5 (five) minutes as needed for chest pain.  . pantoprazole (PROTONIX) 40 MG tablet Take 1 tablet (40 mg total) by mouth daily.  . predniSONE (DELTASONE) 20 MG tablet 40 mg taper to baseline of 20 mg/day over 6 days  . spironolactone (ALDACTONE) 25 MG tablet Take 0.5 tablets (12.5 mg total) by mouth daily.  . tadalafil (CIALIS) 5 MG tablet TAKE ONE TABLET BY MOUTH DAILY AS NEEDED FOR ERECTILE DYSFUNCTION  . tamsulosin (FLOMAX) 0.4 MG CAPS capsule TAKE 1 CAPSULE (0.4 MG TOTAL) BY MOUTH DAILY.  . vedolizumab (ENTYVIO) 300 MG injection Inject 300 mg into the vein every 7 (seven) weeks.      Allergies: Allergies  Allergen Reactions  . Cephalexin Nausea And Vomiting    Other  Reaction: GI UPSET  . Duloxetine Other (See Comments)    HEADACHE      Social History: The patient  reports that he quit smoking about 9 months ago. His smoking use included cigars. He has never used smokeless tobacco. He reports current alcohol use. He reports that he does not use drugs.   Family History: The patient's family history includes CAD (age of onset: 4) in his father and mother; Colon cancer in his maternal grandmother; Congenital heart disease in his mother; Crohn's disease in his brother; Hyperlipidemia in his father and mother; Hypertension in his father and mother.   Review of Systems: Please see the history of present illness.   All other systems are reviewed and negative.   Physical Exam: VS:  BP 100/74   Pulse 75   Ht 5' 7"  (1.702 m)   Wt 173 lb (78.5 kg)   SpO2 98%   BMI 27.10 kg/m  .  BMI Body mass index is 27.1 kg/m.  Wt Readings from Last 3 Encounters:  11/22/18 173 lb (78.5 kg)  10/19/18 175 lb (79.4 kg)  09/13/18 164 lb (74.4 kg)    General: Alert and in no acute distress. Affect flat.   HEENT: Normal.  Neck: Supple, no JVD, carotid bruits, or masses noted.  Cardiac: Regular rate and rhythm. No murmurs, rubs, or gallops. No edema.  Respiratory:  Lungs are clear to auscultation bilaterally with normal work of breathing.  GI: Soft and nontender.  MS: No deformity or atrophy. Gait and ROM intact.  Skin: Warm and dry. Color is normal.  Neuro:  Strength and sensation are intact and no gross focal deficits noted.  Psych: Alert, appropriate and with normal affect.   LABORATORY DATA:  EKG:  EKG is not ordered today.   Lab Results  Component Value Date   WBC 13.0 (H) 11/01/2018   HGB 12.6 (L) 11/01/2018   HCT 40.3 11/01/2018   PLT 265.0 11/01/2018   GLUCOSE 129 (H) 08/02/2018   CHOL 144 04/28/2018   TRIG 154 (H) 04/28/2018   HDL 38 (L) 04/28/2018   LDLCALC 75 04/28/2018   ALT 32 04/07/2018   AST 25 04/07/2018   NA 138 08/02/2018   K 4.3  08/02/2018   CL 104  08/02/2018   CREATININE 1.41 11/01/2018   BUN 27 (H) 11/01/2018   CO2 26 08/02/2018   TSH 0.86 07/15/2018   PSA 7.93 (H) 03/20/2017   INR 1.3 (H) 08/09/2018   HGBA1C 5.2 02/20/2018     BNP (last 3 results) Recent Labs    02/20/18 2331  BNP 14.9    ProBNP (last 3 results) Recent Labs    03/18/18 1524 03/22/18 1140  PROBNP 2,934* 2,573*     Other Studies Reviewed Today:  ECHO IMPRESSIONS 05/2018    1. The left ventricle has moderately reduced systolic function, with an ejection fraction of 35-40%. The cavity size was normal. There is mildly increased left ventricular wall thickness. Left ventricular diastolic Doppler parameters are consistent with  impaired relaxation Indeterminent filling pressures The E/e' is 8-15.  2. Severe akinesis of the left ventricular anterior wall, anteroseptal wall and apical segment.  3. The right ventricle has normal systolic function. The cavity was normal. There is no increase in right ventricular wall thickness.  4. The mitral valve is degenerative. Mild thickening of the mitral valve leaflet. Mild calcification of the mitral valve leaflet. Mitral valve regurgitation is mild to moderate by color flow Doppler.  5. The aortic valve is tricuspid Mild thickening of the aortic valve Mild calcification of the aortic valve. Aortic valve regurgitation is mild by color flow Doppler.  6. The aortic root and ascending aorta are normal in size and structure.  7. When compared to the prior study: No change compared to prior study in 02/2018.  Notes recorded by Belva Crome, MD on 05/29/2018 at 6:06 PM EST  Let the patient know the echo is stable. EF 35-40%. No clot noted. Continue coumadin.  A copy will be sent to [name], MD   CORONARY ANGIOGRAPHY 01/2018  Conclusion   Anatomy is stable when compared to immediate post PCI results.  There is TIMI grade III flow in the LAD and circumflex.  The proximal LAD stent is  widely patent and as before there is proximal plaque obstructing the vessel up to 40%.  RECOMMENDATIONS:   We will give 40 mg of prednisone orally today which is twice his typical dose.  Start colchicine 0.6 mg twice daily.  2D Doppler echocardiogram is already been ordered.  We will give a stronger analgesic and also provide benzodiazepine therapy as the patient is very concerned that he cannot sleep.  If the patient develops progression in the disease proximal to the stented segment, coronary artery bypass grafting would be his best treatment option.   Recommend uninterrupted dual antiplatelet therapy with Aspirin 31m daily and Ticagrelor 942mtwice daily for a minimum of 12 months (ACS - Class I recommendation).     Assessment/Plan:  1. CAD - felt to be high risk for future events - he has no active symptoms. Continue with current plan of care. Remains on Plavix. Lab today.   2. Ischemic CM - BP limits optimal guideline therapy. He has been able to be more active.   3. HTN - BP typically soft - see above.   4. HLD - started on icosapent ethyl at prior visit for elevated trigs - rechecking his lab today.   5. Back pain - prior lumbar surgery - this seems to have gone ok - he is now more active.   6. Prior LV mural thrombus. Not noted on prior echo.   7. History of elevated PSA - no recent check - needs repeat - does not  see urology.   8. Crohn's - seeing Dr. [name] tomorrow - does not sound like this has improved - this seems to be his most pressing issue at this time.   9. COVID-19 Education: The signs and symptoms of COVID-19 were discussed with the patient and how to seek care for testing (follow up with PCP or arrange E-visit).  The importance of social distancing, staying at home, hand hygiene and wearing a mask when out in public were discussed today.  Current medicines are reviewed with the patient today.  The patient does not have concerns regarding medicines  other than what has been noted above.  The following changes have been made:  See above.  Labs/ tests ordered today include:    Orders Placed This Encounter  Procedures  . Basic metabolic panel  . CBC  . Hepatic function panel  . Lipid panel  . PSA     Disposition:   FU with Dr. Tamala Julian in 4 months. I am happy to see back as needed.   Patient is agreeable to this plan and will call if any problems develop in the interim.   SignedTruitt Merle, NP  11/22/2018 9:48 AM  [facility] [address] [place], [place]  [address] Phone: [phone] Fax: [phone]

## 2018-11-22 ENCOUNTER — Ambulatory Visit: Payer: BC Managed Care – PPO | Admitting: Nurse Practitioner

## 2018-11-22 ENCOUNTER — Encounter: Payer: Self-pay | Admitting: Nurse Practitioner

## 2018-11-22 ENCOUNTER — Other Ambulatory Visit: Payer: Self-pay

## 2018-11-22 VITALS — BP 100/74 | HR 75 | Ht 67.0 in | Wt 173.0 lb

## 2018-11-22 DIAGNOSIS — I10 Essential (primary) hypertension: Secondary | ICD-10-CM

## 2018-11-22 DIAGNOSIS — Z79899 Other long term (current) drug therapy: Secondary | ICD-10-CM

## 2018-11-22 DIAGNOSIS — I2510 Atherosclerotic heart disease of native coronary artery without angina pectoris: Secondary | ICD-10-CM | POA: Diagnosis not present

## 2018-11-22 DIAGNOSIS — I255 Ischemic cardiomyopathy: Secondary | ICD-10-CM | POA: Diagnosis not present

## 2018-11-22 DIAGNOSIS — E785 Hyperlipidemia, unspecified: Secondary | ICD-10-CM | POA: Diagnosis not present

## 2018-11-22 DIAGNOSIS — R972 Elevated prostate specific antigen [PSA]: Secondary | ICD-10-CM

## 2018-11-22 DIAGNOSIS — Z7189 Other specified counseling: Secondary | ICD-10-CM

## 2018-11-22 DIAGNOSIS — Z7901 Long term (current) use of anticoagulants: Secondary | ICD-10-CM

## 2018-11-22 LAB — CBC
Hematocrit: 44.7 % (ref 37.5–51.0)
Hemoglobin: 14.1 g/dL (ref 13.0–17.7)
MCH: 26.6 pg (ref 26.6–33.0)
MCHC: 31.5 g/dL (ref 31.5–35.7)
MCV: 84 fL (ref 79–97)
Platelets: 302 10*3/uL (ref 150–450)
RBC: 5.31 x10E6/uL (ref 4.14–5.80)
RDW: 15.1 % (ref 11.6–15.4)
WBC: 16 10*3/uL — ABNORMAL HIGH (ref 3.4–10.8)

## 2018-11-22 LAB — HEPATIC FUNCTION PANEL
ALT: 25 IU/L (ref 0–44)
AST: 24 IU/L (ref 0–40)
Albumin: 4 g/dL (ref 3.8–4.9)
Alkaline Phosphatase: 65 IU/L (ref 39–117)
Bilirubin Total: 0.7 mg/dL (ref 0.0–1.2)
Bilirubin, Direct: 0.2 mg/dL (ref 0.00–0.40)
Total Protein: 6.8 g/dL (ref 6.0–8.5)

## 2018-11-22 LAB — BASIC METABOLIC PANEL
BUN/Creatinine Ratio: 11 (ref 9–20)
BUN: 16 mg/dL (ref 6–24)
CO2: 22 mmol/L (ref 20–29)
Calcium: 9.2 mg/dL (ref 8.7–10.2)
Chloride: 102 mmol/L (ref 96–106)
Creatinine, Ser: 1.45 mg/dL — ABNORMAL HIGH (ref 0.76–1.27)
GFR calc Af Amer: 64 mL/min/{1.73_m2} (ref >59)
GFR calc non Af Amer: 55 mL/min/{1.73_m2} — ABNORMAL LOW (ref >59)
Glucose: 87 mg/dL (ref 65–99)
Potassium: 4.5 mmol/L (ref 3.5–5.2)
Sodium: 142 mmol/L (ref 134–144)

## 2018-11-22 LAB — LIPID PANEL
Chol/HDL Ratio: 3.9 ratio (ref 0.0–5.0)
Cholesterol, Total: 124 mg/dL (ref 100–199)
HDL: 32 mg/dL — ABNORMAL LOW (ref >39)
LDL Calculated: 63 mg/dL (ref 0–99)
Triglycerides: 144 mg/dL (ref 0–149)
VLDL Cholesterol Cal: 29 mg/dL (ref 5–40)

## 2018-11-22 LAB — PSA: Prostate Specific Ag, Serum: 6.7 ng/mL — ABNORMAL HIGH (ref 0.0–4.0)

## 2018-11-22 NOTE — Patient Instructions (Addendum)
After Visit Summary:  We will be checking the following labs today - BMET, CBC, HPF, Lipids and PSA   Medication Instructions:    Continue with your current medicines.    If you need a refill on your cardiac medications before your next appointment, please call your pharmacy.     Testing/Procedures To Be Arranged:  N/A  Follow-Up:   See Dr. Tamala Julian in 4 months    At [facility], you and your health needs are our priority.  As part of our continuing mission to provide you with exceptional heart care, we have created designated Provider Care Teams.  These Care Teams include your primary Cardiologist (physician) and Advanced Practice Providers (APPs -  Physician Assistants and Nurse Practitioners) who all work together to provide you with the care you need, when you need it.  Special Instructions:  . Stay safe, stay home, wash your hands for at least 20 seconds and wear a mask when out in public.  . It was good to talk with you today.  . Try to weigh every day . Try to limit the take out food   Call the [place] office at [phone] if you have any questions, problems or concerns.

## 2018-11-23 ENCOUNTER — Ambulatory Visit (INDEPENDENT_AMBULATORY_CARE_PROVIDER_SITE_OTHER): Payer: BC Managed Care – PPO | Admitting: Internal Medicine

## 2018-11-23 ENCOUNTER — Encounter: Payer: Self-pay | Admitting: Family Medicine

## 2018-11-23 DIAGNOSIS — K50811 Crohn's disease of both small and large intestine with rectal bleeding: Secondary | ICD-10-CM

## 2018-11-23 DIAGNOSIS — R972 Elevated prostate specific antigen [PSA]: Secondary | ICD-10-CM

## 2018-11-25 NOTE — Telephone Encounter (Signed)
Given ongoing symptoms, let's set him up for endoscopy and colonoscopy  Will need to see from cardiology 1st if plavix can be held.   Thanks

## 2018-11-25 NOTE — Telephone Encounter (Signed)
plz send latest virtual physical from 08/2018 as well as recent PSA trend.

## 2018-11-26 ENCOUNTER — Telehealth: Payer: Self-pay

## 2018-11-26 ENCOUNTER — Telehealth: Payer: Self-pay | Admitting: Internal Medicine

## 2018-11-26 ENCOUNTER — Encounter: Payer: Self-pay | Admitting: Internal Medicine

## 2018-11-26 NOTE — Telephone Encounter (Signed)
Christy please schedule pt for endo/colon with Dr. [name]. It needs to be several weeks out so I can get clearance from cardiologist to hold plavix. Please let me know date when scheduled.

## 2018-11-26 NOTE — Telephone Encounter (Signed)
[facility] Pre-operative Risk Assessment     Request for surgical clearance:     Endoscopy Procedure  What type of surgery is being performed?     Endoscopy/colonoscopy  When is this surgery scheduled?     12/16/18  What type of clearance is required ?   Pharmacy  Are there any medications that need to be held prior to surgery and how long? Plavix  Practice name and name of physician performing surgery?      [facility] Dr. [name]  What is your office phone and fax number?      Phone- [phone]  [phone]  Anesthesia type (None, local, MAC, general) ?       MAC

## 2018-11-26 NOTE — Telephone Encounter (Signed)
Jack [name], scheduled endo/colon for 12-16-2018 and previsit for 12-02-2018.  Will send paperwork.   Pt's wife also asked if pt needs to stop his Iron supplement. Last time he had it checked it was 14.1

## 2018-11-26 NOTE — Telephone Encounter (Signed)
Left message for pt that Dr. [name] had said his ferritin was better but he was not to stop the Iron. Letter sent to cardiology to get instructions regarding holding Plavix for procedures.

## 2018-11-26 NOTE — Telephone Encounter (Signed)
Dr. Tamala Julian, ok with you to hold plavix for 5 days prior to endoscopy or colonoscopy? Patient had DES to prox on 02/20/2018 for anterior STEMI. He was seen as virtual visit in Apr 2020 and was cleared for back surgery in May 2020. He was seen by Truitt Merle 4 days ago and was doing ok, but felt to be high risk for future events.

## 2018-11-27 ENCOUNTER — Other Ambulatory Visit: Payer: Self-pay | Admitting: Internal Medicine

## 2018-11-29 NOTE — Telephone Encounter (Signed)
Dr [name]- We are getting a request from pharmacy for prednisone for this patient. This particular script was written back in April for 40 mg decreasing by 5 mg weekly until at 20 mg baseline. I realize he stays on this dosage but are we the ones who does his prednisone script typically or did you want [facility] pulmonary or rheumatology to do this? Im having a hard time telling by last office note. Thanks.

## 2018-11-29 NOTE — Telephone Encounter (Signed)
Okay to hold Plavix prior to endo as requested.

## 2018-11-29 NOTE — Telephone Encounter (Signed)
Would prefer pulm to write the pred, that said he will have ECL soon as his IBD is likely active Can provide refills for 20 mg daily x 1 month He does not need to stop abruptly as he would get sick quickly

## 2018-11-30 ENCOUNTER — Other Ambulatory Visit (INDEPENDENT_AMBULATORY_CARE_PROVIDER_SITE_OTHER): Payer: BC Managed Care – PPO

## 2018-11-30 DIAGNOSIS — K50819 Crohn's disease of both small and large intestine with unspecified complications: Secondary | ICD-10-CM

## 2018-11-30 HISTORY — PX: COLONOSCOPY: SHX174

## 2018-11-30 HISTORY — PX: ESOPHAGOGASTRODUODENOSCOPY: SHX1529

## 2018-11-30 LAB — CBC WITH DIFFERENTIAL/PLATELET
Basophils Absolute: 0.2 10*3/uL — ABNORMAL HIGH (ref 0.0–0.1)
Basophils Relative: 1.1 % (ref 0.0–3.0)
Eosinophils Absolute: 0.7 10*3/uL (ref 0.0–0.7)
Eosinophils Relative: 4.5 % (ref 0.0–5.0)
HCT: 40.6 % (ref 39.0–52.0)
Hemoglobin: 13.1 g/dL (ref 13.0–17.0)
Lymphocytes Relative: 20.2 % (ref 12.0–46.0)
Lymphs Abs: 2.9 10*3/uL (ref 0.7–4.0)
MCHC: 32.2 g/dL (ref 30.0–36.0)
MCV: 84 fl (ref 78.0–100.0)
Monocytes Absolute: 2.2 10*3/uL — ABNORMAL HIGH (ref 0.1–1.0)
Monocytes Relative: 15 % — ABNORMAL HIGH (ref 3.0–12.0)
Neutro Abs: 8.6 10*3/uL — ABNORMAL HIGH (ref 1.4–7.7)
Neutrophils Relative %: 59.2 % (ref 43.0–77.0)
Platelets: 275 10*3/uL (ref 150.0–400.0)
RBC: 4.84 Mil/uL (ref 4.22–5.81)
RDW: 16.6 % — ABNORMAL HIGH (ref 11.5–15.5)
WBC: 14.5 10*3/uL — ABNORMAL HIGH (ref 4.0–10.5)

## 2018-11-30 LAB — IBC + FERRITIN
Ferritin: 40.3 ng/mL (ref 22.0–322.0)
Iron: 72 ug/dL (ref 42–165)
Saturation Ratios: 21.7 % (ref 20.0–50.0)
Transferrin: 237 mg/dL (ref 212.0–360.0)

## 2018-11-30 NOTE — Telephone Encounter (Signed)
Pt ok to hold plavix for 5 days, see additional phone note dated 12/27/18.

## 2018-11-30 NOTE — Telephone Encounter (Signed)
I have left a message for patient to advise that we will give 1 month supply of prednisone but that Dr [name] would prefer in the future that pulmonary write prescription for prednisone.

## 2018-11-30 NOTE — Telephone Encounter (Signed)
Dr. [name] please see notes below, it looks as pt is cleared and ok to hold plavix for 5 days. Previsit is scheduled for 12/02/18.

## 2018-11-30 NOTE — Telephone Encounter (Signed)
   Primary Cardiologist: Sinclair Grooms, MD  Chart reviewed as part of pre-operative protocol coverage. Patient has history of CAD with STEMI in 11/2017 with resulting decrease in EF initially treated with DAPT. Most recent Echo from 05/2018 showed LVEF of 35-40% but patient has been stable and more active. Patient was cleared for back surgery in 07/2018 with plans to resume Plavix post surgery.  Patient was recently seen by Jack [name] on 11/22/2018 and was not having any active angina or CHF symptoms. Although felt to be high risk for future CAD events.  Given past medical history and time since last visit, based on ACC/AHA guidelines, Jack [name] would be at acceptable risk for the planned procedure without further cardiovascular testing.    Per Dr. Tamala Julian, Day to hold Plavix for 5 days prior to procedure. Resume after as soon as OK with Psychologist, sport and exercise.  I will route this recommendation to the requesting party via Epic fax function and remove from pre-op pool.  Please call with questions.  Darreld Mclean, PA-C 11/30/2018, 9:24 AM

## 2018-12-02 ENCOUNTER — Other Ambulatory Visit: Payer: Self-pay

## 2018-12-02 ENCOUNTER — Ambulatory Visit (AMBULATORY_SURGERY_CENTER): Payer: Self-pay | Admitting: *Deleted

## 2018-12-02 VITALS — Temp 97.8°F | Ht 67.0 in | Wt 172.0 lb

## 2018-12-02 DIAGNOSIS — K5090 Crohn's disease, unspecified, without complications: Secondary | ICD-10-CM | POA: Diagnosis not present

## 2018-12-02 DIAGNOSIS — K219 Gastro-esophageal reflux disease without esophagitis: Secondary | ICD-10-CM

## 2018-12-02 DIAGNOSIS — K50811 Crohn's disease of both small and large intestine with rectal bleeding: Secondary | ICD-10-CM

## 2018-12-02 NOTE — Progress Notes (Signed)
No egg or soy allergy known to patient  No issues with past sedation with any surgeries  or procedures, no intubation problems  No diet pills per patient No home 02 use per patient  No blood thinners per patient  Pt denies issues with constipation  No A fib or A flutter  EMMI  Information given Patient has Suprep at home Requested earlier appointment related to wanted done prior to vacation. Unable to accommodate. Requested earlier time slot, unable to change for the patient.

## 2018-12-03 ENCOUNTER — Encounter: Payer: Self-pay | Admitting: Internal Medicine

## 2018-12-05 ENCOUNTER — Other Ambulatory Visit: Payer: Self-pay | Admitting: Student

## 2018-12-07 DIAGNOSIS — D225 Melanocytic nevi of trunk: Secondary | ICD-10-CM | POA: Diagnosis not present

## 2018-12-07 DIAGNOSIS — D485 Neoplasm of uncertain behavior of skin: Secondary | ICD-10-CM | POA: Diagnosis not present

## 2018-12-07 DIAGNOSIS — L821 Other seborrheic keratosis: Secondary | ICD-10-CM | POA: Diagnosis not present

## 2018-12-07 DIAGNOSIS — D229 Melanocytic nevi, unspecified: Secondary | ICD-10-CM | POA: Diagnosis not present

## 2018-12-07 DIAGNOSIS — L814 Other melanin hyperpigmentation: Secondary | ICD-10-CM | POA: Diagnosis not present

## 2018-12-15 ENCOUNTER — Telehealth: Payer: Self-pay

## 2018-12-15 NOTE — Telephone Encounter (Signed)
Covid-19 screening questions   Do you now or have you had a fever in the last 14 days?  Do you have any respiratory symptoms of shortness of breath or cough now or in the last 14 days?  Do you have any family members or close contacts with diagnosed or suspected Covid-19 in the past 14 days?  Have you been tested for Covid-19 and found to be positive?       

## 2018-12-16 ENCOUNTER — Other Ambulatory Visit: Payer: Self-pay | Admitting: Internal Medicine

## 2018-12-16 ENCOUNTER — Other Ambulatory Visit: Payer: Self-pay

## 2018-12-16 ENCOUNTER — Ambulatory Visit (AMBULATORY_SURGERY_CENTER): Payer: BC Managed Care – PPO | Admitting: Internal Medicine

## 2018-12-16 ENCOUNTER — Encounter: Payer: Self-pay | Admitting: Internal Medicine

## 2018-12-16 VITALS — BP 127/69 | HR 50 | Temp 98.0°F | Resp 21 | Ht 67.0 in | Wt 172.0 lb

## 2018-12-16 DIAGNOSIS — D127 Benign neoplasm of rectosigmoid junction: Secondary | ICD-10-CM

## 2018-12-16 DIAGNOSIS — D123 Benign neoplasm of transverse colon: Secondary | ICD-10-CM | POA: Diagnosis not present

## 2018-12-16 DIAGNOSIS — K529 Noninfective gastroenteritis and colitis, unspecified: Secondary | ICD-10-CM | POA: Diagnosis not present

## 2018-12-16 DIAGNOSIS — K5289 Other specified noninfective gastroenteritis and colitis: Secondary | ICD-10-CM

## 2018-12-16 DIAGNOSIS — K51411 Inflammatory polyps of colon with rectal bleeding: Secondary | ICD-10-CM | POA: Diagnosis not present

## 2018-12-16 DIAGNOSIS — D122 Benign neoplasm of ascending colon: Secondary | ICD-10-CM | POA: Diagnosis not present

## 2018-12-16 DIAGNOSIS — D120 Benign neoplasm of cecum: Secondary | ICD-10-CM

## 2018-12-16 DIAGNOSIS — K219 Gastro-esophageal reflux disease without esophagitis: Secondary | ICD-10-CM

## 2018-12-16 DIAGNOSIS — K51511 Left sided colitis with rectal bleeding: Secondary | ICD-10-CM | POA: Diagnosis not present

## 2018-12-16 DIAGNOSIS — K5090 Crohn's disease, unspecified, without complications: Secondary | ICD-10-CM | POA: Diagnosis not present

## 2018-12-16 DIAGNOSIS — K50811 Crohn's disease of both small and large intestine with rectal bleeding: Secondary | ICD-10-CM | POA: Diagnosis not present

## 2018-12-16 DIAGNOSIS — R197 Diarrhea, unspecified: Secondary | ICD-10-CM

## 2018-12-16 DIAGNOSIS — D124 Benign neoplasm of descending colon: Secondary | ICD-10-CM | POA: Diagnosis not present

## 2018-12-16 MED ORDER — SODIUM CHLORIDE 0.9 % IV SOLN: 500.0000 mL | Freq: Once | INTRAVENOUS | Status: DC

## 2018-12-16 NOTE — Progress Notes (Signed)
Called to room to assist during endoscopic procedure.  Patient ID and intended procedure confirmed with present staff. Received instructions for my participation in the procedure from the performing physician.  

## 2018-12-16 NOTE — Op Note (Signed)
[place] Patient Name: Jack [name] Procedure Date: 12/16/2018 2:20 PM MRN: [id number] Endoscopist: Jerene Bears , MD Age: 53 Referring MD:  Date of Birth: December 03, 1965 Gender: Male Account #: [id number] Procedure:                Colonoscopy Indications:              Chronic diarrhea, Crohn's disease of the small                            bowel and colon, Disease activity assessment of                            Crohn's disease of the small bowel and colon Medicines:                Monitored Anesthesia Care Procedure:                Pre-Anesthesia Assessment:                           - Prior to the procedure, a History and Physical                            was performed, and patient medications and                            allergies were reviewed. The patient's tolerance of                            previous anesthesia was also reviewed. The risks                            and benefits of the procedure and the sedation                            options and risks were discussed with the patient.                            All questions were answered, and informed consent                            was obtained. Prior Anticoagulants: The patient has                            taken Plavix (clopidogrel), last dose was 5 days                            prior to procedure. ASA Grade Assessment: III - A                            patient with severe systemic disease. After                            reviewing the risks and benefits, the patient was  deemed in satisfactory condition to undergo the                            procedure.                           After obtaining informed consent, the colonoscope                            was passed under direct vision. Throughout the                            procedure, the patient's blood pressure, pulse, and                            oxygen saturations were monitored continuously. The                        Colonoscope was introduced through the anus and                            advanced to the terminal ileum. The colonoscopy was                            performed without difficulty. The patient tolerated                            the procedure well. The quality of the bowel                            preparation was good. The terminal ileum, ileocecal                            valve, appendiceal orifice, and rectum were                            photographed. Scope In: 2:37:50 PM Scope Out: 3:12:08 PM Scope Withdrawal Time: 0 hours 26 minutes 50 seconds  Total Procedure Duration: 0 hours 34 minutes 18 seconds  Findings:                 The digital rectal exam was normal.                           The terminal ileum appeared normal.                           A 4 mm polyp was found in the cecum. The polyp was                            sessile. The polyp was removed with a cold snare.                            Resection and retrieval were complete.  A 5 mm polyp was found in the ascending colon. The                            polyp was sessile. The polyp was removed with a                            cold snare. Resection and retrieval were complete.                           Two sessile polyps were found in the transverse                            colon. The polyps were 4 to 5 mm in size. These                            polyps were removed with a cold snare. Resection                            and retrieval were complete.                           A 7 mm polyp was found in the descending colon. The                            polyp was pedunculated. The polyp was removed with                            a cold snare. Resection and retrieval were                            complete. For hemostasis, two hemostatic clips were                            successfully placed. There was no bleeding at the                            end of the  maneuver.                           Two sessile polyps were found in the descending                            colon. The polyps were 5 to 6 mm in size. These                            polyps were removed with a cold snare. Resection                            and retrieval were complete.                           A 9 mm polyp was found in the recto-sigmoid colon.  The polyp was sessile. The polyp was removed with a                            hot snare. Resection and retrieval were complete.                            To prevent bleeding after the polypectomy, one                            hemostatic clip was successfully placed. There was                            no bleeding during, or at the end, of the procedure.                           The Simple Endoscopic Score for Crohn's Disease was                            determined based on the endoscopic appearance of                            the mucosa in the following segments:                           - Ileum: Findings include no ulcers present, no                            ulcerated surfaces, no affected surfaces and no                            narrowings. Segment score: 0.                           - Right Colon: Findings include no ulcers present,                            no ulcerated surfaces, no affected surfaces and no                            narrowings. Segment score: 0.                           - Transverse Colon: Findings include no ulcers                            present, no ulcerated surfaces, no affected                            surfaces and no narrowings. Segment score: 0.                           - Left Colon: Findings include no ulcers present,  no ulcerated surfaces, 50-75% of surfaces affected                            and no narrowings. Segment score: 2.                           - Rectum: Findings include no ulcers present, no                             ulcerated surfaces, no affected surfaces and no                            narrowings. Segment score: 0.                           - Total SES-CD aggregate score: 2. Four biopsies                            were taken every 10 cm with a cold forceps from the                            entire colon for Crohn's disease surveillance.                            These biopsy specimens from the right colon and                            left colon were sent to Pathology.                           Internal hemorrhoids were found during retroflexion. Complications:            No immediate complications. Estimated Blood Loss:     Estimated blood loss was minimal. Impression:               - The examined portion of the ileum was normal.                           - One 4 mm polyp in the cecum, removed with a cold                            snare. Resected and retrieved.                           - One 5 mm polyp in the ascending colon, removed                            with a cold snare. Resected and retrieved.                           - Two 4 to 5 mm polyps in the transverse colon,                            removed  with a cold snare. Resected and retrieved.                           - One 7 mm polyp in the descending colon, removed                            with a cold snare. Resected and retrieved. Clips                            were placed.                           - Two 5 to 6 mm polyps in the descending colon,                            removed with a cold snare. Resected and retrieved.                           - One 9 mm polyp at the recto-sigmoid colon,                            removed with a hot snare. Resected and retrieved.                            Clip was placed.                           - Simple Endoscopic Score for Crohn's Disease: 2,                            mucosal inflammatory changes. Mild inflammation in                            the left colon (descending  colon and sigmoid,                            patchy in nature). Biopsied.                           - Internal hemorrhoids. Recommendation:           - Patient has a contact number available for                            emergencies. The signs and symptoms of potential                            delayed complications were discussed with the                            patient. Return to normal activities tomorrow.                            Written discharge instructions were provided to the  patient.                           - Resume previous diet.                           - Continue present medications.                           - Await pathology results.                           - Repeat colonoscopy is recommended for                            surveillance. The colonoscopy date will be                            determined after pathology results from today's                            exam become available for review.                           - Resume Plavix (clopidogrel) at prior dose                            tomorrow. Refer to managing physician for further                            adjustment of therapy.                           - Return to GI office in 3 months. Jerene Bears, MD 12/16/2018 3:26:10 PM This report has been signed electronically.

## 2018-12-16 NOTE — Patient Instructions (Signed)
Discharge instructions given. Handouts on polyps and hemorrhoids. Card for clips given also. Resume Plavix at prior dose tomorrow. Office will schedule for return office visit in 3 months. Resume previous medications. YOU HAD AN ENDOSCOPIC PROCEDURE TODAY AT [facility]:   Refer to the procedure report that was given to you for any specific questions about what was found during the examination.  If the procedure report does not answer your questions, please call your gastroenterologist to clarify.  If you requested that your care partner not be given the details of your procedure findings, then the procedure report has been included in a sealed envelope for you to review at your convenience later.  YOU SHOULD EXPECT: Some feelings of bloating in the abdomen. Passage of more gas than usual.  Walking can help get rid of the air that was put into your GI tract during the procedure and reduce the bloating. If you had a lower endoscopy (such as a colonoscopy or flexible sigmoidoscopy) you may notice spotting of blood in your stool or on the toilet paper. If you underwent a bowel prep for your procedure, you may not have a normal bowel movement for a few days.  Please Note:  You might notice some irritation and congestion in your nose or some drainage.  This is from the oxygen used during your procedure.  There is no need for concern and it should clear up in a day or so.  SYMPTOMS TO REPORT IMMEDIATELY:   Following lower endoscopy (colonoscopy or flexible sigmoidoscopy):  Excessive amounts of blood in the stool  Significant tenderness or worsening of abdominal pains  Swelling of the abdomen that is new, acute  Fever of 100F or higher   Following upper endoscopy (EGD)  Vomiting of blood or coffee ground material  New chest pain or pain under the shoulder blades  Painful or persistently difficult swallowing  New shortness of breath  Fever of 100F or higher  Black,  tarry-looking stools  For urgent or emergent issues, a gastroenterologist can be reached at any hour by calling [phone].   DIET:  We do recommend a small meal at first, but then you may proceed to your regular diet.  Drink plenty of fluids but you should avoid alcoholic beverages for 24 hours.  ACTIVITY:  You should plan to take it easy for the rest of today and you should NOT DRIVE or use heavy machinery until tomorrow (because of the sedation medicines used during the test).    FOLLOW UP: Our staff will call the number listed on your records 48-72 hours following your procedure to check on you and address any questions or concerns that you may have regarding the information given to you following your procedure. If we do not reach you, we will leave a message.  We will attempt to reach you two times.  During this call, we will ask if you have developed any symptoms of COVID 19. If you develop any symptoms (ie: fever, flu-like symptoms, shortness of breath, cough etc.) before then, please call [phone].  If you test positive for Covid 19 in the 2 weeks post procedure, please call and report this information to [place].    If any biopsies were taken you will be contacted by phone or by letter within the next 1-3 weeks.  Please call us at [phone] if you have not heard about the biopsies in 3 weeks.    SIGNATURES/CONFIDENTIALITY: You and/or your care partner have signed  paperwork which will be entered into your electronic medical record.  These signatures attest to the fact that that the information above on your After Visit Summary has been reviewed and is understood.  Full responsibility of the confidentiality of this discharge information lies with you and/or your care-partner.

## 2018-12-16 NOTE — Progress Notes (Signed)
Temp check by JB/vital check by CW.  No changes in medical history since pre-visit screening on 9/3.

## 2018-12-16 NOTE — Op Note (Signed)
[place] Patient Name: Jack [name] Procedure Date: 12/16/2018 2:21 PM MRN: [id number] Endoscopist: Jerene Bears , MD Age: 53 Referring MD:  Date of Birth: 07/18/1965 Gender: Male Account #: [id number] Procedure:                Upper GI endoscopy Indications:              Gastro-esophageal reflux disease, Crohn's disease,                            Diarrhea Medicines:                Monitored Anesthesia Care Procedure:                Pre-Anesthesia Assessment:                           - Prior to the procedure, a History and Physical                            was performed, and patient medications and                            allergies were reviewed. The patient's tolerance of                            previous anesthesia was also reviewed. The risks                            and benefits of the procedure and the sedation                            options and risks were discussed with the patient.                            All questions were answered, and informed consent                            was obtained. Prior Anticoagulants: The patient has                            taken Plavix (clopidogrel), last dose was 5 days                            prior to procedure. ASA Grade Assessment: III - A                            patient with severe systemic disease. After                            reviewing the risks and benefits, the patient was                            deemed in satisfactory condition to undergo the  procedure.                           After obtaining informed consent, the endoscope was                            passed under direct vision. Throughout the                            procedure, the patient's blood pressure, pulse, and                            oxygen saturations were monitored continuously. The                            Endoscope was introduced through the mouth, and                             advanced to the second part of duodenum. The upper                            GI endoscopy was accomplished without difficulty.                            The patient tolerated the procedure well. Scope In: Scope Out: Findings:                 The examined esophagus was normal.                           Moderate inflammation characterized by erosions and                            erythema was found in the gastric body. Biopsies                            were taken with a cold forceps for histology and                            Helicobacter pylori testing.                           The examined duodenum was normal. Biopsies for                            histology were taken with a cold forceps for                            evaluation of diarrhea and to exclude evidence of                            celiac disease or IBD. Complications:            No immediate complications. Estimated Blood Loss:     Estimated blood loss was minimal. Impression:               -  Normal esophagus.                           - Gastritis. Biopsied.                           - Normal examined duodenum. Biopsied. Recommendation:           - Patient has a contact number available for                            emergencies. The signs and symptoms of potential                            delayed complications were discussed with the                            patient. Return to normal activities tomorrow.                            Written discharge instructions were provided to the                            patient.                           - Resume previous diet.                           - Continue present medications.                           - Await pathology results.                           - See the other procedure note for documentation of                            additional recommendations. Jerene Bears, MD 12/16/2018 3:17:57 PM This report has been signed electronically.

## 2018-12-16 NOTE — Progress Notes (Signed)
Report to PACU, RN, vss, BBS= Clear.  

## 2018-12-17 ENCOUNTER — Telehealth: Payer: Self-pay | Admitting: *Deleted

## 2018-12-17 DIAGNOSIS — R972 Elevated prostate specific antigen [PSA]: Secondary | ICD-10-CM | POA: Diagnosis not present

## 2018-12-17 NOTE — Telephone Encounter (Signed)
   [facility] Pre-operative Risk Assessment    Request for surgical clearance:  1. What type of surgery is being performed? PROSTATE BX   2. When is this surgery scheduled? 01/07/19   3. What type of clearance is required (medical clearance vs. Pharmacy clearance to hold med vs. Both)? MEDICAL  4. Are there any medications that need to be held prior to surgery and how long? PLAVIX X 5 DAYS PRIOR  5. Practice name and name of physician performing surgery? [facility]; DR. [place]   6. What is your office phone number [phone]    7.   What is your office fax number [phone]  8.   Anesthesia type (None, local, MAC, general) ? NOT LISTED. LEFT MESSAGE TO CONFIRM TYPE OF ANESTHESIA    Jack [name] 12/17/2018, 4:28 PM  _________________________________________________________________   (provider comments below)

## 2018-12-17 NOTE — Telephone Encounter (Signed)
Dr. Tamala Julian  Can you please address this patient's Plavix therapy?  He is scheduled for prostate biopsy 01/07/2019 in which the surgical team is requesting he hold his Plavix for 5 days prior to procedure.  He has a history of eosinophilic granulomatosis, history of GI bleeding, essential hypertension, seizure disorder, Crohn's disease, and known CAD.   He presented with ST elevation myocardial infarction involving the LAD territory in September 2019. Alternate decrease in LV systolic function with EF approximately 35% and LV thrombusrequiring Coumadin therapy. Had DES to the LAD and has residual moderate CAD in circumflex and RCA territory.Developed post infarct pericarditis.Has subsequently had pause of antiplatelet therapy to treat lumbar disk without complication  He was last seen by Cecille Rubin 11/22/2018 and was doing very well from a cardiac standpoint.   Please route your recommendations to the preoperative pool   Thank you Sharee Pimple

## 2018-12-18 NOTE — Telephone Encounter (Signed)
Okay to hold plavix for colonoscopy.

## 2018-12-19 NOTE — Telephone Encounter (Signed)
   Primary Cardiologist: Sinclair Grooms, MD  Chart reviewed as part of pre-operative protocol coverage. Given past medical history and time since last visit, based on ACC/AHA guidelines, TOD ABRAHAMSEN would be at acceptable risk for the planned procedure without further cardiovascular testing.   Per Dr. Tamala Julian, it will be acceptable to hold Plavix 5 days prior to procedure then resume as soon as possible when safe per procedural team.   I will route this recommendation to the requesting party via [facility] fax function and remove from pre-op pool.  Please call with questions.  Kathyrn Drown, NP 12/19/2018, 6:17 AM

## 2018-12-20 ENCOUNTER — Telehealth: Payer: Self-pay

## 2018-12-20 NOTE — Telephone Encounter (Signed)
  Follow up Call-  Call back number 12/16/2018  Post procedure Call Back phone  # [phone]  Permission to leave phone message Yes  Some recent data might be hidden     Patient questions:  Do you have a fever, pain , or abdominal swelling? No. Pain Score  0 *  Have you tolerated food without any problems? Yes.    Have you been able to return to your normal activities? Yes.    Do you have any questions about your discharge instructions: Diet   No. Medications  No. Follow up visit  No.  Do you have questions or concerns about your Care? No.  Actions: * If pain score is 4 or above: No action needed, pain <4.  1. Have you developed a fever since your procedure? no  2.   Have you had an respiratory symptoms (SOB or cough) since your procedure? no  3.   Have you tested positive for COVID 19 since your procedure no  4.   Have you had any family members/close contacts diagnosed with the COVID 19 since your procedure?  no   If yes to any of these questions please route to Joylene John, RN and Alphonsa Gin, Therapist, sports.

## 2018-12-20 NOTE — Telephone Encounter (Signed)
[facility] spoke with Operator she states that she does not see that anyone has called [place] about this pt. As pt was cleared yesterday by Hale Bogus I have left a message for DR. PATRICK MCKENZIE nurse and I will await her CB.

## 2018-12-21 ENCOUNTER — Telehealth: Payer: Self-pay

## 2018-12-21 NOTE — Telephone Encounter (Addendum)
Left voice message for the patient to give me a call back so that I can give him the recommendations about holding his medication for his upcoming procedure.

## 2018-12-21 NOTE — Telephone Encounter (Signed)
Called the requesting office of [facility] and spoke with Bethena Roys in Medical Records she confirmed that the fax was received and that it was addressed to Dr. Alyson Ingles.

## 2018-12-22 NOTE — Telephone Encounter (Signed)
  Wife is returning call

## 2018-12-22 NOTE — Telephone Encounter (Signed)
Patients wife Jack [name] returned my call she is on the patient's DPR. I informed her that her husband [name] needs to hold his Plavix. Mrs. Chew asked if the patient needed to hold his iron tablets and his Vascepa medication. Informed her that I will ask the covering PA of the pre-op pool and will get back with her.

## 2018-12-22 NOTE — Telephone Encounter (Signed)
Patients wife [name] returned my call she is on the patient's DPR. I informed her that her husband [name] needs to hold his Plavix. [name] asked if the patient needed to hold his iron tablets and his Vascepa medication. Informed her that I will ask the covering PA of the pre-op pool and will get back with her.

## 2018-12-27 ENCOUNTER — Other Ambulatory Visit: Payer: Self-pay | Admitting: Internal Medicine

## 2018-12-31 DIAGNOSIS — K5090 Crohn's disease, unspecified, without complications: Secondary | ICD-10-CM | POA: Diagnosis not present

## 2019-01-07 DIAGNOSIS — R972 Elevated prostate specific antigen [PSA]: Secondary | ICD-10-CM | POA: Diagnosis not present

## 2019-01-07 DIAGNOSIS — C61 Malignant neoplasm of prostate: Secondary | ICD-10-CM | POA: Diagnosis not present

## 2019-01-14 DIAGNOSIS — C61 Malignant neoplasm of prostate: Secondary | ICD-10-CM | POA: Diagnosis not present

## 2019-01-28 DIAGNOSIS — K5090 Crohn's disease, unspecified, without complications: Secondary | ICD-10-CM | POA: Diagnosis not present

## 2019-02-02 DIAGNOSIS — D485 Neoplasm of uncertain behavior of skin: Secondary | ICD-10-CM | POA: Diagnosis not present

## 2019-02-15 ENCOUNTER — Telehealth: Payer: Self-pay

## 2019-02-15 NOTE — Telephone Encounter (Signed)
New message    Patient wife request permission to attend appointment with patient , she states that you are aware that he has memory issues?

## 2019-02-15 NOTE — Progress Notes (Signed)
Cardiology Office Note:    Date:  02/16/2019   ID:  URHO RIO, DOB 1966/02/10, MRN [id number]  PCP:  [name], MD  Cardiologist:  Sinclair Grooms, MD   Referring MD: [name], MD   Chief Complaint  Patient presents with  . Coronary Artery Disease    History of Present Illness:    Jack [name] is a 53 y.o. male with a hx of eosinophilic granulomatosis, history of GI bleeding, essential hypertension, seizure disorder, Crohn's disease, who presented November 2019 with ST elevation myocardial infarction involving the LAD territory in September 2019. Alternate decrease in LV systolic function with EF approximately 35% and LV thrombusrequiring Coumadin therapy.  He had significant GI bleeding on the combination of warfarin and dual antiplatelet therapy. Received a drug-eluting stent to the LAD and has residual moderate CAD in circumflex and RCA territory.Developed post infarct pericarditis. Has subsequently had pause of antiplatelet therapy to treat umbar disk.  Since last being seen he has undergone removal of a schwannoma from the lowers spine by Dr. Ellene Route without complications.  He is physically active and doing some running (greater than 11-minute miles).  He denies chest pain, dyspnea, orthopnea, PND, edema, palpitations, and syncope.  Has easy bruising on clopidogrel.  No GI bleeding.  Past Medical History:  Diagnosis Date  . Acquired renal cyst of right kidney 08/31/2018   2.8cm R upper pole rec monitor with yearly imaging (09/2018)  . Allergy   . BPH (benign prostatic hypertrophy)   . CAD (coronary artery disease)    a. anterior STEMI 01/2018 -  proximal occlusion of LAD, treated with DES. Cath also showed 20% distal LM, 95% ostial-prox small-moderate ramus, 70-80% ostial Cx, 70% dominant ostial OM, 50-60% prox Cx, 70% RCA. EF 35% by cath with LVEDP 19mHg. Med rx for residual disease. Course complicated by post MI pericarditis and LV thrombus.   . Chronic systolic (congestive) heart failure ([facility]   . Colitis   . Colon polyp    inflammatory  . Crohn disease ([place] 1992   history uveitis, involvement of intestines and lungs  . Diverticulosis   . Eosinophilic granuloma ([facility]   . Essential hypertension   . GERD (gastroesophageal reflux disease)   . GI bleeding   . History of chicken pox   . History of gastroesophageal reflux (GERD)   . History of seizure 1995   grand mal x1, completed 6 yrs dilantin. no seizures since  . Hyperlipidemia   . Internal hemorrhoids   . Ischemic cardiomyopathy   . LV (left ventricular) mural thrombus following MI ([facility] 01/2018  . Myocardial infarction ([place]   . Osteoporosis 11/2015   DEXA T -2.9  . Schwannoma 2007   L axilla s/p surgery  . Seizures ([facility]    last seizure 1995 and only x 1 seizure-   . Ulnar neuropathy    h/o this from L arm schwannoma    Past Surgical History:  Procedure Laterality Date  . APPENDECTOMY  2000  . BACK SURGERY  2011   lumbar  . CARDIAC CATHETERIZATION    . CARDIOVASCULAR STRESS TEST  01/2015   low risk study  . CHOLECYSTECTOMY  2000  . COLONOSCOPY  08/2014   f/u crohn's, 4 inflammatory polyps, diverticulosis, improved, rpt 2 yrs ([place])  . COLONOSCOPY WITH PROPOFOL N/A 04/09/2018   inflammatory polyp, f/u left to primary GI - Pyrtle ([place], HKirke Corin MD)  . CORONARY ANGIOGRAPHY N/A 02/21/2018   Procedure: CORONARY ANGIOGRAPHY;  Surgeon: Belva Crome, MD;  Location: [place] [facility];  Service: Cardiovascular;  Laterality: N/A;  . CORONARY/GRAFT ACUTE MI REVASCULARIZATION N/A 02/20/2018   Procedure: Coronary/Graft Acute MI Revascularization;  Surgeon: Belva Crome, MD;  Location: [place] [facility];  Service: Cardiovascular;  Laterality: N/A;  . ESOPHAGOGASTRODUODENOSCOPY  07/2014   h/o EE resolved, focal reflux esophagitis, chronic active gastritis  . extremity surgery Left    ulnar nerve repair after schwannoma removal  . FLEXIBLE SIGMOIDOSCOPY  N/A 04/14/2018   Procedure: FLEXIBLE SIGMOIDOSCOPY;  Surgeon: Milus Banister, MD;  Location: [facility] ENDOSCOPY;  Service: Endoscopy;  Laterality: N/A;  . INGUINAL HERNIA REPAIR Bilateral 2017  . LAMINECTOMY N/A 08/09/2018   Procedure: Lumbar three Laminectomy, excision of intradural tumor;  Surgeon: Kristeen Miss, MD;  Location: [place];  Service: Neurosurgery;  Laterality: N/A;  . LEFT HEART CATH AND CORONARY ANGIOGRAPHY N/A 02/20/2018   Procedure: LEFT HEART CATH AND CORONARY ANGIOGRAPHY;  Surgeon: Belva Crome, MD;  Location: [place] [facility];  Service: Cardiovascular;  Laterality: N/A;  . LUMBAR SPINE SURGERY  2011  . POLYPECTOMY  04/09/2018   Procedure: POLYPECTOMY;  Surgeon: Doran Stabler, MD;  Location: [facility] ENDOSCOPY;  Service: Gastroenterology;;  . SPINAL CORD STIMULATOR IMPLANT  2012   x2 (Dr Berton Mount)  . SUBMUCOSAL INJECTION  04/09/2018   Procedure: SUBMUCOSAL INJECTION;  Surgeon: Doran Stabler, MD;  Location: [facility] ENDOSCOPY;  Service: Gastroenterology;;  . TONSILLECTOMY  1996  . TUMOR REMOVAL Left 2007   schwannoma from L armpit  . UPPER GASTROINTESTINAL ENDOSCOPY      Current Medications: Current Meds  Medication Sig  . acetaminophen (TYLENOL) 325 MG tablet Take 650 mg by mouth every 6 (six) hours as needed for mild pain or headache.   . alendronate (FOSAMAX) 70 MG tablet Take 70 mg by mouth every Saturday.   [name] atorvastatin (LIPITOR) 80 MG tablet Take 1 tablet (80 mg total) by mouth daily at 6 PM.  . Calcium Carbonate-Vit D-Min (CALCIUM 1200 PO) Take 1 capsule by mouth 2 (two) times daily.  . carvedilol (COREG) 6.25 MG tablet Take 1 tablet (6.25 mg total) by mouth 2 (two) times daily.  . clopidogrel (PLAVIX) 75 MG tablet TAKE 1 TABLET BY MOUTH EVERY DAY  . diphenoxylate-atropine (LOMOTIL) 2.5-0.025 MG tablet TAKE 1-2 TABLETS BY MOUTH 4 TIMES A DAY AS NEEDED FOR DIARRHEA  . ferrous sulfate 325 (65 FE) MG tablet Take 1 tablet (325 mg total) by mouth daily with breakfast.  .  furosemide (LASIX) 20 MG tablet Take only as needed  . hydrOXYzine (ATARAX/VISTARIL) 25 MG tablet Take 75 mg by mouth at bedtime.  Vanessa Kick Ethyl (VASCEPA) 1 g CAPS Take 2 capsules (2 g total) by mouth 2 (two) times daily.  [name] losartan (COZAAR) 25 MG tablet Take 1 tablet (25 mg total) by mouth daily.  . Multiple Vitamin (MULTIVITAMIN) capsule Take 1 capsule by mouth daily.  . nitroGLYCERIN (NITROSTAT) 0.4 MG SL tablet Place 1 tablet (0.4 mg total) under the tongue every 5 (five) minutes as needed for chest pain.  . pantoprazole (PROTONIX) 40 MG tablet Take 1 tablet (40 mg total) by mouth daily.  . predniSONE (DELTASONE) 10 MG tablet Take 15 mg by mouth daily with breakfast.  . spironolactone (ALDACTONE) 25 MG tablet Take 0.5 tablets (12.5 mg total) by mouth daily.  . tadalafil (CIALIS) 5 MG tablet TAKE ONE TABLET BY MOUTH DAILY AS NEEDED FOR ERECTILE DYSFUNCTION  . tamsulosin (  FLOMAX) 0.4 MG CAPS capsule TAKE 1 CAPSULE (0.4 MG TOTAL) BY MOUTH DAILY.  . vedolizumab (ENTYVIO) 300 MG injection Inject 300 mg into the vein. Every 4 weeks     Allergies:   Cephalexin and Duloxetine   Social History   Socioeconomic History  . Marital status: Married    Spouse name: Mickel Baas  . Number of children: Not on file  . Years of education: 74  . Highest education level: Bachelor's degree (e.g., BA, AB, BS)  Occupational History  . Occupation: Self-employed    Comment: Patient owns 2 [employer]  . Financial resource strain: Not hard at all  . Food insecurity    Worry: Never true    Inability: Never true  . Transportation needs    Medical: No    Non-medical: No  Tobacco Use  . Smoking status: Former Smoker    Types: Cigars    Quit date: 02/20/2018    Years since quitting: 0.9  . Smokeless tobacco: Never Used  Substance and Sexual Activity  . Alcohol use: Yes    Comment: 2-4 beers per week s of 07/19/18  . Drug use: No  . Sexual activity: Not on file  Lifestyle  . Physical  activity    Days per week: 0 days    Minutes per session: 0 min  . Stress: Only a little  Relationships  . Social Herbalist on phone: Not on file    Gets together: Not on file    Attends religious service: Not on file    Active member of club or organization: Not on file    Attends meetings of clubs or organizations: Not on file    Relationship status: Not on file  Other Topics Concern  . Not on file  Social History Narrative   Lives with wife, 1 dog. Grown children   Edu: college   Occ: owns mattress store   Activity: active at work, started [employer], wants to restart running   Diet: good water, fruits/vegetables daily     Family History: The patient's family history includes CAD (age of onset: 32) in his father and mother; Colon cancer in his maternal grandmother; Congenital heart disease in his mother; Crohn's disease in his brother; Hyperlipidemia in his father and mother; Hypertension in his father and mother. There is no history of Diabetes, Colon polyps, Esophageal cancer, Rectal cancer, or Stomach cancer.  ROS:   Please see the history of present illness.    Recent other developments include diagnosis of prostate cancer 1 out of 12 biopsies are positive, low back surgery by Dr. Ellene Route as described above, and relatively stable Crohn's disease.  My he smokes cigars and does not do this any longer.  All other systems reviewed and are negative.  EKGs/Labs/Other Studies Reviewed:    The following studies were reviewed today:  2D Doppler echocardiogram 05/27/2018:  IMPRESSIONS    1. The left ventricle has moderately reduced systolic function, with an ejection fraction of 35-40%. The cavity size was normal. There is mildly increased left ventricular wall thickness. Left ventricular diastolic Doppler parameters are consistent with  impaired relaxation Indeterminent filling pressures The E/e' is 8-15.  2. Severe akinesis of the left ventricular anterior wall,  anteroseptal wall and apical segment.  3. The right ventricle has normal systolic function. The cavity was normal. There is no increase in right ventricular wall thickness.  4. The mitral valve is degenerative. Mild thickening of the mitral valve  leaflet. Mild calcification of the mitral valve leaflet. Mitral valve regurgitation is mild to moderate by color flow Doppler.  5. The aortic valve is tricuspid Mild thickening of the aortic valve Mild calcification of the aortic valve. Aortic valve regurgitation is mild by color flow Doppler.  6. The aortic root and ascending aorta are normal in size and structure.  7. When compared to the prior study: No change compared to prior study in 02/2018.  EKG:  EKG normal sinus rhythm, QS pattern V1 through V4, precordial T wave abnormality.  When compared to the prior tracing from December 2019, the T wave abnormality is much improved.  Recent Labs: 02/20/2018: B Natriuretic Peptide 14.9 03/22/2018: NT-Pro BNP 2,573 04/12/2018: Magnesium 2.1 07/15/2018: TSH 0.86 11/22/2018: ALT 25; BUN 16; Creatinine, Ser 1.45; Potassium 4.5; Sodium 142 11/30/2018: Hemoglobin 13.1; Platelets 275.0  Recent Lipid Panel    Component Value Date/Time   CHOL 124 11/22/2018 0958   TRIG 144 11/22/2018 0958   HDL 32 (L) 11/22/2018 0958   CHOLHDL 3.9 11/22/2018 0958   CHOLHDL 4.5 02/20/2018 2345   VLDL 8 02/20/2018 2345   LDLCALC 63 11/22/2018 0958    Physical Exam:    VS:  BP 118/74   Pulse 64   Ht 5' 7"  (1.702 m)   Wt 176 lb 1.9 oz (79.9 kg)   SpO2 97%   BMI 27.58 kg/m     Wt Readings from Last 3 Encounters:  02/16/19 176 lb 1.9 oz (79.9 kg)  12/16/18 172 lb (78 kg)  12/02/18 172 lb (78 kg)     GEN: Healthy-appearing. No acute distress HEENT: Normal NECK: No JVD. LYMPHATICS: No lymphadenopathy CARDIAC:  RRR without murmur, gallop, or edema. VASCULAR:  Normal Pulses. No bruits. RESPIRATORY:  Clear to auscultation without rales, wheezing or rhonchi  ABDOMEN:  Soft, non-tender, non-distended, No pulsatile mass, MUSCULOSKELETAL: No deformity  SKIN: Warm and dry NEUROLOGIC:  Alert and oriented x 3 PSYCHIATRIC:  Normal affect   ASSESSMENT:    1. CAD in native artery   2. Essential hypertension   3. Chronic systolic CHF (congestive heart failure) ([place])   4. Hyperlipidemia, unspecified hyperlipidemia type   5. Sleep apnea, unspecified type   6. Educated about COVID-19 virus infection    PLAN:    In order of problems listed above:  1. Secondary prevention discussed. 2. Target is being achieved, less than 130/80 mmHg. 3. Most recent echocardiogram in February 2020 was 35 to 40%.  He is on guideline directed therapy which includes carvedilol and losartan.  We will repeat his echocardiogram in February 2021.  Medication titration will be dependent upon findings. 4. Target is less than 70. 5. Encouraged to be compliant with CPAP. 6. The 3W's is being observed but not consistently practice.  The patient is encouraged to be more mindful.  Overall education and awareness concerning primary/secondary risk prevention was discussed in detail: LDL less than 70, hemoglobin A1c less than 7, blood pressure target less than 130/80 mmHg, >150 minutes of moderate aerobic activity per week, avoidance of smoking, weight control (via diet and exercise), and continued surveillance/management of/for obstructive sleep apnea.    Medication Adjustments/Labs and Tests Ordered: Current medicines are reviewed at length with the patient today.  Concerns regarding medicines are outlined above.  Orders Placed This Encounter  Procedures  . EKG 12-Lead   No orders of the defined types were placed in this encounter.   Patient Instructions  Medication Instructions:  Your physician recommends that you  continue on your current medications as directed. Please refer to the Current Medication list given to you today.  *If you need a refill on your cardiac medications before  your next appointment, please call your pharmacy*  Lab Work: None If you have labs (blood work) drawn today and your tests are completely normal, you will receive your results only by: [name] MyChart Message (if you have MyChart) OR . A paper copy in the mail If you have any lab test that is abnormal or we need to change your treatment, we will call you to review the results.  Testing/Procedures: None  Follow-Up: At [facility], you and your health needs are our priority.  As part of our continuing mission to provide you with exceptional heart care, we have created designated Provider Care Teams.  These Care Teams include your primary Cardiologist (physician) and Advanced Practice Providers (APPs -  Physician Assistants and Nurse Practitioners) who all work together to provide you with the care you need, when you need it.  Your next appointment:   9-12 month(s)  The format for your next appointment:   In Person  Provider:   You may see Sinclair Grooms, MD or one of the following Advanced Practice Providers on your designated Care Team:    Truitt Merle, NP  Cecilie Kicks, NP  Kathyrn Drown, NP   Other Instructions      Signed, Sinclair Grooms, MD  02/16/2019 11:04 AM    [place]

## 2019-02-16 ENCOUNTER — Ambulatory Visit: Payer: BC Managed Care – PPO | Admitting: Interventional Cardiology

## 2019-02-16 ENCOUNTER — Other Ambulatory Visit: Payer: Self-pay

## 2019-02-16 ENCOUNTER — Encounter: Payer: Self-pay | Admitting: Interventional Cardiology

## 2019-02-16 VITALS — BP 118/74 | HR 64 | Ht 67.0 in | Wt 176.1 lb

## 2019-02-16 DIAGNOSIS — I2510 Atherosclerotic heart disease of native coronary artery without angina pectoris: Secondary | ICD-10-CM | POA: Diagnosis not present

## 2019-02-16 DIAGNOSIS — E785 Hyperlipidemia, unspecified: Secondary | ICD-10-CM | POA: Diagnosis not present

## 2019-02-16 DIAGNOSIS — I10 Essential (primary) hypertension: Secondary | ICD-10-CM | POA: Diagnosis not present

## 2019-02-16 DIAGNOSIS — I5022 Chronic systolic (congestive) heart failure: Secondary | ICD-10-CM | POA: Diagnosis not present

## 2019-02-16 DIAGNOSIS — G4730 Sleep apnea, unspecified: Secondary | ICD-10-CM

## 2019-02-16 DIAGNOSIS — Z7189 Other specified counseling: Secondary | ICD-10-CM

## 2019-02-16 NOTE — Patient Instructions (Signed)
Medication Instructions:  Your physician recommends that you continue on your current medications as directed. Please refer to the Current Medication list given to you today.  *If you need a refill on your cardiac medications before your next appointment, please call your pharmacy*  Lab Work: None If you have labs (blood work) drawn today and your tests are completely normal, you will receive your results only by: [name] MyChart Message (if you have MyChart) OR . A paper copy in the mail If you have any lab test that is abnormal or we need to change your treatment, we will call you to review the results.  Testing/Procedures: None  Follow-Up: At [facility], you and your health needs are our priority.  As part of our continuing mission to provide you with exceptional heart care, we have created designated Provider Care Teams.  These Care Teams include your primary Cardiologist (physician) and Advanced Practice Providers (APPs -  Physician Assistants and Nurse Practitioners) who all work together to provide you with the care you need, when you need it.  Your next appointment:   9-12 month(s)  The format for your next appointment:   In Person  Provider:   You may see Sinclair Grooms, MD or one of the following Advanced Practice Providers on your designated Care Team:    Truitt Merle, NP  Cecilie Kicks, NP  Kathyrn Drown, NP   Other Instructions

## 2019-02-16 NOTE — Telephone Encounter (Signed)
Left message letting pt know that ok for wife to come up.

## 2019-02-28 ENCOUNTER — Encounter: Payer: Self-pay | Admitting: Family Medicine

## 2019-02-28 DIAGNOSIS — K5090 Crohn's disease, unspecified, without complications: Secondary | ICD-10-CM | POA: Diagnosis not present

## 2019-03-02 ENCOUNTER — Ambulatory Visit (INDEPENDENT_AMBULATORY_CARE_PROVIDER_SITE_OTHER): Payer: BC Managed Care – PPO | Admitting: Family Medicine

## 2019-03-02 ENCOUNTER — Encounter: Payer: Self-pay | Admitting: Family Medicine

## 2019-03-02 ENCOUNTER — Other Ambulatory Visit: Payer: Self-pay

## 2019-03-02 VITALS — BP 122/78 | HR 73 | Temp 97.8°F | Ht 67.0 in | Wt 178.1 lb

## 2019-03-02 DIAGNOSIS — M301 Polyarteritis with lung involvement [Churg-Strauss]: Secondary | ICD-10-CM

## 2019-03-02 DIAGNOSIS — Z23 Encounter for immunization: Secondary | ICD-10-CM | POA: Diagnosis not present

## 2019-03-02 DIAGNOSIS — K50811 Crohn's disease of both small and large intestine with rectal bleeding: Secondary | ICD-10-CM

## 2019-03-02 DIAGNOSIS — C61 Malignant neoplasm of prostate: Secondary | ICD-10-CM | POA: Insufficient documentation

## 2019-03-02 DIAGNOSIS — N1831 Chronic kidney disease, stage 3a: Secondary | ICD-10-CM | POA: Diagnosis not present

## 2019-03-02 DIAGNOSIS — I255 Ischemic cardiomyopathy: Secondary | ICD-10-CM

## 2019-03-02 DIAGNOSIS — K200 Eosinophilic esophagitis: Secondary | ICD-10-CM

## 2019-03-02 DIAGNOSIS — D7218 Eosinophilia in diseases classified elsewhere: Secondary | ICD-10-CM

## 2019-03-02 DIAGNOSIS — M818 Other osteoporosis without current pathological fracture: Secondary | ICD-10-CM

## 2019-03-02 DIAGNOSIS — D72119 Hypereosinophilic syndrome (hes), unspecified: Secondary | ICD-10-CM

## 2019-03-02 LAB — RENAL FUNCTION PANEL
Albumin: 4 g/dL (ref 3.5–5.2)
BUN: 25 mg/dL — ABNORMAL HIGH (ref 6–23)
CO2: 32 mEq/L (ref 19–32)
Calcium: 9.5 mg/dL (ref 8.4–10.5)
Chloride: 103 mEq/L (ref 96–112)
Creatinine, Ser: 1.29 mg/dL (ref 0.40–1.50)
GFR: 58.58 mL/min — ABNORMAL LOW (ref >60.00)
Glucose, Bld: 107 mg/dL — ABNORMAL HIGH (ref 70–99)
Phosphorus: 4.1 mg/dL (ref 2.3–4.6)
Potassium: 5.1 mEq/L (ref 3.5–5.1)
Sodium: 140 mEq/L (ref 135–145)

## 2019-03-02 LAB — CBC WITH DIFFERENTIAL/PLATELET
Basophils Absolute: 0.1 10*3/uL (ref 0.0–0.1)
Basophils Relative: 0.9 % (ref 0.0–3.0)
Eosinophils Absolute: 0.6 10*3/uL (ref 0.0–0.7)
Eosinophils Relative: 4.8 % (ref 0.0–5.0)
HCT: 45 % (ref 39.0–52.0)
Hemoglobin: 14.4 g/dL (ref 13.0–17.0)
Lymphocytes Relative: 12.7 % (ref 12.0–46.0)
Lymphs Abs: 1.5 10*3/uL (ref 0.7–4.0)
MCHC: 32 g/dL (ref 30.0–36.0)
MCV: 87.1 fl (ref 78.0–100.0)
Monocytes Absolute: 1 10*3/uL (ref 0.1–1.0)
Monocytes Relative: 8.3 % (ref 3.0–12.0)
Neutro Abs: 8.5 10*3/uL — ABNORMAL HIGH (ref 1.4–7.7)
Neutrophils Relative %: 73.3 % (ref 43.0–77.0)
Platelets: 260 10*3/uL (ref 150.0–400.0)
RBC: 5.17 Mil/uL (ref 4.22–5.81)
RDW: 15.3 % (ref 11.5–15.5)
WBC: 11.7 10*3/uL — ABNORMAL HIGH (ref 4.0–10.5)

## 2019-03-02 LAB — VITAMIN D 25 HYDROXY (VIT D DEFICIENCY, FRACTURES): VITD: 35.53 ng/mL (ref 30.00–100.00)

## 2019-03-02 MED ORDER — TADALAFIL 10 MG PO TABS: 10.0000 mg | ORAL_TABLET | Freq: Every day | ORAL | 3 refills | Status: DC | PRN

## 2019-03-02 MED ORDER — PREDNISONE 1 MG PO TABS: ORAL_TABLET | ORAL | 1 refills | Status: DC

## 2019-03-02 MED ORDER — FERROUS SULFATE 325 (65 FE) MG PO TABS: 325.0000 mg | ORAL_TABLET | ORAL | 0 refills | Status: DC

## 2019-03-02 MED ORDER — CALCIUM CARBONATE-VITAMIN D3 600-400 MG-UNIT PO TABS: 1.0000 | ORAL_TABLET | Freq: Every day | ORAL | Status: DC

## 2019-03-02 NOTE — Progress Notes (Signed)
This visit was conducted in person.  BP 122/78 (BP Location: Left Arm, Patient Position: Sitting, Cuff Size: Normal)   Pulse 73   Temp 97.8 F (36.6 C) (Temporal)   Ht 5' 7"  (1.702 m)   Wt 178 lb 2 oz (80.8 kg)   SpO2 98%   BMI 27.90 kg/m    CC: 6 mo f/u visit Subjective:    Patient ID: Jack [name], male    DOB: June 24, 1965, 53 y.o.   MRN: [id number]  HPI: Jack [name] is a 53 y.o. male presenting on 03/02/2019 for Follow-up (Here for 6 mo f/u.  ) and Medication Refill (Needs rx for 1 mg prednisone. )   Known h/o eosinophilic granulomatosis, seizure disorder, crohn's disease, and CAD s/p STEMI of LAD 01/2018 s/p DES with resultant ischemic cardiomyopathy ER 35% with LV thrombus s/p coumadin now only on plavix due to GI bleed. 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 He did have lumbar spine schwannoma removed (Elsner).   Saw cardiology last month Tamala Julian).  OSA on CPAP.  Saw GI (Pyrtle) 09/2018. On entyvio and prednisone for crohn's.  Latest notes reviewed. Sees [facility] pulm, ENT, rheumatology. Undergoing 38m/mo taper, currently on 14 mg daily. Requests local rheum referral.   Saw urology (Dr MAlyson Ingles 11/2018 for elevated PSA had prostate biopsy - diagnosed with prostate cancer, has f/u planned 04/2019.   Rare missed meds - usually morning dose. If he misses flomax, he notes significant weakening of stream.     Relevant past medical, surgical, family and social history reviewed and updated as indicated. Interim medical history since our last visit reviewed. Allergies and medications reviewed and updated. Outpatient Medications Prior to Visit  Medication Sig Dispense Refill  . acetaminophen (TYLENOL) 325 MG tablet Take 650 mg by mouth every 6 (six) hours as needed for mild pain or headache.     . alendronate (FOSAMAX) 70 MG tablet Take 70 mg by mouth every Saturday.   11  . atorvastatin (LIPITOR) 80 MG tablet Take 1 tablet (80 mg total) by mouth daily at 6 PM. 90 tablet 3  .  carvedilol (COREG) 6.25 MG tablet Take 1 tablet (6.25 mg total) by mouth 2 (two) times daily. 180 tablet 3  . clopidogrel (PLAVIX) 75 MG tablet TAKE 1 TABLET BY MOUTH EVERY DAY 90 tablet 2  . diphenoxylate-atropine (LOMOTIL) 2.5-0.025 MG tablet TAKE 1-2 TABLETS BY MOUTH 4 TIMES A DAY AS NEEDED FOR DIARRHEA 90 tablet 2  . furosemide (LASIX) 20 MG tablet Take only as needed 90 tablet 3  . hydrOXYzine (ATARAX/VISTARIL) 25 MG tablet Take 75 mg by mouth at bedtime.    .Vanessa KickEthyl (VASCEPA) 1 g CAPS Take 2 capsules (2 g total) by mouth 2 (two) times daily. 120 capsule 11  . losartan (COZAAR) 25 MG tablet Take 1 tablet (25 mg total) by mouth daily. 90 tablet 3  . Multiple Vitamin (MULTIVITAMIN) capsule Take 1 capsule by mouth daily.    . pantoprazole (PROTONIX) 40 MG tablet Take 1 tablet (40 mg total) by mouth daily. 90 tablet 3  . predniSONE (DELTASONE) 10 MG tablet Take 15 mg by mouth daily with breakfast. Taking 14 mg; 1 mg taper per month    . spironolactone (ALDACTONE) 25 MG tablet Take 0.5 tablets (12.5 mg total) by mouth daily. 90 tablet 3  . tamsulosin (FLOMAX) 0.4 MG CAPS capsule TAKE 1 CAPSULE (0.4 MG TOTAL) BY MOUTH DAILY. 90 capsule 3  . vedolizumab (ENTYVIO) 300 MG injection Inject 300 mg  into the vein. Every 4 weeks    . Calcium Carbonate-Vit D-Min (CALCIUM 1200 PO) Take 1 capsule by mouth 2 (two) times daily.    . ferrous sulfate 325 (65 FE) MG tablet Take 1 tablet (325 mg total) by mouth daily with breakfast. 1 tablet 0  . tadalafil (CIALIS) 5 MG tablet TAKE ONE TABLET BY MOUTH DAILY AS NEEDED FOR ERECTILE DYSFUNCTION 20 tablet 5  . nitroGLYCERIN (NITROSTAT) 0.4 MG SL tablet Place 1 tablet (0.4 mg total) under the tongue every 5 (five) minutes as needed for chest pain. 25 tablet 12   Facility-Administered Medications Prior to Visit  Medication Dose Route Frequency Provider Last Rate Last Dose  . ondansetron (ZOFRAN) 4 mg in sodium chloride 0.9 % 50 mL IVPB  4 mg Intravenous Q6H PRN  Kristeen Miss, MD         Per HPI unless specifically indicated in ROS section below Review of Systems Objective:    BP 122/78 (BP Location: Left Arm, Patient Position: Sitting, Cuff Size: Normal)   Pulse 73   Temp 97.8 F (36.6 C) (Temporal)   Ht 5' 7"  (1.702 m)   Wt 178 lb 2 oz (80.8 kg)   SpO2 98%   BMI 27.90 kg/m   Wt Readings from Last 3 Encounters:  03/02/19 178 lb 2 oz (80.8 kg)  02/16/19 176 lb 1.9 oz (79.9 kg)  12/16/18 172 lb (78 kg)    Physical Exam Vitals signs and nursing note reviewed.  Constitutional:      General: He is not in acute distress.    Appearance: Normal appearance. He is not ill-appearing.  Eyes:     Extraocular Movements: Extraocular movements intact.     Pupils: Pupils are equal, round, and reactive to light.  Cardiovascular:     Rate and Rhythm: Normal rate and regular rhythm.     Pulses: Normal pulses.     Heart sounds: Normal heart sounds. No murmur.  Pulmonary:     Effort: Pulmonary effort is normal. No respiratory distress.     Breath sounds: Normal breath sounds. No wheezing, rhonchi or rales.  Neurological:     Mental Status: He is alert.  Psychiatric:        Mood and Affect: Mood normal.        Behavior: Behavior normal.       Results for orders placed or performed in visit on 11/30/18  IBC + Ferritin  Result Value Ref Range   Iron 72 42 - 165 ug/dL   Transferrin 237.0 212.0 - 360.0 mg/dL   Saturation Ratios 21.7 20.0 - 50.0 %   Ferritin 40.3 22.0 - 322.0 ng/mL  CBC with Differential/Platelet  Result Value Ref Range   WBC 14.5 (H) 4.0 - 10.5 K/uL   RBC 4.84 4.22 - 5.81 Mil/uL   Hemoglobin 13.1 13.0 - 17.0 g/dL   HCT 40.6 39.0 - 52.0 %   MCV 84.0 78.0 - 100.0 fl   MCHC 32.2 30.0 - 36.0 g/dL   RDW 16.6 (H) 11.5 - 15.5 %   Platelets 275.0 150.0 - 400.0 K/uL   Neutrophils Relative % 59.2 43.0 - 77.0 %   Lymphocytes Relative 20.2 12.0 - 46.0 %   Monocytes Relative 15.0 (H) 3.0 - 12.0 %   Eosinophils Relative 4.5 0.0 - 5.0  %   Basophils Relative 1.1 0.0 - 3.0 %   Neutro Abs 8.6 (H) 1.4 - 7.7 K/uL   Lymphs Abs 2.9 0.7 - 4.0 K/uL  Monocytes Absolute 2.2 (H) 0.1 - 1.0 K/uL   Eosinophils Absolute 0.7 0.0 - 0.7 K/uL   Basophils Absolute 0.2 (H) 0.0 - 0.1 K/uL   Assessment & Plan:  This visit occurred during the SARS-CoV-2 public health emergency.  Safety protocols were in place, including screening questions prior to the visit, additional usage of staff PPE, and extensive cleaning of exam room while observing appropriate contact time as indicated for disinfecting solutions.   Problem List Items Addressed This Visit    RESOLVED: Stage 3a chronic kidney disease - Primary   Relevant Orders   CBC with Differential   Renal panel   vit d   Prostate cancer [facility])    Recent diagnosis - planned f/u early 2021 to discuss treatment plan. Appreciate urology care.       Relevant Medications   predniSONE (DELTASONE) 1 MG tablet   Osteoporosis    Continue calcium, vit D, fosamax weekly.  Discussed calcium dosing. In known CAD, rec most calcium from diet for goal 122m/day, will decrease calcium supplements to 1 tab/day.       Relevant Medications   Calcium Carbonate-Vitamin D3 (CALCIUM 600/VITAMIN D) 600-400 MG-UNIT TABS   Ischemic cardiomyopathy    Appreciate cards care. Planning to rpt echo early 2021.       Relevant Medications   tadalafil (CIALIS) 10 MG tablet   Hypereosinophilic syndrome    Continues slow prednisone taper 1 mg / month, currently on 147mdose. Requests 53m33mrednisone refills. [facility] but doesn't like that he sees new resident every few years, requests GSORandferral to establish more locally - referral placed.       Relevant Medications   predniSONE (DELTASONE) 1 MG tablet   Other Relevant Orders   Ambulatory referral to Rheumatology   Eosinophilic esophagitis    Continue protonix daily.      Crohn disease ([place]  Appreciate GI care on entyvio.        Churg-Strauss syndrome with lung involvement ([place] Relevant Medications   tadalafil (CIALIS) 10 MG tablet   Other Relevant Orders   Ambulatory referral to Rheumatology    Other Visit Diagnoses    Need for influenza vaccination       Relevant Orders   Flu Vaccine QUAD 36+ mos IM (Completed)       Meds ordered this encounter  Medications  . ferrous sulfate 325 (65 FE) MG tablet    Sig: Take 1 tablet (325 mg total) by mouth every other day.    Dispense:  1 tablet    Refill:  0  . Calcium Carbonate-Vitamin D3 (CALCIUM 600/VITAMIN D) 600-400 MG-UNIT TABS    Sig: Take 1 tablet by mouth daily.  . tadalafil (CIALIS) 10 MG tablet    Sig: Take 1 tablet (10 mg total) by mouth daily as needed for erectile dysfunction.    Dispense:  20 tablet    Refill:  3  . predniSONE (DELTASONE) 1 MG tablet    Sig: Take with 52m53mse - undergoing monthly taper - currently on 14 mg per day    Dispense:  120 tablet    Refill:  1   Orders Placed This Encounter  Procedures  . Flu Vaccine QUAD 36+ mos IM  . CBC with Differential  . Renal panel  . vit d  . Ambulatory referral to Rheumatology    Referral Priority:   Routine    Referral Type:   Consultation  Referral Reason:   Specialty Services Required    Requested Specialty:   Rheumatology    Number of Visits Requested:   1    Follow up plan: Return in about 6 months (around 08/31/2019) for annual exam, prior fasting for blood work.  [name], MD

## 2019-03-02 NOTE — Assessment & Plan Note (Signed)
Continues slow prednisone taper 1 mg / month, currently on 54m dose. Requests 177mprednisone refills. [facility] but doesn't like that he sees new resident every few years, requests GSKootenaieferral to establish more locally - referral placed.

## 2019-03-02 NOTE — Assessment & Plan Note (Signed)
Update renal panel

## 2019-03-02 NOTE — Assessment & Plan Note (Signed)
Appreciate GI care on entyvio.

## 2019-03-02 NOTE — Assessment & Plan Note (Signed)
Continue calcium, vit D, fosamax weekly.  Discussed calcium dosing. In known CAD, rec most calcium from diet for goal 1238m/day, will decrease calcium supplements to 1 tab/day.

## 2019-03-02 NOTE — Assessment & Plan Note (Signed)
>>  ASSESSMENT AND PLAN FOR ISCHEMIC CARDIOMYOPATHY WRITTEN ON 03/02/2019 10:55 AM BY [name], MD  Appreciate cards care. Planning to rpt echo early 2021.

## 2019-03-02 NOTE — Assessment & Plan Note (Signed)
Appreciate cards care. Planning to rpt echo early 2021.

## 2019-03-02 NOTE — Assessment & Plan Note (Signed)
Recent diagnosis - planned f/u early 2021 to discuss treatment plan. Appreciate urology care.

## 2019-03-02 NOTE — Patient Instructions (Addendum)
Flu shot today Labs today Decrease iron to every other day - will check iron stores next blood work.  Decrease calcium 662m /vit D 400 units tablets to one tablet daily.  Goal calcium is 12063m/day.  We will refer you to local rheumatologist.  I have refilled prednisone 13m32mose for taper

## 2019-03-02 NOTE — Assessment & Plan Note (Signed)
Continue protonix daily.

## 2019-03-18 ENCOUNTER — Other Ambulatory Visit: Payer: Self-pay | Admitting: Interventional Cardiology

## 2019-03-18 ENCOUNTER — Other Ambulatory Visit: Payer: Self-pay | Admitting: Family Medicine

## 2019-03-19 ENCOUNTER — Other Ambulatory Visit: Payer: Self-pay | Admitting: Physician Assistant

## 2019-03-28 DIAGNOSIS — K5090 Crohn's disease, unspecified, without complications: Secondary | ICD-10-CM | POA: Diagnosis not present

## 2019-04-01 DIAGNOSIS — C61 Malignant neoplasm of prostate: Secondary | ICD-10-CM

## 2019-04-01 HISTORY — DX: Malignant neoplasm of prostate: C61

## 2019-04-14 ENCOUNTER — Ambulatory Visit: Payer: BC Managed Care – PPO | Attending: Internal Medicine

## 2019-04-14 DIAGNOSIS — Z20822 Contact with and (suspected) exposure to covid-19: Secondary | ICD-10-CM | POA: Insufficient documentation

## 2019-04-15 LAB — NOVEL CORONAVIRUS, NAA: SARS-CoV-2, NAA: NOT DETECTED

## 2019-04-21 ENCOUNTER — Other Ambulatory Visit: Payer: Self-pay | Admitting: Family Medicine

## 2019-04-25 ENCOUNTER — Telehealth: Payer: Self-pay

## 2019-04-25 DIAGNOSIS — C61 Malignant neoplasm of prostate: Secondary | ICD-10-CM | POA: Diagnosis not present

## 2019-04-25 NOTE — Telephone Encounter (Signed)
I did a Vasce[employer] PA through covermymeds and received an immediate approval per message as follows:   Elroy Channel Key: [place] - PA Case ID: [id number] - Rx #: [id number] Approved today  PA Case: [id number]  Status: Approved  Coverage Starts on: 04/25/2019 12:00:00 AM, Coverage Ends on: 04/24/2020 12:00:00 AM.  Drug Icosapent Ethyl 1GM capsules  Form Librarian, academic PA Form [phone] NCPDP)  Original Claim [phone] SUBMIT PA REQUEST [email or web address] PRIOR AUTHORIZATION   I have notified [facility] of this approval.

## 2019-04-28 DIAGNOSIS — C61 Malignant neoplasm of prostate: Secondary | ICD-10-CM | POA: Diagnosis not present

## 2019-05-02 DIAGNOSIS — K5090 Crohn's disease, unspecified, without complications: Secondary | ICD-10-CM | POA: Diagnosis not present

## 2019-05-11 ENCOUNTER — Other Ambulatory Visit: Payer: Self-pay

## 2019-05-11 ENCOUNTER — Ambulatory Visit (HOSPITAL_COMMUNITY): Payer: BC Managed Care – PPO | Attending: Interventional Cardiology

## 2019-05-11 DIAGNOSIS — I5022 Chronic systolic (congestive) heart failure: Secondary | ICD-10-CM

## 2019-05-12 ENCOUNTER — Other Ambulatory Visit: Payer: Self-pay

## 2019-05-12 MED ORDER — DIPHENOXYLATE-ATROPINE 2.5-0.025 MG PO TABS: ORAL_TABLET | ORAL | 1 refills | Status: DC

## 2019-05-12 NOTE — Telephone Encounter (Signed)
Patient's wife requested refill for patient's Lomotil. Refill has been okayed by Dr. [name]. #90 with 1 refill has been faxed to the patient's pharmacy.

## 2019-05-13 ENCOUNTER — Telehealth: Payer: Self-pay | Admitting: *Deleted

## 2019-05-13 DIAGNOSIS — I5022 Chronic systolic (congestive) heart failure: Secondary | ICD-10-CM

## 2019-05-13 MED ORDER — ENTRESTO 24-26 MG PO TABS: 1.0000 | ORAL_TABLET | Freq: Two times a day (BID) | ORAL | 2 refills | Status: DC

## 2019-05-13 NOTE — Telephone Encounter (Signed)
-----   Message from Belva Crome, MD sent at 05/12/2019  5:19 PM EST ----- Let the patient know the gram demonstrates stable, mildly to moderately decreased LV function.  Ejection fraction is 35 to 40%.  If blood pressure will tolerate, we need to uptitrate guideline directed heart failure therapy.  This recommendation is to discontinue spironolactone and losartan.  24 hours later, start Entresto 24/26 mg twice daily.  Basic metabolic panel 1 week later.  Hope to resume spironolactone after on a stable dose of Entresto.  The purpose for the change is to further strengthen protection against progressive weakness of the heart pumping action. A copy will be sent to [name], MD

## 2019-05-13 NOTE — Telephone Encounter (Signed)
I spoke with patient's wife and reviewed results and recommendations from Dr Tamala Julian with her.  Will also send comments through my chart. Patient will start [facility] tomorrow. He will come in for BMP on 05/23/19 at 12:00. His wife will check his BP daily about 2 hours after taking Entresto and will send these readings to the office in a week.  [facility] [employer] [facility]

## 2019-05-16 ENCOUNTER — Telehealth: Payer: Self-pay

## 2019-05-16 NOTE — Telephone Encounter (Signed)
**Note De-Identified  Obfuscation** I did a Entresto PA through covermymeds and received the following message: Renner Sebald Key: [facility] - PA Case ID: [id number] Outcome  Approved today  PA Case: [id number] Status: Approved Coverage Starts on: 05/16/2019 12:00:00 AM, Coverage Ends on: 05/15/2020 12:00:00 AM.  DrugEntresto 24-26MG tablets  [employer] PA Form [phone] NCPDP)    I have notified [facility] of this approval.

## 2019-05-19 ENCOUNTER — Other Ambulatory Visit: Payer: Self-pay | Admitting: Family Medicine

## 2019-05-20 NOTE — Telephone Encounter (Signed)
Prednisone Last filled:  04/22/19, #120 Last OV:  03/02/19, 6 mo f/u Next OV:  none

## 2019-05-20 NOTE — Telephone Encounter (Signed)
ERx 

## 2019-05-22 ENCOUNTER — Other Ambulatory Visit: Payer: Self-pay | Admitting: Internal Medicine

## 2019-05-23 ENCOUNTER — Other Ambulatory Visit: Payer: BC Managed Care – PPO

## 2019-05-24 ENCOUNTER — Other Ambulatory Visit: Payer: Self-pay

## 2019-05-24 NOTE — Telephone Encounter (Signed)
[facility] [address] stating the directions on rx for prednisone 1 mg tab are unclear.  They requests new rx with how many tabs pt should take.

## 2019-05-24 NOTE — Telephone Encounter (Signed)
Please check with patient on what dose he's currently taking as he's been undergoing taper.

## 2019-05-25 MED ORDER — PREDNISONE 1 MG PO TABS: 2.0000 mg | ORAL_TABLET | Freq: Every day | ORAL | 6 refills | Status: DC

## 2019-05-25 NOTE — Telephone Encounter (Signed)
New Rx sent in I only sent in 82m tablets. Is he getting 541mand 1078mrom another provider?

## 2019-05-25 NOTE — Telephone Encounter (Signed)
Patient returned your call.

## 2019-05-25 NOTE — Telephone Encounter (Signed)
Lvm asking pt to call back.  Need to find out what prednisone dose he is currently taking.

## 2019-05-25 NOTE — Telephone Encounter (Signed)
Lvm asking pt to call back.  Need to relay Dr. G's message.  

## 2019-05-25 NOTE — Telephone Encounter (Signed)
Pt returning my call.  States he is taking 17 mg (2- 1 mg, 1- 5 mg, 1- 10 mg).  FYI to Dr. Darnell Level.

## 2019-05-26 ENCOUNTER — Other Ambulatory Visit: Payer: BC Managed Care – PPO

## 2019-05-26 ENCOUNTER — Other Ambulatory Visit: Payer: Self-pay

## 2019-05-26 DIAGNOSIS — I5022 Chronic systolic (congestive) heart failure: Secondary | ICD-10-CM | POA: Diagnosis not present

## 2019-05-26 NOTE — Telephone Encounter (Signed)
Lvm for pt to call back.  Need to relay Dr. Synthia Innocent message.

## 2019-05-27 DIAGNOSIS — Z79899 Other long term (current) drug therapy: Secondary | ICD-10-CM | POA: Diagnosis not present

## 2019-05-27 DIAGNOSIS — Z7952 Long term (current) use of systemic steroids: Secondary | ICD-10-CM | POA: Diagnosis not present

## 2019-05-27 DIAGNOSIS — K5090 Crohn's disease, unspecified, without complications: Secondary | ICD-10-CM | POA: Diagnosis not present

## 2019-05-27 DIAGNOSIS — I776 Arteritis, unspecified: Secondary | ICD-10-CM | POA: Diagnosis not present

## 2019-05-27 LAB — BASIC METABOLIC PANEL
BUN/Creatinine Ratio: 18 (ref 9–20)
BUN: 20 mg/dL (ref 6–24)
CO2: 25 mmol/L (ref 20–29)
Calcium: 9.2 mg/dL (ref 8.7–10.2)
Chloride: 104 mmol/L (ref 96–106)
Creatinine, Ser: 1.13 mg/dL (ref 0.76–1.27)
GFR calc Af Amer: 87 mL/min/{1.73_m2} (ref >59)
GFR calc non Af Amer: 75 mL/min/{1.73_m2} (ref >59)
Glucose: 106 mg/dL — ABNORMAL HIGH (ref 65–99)
Potassium: 4.5 mmol/L (ref 3.5–5.2)
Sodium: 144 mmol/L (ref 134–144)

## 2019-05-29 ENCOUNTER — Encounter: Payer: Self-pay | Admitting: Family Medicine

## 2019-05-29 DIAGNOSIS — I77819 Aortic ectasia, unspecified site: Secondary | ICD-10-CM | POA: Insufficient documentation

## 2019-05-30 DIAGNOSIS — K5090 Crohn's disease, unspecified, without complications: Secondary | ICD-10-CM | POA: Diagnosis not present

## 2019-05-31 NOTE — Telephone Encounter (Signed)
Lvm for pt to call back.  Need to relay Dr. Synthia Innocent message.

## 2019-06-02 NOTE — Telephone Encounter (Signed)
Spoke with pt relaying Dr. Synthia Innocent message.  Verbalizes understanding.  States Dr. [name] was prescribing the 5 mg and 10 mg tabs but no longer wants to.  So pt is checking to see if rheumatology will prescribe.  As of 05/30/19, pt is taking 16 mg daily.  Per rheum, pt will do this for 1 mo then titrate down on 06/30/19 to 15 mg and stay there for awhile.  FYI to Dr. Darnell Level.

## 2019-06-07 ENCOUNTER — Other Ambulatory Visit: Payer: Self-pay | Admitting: Interventional Cardiology

## 2019-06-19 ENCOUNTER — Other Ambulatory Visit: Payer: Self-pay | Admitting: Internal Medicine

## 2019-06-28 DIAGNOSIS — K5090 Crohn's disease, unspecified, without complications: Secondary | ICD-10-CM | POA: Diagnosis not present

## 2019-07-26 ENCOUNTER — Other Ambulatory Visit: Payer: Self-pay | Admitting: Interventional Cardiology

## 2019-07-27 DIAGNOSIS — K5090 Crohn's disease, unspecified, without complications: Secondary | ICD-10-CM | POA: Diagnosis not present

## 2019-08-09 NOTE — Telephone Encounter (Signed)
Please have patient submit a fecal calprotectin to evaluate Crohn's activity, GI pathogen/profile panel We need to perform an Entyvio level and antibody and please ensure that he has continued dosing Entyvio every 4 weeks.  Is he on schedule with Entyvio?  Has he missed doses? He should continue the prednisone at 20 mg daily (which he recently increased) Once he submits the stool studies he can use the Lomotil to tablets 4 times daily  Is he using cholestyramine?

## 2019-08-10 ENCOUNTER — Other Ambulatory Visit: Payer: Self-pay

## 2019-08-10 DIAGNOSIS — K50811 Crohn's disease of both small and large intestine with rectal bleeding: Secondary | ICD-10-CM

## 2019-08-10 DIAGNOSIS — R197 Diarrhea, unspecified: Secondary | ICD-10-CM

## 2019-08-11 ENCOUNTER — Other Ambulatory Visit: Payer: Self-pay | Admitting: Interventional Cardiology

## 2019-08-11 MED ORDER — ENTRESTO 24-26 MG PO TABS: 1.0000 | ORAL_TABLET | Freq: Two times a day (BID) | ORAL | 1 refills | Status: DC

## 2019-08-22 ENCOUNTER — Encounter: Payer: Self-pay | Admitting: Family Medicine

## 2019-08-22 MED ORDER — TADALAFIL 10 MG PO TABS: 10.0000 mg | ORAL_TABLET | Freq: Every day | ORAL | 3 refills | Status: DC | PRN

## 2019-08-22 MED ORDER — HYDROXYZINE HCL 25 MG PO TABS: 75.0000 mg | ORAL_TABLET | Freq: Every day | ORAL | 2 refills | Status: DC

## 2019-08-22 NOTE — Telephone Encounter (Signed)
Prednisone 1 mg  Last filled:  05/25/19, #60 Last OV:  03/02/19, 6 mo f/u Next OV:  none

## 2019-08-23 ENCOUNTER — Telehealth: Payer: Self-pay

## 2019-08-23 ENCOUNTER — Other Ambulatory Visit (INDEPENDENT_AMBULATORY_CARE_PROVIDER_SITE_OTHER): Payer: BC Managed Care – PPO

## 2019-08-23 DIAGNOSIS — K5090 Crohn's disease, unspecified, without complications: Secondary | ICD-10-CM | POA: Diagnosis not present

## 2019-08-23 DIAGNOSIS — K50811 Crohn's disease of both small and large intestine with rectal bleeding: Secondary | ICD-10-CM

## 2019-08-23 LAB — COMPREHENSIVE METABOLIC PANEL
ALT: 22 U/L (ref 0–53)
AST: 22 U/L (ref 0–37)
Albumin: 3.8 g/dL (ref 3.5–5.2)
Alkaline Phosphatase: 56 U/L (ref 39–117)
BUN: 20 mg/dL (ref 6–23)
CO2: 30 mEq/L (ref 19–32)
Calcium: 8.6 mg/dL (ref 8.4–10.5)
Chloride: 105 mEq/L (ref 96–112)
Creatinine, Ser: 1.02 mg/dL (ref 0.40–1.50)
GFR: 76.68 mL/min (ref >60.00)
Glucose, Bld: 125 mg/dL — ABNORMAL HIGH (ref 70–99)
Potassium: 4.4 mEq/L (ref 3.5–5.1)
Sodium: 140 mEq/L (ref 135–145)
Total Bilirubin: 0.6 mg/dL (ref 0.2–1.2)
Total Protein: 6.6 g/dL (ref 6.0–8.3)

## 2019-08-23 LAB — VITAMIN B12: Vitamin B-12: 671 pg/mL (ref 211–911)

## 2019-08-23 LAB — CBC WITH DIFFERENTIAL/PLATELET
Basophils Absolute: 0.1 10*3/uL (ref 0.0–0.1)
Basophils Relative: 0.7 % (ref 0.0–3.0)
Eosinophils Absolute: 0.2 10*3/uL (ref 0.0–0.7)
Eosinophils Relative: 1.2 % (ref 0.0–5.0)
HCT: 42.2 % (ref 39.0–52.0)
Hemoglobin: 13.7 g/dL (ref 13.0–17.0)
Lymphocytes Relative: 8.9 % — ABNORMAL LOW (ref 12.0–46.0)
Lymphs Abs: 1.4 10*3/uL (ref 0.7–4.0)
MCHC: 32.5 g/dL (ref 30.0–36.0)
MCV: 88.3 fl (ref 78.0–100.0)
Monocytes Absolute: 0.6 10*3/uL (ref 0.1–1.0)
Monocytes Relative: 4.2 % (ref 3.0–12.0)
Neutro Abs: 13 10*3/uL — ABNORMAL HIGH (ref 1.4–7.7)
Neutrophils Relative %: 85 % — ABNORMAL HIGH (ref 43.0–77.0)
Platelets: 272 10*3/uL (ref 150.0–400.0)
RBC: 4.77 Mil/uL (ref 4.22–5.81)
RDW: 14.6 % (ref 11.5–15.5)
WBC: 15.3 10*3/uL — ABNORMAL HIGH (ref 4.0–10.5)

## 2019-08-23 LAB — IBC + FERRITIN
Ferritin: 53.1 ng/mL (ref 22.0–322.0)
Iron: 59 ug/dL (ref 42–165)
Saturation Ratios: 17.4 % — ABNORMAL LOW (ref 20.0–50.0)
Transferrin: 242 mg/dL (ref 212.0–360.0)

## 2019-08-23 NOTE — Telephone Encounter (Signed)
Called and left message reminding pt regarding labs and also sent mychart message.

## 2019-08-23 NOTE — Telephone Encounter (Signed)
Orders in epic.

## 2019-08-23 NOTE — Addendum Note (Signed)
Addended by: Rosanne Sack R on: 08/23/2019 02:04 PM   Modules accepted: Orders

## 2019-08-23 NOTE — Telephone Encounter (Signed)
-----  Message from Algernon Huxley, RN sent at 08/10/2019 10:40 AM EDT ----- Regarding: Weyman Rodney level/ab Needs to pick up kit and have lab

## 2019-08-23 NOTE — Telephone Encounter (Signed)
Pts wife called and states pt is quite weak and she wanted to know if Dr. [name] would order an Iron level to be drawn today along with his entyvio level. Please advise.

## 2019-08-23 NOTE — Telephone Encounter (Signed)
Yes, please check cbc, cmp, entyvio level, ibc+ferritin, b12

## 2019-08-24 ENCOUNTER — Encounter: Payer: Self-pay | Admitting: Family Medicine

## 2019-08-24 ENCOUNTER — Other Ambulatory Visit: Payer: BC Managed Care – PPO

## 2019-08-24 ENCOUNTER — Other Ambulatory Visit: Payer: Self-pay

## 2019-08-24 DIAGNOSIS — K50811 Crohn's disease of both small and large intestine with rectal bleeding: Secondary | ICD-10-CM

## 2019-08-24 DIAGNOSIS — K5090 Crohn's disease, unspecified, without complications: Secondary | ICD-10-CM | POA: Diagnosis not present

## 2019-08-24 DIAGNOSIS — R197 Diarrhea, unspecified: Secondary | ICD-10-CM | POA: Diagnosis not present

## 2019-08-24 NOTE — Telephone Encounter (Signed)
Refill left on vm at [employer].  Notified pt via [place].

## 2019-08-24 NOTE — Telephone Encounter (Signed)
Please have patient perform a C. difficile PCR test and a fecal calprotectin The calprotectin will assess the level of his Crohn's activity given his feeling poorly, increasing bowel frequency  I do not think that the glucose of 125 should be worrisome and it is likely slightly more elevated than he is used to because he increased his prednisone dose.  Prednisone causes glucose intolerance

## 2019-08-25 DIAGNOSIS — Z7952 Long term (current) use of systemic steroids: Secondary | ICD-10-CM | POA: Diagnosis not present

## 2019-08-25 DIAGNOSIS — Z79899 Other long term (current) drug therapy: Secondary | ICD-10-CM | POA: Diagnosis not present

## 2019-08-25 DIAGNOSIS — K5090 Crohn's disease, unspecified, without complications: Secondary | ICD-10-CM | POA: Diagnosis not present

## 2019-08-25 DIAGNOSIS — I776 Arteritis, unspecified: Secondary | ICD-10-CM | POA: Diagnosis not present

## 2019-08-25 MED ORDER — PREDNISONE 1 MG PO TABS: 2.0000 mg | ORAL_TABLET | Freq: Every day | ORAL | 6 refills | Status: DC

## 2019-08-31 DIAGNOSIS — M810 Age-related osteoporosis without current pathological fracture: Secondary | ICD-10-CM | POA: Diagnosis not present

## 2019-08-31 LAB — CALPROTECTIN, FECAL: Calprotectin, Fecal: 433 ug/g — ABNORMAL HIGH (ref 0–120)

## 2019-09-02 ENCOUNTER — Telehealth: Payer: Self-pay

## 2019-09-02 NOTE — Telephone Encounter (Signed)
Pt called asking if he needed to make any changes. Discussed with him that Dr. Norman Herrlich was off and it did not look like we had drug level result back yet or Cdiff. States he was not having diarrhea any more so he did not do cdiff test. Reports he was at pred 69m daily for the next 7 days and then drops to pred 212m Please advise of any changes pt may need to make if any.

## 2019-09-03 ENCOUNTER — Encounter: Payer: Self-pay | Admitting: Family Medicine

## 2019-09-04 ENCOUNTER — Other Ambulatory Visit: Payer: Self-pay | Admitting: Student

## 2019-09-04 ENCOUNTER — Telehealth: Payer: Self-pay | Admitting: Family Medicine

## 2019-09-05 NOTE — Telephone Encounter (Signed)
Spoke with pt asking about sxs.  States he had a single episode on 09/03/19 where felt like he was about to pass out.  Has been fine since.  Scheduled OV tomorrow at 8:00.  FYI to Dr. Darnell Level.

## 2019-09-05 NOTE — Telephone Encounter (Signed)
Patient has an appointment with me coming up on 09/19/2019 I am glad that his diarrhea has resolved/improved His fecal calprotectin is elevated arguing for activity of his Crohn's disease Agree with the plans to taper prednisone back to 20 mg on his current schedule His Entyvio level was adequate and he does not have antibodies We can discuss further at his office visit but he should let me know if symptoms worsen prior to this visit Thanks

## 2019-09-06 ENCOUNTER — Other Ambulatory Visit: Payer: Self-pay

## 2019-09-06 ENCOUNTER — Encounter: Payer: Self-pay | Admitting: Family Medicine

## 2019-09-06 ENCOUNTER — Ambulatory Visit (INDEPENDENT_AMBULATORY_CARE_PROVIDER_SITE_OTHER): Payer: BC Managed Care – PPO | Admitting: Family Medicine

## 2019-09-06 VITALS — BP 118/70 | HR 67 | Temp 98.0°F | Ht 67.0 in | Wt 171.0 lb

## 2019-09-06 DIAGNOSIS — K50811 Crohn's disease of both small and large intestine with rectal bleeding: Secondary | ICD-10-CM | POA: Diagnosis not present

## 2019-09-06 DIAGNOSIS — C61 Malignant neoplasm of prostate: Secondary | ICD-10-CM | POA: Diagnosis not present

## 2019-09-06 DIAGNOSIS — R42 Dizziness and giddiness: Secondary | ICD-10-CM | POA: Insufficient documentation

## 2019-09-06 DIAGNOSIS — R350 Frequency of micturition: Secondary | ICD-10-CM

## 2019-09-06 DIAGNOSIS — N401 Enlarged prostate with lower urinary tract symptoms: Secondary | ICD-10-CM | POA: Diagnosis not present

## 2019-09-06 DIAGNOSIS — D72119 Hypereosinophilic syndrome (hes), unspecified: Secondary | ICD-10-CM

## 2019-09-06 LAB — POC URINALSYSI DIPSTICK (AUTOMATED)
Bilirubin, UA: NEGATIVE
Blood, UA: NEGATIVE
Glucose, UA: NEGATIVE
Ketones, UA: NEGATIVE
Leukocytes, UA: NEGATIVE
Nitrite, UA: NEGATIVE
Protein, UA: NEGATIVE
Spec Grav, UA: 1.015 (ref 1.010–1.025)
Urobilinogen, UA: 0.2 E.U./dL
pH, UA: 6.5 (ref 5.0–8.0)

## 2019-09-06 NOTE — Assessment & Plan Note (Signed)
Finds persistent symptoms despite flomax 0.23m daily - will change to night time administration.  Hesitant to increase dose due to dizziness noted over weekend  Discussed possible addition of finasteride to help BPH symptoms - will defer until he checks with urology first.  Check UA today.  IPSS = 19-3.

## 2019-09-06 NOTE — Assessment & Plan Note (Signed)
Orthostatics normal today, nonfocal neurological exam. Check EKG today. Unclear cause of episode of dizziness described as lightheadedness and possibly presyncope. Anticipate related to dehydration + beer during a hot day. Encouraged good hydration status. To update me if recurrent for further evaluation (labs, consider carotid US) vs return to cardiology. Pt agrees with plan.

## 2019-09-06 NOTE — Assessment & Plan Note (Signed)
Planned GI f/u later this month - continues entyvio and prednisone

## 2019-09-06 NOTE — Progress Notes (Signed)
This visit was conducted in person.  BP 118/70 (BP Location: Left Arm, Patient Position: Sitting, Cuff Size: Normal)   Pulse 67   Temp 98 F (36.7 C) (Temporal)   Ht 5' 7"  (1.702 m)   Wt 171 lb (77.6 kg)   SpO2 97%   BMI 26.78 kg/m   Orthostatic VS for the past 24 hrs (Last 3 readings):  BP- Lying BP- Standing at 0 minutes  09/06/19 0849 -- 120/78  09/06/19 0844 118/70 --    CC: dizziness Subjective:    Patient ID: Jack [name], male    DOB: [birth year], 54 y.o.   MRN: [id number]  HPI: Jack [name] is a 54 y.o. male presenting on 09/06/2019 for Dizziness (C/o dizziness, feeling faint and sweating.  Sxs occurred once on 09/03/19.  Episode lasted about 15 min.  Sat down to rest and drank some fluids. ) and Discuss Medication (Wants to discuss Flomax.)   Was at brewery over the weekend - after he drank 1 beer while sitting outside started feeling strange almost drunk feeling associated with diaphoresis, faint feeling, resolved about 30-45 min after drinking a diet coke. Has difficultly describing feeling that came over him. Possible mix of lightheadedness > presyncope. No imbalance.   No fevers/chills, abd pain, diarrhea, palpitations, chest pain or dyspnea, headache. No cough. No significant thyroid symptoms of cold/heat intolerance, unexpected weight changes or skin or hair changes.   Normally drinks beer in moderation without similar symptoms.  No new medicines, vitamins, supplements.   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  He's been doing some crossfit and otherwise tolerating well.   Fecal calprotectin recently elevated - upcoming GI appt in a few weeks. Ongoing diarrhea in known crohn's disease. Continues entyvio Q4 wks as well as prednisone 77m daily. No missed doses.   Wonders about stronger dose for flomax - takes 0.425mdaily in the morning. Notes nocturia x1-2. Known prostate cancer (12/2018 [facility])  undergoing active surveillance.   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 Recent MI s/p DES to LAD.      Relevant past medical, surgical, family and social history reviewed and updated as indicated. Interim medical history since our last visit reviewed. Allergies and medications reviewed and updated. Outpatient Medications Prior to Visit  Medication Sig Dispense Refill  . acetaminophen (TYLENOL) 325 MG tablet Take 650 mg by mouth every 6 (six) hours as needed for mild pain or headache.     . alendronate (FOSAMAX) 70 MG tablet Take 70 mg by mouth every Saturday.   11  . atorvastatin (LIPITOR) 80 MG tablet TAKE 1 TABLET (80 MG TOTAL) BY MOUTH DAILY AT 6 PM. 90 tablet 3  . Calcium Carbonate-Vitamin D3 (CALCIUM 600/VITAMIN D) 600-400 MG-UNIT TABS Take 1 tablet by mouth daily.    . carvedilol (COREG) 6.25 MG tablet TAKE 1 TABLET BY MOUTH TWICE A DAY 180 tablet 3  . clopidogrel (PLAVIX) 75 MG tablet TAKE 1 TABLET BY MOUTH EVERY DAY 90 tablet 2  . diphenoxylate-atropine (LOMOTIL) 2.5-0.025 MG tablet TAKE 1-2 TABLETS BY MOUTH 4 TIMES A DAY AS NEEDED FOR DIARRHEA 90 tablet 1  . ferrous sulfate 325 (65 FE) MG tablet Take 1 tablet (325 mg total) by mouth every other day. 1 tablet 0  . furosemide (LASIX) 20 MG tablet Take only as needed 90 tablet 3  . hydrOXYzine (ATARAX/VISTARIL) 25 MG tablet Take 3 tablets (75 mg total)  by mouth at bedtime. 30 tablet 2  . Multiple Vitamin (MULTIVITAMIN) capsule Take 1 capsule by mouth daily.    . predniSONE (DELTASONE) 1 MG tablet Take 2 tablets (2 mg total) by mouth daily with breakfast. Take with 35m dose for total 17 mg daily (Patient taking differently: Take 2 mg by mouth daily with breakfast. Take total 21 mg daily) 60 tablet 6  . predniSONE (DELTASONE) 10 MG tablet Take 15 mg by mouth daily with breakfast. Taking 14 mg; 1 mg taper per month    . sacubitril-valsartan (ENTRESTO) 24-26 MG Take 1 tablet by mouth 2 (two) times daily.  180 tablet 1  . tadalafil (CIALIS) 10 MG tablet Take 1 tablet (10 mg total) by mouth daily as needed for erectile dysfunction. 20 tablet 3  . tamsulosin (FLOMAX) 0.4 MG CAPS capsule TAKE 1 CAPSULE BY MOUTH EVERY DAY 90 capsule 3  . VASCEPA 1 g capsule TAKE 2 CAPSULES (2 G TOTAL) BY MOUTH 2 (TWO) TIMES DAILY. 360 capsule 3  . vedolizumab (ENTYVIO) 300 MG injection Inject 300 mg into the vein. Every 4 weeks    . pantoprazole (PROTONIX) 40 MG tablet Take 1 tablet (40 mg total) by mouth daily. 90 tablet 3  . nitroGLYCERIN (NITROSTAT) 0.4 MG SL tablet Place 1 tablet (0.4 mg total) under the tongue every 5 (five) minutes as needed for chest pain. 25 tablet 12   Facility-Administered Medications Prior to Visit  Medication Dose Route Frequency Provider Last Rate Last Admin  . ondansetron (ZOFRAN) 4 mg in sodium chloride 0.9 % 50 mL IVPB  4 mg Intravenous Q6H PRN EKristeen Miss MD         Per HPI unless specifically indicated in ROS section below Review of Systems Objective:  BP 118/70 (BP Location: Left Arm, Patient Position: Sitting, Cuff Size: Normal)   Pulse 67   Temp 98 F (36.7 C) (Temporal)   Ht 5' 7"  (1.702 m)   Wt 171 lb (77.6 kg)   SpO2 97%   BMI 26.78 kg/m   Wt Readings from Last 3 Encounters:  09/06/19 171 lb (77.6 kg)  03/02/19 178 lb 2 oz (80.8 kg)  02/16/19 176 lb 1.9 oz (79.9 kg)      Physical Exam Vitals and nursing note reviewed.  Constitutional:      Appearance: Normal appearance. He is not ill-appearing.  Eyes:     Extraocular Movements: Extraocular movements intact.     Conjunctiva/sclera: Conjunctivae normal.     Pupils: Pupils are equal, round, and reactive to light.  Neck:     Thyroid: No thyromegaly or thyroid tenderness.     Vascular: No carotid bruit.  Cardiovascular:     Rate and Rhythm: Normal rate and regular rhythm.     Pulses: Normal pulses.     Heart sounds: Normal heart sounds. No murmur.  Pulmonary:     Effort: Pulmonary effort is normal. No  respiratory distress.     Breath sounds: Normal breath sounds. No wheezing, rhonchi or rales.  Musculoskeletal:     Cervical back: Normal range of motion and neck supple.     Right lower leg: No edema.     Left lower leg: No edema.  Skin:    General: Skin is warm and dry.     Findings: No rash.  Neurological:     General: No focal deficit present.     Mental Status: He is alert.     Cranial Nerves: Cranial nerves are intact.  Sensory: Sensation is intact.     Motor: Motor function is intact.     Coordination: Coordination is intact. Romberg sign negative. Coordination normal. Finger-Nose-Finger Test normal.     Comments:  CN 2-12 intact FTN intact EOMI No pronator drift  Psychiatric:        Mood and Affect: Mood normal.        Behavior: Behavior normal.       Results for orders placed or performed in visit on 09/06/19  POCT Urinalysis Dipstick (Automated)  Result Value Ref Range   Color, UA yellow    Clarity, UA clear    Glucose, UA Negative Negative   Bilirubin, UA negative    Ketones, UA negative    Spec Grav, UA 1.015 1.010 - 1.025   Blood, UA negative    pH, UA 6.5 5.0 - 8.0   Protein, UA Negative Negative   Urobilinogen, UA 0.2 0.2 or 1.0 E.U./dL   Nitrite, UA negative    Leukocytes, UA Negative Negative   Lab Results  Component Value Date   TSH 0.86 07/15/2018    EKG - NSR 60, normal axis, intervals, ST/T changes anterolaterally - unchanged from prior EKG 01/2019.  Assessment & Plan:  This visit occurred during the SARS-CoV-2 public health emergency.  Safety protocols were in place, including screening questions prior to the visit, additional usage of staff PPE, and extensive cleaning of exam room while observing appropriate contact time as indicated for disinfecting solutions.   Problem List Items Addressed This Visit    Prostate cancer [facility])    H/o this followed by urology (active surveillance)      Hypereosinophilic syndrome    Continues prednisone  - up to 82m daily.  Sees rheum.       Dizziness - Primary    Orthostatics normal today, nonfocal neurological exam. Check EKG today. Unclear cause of episode of dizziness described as lightheadedness and possibly presyncope. Anticipate related to dehydration + beer during a hot day. Encouraged good hydration status. To update me if recurrent for further evaluation (labs, consider carotid [place] vs return to cardiology. Pt agrees with plan.       Relevant Orders   EKG 12-Lead (Completed)   Crohn disease ([place]    Planned GI f/u later this month - continues entyvio and prednisone       Benign prostatic hyperplasia    Finds persistent symptoms despite flomax 0.439mdaily - will change to night time administration.  Hesitant to increase dose due to dizziness noted over weekend  Discussed possible addition of finasteride to help BPH symptoms - will defer until he checks with urology first.  Check UA today.  IPSS = 19-3.       Relevant Orders   POCT Urinalysis Dipstick (Automated) (Completed)       No orders of the defined types were placed in this encounter.  Orders Placed This Encounter  Procedures  . POCT Urinalysis Dipstick (Automated)  . EKG 12-Lead    Patient Instructions  EKG today - unchanged from prior  Orthostatic vital signs today - normal.  Change flomax to night time. Would like input from urology about prostate symptoms.  Increase water intake.  Let me know if any recurrent dizziness spells.    Follow up plan: Return if symptoms worsen or fail to improve.  JaRia BushMD

## 2019-09-06 NOTE — Assessment & Plan Note (Addendum)
Continues prednisone - up to 33m daily.  Sees rheum.

## 2019-09-06 NOTE — Telephone Encounter (Signed)
E-scribed refill.  Plz schedule cpe and lab visits.

## 2019-09-06 NOTE — Telephone Encounter (Signed)
Pt notified via mychart

## 2019-09-06 NOTE — Assessment & Plan Note (Signed)
H/o this followed by urology (active surveillance)

## 2019-09-06 NOTE — Patient Instructions (Addendum)
EKG today - unchanged from prior  Orthostatic vital signs today - normal.  Change flomax to night time. Would like input from urology about prostate symptoms.  Increase water intake.  Let me know if any recurrent dizziness spells.

## 2019-09-19 ENCOUNTER — Other Ambulatory Visit (INDEPENDENT_AMBULATORY_CARE_PROVIDER_SITE_OTHER): Payer: BC Managed Care – PPO

## 2019-09-19 ENCOUNTER — Ambulatory Visit (INDEPENDENT_AMBULATORY_CARE_PROVIDER_SITE_OTHER): Payer: BC Managed Care – PPO | Admitting: Internal Medicine

## 2019-09-19 ENCOUNTER — Encounter: Payer: Self-pay | Admitting: Internal Medicine

## 2019-09-19 VITALS — BP 100/60 | HR 69 | Ht 67.0 in | Wt 170.0 lb

## 2019-09-19 DIAGNOSIS — D500 Iron deficiency anemia secondary to blood loss (chronic): Secondary | ICD-10-CM

## 2019-09-19 DIAGNOSIS — K50819 Crohn's disease of both small and large intestine with unspecified complications: Secondary | ICD-10-CM | POA: Diagnosis not present

## 2019-09-19 DIAGNOSIS — K219 Gastro-esophageal reflux disease without esophagitis: Secondary | ICD-10-CM | POA: Diagnosis not present

## 2019-09-19 DIAGNOSIS — Z86010 Personal history of colonic polyps: Secondary | ICD-10-CM | POA: Diagnosis not present

## 2019-09-19 DIAGNOSIS — Z79899 Other long term (current) drug therapy: Secondary | ICD-10-CM

## 2019-09-19 LAB — CBC WITH DIFFERENTIAL/PLATELET
Basophils Absolute: 0.1 10*3/uL (ref 0.0–0.1)
Basophils Relative: 0.7 % (ref 0.0–3.0)
Eosinophils Absolute: 0.6 10*3/uL (ref 0.0–0.7)
Eosinophils Relative: 4.6 % (ref 0.0–5.0)
HCT: 43.4 % (ref 39.0–52.0)
Hemoglobin: 14 g/dL (ref 13.0–17.0)
Lymphocytes Relative: 9.8 % — ABNORMAL LOW (ref 12.0–46.0)
Lymphs Abs: 1.3 10*3/uL (ref 0.7–4.0)
MCHC: 32.3 g/dL (ref 30.0–36.0)
MCV: 88.5 fl (ref 78.0–100.0)
Monocytes Absolute: 1.3 10*3/uL — ABNORMAL HIGH (ref 0.1–1.0)
Monocytes Relative: 9.5 % (ref 3.0–12.0)
Neutro Abs: 10.4 10*3/uL — ABNORMAL HIGH (ref 1.4–7.7)
Neutrophils Relative %: 75.4 % (ref 43.0–77.0)
Platelets: 218 10*3/uL (ref 150.0–400.0)
RBC: 4.9 Mil/uL (ref 4.22–5.81)
RDW: 14.5 % (ref 11.5–15.5)
WBC: 13.8 10*3/uL — ABNORMAL HIGH (ref 4.0–10.5)

## 2019-09-19 LAB — COMPREHENSIVE METABOLIC PANEL
ALT: 19 U/L (ref 0–53)
AST: 16 U/L (ref 0–37)
Albumin: 3.8 g/dL (ref 3.5–5.2)
Alkaline Phosphatase: 55 U/L (ref 39–117)
BUN: 18 mg/dL (ref 6–23)
CO2: 29 mEq/L (ref 19–32)
Calcium: 8.6 mg/dL (ref 8.4–10.5)
Chloride: 104 mEq/L (ref 96–112)
Creatinine, Ser: 1.16 mg/dL (ref 0.40–1.50)
GFR: 66.08 mL/min (ref >60.00)
Glucose, Bld: 111 mg/dL — ABNORMAL HIGH (ref 70–99)
Potassium: 4.8 mEq/L (ref 3.5–5.1)
Sodium: 138 mEq/L (ref 135–145)
Total Bilirubin: 0.8 mg/dL (ref 0.2–1.2)
Total Protein: 6.5 g/dL (ref 6.0–8.3)

## 2019-09-19 LAB — IBC + FERRITIN
Ferritin: 70.9 ng/mL (ref 22.0–322.0)
Iron: 74 ug/dL (ref 42–165)
Saturation Ratios: 23.3 % (ref 20.0–50.0)
Transferrin: 227 mg/dL (ref 212.0–360.0)

## 2019-09-19 MED ORDER — PREDNISONE 1 MG PO TABS: ORAL_TABLET | ORAL | 0 refills | Status: DC

## 2019-09-19 MED ORDER — AZATHIOPRINE 50 MG PO TABS: 150.0000 mg | ORAL_TABLET | Freq: Every day | ORAL | 3 refills | Status: DC

## 2019-09-19 MED ORDER — DIPHENOXYLATE-ATROPINE 2.5-0.025 MG PO TABS: ORAL_TABLET | ORAL | 2 refills | Status: DC

## 2019-09-19 MED ORDER — PREDNISONE 10 MG PO TABS: ORAL_TABLET | ORAL | 0 refills | Status: DC

## 2019-09-19 MED ORDER — PREDNISONE 5 MG PO TABS
ORAL_TABLET | ORAL | 0 refills | Status: DC
Start: 2019-09-19 — End: 2022-02-14

## 2019-09-19 NOTE — Patient Instructions (Addendum)
Your provider has requested that you go to the basement level for lab work before leaving today. Press "B" on the elevator. The lab is located at the first door on the left as you exit the elevator.   Your provider has requested that you go to the basement level for lab work on Tuesday, 10/04/19. Press "B" on the elevator. The lab is located at the first door on the left as you exit the elevator.  Please follow up with Dr [name] in 4 months in the office.  We have sent the following medications to your pharmacy for you to pick up at your convenience: Azathioprine 150 mg daily Lomotil  Continue Iron.  Continue pantoprazole.  Continue prednisone 21 mg daily. Decrease by 1 mg as often as you can!  Continue Entyvio.

## 2019-09-19 NOTE — Addendum Note (Signed)
Addended by: Larina Bras on: 09/19/2019 01:52 PM   Modules accepted: Orders

## 2019-09-19 NOTE — Progress Notes (Signed)
Subjective:    Patient ID: Jack [name], male    DOB: [birth year], 54 y.o.   MRN: [id number]  HPI Deylan Canterbury is a 54 year old male with a history of ileocolonic Crohn's disease, adenomatous colon polyps, chronic eosinophilic pneumonitis/vasculitis on chronic prednisone, history of CAD with MI and DES placed in November 2019 on Plavix, history of schwannoma with sciatic nerve compression requiring decompression May 2020 who is here for follow-up.  He was last seen at colonoscopy in September 2020 and in the office in July 2020.  He is here alone today.  He is maintained on Entyvio every 4 weeks and chronic prednisone.    His prednisone dose is very usually 1 mg at a time.  The prednisone dosing had been driven predominantly by his lung disease/pneumonitis rather than his Crohn's colitis.  However over the last several months he had a flare of his Crohn's disease with lower abdominal pain and diarrhea.  This necessitated him to increase prednisone from 18 mg daily to currently 21 mg daily.  Symptoms have come under better control.  He had an elevated fecal calprotectin at 433 in May compared to 1254 in May 2020.  He currently is having usually only a loose bowel movement in the morning as he will then take 2 Lomotil tablets and usually not have any further bowel movements throughout the day.  He is no longer having abdominal pain.  No rectal bleeding or blood in stool.  No melena.  He remains on oral iron.  He is off cholestyramine.  He is on PPI daily with pantoprazole 40 mg.  Energy levels fluctuate and he can have days when he feels very tired.  His long symptoms have been under control recently.  He continues to follow with Dr. Dossie Der with rheumatology at [facility].  This is also where he gets his Entyvio infusion.  Appetite is good.  Review of Systems As per HPI, otherwise negative  Current Medications, Allergies, Past Medical History, Past Surgical History, Family History and Social  History were reviewed in [employer] record.     Objective:   Physical Exam BP 100/60   Pulse 69   Ht 5' 7"  (1.702 m)   Wt 170 lb (77.1 kg)   BMI 26.63 kg/m  Gen: awake, alert, NAD HEENT: anicteric CV: RRR Pulm: CTA b/l Abd: soft, NT/ND, +BS throughout Ext: no c/c/e Neuro: nonfocal  CBC    Component Value Date/Time   WBC 15.3 (H) 08/23/2019 1425   RBC 4.77 08/23/2019 1425   HGB 13.7 08/23/2019 1425   HGB 14.1 11/22/2018 0958   HCT 42.2 08/23/2019 1425   HCT 44.7 11/22/2018 0958   PLT 272.0 08/23/2019 1425   PLT 302 11/22/2018 0958   MCV 88.3 08/23/2019 1425   MCV 84 11/22/2018 0958   MCH 26.6 11/22/2018 0958   MCH 24.1 (L) 08/02/2018 1048   MCHC 32.5 08/23/2019 1425   RDW 14.6 08/23/2019 1425   RDW 15.1 11/22/2018 0958   LYMPHSABS 1.4 08/23/2019 1425   MONOABS 0.6 08/23/2019 1425   EOSABS 0.2 08/23/2019 1425   BASOSABS 0.1 08/23/2019 1425   CMP     Component Value Date/Time   NA 140 08/23/2019 1425   NA 144 05/26/2019 1356   K 4.4 08/23/2019 1425   CL 105 08/23/2019 1425   CO2 30 08/23/2019 1425   GLUCOSE 125 (H) 08/23/2019 1425   BUN 20 08/23/2019 1425   BUN 20 05/26/2019 1356   CREATININE  1.02 08/23/2019 1425   CALCIUM 8.6 08/23/2019 1425   PROT 6.6 08/23/2019 1425   PROT 6.8 11/22/2018 0958   ALBUMIN 3.8 08/23/2019 1425   ALBUMIN 4.0 11/22/2018 0958   AST 22 08/23/2019 1425   ALT 22 08/23/2019 1425   ALKPHOS 56 08/23/2019 1425   BILITOT 0.6 08/23/2019 1425   BILITOT 0.7 11/22/2018 0958   GFRNONAA 75 05/26/2019 1356   GFRAA 87 05/26/2019 1356   Iron/TIBC/Ferritin/ %Sat    Component Value Date/Time   IRON 59 08/23/2019 1425   FERRITIN 53.1 08/23/2019 1425   IRONPCTSAT 17.4 (L) 08/23/2019 1425       Assessment & Plan:  sinophilic pneumonitis/vasculitis on chronic prednisone, history of CAD with MI and DES placed in November 2019 on Plavix, history of schwannoma with sciatic nerve compression requiring decompression  May 2020 who is here for follow-up.   1.  Ileocolonic Crohn's disease --he continues to have active inflammation by fecal calprotectin and also had mild activity in the right and left colon surveillance colonoscopy in September 2020.  This is despite Entyvio 300 mg every 4 weeks.  He was previously on immunomodulator therapy but this was stopped roughly 3 years ago when it did not yield the ability to decrease prednisone further.  He did not have side effects and both he and his wife recall he tolerated this medication well.  He was not on Entyvio at that time.  I have recommended given his elevated fecal calprotectin/active Crohn's disease despite Entyvio every 4 weeks and the inability to decrease prednisone that we resume immunomodulator therapy.  We discussed combination therapy with immunomodulator and Biologics today.  We discussed the risks and benefits.  Combination therapy would be expected to improve IBD better than either class of medication alone. --Continue Entyvio 300 mg every 4 weeks --Resume azathioprine 150 mg daily (approximately 2 mg/kg) --CBC, CMP today; repeat in 2 weeks after azathioprine restart --Continue prednisone 21 mg daily but try to decrease by 1 mg as often as possible to the lowest dose possible --Office visit with me in 4 months, at which time consider repeat fecal calprotectin  2.  History of adenomatous colon polyps in the setting of Crohn's disease --surveillance colonoscopy is recommended in 1 year, given his history I would not go longer than 24 months for surveillance.  We discussed this today  3.  History of iron deficiency and anemia --related to IBD.  He has been on oral iron.  Repeat iron studies today.  4.  GERD --well-controlled continue pantoprazole 40 mg daily  5.  Eosinophilic pneumonitis --following with rheumatology and previously pulmonology at [facility].  Azathioprine may also help this process

## 2019-09-21 DIAGNOSIS — K5090 Crohn's disease, unspecified, without complications: Secondary | ICD-10-CM | POA: Diagnosis not present

## 2019-09-23 NOTE — Telephone Encounter (Signed)
Called patient and left message for him to call the office and get scheduled for cpe and labs.

## 2019-09-24 ENCOUNTER — Encounter: Payer: Self-pay | Admitting: Family Medicine

## 2019-09-24 ENCOUNTER — Telehealth (INDEPENDENT_AMBULATORY_CARE_PROVIDER_SITE_OTHER): Payer: BC Managed Care – PPO | Admitting: Family Medicine

## 2019-09-24 DIAGNOSIS — R05 Cough: Secondary | ICD-10-CM

## 2019-09-24 DIAGNOSIS — R059 Cough, unspecified: Secondary | ICD-10-CM | POA: Insufficient documentation

## 2019-09-24 MED ORDER — DOXYCYCLINE HYCLATE 100 MG PO TABS: 100.0000 mg | ORAL_TABLET | Freq: Two times a day (BID) | ORAL | 0 refills | Status: DC

## 2019-09-24 NOTE — Progress Notes (Signed)
Virtual visit completed through WebEx or similar program Patient location: home  Provider location: [place] at [facility], office  Participants: Patient and me (unless stated otherwise below)  Pandemic considerations d/w pt.   Limitations and rationale for visit method d/w patient.  Patient agreed to proceed.   CC: cough.    HPI:  Recent restart of imuran with goal to gradually dec prednisone per GI clinic. He had to recently inc prednisone due to crohn's flare with some improvement in GI sx per patient report.    He had covid vaccine, d/w pt.    He was out of town last week, got back in town this past week.  Noted voice changes.  Productive cough with green sputum--x 3-4 days... denies chills, bodyaches-- SOB or difficulty breathing ...  Some sneezing--nasal mucus color clear... denies any other Sx... denies sick contacts... pt has not tried OTC  No wheeze.  No BLE edema.  Not SOB.  He doesn't feel unwell except for the cough and voice changes.    Meds and allergies reviewed.   ROS: Per HPI unless specifically indicated in ROS section   NAD Speech wnl  A/P: Cough with discolored sputum.  Discussed options esp with ongoing pred/imuran use.  Nontoxic.  Okay for outpatient fu.  Low threshold for abx tx, d/w pt.   Rest, fluids, doxy, voice rest.  Update me if not better.  routien cautions given to patient.  He agrees.

## 2019-09-24 NOTE — Assessment & Plan Note (Signed)
Cough with discolored sputum.  Discussed options esp with ongoing pred/imuran use.  Nontoxic.  Okay for outpatient fu.  Low threshold for abx tx, d/w pt.   Rest, fluids, doxy, voice rest.  Update me if not better.  routien cautions given to patient.  He agrees.

## 2019-10-05 ENCOUNTER — Encounter: Payer: Self-pay | Admitting: Family Medicine

## 2019-10-08 MED ORDER — CHERATUSSIN AC 100-10 MG/5ML PO SOLN: 5.0000 mL | Freq: Two times a day (BID) | ORAL | 0 refills | Status: DC | PRN

## 2019-10-08 NOTE — Telephone Encounter (Signed)
I asked what symptoms he's continued having.  Rx cheratussin.  Will forward to Dr Elliot Gurney.

## 2019-10-10 ENCOUNTER — Other Ambulatory Visit: Payer: Self-pay | Admitting: Family Medicine

## 2019-10-10 MED ORDER — DOXYCYCLINE HYCLATE 100 MG PO TABS: 100.0000 mg | ORAL_TABLET | Freq: Two times a day (BID) | ORAL | 0 refills | Status: DC

## 2019-10-11 ENCOUNTER — Other Ambulatory Visit: Payer: Self-pay | Admitting: Student

## 2019-10-12 ENCOUNTER — Other Ambulatory Visit (INDEPENDENT_AMBULATORY_CARE_PROVIDER_SITE_OTHER): Payer: BC Managed Care – PPO

## 2019-10-12 DIAGNOSIS — K219 Gastro-esophageal reflux disease without esophagitis: Secondary | ICD-10-CM | POA: Diagnosis not present

## 2019-10-12 DIAGNOSIS — Z86010 Personal history of colonic polyps: Secondary | ICD-10-CM | POA: Diagnosis not present

## 2019-10-12 DIAGNOSIS — D500 Iron deficiency anemia secondary to blood loss (chronic): Secondary | ICD-10-CM

## 2019-10-12 DIAGNOSIS — K50819 Crohn's disease of both small and large intestine with unspecified complications: Secondary | ICD-10-CM | POA: Diagnosis not present

## 2019-10-12 DIAGNOSIS — Z79899 Other long term (current) drug therapy: Secondary | ICD-10-CM

## 2019-10-12 LAB — COMPREHENSIVE METABOLIC PANEL
ALT: 28 U/L (ref 0–53)
AST: 22 U/L (ref 0–37)
Albumin: 4 g/dL (ref 3.5–5.2)
Alkaline Phosphatase: 55 U/L (ref 39–117)
BUN: 25 mg/dL — ABNORMAL HIGH (ref 6–23)
CO2: 31 mEq/L (ref 19–32)
Calcium: 8.6 mg/dL (ref 8.4–10.5)
Chloride: 104 mEq/L (ref 96–112)
Creatinine, Ser: 1.26 mg/dL (ref 0.40–1.50)
GFR: 60.05 mL/min (ref >60.00)
Glucose, Bld: 103 mg/dL — ABNORMAL HIGH (ref 70–99)
Potassium: 4.2 mEq/L (ref 3.5–5.1)
Sodium: 138 mEq/L (ref 135–145)
Total Bilirubin: 0.8 mg/dL (ref 0.2–1.2)
Total Protein: 6.6 g/dL (ref 6.0–8.3)

## 2019-10-12 LAB — CBC WITH DIFFERENTIAL/PLATELET
Basophils Absolute: 0.1 10*3/uL (ref 0.0–0.1)
Basophils Relative: 0.8 % (ref 0.0–3.0)
Eosinophils Absolute: 0.6 10*3/uL (ref 0.0–0.7)
Eosinophils Relative: 3.8 % (ref 0.0–5.0)
HCT: 41.2 % (ref 39.0–52.0)
Hemoglobin: 13.2 g/dL (ref 13.0–17.0)
Lymphocytes Relative: 9.9 % — ABNORMAL LOW (ref 12.0–46.0)
Lymphs Abs: 1.7 10*3/uL (ref 0.7–4.0)
MCHC: 32 g/dL (ref 30.0–36.0)
MCV: 89.7 fl (ref 78.0–100.0)
Monocytes Absolute: 0.9 10*3/uL (ref 0.1–1.0)
Monocytes Relative: 5.3 % (ref 3.0–12.0)
Neutro Abs: 13.5 10*3/uL — ABNORMAL HIGH (ref 1.4–7.7)
Neutrophils Relative %: 80.2 % — ABNORMAL HIGH (ref 43.0–77.0)
Platelets: 305 10*3/uL (ref 150.0–400.0)
RBC: 4.59 Mil/uL (ref 4.22–5.81)
RDW: 14.5 % (ref 11.5–15.5)
WBC: 16.9 10*3/uL — ABNORMAL HIGH (ref 4.0–10.5)

## 2019-10-13 ENCOUNTER — Other Ambulatory Visit: Payer: Self-pay

## 2019-10-13 DIAGNOSIS — Z79899 Other long term (current) drug therapy: Secondary | ICD-10-CM

## 2019-10-13 DIAGNOSIS — K50819 Crohn's disease of both small and large intestine with unspecified complications: Secondary | ICD-10-CM

## 2019-10-13 DIAGNOSIS — K219 Gastro-esophageal reflux disease without esophagitis: Secondary | ICD-10-CM

## 2019-10-13 DIAGNOSIS — D500 Iron deficiency anemia secondary to blood loss (chronic): Secondary | ICD-10-CM

## 2019-10-13 DIAGNOSIS — Z86010 Personal history of colonic polyps: Secondary | ICD-10-CM

## 2019-10-14 ENCOUNTER — Other Ambulatory Visit: Payer: Self-pay | Admitting: Internal Medicine

## 2019-10-20 DIAGNOSIS — C61 Malignant neoplasm of prostate: Secondary | ICD-10-CM | POA: Diagnosis not present

## 2019-10-20 DIAGNOSIS — Z79899 Other long term (current) drug therapy: Secondary | ICD-10-CM | POA: Diagnosis not present

## 2019-10-20 DIAGNOSIS — K5090 Crohn's disease, unspecified, without complications: Secondary | ICD-10-CM | POA: Diagnosis not present

## 2019-11-02 NOTE — Progress Notes (Signed)
Cardiology Office Note    Date:  11/08/2019   ID:  DEMITRIOUS MCCANNON, DOB 1965/08/28, MRN [id number]  PCP:  [name], MD  Cardiologist: Sinclair Grooms, MD EPS: None  Chief Complaint  Patient presents with  . Follow-up  . Dizziness    History of Present Illness:  Jack [name] is a 54 y.o. male with history of CAD status post STEMI 11/2017 treated with DES to the LAD with residual moderate CAD in the circumflex and RCA. Developed post infarct pericarditis, decreased LV systolic function EF 60% with LV thrombus requiring Coumadin. Developed a significant GI bleed on Coumadin and dual antiplatelet therapy. Also has essential hypertension, seizure disorder, eosinophilic granulomatosis, Crohn's disease removal of one noma of the lower spine.  Patient last saw Dr. Tamala Julian 02/16/2019 at which time he was running and doing well. No GI bleeding on Plavix. Follow-up echo 05/11/2019 LVEF 35 to 40% with moderate decreased function grade 1 DD. He was started on Entresto.  Patient comes in today for f/u. Doing crossfit with his daughter and doesn't feel like his endurance is getting any better. Feels short of breath with left hand tingling at the peak of exhaustion.Lasts a few seconds and goes away. Not like his MI. This is new for him but hasn't happened the last couple of times.No chest pain, palpitations, edema. Complains of dizziness when he sits up at times.     Past Medical History:  Diagnosis Date  . Acquired renal cyst of right kidney 08/31/2018   2.8cm R upper pole rec monitor with yearly imaging (09/2018)  . Allergy   . BPH (benign prostatic hypertrophy)   . CAD (coronary artery disease)    a. anterior STEMI 01/2018 -  proximal occlusion of LAD, treated with DES. Cath also showed 20% distal LM, 95% ostial-prox small-moderate ramus, 70-80% ostial Cx, 70% dominant ostial OM, 50-60% prox Cx, 70% RCA. EF 35% by cath with LVEDP 35mHg. Med rx for residual disease. Course  complicated by post MI pericarditis and LV thrombus.  . Chronic systolic (congestive) heart failure ([place]   . Colitis   . Colon polyp    inflammatory  . Crohn disease ([place] 1992   history uveitis, involvement of intestines and lungs  . Diverticulosis   . Eosinophilic granuloma ([place]   . Essential hypertension   . GERD (gastroesophageal reflux disease)   . GI bleeding   . History of chicken pox   . History of gastroesophageal reflux (GERD)   . History of seizure 1995   grand mal x1, completed 6 yrs dilantin. no seizures since  . Hyperlipidemia   . Internal hemorrhoids   . Ischemic cardiomyopathy   . LV (left ventricular) mural thrombus following MI ([place] 01/2018  . Myocardial infarction ([place]   . Osteoporosis 11/2015   DEXA T -2.9  . Schwannoma 2007   L axilla s/p surgery  . Seizures ([place]    last seizure 1995 and only x 1 seizure-   . Ulnar neuropathy    h/o this from L arm schwannoma    Past Surgical History:  Procedure Laterality Date  . APPENDECTOMY  2000  . BACK SURGERY  2011   lumbar  . CARDIAC CATHETERIZATION    . CARDIOVASCULAR STRESS TEST  01/2015   low risk study  . CHOLECYSTECTOMY  2000  . COLONOSCOPY  08/2014   f/u crohn's, 4 inflammatory polyps, diverticulosis, improved, rpt 2 yrs ([place])  . COLONOSCOPY  11/2018   multiple polyps,  1 TA, rpt 2 yrs ([place])  . COLONOSCOPY WITH PROPOFOL N/A 04/09/2018   inflammatory polyp, f/u left to primary GI - [place] (Danis, Kirke Corin, MD)  . CORONARY ANGIOGRAPHY N/A 02/21/2018   Procedure: CORONARY ANGIOGRAPHY;  Surgeon: Belva Crome, MD;  Location: [place] [facility];  Service: Cardiovascular;  Laterality: N/A;  . CORONARY/GRAFT ACUTE MI REVASCULARIZATION N/A 02/20/2018   Procedure: Coronary/Graft Acute MI Revascularization;  Surgeon: Belva Crome, MD;  Location: [place] [facility];  Service: Cardiovascular;  Laterality: N/A;  . ESOPHAGOGASTRODUODENOSCOPY  07/2014   h/o EE resolved, focal reflux esophagitis,  chronic active gastritis  . ESOPHAGOGASTRODUODENOSCOPY  11/2018   gastritis, no active crohn's ([place])  . extremity surgery Left    ulnar nerve repair after schwannoma removal  . FLEXIBLE SIGMOIDOSCOPY N/A 04/14/2018   Procedure: FLEXIBLE SIGMOIDOSCOPY;  Surgeon: Milus Banister, MD;  Location: [facility] ENDOSCOPY;  Service: Endoscopy;  Laterality: N/A;  . INGUINAL HERNIA REPAIR Bilateral 2017  . LAMINECTOMY N/A 08/09/2018   Procedure: Lumbar three Laminectomy, excision of intradural tumor;  Surgeon: Kristeen Miss, MD;  Location: [place];  Service: Neurosurgery;  Laterality: N/A;  . LEFT HEART CATH AND CORONARY ANGIOGRAPHY N/A 02/20/2018   Procedure: LEFT HEART CATH AND CORONARY ANGIOGRAPHY;  Surgeon: Belva Crome, MD;  Location: [place] [facility];  Service: Cardiovascular;  Laterality: N/A;  . LUMBAR SPINE SURGERY  2011  . POLYPECTOMY  04/09/2018   Procedure: POLYPECTOMY;  Surgeon: Doran Stabler, MD;  Location[facility] ENDOSCOPY;  Service: Gastroenterology;;  . SPINAL CORD STIMULATOR IMPLANT  2012   x2 (Dr Berton Mount)  . SUBMUCOSAL INJECTION  04/09/2018   Procedure: SUBMUCOSAL INJECTION;  Surgeon: Doran Stabler, MD;  Location: [facility] ENDOSCOPY;  Service: Gastroenterology;;  . TONSILLECTOMY  1996  . TUMOR REMOVAL Left 2007   schwannoma from L armpit  . UPPER GASTROINTESTINAL ENDOSCOPY      Current Medications: Current Meds  Medication Sig  . acetaminophen (TYLENOL) 325 MG tablet Take 650 mg by mouth every 6 (six) hours as needed for mild pain or headache.   . alendronate (FOSAMAX) 70 MG tablet Take 70 mg by mouth every Saturday.   [name] atorvastatin (LIPITOR) 80 MG tablet TAKE 1 TABLET (80 MG TOTAL) BY MOUTH DAILY AT 6 PM.  . azaTHIOprine (IMURAN) 50 MG tablet Take 3 tablets (150 mg total) by mouth daily. D/C any remaining script for 30 day supply please  . Calcium Carbonate-Vitamin D3 (CALCIUM 600/VITAMIN D) 600-400 MG-UNIT TABS Take 1 tablet by mouth daily.  . carvedilol (COREG) 6.25 MG tablet  TAKE 1 TABLET BY MOUTH TWICE A DAY  . clopidogrel (PLAVIX) 75 MG tablet TAKE 1 TABLET BY MOUTH EVERY DAY  . diphenoxylate-atropine (LOMOTIL) 2.5-0.025 MG tablet TAKE 1-2 TABLETS BY MOUTH 4 TIMES A DAY AS NEEDED FOR DIARRHEA  . doxycycline (VIBRA-TABS) 100 MG tablet Take 1 tablet (100 mg total) by mouth 2 (two) times daily.  . furosemide (LASIX) 20 MG tablet Take only as needed  . guaiFENesin-codeine (CHERATUSSIN AC) 100-10 MG/5ML syrup Take 5 mLs by mouth 2 (two) times daily as needed for cough (sedation precautions).  . hydrOXYzine (ATARAX/VISTARIL) 25 MG tablet Take 3 tablets (75 mg total) by mouth at bedtime.  . Iron, Ferrous Sulfate, 325 (65 Fe) MG TABS Take 1 tablet by mouth daily.  . Multiple Vitamin (MULTIVITAMIN) capsule Take 1 capsule by mouth daily.  . nitroGLYCERIN (NITROSTAT) 0.4 MG SL tablet Place 1 tablet (0.4 mg total) under  the tongue every 5 (five) minutes as needed for chest pain.  . pantoprazole (PROTONIX) 40 MG tablet TAKE 1 TABLET BY MOUTH EVERY DAY  . predniSONE (DELTASONE) 1 MG tablet Take as directed (currently on 21 mg daily- decrease by 1 mg as tolerated)  . predniSONE (DELTASONE) 10 MG tablet Take as directed (Currently taking 21 mg daily- decrease by 1 mg as tolerated)  . predniSONE (DELTASONE) 5 MG tablet Take as directed. (currently on 21 mg daily- decrease by 1 mg as tolerated)  . sacubitril-valsartan (ENTRESTO) 24-26 MG Take 1 tablet by mouth 2 (two) times daily.  . tadalafil (CIALIS) 10 MG tablet Take 1 tablet (10 mg total) by mouth daily as needed for erectile dysfunction.  . tamsulosin (FLOMAX) 0.4 MG CAPS capsule TAKE 1 CAPSULE BY MOUTH EVERY DAY  . VASCEPA 1 g capsule TAKE 2 CAPSULES (2 G TOTAL) BY MOUTH 2 (TWO) TIMES DAILY.  . vedolizumab (ENTYVIO) 300 MG injection Inject 300 mg into the vein. Every 4 weeks     Allergies:   Cephalexin and Duloxetine   Social History   Socioeconomic History  . Marital status: Married    Spouse name: Mickel Baas  . Number of  children: 2  . Years of education: 16  . Highest education level: Bachelor's degree (e.g., BA, AB, BS)  Occupational History  . Occupation: Self-employed    Comment: Patient owns 2 mattress stores  Tobacco Use  . Smoking status: Former Smoker    Types: Cigars    Quit date: 02/20/2018    Years since quitting: 1.7  . Smokeless tobacco: Never Used  Vaping Use  . Vaping Use: Never used  Substance and Sexual Activity  . Alcohol use: Yes    Comment: 2-4 beers per week s of 07/19/18  . Drug use: No  . Sexual activity: Not on file  Other Topics Concern  . Not on file  Social History Narrative   Lives with wife, 1 dog. Grown children   Edu: college   Occ: owns mattress store   Activity: active at work, started [employer], wants to restart running   Diet: good water, fruits/vegetables daily   Social Determinants of Radio broadcast assistant Strain:   . Difficulty of Paying Living Expenses:   Food Insecurity:   . Worried About Charity fundraiser in the Last Year:   . Arboriculturist in the Last Year:   Transportation Needs:   . Film/video editor (Medical):   [name] Lack of Transportation (Non-Medical):   Physical Activity:   . Days of Exercise per Week:   . Minutes of Exercise per Session:   Stress:   . Feeling of Stress :   Social Connections:   . Frequency of Communication with Friends and Family:   . Frequency of Social Gatherings with Friends and Family:   . Attends Religious Services:   . Active Member of Clubs or Organizations:   . Attends Archivist Meetings:   [name] Marital Status:      Family History:  The patient's family history includes CAD (age of onset: 35) in his father and mother; Colon cancer in his maternal grandmother; Congenital heart disease in his mother; Crohn's disease in his brother; Hyperlipidemia in his father and mother; Hypertension in his father and mother.   ROS:   Please see the history of present illness.    ROS All other systems  reviewed and are negative.   PHYSICAL EXAM:   VS:  BP 100/60   Pulse 62   Ht 5' 7"  (1.702 m)   Wt 168 lb 1.6 oz (76.2 kg)   SpO2 98%   BMI 26.33 kg/m   Physical Exam  GEN: Thin, fit, in no acute distress  Neck: no JVD, carotid bruits, or masses Cardiac:RRR; no murmurs, rubs, or gallops  Respiratory:  clear to auscultation bilaterally, normal work of breathing GI: soft, nontender, nondistended, + BS Ext: without cyanosis, clubbing, or edema, Good distal pulses bilaterally Neuro:  Alert and Oriented x 3 Psych: euthymic mood, full affect  Wt Readings from Last 3 Encounters:  11/08/19 168 lb 1.6 oz (76.2 kg)  09/19/19 170 lb (77.1 kg)  09/06/19 171 lb (77.6 kg)      Studies/Labs Reviewed:   EKG:  EKG is not ordered today.    Recent Labs: 10/12/2019: ALT 28; BUN 25; Creatinine, Ser 1.26; Hemoglobin 13.2; Platelets 305.0; Potassium 4.2; Sodium 138   Lipid Panel    Component Value Date/Time   CHOL 124 11/22/2018 0958   TRIG 144 11/22/2018 0958   HDL 32 (L) 11/22/2018 0958   CHOLHDL 3.9 11/22/2018 0958   CHOLHDL 4.5 02/20/2018 2345   VLDL 8 02/20/2018 2345   LDLCALC 63 11/22/2018 0958    Additional studies/ records that were reviewed today include:  Echo 05/11/2019 IMPRESSIONS     1. Left ventricular ejection fraction, by estimation, is 35 to 40%. The  left ventricle has moderately decreased function. The left ventrical  demonstrates regional wall motion abnormalities (see scoring  diagram/findings for description). Left ventricular   diastolic parameters are consistent with Grade I diastolic dysfunction  (impaired relaxation). Scar present in the ventricular septum from mid  ventricle to apex.   2. Definity left ventricular enhancement not performed to exclude LV  apical thrombus. No definite thrombus seen, however consider focused exam  with contrast if clinical suspicion for thrombus persists.   3. Right ventricular systolic function is normal. The right  ventricular  size is normal. Tricuspid regurgitation signal is inadequate for assessing  PA pressure.   4. The mitral valve is normal in structure and function. trivial mitral  valve regurgitation. No evidence of mitral stenosis.   5. The aortic valve is tricuspid. Aortic valve regurgitation is trivial .  No aortic stenosis is present.   6. Aortic dilatation noted. There is borderline dilatation of the aortic  root measuring 39 mm.   7. The inferior vena cava is normal in size with greater than 50%  respiratory variability, suggesting right atrial pressure of 3 mmHg.   Comparison(s): A prior study was performed on 05/27/2018. Prior images  reviewed side by side. No change in LVEF or regional wall motion  abnormalities.   FINDINGS   Left Ventricle: Left ventricular ejection fraction, by estimation, is 35  to 40%. The left ventricle has moderately decreased function. The left  ventricle demonstrates regional wall motion abnormalities. The left  ventricular internal cavity size was  normal in size. There is no left ventricular hypertrophy. Left ventricular  diastolic parameters are consistent with Grade I diastolic dysfunction  (impaired relaxation).      2D Doppler echocardiogram 05/27/2018:   IMPRESSIONS      1. The left ventricle has moderately reduced systolic function, with an ejection fraction of 35-40%. The cavity size was normal. There is mildly increased left ventricular wall thickness. Left ventricular diastolic Doppler parameters are consistent with  impaired relaxation Indeterminent filling pressures The E/e' is 8-15.  2. Severe akinesis of  the left ventricular anterior wall, anteroseptal wall and apical segment.  3. The right ventricle has normal systolic function. The cavity was normal. There is no increase in right ventricular wall thickness.  4. The mitral valve is degenerative. Mild thickening of the mitral valve leaflet. Mild calcification of the mitral valve leaflet.  Mitral valve regurgitation is mild to moderate by color flow Doppler.  5. The aortic valve is tricuspid Mild thickening of the aortic valve Mild calcification of the aortic valve. Aortic valve regurgitation is mild by color flow Doppler.  6. The aortic root and ascending aorta are normal in size and structure.  7. When compared to the prior study: No change compared to prior study in 02/2018.    ASSESSMENT:    1. Coronary artery disease involving native coronary artery of native heart without angina pectoris   2. Dyspnea on exertion   3. Ischemic cardiomyopathy   4. Essential hypertension   5. Dizziness   6. Hyperlipidemia, unspecified hyperlipidemia type   7. OSA (obstructive sleep apnea)   8. LV (left ventricular) mural thrombus following MI [facility])      PLAN:  In order of problems listed above:  CAD status post STEMI 11/2017 treated with DES to the LAD with residual nonobstructive disease. Complicated by LV thrombus, post MI pericarditis LVEF 35 to 40%. GI bleed on combination of Coumadin and dual antiplatelet therapy. Having some dyspnea and left hand tingling at peak exercise during crossfit which is new for him. Will check GXT myoview. Could be related to LVD and excessive exercise.  Ischemic cardiomyopathy ejection fraction 35 to 40% on echo 05/2019 started on Entresto- BP running low and some dizziness but not orthostatic. Will check limited echo  Essential hypertension- BP running low. May need to decrease coreg in future. Stay hydrated.  Hyperlipidemia-check FLP today  OSA  Medication Adjustments/Labs and Tests Ordered: Current medicines are reviewed at length with the patient today.  Concerns regarding medicines are outlined above.  Medication changes, Labs and Tests ordered today are listed in the Patient Instructions below. Patient Instructions  Medication Instructions:  Your physician recommends that you continue on your current medications as directed. Please refer to  the Current Medication list given to you today.  *If you need a refill on your cardiac medications before your next appointment, please call your pharmacy*   Lab Work: Lipid Panel TODAY  If you have labs (blood work) drawn today and your tests are completely normal, you will receive your results only by: [name] MyChart Message (if you have MyChart) OR . A paper copy in the mail If you have any lab test that is abnormal or we need to change your treatment, we will call you to review the results.   Testing/Procedures: Your physician has requested that you have an echocardiogram. Echocardiography is a painless test that uses sound waves to create images of your heart. It provides your doctor with information about the size and shape of your heart and how well your heart's chambers and valves are working. This procedure takes approximately one hour. There are no restrictions for this procedure.  Your physician has requested that you have en exercise stress myoview. For further information please visit [email or web address]. Please follow instruction sheet, as given.     Follow-Up: At [facility], you and your health needs are our priority.  As part of our continuing mission to provide you with exceptional heart care, we have created designated Provider Care Teams.  These Care Teams include  your primary Cardiologist (physician) and Advanced Practice Providers (APPs -  Physician Assistants and Nurse Practitioners) who all work together to provide you with the care you need, when you need it.  We recommend signing up for the patient portal called "MyChart".  Sign up information is provided on this After Visit Summary.  MyChart is used to connect with patients for Virtual Visits (Telemedicine).  Patients are able to view lab/test results, encounter notes, upcoming appointments, etc.  Non-urgent messages can be sent to your provider as well.   To learn more about what you can do with MyChart, go to  [email or web address].    Your next appointment:   01/04/2020 @ 8:20  The format for your next appointment:   In Person  Provider:   Daneen Schick, MD   Other Instructions None     Signed, Ermalinda Barrios, PA-C  11/08/2019 9:16 AM    [address], [place], [place]  [address] Phone: [phone]; Fax: [phone]

## 2019-11-08 ENCOUNTER — Ambulatory Visit (INDEPENDENT_AMBULATORY_CARE_PROVIDER_SITE_OTHER): Payer: BC Managed Care – PPO | Admitting: Physician Assistant

## 2019-11-08 ENCOUNTER — Other Ambulatory Visit: Payer: Self-pay

## 2019-11-08 ENCOUNTER — Encounter: Payer: Self-pay | Admitting: Physician Assistant

## 2019-11-08 VITALS — BP 100/60 | HR 62 | Ht 67.0 in | Wt 168.1 lb

## 2019-11-08 DIAGNOSIS — I2510 Atherosclerotic heart disease of native coronary artery without angina pectoris: Secondary | ICD-10-CM | POA: Diagnosis not present

## 2019-11-08 DIAGNOSIS — E785 Hyperlipidemia, unspecified: Secondary | ICD-10-CM

## 2019-11-08 DIAGNOSIS — R0609 Other forms of dyspnea: Secondary | ICD-10-CM

## 2019-11-08 DIAGNOSIS — I255 Ischemic cardiomyopathy: Secondary | ICD-10-CM | POA: Diagnosis not present

## 2019-11-08 DIAGNOSIS — I10 Essential (primary) hypertension: Secondary | ICD-10-CM | POA: Diagnosis not present

## 2019-11-08 DIAGNOSIS — R42 Dizziness and giddiness: Secondary | ICD-10-CM

## 2019-11-08 DIAGNOSIS — I236 Thrombosis of atrium, auricular appendage, and ventricle as current complications following acute myocardial infarction: Secondary | ICD-10-CM

## 2019-11-08 DIAGNOSIS — G4733 Obstructive sleep apnea (adult) (pediatric): Secondary | ICD-10-CM

## 2019-11-08 DIAGNOSIS — R0600 Dyspnea, unspecified: Secondary | ICD-10-CM | POA: Diagnosis not present

## 2019-11-08 LAB — LIPID PANEL
Chol/HDL Ratio: 4.6 ratio (ref 0.0–5.0)
Cholesterol, Total: 138 mg/dL (ref 100–199)
HDL: 30 mg/dL — ABNORMAL LOW (ref >39)
LDL Chol Calc (NIH): 86 mg/dL (ref 0–99)
Triglycerides: 119 mg/dL (ref 0–149)
VLDL Cholesterol Cal: 22 mg/dL (ref 5–40)

## 2019-11-08 NOTE — Patient Instructions (Signed)
Medication Instructions:  Your physician recommends that you continue on your current medications as directed. Please refer to the Current Medication list given to you today.  *If you need a refill on your cardiac medications before your next appointment, please call your pharmacy*   Lab Work: Lipid Panel TODAY  If you have labs (blood work) drawn today and your tests are completely normal, you will receive your results only by: [name] MyChart Message (if you have MyChart) OR . A paper copy in the mail If you have any lab test that is abnormal or we need to change your treatment, we will call you to review the results.   Testing/Procedures: Your physician has requested that you have an echocardiogram. Echocardiography is a painless test that uses sound waves to create images of your heart. It provides your doctor with information about the size and shape of your heart and how well your heart's chambers and valves are working. This procedure takes approximately one hour. There are no restrictions for this procedure.  Your physician has requested that you have en exercise stress myoview. For further information please visit [email or web address]. Please follow instruction sheet, as given.     Follow-Up: At [facility], you and your health needs are our priority.  As part of our continuing mission to provide you with exceptional heart care, we have created designated Provider Care Teams.  These Care Teams include your primary Cardiologist (physician) and Advanced Practice Providers (APPs -  Physician Assistants and Nurse Practitioners) who all work together to provide you with the care you need, when you need it.  We recommend signing up for the patient portal called "MyChart".  Sign up information is provided on this After Visit Summary.  MyChart is used to connect with patients for Virtual Visits (Telemedicine).  Patients are able to view lab/test results, encounter notes, upcoming appointments,  etc.  Non-urgent messages can be sent to your provider as well.   To learn more about what you can do with MyChart, go to [email or web address].    Your next appointment:   01/04/2020 @ 8:20  The format for your next appointment:   In Person  Provider:   Daneen Schick, MD   Other Instructions None

## 2019-11-10 ENCOUNTER — Telehealth: Payer: Self-pay

## 2019-11-10 NOTE — Telephone Encounter (Signed)
-----   Message from Yevette Edwards, RN sent at 10/13/2019  9:44 AM EDT ----- Regarding: Labs Repeat CBC, CMP, order in epic

## 2019-11-10 NOTE — Telephone Encounter (Signed)
Left detailed message for patient letting him know that he is due for repeat labs, he can go by the lab at his convenience between 7:30-5, Monday through Friday, no appt necessary. Advised patient to call the office if he had any questions.

## 2019-11-11 ENCOUNTER — Other Ambulatory Visit: Payer: Self-pay

## 2019-11-11 ENCOUNTER — Telehealth: Payer: Self-pay

## 2019-11-11 ENCOUNTER — Telehealth: Payer: Self-pay | Admitting: Physician Assistant

## 2019-11-11 MED ORDER — EZETIMIBE 10 MG PO TABS
10.0000 mg | ORAL_TABLET | Freq: Every day | ORAL | 3 refills | Status: DC
Start: 2019-11-11 — End: 2020-11-12

## 2019-11-11 NOTE — Telephone Encounter (Signed)
     Pt's wife returning April's call

## 2019-11-11 NOTE — Telephone Encounter (Signed)
Called pt back, no answer. LM on VM with new medication instructions and advised pt to call back to schedule lab appt in 3 mths

## 2019-11-11 NOTE — Telephone Encounter (Signed)
Pt called back and lab appt was made for 11/17

## 2019-11-14 ENCOUNTER — Telehealth: Payer: Self-pay

## 2019-11-14 NOTE — Telephone Encounter (Signed)
**Note De-Identified  Obfuscation** I started a Ezetimibe A through covermymeds: Key: [id number]

## 2019-11-14 NOTE — Telephone Encounter (Signed)
**Note De-Identified  Obfuscation** Following message was received from Covermymeds:  Elroy Channel Key: [place] Constable - PA Case ID: [id number] - Rx #: [id number] Outcome: Approved today  Coverage Starts on: 11/14/2019 12:00:00 AM, Coverage Ends on: 11/13/2020 12:00:00 AM.  Drug Ezetimibe 10MG tablets  Form Librarian, academic PA Form (2017 NCPDP)  Original Claim [id number] PLAN PREFERS GENERICDRUG REQUIRES PRIOR AUTHORIZATION  I have notified [facility] of this approval.

## 2019-11-17 DIAGNOSIS — K5090 Crohn's disease, unspecified, without complications: Secondary | ICD-10-CM | POA: Diagnosis not present

## 2019-11-22 ENCOUNTER — Other Ambulatory Visit (HOSPITAL_COMMUNITY)
Admission: RE | Admit: 2019-11-22 | Discharge: 2019-11-22 | Disposition: A | Discharge status: Home / Self Care | Payer: BC Managed Care – PPO | Source: Ambulatory Visit | Attending: Physician Assistant | Admitting: Physician Assistant

## 2019-11-22 ENCOUNTER — Telehealth (HOSPITAL_COMMUNITY): Payer: Self-pay | Admitting: *Deleted

## 2019-11-22 DIAGNOSIS — Z20822 Contact with and (suspected) exposure to covid-19: Secondary | ICD-10-CM | POA: Diagnosis not present

## 2019-11-22 DIAGNOSIS — Z01812 Encounter for preprocedural laboratory examination: Secondary | ICD-10-CM | POA: Insufficient documentation

## 2019-11-22 LAB — SARS CORONAVIRUS 2 (TAT 6-24 HRS): SARS Coronavirus 2: NEGATIVE

## 2019-11-22 NOTE — Telephone Encounter (Signed)
Patient given detailed instructions per Myocardial Perfusion Study Information Sheet for the test on 11/25/19 at 7:15. Patient notified to arrive 15 minutes early and that it is imperative to arrive on time for appointment to keep from having the test rescheduled.  If you need to cancel or reschedule your appointment, please call the office within 24 hours of your appointment. . Patient verbalized understanding.Jack [name]

## 2019-11-24 DIAGNOSIS — C61 Malignant neoplasm of prostate: Secondary | ICD-10-CM | POA: Diagnosis not present

## 2019-11-24 DIAGNOSIS — R3914 Feeling of incomplete bladder emptying: Secondary | ICD-10-CM | POA: Diagnosis not present

## 2019-11-24 DIAGNOSIS — R8279 Other abnormal findings on microbiological examination of urine: Secondary | ICD-10-CM | POA: Diagnosis not present

## 2019-11-24 DIAGNOSIS — N401 Enlarged prostate with lower urinary tract symptoms: Secondary | ICD-10-CM | POA: Diagnosis not present

## 2019-11-24 DIAGNOSIS — R351 Nocturia: Secondary | ICD-10-CM | POA: Diagnosis not present

## 2019-11-25 ENCOUNTER — Telehealth: Payer: Self-pay

## 2019-11-25 ENCOUNTER — Other Ambulatory Visit: Payer: Self-pay

## 2019-11-25 ENCOUNTER — Ambulatory Visit (HOSPITAL_BASED_OUTPATIENT_CLINIC_OR_DEPARTMENT_OTHER): Payer: BC Managed Care – PPO

## 2019-11-25 ENCOUNTER — Ambulatory Visit (HOSPITAL_COMMUNITY): Payer: BC Managed Care – PPO | Attending: Cardiovascular Disease

## 2019-11-25 VITALS — Ht 67.0 in | Wt 168.0 lb

## 2019-11-25 DIAGNOSIS — I255 Ischemic cardiomyopathy: Secondary | ICD-10-CM | POA: Insufficient documentation

## 2019-11-25 DIAGNOSIS — R0600 Dyspnea, unspecified: Secondary | ICD-10-CM

## 2019-11-25 DIAGNOSIS — I236 Thrombosis of atrium, auricular appendage, and ventricle as current complications following acute myocardial infarction: Secondary | ICD-10-CM | POA: Diagnosis not present

## 2019-11-25 DIAGNOSIS — I2510 Atherosclerotic heart disease of native coronary artery without angina pectoris: Secondary | ICD-10-CM | POA: Diagnosis not present

## 2019-11-25 DIAGNOSIS — R9430 Abnormal result of cardiovascular function study, unspecified: Secondary | ICD-10-CM

## 2019-11-25 DIAGNOSIS — R0609 Other forms of dyspnea: Secondary | ICD-10-CM

## 2019-11-25 LAB — MYOCARDIAL PERFUSION IMAGING
LV dias vol: 154 mL (ref 62–150)
LV sys vol: 96 mL
Peak HR: 134 {beats}/min
Rest HR: 61 {beats}/min
SDS: 3
SRS: 23
SSS: 27
TID: 0.94

## 2019-11-25 LAB — ECHOCARDIOGRAM LIMITED
Area-P 1/2: 3.12 cm2
Height: 67 in
S' Lateral: 4.9 cm
Weight: 2688 oz

## 2019-11-25 MED ORDER — REGADENOSON 0.4 MG/5ML IV SOLN
0.4000 mg | Freq: Once | INTRAVENOUS | Status: AC
  Administered 2019-11-25: 0.4 mg via INTRAVENOUS

## 2019-11-25 MED ORDER — TECHNETIUM TC 99M TETROFOSMIN IV KIT
10.3000 | PACK | Freq: Once | INTRAVENOUS | Status: AC | PRN
  Administered 2019-11-25: 10.3 via INTRAVENOUS
  Filled 2019-11-25: qty 11

## 2019-11-25 MED ORDER — TECHNETIUM TC 99M TETROFOSMIN IV KIT
32.4000 | PACK | Freq: Once | INTRAVENOUS | Status: AC | PRN
  Administered 2019-11-25: 32.4 via INTRAVENOUS
  Filled 2019-11-25: qty 33

## 2019-11-25 MED ORDER — PERFLUTREN LIPID MICROSPHERE
1.0000 mL | INTRAVENOUS | Status: AC | PRN
  Administered 2019-11-25: 3 mL via INTRAVENOUS

## 2019-11-25 NOTE — Telephone Encounter (Signed)
-----   Message from Imogene Burn, [place] sent at 11/25/2019  1:14 PM EDT ----- Heart function still about 35-40% similar to last echo. Please make referal to advanced CHF clinic. Let him know there are some newer medications that may be used to help him. His BP is too low to titrate current meds. Thanks

## 2019-11-25 NOTE — Telephone Encounter (Signed)
Patient aware of results. Will place order for referral. Will send message to HF clinic to help schedule appointment.

## 2019-11-28 DIAGNOSIS — Z79899 Other long term (current) drug therapy: Secondary | ICD-10-CM | POA: Diagnosis not present

## 2019-11-28 DIAGNOSIS — Z7952 Long term (current) use of systemic steroids: Secondary | ICD-10-CM | POA: Diagnosis not present

## 2019-11-28 DIAGNOSIS — I776 Arteritis, unspecified: Secondary | ICD-10-CM | POA: Diagnosis not present

## 2019-11-28 DIAGNOSIS — K5090 Crohn's disease, unspecified, without complications: Secondary | ICD-10-CM | POA: Diagnosis not present

## 2019-12-02 ENCOUNTER — Telehealth: Payer: Self-pay | Admitting: Family Medicine

## 2019-12-02 NOTE — Telephone Encounter (Signed)
E-scribed refill.  Plz schedule cpe and lab visits.

## 2019-12-04 IMAGING — RF DG MYELOGRAM LUMBAR
14 of 16 series · 14 of 16 positions shown · non-contrast
Comparison: 01/01/2018.

CLINICAL DATA: Back and leg pain. Previous surgery for resection of
schwannoma.

[Series 1: cp_standard · 0.29mm/px · 1 of 1 slices shown (1 of 2)]
[im 1/1]
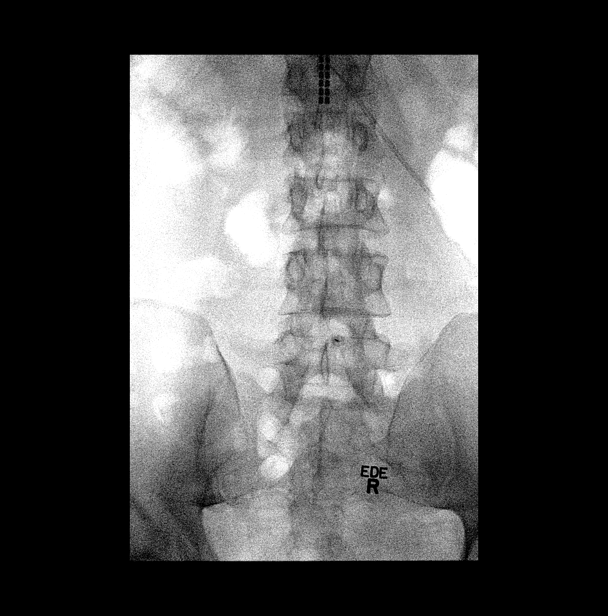

[Series 2: cp_standard · 0.26mm/px · 1 of 1 slices shown (2 of 2)]
[im 1/1]
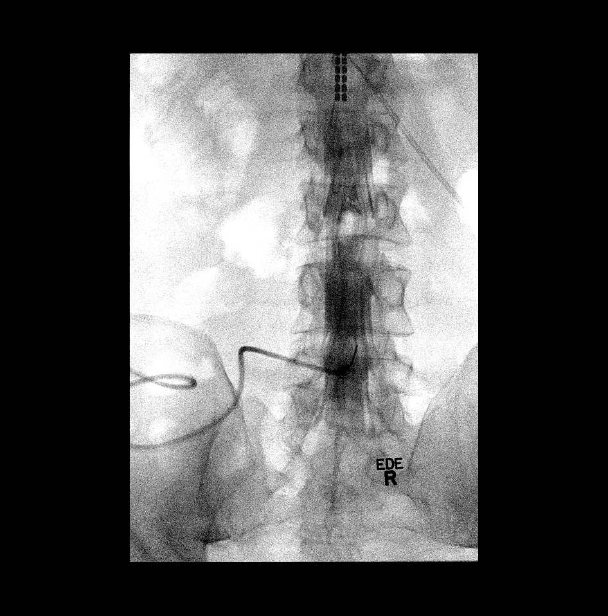

[Series 3: fluoro_myelogram_singleshot_bw · 0.20mm/px · 1 of 1 slices shown (1 of 12)]
[im 1/1]
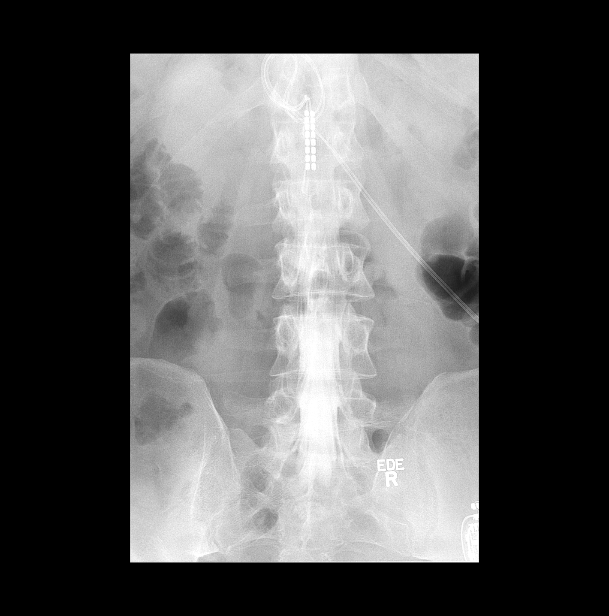

[Series 5: fluoro_myelogram_singleshot_bw · 0.20mm/px · 1 of 1 slices shown (2 of 12)]
[im 1/1]
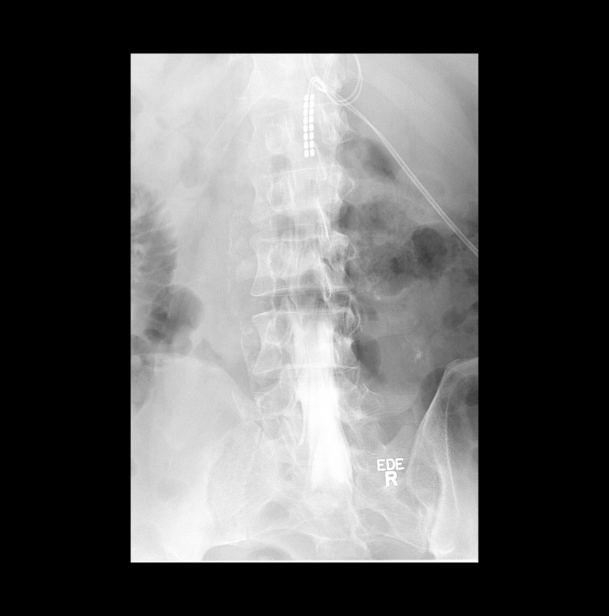

[Series 6: fluoro_myelogram_singleshot_bw · 0.17mm/px · 1 of 1 slices shown (3 of 12)]
[im 1/1]
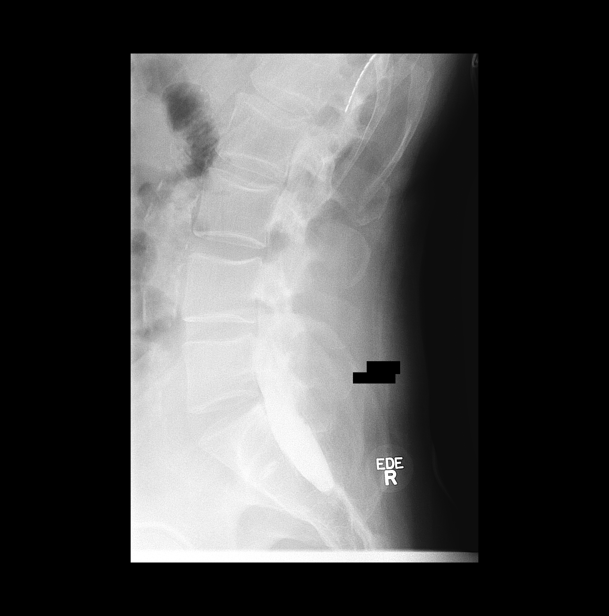

[Series 7: fluoro_myelogram_singleshot_bw · 0.17mm/px · 1 of 1 slices shown (4 of 12)]
[im 1/1]
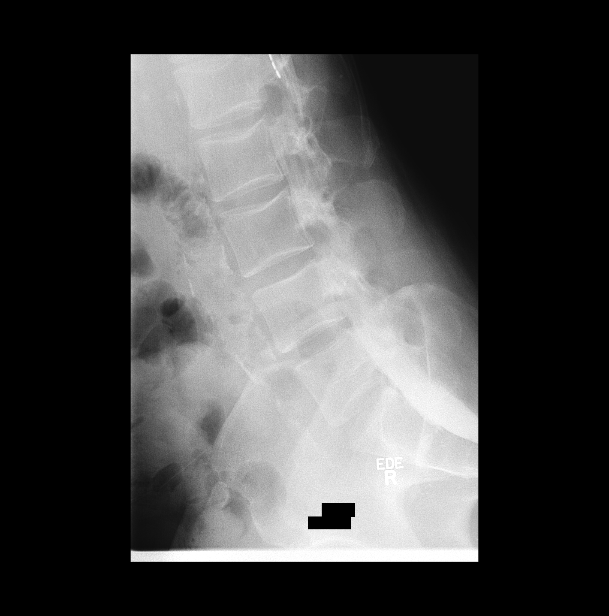

[Series 8: fluoro_myelogram_singleshot_bw · 0.17mm/px · 1 of 1 slices shown (5 of 12)]
[im 1/1]
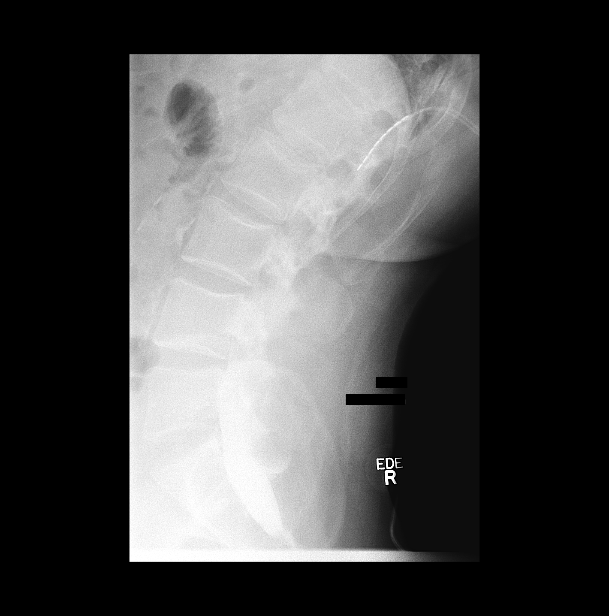

[Series 9: fluoro_myelogram_singleshot_bw · 0.17mm/px · 1 of 1 slices shown (6 of 12)]
[im 1/1]
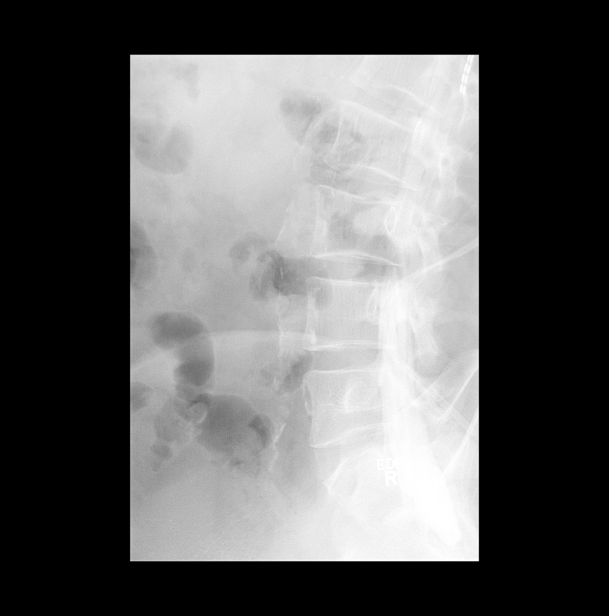

[Series 10: fluoro_myelogram_singleshot_bw · 0.17mm/px · 1 of 1 slices shown (7 of 12)]
[im 1/1]
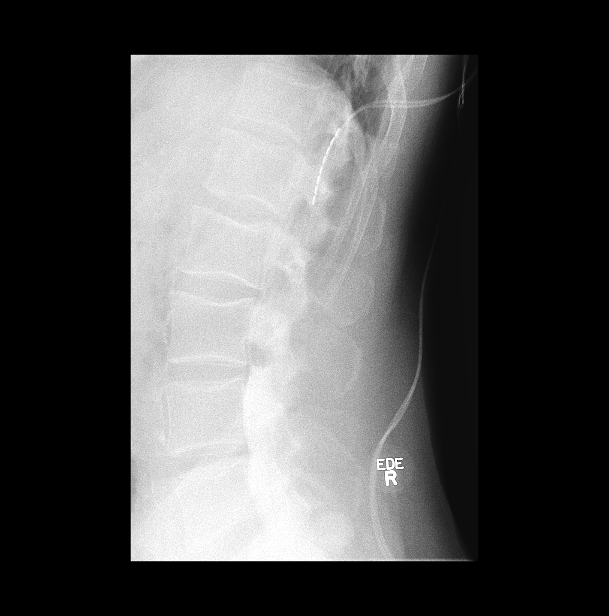

[Series 11: fluoro_myelogram_singleshot_bw · 0.18mm/px · 1 of 1 slices shown (8 of 12)]
[im 1/1]
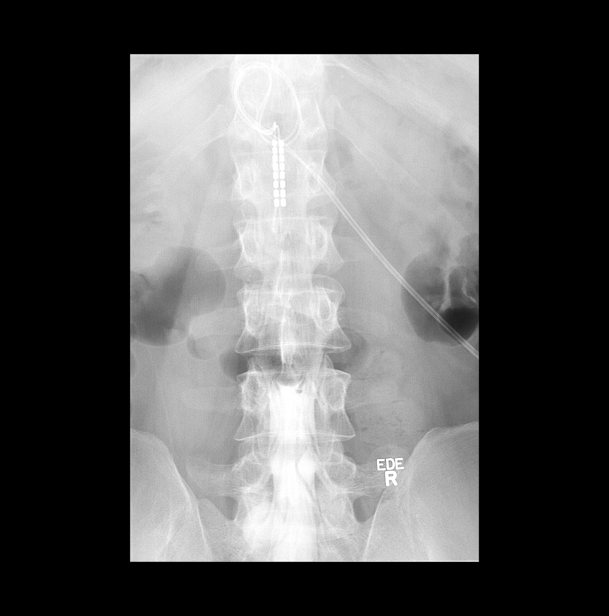

[Series 13: fluoro_myelogram_singleshot_bw · 0.18mm/px · 1 of 1 slices shown (9 of 12)]
[im 1/1]
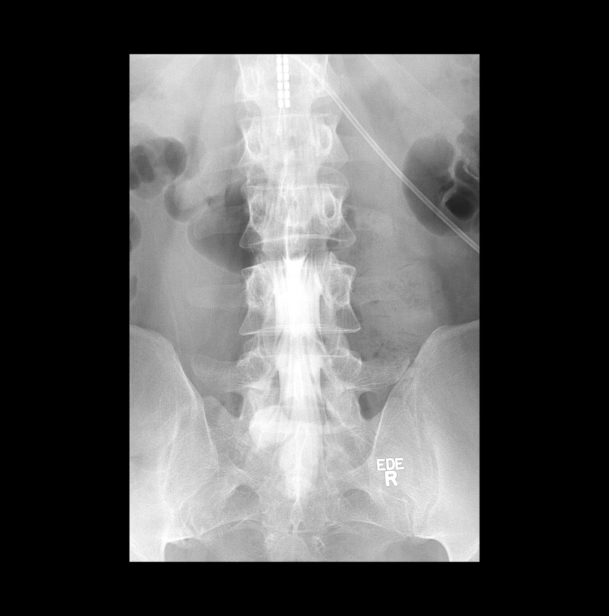

[Series 14: fluoro_myelogram_singleshot_bw · 0.18mm/px · 1 of 1 slices shown (10 of 12)]
[im 1/1]
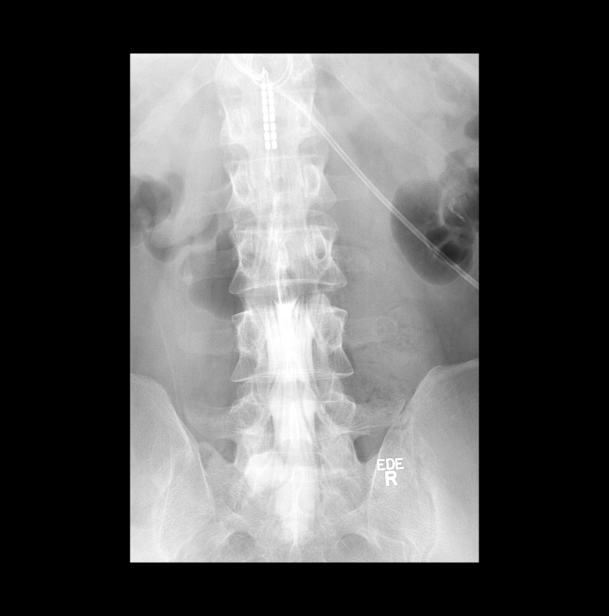

[Series 15: fluoro_myelogram_singleshot_bw · 0.18mm/px · 1 of 1 slices shown (11 of 12)]
[im 1/1]
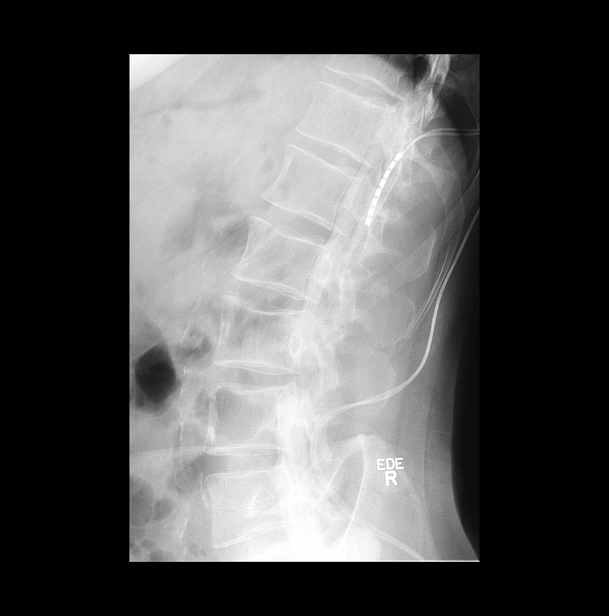

[Series 16: fluoro_myelogram_singleshot_bw · 0.18mm/px · 1 of 1 slices shown (12 of 12)]
[im 1/1]
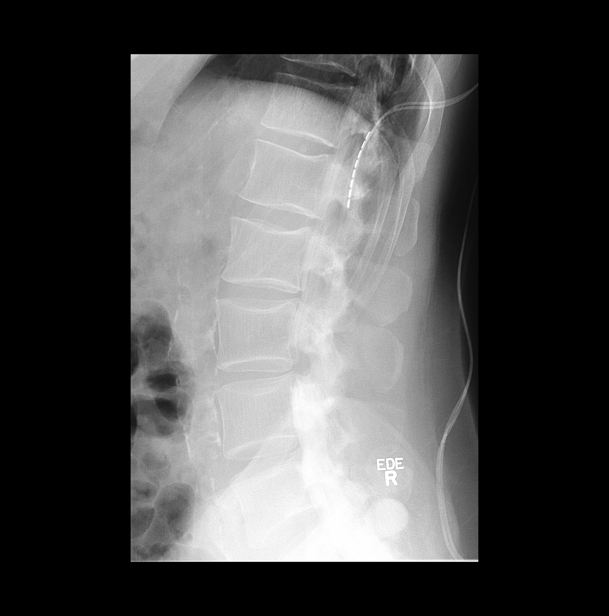

[14 of 16 positions shown; findings below may reference images not displayed]

09/10/2016.

FLUOROSCOPY TIME:  1 minutes 0 seconds. 1209.73 micro gray meter
squared

PROCEDURE:
LUMBAR MYELOGRAM

Lumbar puncture and contrast injection were performed by Dr. Samsonaite.

I personally performed the  acquisition of the myelogram images.

CT MYELOGRAM LUMBAR

Procedure: CT imaging of the lumbar spine was performed after
intrathecal contrast administration. Multiplanar CT image
reconstructions were also generated.
FINDINGS: LUMBAR MYELOGRAM FINDINGS:

There is an intradural mass at the level inferior L3 with a relative
block to the passage of contrast.

No evidence of degenerative disc disease or degenerative facet
disease.

Pseudomeningocele projecting posteriorly on the left at L5-S1.

Enlargement of the left S1 nerve in the root sleeve with poor root
sleeve filling.

No abnormal motion with flexion extension.

CT LUMBAR MYELOGRAM FINDINGS:

Dorsal neurostimulator placed through the interlaminar space of
T11-12 with electrodes from mid T12 to mid L1. No disc, canal or
foraminal pathology at L2-3 or above.

At L3-4, there is an intradural mass measuring 14 mm in diameter,
round, and consistent with neurofibroma or schwannoma.

There is a 1 or 2 mm nodule along a nerve root in the central canal
at the level of the inferior endplate of L4 consistent with an
additional nerve root tumor.

Patient has had previous surgery on the left at L5-S1 related to a
nerve root tumor. Chronic pseudomeningocele at the level of the
surgical approach. Residual/recurrent tumor filling the left S1 root
sleeve extending over a length of about 12 mm.

There is no degenerative disc disease and no significant
degenerative facet disease.
IMPRESSION: 14 mm round intradural mass lesion at inferior L3 with relative
block to the passage of contrast. This is consistent with a nerve
root tumor.

1 or 2 mm nerve root nodule at the level of inferior L4 consistent
with a small nerve root tumor.

Left S1 nerve root tumor filling the root sleeve. Dorsal
pseudomeningocele at that level related to previous surgical
procedure.

## 2019-12-07 ENCOUNTER — Telehealth (HOSPITAL_COMMUNITY): Payer: Self-pay | Admitting: Vascular Surgery

## 2019-12-07 NOTE — Telephone Encounter (Signed)
Left pt message giving new chf appt w/ db 10/25 @ 2:20pm, asked pt to call back to confirm appt

## 2019-12-13 NOTE — Telephone Encounter (Signed)
Lm asking pt to call office

## 2019-12-15 DIAGNOSIS — K5090 Crohn's disease, unspecified, without complications: Secondary | ICD-10-CM | POA: Diagnosis not present

## 2019-12-20 ENCOUNTER — Encounter: Payer: Self-pay | Admitting: Family Medicine

## 2019-12-20 DIAGNOSIS — I5020 Unspecified systolic (congestive) heart failure: Secondary | ICD-10-CM | POA: Insufficient documentation

## 2019-12-29 ENCOUNTER — Encounter: Payer: Self-pay | Admitting: Family Medicine

## 2019-12-29 NOTE — Telephone Encounter (Signed)
Mailed letter °

## 2019-12-29 NOTE — Telephone Encounter (Signed)
Called and left patient a message to call and schedule CPE and labs prior to medication refill

## 2019-12-29 NOTE — Telephone Encounter (Signed)
Noted  

## 2020-01-03 NOTE — Progress Notes (Signed)
Cardiology Office Note:    Date:  01/04/2020   ID:  Jack [name], DOB 1966/02/07, MRN [id number]  PCP:  [name], MD  Cardiologist:  Sinclair Grooms, MD   Referring MD: [name], MD   Chief Complaint  Patient presents with   Atrial Fibrillation   Coronary Artery Disease   Congestive Heart Failure    History of Present Illness:    Jack [name] is a 54 y.o. male with a hx of CAD status post STEMI 11/2017 treated with DES to the LAD with residual moderate CAD in the circumflex and RCA. Developed post infarct pericarditis, decreased LV systolic function EF 39% with LV thrombus requiring Coumadin. Developed a significant GI bleed on Coumadin and dual antiplatelet therapy. Also has essential hypertension, seizure disorder, eosinophilic granulomatosis, Crohn's disease removal of intra-dural meningioma of the lower spine.  He is not having angina.  He is physically active.  Trying to build his endurance.  He has been running.  He is doing cross training.  He sometimes has tingling in his hands and dizziness after working out.  He denies ankle swelling, orthopnea, PND, and palpitations.  As to know if it is okay to smoke a cigar once a week.  Past Medical History:  Diagnosis Date   Acquired renal cyst of right kidney 08/31/2018   2.8cm R upper pole rec monitor with yearly imaging (09/2018)   Allergy    BPH (benign prostatic hypertrophy)    CAD (coronary artery disease)    a. anterior STEMI 01/2018 -  proximal occlusion of LAD, treated with DES. Cath also showed 20% distal LM, 95% ostial-prox small-moderate ramus, 70-80% ostial Cx, 70% dominant ostial OM, 50-60% prox Cx, 70% RCA. EF 35% by cath with LVEDP 41mHg. Med rx for residual disease. Course complicated by post MI pericarditis and LV thrombus.   Chronic systolic (congestive) heart failure (HCC)    Colitis    Colon polyp    inflammatory   Crohn disease ([facility] 1992   history uveitis, involvement  of intestines and lungs   Diverticulosis    Eosinophilic granuloma ([facility]    Essential hypertension    GERD (gastroesophageal reflux disease)    GI bleeding    History of chicken pox    History of gastroesophageal reflux (GERD)    History of seizure 1995   grand mal x1, completed 6 yrs dilantin. no seizures since   Hyperlipidemia    Internal hemorrhoids    Ischemic cardiomyopathy    LV (left ventricular) mural thrombus following MI ([facility] 01/2018   Myocardial infarction ([facility]    Osteoporosis 11/2015   DEXA T -2.9   Schwannoma 2007   L axilla s/p surgery   Seizures ([facility]    last seizure 1995 and only x 1 seizure-    Ulnar neuropathy    h/o this from L arm schwannoma    Past Surgical History:  Procedure Laterality Date   APPENDECTOMY  2000   BACK SURGERY  2011   lumbar   CARDIAC CATHETERIZATION     CARDIOVASCULAR STRESS TEST  01/2015   low risk study   CHOLECYSTECTOMY  2000   COLONOSCOPY  08/2014   f/u crohn's, 4 inflammatory polyps, diverticulosis, improved, rpt 2 yrs ([facility]   COLONOSCOPY  11/2018   multiple polyps, 1 TA, rpt 2 yrs ([facility])   COLONOSCOPY WITH PROPOFOL N/A 04/09/2018   inflammatory polyp, f/u left to primary GI - [facility] ([name] HKirke Corin MD)   CORONARY  ANGIOGRAPHY N/A 02/21/2018   Procedure: CORONARY ANGIOGRAPHY;  Surgeon: Belva Crome, MD;  Location: [place] [facility];  Service: Cardiovascular;  Laterality: N/A;   CORONARY/GRAFT ACUTE MI REVASCULARIZATION N/A 02/20/2018   Procedure: Coronary/Graft Acute MI Revascularization;  Surgeon: Belva Crome, MD;  Location: [place] [facility];  Service: Cardiovascular;  Laterality: N/A;   ESOPHAGOGASTRODUODENOSCOPY  07/2014   h/o EE resolved, focal reflux esophagitis, chronic active gastritis   ESOPHAGOGASTRODUODENOSCOPY  11/2018   gastritis, no active crohn's ([facility])   extremity surgery Left    ulnar nerve repair after schwannoma removal   FLEXIBLE SIGMOIDOSCOPY N/A  04/14/2018   Procedure: FLEXIBLE SIGMOIDOSCOPY;  Surgeon: Milus Banister, MD;  Location: [facility] ENDOSCOPY;  Service: Endoscopy;  Laterality: N/A;   INGUINAL HERNIA REPAIR Bilateral 2017   LAMINECTOMY N/A 08/09/2018   Procedure: Lumbar three Laminectomy, excision of intradural tumor;  Surgeon: Kristeen Miss, MD;  Location: [place];  Service: Neurosurgery;  Laterality: N/A;   LEFT HEART CATH AND CORONARY ANGIOGRAPHY N/A 02/20/2018   Procedure: LEFT HEART CATH AND CORONARY ANGIOGRAPHY;  Surgeon: Belva Crome, MD;  Location: [place] [facility];  Service: Cardiovascular;  Laterality: N/A;   LUMBAR SPINE SURGERY  2011   POLYPECTOMY  04/09/2018   Procedure: POLYPECTOMY;  Surgeon: Doran Stabler, MD;  Location: [facility] ENDOSCOPY;  Service: Gastroenterology;;   SPINAL CORD STIMULATOR IMPLANT  2012   x2 (Dr Berton Mount)   [place]  04/09/2018   Procedure: SUBMUCOSAL INJECTION;  Surgeon: Doran Stabler, MD;  Location: [place] ENDOSCOPY;  Service: Gastroenterology;;   TONSILLECTOMY  1996   TUMOR REMOVAL Left 2007   schwannoma from L armpit   UPPER GASTROINTESTINAL ENDOSCOPY      Current Medications: Current Meds  Medication Sig   acetaminophen (TYLENOL) 325 MG tablet Take 650 mg by mouth every 6 (six) hours as needed for mild pain or headache.    alendronate (FOSAMAX) 70 MG tablet Take 70 mg by mouth every Saturday.    atorvastatin (LIPITOR) 80 MG tablet TAKE 1 TABLET (80 MG TOTAL) BY MOUTH DAILY AT 6 PM.   azaTHIOprine (IMURAN) 50 MG tablet Take 3 tablets (150 mg total) by mouth daily. D/C any remaining script for 30 day supply please   Calcium Carbonate-Vitamin D3 (CALCIUM 600/VITAMIN D) 600-400 MG-UNIT TABS Take 1 tablet by mouth daily.   carvedilol (COREG) 6.25 MG tablet TAKE 1 TABLET BY MOUTH TWICE A DAY   clopidogrel (PLAVIX) 75 MG tablet TAKE 1 TABLET BY MOUTH EVERY DAY   diphenoxylate-atropine (LOMOTIL) 2.5-0.025 MG tablet TAKE 1-2 TABLETS BY MOUTH 4 TIMES A DAY AS NEEDED  FOR DIARRHEA   doxycycline (VIBRA-TABS) 100 MG tablet Take 1 tablet (100 mg total) by mouth 2 (two) times daily.   ezetimibe (ZETIA) 10 MG tablet Take 1 tablet (10 mg total) by mouth daily.   guaiFENesin-codeine (CHERATUSSIN AC) 100-10 MG/5ML syrup Take 5 mLs by mouth 2 (two) times daily as needed for cough (sedation precautions).   hydrOXYzine (ATARAX/VISTARIL) 25 MG tablet Take 3 tablets (75 mg total) by mouth at bedtime.   Iron, Ferrous Sulfate, 325 (65 Fe) MG TABS Take 1 tablet by mouth daily.   Multiple Vitamin (MULTIVITAMIN) capsule Take 1 capsule by mouth daily.   nitroGLYCERIN (NITROSTAT) 0.4 MG SL tablet Place 1 tablet (0.4 mg total) under the tongue every 5 (five) minutes as needed for chest pain.   pantoprazole (PROTONIX) 40 MG tablet TAKE 1 TABLET BY MOUTH EVERY DAY  predniSONE (DELTASONE) 1 MG tablet Take as directed (currently on 21 mg daily- decrease by 1 mg as tolerated)   predniSONE (DELTASONE) 10 MG tablet Take as directed (Currently taking 21 mg daily- decrease by 1 mg as tolerated)   predniSONE (DELTASONE) 5 MG tablet Take as directed. (currently on 21 mg daily- decrease by 1 mg as tolerated)   sacubitril-valsartan (ENTRESTO) 24-26 MG Take 1 tablet by mouth 2 (two) times daily.   tadalafil (CIALIS) 10 MG tablet Take 1 tablet (10 mg total) by mouth daily as needed for erectile dysfunction.   tamsulosin (FLOMAX) 0.4 MG CAPS capsule TAKE 1 CAPSULE BY MOUTH EVERY DAY   VASCEPA 1 g capsule TAKE 2 CAPSULES (2 G TOTAL) BY MOUTH 2 (TWO) TIMES DAILY.   vedolizumab (ENTYVIO) 300 MG injection Inject 300 mg into the vein. Every 4 weeks   [DISCONTINUED] furosemide (LASIX) 20 MG tablet Take only as needed     Allergies:   Cephalexin and Duloxetine   Social History   Socioeconomic History   Marital status: Married    Spouse name: Mickel Baas   Number of children: 2   Years of education: 16   Highest education level: Bachelor's degree (e.g., BA, AB, BS)  Occupational  History   Occupation: Self-employed    Comment: Patient owns 2 mattress stores  Tobacco Use   Smoking status: Former Smoker    Types: Cigars    Quit date: 02/20/2018    Years since quitting: 1.8   Smokeless tobacco: Never Used  Vaping Use   Vaping Use: Never used  Substance and Sexual Activity   Alcohol use: Yes    Comment: 2-4 beers per week s of 07/19/18   Drug use: No   Sexual activity: Not on file  Other Topics Concern   Not on file  Social History Narrative   Lives with wife, 1 dog. Grown children   Edu: college   Occ: owns mattress store   Activity: active at work, started [employer], wants to restart running   Diet: good water, fruits/vegetables daily   Social Determinants of Radio broadcast assistant Strain:    Difficulty of Paying Living Expenses: Not on file  Food Insecurity:    Worried About Charity fundraiser in the Last Year: Not on file   [employer] of Food in the Last Year: Not on file  Transportation Needs:    Lack of Transportation (Medical): Not on file   Lack of Transportation (Non-Medical): Not on file  Physical Activity:    Days of Exercise per Week: Not on file   Minutes of Exercise per Session: Not on file  Stress:    Feeling of Stress : Not on file  Social Connections:    Frequency of Communication with Friends and Family: Not on file   Frequency of Social Gatherings with Friends and Family: Not on file   Attends Religious Services: Not on file   Active Member of Clubs or Organizations: Not on file   Attends Archivist Meetings: Not on file   Marital Status: Not on file     Family History: The patient's family history includes CAD (age of onset: 48) in his father and mother; Colon cancer in his maternal grandmother; Congenital heart disease in his mother; Crohn's disease in his brother; Hyperlipidemia in his father and mother; Hypertension in his father and mother. There is no history of Diabetes, Colon polyps,  Esophageal cancer, Rectal cancer, or Stomach cancer.  ROS:  Please see the history of present illness.    He snores.  Type A personality.  Obsessive compulsive traits related to physical activity.  Feels he needs to run instead of walk.  Feels he needs to call out with isometric activity rather than backing it down somewhat.  No obvious medication side effects.  All other systems reviewed and are negative.  EKGs/Labs/Other Studies Reviewed:    The following studies were reviewed today: No new data  EKG:  EKG not repeated  Recent Labs: 10/12/2019: ALT 28; BUN 25; Creatinine, Ser 1.26; Hemoglobin 13.2; Platelets 305.0; Potassium 4.2; Sodium 138  Recent Lipid Panel    Component Value Date/Time   CHOL 138 11/08/2019 0918   TRIG 119 11/08/2019 0918   HDL 30 (L) 11/08/2019 0918   CHOLHDL 4.6 11/08/2019 0918   CHOLHDL 4.5 02/20/2018 2345   VLDL 8 02/20/2018 2345   LDLCALC 86 11/08/2019 0918    Physical Exam:    VS:  BP 108/60    Pulse (!) 52    Ht 5' 7"  (1.702 m)    Wt 170 lb (77.1 kg)    SpO2 99%    BMI 26.63 kg/m     Wt Readings from Last 3 Encounters:  01/04/20 170 lb (77.1 kg)  11/25/19 168 lb (76.2 kg)  11/08/19 168 lb 1.6 oz (76.2 kg)     GEN: Slender.. No acute distress HEENT: Normal NECK: No JVD. LYMPHATICS: No lymphadenopathy CARDIAC:  RRR without murmur, gallop, or edema. VASCULAR:  Normal Pulses. No bruits. RESPIRATORY:  Clear to auscultation without rales, wheezing or rhonchi  ABDOMEN: Soft, non-tender, non-distended, No pulsatile mass, MUSCULOSKELETAL: No deformity  SKIN: Warm and dry NEUROLOGIC:  Alert and oriented x 3 PSYCHIATRIC:  Normal affect   ASSESSMENT:    1. Coronary artery disease involving native coronary artery of native heart without angina pectoris   2. Chronic systolic CHF (congestive heart failure) ([place])   3. Hyperlipidemia, unspecified hyperlipidemia type   4. OSA (obstructive sleep apnea)   5. Essential hypertension   6. Warfarin  anticoagulation   7. Educated about COVID-19 virus infection    PLAN:    In order of problems listed above:  1. Secondary prevention is discussed.  LDL is 85.  Continue to diet and exercise.  Continue clinical observation.  Recommended that he consider decreasing intensity of physical activity especially isometric component.  Continue Plavix 75 mg/day. 2. Add spironolactone 12.5 mg Monday Wednesday and Friday.  Discontinue furosemide.  Basic metabolic panel in 2 weeks.  Will consider adding dapagliflozin on next office visit for protection. 3. Continue Zetia, Vascepa, and Lipitor. 4. CPAP compliance. 5. Target blood pressure 130/80 mmHg.  Continue current medical regimen which includes the heart failure therapy noted above.  Furosemide will be discontinued as low-dose mineralocorticoid receptor antagonist is added. 6. Warfarin has been discontinued. 7. Covid vaccine and booster compliant.  Guideline directed therapy for left ventricular systolic dysfunction: Angiotensin receptor-neprilysin inhibitor (ARNI)-Entresto; beta-blocker therapy - carvedilol, metoprolol succinate, or bisoprolol; mineralocorticoid receptor antagonist (MRA) therapy -spironolactone or eplerenone.  SGLT-2 agents -  Dapagliflozin Wilder Glade) or Empagliflozin (Jardiance).These therapies have been shown to improve clinical outcomes including reduction of rehospitalization, survival, and acute heart failure.  Margarite Gouge to schedule an appointment for him to be seen in the advanced heart failure clinic.  This is not necessary.  We can slowly initiate high-level heart failure therapy including spironolactone which is being done today and at some point had an SGLT2.  Patient is  asymptomatic and functioning at a high level.  Want to be careful not to had too much therapy that could impact blood pressure which is relatively low.   Medication Adjustments/Labs and Tests Ordered: Current medicines are reviewed at length with the  patient today.  Concerns regarding medicines are outlined above.  No orders of the defined types were placed in this encounter.  No orders of the defined types were placed in this encounter.   There are no Patient Instructions on file for this visit.   Signed, Sinclair Grooms, MD  01/04/2020 9:01 AM    Jack [name]

## 2020-01-04 ENCOUNTER — Ambulatory Visit (INDEPENDENT_AMBULATORY_CARE_PROVIDER_SITE_OTHER): Payer: BC Managed Care – PPO | Admitting: Interventional Cardiology

## 2020-01-04 ENCOUNTER — Encounter: Payer: Self-pay | Admitting: Interventional Cardiology

## 2020-01-04 ENCOUNTER — Other Ambulatory Visit: Payer: Self-pay

## 2020-01-04 VITALS — BP 108/60 | HR 52 | Ht 67.0 in | Wt 170.0 lb

## 2020-01-04 DIAGNOSIS — E785 Hyperlipidemia, unspecified: Secondary | ICD-10-CM | POA: Diagnosis not present

## 2020-01-04 DIAGNOSIS — I2510 Atherosclerotic heart disease of native coronary artery without angina pectoris: Secondary | ICD-10-CM | POA: Diagnosis not present

## 2020-01-04 DIAGNOSIS — I5022 Chronic systolic (congestive) heart failure: Secondary | ICD-10-CM

## 2020-01-04 DIAGNOSIS — G4733 Obstructive sleep apnea (adult) (pediatric): Secondary | ICD-10-CM

## 2020-01-04 DIAGNOSIS — Z7189 Other specified counseling: Secondary | ICD-10-CM

## 2020-01-04 DIAGNOSIS — Z7901 Long term (current) use of anticoagulants: Secondary | ICD-10-CM

## 2020-01-04 DIAGNOSIS — I10 Essential (primary) hypertension: Secondary | ICD-10-CM

## 2020-01-04 MED ORDER — SPIRONOLACTONE 25 MG PO TABS: 12.5000 mg | ORAL_TABLET | ORAL | 3 refills | Status: DC

## 2020-01-04 NOTE — Patient Instructions (Signed)
Medication Instructions:  1) DISCONTINUE Furosemide 2) START Spironolactone 12.20m once daily on Monday, Wednesday and Friday.  *If you need a refill on your cardiac medications before your next appointment, please call your pharmacy*   Lab Work: BMET in 3 weeks  If you have labs (blood work) drawn today and your tests are completely normal, you will receive your results only by: .Marland KitchenMyChart Message (if you have MyChart) OR . A paper copy in the mail If you have any lab test that is abnormal or we need to change your treatment, we will call you to review the results.   Testing/Procedures: None   Follow-Up: [facility] our [facility].  As part of our continuing mission to provide you with exceptional heart care, we have created designated Provider Care Teams.  These Care Teams include your primary Cardiologist (physician) and Advanced Practice Providers (APPs -  Physician Assistants and Nurse Practitioners) who all work together to provide you with the care you need, when you need it.  We recommend signing up for the patient portal called "MyChart".  Sign up information is provided on this After Visit Summary.  MyChart is used to connect with patients for Virtual Visits (Telemedicine).  Patients are able to view lab/test results, encounter notes, upcoming appointments, etc.  Non-urgent messages can be sent to your provider as well.   To learn more about what you can do with MyChart, go to [email or web address]    Your next appointment:   3-4 month(s)  The format for your next appointment:   In Person  Provider:   You may see HSinclair Grooms MD or one of the following Advanced Practice Providers on your designated Care Team:    LTruitt Merle NP  LCecilie Kicks NP  JKathyrn Drown NP    Other Instructions

## 2020-01-13 ENCOUNTER — Other Ambulatory Visit: Payer: Self-pay | Admitting: Internal Medicine

## 2020-01-16 DIAGNOSIS — K5090 Crohn's disease, unspecified, without complications: Secondary | ICD-10-CM | POA: Diagnosis not present

## 2020-01-18 ENCOUNTER — Telehealth: Payer: Self-pay | Admitting: *Deleted

## 2020-01-18 MED ORDER — AZATHIOPRINE 50 MG PO TABS
150.0000 mg | ORAL_TABLET | Freq: Every day | ORAL | 0 refills | Status: DC
Start: 2020-01-18 — End: 2020-07-16

## 2020-01-18 NOTE — Telephone Encounter (Signed)
Patient's wife came for office visit today. She requested patient to have refill of azathioprine. Dr [name] has okayed rx but patient needs to have CBC, CMP drawn as well.  Will give 1 month AZA and patient needs labs drawn for further refills.

## 2020-01-23 ENCOUNTER — Encounter (HOSPITAL_COMMUNITY): Payer: BC Managed Care – PPO | Admitting: Internal Medicine

## 2020-01-23 ENCOUNTER — Other Ambulatory Visit (INDEPENDENT_AMBULATORY_CARE_PROVIDER_SITE_OTHER): Payer: BC Managed Care – PPO

## 2020-01-23 DIAGNOSIS — D500 Iron deficiency anemia secondary to blood loss (chronic): Secondary | ICD-10-CM | POA: Diagnosis not present

## 2020-01-23 DIAGNOSIS — K50819 Crohn's disease of both small and large intestine with unspecified complications: Secondary | ICD-10-CM

## 2020-01-23 DIAGNOSIS — Z79899 Other long term (current) drug therapy: Secondary | ICD-10-CM

## 2020-01-23 DIAGNOSIS — K219 Gastro-esophageal reflux disease without esophagitis: Secondary | ICD-10-CM | POA: Diagnosis not present

## 2020-01-23 DIAGNOSIS — Z86010 Personal history of colonic polyps: Secondary | ICD-10-CM

## 2020-01-23 LAB — COMPREHENSIVE METABOLIC PANEL
ALT: 23 U/L (ref 0–53)
AST: 18 U/L (ref 0–37)
Albumin: 3.9 g/dL (ref 3.5–5.2)
Alkaline Phosphatase: 46 U/L (ref 39–117)
BUN: 23 mg/dL (ref 6–23)
CO2: 31 mEq/L (ref 19–32)
Calcium: 8.6 mg/dL (ref 8.4–10.5)
Chloride: 105 mEq/L (ref 96–112)
Creatinine, Ser: 1.15 mg/dL (ref 0.40–1.50)
GFR: 73.19 mL/min (ref >60.00)
Glucose, Bld: 93 mg/dL (ref 70–99)
Potassium: 4.2 mEq/L (ref 3.5–5.1)
Sodium: 142 mEq/L (ref 135–145)
Total Bilirubin: 0.8 mg/dL (ref 0.2–1.2)
Total Protein: 6.2 g/dL (ref 6.0–8.3)

## 2020-01-23 LAB — CBC WITH DIFFERENTIAL/PLATELET
Basophils Absolute: 0.1 10*3/uL (ref 0.0–0.1)
Basophils Relative: 0.9 % (ref 0.0–3.0)
Eosinophils Absolute: 0.6 10*3/uL (ref 0.0–0.7)
Eosinophils Relative: 4.9 % (ref 0.0–5.0)
HCT: 40.6 % (ref 39.0–52.0)
Hemoglobin: 13.4 g/dL (ref 13.0–17.0)
Lymphocytes Relative: 17.7 % (ref 12.0–46.0)
Lymphs Abs: 2.2 10*3/uL (ref 0.7–4.0)
MCHC: 33.1 g/dL (ref 30.0–36.0)
MCV: 90.6 fl (ref 78.0–100.0)
Monocytes Absolute: 1.4 10*3/uL — ABNORMAL HIGH (ref 0.1–1.0)
Monocytes Relative: 11.4 % (ref 3.0–12.0)
Neutro Abs: 8 10*3/uL — ABNORMAL HIGH (ref 1.4–7.7)
Neutrophils Relative %: 65.1 % (ref 43.0–77.0)
Platelets: 254 10*3/uL (ref 150.0–400.0)
RBC: 4.49 Mil/uL (ref 4.22–5.81)
RDW: 15.7 % — ABNORMAL HIGH (ref 11.5–15.5)
WBC: 12.2 10*3/uL — ABNORMAL HIGH (ref 4.0–10.5)

## 2020-01-27 ENCOUNTER — Other Ambulatory Visit: Payer: BC Managed Care – PPO | Admitting: *Deleted

## 2020-01-27 ENCOUNTER — Other Ambulatory Visit: Payer: Self-pay

## 2020-01-27 DIAGNOSIS — I2510 Atherosclerotic heart disease of native coronary artery without angina pectoris: Secondary | ICD-10-CM | POA: Diagnosis not present

## 2020-01-27 DIAGNOSIS — I5022 Chronic systolic (congestive) heart failure: Secondary | ICD-10-CM | POA: Diagnosis not present

## 2020-01-27 LAB — BASIC METABOLIC PANEL
BUN/Creatinine Ratio: 14 (ref 9–20)
BUN: 16 mg/dL (ref 6–24)
CO2: 28 mmol/L (ref 20–29)
Calcium: 9.3 mg/dL (ref 8.7–10.2)
Chloride: 102 mmol/L (ref 96–106)
Creatinine, Ser: 1.11 mg/dL (ref 0.76–1.27)
GFR calc Af Amer: 88 mL/min/{1.73_m2} (ref >59)
GFR calc non Af Amer: 76 mL/min/{1.73_m2} (ref >59)
Glucose: 92 mg/dL (ref 65–99)
Potassium: 5.1 mmol/L (ref 3.5–5.2)
Sodium: 141 mmol/L (ref 134–144)

## 2020-01-30 ENCOUNTER — Other Ambulatory Visit: Payer: Self-pay | Admitting: *Deleted

## 2020-01-30 MED ORDER — SPIRONOLACTONE 25 MG PO TABS: ORAL_TABLET | ORAL | 3 refills | Status: DC

## 2020-02-01 ENCOUNTER — Encounter (HOSPITAL_COMMUNITY): Payer: BC Managed Care – PPO | Admitting: Internal Medicine

## 2020-02-14 DIAGNOSIS — K5090 Crohn's disease, unspecified, without complications: Secondary | ICD-10-CM | POA: Diagnosis not present

## 2020-02-15 ENCOUNTER — Other Ambulatory Visit: Payer: Self-pay | Admitting: Interventional Cardiology

## 2020-02-15 ENCOUNTER — Other Ambulatory Visit: Payer: BC Managed Care – PPO

## 2020-02-27 ENCOUNTER — Other Ambulatory Visit: Payer: Self-pay | Admitting: Family Medicine

## 2020-03-11 ENCOUNTER — Other Ambulatory Visit: Payer: Self-pay | Admitting: Interventional Cardiology

## 2020-03-12 ENCOUNTER — Other Ambulatory Visit: Payer: Self-pay | Admitting: Interventional Cardiology

## 2020-03-14 DIAGNOSIS — K5090 Crohn's disease, unspecified, without complications: Secondary | ICD-10-CM | POA: Diagnosis not present

## 2020-04-12 DIAGNOSIS — K5090 Crohn's disease, unspecified, without complications: Secondary | ICD-10-CM | POA: Diagnosis not present

## 2020-04-17 ENCOUNTER — Telehealth: Payer: Self-pay

## 2020-04-17 NOTE — Telephone Encounter (Signed)
**Note De-Identified Aliha Diedrich Obfuscation** Per Delene Loll PA request from [place] received [place] Koranda fax I started a [place] PA through covermymeds and received this message: [place] Jenifer Key: [facility] Outcome: The member recently filled this medication and will be able to return for their next refill according to their plan limits. Drug Entresto 24-26MG tablets Form: Librarian, academic PA Form [phone] NCPDP)  I called [facility] and was advised that the pt picked up his last Entresto refill in November and is not due for another refill until 05/12/20 so a PA is not required at this time and that maybe the pt requested his refill to soon.

## 2020-04-19 NOTE — Progress Notes (Signed)
Cardiology Office Note:    Date:  04/23/2020   ID:  Jack [name], DOB 08/11/1965, MRN [id number]  PCP:  [name], MD  Cardiologist:  Sinclair Grooms, MD   Referring MD: [name], MD   Chief Complaint  Patient presents with  . Coronary Artery Disease    History of Present Illness:    Jack [name] is a 55 y.o. male with a hx of CAD status post STEMI 11/2017 treated with DES to the LAD with residual moderate CAD in the circumflex and RCA. Developed post infarct pericarditis, decreased LV systolic function EF 85% with LV thrombus requiring Coumadin. Developed a significant GI bleed on Coumadin and dual antiplatelet therapy. Also has essential hypertension, seizure disorder, eosinophilic granulomatosis, Crohn's disease removal of intra-dural meningioma of the lower spine.  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  He is doing well without cardiac complaints.  Denies orthopnea and PND.  Not as physically active as he was immediately following his myocardial infarction.  He has not had syncope, edema, palpitations, neurological complaints.  Past Medical History:  Diagnosis Date  . Acquired renal cyst of right kidney 08/31/2018   2.8cm R upper pole rec monitor with yearly imaging (09/2018)  . Allergy   . BPH (benign prostatic hypertrophy)   . CAD (coronary artery disease)    a. anterior STEMI 01/2018 -  proximal occlusion of LAD, treated with DES. Cath also showed 20% distal LM, 95% ostial-prox small-moderate ramus, 70-80% ostial Cx, 70% dominant ostial OM, 50-60% prox Cx, 70% RCA. EF 35% by cath with LVEDP 60mHg. Med rx for residual disease. Course complicated by post MI pericarditis and LV thrombus.  . Chronic systolic (congestive) heart failure ([place]   . Colitis   . Colon polyp    inflammatory  . Crohn disease ([place] 1992   history uveitis, involvement of intestines and lungs  . Diverticulosis   . Eosinophilic granuloma  ([place]   . Essential hypertension   . GERD (gastroesophageal reflux disease)   . GI bleeding   . History of chicken pox   . History of gastroesophageal reflux (GERD)   . History of seizure 1995   grand mal x1, completed 6 yrs dilantin. no seizures since  . Hyperlipidemia   . Internal hemorrhoids   . Ischemic cardiomyopathy   . LV (left ventricular) mural thrombus following MI ([place] 01/2018  . Myocardial infarction ([place]   . Osteoporosis 11/2015   DEXA T -2.9  . Schwannoma 2007   L axilla s/p surgery  . Seizures ([place]    last seizure 1995 and only x 1 seizure-   . Ulnar neuropathy    h/o this from L arm schwannoma    Past Surgical History:  Procedure Laterality Date  . APPENDECTOMY  2000  . BACK SURGERY  2011   lumbar  . CARDIAC CATHETERIZATION    . CARDIOVASCULAR STRESS TEST  01/2015   low risk study  . CHOLECYSTECTOMY  2000  . COLONOSCOPY  08/2014   f/u crohn's, 4 inflammatory polyps, diverticulosis, improved, rpt 2 yrs ([place])  . COLONOSCOPY  11/2018   multiple polyps, 1 TA, rpt 2 yrs ([place])  . COLONOSCOPY WITH PROPOFOL N/A 04/09/2018   inflammatory polyp, f/u left to primary GI - [place] ([place], HKirke Corin MD)  . CORONARY ANGIOGRAPHY N/A 02/21/2018   Procedure: CORONARY ANGIOGRAPHY;  Surgeon: SBelva Crome MD;  [facility] [place];  Service: Cardiovascular;  Laterality: N/A;  . CORONARY/GRAFT ACUTE MI REVASCULARIZATION N/A 02/20/2018   Procedure: Coronary/Graft Acute MI Revascularization;  Surgeon: Belva Crome, MD;  Location: [place] [facility];  Service: Cardiovascular;  Laterality: N/A;  . ESOPHAGOGASTRODUODENOSCOPY  07/2014   h/o EE resolved, focal reflux esophagitis, chronic active gastritis  . ESOPHAGOGASTRODUODENOSCOPY  11/2018   gastritis, no active crohn's ([place])  . extremity surgery Left    ulnar nerve repair after schwannoma removal  . FLEXIBLE SIGMOIDOSCOPY N/A 04/14/2018   Procedure: FLEXIBLE SIGMOIDOSCOPY;  Surgeon: Milus Banister,  MD;  Location[facility]COPY;  Service: Endoscopy;  Laterality: N/A;  . INGUINAL HERNIA REPAIR Bilateral 2017  . LAMINECTOMY N/A 08/09/2018   Procedure: Lumbar three Laminectomy, excision of intradural tumor;  Surgeon: Kristeen Miss, MD;  Location: [place];  Service: Neurosurgery;  Laterality: N/A;  . LEFT HEART CATH AND CORONARY ANGIOGRAPHY N/A 02/20/2018   Procedure: LEFT HEART CATH AND CORONARY ANGIOGRAPHY;  Surgeon: Belva Crome, MD;  Location: [place] [facility];  Service: Cardiovascular;  Laterality: N/A;  . LUMBAR SPINE SURGERY  2011  . POLYPECTOMY  04/09/2018   Procedure: POLYPECTOMY;  Surgeon: Doran Stabler, MD;  Location: [facility] END[facility]COPY;  Service: Gastroenterology;;  . SPINAL CORD STIMULATOR IMPLANT  2012   x2 (Dr Berton Mount)  . SUBMUCOSAL INJECTION  04/09/2018   Procedure: SUBMUCOSAL INJECTION;  Surgeon: Doran Stabler, MD;  Location: [facility] ENDOSCOPY;  Service: Gastroenterology;;  . TONSILLECTOMY  1996  . TUMOR REMOVAL Left 2007   schwannoma from L armpit  . UPPER GASTROINTESTINAL ENDOSCOPY      Current Medications: Current Meds  Medication Sig  . acetaminophen (TYLENOL) 325 MG tablet Take 650 mg by mouth every 6 (six) hours as needed for mild pain or headache.   . alendronate (FOSAMAX) 70 MG tablet Take 70 mg by mouth every Saturday.   [name] atorvastatin (LIPITOR) 80 MG tablet TAKE 1 TABLET (80 MG TOTAL) BY MOUTH DAILY AT 6 PM.  . azaTHIOprine (IMURAN) 50 MG tablet Take 3 tablets (150 mg total) by mouth daily. D/C any remaining script for 30 day supply please  . Calcium Carbonate-Vitamin D3 (CALCIUM 600/VITAMIN D) 600-400 MG-UNIT TABS Take 1 tablet by mouth daily.  . carvedilol (COREG) 6.25 MG tablet TAKE 1 TABLET BY MOUTH TWICE A DAY  . clopidogrel (PLAVIX) 75 MG tablet TAKE 1 TABLET BY MOUTH EVERY DAY  . diphenoxylate-atropine (LOMOTIL) 2.5-0.025 MG tablet TAKE 1-2 TABLETS BY MOUTH 4 TIMES A DAY AS NEEDED FOR DIARRHEA  . doxycycline (VIBRA-TABS) 100 MG tablet Take 1 tablet (100 mg  total) by mouth 2 (two) times daily.  [name] ENTRESTO 24-26 MG TAKE 1 TABLET BY MOUTH 2 (TWO) TIMES DAILY.  [name] guaiFENesin-codeine (CHERATUSSIN AC) 100-10 MG/5ML syrup Take 5 mLs by mouth 2 (two) times daily as needed for cough (sedation precautions).  . hydrOXYzine (ATARAX/VISTARIL) 25 MG tablet Take 3 tablets (75 mg total) by mouth at bedtime.  . Iron, Ferrous Sulfate, 325 (65 Fe) MG TABS Take 1 tablet by mouth daily.  . Multiple Vitamin (MULTIVITAMIN) capsule Take 1 capsule by mouth daily.  . nitroGLYCERIN (NITROSTAT) 0.4 MG SL tablet Place 1 tablet (0.4 mg total) under the tongue every 5 (five) minutes as needed for chest pain.  . pantoprazole (PROTONIX) 40 MG tablet TAKE 1 TABLET BY MOUTH EVERY DAY  . predniSONE (DELTASONE) 1 MG tablet Take as directed (currently on 21 mg daily- decrease by 1 mg as tolerated)  . predniSONE (DELTASONE)  10 MG tablet Take as directed (Currently taking 21 mg daily- decrease by 1 mg as tolerated)  . predniSONE (DELTASONE) 5 MG tablet Take as directed. (currently on 21 mg daily- decrease by 1 mg as tolerated)  . spironolactone (ALDACTONE) 25 MG tablet Take a half tablet (12.18m total) by mouth once daily on Monday and Thursday.  . tadalafil (CIALIS) 10 MG tablet Take 1 tablet (10 mg total) by mouth daily as needed for erectile dysfunction.  . tamsulosin (FLOMAX) 0.4 MG CAPS capsule TAKE 1 CAPSULE BY MOUTH EVERY DAY  . VASCEPA 1 g capsule TAKE 2 CAPSULES (2 G TOTAL) BY MOUTH 2 (TWO) TIMES DAILY.  . vedolizumab (ENTYVIO) 300 MG injection Inject 300 mg into the vein. Every 4 weeks     Allergies:   Cephalexin and Duloxetine   Social History   Socioeconomic History  . Marital status: Married    Spouse name: LMickel Baas . Number of children: 2  . Years of education: 16  . Highest education level: Bachelor's degree (e.g., BA, AB, BS)  Occupational History  . Occupation: Self-employed    Comment: Patient owns 2 mattress stores  Tobacco Use  . Smoking status: Former  Smoker    Types: Cigars    Quit date: 02/20/2018    Years since quitting: 2.1  . Smokeless tobacco: Never Used  Vaping Use  . Vaping Use: Never used  Substance and Sexual Activity  . Alcohol use: Yes    Comment: 2-4 beers per week s of 07/19/18  . Drug use: No  . Sexual activity: Not on file  Other Topics Concern  . Not on file  Social History Narrative   Lives with wife, 1 dog. Grown children   Edu: college   Occ: owns mattress store   Activity: active at work, started [employer] wants to restart running   Diet: good water, fruits/vegetables daily   Social Determinants of HRadio broadcast assistantStrain: Not on file  Food Insecurity: Not on file  Transportation Needs: Not on file  Physical Activity: Not on file  Stress: Not on file  Social Connections: Not on file     Family History: The patient's family history includes CAD (age of onset: 780 in his father and mother; Colon cancer in his maternal grandmother; Congenital heart disease in his mother; Crohn's disease in his brother; Hyperlipidemia in his father and mother; Hypertension in his father and mother. There is no history of Diabetes, Colon polyps, Esophageal cancer, Rectal cancer, or Stomach cancer.  ROS:   Please see the history of present illness.    He is on prednisone and dependent.  This is for inflammatory bowel disease.  Since being off Coumadin he has had no recurrence of melena/bright red blood per rectum.  All other systems reviewed and are negative.  EKGs/Labs/Other Studies Reviewed:    The following studies were reviewed today: 2D Doppler echocardiogram August 2021: IMPRESSIONS    1. Akinesis of the mid to apical septum, apcial anterior, apical  anterolateral and apical myocardium. Consistent with prior LAD infarct.  There is no evidence of apical thrombus, though there is swirling and  sluggish flow of Definity contrast in the  apex. Left ventricular ejection fraction, by estimation, is 35 to  40%. The  left ventricle has moderately decreased function. The left ventricle  demonstrates regional wall motion abnormalities (see scoring  diagram/findings for description). Left  ventricular diastolic parameters are consistent with Grade I diastolic  dysfunction (impaired relaxation).  2.  Right ventricular systolic function is normal. The right ventricular  size is normal. There is normal pulmonary artery systolic pressure.  3. The mitral valve is normal in structure. Mild mitral valve  regurgitation. No evidence of mitral stenosis.  4. The aortic valve is tricuspid. Aortic valve regurgitation is mild. No  aortic stenosis is present.  5. The inferior vena cava is normal in size with greater than 50%  respiratory variability, suggesting right atrial pressure of 3 mmHg.   EKG:  EKG not repeated  Recent Labs: 01/23/2020: ALT 23; Hemoglobin 13.4; Platelets 254.0 01/27/2020: BUN 16; Creatinine, Ser 1.11; Potassium 5.1; Sodium 141  Recent Lipid Panel    Component Value Date/Time   CHOL 138 11/08/2019 0918   TRIG 119 11/08/2019 0918   HDL 30 (L) 11/08/2019 0918   CHOLHDL 4.6 11/08/2019 0918   CHOLHDL 4.5 02/20/2018 2345   VLDL 8 02/20/2018 2345   LDLCALC 86 11/08/2019 0918    Physical Exam:    VS:  BP 118/66   Pulse 62   Ht 5' 7"  (1.702 m)   Wt 177 lb 3.2 oz (80.4 kg)   SpO2 98%   BMI 27.75 kg/m     Wt Readings from Last 3 Encounters:  04/23/20 177 lb 3.2 oz (80.4 kg)  01/04/20 170 lb (77.1 kg)  11/25/19 168 lb (76.2 kg)     GEN: Slightly overweight. No acute distress HEENT: Normal NECK: No JVD. LYMPHATICS: No lymphadenopathy CARDIAC: No murmur. RRR no gallop, or edema. VASCULAR:  Normal Pulses. No bruits. RESPIRATORY:  Clear to auscultation without rales, wheezing or rhonchi  ABDOMEN: Soft, non-tender, non-distended, No pulsatile mass, MUSCULOSKELETAL: No deformity  SKIN: Warm and dry NEUROLOGIC:  Alert and oriented x 3 PSYCHIATRIC:  Normal affect    ASSESSMENT:    1. Coronary artery disease involving native coronary artery of native heart without angina pectoris   2. Chronic systolic CHF (congestive heart failure) ([place])   3. Hyperlipidemia, unspecified hyperlipidemia type   4. OSA (obstructive sleep apnea)   5. Essential hypertension   6. Warfarin anticoagulation   7. Educated about COVID-19 virus infection    PLAN:    In order of problems listed above:  1. Secondary prevention discussed.  Increasing physical activity is encouraged. 2. Continue spironolactone 12.5 mg Monday and Thursday, Entresto 24/26 mg twice daily, and carvedilol 6.25 mg p.o. twice daily.  May need to consider SGLT2 therapy.  Most recent echo demonstrated EF to be approximately 40%. 3. Continue Zetia 10 mg daily Vascepa 1 g tablets 2 tablets by mouth twice daily and Lipitor 80 mg/day.  LDL was 86. 4. Compliant with CPAP. 5. Compliant with medication regimen.  Heart failure therapy is continued and controls blood pressure as well. 6. Coumadin has been discontinued.  He was on it because of apical thrombus.  This is subsequently resolved.  He bled on Coumadin related to inflammatory bowel disease. 7. Vaccinated and practicing social medication.  Clinical follow-up in 1 year.  Overall education and awareness concerning primary/secondary risk prevention was discussed in detail: LDL less than 70, hemoglobin A1c less than 7, blood pressure target less than 130/80 mmHg, >150 minutes of moderate aerobic activity per week, avoidance of smoking, weight control (via diet and exercise), and continued surveillance/management of/for obstructive sleep apnea.    Medication Adjustments/Labs and Tests Ordered: Current medicines are reviewed at length with the patient today.  Concerns regarding medicines are outlined above.  No orders of the defined types were placed in this  encounter.  No orders of the defined types were placed in this encounter.   Patient Instructions   Medication Instructions:  Your physician recommends that you continue on your current medications as directed. Please refer to the Current Medication list given to you today.  *If you need a refill on your cardiac medications before your next appointment, please call your pharmacy*   Lab Work: None If you have labs (blood work) drawn today and your tests are completely normal, you will receive your results only by: [name] MyChart Message (if you have MyChart) OR . A paper copy in the mail If you have any lab test that is abnormal or we need to change your treatment, we will call you to review the results.   Testing/Procedures: None   Follow-Up: At [facility], you and your health needs are our priority.  As part of our continuing mission to provide you with exceptional heart care, we have created designated Provider Care Teams.  These Care Teams include your primary Cardiologist (physician) and Advanced Practice Providers (APPs -  Physician Assistants and Nurse Practitioners) who all work together to provide you with the care you need, when you need it.  We recommend signing up for the patient portal called "MyChart".  Sign up information is provided on this After Visit Summary.  MyChart is used to connect with patients for Virtual Visits (Telemedicine).  Patients are able to view lab/test results, encounter notes, upcoming appointments, etc.  Non-urgent messages can be sent to your provider as well.   To learn more about what you can do with MyChart, go to [email or web address].    Your next appointment:   1 year(s)  The format for your next appointment:   In Person  Provider:   You may see Sinclair Grooms, MD or one of the following Advanced Practice Providers on your designated Care Team:    Cecilie Kicks, NP  Kathyrn Drown, NP    Other Instructions      Signed, Sinclair Grooms, MD  04/23/2020 9:22 AM    [place]

## 2020-04-23 ENCOUNTER — Encounter: Payer: Self-pay | Admitting: Interventional Cardiology

## 2020-04-23 ENCOUNTER — Ambulatory Visit (INDEPENDENT_AMBULATORY_CARE_PROVIDER_SITE_OTHER): Payer: BC Managed Care – PPO | Admitting: Interventional Cardiology

## 2020-04-23 ENCOUNTER — Other Ambulatory Visit: Payer: Self-pay

## 2020-04-23 VITALS — BP 118/66 | HR 62 | Ht 67.0 in | Wt 177.2 lb

## 2020-04-23 DIAGNOSIS — I5022 Chronic systolic (congestive) heart failure: Secondary | ICD-10-CM | POA: Diagnosis not present

## 2020-04-23 DIAGNOSIS — I10 Essential (primary) hypertension: Secondary | ICD-10-CM

## 2020-04-23 DIAGNOSIS — E785 Hyperlipidemia, unspecified: Secondary | ICD-10-CM | POA: Diagnosis not present

## 2020-04-23 DIAGNOSIS — I2510 Atherosclerotic heart disease of native coronary artery without angina pectoris: Secondary | ICD-10-CM | POA: Diagnosis not present

## 2020-04-23 DIAGNOSIS — G4733 Obstructive sleep apnea (adult) (pediatric): Secondary | ICD-10-CM | POA: Diagnosis not present

## 2020-04-23 DIAGNOSIS — Z7901 Long term (current) use of anticoagulants: Secondary | ICD-10-CM

## 2020-04-23 DIAGNOSIS — Z7189 Other specified counseling: Secondary | ICD-10-CM

## 2020-04-23 NOTE — Patient Instructions (Signed)
Medication Instructions:  Your physician recommends that you continue on your current medications as directed. Please refer to the Current Medication list given to you today.  *If you need a refill on your cardiac medications before your next appointment, please call your pharmacy*   Lab Work: None If you have labs (blood work) drawn today and your tests are completely normal, you will receive your results only by: [name] MyChart Message (if you have MyChart) OR . A paper copy in the mail If you have any lab test that is abnormal or we need to change your treatment, we will call you to review the results.   Testing/Procedures: None   Follow-Up: At [facility] [place], you and your health needs are our priority.  As part of our continuing mission to provide you with exceptional heart care, we have created designated Provider Care Teams.  These Care Teams include your primary Cardiologist (physician) and Advanced Practice Providers (APPs -  Physician Assistants and Nurse Practitioners) who all work together to provide you with the care you need, when you need it.  We recommend signing up for the patient portal called "MyChart".  Sign up information is provided on this After Visit Summary.  MyChart is used to connect with patients for Virtual Visits (Telemedicine).  Patients are able to view lab/test results, encounter notes, upcoming appointments, etc.  Non-urgent messages can be sent to your provider as well.   To learn more about what you can do with MyChart, go to [email or web address].    Your next appointment:   1 year(s)  The format for your next appointment:   In Person  Provider:   You may see Sinclair Grooms, MD or one of the following Advanced Practice Providers on your designated Care Team:    Cecilie Kicks, NP  Kathyrn Drown, NP    Other Instructions

## 2020-04-25 ENCOUNTER — Other Ambulatory Visit: Payer: Self-pay

## 2020-04-25 DIAGNOSIS — K50819 Crohn's disease of both small and large intestine with unspecified complications: Secondary | ICD-10-CM

## 2020-05-10 DIAGNOSIS — K5090 Crohn's disease, unspecified, without complications: Secondary | ICD-10-CM | POA: Diagnosis not present

## 2020-05-11 ENCOUNTER — Telehealth: Payer: Self-pay | Admitting: Family Medicine

## 2020-05-11 NOTE — Telephone Encounter (Signed)
E-scribed refill.  Plz schedule lab and cpe visits.  

## 2020-05-14 NOTE — Telephone Encounter (Signed)
Noted  

## 2020-05-14 NOTE — Telephone Encounter (Signed)
Called and left vm in detail per DPR to schedule his cpe and lab work. EM

## 2020-05-17 DIAGNOSIS — Z79899 Other long term (current) drug therapy: Secondary | ICD-10-CM | POA: Diagnosis not present

## 2020-05-17 DIAGNOSIS — I776 Arteritis, unspecified: Secondary | ICD-10-CM | POA: Diagnosis not present

## 2020-05-17 DIAGNOSIS — Z7952 Long term (current) use of systemic steroids: Secondary | ICD-10-CM | POA: Diagnosis not present

## 2020-05-17 DIAGNOSIS — K5090 Crohn's disease, unspecified, without complications: Secondary | ICD-10-CM | POA: Diagnosis not present

## 2020-06-04 ENCOUNTER — Ambulatory Visit (INDEPENDENT_AMBULATORY_CARE_PROVIDER_SITE_OTHER): Payer: BC Managed Care – PPO | Admitting: Family Medicine

## 2020-06-04 ENCOUNTER — Other Ambulatory Visit (INDEPENDENT_AMBULATORY_CARE_PROVIDER_SITE_OTHER): Payer: BC Managed Care – PPO

## 2020-06-04 ENCOUNTER — Other Ambulatory Visit: Payer: Self-pay

## 2020-06-04 ENCOUNTER — Encounter: Payer: Self-pay | Admitting: Family Medicine

## 2020-06-04 VITALS — BP 116/62 | HR 71 | Temp 97.7°F | Ht 67.0 in | Wt 172.4 lb

## 2020-06-04 DIAGNOSIS — Z23 Encounter for immunization: Secondary | ICD-10-CM | POA: Diagnosis not present

## 2020-06-04 DIAGNOSIS — F5104 Psychophysiologic insomnia: Secondary | ICD-10-CM | POA: Diagnosis not present

## 2020-06-04 DIAGNOSIS — K50819 Crohn's disease of both small and large intestine with unspecified complications: Secondary | ICD-10-CM

## 2020-06-04 DIAGNOSIS — G4700 Insomnia, unspecified: Secondary | ICD-10-CM | POA: Insufficient documentation

## 2020-06-04 LAB — CBC WITH DIFFERENTIAL/PLATELET
Basophils Absolute: 0.1 10*3/uL (ref 0.0–0.1)
Basophils Relative: 0.6 % (ref 0.0–3.0)
Eosinophils Absolute: 0.1 10*3/uL (ref 0.0–0.7)
Eosinophils Relative: 1.3 % (ref 0.0–5.0)
HCT: 41.5 % (ref 39.0–52.0)
Hemoglobin: 13.3 g/dL (ref 13.0–17.0)
Lymphocytes Relative: 11.9 % — ABNORMAL LOW (ref 12.0–46.0)
Lymphs Abs: 1.3 10*3/uL (ref 0.7–4.0)
MCHC: 32.2 g/dL (ref 30.0–36.0)
MCV: 90.2 fl (ref 78.0–100.0)
Monocytes Absolute: 1 10*3/uL (ref 0.1–1.0)
Monocytes Relative: 9.4 % (ref 3.0–12.0)
Neutro Abs: 8.5 10*3/uL — ABNORMAL HIGH (ref 1.4–7.7)
Neutrophils Relative %: 76.8 % (ref 43.0–77.0)
Platelets: 292 10*3/uL (ref 150.0–400.0)
RBC: 4.6 Mil/uL (ref 4.22–5.81)
RDW: 14.5 % (ref 11.5–15.5)
WBC: 11 10*3/uL — ABNORMAL HIGH (ref 4.0–10.5)

## 2020-06-04 LAB — COMPREHENSIVE METABOLIC PANEL
ALT: 35 U/L (ref 0–53)
AST: 25 U/L (ref 0–37)
Albumin: 4 g/dL (ref 3.5–5.2)
Alkaline Phosphatase: 37 U/L — ABNORMAL LOW (ref 39–117)
BUN: 18 mg/dL (ref 6–23)
CO2: 30 mEq/L (ref 19–32)
Calcium: 8.8 mg/dL (ref 8.4–10.5)
Chloride: 105 mEq/L (ref 96–112)
Creatinine, Ser: 1.25 mg/dL (ref 0.40–1.50)
GFR: 66.05 mL/min (ref >60.00)
Glucose, Bld: 98 mg/dL (ref 70–99)
Potassium: 4.9 mEq/L (ref 3.5–5.1)
Sodium: 141 mEq/L (ref 135–145)
Total Bilirubin: 0.8 mg/dL (ref 0.2–1.2)
Total Protein: 6.7 g/dL (ref 6.0–8.3)

## 2020-06-04 MED ORDER — AMITRIPTYLINE HCL 25 MG PO TABS: 25.0000 mg | ORAL_TABLET | Freq: Every evening | ORAL | 3 refills | Status: DC | PRN

## 2020-06-04 NOTE — Progress Notes (Signed)
Patient ID: Jack [name], male    DOB: [birth year], 55 y.o.   MRN: [id number]  This visit was conducted in person.  BP 116/62   Pulse 71   Temp 97.7 F (36.5 C) (Temporal)   Ht 5' 7"  (1.702 m)   Wt 172 lb 6 oz (78.2 kg)   SpO2 99%   BMI 27.00 kg/m    CC: insomnia  Subjective:   HPI: Jack [name] is a 55 y.o. male presenting on 06/04/2020 for Insomnia (C/o trouble staying asleep.  Started mos ago.  Tried wife's med, amitriptyline, which is helping. )   Several months of sleep maintenance insomnia.  Increased stressors - he had 2 separate jobs but he's since quit 1 job.  Good bedtime routine - bedtime <10pm, same routine every night, calm quiet dark environment to sleep, good time between dinner and bedtime, no daytime naps. Occasional alcohol at night.   He already takes melatonin 27m liquid at night.   Started taking wife's amitriptyline 212mwith benefit - started this about 1 wk ago.  He stopped hydroxyzine - was using [id number] itching. No longer itching.  Wife has noted increased leg movements in sleep - better the last few nights.   No PNdyspnea, no witnessed apneic episodes, no daytime somnolence, does feel rested when he wakes up, no morning headache. Sometimes he shakes in his sleep.      Relevant past medical, surgical, family and social history reviewed and updated as indicated. Interim medical history since our last visit reviewed. Allergies and medications reviewed and updated. Outpatient Medications Prior to Visit  Medication Sig Dispense Refill  . acetaminophen (TYLENOL) 325 MG tablet Take 650 mg by mouth every 6 (six) hours as needed for mild pain or headache.     . alendronate (FOSAMAX) 70 MG tablet Take 70 mg by mouth every Saturday.   11  . atorvastatin (LIPITOR) 80 MG tablet TAKE 1 TABLET (80 MG TOTAL) BY MOUTH DAILY AT 6 PM. 90 tablet 3  . azaTHIOprine (IMURAN) 50 MG tablet Take 3 tablets (150 mg total) by mouth daily. D/C any remaining  script for 30 day supply please 270 tablet 0  . Calcium Carbonate-Vitamin D3 (CALCIUM 600/VITAMIN D) 600-400 MG-UNIT TABS Take 1 tablet by mouth daily.    . carvedilol (COREG) 6.25 MG tablet TAKE 1 TABLET BY MOUTH TWICE A DAY 180 tablet 3  . clopidogrel (PLAVIX) 75 MG tablet TAKE 1 TABLET BY MOUTH EVERY DAY 90 tablet 3  . diphenoxylate-atropine (LOMOTIL) 2.5-0.025 MG tablet TAKE 1-2 TABLETS BY MOUTH 4 TIMES A DAY AS NEEDED FOR DIARRHEA 90 tablet 2  . doxycycline (VIBRA-TABS) 100 MG tablet Take 1 tablet (100 mg total) by mouth 2 (two) times daily. 10 tablet 0  . ENTRESTO 24-26 MG TAKE 1 TABLET BY MOUTH 2 (TWO) TIMES DAILY. 180 tablet 3  . guaiFENesin-codeine (CHERATUSSIN AC) 100-10 MG/5ML syrup Take 5 mLs by mouth 2 (two) times daily as needed for cough (sedation precautions). 120 mL 0  . Iron, Ferrous Sulfate, 325 (65 Fe) MG TABS Take 1 tablet by mouth daily.    . Multiple Vitamin (MULTIVITAMIN) capsule Take 1 capsule by mouth daily.    . nitroGLYCERIN (NITROSTAT) 0.4 MG SL tablet Place 1 tablet (0.4 mg total) under the tongue every 5 (five) minutes as needed for chest pain. 25 tablet 3  . pantoprazole (PROTONIX) 40 MG tablet TAKE 1 TABLET BY MOUTH EVERY DAY 90 tablet 0  . predniSONE (DELTASONE)  1 MG tablet Take as directed (currently on 21 mg daily- decrease by 1 mg as tolerated) 100 tablet 0  . predniSONE (DELTASONE) 10 MG tablet Take as directed (Currently taking 21 mg daily- decrease by 1 mg as tolerated) 100 tablet 0  . predniSONE (DELTASONE) 5 MG tablet Take as directed. (currently on 21 mg daily- decrease by 1 mg as tolerated) 100 tablet 0  . spironolactone (ALDACTONE) 25 MG tablet Take a half tablet (12.34m total) by mouth once daily on Monday and Thursday. 45 tablet 3  . tadalafil (CIALIS) 10 MG tablet Take 1 tablet (10 mg total) by mouth daily as needed for erectile dysfunction. 20 tablet 3  . tamsulosin (FLOMAX) 0.4 MG CAPS capsule TAKE 1 CAPSULE BY MOUTH EVERY DAY 90 capsule 0  .  VASCEPA 1 g capsule TAKE 2 CAPSULES (2 G TOTAL) BY MOUTH 2 (TWO) TIMES DAILY. 360 capsule 3  . vedolizumab (ENTYVIO) 300 MG injection Inject 300 mg into the vein. Every 4 weeks    . ezetimibe (ZETIA) 10 MG tablet Take 1 tablet (10 mg total) by mouth daily. 90 tablet 3  . hydrOXYzine (ATARAX/VISTARIL) 25 MG tablet Take 3 tablets (75 mg total) by mouth at bedtime. 30 tablet 2   Facility-Administered Medications Prior to Visit  Medication Dose Route Frequency Provider Last Rate Last Admin  . ondansetron (ZOFRAN) 4 mg in sodium chloride 0.9 % 50 mL IVPB  4 mg Intravenous Q6H PRN EKristeen Miss MD         Per HPI unless specifically indicated in ROS section below Review of Systems Objective:  BP 116/62   Pulse 71   Temp 97.7 F (36.5 C) (Temporal)   Ht 5' 7"  (1.702 m)   Wt 172 lb 6 oz (78.2 kg)   SpO2 99%   BMI 27.00 kg/m   Wt Readings from Last 3 Encounters:  06/04/20 172 lb 6 oz (78.2 kg)  04/23/20 177 lb 3.2 oz (80.4 kg)  01/04/20 170 lb (77.1 kg)      Physical Exam Vitals and nursing note reviewed.  Constitutional:      Appearance: Normal appearance. He is not ill-appearing.  Cardiovascular:     Rate and Rhythm: Normal rate and regular rhythm.     Pulses: Normal pulses.     Heart sounds: Normal heart sounds. No murmur heard.   Pulmonary:     Effort: Pulmonary effort is normal. No respiratory distress.     Breath sounds: Normal breath sounds. No wheezing, rhonchi or rales.  Neurological:     Mental Status: He is alert.  Psychiatric:        Mood and Affect: Mood normal.        Behavior: Behavior normal.       Results for orders placed or performed in visit on 110/17/51 Basic metabolic panel  Result Value Ref Range   Glucose 92 65 - 99 mg/dL   BUN 16 6 - 24 mg/dL   Creatinine, Ser 1.11 0.76 - 1.27 mg/dL   GFR calc non Af Amer 76 >59 mL/min/1.73   GFR calc Af Amer 88 >59 mL/min/1.73   BUN/Creatinine Ratio 14 9 - 20   Sodium 141 134 - 144 mmol/L   Potassium 5.1  3.5 - 5.2 mmol/L   Chloride 102 96 - 106 mmol/L   CO2 28 20 - 29 mmol/L   Calcium 9.3 8.7 - 10.2 mg/dL   Assessment & Plan:  This visit occurred during the SARS-CoV-2 public health emergency.  Safety protocols were in place, including screening questions prior to the visit, additional usage of staff PPE, and extensive cleaning of exam room while observing appropriate contact time as indicated for disinfecting solutions.   Problem List Items Addressed This Visit    Insomnia    Describes sleep maintenance insomnia worse over last several months likely related to increased work stress that has now improved since he's stopped working at 2nd job. Sleep hygiene measures reviewed. Melatonin no longer effective. Wife's amitriptyline has been effective. Ok to use PRN insomnia. Reviewed common side effects to watch for as well as possible cardiac conduction disturbance side effect. Update with effect. Discussed possible trial trazodone in its place.        Other Visit Diagnoses    Need for influenza vaccination    -  Primary   Relevant Orders   Flu Vaccine QUAD 36+ mos IM (Completed)       Meds ordered this encounter  Medications  . amitriptyline (ELAVIL) 25 MG tablet    Sig: Take 1 tablet (25 mg total) by mouth at bedtime as needed for sleep.    Dispense:  30 tablet    Refill:  3   Orders Placed This Encounter  Procedures  . Flu Vaccine QUAD 36+ mos IM    Patient instructions: Ok to use amitriptyline 10m at night as needed for sleep. Watch for dry mouth and increased appetite side effects.   Sleep hygiene checklist: 1. Avoid naps during the day 2. Avoid stimulants such as caffeine and nicotine. Avoid bedtime alcohol (it can speed onset of sleep but the body's metabolism can cause awakenings). 3. All forms of exercise help ensure sound sleep - limit vigorous exercise to morning or late afternoon 4. Avoid food too close to bedtime including chocolate (which contains caffeine) 5. Soak  up natural light 6. Establish regular bedtime routine. 7. Associate bed with sleep - avoid TV, computer or phone, reading while in bed. 8. Ensure pleasant, relaxing sleep environment - quiet, dark, cool room.   Follow up plan: Return if symptoms worsen or fail to improve.  JRia Bush MD

## 2020-06-04 NOTE — Assessment & Plan Note (Signed)
Describes sleep maintenance insomnia worse over last several months likely related to increased work stress that has now improved since he's stopped working at 2nd job. Sleep hygiene measures reviewed. Melatonin no longer effective. Wife's amitriptyline has been effective. Ok to use PRN insomnia. Reviewed common side effects to watch for as well as possible cardiac conduction disturbance side effect. Update with effect. Discussed possible trial trazodone in its place.

## 2020-06-04 NOTE — Patient Instructions (Signed)
Ok to use amitriptyline 28m at night as needed for sleep. Watch for dry mouth and increased appetite side effects.   Sleep hygiene checklist: 1. Avoid naps during the day 2. Avoid stimulants such as caffeine and nicotine. Avoid bedtime alcohol (it can speed onset of sleep but the body's metabolism can cause awakenings). 3. All forms of exercise help ensure sound sleep - limit vigorous exercise to morning or late afternoon 4. Avoid food too close to bedtime including chocolate (which contains caffeine) 5. Soak up natural light 6. Establish regular bedtime routine. 7. Associate bed with sleep - avoid TV, computer or phone, reading while in bed. 8. Ensure pleasant, relaxing sleep environment - quiet, dark, cool room.   Insomnia Insomnia is a sleep disorder that makes it difficult to fall asleep or stay asleep. Insomnia can cause fatigue, low energy, difficulty concentrating, mood swings, and poor performance at work or school. There are three different ways to classify insomnia:  Difficulty falling asleep.  Difficulty staying asleep.  Waking up too early in the morning. Any type of insomnia can be long-term (chronic) or short-term (acute). Both are common. Short-term insomnia usually lasts for three months or less. Chronic insomnia occurs at least three times a week for longer than three months. What are the causes? Insomnia may be caused by another condition, situation, or substance, such as:  Anxiety.  Certain medicines.  Gastroesophageal reflux disease (GERD) or other gastrointestinal conditions.  Asthma or other breathing conditions.  Restless legs syndrome, sleep apnea, or other sleep disorders.  Chronic pain.  Menopause.  Stroke.  Abuse of alcohol, tobacco, or illegal drugs.  Mental health conditions, such as depression.  Caffeine.  Neurological disorders, such as Alzheimer's disease.  An overactive thyroid (hyperthyroidism). Sometimes, the cause of insomnia may  not be known. What increases the risk? Risk factors for insomnia include:  Gender. Women are affected more often than men.  Age. Insomnia is more common as you get older.  Stress.  Lack of exercise.  Irregular work schedule or working night shifts.  Traveling between different time zones.  Certain medical and mental health conditions. What are the signs or symptoms? If you have insomnia, the main symptom is having trouble falling asleep or having trouble staying asleep. This may lead to other symptoms, such as:  Feeling fatigued or having low energy.  Feeling nervous about going to sleep.  Not feeling rested in the morning.  Having trouble concentrating.  Feeling irritable, anxious, or depressed. How is this diagnosed? This condition may be diagnosed based on:  Your symptoms and medical history. Your health care provider may ask about: ? Your sleep habits. ? Any medical conditions you have. ? Your mental health.  A physical exam. How is this treated? Treatment for insomnia depends on the cause. Treatment may focus on treating an underlying condition that is causing insomnia. Treatment may also include:  Medicines to help you sleep.  Counseling or therapy.  Lifestyle adjustments to help you sleep better. Follow these instructions at home: Eating and drinking  Limit or avoid alcohol, caffeinated beverages, and cigarettes, especially close to bedtime. These can disrupt your sleep.  Do not eat a large meal or eat spicy foods right before bedtime. This can lead to digestive discomfort that can make it hard for you to sleep.   Sleep habits  Keep a sleep diary to help you and your health care provider figure out what could be causing your insomnia. Write down: ? When you sleep. ?  When you wake up during the night. ? How well you sleep. ? How rested you feel the next day. ? Any side effects of medicines you are taking. ? What you eat and drink.  Make your bedroom  a dark, comfortable place where it is easy to fall asleep. ? Put up shades or blackout curtains to block light from outside. ? Use a white noise machine to block noise. ? Keep the temperature cool.  Limit screen use before bedtime. This includes: ? Watching TV. ? Using your smartphone, tablet, or computer.  Stick to a routine that includes going to bed and waking up at the same times every day and night. This can help you fall asleep faster. Consider making a quiet activity, such as reading, part of your nighttime routine.  Try to avoid taking naps during the day so that you sleep better at night.  Get out of bed if you are still awake after 15 minutes of trying to sleep. Keep the lights down, but try reading or doing a quiet activity. When you feel sleepy, go back to bed.   General instructions  Take over-the-counter and prescription medicines only as told by your health care provider.  Exercise regularly, as told by your health care provider. Avoid exercise starting several hours before bedtime.  Use relaxation techniques to manage stress. Ask your health care provider to suggest some techniques that may work well for you. These may include: ? Breathing exercises. ? Routines to release muscle tension. ? Visualizing peaceful scenes.  Make sure that you drive carefully. Avoid driving if you feel very sleepy.  Keep all follow-up visits as told by your health care provider. This is important. Contact a health care provider if:  You are tired throughout the day.  You have trouble in your daily routine due to sleepiness.  You continue to have sleep problems, or your sleep problems get worse. Get help right away if:  You have serious thoughts about hurting yourself or someone else. If you ever feel like you may hurt yourself or others, or have thoughts about taking your own life, get help right away. You can go to your nearest emergency department or call:  Your local emergency  services (911 in the [place]  A suicide crisis helpline, such as the [place] at 857-178-5341. This is open 24 hours a day. Summary  Insomnia is a sleep disorder that makes it difficult to fall asleep or stay asleep.  Insomnia can be long-term (chronic) or short-term (acute).  Treatment for insomnia depends on the cause. Treatment may focus on treating an underlying condition that is causing insomnia.  Keep a sleep diary to help you and your health care provider figure out what could be causing your insomnia. This information is not intended to replace advice given to you by your health care provider. Make sure you discuss any questions you have with your health care provider. Document Revised: 01/26/2020 Document Reviewed: 01/26/2020 Elsevier Patient Education  2021 [employer].

## 2020-06-06 ENCOUNTER — Other Ambulatory Visit: Payer: Self-pay

## 2020-06-06 DIAGNOSIS — K50819 Crohn's disease of both small and large intestine with unspecified complications: Secondary | ICD-10-CM

## 2020-06-07 DIAGNOSIS — K5090 Crohn's disease, unspecified, without complications: Secondary | ICD-10-CM | POA: Diagnosis not present

## 2020-06-19 ENCOUNTER — Other Ambulatory Visit: Payer: Self-pay | Admitting: Interventional Cardiology

## 2020-07-02 DIAGNOSIS — C61 Malignant neoplasm of prostate: Secondary | ICD-10-CM | POA: Diagnosis not present

## 2020-07-06 DIAGNOSIS — K5090 Crohn's disease, unspecified, without complications: Secondary | ICD-10-CM | POA: Diagnosis not present

## 2020-07-09 DIAGNOSIS — C61 Malignant neoplasm of prostate: Secondary | ICD-10-CM | POA: Diagnosis not present

## 2020-07-10 ENCOUNTER — Other Ambulatory Visit: Payer: Self-pay | Admitting: Family Medicine

## 2020-07-14 ENCOUNTER — Other Ambulatory Visit: Payer: Self-pay | Admitting: Family Medicine

## 2020-07-14 DIAGNOSIS — C61 Malignant neoplasm of prostate: Secondary | ICD-10-CM

## 2020-07-14 DIAGNOSIS — N1831 Chronic kidney disease, stage 3a: Secondary | ICD-10-CM

## 2020-07-14 DIAGNOSIS — K50811 Crohn's disease of both small and large intestine with rectal bleeding: Secondary | ICD-10-CM

## 2020-07-14 DIAGNOSIS — M818 Other osteoporosis without current pathological fracture: Secondary | ICD-10-CM

## 2020-07-14 DIAGNOSIS — E785 Hyperlipidemia, unspecified: Secondary | ICD-10-CM

## 2020-07-16 ENCOUNTER — Other Ambulatory Visit: Payer: Self-pay | Admitting: Internal Medicine

## 2020-07-16 ENCOUNTER — Other Ambulatory Visit (INDEPENDENT_AMBULATORY_CARE_PROVIDER_SITE_OTHER): Payer: BC Managed Care – PPO

## 2020-07-16 ENCOUNTER — Other Ambulatory Visit: Payer: Self-pay

## 2020-07-16 DIAGNOSIS — N1831 Chronic kidney disease, stage 3a: Secondary | ICD-10-CM

## 2020-07-16 DIAGNOSIS — M818 Other osteoporosis without current pathological fracture: Secondary | ICD-10-CM | POA: Diagnosis not present

## 2020-07-16 DIAGNOSIS — C61 Malignant neoplasm of prostate: Secondary | ICD-10-CM | POA: Diagnosis not present

## 2020-07-16 DIAGNOSIS — E785 Hyperlipidemia, unspecified: Secondary | ICD-10-CM | POA: Diagnosis not present

## 2020-07-16 LAB — RENAL FUNCTION PANEL
Albumin: 3.7 g/dL (ref 3.5–5.2)
BUN: 25 mg/dL — ABNORMAL HIGH (ref 6–23)
CO2: 29 mEq/L (ref 19–32)
Calcium: 9.2 mg/dL (ref 8.4–10.5)
Chloride: 106 mEq/L (ref 96–112)
Creatinine, Ser: 1.29 mg/dL (ref 0.40–1.50)
GFR: 63.55 mL/min (ref >60.00)
Glucose, Bld: 109 mg/dL — ABNORMAL HIGH (ref 70–99)
Phosphorus: 4.5 mg/dL (ref 2.3–4.6)
Potassium: 4.7 mEq/L (ref 3.5–5.1)
Sodium: 143 mEq/L (ref 135–145)

## 2020-07-16 LAB — PSA: PSA: 6.7 ng/mL — ABNORMAL HIGH (ref 0.10–4.00)

## 2020-07-16 LAB — VITAMIN D 25 HYDROXY (VIT D DEFICIENCY, FRACTURES): VITD: 38.57 ng/mL (ref 30.00–100.00)

## 2020-07-16 LAB — LIPID PANEL
Cholesterol: 105 mg/dL (ref 0–200)
HDL: 35.9 mg/dL — ABNORMAL LOW (ref >39.00)
LDL Cholesterol: 47 mg/dL (ref 0–99)
NonHDL: 69.07
Total CHOL/HDL Ratio: 3
Triglycerides: 110 mg/dL (ref 0.0–149.0)
VLDL: 22 mg/dL (ref 0.0–40.0)

## 2020-07-23 DIAGNOSIS — R0781 Pleurodynia: Secondary | ICD-10-CM | POA: Diagnosis not present

## 2020-07-25 ENCOUNTER — Other Ambulatory Visit: Payer: Self-pay

## 2020-07-25 ENCOUNTER — Ambulatory Visit (INDEPENDENT_AMBULATORY_CARE_PROVIDER_SITE_OTHER): Payer: BC Managed Care – PPO | Admitting: Family Medicine

## 2020-07-25 ENCOUNTER — Encounter: Payer: Self-pay | Admitting: Family Medicine

## 2020-07-25 VITALS — BP 128/80 | HR 64 | Temp 97.8°F | Ht 66.5 in | Wt 172.1 lb

## 2020-07-25 DIAGNOSIS — I10 Essential (primary) hypertension: Secondary | ICD-10-CM | POA: Diagnosis not present

## 2020-07-25 DIAGNOSIS — M301 Polyarteritis with lung involvement [Churg-Strauss]: Secondary | ICD-10-CM

## 2020-07-25 DIAGNOSIS — D7218 Eosinophilia in diseases classified elsewhere: Secondary | ICD-10-CM

## 2020-07-25 DIAGNOSIS — E785 Hyperlipidemia, unspecified: Secondary | ICD-10-CM

## 2020-07-25 DIAGNOSIS — K50811 Crohn's disease of both small and large intestine with rectal bleeding: Secondary | ICD-10-CM

## 2020-07-25 DIAGNOSIS — K200 Eosinophilic esophagitis: Secondary | ICD-10-CM

## 2020-07-25 DIAGNOSIS — D72119 Hypereosinophilic syndrome (hes), unspecified: Secondary | ICD-10-CM | POA: Diagnosis not present

## 2020-07-25 DIAGNOSIS — C61 Malignant neoplasm of prostate: Secondary | ICD-10-CM

## 2020-07-25 DIAGNOSIS — F5104 Psychophysiologic insomnia: Secondary | ICD-10-CM

## 2020-07-25 DIAGNOSIS — Z0000 Encounter for general adult medical examination without abnormal findings: Secondary | ICD-10-CM

## 2020-07-25 DIAGNOSIS — M818 Other osteoporosis without current pathological fracture: Secondary | ICD-10-CM

## 2020-07-25 DIAGNOSIS — N1831 Chronic kidney disease, stage 3a: Secondary | ICD-10-CM

## 2020-07-25 DIAGNOSIS — Z23 Encounter for immunization: Secondary | ICD-10-CM | POA: Diagnosis not present

## 2020-07-25 MED ORDER — TAMSULOSIN HCL 0.4 MG PO CAPS: 0.4000 mg | ORAL_CAPSULE | Freq: Every day | ORAL | 3 refills | Status: DC

## 2020-07-25 NOTE — Assessment & Plan Note (Signed)
Appreciate uro care- pending rpt biopsy

## 2020-07-25 NOTE — Assessment & Plan Note (Signed)
Chronic, stable on current regimen.

## 2020-07-25 NOTE — Assessment & Plan Note (Addendum)
Chronic, stable on atorvastatin, zetia, vascepa. The ASCVD Risk score Mikey Bussing DC Jr., et al., 2013) failed to calculate for the following reasons:   The patient has a prior MI or stroke diagnosis

## 2020-07-25 NOTE — Progress Notes (Signed)
Patient ID: Jack [name], male    DOB: 1965/08/06, 55 y.o.   MRN: [id number]  This visit was conducted in person.  BP 128/80   Pulse 64   Temp 97.8 F (36.6 C) (Temporal)   Ht 5' 6.5" (1.689 m)   Wt 172 lb 2 oz (78.1 kg)   SpO2 100%   BMI 27.37 kg/m    CC: CPE  Subjective:   HPI: Jack [name] is a 55 y.o. male presenting on 07/25/2020 for Annual Exam   Known h/o hypereosinophilic syndrome (churg strauss and EoE), seizure disorder, crohn's disease, and CAD s/p STEMI of LAD 01/2018 s/p DES with resultant ischemic cardiomyopathy ER 35% with LV thrombus s/p coumadin now only on plavix due to GI bleed. He did have lumbar spine schwannoma removed ([place]).   Sees [facility] ENT, and pulmonology. Seeing [place] rheumatology Dossie Der).   Box fell on chest at work - saw [facility], slowly recovering. CXR was unrevealing.   Preventative: COLONOSCOPY during hospitalization 01/2015 - active ileitis/colitis, f/u 2 yrs ([place]). Has established with Dr [name] COLONOSCOPY WITH PROPOFOL 04/09/2018 - inflammatory polyp, f/u left to primary GI - [facility] [name], [name] III, MD) COLONOSCOPY 11/2018 multiple polyps, 1 TA, rpt 2 yrs ([facility]) Prostate cancer - sees uro [facility] [facility]) q6 mo - pending rpt biopsy  DEXA scan -11/2015 T -2.9 Osteoporosis. Takes calcium/vit D with fosamax.  DEXA 06/2017 - T -3.0 femoral neck [facility]).  DEXA 08/2019 - records requested  Lung cancer screen - not eligible  Flu shotyearly COVID vaccine - Pfizer 05/2019, 06/2019, booster 02/2020 Tetanus shot -unsure Pneumovax 2017, 07/2018, prevnar-13 12/2017 shingrix - discussed - start today  Seat belt use discussed Sunscreen usediscussed. No changing moles on skin.Wants to see derm, brother with h/o skin cancer. Requests referral.  Non smoker. Rare cigar. Alcohol - occasionally  Dentist q6 mo Eye exam as needed  Lives with wife, 1 dog. Grown children  Edu: college  Occ: owns mattress store  Activity:treadmill 5d/wk 30  min Diet: good water, fruits/vegetables daily     Relevant past medical, surgical, family and social history reviewed and updated as indicated. Interim medical history since our last visit reviewed. Allergies and medications reviewed and updated. Outpatient Medications Prior to Visit  Medication Sig Dispense Refill  . acetaminophen (TYLENOL) 325 MG tablet Take 650 mg by mouth every 6 (six) hours as needed for mild pain or headache.     . alendronate (FOSAMAX) 70 MG tablet Take 70 mg by mouth every Saturday.   11  . amitriptyline (ELAVIL) 25 MG tablet Take 1 tablet (25 mg total) by mouth at bedtime as needed for sleep. 30 tablet 3  . atorvastatin (LIPITOR) 80 MG tablet TAKE 1 TABLET (80 MG TOTAL) BY MOUTH DAILY AT 6 PM. 90 tablet 3  . azaTHIOprine (IMURAN) 50 MG tablet TAKE 3 TABLETS (150 MG TOTAL) BY MOUTH DAILY 270 tablet 0  . Calcium Carbonate-Vitamin D3 (CALCIUM 600/VITAMIN D) 600-400 MG-UNIT TABS Take 1 tablet by mouth daily.    . carvedilol (COREG) 6.25 MG tablet TAKE 1 TABLET BY MOUTH TWICE A DAY 180 tablet 3  . clopidogrel (PLAVIX) 75 MG tablet TAKE 1 TABLET BY MOUTH EVERY DAY 90 tablet 3  . diphenoxylate-atropine (LOMOTIL) 2.5-0.025 MG tablet TAKE 1-2 TABLETS BY MOUTH 4 TIMES A DAY AS NEEDED FOR DIARRHEA 90 tablet 2  . ENTRESTO 24-26 MG TAKE 1 TABLET BY MOUTH 2 (TWO) TIMES DAILY. 180 tablet 3  . Iron, Ferrous  Sulfate, 325 (65 Fe) MG TABS Take 1 tablet by mouth daily.    . meloxicam (MOBIC) 7.5 MG tablet Take 7.5 mg by mouth daily.    . Multiple Vitamin (MULTIVITAMIN) capsule Take 1 capsule by mouth daily.    . nitroGLYCERIN (NITROSTAT) 0.4 MG SL tablet Place 1 tablet (0.4 mg total) under the tongue every 5 (five) minutes as needed for chest pain. 25 tablet 3  . pantoprazole (PROTONIX) 40 MG tablet TAKE 1 TABLET BY MOUTH EVERY DAY 90 tablet 0  . predniSONE (DELTASONE) 10 MG tablet Take as directed (Currently taking 21 mg daily- decrease by 1 mg as tolerated) 100 tablet 0  . predniSONE  (DELTASONE) 5 MG tablet Take as directed. (currently on 21 mg daily- decrease by 1 mg as tolerated) 100 tablet 0  . spironolactone (ALDACTONE) 25 MG tablet Take a half tablet (12.36m total) by mouth once daily on Monday and Thursday. 45 tablet 3  . tadalafil (CIALIS) 10 MG tablet Take 1 tablet (10 mg total) by mouth daily as needed for erectile dysfunction. 20 tablet 3  . VASCEPA 1 g capsule TAKE 2 CAPSULES (2 G TOTAL) BY MOUTH 2 (TWO) TIMES DAILY. 360 capsule 3  . vedolizumab (ENTYVIO) 300 MG injection Inject 300 mg into the vein. Every 4 weeks    . doxycycline (VIBRA-TABS) 100 MG tablet Take 1 tablet (100 mg total) by mouth 2 (two) times daily. 10 tablet 0  . guaiFENesin-codeine (CHERATUSSIN AC) 100-10 MG/5ML syrup Take 5 mLs by mouth 2 (two) times daily as needed for cough (sedation precautions). 120 mL 0  . predniSONE (DELTASONE) 1 MG tablet Take as directed (currently on 21 mg daily- decrease by 1 mg as tolerated) 100 tablet 0  . tamsulosin (FLOMAX) 0.4 MG CAPS capsule TAKE 1 CAPSULE BY MOUTH EVERY DAY 90 capsule 0  . ezetimibe (ZETIA) 10 MG tablet Take 1 tablet (10 mg total) by mouth daily. 90 tablet 3   Facility-Administered Medications Prior to Visit  Medication Dose Route Frequency Provider Last Rate Last Admin  . ondansetron (ZOFRAN) 4 mg in sodium chloride 0.9 % 50 mL IVPB  4 mg Intravenous Q6H PRN EKristeen Miss MD         Per HPI unless specifically indicated in ROS section below Review of Systems  Constitutional: Negative for activity change, appetite change, chills, fatigue, fever and unexpected weight change.  HENT: Negative for hearing loss.   Eyes: Negative for visual disturbance.  Respiratory: Negative for cough, chest tightness, shortness of breath and wheezing.   Cardiovascular: Negative for chest pain, palpitations and leg swelling.  Gastrointestinal: Positive for constipation. Negative for abdominal distention, abdominal pain, blood in stool, diarrhea, nausea and  vomiting.  Genitourinary: Negative for difficulty urinating and hematuria.  Musculoskeletal: Negative for arthralgias, myalgias and neck pain.  Skin: Negative for rash.  Neurological: Negative for dizziness, seizures, syncope and headaches.  Hematological: Negative for adenopathy. Bruises/bleeds easily.  Psychiatric/Behavioral: Negative for dysphoric mood. The patient is not nervous/anxious.    Objective:  BP 128/80   Pulse 64   Temp 97.8 F (36.6 C) (Temporal)   Ht 5' 6.5" (1.689 m)   Wt 172 lb 2 oz (78.1 kg)   SpO2 100%   BMI 27.37 kg/m   Wt Readings from Last 3 Encounters:  07/25/20 172 lb 2 oz (78.1 kg)  06/04/20 172 lb 6 oz (78.2 kg)  04/23/20 177 lb 3.2 oz (80.4 kg)      Physical Exam Vitals and nursing note  reviewed.  Constitutional:      General: He is not in acute distress.    Appearance: Normal appearance. He is well-developed. He is not ill-appearing.  HENT:     Head: Normocephalic and atraumatic.     Right Ear: Hearing, tympanic membrane, ear canal and external ear normal.     Left Ear: Hearing, tympanic membrane, ear canal and external ear normal.  Eyes:     General: No scleral icterus.    Extraocular Movements: Extraocular movements intact.     Conjunctiva/sclera: Conjunctivae normal.     Pupils: Pupils are equal, round, and reactive to light.  Neck:     Thyroid: No thyroid mass or thyromegaly.  Cardiovascular:     Rate and Rhythm: Normal rate and regular rhythm.     Pulses: Normal pulses.          Radial pulses are 2+ on the right side and 2+ on the left side.     Heart sounds: Normal heart sounds. No murmur heard.   Pulmonary:     Effort: Pulmonary effort is normal. No respiratory distress.     Breath sounds: Normal breath sounds. No wheezing, rhonchi or rales.  Abdominal:     General: Bowel sounds are normal. There is no distension.     Palpations: Abdomen is soft. There is no mass.     Tenderness: There is no abdominal tenderness. There is no  guarding or rebound.     Hernia: No hernia is present.  Musculoskeletal:        General: Normal range of motion.     Cervical back: Normal range of motion and neck supple.     Right lower leg: No edema.     Left lower leg: No edema.  Lymphadenopathy:     Cervical: No cervical adenopathy.  Skin:    General: Skin is warm and dry.     Findings: No rash.  Neurological:     General: No focal deficit present.     Mental Status: He is alert and oriented to person, place, and time.     Comments: CN grossly intact, station and gait intact  Psychiatric:        Mood and Affect: Mood normal.        Behavior: Behavior normal.        Thought Content: Thought content normal.        Judgment: Judgment normal.       Results for orders placed or performed in visit on 07/16/20  PSA  Result Value Ref Range   PSA 6.70 (H) 0.10 - 4.00 ng/mL  VITAMIN D 25 Hydroxy (Vit-D Deficiency, Fractures)  Result Value Ref Range   VITD 38.57 30.00 - 100.00 ng/mL  Lipid panel  Result Value Ref Range   Cholesterol 105 0 - 200 mg/dL   Triglycerides 110.0 0.0 - 149.0 mg/dL   HDL 35.90 (L) >39.00 mg/dL   VLDL 22.0 0.0 - 40.0 mg/dL   LDL Cholesterol 47 0 - 99 mg/dL   Total CHOL/HDL Ratio 3    NonHDL 69.07   Renal function panel  Result Value Ref Range   Sodium 143 135 - 145 mEq/L   Potassium 4.7 3.5 - 5.1 mEq/L   Chloride 106 96 - 112 mEq/L   CO2 29 19 - 32 mEq/L   Albumin 3.7 3.5 - 5.2 g/dL   BUN 25 (H) 6 - 23 mg/dL   Creatinine, Ser 1.29 0.40 - 1.50 mg/dL   Glucose, Bld 109 (H)  70 - 99 mg/dL   Phosphorus 4.5 2.3 - 4.6 mg/dL   GFR 63.55 >60.00 mL/min   Calcium 9.2 8.4 - 10.5 mg/dL   Lab Results  Component Value Date   HGBA1C 5.2 02/20/2018    Assessment & Plan:  This visit occurred during the SARS-CoV-2 public health emergency.  Safety protocols were in place, including screening questions prior to the visit, additional usage of staff PPE, and extensive cleaning of exam room while observing  appropriate contact time as indicated for disinfecting solutions.   Problem List Items Addressed This Visit    Crohn disease ([place])    Appreciate GI care ([facility])      Essential hypertension    Chronic, stable on current regimen.       Hypereosinophilic syndrome    Now followed by [facility] rheum Dossie Der) On Entyvio.       Eosinophilic esophagitis   Osteoporosis    Continues calcium, vit D, fosamax. This is followed by rheum - UTD on DEXA - will request lates records.       Healthcare maintenance - Primary    Preventative protocols reviewed and updated unless pt declined. Discussed healthy diet and lifestyle.       Churg-Strauss syndrome with lung involvement (HCC)   Hyperlipidemia    Chronic, stable on atorvastatin, zetia, vascepa. The ASCVD Risk score Mikey Bussing DC Jr., et al., 2013) failed to calculate for the following reasons:   The patient has a prior MI or stroke diagnosis       CKD (chronic kidney disease) stage 3, GFR 30-59 ml/min    Renal function stable.       Prostate cancer ([place])    Appreciate uro care- pending rpt biopsy      Insomnia    Stable period on amitriptyline.        Other Visit Diagnoses    Need for shingles vaccine       Relevant Orders   Varicella-zoster vaccine IM (Completed)       Meds ordered this encounter  Medications  . tamsulosin (FLOMAX) 0.4 MG CAPS capsule    Sig: Take 1 capsule (0.4 mg total) by mouth daily.    Dispense:  90 capsule    Refill:  3   Orders Placed This Encounter  Procedures  . Varicella-zoster vaccine IM    Patient instructions: We will request latest DEXA 08/2019 from Dr Audelia Hives office [facility])  Shingrix vaccine today. Return in 2-6 months for nurse visit to complete series.  Return in 1 year for next physical, sooner if needed.  Follow up plan: Return in about 1 year (around 07/25/2021) for annual exam, prior fasting for blood work.  [name], MD

## 2020-07-25 NOTE — Assessment & Plan Note (Signed)
Continues calcium, vit D, fosamax. This is followed by rheum - UTD on DEXA - will request lates records.

## 2020-07-25 NOTE — Assessment & Plan Note (Signed)
Renal function stable.

## 2020-07-25 NOTE — Assessment & Plan Note (Signed)
Stable period on amitriptyline.

## 2020-07-25 NOTE — Assessment & Plan Note (Signed)
Appreciate GI care ([facility])

## 2020-07-25 NOTE — Assessment & Plan Note (Addendum)
Now followed by [facility] rheum Dossie Der) On Entyvio.

## 2020-07-25 NOTE — Patient Instructions (Addendum)
We will request latest DEXA 08/2019 from Dr Audelia Hives office [facility])  Shingrix vaccine today. Return in 2-6 months for nurse visit to complete series.  Return in 1 year for next physical, sooner if needed.  Health Maintenance, Male Adopting a healthy lifestyle and getting preventive care are important in promoting health and wellness. Ask your health care provider about:  The right schedule for you to have regular tests and exams.  Things you can do on your own to prevent diseases and keep yourself healthy. What should I know about diet, weight, and exercise? Eat a healthy diet  Eat a diet that includes plenty of vegetables, fruits, low-fat dairy products, and lean protein.  Do not eat a lot of foods that are high in solid fats, added sugars, or sodium.   Maintain a healthy weight Body mass index (BMI) is a measurement that can be used to identify possible weight problems. It estimates body fat based on height and weight. Your health care provider can help determine your BMI and help you achieve or maintain a healthy weight. Get regular exercise Get regular exercise. This is one of the most important things you can do for your health. Most adults should:  Exercise for at least 150 minutes each week. The exercise should increase your heart rate and make you sweat (moderate-intensity exercise).  Do strengthening exercises at least twice a week. This is in addition to the moderate-intensity exercise.  Spend less time sitting. Even light physical activity can be beneficial. Watch cholesterol and blood lipids Have your blood tested for lipids and cholesterol at 53 years of age, then have this test every 5 years. You may need to have your cholesterol levels checked more often if:  Your lipid or cholesterol levels are high.  You are older than 53 years of age.  You are at high risk for heart disease. What should I know about cancer screening? Many types of cancers can  be detected early and may often be prevented. Depending on your health history and family history, you may need to have cancer screening at various ages. This may include screening for:  Colorectal cancer.  Prostate cancer.  Skin cancer.  Lung cancer. What should I know about heart disease, diabetes, and high blood pressure? Blood pressure and heart disease  High blood pressure causes heart disease and increases the risk of stroke. This is more likely to develop in people who have high blood pressure readings, are of African descent, or are overweight.  Talk with your health care provider about your target blood pressure readings.  Have your blood pressure checked: ? Every 3-5 years if you are 53-55 years of age. ? Every year if you are 11 years old or older.  If you are between the ages of 2 and 83 and are a current or former smoker, ask your health care provider if you should have a one-time screening for abdominal aortic aneurysm (AAA). Diabetes Have regular diabetes screenings. This checks your fasting blood sugar level. Have the screening done:  Once every three years after age 19 if you are at a normal weight and have a low risk for diabetes.  More often and at a younger age if you are overweight or have a high risk for diabetes. What should I know about preventing infection? Hepatitis B If you have a higher risk for hepatitis B, you should be screened for this virus. Talk with your health care provider to find out if you are  at risk for hepatitis B infection. Hepatitis C Blood testing is recommended for:  Everyone born from [birth year] through 1963.  Anyone with known risk factors for hepatitis C. Sexually transmitted infections (STIs)  You should be screened each year for STIs, including gonorrhea and chlamydia, if: ? You are sexually active and are younger than 53 years of age. ? You are older than 53 years of age and your health care provider tells you that you are at risk  for this type of infection. ? Your sexual activity has changed since you were last screened, and you are at increased risk for chlamydia or gonorrhea. Ask your health care provider if you are at risk.  Ask your health care provider about whether you are at high risk for HIV. Your health care provider may recommend a prescription medicine to help prevent HIV infection. If you choose to take medicine to prevent HIV, you should first get tested for HIV. You should then be tested every 3 months for as long as you are taking the medicine. Follow these instructions at home: Lifestyle  Do not use any products that contain nicotine or tobacco, such as cigarettes, e-cigarettes, and chewing tobacco. If you need help quitting, ask your health care provider.  Do not use street drugs.  Do not share needles.  Ask your health care provider for help if you need support or information about quitting drugs. Alcohol use  Do not drink alcohol if your health care provider tells you not to drink.  If you drink alcohol: ? Limit how much you have to 0-2 drinks a day. ? Be aware of how much alcohol is in your drink. In the [place] one drink equals one 12 oz bottle of beer (355 mL), one 5 oz glass of wine (148 mL), or one 1 oz glass of hard liquor (44 mL). General instructions  Schedule regular health, dental, and eye exams.  Stay current with your vaccines.  Tell your health care provider if: ? You often feel depressed. ? You have ever been abused or do not feel safe at home. Summary  Adopting a healthy lifestyle and getting preventive care are important in promoting health and wellness.  Follow your health care provider's instructions about healthy diet, exercising, and getting tested or screened for diseases.  Follow your health care provider's instructions on monitoring your cholesterol and blood pressure. This information is not intended to replace advice given to you by your health care provider.  Make sure you discuss any questions you have with your health care provider. Document Revised: 03/10/2018 Document Reviewed: 03/10/2018 Elsevier Patient Education  2021 [employer].

## 2020-07-25 NOTE — Assessment & Plan Note (Signed)
Preventative protocols reviewed and updated unless pt declined. Discussed healthy diet and lifestyle.  

## 2020-08-06 ENCOUNTER — Encounter: Payer: Self-pay | Admitting: Family Medicine

## 2020-08-06 ENCOUNTER — Telehealth (INDEPENDENT_AMBULATORY_CARE_PROVIDER_SITE_OTHER): Payer: BC Managed Care – PPO | Admitting: Family Medicine

## 2020-08-06 ENCOUNTER — Other Ambulatory Visit: Payer: Self-pay

## 2020-08-06 VITALS — Wt 165.0 lb

## 2020-08-06 DIAGNOSIS — U071 COVID-19: Secondary | ICD-10-CM

## 2020-08-06 NOTE — Progress Notes (Signed)
Patient: Jack [name] MRN: [id number] DOB: 1966-02-07 PCP: [name], MD     I connected with Sadarius Norman Hoque on 08/06/20 at 11:06am by a video enabled telemedicine application and verified that I am speaking with the correct person using two identifiers.  Location patient: Home Location provider: [facility], Office Persons participating in this virtual visit: [place], [place]ising[place] and Dr. Rogers Blocker   I discussed the limitations of evaluation and management by telemedicine and the availability of in person appointments. The patient expressed understanding and agreed to proceed.   Subjective:  Chief Complaint  Patient presents with  . Covid Positive    Patient states he tested positive for covid-19 on Saturday May 7th 2022. He states he was experiencing some sneezing, runny nose , and some coughing but the symptoms has went away. He is not currently taking any medications for these symptoms.    HPI: The patient is a 55 y.o. male who presents today for covid 19 infection. 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 Friday night he started to have a runny nose and congestion. Saturday Am he woke up with fatigue and was scheduled for a covid test. He did this and it was positive. He went back to bed and woke up in the afternoon and felt fine. He states he feels back to normal besides some fatigue. He has a mild cough that is productive. He has had no coughing since yesterday.  No recorded fever/chills. No headache. No shortness of breath or wheezing. He is taking all vitamins (zinc, D3, vit. C, melatonin, ASA only x 2 doses quercetin). He is also on elavil.   Recently went to the DR and flew home. They just ordered a pulse ox as well and it will be there tomorrow. He states he feels fine, just tired. Denies any lower leg swelling in his calves,  tenderness.    He has been vaccinated and boosted x 1.   Review of Systems  Constitutional: Positive for fatigue. Negative for chills, diaphoresis and fever.  HENT: Negative for congestion, sinus pressure, sinus pain and sore throat.   Eyes: Negative for visual disturbance.  Respiratory: Negative for cough, shortness of breath and wheezing.   Cardiovascular: Negative for chest pain, palpitations and leg swelling.  Gastrointestinal: Negative for abdominal pain, diarrhea, nausea and vomiting.  Skin: Negative for rash.    Allergies Patient is allergic to cephalexin and duloxetine.  Past Medical History Patient  has a past medical history of Acquired renal cyst of right kidney (08/31/2018), Allergy, BPH (benign prostatic hypertrophy), CAD (coronary artery disease), Chronic systolic (congestive) heart failure ([place]), Colitis, Colon polyp, Crohn disease ([place]) (1992), Diverticulosis, Eosinophilic granuloma ([place]), Essential hypertension, GERD (gastroesophageal reflux disease), GI bleeding, History of chicken pox, History of gastroesophageal reflux (GERD), History of seizure (1995), Hyperlipidemia, Internal hemorrhoids, Ischemic cardiomyopathy, LV (left ventricular) mural thrombus following MI ([place]) (01/2018), Myocardial infarction [facility]), Osteoporosis (11/2015), Schwannoma (2007), Seizures ([place]), and Ulnar neuropathy.  Surgical History Patient  has a past surgical history that includes Cholecystectomy (2000); Appendectomy (2000); Tonsillectomy (1996); Tumor removal (Left, 2007); Spinal cord stimulator implant (2012); extremity surgery (Left); Lumbar spine surgery (2011); Colonoscopy (08/2014); Inguinal hernia repair (Bilateral, 2017); Esophagogastroduodenoscopy (07/2014); Cardiovascular stress test (01/2015); Upper gastrointestinal endoscopy; CORONARY ANGIOGRAPHY (N/A, 02/21/2018); Coronary/Graft Acute MI Revascularization (N/A, 02/20/2018); LEFT HEART CATH AND CORONARY ANGIOGRAPHY (N/A, 02/20/2018);  Colonoscopy  with propofol (N/A, 04/09/2018); submucosal injection (04/09/2018); polypectomy (04/09/2018); Flexible sigmoidoscopy (N/A, 04/14/2018); Cardiac catheterization; Back surgery (2011); Laminectomy (N/A, 08/09/2018); Colonoscopy (11/2018); and Esophagogastroduodenoscopy (11/2018).  Family History Pateint's family history includes CAD (age of onset: 75) in his father and mother; Colon cancer in his maternal grandmother; Congenital heart disease in his mother; Crohn's disease in his brother; Hyperlipidemia in his father and mother; Hypertension in his father and mother.  Social History Patient  reports that he quit smoking about 2 years ago. His smoking use included cigars. He has never used smokeless tobacco. He reports current alcohol use. He reports that he does not use drugs.    Objective: Vitals:   08/06/20 1119  Weight: 165 lb (74.8 kg)    Body mass index is 26.23 kg/m.  Physical Exam Vitals reviewed.  Constitutional:      Appearance: Normal appearance. He is normal weight.  HENT:     Head: Normocephalic and atraumatic.     Nose: No congestion.  Pulmonary:     Effort: Pulmonary effort is normal.     Comments: Very comfortable and talking with ease. No work of breathing. Full complete sentences.  Neurological:     General: No focal deficit present.     Mental Status: He is alert and oriented to person, place, and time.  Psychiatric:        Mood and Affect: Mood normal.        Behavior: Behavior normal.        Assessment/plan: 1. COVID-19 -day 4 of illness -discussed infusion vs. New oral medications. He states he feels fine. Wouldn't even know he was sick except for fatigue and would like to defer. He is high risk. Discussed if he has any worsening symptoms I would recommend we do monoclonal infusion. He will let me know.  -starting him on treatment nutraceutical bundle including zinc sulfate, vit D, vit C, quercetin and melatonin 5-15+ days depending on symptoms. He  has already started this. Discussed no more aspirin since already on plavix.  -ivermectin X5 days. Discussed with family that this is still considered experimental; however, multiple studies (over 27 at this point) have been done that have shown significant benefit with it's use and it's safety profile can not be beat. Side effects discussed. They would like to proceed.  -gargle mouthwash TID -outside as much as possible.  -pulse ox >93-94% -if any pulmonary symptoms discussed addition of pulmicort and steroids if needed. Already on elavil so would hold ssri.  -precautions given.     Return if symptoms worsen or fail to improve.    Orma Flaming, MD [place]  08/06/2020

## 2020-08-11 ENCOUNTER — Other Ambulatory Visit: Payer: Self-pay | Admitting: Interventional Cardiology

## 2020-08-14 DIAGNOSIS — K5090 Crohn's disease, unspecified, without complications: Secondary | ICD-10-CM | POA: Diagnosis not present

## 2020-08-21 ENCOUNTER — Ambulatory Visit (INDEPENDENT_AMBULATORY_CARE_PROVIDER_SITE_OTHER): Payer: BC Managed Care – PPO | Admitting: Internal Medicine

## 2020-08-21 ENCOUNTER — Encounter: Payer: Self-pay | Admitting: Internal Medicine

## 2020-08-21 VITALS — BP 120/74 | HR 59 | Ht 67.0 in | Wt 171.0 lb

## 2020-08-21 DIAGNOSIS — K2950 Unspecified chronic gastritis without bleeding: Secondary | ICD-10-CM | POA: Diagnosis not present

## 2020-08-21 DIAGNOSIS — K50819 Crohn's disease of both small and large intestine with unspecified complications: Secondary | ICD-10-CM

## 2020-08-21 DIAGNOSIS — Z79899 Other long term (current) drug therapy: Secondary | ICD-10-CM

## 2020-08-21 DIAGNOSIS — Z86010 Personal history of colonic polyps: Secondary | ICD-10-CM

## 2020-08-21 DIAGNOSIS — D500 Iron deficiency anemia secondary to blood loss (chronic): Secondary | ICD-10-CM | POA: Diagnosis not present

## 2020-08-21 NOTE — Patient Instructions (Addendum)
It has been recommended to you by your physician that you have a(n) colonoscopy completed in late August/early September. Per your request, we did not schedule the procedure(s) today. Please contact our office at [phone] when you decide to have the procedure completed. You will be scheduled for a pre-visit and procedure at that time.  Continue Entyvio.  Continue Azathioprine.  Continue pantoprazole.  If you are age 53 or younger, your body mass index should be between 19-25. Your Body mass index is 26.78 kg/m. If this is out of the aformentioned range listed, please consider follow up with your Primary Care Provider.   Due to recent changes in healthcare laws, you may see the results of your imaging and laboratory studies on MyChart before your provider has had a chance to review them.  We understand that in some cases there may be results that are confusing or concerning to you. Not all laboratory results come back in the same time frame and the provider may be waiting for multiple results in order to interpret others.  Please give [place] 48 hours in order for your provider to thoroughly review all the results before contacting the office for clarification of your results.

## 2020-08-21 NOTE — Progress Notes (Signed)
Subjective:    Patient ID: Jack [name], male    DOB: 05/01/1965, 55 y.o.   MRN: [id number]  HPI Jack [name] is a 55 year old male with a longstanding history of ileocolonic Crohn's disease, adenomatous colon polyps, chronic eosinophilic pneumonitis/vasculitis on chronic prednisone, history of CAD with PCI in November 2019 on Plavix, who is here for follow-up.  He is here alone today and was last seen in June 2021.  He reports that he currently is doing well.  He continues Entyvio infusions every [redacted] weeks along with azathioprine 150 mg daily.  Azathioprine was added back last year.  He missed Entyvio about 6 weeks ago and was about a week late on his dose.  With this he had return of loose stools which was very noticeable and improved after his infusion.  Currently he is not having diarrhea and is not needing Lomotil.  No blood in stool or melena.  No abdominal pain.  He continues pantoprazole 40 mg daily with his history of gastritis and chronic prednisone use.  Appetite is good.  No nausea or vomiting.  No trouble swallowing.  He is sleeping well.  He has followed up with rheumatology, Dr. Dossie Der, for his eosinophilic pneumonitis.  He is weaned his prednisone down to 15 mg daily and has plans of changing prednisone to 15 mg alternating every other day with 14 mg.  Goal continues to be to further decrease prednisone dose but in the past as it has decreased he has had a flare of his pneumonitis.  Currently lung symptoms seem and appears controlled to him.  No cough, shortness of breath or chest pain today.   Review of Systems As per HPI, otherwise negative  Current Medications, Allergies, Past Medical History, Past Surgical History, Family History and Social History were reviewed in [employer] record.     Objective:   Physical Exam BP 120/74   Pulse (!) 59   Ht 5' 7"  (1.702 m)   Wt 171 lb (77.6 kg)   BMI 26.78 kg/m  Gen: awake, alert, NAD HEENT: anicteric   CV: RRR, no mrg Pulm: CTA b/l Abd: soft, NT/ND, +BS throughout Ext: no c/c/e Neuro: nonfocal  CBC    Component Value Date/Time   WBC 11.0 (H) 06/04/2020 1603   RBC 4.60 06/04/2020 1603   HGB 13.3 06/04/2020 1603   HGB 14.1 11/22/2018 0958   HCT 41.5 06/04/2020 1603   HCT 44.7 11/22/2018 0958   PLT 292.0 06/04/2020 1603   PLT 302 11/22/2018 0958   MCV 90.2 06/04/2020 1603   MCV 84 11/22/2018 0958   MCH 26.6 11/22/2018 0958   MCH 24.1 (L) 08/02/2018 1048   MCHC 32.2 06/04/2020 1603   RDW 14.5 06/04/2020 1603   RDW 15.1 11/22/2018 0958   LYMPHSABS 1.3 06/04/2020 1603   MONOABS 1.0 06/04/2020 1603   EOSABS 0.1 06/04/2020 1603   BASOSABS 0.1 06/04/2020 1603   CMP     Component Value Date/Time   NA 143 07/16/2020 0844   NA 141 01/27/2020 0941   K 4.7 07/16/2020 0844   CL 106 07/16/2020 0844   CO2 29 07/16/2020 0844   GLUCOSE 109 (H) 07/16/2020 0844   BUN 25 (H) 07/16/2020 0844   BUN 16 01/27/2020 0941   CREATININE 1.29 07/16/2020 0844   CALCIUM 9.2 07/16/2020 0844   PROT 6.7 06/04/2020 1603   PROT 6.8 11/22/2018 0958   ALBUMIN 3.7 07/16/2020 0844   ALBUMIN 4.0 11/22/2018 0958   AST  25 06/04/2020 1603   ALT 35 06/04/2020 1603   ALKPHOS 37 (L) 06/04/2020 1603   BILITOT 0.8 06/04/2020 1603   BILITOT 0.7 11/22/2018 0958   GFRNONAA 76 01/27/2020 0941   GFRAA 88 01/27/2020 0941       Assessment & Plan:  55 year old male with a longstanding history of ileocolonic Crohn's disease, adenomatous colon polyps, chronic eosinophilic pneumonitis/vasculitis on chronic prednisone, history of CAD with PCI in November 2019 on Plavix, who is here for follow-up.   1.  Crohn's ileocolitis --currently symptoms have come under control with clinical remission after adding azathioprine back to Entyvio 300 mg every 4 weeks.  He has not had diarrhea, rectal bleeding or abdominal pain. -- Continue Entyvio 300 mg every 4 weeks; [facility] -- Continue azathioprine  150 mg daily; no evidence of leukopenia or hepatitis at current dose -- Surveillance colonoscopy recommended in August or September of this year -- He is weaning prednisone but this is not being used for his Crohn's disease but rather for his eosinophilic pneumonitis  2.  History of gastritis --continue pantoprazole 40 mg daily for gastric protection particularly in the setting of chronic steroid use.  At last gastric biopsy he does not have H. Pylori.  3.  History of IDA secondary to IBD --current control of IBD and no evidence of anemia.  Monitor over time  4.  Eosinophilic pneumonitis/vasculitis --following with rheumatology and weaning steroids as above.  I will defer steroid taper to rheumatology given that his lung disease is the driving force behind needing corticosteroids

## 2020-09-07 DIAGNOSIS — D075 Carcinoma in situ of prostate: Secondary | ICD-10-CM | POA: Diagnosis not present

## 2020-09-07 DIAGNOSIS — R972 Elevated prostate specific antigen [PSA]: Secondary | ICD-10-CM | POA: Diagnosis not present

## 2020-09-13 DIAGNOSIS — K5090 Crohn's disease, unspecified, without complications: Secondary | ICD-10-CM | POA: Diagnosis not present

## 2020-09-14 ENCOUNTER — Encounter: Payer: Self-pay | Admitting: Family Medicine

## 2020-09-14 DIAGNOSIS — N401 Enlarged prostate with lower urinary tract symptoms: Secondary | ICD-10-CM | POA: Diagnosis not present

## 2020-09-14 DIAGNOSIS — C61 Malignant neoplasm of prostate: Secondary | ICD-10-CM | POA: Diagnosis not present

## 2020-09-14 DIAGNOSIS — R3912 Poor urinary stream: Secondary | ICD-10-CM | POA: Diagnosis not present

## 2020-09-17 NOTE — Telephone Encounter (Signed)
Current rx is 10 mg.  Tadalafil Last rx: 08/22/19, #20/3 Last OV:   07/25/20, CPE Next OV:  07/29/21, CPE

## 2020-09-18 MED ORDER — TADALAFIL 20 MG PO TABS: 10.0000 mg | ORAL_TABLET | Freq: Every day | ORAL | 3 refills | Status: DC | PRN

## 2020-09-18 NOTE — Telephone Encounter (Signed)
ERx 

## 2020-10-11 ENCOUNTER — Other Ambulatory Visit: Payer: Self-pay | Admitting: Internal Medicine

## 2020-10-11 DIAGNOSIS — K5090 Crohn's disease, unspecified, without complications: Secondary | ICD-10-CM | POA: Diagnosis not present

## 2020-10-13 ENCOUNTER — Other Ambulatory Visit: Payer: Self-pay | Admitting: Family Medicine

## 2020-10-15 NOTE — Telephone Encounter (Signed)
Last OV - 4/27/202 Next OV - 5/1/20223 Last Filled - 06/20/20

## 2020-10-17 ENCOUNTER — Telehealth: Payer: Self-pay

## 2020-10-17 NOTE — Telephone Encounter (Signed)
**Note De-Identified  Obfuscation** [place] Key: [id number] Outcome: The member recently filled this medication and will be able to return for their next refill according to their plan limits. Drug: Ezetimibe 10MG tablets Form: Librarian, academic PA Form [phone] NCPDP)  [facility] is aware of this outcome.

## 2020-10-17 NOTE — Telephone Encounter (Signed)
**Note De-Identified  Obfuscation** I started a Ezetimibe PA through [employer]. Key: [id number]

## 2020-10-25 ENCOUNTER — Ambulatory Visit (INDEPENDENT_AMBULATORY_CARE_PROVIDER_SITE_OTHER): Payer: BC Managed Care – PPO

## 2020-10-25 ENCOUNTER — Other Ambulatory Visit: Payer: Self-pay

## 2020-10-25 DIAGNOSIS — Z23 Encounter for immunization: Secondary | ICD-10-CM

## 2020-10-26 ENCOUNTER — Other Ambulatory Visit: Payer: Self-pay | Admitting: Internal Medicine

## 2020-11-02 ENCOUNTER — Other Ambulatory Visit: Payer: Self-pay | Admitting: Family Medicine

## 2020-11-08 DIAGNOSIS — K5090 Crohn's disease, unspecified, without complications: Secondary | ICD-10-CM | POA: Diagnosis not present

## 2020-11-10 ENCOUNTER — Other Ambulatory Visit: Payer: Self-pay | Admitting: Family Medicine

## 2020-11-11 ENCOUNTER — Other Ambulatory Visit: Payer: Self-pay | Admitting: Physician Assistant

## 2020-11-12 NOTE — Telephone Encounter (Signed)
Last refilled on 10/17/20 for 30 day supply at a time, Receiving request from pharmacy to change to 90 day supply with refills. Please review. Thank you

## 2020-12-07 DIAGNOSIS — K5090 Crohn's disease, unspecified, without complications: Secondary | ICD-10-CM | POA: Diagnosis not present

## 2020-12-21 DIAGNOSIS — R351 Nocturia: Secondary | ICD-10-CM | POA: Diagnosis not present

## 2020-12-21 DIAGNOSIS — N401 Enlarged prostate with lower urinary tract symptoms: Secondary | ICD-10-CM | POA: Diagnosis not present

## 2020-12-21 DIAGNOSIS — C61 Malignant neoplasm of prostate: Secondary | ICD-10-CM | POA: Diagnosis not present

## 2020-12-22 ENCOUNTER — Encounter: Payer: Self-pay | Admitting: Family Medicine

## 2020-12-26 ENCOUNTER — Other Ambulatory Visit: Payer: Self-pay | Admitting: Family Medicine

## 2020-12-26 NOTE — Progress Notes (Signed)
Saw urology [facility] [facility] on finasteride 78m and silodosin 838mdaily.

## 2021-01-04 DIAGNOSIS — K5090 Crohn's disease, unspecified, without complications: Secondary | ICD-10-CM | POA: Diagnosis not present

## 2021-01-29 ENCOUNTER — Other Ambulatory Visit: Payer: Self-pay | Admitting: Internal Medicine

## 2021-02-09 ENCOUNTER — Other Ambulatory Visit: Payer: Self-pay | Admitting: Interventional Cardiology

## 2021-03-02 ENCOUNTER — Other Ambulatory Visit: Payer: Self-pay | Admitting: Interventional Cardiology

## 2021-03-14 DIAGNOSIS — K5090 Crohn's disease, unspecified, without complications: Secondary | ICD-10-CM | POA: Diagnosis not present

## 2021-04-09 ENCOUNTER — Other Ambulatory Visit: Payer: Self-pay | Admitting: Interventional Cardiology

## 2021-04-10 ENCOUNTER — Encounter: Payer: Self-pay | Admitting: Interventional Cardiology

## 2021-04-11 DIAGNOSIS — K5090 Crohn's disease, unspecified, without complications: Secondary | ICD-10-CM | POA: Diagnosis not present

## 2021-04-23 DIAGNOSIS — Z7952 Long term (current) use of systemic steroids: Secondary | ICD-10-CM | POA: Diagnosis not present

## 2021-04-23 DIAGNOSIS — K5090 Crohn's disease, unspecified, without complications: Secondary | ICD-10-CM | POA: Diagnosis not present

## 2021-04-23 DIAGNOSIS — Z79899 Other long term (current) drug therapy: Secondary | ICD-10-CM | POA: Diagnosis not present

## 2021-04-23 DIAGNOSIS — I776 Arteritis, unspecified: Secondary | ICD-10-CM | POA: Diagnosis not present

## 2021-04-27 ENCOUNTER — Emergency Department (HOSPITAL_BASED_OUTPATIENT_CLINIC_OR_DEPARTMENT_OTHER)
Admission: EM | Admit: 2021-04-27 | Discharge: 2021-04-27 | Disposition: A | Discharge status: Home / Self Care | Payer: BC Managed Care – PPO | Attending: Emergency Medicine | Admitting: Emergency Medicine

## 2021-04-27 ENCOUNTER — Other Ambulatory Visit: Payer: Self-pay

## 2021-04-27 ENCOUNTER — Encounter (HOSPITAL_BASED_OUTPATIENT_CLINIC_OR_DEPARTMENT_OTHER): Payer: Self-pay

## 2021-04-27 DIAGNOSIS — E860 Dehydration: Secondary | ICD-10-CM

## 2021-04-27 DIAGNOSIS — R55 Syncope and collapse: Secondary | ICD-10-CM | POA: Diagnosis not present

## 2021-04-27 DIAGNOSIS — Z7901 Long term (current) use of anticoagulants: Secondary | ICD-10-CM | POA: Diagnosis not present

## 2021-04-27 DIAGNOSIS — Z79899 Other long term (current) drug therapy: Secondary | ICD-10-CM | POA: Insufficient documentation

## 2021-04-27 DIAGNOSIS — R42 Dizziness and giddiness: Secondary | ICD-10-CM | POA: Diagnosis not present

## 2021-04-27 LAB — CBG MONITORING, ED: Glucose-Capillary: 114 mg/dL — ABNORMAL HIGH (ref 70–99)

## 2021-04-27 LAB — URINALYSIS, ROUTINE W REFLEX MICROSCOPIC
Bilirubin Urine: NEGATIVE
Glucose, UA: NEGATIVE mg/dL
Ketones, ur: NEGATIVE mg/dL
Leukocytes,Ua: NEGATIVE
Nitrite: NEGATIVE
Protein, ur: NEGATIVE mg/dL
Specific Gravity, Urine: 1.018 (ref 1.005–1.030)
pH: 6.5 (ref 5.0–8.0)

## 2021-04-27 LAB — CBC
HCT: 44.9 % (ref 39.0–52.0)
Hemoglobin: 14.1 g/dL (ref 13.0–17.0)
MCH: 31.2 pg (ref 26.0–34.0)
MCHC: 31.4 g/dL (ref 30.0–36.0)
MCV: 99.3 fL (ref 80.0–100.0)
Platelets: 277 10*3/uL (ref 150–400)
RBC: 4.52 MIL/uL (ref 4.22–5.81)
RDW: 14.1 % (ref 11.5–15.5)
WBC: 12.6 10*3/uL — ABNORMAL HIGH (ref 4.0–10.5)
nRBC: 0 % (ref 0.0–0.2)

## 2021-04-27 LAB — BASIC METABOLIC PANEL
Anion gap: 6 (ref 5–15)
BUN: 22 mg/dL — ABNORMAL HIGH (ref 6–20)
CO2: 33 mmol/L — ABNORMAL HIGH (ref 22–32)
Calcium: 9.5 mg/dL (ref 8.9–10.3)
Chloride: 99 mmol/L (ref 98–111)
Creatinine, Ser: 1.17 mg/dL (ref 0.61–1.24)
GFR, Estimated: >60 mL/min (ref >60)
Glucose, Bld: 138 mg/dL — ABNORMAL HIGH (ref 70–99)
Potassium: 4.5 mmol/L (ref 3.5–5.1)
Sodium: 138 mmol/L (ref 135–145)

## 2021-04-27 MED ORDER — LACTATED RINGERS IV BOLUS
2000.0000 mL | Freq: Once | INTRAVENOUS | Status: AC
  Administered 2021-04-27: 1000 mL via INTRAVENOUS

## 2021-04-27 MED ORDER — LACTATED RINGERS IV SOLN: INTRAVENOUS | Status: DC

## 2021-04-27 NOTE — ED Triage Notes (Signed)
He tells me that at about 0100 hours today he had 3 brief syncopal episodes while up going to the b.r. (urinate). He denies any pain, including chest pain. He c/o "I just don't feel right". He mentions of cardiac stenting, vasculitis and Crohn's dis.

## 2021-04-27 NOTE — ED Provider Notes (Signed)
[facility] DE[facility] Provider Note   CSN: [id number] Arrival date & time: 04/27/21  1356     History  Chief Complaint  Patient presents with   Loss of Consciousness    Jack [name] is a 56 y.o. male.  56 year old male who presents after having syncopal event x3 today.  States that earlier this morning he got up went to the bathroom while going back to his bed felt dizzy and lightheaded.  Had trouble standing up.  Did have a syncopal event.  No postictal period from there.  No trauma from this.  Patient has history of Crohn's disease and has been having copious amounts of watery diarrhea.  No fever or blood in his stool.  No emesis but some nausea noted.  Denies any chest pain or shortness of breath with this.  He is on Plavix but denies any head trauma from his fall.      Home Medications Prior to Admission medications   Medication Sig Start Date End Date Taking? Authorizing Provider  acetaminophen (TYLENOL) 325 MG tablet Take 650 mg by mouth every 6 (six) hours as needed for mild pain or headache.     [provider]  alendronate (FOSAMAX) 70 MG tablet Take 70 mg by mouth every Saturday.  01/11/18   [provider]  amitriptyline (ELAVIL) 25 MG tablet TAKE 1 TABLET (25 MG TOTAL) BY MOUTH AT BEDTIME AS NEEDED FOR SLEEP. 11/13/20   [name], MD  atorvastatin (LIPITOR) 80 MG tablet TAKE 1 TABLET (80 MG TOTAL) BY MOUTH DAILY AT 6 PM. 06/20/20   Belva Crome, MD  azaTHIOprine (IMURAN) 50 MG tablet TAKE 3 TABLETS BY MOUTH EVERY DAY 01/29/21   Pyrtle, Lajuan Lines, MD  Calcium Carbonate-Vitamin D3 (CALCIUM 600/VITAMIN D) 600-400 MG-UNIT TABS Take 1 tablet by mouth daily. 03/02/19   [name], MD  carvedilol (COREG) 6.25 MG tablet TAKE 1 TABLET BY MOUTH TWICE A DAY 03/13/20   Belva Crome, MD  clopidogrel (PLAVIX) 75 MG tablet TAKE 1 TABLET BY MOUTH EVERY DAY 03/12/20   Belva Crome, MD  diphenoxylate-atropine (LOMOTIL) 2.5-0.025 MG  tablet TAKE 1-2 TABLETS BY MOUTH 4 TIMES A DAY AS NEEDED FOR DIARRHEA 09/19/19   Pyrtle, Lajuan Lines, MD  ENTRESTO 24-26 MG TAKE 1 TABLET BY MOUTH 2 (TWO) TIMES DAILY. 02/11/21   Belva Crome, MD  ezetimibe (ZETIA) 10 MG tablet TAKE 1 TABLET BY MOUTH EVERY DAY 11/12/20   Imogene Burn, PA-C  finasteride (PROSCAR) 5 MG tablet Take 1 tablet (5 mg total) by mouth daily. 12/26/20   [name], MD  icosapent Ethyl (VASCEPA) 1 g capsule TAKE 2 CAPSULES (2 G TOTAL) BY MOUTH 2 (TWO) TIMES DAILY. 08/13/20   Belva Crome, MD  Iron, Ferrous Sulfate, 325 (65 Fe) MG TABS Take 1 tablet by mouth daily.    [provider]  meloxicam (MOBIC) 7.5 MG tablet Take 7.5 mg by mouth daily.    [provider]  Multiple Vitamin (MULTIVITAMIN) capsule Take 1 capsule by mouth daily.    [provider]  nitroGLYCERIN (NITROSTAT) 0.4 MG SL tablet Place 1 tablet (0.4 mg total) under the tongue every 5 (five) minutes as needed for chest pain. 10/11/19   Belva Crome, MD  pantoprazole (PROTONIX) 40 MG tablet TAKE 1 TABLET BY MOUTH EVERY DAY 11/02/20   [name], MD  predniSONE (DELTASONE) 10 MG tablet Take as directed (Currently taking 21 mg daily- decrease by 1 mg  as tolerated) 09/19/19   Pyrtle, Lajuan Lines, MD  predniSONE (DELTASONE) 5 MG tablet Take as directed. (currently on 21 mg daily- decrease by 1 mg as tolerated) 09/19/19   Pyrtle, Lajuan Lines, MD  silodosin (RAPAFLO) 8 MG CAPS capsule Take 1 capsule (8 mg total) by mouth daily with breakfast. 12/26/20   [name], MD  spironolactone (ALDACTONE) 25 MG tablet TAKE A HALF TABLET (12.5MG TOTAL) BY MOUTH ONCE DAILY ON MONDAY AND THURSDAY. 04/09/21   Belva Crome, MD  tadalafil (CIALIS) 20 MG tablet Take 0.5-1 tablets (10-20 mg total) by mouth daily as needed for erectile dysfunction. 09/18/20   [name], MD  vedolizumab (ENTYVIO) 300 MG injection Inject 300 mg into the vein. Every 4 weeks    [provider]      Allergies     Cephalexin and Duloxetine    Review of Systems   Review of Systems  All other systems reviewed and are negative.  Physical Exam Updated Vital Signs BP 133/82 (BP Location: Right Arm)    Pulse (!) 51    Temp 98.1 F (36.7 C) (Oral)    Resp 18    Ht 1.702 m (5' 7" )    Wt 74.8 kg Comment: stated per pt   SpO2 100%    BMI 25.84 kg/m  Physical Exam Vitals and nursing note reviewed.  Constitutional:      General: He is not in acute distress.    Appearance: Normal appearance. He is well-developed. He is not toxic-appearing.  HENT:     Head: Normocephalic and atraumatic.  Eyes:     General: Lids are normal.     Conjunctiva/sclera: Conjunctivae normal.     Pupils: Pupils are equal, round, and reactive to light.  Neck:     Thyroid: No thyroid mass.     Trachea: No tracheal deviation.  Cardiovascular:     Rate and Rhythm: Normal rate and regular rhythm.     Heart sounds: Normal heart sounds. No murmur heard.   No gallop.  Pulmonary:     Effort: Pulmonary effort is normal. No respiratory distress.     Breath sounds: Normal breath sounds. No stridor. No decreased breath sounds, wheezing, rhonchi or rales.  Abdominal:     General: There is no distension.     Palpations: Abdomen is soft.     Tenderness: There is no abdominal tenderness. There is no rebound.  Musculoskeletal:        General: No tenderness. Normal range of motion.     Cervical back: Normal range of motion and neck supple.  Skin:    General: Skin is warm and dry.     Findings: No abrasion or rash.  Neurological:     Mental Status: He is alert and oriented to person, place, and time. Mental status is at baseline.     GCS: GCS eye subscore is 4. GCS verbal subscore is 5. GCS motor subscore is 6.     Cranial Nerves: No cranial nerve deficit.     Sensory: No sensory deficit.     Motor: Motor function is intact.  Psychiatric:        Attention and Perception: Attention normal.        Speech: Speech normal.         Behavior: Behavior normal.    ED Results / Procedures / Treatments   Labs (all labs ordered are listed, but only abnormal results are displayed) Labs Reviewed  BASIC METABOLIC PANEL - Abnormal;  Notable for the following components:      Result Value   CO2 33 (*)    Glucose, Bld 138 (*)    BUN 22 (*)    All other components within normal limits  CBC - Abnormal; Notable for the following components:   WBC 12.6 (*)    All other components within normal limits  URINALYSIS, ROUTINE W REFLEX MICROSCOPIC - Abnormal; Notable for the following components:   Hgb urine dipstick TRACE (*)    Bacteria, UA RARE (*)    All other components within normal limits  CBG MONITORING, ED - Abnormal; Notable for the following components:   Glucose-Capillary 114 (*)    All other components within normal limits    EKG EKG Interpretation  Date/Time:  Saturday April 27 2021 14:41:47 EST Ventricular Rate:  60 PR Interval:  126 QRS Duration: 92 QT Interval:  416 QTC Calculation: 416 R Axis:   94 Text Interpretation: Normal sinus rhythm Rightward axis Anterior infarct (cited on or before 20-Feb-2018) Abnormal ECG When compared with ECG of 22-Feb-2018 06:38, Questionable change in initial forces of Anteroseptal leads ST no longer elevated in Inferior leads ST depression has replaced ST elevation in Lateral leads T wave inversion less evident in Anterior leads Confirmed by Campbell Stall ([id number]) on 12/18/1658 3:02:40 PM  Radiology No results found.  Procedures Procedures    Medications Ordered in ED Medications  lactated ringers bolus 2,000 mL (has no administration in time range)  lactated ringers infusion (has no administration in time range)    ED Course/ Medical Decision Making/ A&P                           Medical Decision Making Amount and/or Complexity of Data Reviewed Labs: ordered.  Risk Prescription drug management.  Patient is EKG per my interpretation shows no evidence of  ischemic changes. Old records reviewed and patient's care discussed with the spouse at bedside.  Patient feels much better after 2 L of IV fluids.  He is not orthostatic here.  Do not feel that he needs to have intracranial imaging as he had no head trauma.  Neurological exam is stable at this time.  Do not feel this represents stroke or cardiac etiology of his event today.  Likely it is from volume depletion and he had a vagal episode due to his chronic diarrhea from Crohn's disease.  Patient is urinalysis does show some red cells but he does have a history of this and is on Plavix..  Patient stable for discharge home        Final Clinical Impression(s) / ED Diagnoses Final diagnoses:  None    Rx / DC Orders ED Discharge Orders     None         Lacretia Leigh, MD 04/27/21 [id number]

## 2021-04-28 ENCOUNTER — Encounter: Payer: Self-pay | Admitting: Internal Medicine

## 2021-04-29 ENCOUNTER — Other Ambulatory Visit: Payer: Self-pay

## 2021-04-29 ENCOUNTER — Other Ambulatory Visit: Payer: Self-pay | Admitting: Interventional Cardiology

## 2021-04-29 DIAGNOSIS — K2950 Unspecified chronic gastritis without bleeding: Secondary | ICD-10-CM

## 2021-04-29 DIAGNOSIS — R197 Diarrhea, unspecified: Secondary | ICD-10-CM

## 2021-04-29 NOTE — Telephone Encounter (Signed)
Patient needs GI pathogen panel, please send a separate C. difficile PCR Fecal calprotectin For Crohn's flare can increase prednisone to 40 mg daily x7 days; plan to taper by 5 mg every 7 days until back to his usual dose Agree that if he cannot keep down fluids and remain hydrated he needs to return to the ER Please get him an office visit

## 2021-05-01 ENCOUNTER — Other Ambulatory Visit: Payer: BC Managed Care – PPO

## 2021-05-01 DIAGNOSIS — R197 Diarrhea, unspecified: Secondary | ICD-10-CM | POA: Diagnosis not present

## 2021-05-01 DIAGNOSIS — K2950 Unspecified chronic gastritis without bleeding: Secondary | ICD-10-CM | POA: Diagnosis not present

## 2021-05-02 ENCOUNTER — Other Ambulatory Visit: Payer: Self-pay | Admitting: Interventional Cardiology

## 2021-05-03 ENCOUNTER — Other Ambulatory Visit: Payer: Self-pay

## 2021-05-03 ENCOUNTER — Encounter: Payer: Self-pay | Admitting: Internal Medicine

## 2021-05-03 DIAGNOSIS — R197 Diarrhea, unspecified: Secondary | ICD-10-CM

## 2021-05-03 LAB — GI PROFILE, STOOL, PCR
Adenovirus F 40/41: NOT DETECTED
Astrovirus: NOT DETECTED
C difficile toxin A/B: DETECTED — AB
Campylobacter: DETECTED — AB
Cryptosporidium: NOT DETECTED
Cyclospora cayetanensis: NOT DETECTED
Entamoeba histolytica: NOT DETECTED
Enteroaggregative E coli: NOT DETECTED
Enteropathogenic E coli: NOT DETECTED
Enterotoxigenic E coli: NOT DETECTED
Giardia lamblia: NOT DETECTED
Norovirus GI/GII: NOT DETECTED
Plesiomonas shigelloides: NOT DETECTED
Rotavirus A: NOT DETECTED
Salmonella: NOT DETECTED
Sapovirus: NOT DETECTED
Shiga-toxin-producing E coli: NOT DETECTED
Shigella/Enteroinvasive E coli: NOT DETECTED
Vibrio cholerae: NOT DETECTED
Vibrio: NOT DETECTED
Yersinia enterocolitica: NOT DETECTED

## 2021-05-03 LAB — CLOSTRIDIUM DIFFICILE BY PCR: Toxigenic C. Difficile by PCR: NEGATIVE

## 2021-05-04 LAB — CALPROTECTIN, FECAL: Calprotectin, Fecal: 349 ug/g — ABNORMAL HIGH (ref 0–120)

## 2021-05-23 ENCOUNTER — Encounter: Payer: Self-pay | Admitting: Internal Medicine

## 2021-05-23 ENCOUNTER — Ambulatory Visit: Payer: Self-pay | Admitting: Internal Medicine

## 2021-05-23 ENCOUNTER — Telehealth: Payer: Self-pay

## 2021-05-23 VITALS — BP 100/60 | HR 61 | Ht 67.0 in | Wt 170.4 lb

## 2021-05-23 DIAGNOSIS — Z86010 Personal history of colonic polyps: Secondary | ICD-10-CM

## 2021-05-23 DIAGNOSIS — K5080 Crohn's disease of both small and large intestine without complications: Secondary | ICD-10-CM

## 2021-05-23 DIAGNOSIS — Z7952 Long term (current) use of systemic steroids: Secondary | ICD-10-CM

## 2021-05-23 MED ORDER — SUTAB 1479-225-188 MG PO TABS: 1.0000 | ORAL_TABLET | Freq: Once | ORAL | 0 refills | Status: AC

## 2021-05-23 NOTE — Patient Instructions (Addendum)
If you are age 54 or older, your body mass index should be between 23-30. Your Body mass index is 26.68 kg/m. If this is out of the aforementioned range listed, please consider follow up with your Primary Care Provider.  If you are age 5 or younger, your body mass index should be between 19-25. Your Body mass index is 26.68 kg/m. If this is out of the aformentioned range listed, please consider follow up with your Primary Care Provider.   ________________________________________________________  The [place] [facility] providers would like to encourage you to use [facility] to communicate with providers for non-urgent requests or questions.  Due to long hold times on the telephone, sending your provider a message by [facility] may be a faster and more efficient way to get a response.  Please allow 48 business hours for a response.  Please remember that this is for non-urgent requests.  _______________________________________________________  Jack [name] have been scheduled for a colonoscopy. Please follow written instructions given to you at your visit today.  Please pick up your prep supplies at the pharmacy within the next 1-3 days. If you use inhalers (even only as needed), please bring them with you on the day of your procedure.  You will be contacted by our office prior to your procedure for directions on holding your Plavix.  If you do not hear from our office 1 week prior to your scheduled procedure, please call [phone] to discuss.    Continue azathioprine, Entyvio and Protonix.  Thank you for entrusting me with your care and for choosing [facility], Dr. Zenovia Jarred

## 2021-05-23 NOTE — Telephone Encounter (Signed)
[facility] Pre-operative Risk Assessment     Request for surgical clearance:     Endoscopy Procedure  What type of surgery is being performed?     COLONOSCOPY  When is this surgery scheduled?     07-15-21  What type of clearance is required ?   Pharmacy  Are there any medications that need to be held prior to surgery and how long? PLAVIX 5 DAYS  Practice name and name of physician performing surgery?      [facility] DR [name]  What is your office phone and fax number?      Phone- [phone]  Fax- [phone] [place]  Anesthesia type (None, local, MAC, general) ?       MAC   THANKS

## 2021-05-23 NOTE — Progress Notes (Signed)
Subjective:    Patient ID: Jack [name], male    DOB: 02-Jan-1966, 56 y.o.   MRN: [id number]  HPI Colm Lyford is a 56 year old male with longstanding ileocolonic Crohn's disease, history of adenomatous polyps, chronic eosinophilic pneumonitis/vasculitis on chronic prednisone, Entyvio and azathioprine, history of CAD with PCI in November 2019 on Plavix who is here for follow-up.  He is here alone today and was last seen in the office in May 2022.  He reports he is back to doing well though he had diarrhea and a syncopal episode in early January.  This has resolved entirely.  Today he denies abdominal pain.  His bowel movements are occurring once daily to every other day.  No diarrhea.  No abdominal pain.  No blood in stool or melena.  No rashes, ocular complaints.  He is on prednisone 25 mg daily for his pneumonitis.  He was able to wean down but cannot get below 16 mg without a flare of his pneumonitis.  He has noticed while working out, he has been working out with a Physiological scientist, that he is having more throat clearing and coughing.  He feels that this symptom is worse also because of no Entyvio.  He has 2 weeks overdue for Entyvio due to an insurance issue.  He gets his infusions at [facility].  His son who lives in [place] had a healthy baby boy about a month ago   Review of Systems As per HPI, otherwise negative  Current Medications, Allergies, Past Medical History, Past Surgical History, Family History and Social History were reviewed in [employer] record.    Objective:   Physical Exam BP 100/60    Pulse 61    Ht 5' 7"  (1.702 m)    Wt 170 lb 6 oz (77.3 kg)    BMI 26.68 kg/m  Gen: awake, alert, NAD HEENT: anicteric CV: RRR, no mrg Pulm: CTA b/l Abd: soft, NT/ND, +BS throughout Ext: no c/c/e Neuro: nonfocal  Labs per Dr. Dossie Der at [facility]:  56 year old male with longstanding ileocolonic Crohn's disease, history  of adenomatous polyps, chronic eosinophilic pneumonitis/vasculitis on chronic prednisone, Entyvio and azathioprine, history of CAD with PCI in November 2019 on Plavix who is here for follow-up.   Crohn's ileocolitis, longstanding --symptoms under good control at present.  It should be noted that when we stopped azathioprine his Crohn's colitis flared.  He has been getting Entyvio every 4 weeks however he is 2 weeks overdue due to an insurance change.  He is due surveillance colonoscopy --Continue Entyvio 300 mg every 4 weeks at [facility]; dose ASAP --Continue azathioprine 150 mg daily; rheumatology follows his blood counts and liver enzymes closely --Surveillance colonoscopy recommended at this time; we reviewed the risk, benefits and alternatives and he is agreeable and wishes to proceed --He remains on prednisone but this is for his eosinophilic pneumonitis/vasculitis --Hold Plavix 5 days before procedure - will instruct when and how to resume after procedure. Risks and benefits of procedure including bleeding, perforation, infection, missed lesions, medication reactions and possible hospitalization or surgery if complications occur explained. Additional rare but real risk of cardiovascular event such as heart attack or ischemia/infarct of other organs off Plavix explained and need to seek urgent help if this occurs. Will communicate by phone or EMR with patient's prescribing provider that to confirm holding Plavix is reasonable in this case.   2.  History of gastritis --Daily PPI  for gastric protection in the setting of chronic steroid use  3.  History of IDA secondary to IBD --currently in remission with normal blood counts  4.  Eosinophilic pneumonitis/vasculitis --per rheumatology  30 minutes total spent today including patient facing time, coordination of care, reviewing medical history/procedures/pertinent radiology studies, and documentation of the encounter.

## 2021-05-24 NOTE — Telephone Encounter (Signed)
Primary Cardiologist:Henry Nicholes Stairs III, MD  Chart reviewed as part of pre-operative protocol coverage. Because of Jack [name]'s past medical history and time since last visit, he/she will require a follow-up visit in order to better assess preoperative cardiovascular risk.  Pre-op covering staff: - Please schedule appointment and call patient to inform them. - Please contact requesting surgeon's office via preferred method (i.e, phone, fax) to inform them of need for appointment prior to surgery.  If applicable, this message will also be routed to pharmacy pool and/or primary cardiologist for input on holding anticoagulant/antiplatelet agent as requested below so that this information is available at time of patient's appointment.   Deberah Pelton, NP  05/24/2021, 12:48 PM

## 2021-05-24 NOTE — Telephone Encounter (Signed)
Pt has appt with Dr. Tamala Julian 06/17/21, will add pre op clearance needed to appt notes. Will send FYI to requesting office the pt has appt 06/17/21.

## 2021-06-03 ENCOUNTER — Encounter: Payer: Self-pay | Admitting: Internal Medicine

## 2021-06-05 DIAGNOSIS — K5090 Crohn's disease, unspecified, without complications: Secondary | ICD-10-CM | POA: Diagnosis not present

## 2021-06-10 DIAGNOSIS — R351 Nocturia: Secondary | ICD-10-CM | POA: Diagnosis not present

## 2021-06-10 DIAGNOSIS — N401 Enlarged prostate with lower urinary tract symptoms: Secondary | ICD-10-CM | POA: Diagnosis not present

## 2021-06-16 NOTE — Progress Notes (Signed)
?Cardiology Office Note:   ? ?Date:  06/17/2021  ? ?ID:  Jack [name], DOB 1965/06/13, MRN [id number] ? ?PCP:  [name], MD  ?Cardiologist:  Sinclair Grooms, MD  ? ?Referring MD: [name], MD  ? ?Chief Complaint  ?Patient presents with  ? Congestive Heart Failure  ? Coronary Artery Disease  ? ? ?History of Present Illness:   ? ?Jack [name] is a 56 y.o. male with a hx of CAD status post STEMI 11/2017 treated with DES to the LAD with residual moderate CAD in the circumflex and RCA. Developed post infarct pericarditis, decreased LV systolic function EF 32% with LV thrombus requiring Coumadin. Developed a significant GI bleed on Coumadin and dual antiplatelet therapy. Also has essential hypertension, seizure disorder, eosinophilic granulomatosis, Crohn's disease removal of intra-dural meningioma of the lower spine.  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 ? ? ?Osmond has inflammatory bowel disease.  He states Entyvio was missed and after the fourth week of therapy he would note exertional shortness of breath and atypical chest discomfort.  If he stops and rests it goes away within a minute.  He has had no difficulty doing his job which includes running his mattress store, lifting, and being on his feet all day.  He has no orthopnea, PND, and has not used nitroglycerin.  Otherwise doing well. ? ?Past Medical History:  ?Diagnosis Date  ? Acquired renal cyst of right kidney 08/31/2018  ? 2.8cm R upper pole rec monitor with yearly imaging (09/2018)  ? Allergy   ? BPH (benign prostatic hypertrophy)   ? CAD (coronary artery disease)   ? a. anterior STEMI 01/2018 -  proximal occlusion of LAD, treated with DES. Cath also showed 20% distal LM, 95% ostial-prox small-moderate ramus, 70-80% ostial Cx, 70% dominant ostial OM, 50-60% prox Cx, 70% RCA. EF 35% by cath with LVEDP 77mHg. Med rx for residual disease. Course complicated by post MI pericarditis and LV  thrombus.  ? Chronic systolic (congestive) heart failure (HCC)   ? Colitis   ? Colon polyp   ? inflammatory  ? Crohn disease ([facility] [place] 1992  ? history uveitis, involvement of intestines and lungs  ? Diverticulosis   ? Eosinophilic granuloma ([facility]   ? Essential hypertension   ? GERD (gastroesophageal reflux disease)   ? GI bleeding   ? History of chicken pox   ? History of gastroesophageal reflux (GERD)   ? History of seizure 1995  ? grand mal x1, completed 6 yrs dilantin. no seizures since  ? Hyperlipidemia   ? Internal hemorrhoids   ? Ischemic cardiomyopathy   ? LV (left ventricular) mural thrombus following MI ([facility] 01/2018  ? Myocardial infarction ([facility]   ? Osteoporosis 11/2015  ? DEXA T -2.9  ? Schwannoma 2007  ? L axilla s/p surgery  ? Seizures ([facility]   ? last seizure 1995 and only x 1 seizure-   ? Ulnar neuropathy   ? h/o this from L arm schwannoma  ? ? ?Past Surgical History:  ?Procedure Laterality Date  ? APPENDECTOMY  2000  ? BACK SURGERY  2011  ? lumbar  ? CARDIAC CATHETERIZATION    ? CARDIOVASCULAR STRESS TEST  01/2015  ? low risk study  ? CHOLECYSTECTOMY  2000  ? COLONOSCOPY  08/2014  ? f/u crohn's, 4 inflammatory polyps, diverticulosis, improved, rpt 2 yrs ([place])  ? COLONOSCOPY  11/2018  ? multiple polyps, 1 TA, rpt 2  yrs ([place])  ? COLONOSCOPY WITH PROPOFOL N/A 04/09/2018  ? inflammatory polyp, f/u left to primary GI - [place] (Danis, Kirke Corin, MD)  ? CORONARY ANGIOGRAPHY N/A 02/21/2018  ? Procedure: CORONARY ANGIOGRAPHY;  Surgeon: Belva Crome, MD;  Location: [place] [facility];  Service: Cardiovascular;  Laterality: N/A;  ? CORONARY/GRAFT ACUTE MI REVASCULARIZATION N/A 02/20/2018  ? Procedure: Coronary/Graft Acute MI Revascularization;  Surgeon: Belva Crome, MD;  Location: [place] [facility];  Service: Cardiovascular;  Laterality: N/A;  ? ESOPHAGOGASTRODUODENOSCOPY  07/2014  ? h/o EE resolved, focal reflux esophagitis, chronic active gastritis  ? ESOPHAGOGASTRODUODENOSCOPY  11/2018  ? gastritis,  no active crohn's ([place])  ? extremity surgery Left   ? ulnar nerve repair after schwannoma removal  ? FLEXIBLE SIGMOIDOSCOPY N/A 04/14/2018  ? Procedure: FLEXIBLE SIGMOIDOSCOPY;  Surgeon: Milus Banister, MD;  Location: [facility] ENDOSCOPY;  Service: Endoscopy;  Laterality: N/A;  ? INGUINAL HERNIA REPAIR Bilateral 2017  ? LAMINECTOMY N/A 08/09/2018  ? Procedure: Lumbar three Laminectomy, excision of intradural tumor;  Surgeon: Kristeen Miss, MD;  Location: [place];  Service: Neurosurgery;  Laterality: N/A;  ? LEFT HEART CATH AND CORONARY ANGIOGRAPHY N/A 02/20/2018  ? Procedure: LEFT HEART CATH AND CORONARY ANGIOGRAPHY;  Surgeon: Belva Crome, MD;  Location: [place] [facility];  Service: Cardiovascular;  Laterality: N/A;  ? LUMBAR SPINE SURGERY  2011  ? POLYPECTOMY  04/09/2018  ? Procedure: POLYPECTOMY;  Surgeon: Doran Stabler, MD;  Location: [facility] ENDOSCOPY;  Service: Gastroenterology;;  ? SPINAL CORD STIMULATOR IMPLANT  2012  ? x2 (Dr Berton Mount)  ? SUBMUCOSAL INJECTION  04/09/2018  ? Procedure: SUBMUCOSAL INJECTION;  Surgeon: Doran Stabler, MD;  Location: [facility] ENDOSCOPY;  Service: Gastroenterology;;  ? TONSILLECTOMY  1996  ? TUMOR REMOVAL Left 2007  ? schwannoma from L armpit  ? UPPER GASTROINTESTINAL ENDOSCOPY    ? ? ?Current Medications: ?Current Meds  ?Medication Sig  ? acetaminophen (TYLENOL) 325 MG tablet Take 650 mg by mouth every 6 (six) hours as needed for mild pain or headache.   ? alendronate (FOSAMAX) 70 MG tablet Take 70 mg by mouth every Saturday.   ? amitriptyline (ELAVIL) 25 MG tablet TAKE 1 TABLET (25 MG TOTAL) BY MOUTH AT BEDTIME AS NEEDED FOR SLEEP.  ? atorvastatin (LIPITOR) 80 MG tablet TAKE 1 TABLET (80 MG TOTAL) BY MOUTH DAILY AT 6 PM.  ? azaTHIOprine (IMURAN) 50 MG tablet TAKE 3 TABLETS BY MOUTH EVERY DAY  ? Calcium Carbonate-Vitamin D3 (CALCIUM 600/VITAMIN D) 600-400 MG-UNIT TABS Take 1 tablet by mouth daily.  ? carvedilol (COREG) 6.25 MG tablet TAKE 1 TABLET BY MOUTH TWICE A DAY  ? clopidogrel  (PLAVIX) 75 MG tablet TAKE 1 TABLET BY MOUTH EVERY DAY  ? diphenoxylate-atropine (LOMOTIL) 2.5-0.025 MG tablet TAKE 1-2 TABLETS BY MOUTH 4 TIMES A DAY AS NEEDED FOR DIARRHEA  ? ENTRESTO 24-26 MG TAKE 1 TABLET BY MOUTH 2 (TWO) TIMES DAILY.  ? ezetimibe (ZETIA) 10 MG tablet TAKE 1 TABLET BY MOUTH EVERY DAY  ? finasteride (PROSCAR) 5 MG tablet Take 1 tablet (5 mg total) by mouth daily.  ? icosapent Ethyl (VASCEPA) 1 g capsule TAKE 2 CAPSULES (2 G TOTAL) BY MOUTH 2 (TWO) TIMES DAILY.  ? Iron, Ferrous Sulfate, 325 (65 Fe) MG TABS Take 1 tablet by mouth daily.  ? meloxicam (MOBIC) 7.5 MG tablet Take 7.5 mg by mouth daily.  ? Multiple Vitamin (MULTIVITAMIN) capsule Take 1 capsule by mouth daily.  ?  nitroGLYCERIN (NITROSTAT) 0.4 MG SL tablet Place 1 tablet (0.4 mg total) under the tongue every 5 (five) minutes as needed for chest pain.  ? pantoprazole (PROTONIX) 40 MG tablet TAKE 1 TABLET BY MOUTH EVERY DAY  ? predniSONE (DELTASONE) 10 MG tablet Take as directed (Currently taking 21 mg daily- decrease by 1 mg as tolerated)  ? predniSONE (DELTASONE) 5 MG tablet Take as directed. (currently on 21 mg daily- decrease by 1 mg as tolerated)  ? silodosin (RAPAFLO) 8 MG CAPS capsule Take 1 capsule (8 mg total) by mouth daily with breakfast.  ? spironolactone (ALDACTONE) 25 MG tablet TAKE A HALF TABLET (12.5MG TOTAL) BY MOUTH ONCE DAILY ON MONDAY AND THURSDAY.  ? tadalafil (CIALIS) 20 MG tablet Take 0.5-1 tablets (10-20 mg total) by mouth daily as needed for erectile dysfunction.  ? vedolizumab (ENTYVIO) 300 MG injection Inject 300 mg into the vein. Every 4 weeks  ?  ? ?Allergies:   Cephalexin and Duloxetine  ? ?Social History  ? ?Socioeconomic History  ? Marital status: Married  ?  Spouse name: Mickel Baas  ? Number of children: 2  ? Years of education: 49  ? Highest education level: Bachelor's degree (e.g., BA, AB, BS)  ?Occupational History  ? Occupation: Self-employed  ?  Comment: Patient owns 2 mattress stores  ?Tobacco Use  ?  Smoking status: Former  ?  Types: Cigars  ?  Quit date: 02/20/2018  ?  Years since quitting: 3.3  ? Smokeless tobacco: Never  ?Vaping Use  ? Vaping Use: Never used  ?Substance and Sexual Activity  ? Alcohol use: Alfred Levins

## 2021-06-17 ENCOUNTER — Encounter: Payer: Self-pay | Admitting: Interventional Cardiology

## 2021-06-17 ENCOUNTER — Ambulatory Visit: Payer: BC Managed Care – PPO | Admitting: Interventional Cardiology

## 2021-06-17 ENCOUNTER — Other Ambulatory Visit: Payer: Self-pay

## 2021-06-17 ENCOUNTER — Encounter: Payer: Self-pay | Admitting: *Deleted

## 2021-06-17 VITALS — BP 112/68 | HR 76 | Ht 67.0 in | Wt 173.8 lb

## 2021-06-17 DIAGNOSIS — R972 Elevated prostate specific antigen [PSA]: Secondary | ICD-10-CM | POA: Diagnosis not present

## 2021-06-17 DIAGNOSIS — I5022 Chronic systolic (congestive) heart failure: Secondary | ICD-10-CM

## 2021-06-17 DIAGNOSIS — R3912 Poor urinary stream: Secondary | ICD-10-CM | POA: Diagnosis not present

## 2021-06-17 DIAGNOSIS — R0609 Other forms of dyspnea: Secondary | ICD-10-CM

## 2021-06-17 DIAGNOSIS — I10 Essential (primary) hypertension: Secondary | ICD-10-CM

## 2021-06-17 DIAGNOSIS — E785 Hyperlipidemia, unspecified: Secondary | ICD-10-CM | POA: Diagnosis not present

## 2021-06-17 DIAGNOSIS — N401 Enlarged prostate with lower urinary tract symptoms: Secondary | ICD-10-CM | POA: Diagnosis not present

## 2021-06-17 DIAGNOSIS — Z7901 Long term (current) use of anticoagulants: Secondary | ICD-10-CM

## 2021-06-17 DIAGNOSIS — G4733 Obstructive sleep apnea (adult) (pediatric): Secondary | ICD-10-CM

## 2021-06-17 DIAGNOSIS — I2510 Atherosclerotic heart disease of native coronary artery without angina pectoris: Secondary | ICD-10-CM | POA: Diagnosis not present

## 2021-06-17 NOTE — Addendum Note (Signed)
Addended by: Loren Racer on: 06/17/2021 02:12 PM ? ? Modules accepted: Orders ? ?

## 2021-06-17 NOTE — Patient Instructions (Signed)
Medication Instructions:  ?Your physician recommends that you continue on your current medications as directed. Please refer to the Current Medication list given to you today. ? ?*If you need a refill on your cardiac medications before your next appointment, please call your pharmacy* ? ? ?Lab Work: ?None ?If you have labs (blood work) drawn today and your tests are completely normal, you will receive your results only by: ?MyChart Message (if you have MyChart) OR ?A paper copy in the mail ?If you have any lab test that is abnormal or we need to change your treatment, we will call you to review the results. ? ? ?Testing/Procedures: ?Your physician has requested that you have an echocardiogram. Echocardiography is a painless test that uses sound waves to create images of your heart. It provides your doctor with information about the size and shape of your heart and how well your heart?s chambers and valves are working. This procedure takes approximately one hour. There are no restrictions for this procedure. ? ?Your physician has requested that you have en exercise stress myoview. For further information please visit [email or web address]. Please follow instruction sheet, as given. ? ? ?Follow-Up: ?At [facility], you and your health needs are our priority.  As part of our continuing mission to provide you with exceptional heart care, we have created designated Provider Care Teams.  These Care Teams include your primary Cardiologist (physician) and Advanced Practice Providers (APPs -  Physician Assistants and Nurse Practitioners) who all work together to provide you with the care you need, when you need it. ? ?We recommend signing up for the patient portal called "MyChart".  Sign up information is provided on this After Visit Summary.  MyChart is used to connect with patients for Virtual Visits (Telemedicine).  Patients are able to view lab/test results, encounter notes, upcoming appointments, etc.  Non-urgent  messages can be sent to your provider as well.   ?To learn more about what you can do with MyChart, go to [email or web address].   ? ?Your next appointment:   ?1 year(s) ? ?The format for your next appointment:   ?In Person ? ?Provider:   ?Sinclair Grooms, MD  ? ? ?Other Instructions ?  ?

## 2021-06-17 NOTE — Addendum Note (Signed)
Addended by: Belva Crome on: 06/17/2021 02:38 PM ? ? Modules accepted: Orders ? ?

## 2021-06-21 ENCOUNTER — Telehealth (HOSPITAL_COMMUNITY): Payer: Self-pay | Admitting: *Deleted

## 2021-06-21 NOTE — Telephone Encounter (Signed)
Patient'[name] wife given detailed instructions per Myocardial Perfusion Study Information Sheet for the test on 06/28/21 at 7:. Patient notified to arrive 15 minutes early and that it is imperative to arrive on time for appointment to keep from having the test rescheduled. ? If you need to cancel or reschedule your appointment, please call the office within 24 hours of your appointment. . Patient verbalized understanding.Jack [name] ? ? ?

## 2021-06-27 ENCOUNTER — Other Ambulatory Visit: Payer: Self-pay | Admitting: Interventional Cardiology

## 2021-06-28 ENCOUNTER — Ambulatory Visit (HOSPITAL_BASED_OUTPATIENT_CLINIC_OR_DEPARTMENT_OTHER): Payer: BC Managed Care – PPO

## 2021-06-28 ENCOUNTER — Ambulatory Visit (HOSPITAL_COMMUNITY): Payer: BC Managed Care – PPO | Attending: Cardiology

## 2021-06-28 DIAGNOSIS — R0609 Other forms of dyspnea: Secondary | ICD-10-CM | POA: Diagnosis not present

## 2021-06-28 DIAGNOSIS — I5022 Chronic systolic (congestive) heart failure: Secondary | ICD-10-CM | POA: Insufficient documentation

## 2021-06-28 DIAGNOSIS — I2510 Atherosclerotic heart disease of native coronary artery without angina pectoris: Secondary | ICD-10-CM

## 2021-06-28 LAB — ECHOCARDIOGRAM COMPLETE
Area-P 1/2: 1.98 cm2
MV M vel: 5.07 m/s
MV Peak grad: 102.6 mmHg
S' Lateral: 5.2 cm

## 2021-06-28 MED ORDER — TECHNETIUM TC 99M TETROFOSMIN IV KIT
10.2000 | PACK | Freq: Once | INTRAVENOUS | Status: AC | PRN
  Administered 2021-06-28: 10.2 via INTRAVENOUS
  Filled 2021-06-28: qty 11

## 2021-06-28 MED ORDER — PERFLUTREN LIPID MICROSPHERE
1.0000 mL | INTRAVENOUS | Status: AC | PRN
  Administered 2021-06-28: 2 mL via INTRAVENOUS

## 2021-06-30 ENCOUNTER — Other Ambulatory Visit: Payer: Self-pay | Admitting: Interventional Cardiology

## 2021-07-01 ENCOUNTER — Ambulatory Visit (HOSPITAL_COMMUNITY): Payer: BC Managed Care – PPO | Attending: Cardiology

## 2021-07-01 DIAGNOSIS — R0609 Other forms of dyspnea: Secondary | ICD-10-CM | POA: Insufficient documentation

## 2021-07-01 DIAGNOSIS — I2510 Atherosclerotic heart disease of native coronary artery without angina pectoris: Secondary | ICD-10-CM | POA: Insufficient documentation

## 2021-07-01 DIAGNOSIS — I5022 Chronic systolic (congestive) heart failure: Secondary | ICD-10-CM | POA: Diagnosis not present

## 2021-07-01 LAB — MYOCARDIAL PERFUSION IMAGING
Base ST Depression (mm): 0 mm
LV dias vol: 194 mL (ref 62–150)
LV sys vol: 127 mL
Nuc Stress EF: 34 %
Peak HR: 92 {beats}/min
Rest HR: 68 {beats}/min
Rest Nuclear Isotope Dose: 10.2 mCi
SDS: 3
SRS: 22
SSS: 27
ST Depression (mm): 0 mm
Stress Nuclear Isotope Dose: 32.9 mCi
TID: 1.02

## 2021-07-01 MED ORDER — REGADENOSON 0.4 MG/5ML IV SOLN
0.4000 mg | Freq: Once | INTRAVENOUS | Status: AC
  Administered 2021-07-01: 0.4 mg via INTRAVENOUS

## 2021-07-01 MED ORDER — TECHNETIUM TC 99M TETROFOSMIN IV KIT
32.9000 | PACK | Freq: Once | INTRAVENOUS | Status: AC | PRN
  Administered 2021-07-01: 32.9 via INTRAVENOUS
  Filled 2021-07-01: qty 33

## 2021-07-01 NOTE — Telephone Encounter (Signed)
Please advise regarding colonoscopy procedure with Dr. Zenovia Jarred scheduled for 4-17. Thank you ?

## 2021-07-02 ENCOUNTER — Telehealth: Payer: Self-pay | Admitting: *Deleted

## 2021-07-02 DIAGNOSIS — I5022 Chronic systolic (congestive) heart failure: Secondary | ICD-10-CM

## 2021-07-02 MED ORDER — SPIRONOLACTONE 25 MG PO TABS: 12.5000 mg | ORAL_TABLET | ORAL | 3 refills | Status: DC

## 2021-07-02 MED ORDER — DAPAGLIFLOZIN PROPANEDIOL 10 MG PO TABS: 10.0000 mg | ORAL_TABLET | Freq: Every day | ORAL | 11 refills | Status: DC

## 2021-07-02 NOTE — Telephone Encounter (Signed)
-----   Message from Belva Crome, MD sent at 06/29/2021  1:46 PM EDT ----- ?Let the patient know the EF remains decreased and is same as prior. Add [place] or  Jardiance 10 mg daily. Check BNP and BMET in 2 weeks. Increase Spironolactone to 12.5 mg M, W, and F. ?A copy will be sent to [name], MD ?

## 2021-07-02 NOTE — Telephone Encounter (Signed)
Spoke with pt's wife and reviewed echo and stress test results and recommendations.  Pt will come for labs on 4/19.  Pt will come by the office to pick up samples of Farxiga.  Advised of possible side effects.  Wife verbalized understanding and was agreeable to plan.  ?

## 2021-07-03 DIAGNOSIS — K5090 Crohn's disease, unspecified, without complications: Secondary | ICD-10-CM | POA: Diagnosis not present

## 2021-07-04 ENCOUNTER — Encounter: Payer: Self-pay | Admitting: Internal Medicine

## 2021-07-04 NOTE — Telephone Encounter (Signed)
Called and spoke to pt's wife, Mickel Baas. She understands that he will hold Plavix starting on 4-12 until procedure with Dr. [name] ?

## 2021-07-04 NOTE — Telephone Encounter (Signed)
? ? ?  Patient Name: Jack [name]  ?DOB: Sep 11, 1965 ?MRN: [id number] ? ?Primary Cardiologist: Sinclair Grooms, MD ? ?Chart reviewed as part of pre-operative protocol coverage. Given past medical history and time since last visit, based on ACC/AHA guidelines, VOYD GROFT would be at acceptable risk for the planned procedure without further cardiovascular testing.  ? ?The patient was advised that if he develops new symptoms prior to surgery to contact our office to arrange for a follow-up visit, and he verbalized understanding. ? ?Per Dr. Tamala Julian, patient may hold Plavix for 5 days prior to procedure.  Please resume Plavix as soon as possible postprocedure, at the discretion of the surgeon. ? ?I will route this recommendation to the requesting party via Epic fax function and remove from pre-op pool. ? ?Please call with questions. ? ?Lenna Sciara, NP ?07/04/2021, 8:06 AM ? ?

## 2021-07-15 ENCOUNTER — Encounter: Payer: Self-pay | Admitting: Internal Medicine

## 2021-07-17 ENCOUNTER — Other Ambulatory Visit: Payer: BC Managed Care – PPO

## 2021-07-17 DIAGNOSIS — I5022 Chronic systolic (congestive) heart failure: Secondary | ICD-10-CM | POA: Diagnosis not present

## 2021-07-18 ENCOUNTER — Encounter: Payer: Self-pay | Admitting: Internal Medicine

## 2021-07-18 LAB — BASIC METABOLIC PANEL
BUN/Creatinine Ratio: 19 (ref 9–20)
BUN: 27 mg/dL — ABNORMAL HIGH (ref 6–24)
CO2: 28 mmol/L (ref 20–29)
Calcium: 9.5 mg/dL (ref 8.7–10.2)
Chloride: 103 mmol/L (ref 96–106)
Creatinine, Ser: 1.39 mg/dL — ABNORMAL HIGH (ref 0.76–1.27)
Glucose: 86 mg/dL (ref 70–99)
Potassium: 4.5 mmol/L (ref 3.5–5.2)
Sodium: 142 mmol/L (ref 134–144)
eGFR: 61 mL/min/{1.73_m2} (ref >59)

## 2021-07-18 LAB — PRO B NATRIURETIC PEPTIDE: NT-Pro BNP: 470 pg/mL — ABNORMAL HIGH (ref 0–121)

## 2021-07-22 ENCOUNTER — Other Ambulatory Visit: Payer: BC Managed Care – PPO

## 2021-07-22 NOTE — H&P (View-Only) (Signed)
?Cardiology Office Note:   ? ?Date:  07/24/2021  ? ?ID:  Jack [name], DOB 1966-02-09, MRN [id number] ? ?PCP:  [name], MD  ?Cardiologist:  Sinclair Grooms, MD  ? ?Referring MD: [name], MD  ? ?Chief Complaint  ?Patient presents with  ? Coronary Artery Disease  ? Follow-up  ?  Ischemic cardiomyopathy with chronic systolic heart failure  ? ? ?History of Present Illness:   ? ?Jack [name] is a 56 y.o. male with a hx of  CAD status post STEMI 11/2017 treated with DES to the LAD with residual moderate CAD in the circumflex and RCA. Developed post infarct pericarditis, decreased LV systolic function EF 40% with LV thrombus requiring Coumadin. Developed a significant GI bleed on Coumadin and dual antiplatelet therapy. Also has essential hypertension, seizure disorder, eosinophilic granulomatosis, Crohn's disease removal of intra-dural meningioma of the lower spine.  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 ? ?Systolic heart failure therapy: ARMI;Spiro; Farxiga; and Carvedilol. ? ?LVEF has been in the 35 to 40% range since his myocardial infarction in 2019.  On guideline directed therapy the EF is ranged in the 35 to 40%.  We added Farxiga on the last office visit after EF of 35% on myocardial perfusion imaging.  The perfusion imaging study as noted scar but no ischemia.  In speaking with the patient, doing cardio activity he gets a tightness in the upper sternal area that builds in intensity with duration and intensity of physical activity.  If he stops and rests it goes away within minutes.  This is new over the past 2 months.  It is not improved with medication adjustment. ? ?Past Medical History:  ?Diagnosis Date  ? Acquired renal cyst of right kidney 08/31/2018  ? 2.8cm R upper pole rec monitor with yearly imaging (09/2018)  ? Allergy   ? BPH (benign prostatic hypertrophy)   ? CAD (coronary artery disease)   ? a. anterior STEMI 01/2018 -  proximal  occlusion of LAD, treated with DES. Cath also showed 20% distal LM, 95% ostial-prox small-moderate ramus, 70-80% ostial Cx, 70% dominant ostial OM, 50-60% prox Cx, 70% RCA. EF 35% by cath with LVEDP 65mHg. Med rx for residual disease. Course complicated by post MI pericarditis and LV thrombus.  ? Chronic systolic (congestive) heart failure (HCC)   ? Colitis   ? Colon polyp   ? inflammatory  ? Cro[place] disease ([place] 1992  ? history uveitis, involvement of intestines and lungs  ? Diverticulosis   ? Eosinophilic granuloma ([place]   ? Essential hypertension   ? GERD (gastroesophageal reflux disease)   ? GI bleeding   ? History of chicken pox   ? History of gastroesophageal reflux (GERD)   ? History of seizure 1995  ? grand mal x1, completed 6 yrs dilantin. no seizures since  ? Hyperlipidemia   ? Internal hemorrhoids   ? Ischemic cardiomyopathy   ? LV (left ventricular) mural thrombus [place] ([place] 01[place]  ? Myocardial infarction ([facility]   ? Osteoporosis 11/2015  ? DEXA T -2.9  ? Schwannoma 2007  ? L axilla s/p surgery  ? [place]izures ([facility]   ? last seizure 1995 and only x 1 seizure-   ? Ulnar neuropathy   ? h/o this from L arm schwannoma  ? ? ?Past Surgical History:  ?Procedure Laterality Date  ? APPENDECTOMY  2000  ? BACK SURGERY  2011  ? lumbar  ?  CARDIAC CATHETERIZATION    ? CARDIOVASCULAR STRESS TEST  01/2015  ? low risk study  ? CHOLECYSTECTOMY  2000  ? COLONOSCOPY  08/2014  ? f/u crohn's, 4 inflammatory polyps, diverticulosis, improved, rpt 2 yrs ([place])  ? COLONOSCOPY  11/2018  ? multiple polyps, 1 TA, rpt 2 yrs ([facility])  ? COLONOSCOPY WITH PROPOFOL N/A 04/09/2018  ? inflammatory polyp, f/u left to primary GI - [place] (Danis, Kirke Corin, MD)  ? CORONARY ANGIOGRAPHY N/A 02/21/2018  ? Procedure: CORONARY ANGIOGRAPHY;  Surgeon: Belva Crome, MD;  Location: [place] CV LAB;  Service: Cardiovascular;  Laterality: N/A;  ? CORONARY/GRAFT ACUTE MI REVASCULARIZATION N/A 02/20/2018  ? Procedure: Coronary/Graft Acute MI  Revascularization;  Surgeon: Belva Crome, MD;  Location: [place] CV LAB;  Service: Cardiovascular;  Laterality: N/A;  ? ESOPHAGOGASTRODUODENOSCOPY  07/2014  ? h/o EE resolved, focal reflux esophagitis, chronic active gastritis  ? ESOPHAGOGASTRODUODENOSCOPY  11/2018  ? gastritis, no active crohn's ([place])  ? extremity surgery Left   ? ulnar nerve repair after schwannoma removal  ? FLEXIBLE SIGMOIDOSCOPY N/A 04/14/2018  ? Procedure: FLEXIBLE SIGMOIDOSCOPY;  Surgeon: Milus Banister, MD;  [facility] ENDOSCOPY;  Service: Endoscopy;  Laterality: N/A;  ? INGUINAL HERNIA REPAIR Bilateral 2017  ? LAMINECTOMY N/A 08/09/2018  ? Procedure: Lumbar three Laminectomy, excision of intradural tumor;  Surgeon: Kristeen Miss, MD;  Location: [place];  Service: Neurosurgery;  Laterality: N/A;  ? LEFT HEART CATH AND CORONARY ANGIOGRAPHY N/A 02/20/2018  ? Procedure: LEFT HEART CATH AND CORONARY ANGIOGRAPHY;  Surgeon: Belva Crome, MD;  Location: [place] CV LAB;  Service: Cardiovascular;  Laterality: N/A;  ? LUMBAR SPINE SURGERY  2011  ? POLYPECTOMY  04/09/2018  ? Procedure: POLYPECTOMY;  Surgeon: Doran Stabler, MD;  Location: [facility] ENDOSCOPY;  Service: Gastroenterology;;  ? SPINAL CORD STIMULATOR IMPLANT  2012  ? x2 (Dr Berton Mount)  ? SUBMUCOSAL INJECTION  04/09/2018  ? Procedure: SUBMUCOSAL INJECTION;  Surgeon: Doran Stabler, MD;  Location: [facility] ENDOSCOPY;  Service: Gastroenterology;;  ? TONSILLECTOMY  1996  ? TUMOR REMOVAL Left 2007  ? schwannoma from L armpit  ? UPPER GASTROINTESTINAL ENDOSCOPY    ? ? ?Current Medications: ?Current Meds  ?Medication Sig  ? acetaminophen (TYLENOL) 325 MG tablet Take 650 mg by mouth every 6 (six) hours as needed for mild pain or headache.   ? alendronate (FOSAMAX) 70 MG tablet Take 70 mg by mouth every Saturday.   ? amitriptyline (ELAVIL) 25 MG tablet TAKE 1 TABLET (25 MG TOTAL) BY MOUTH AT BEDTIME AS NEEDED FOR SLEEP.  ? atorvastatin (LIPITOR) 80 MG tablet TAKE 1 TABLET BY MOUTH DAILY AT 6  PM.  ? azaTHIOprine (IMURAN) 50 MG tablet TAKE 3 TABLETS BY MOUTH EVERY DAY  ? Calcium Carbonate-Vitamin D3 (CALCIUM 600/VITAMIN D) 600-400 MG-UNIT TABS Take 1 tablet by mouth daily.  ? carvedilol (COREG) 6.25 MG tablet TAKE 1 TABLET BY MOUTH TWICE A DAY  ? clopidogrel (PLAVIX) 75 MG tablet TAKE 1 TABLET BY MOUTH EVERY DAY  ? dapagliflozin propanediol (FARXIGA) 10 MG TABS tablet Take 1 tablet (10 mg total) by mouth daily before breakfast.  ? diphenoxylate-atropine (LOMOTIL) 2.5-0.025 MG tablet TAKE 1-2 TABLETS BY MOUTH 4 TIMES A DAY AS NEEDED FOR DIARRHEA  ? ENTRESTO 24-26 MG TAKE 1 TABLET BY MOUTH 2 (TWO) TIMES DAILY.  ? ezetimibe (ZETIA) 10 MG tablet TAKE 1 TABLET BY MOUTH EVERY DAY  ? finasteride (PROSCAR) 5 MG tablet Take 1 tablet (  5 mg total) by mouth daily.  ? icosapent Ethyl (VASCEPA) 1 g capsule TAKE 2 CAPSULES BY MOUTH 2 TIMES DAILY.  ? Iron, Ferrous Sulfate, 325 (65 Fe) MG TABS Take 1 tablet by mouth daily.  ? meloxicam (MOBIC) 7.5 MG tablet Take 7.5 mg by mouth daily.  ? Multiple Vitamin (MULTIVITAMIN) capsule Take 1 capsule by mouth daily.  ? nitroGLYCERIN (NITROSTAT) 0.4 MG SL tablet Place 1 tablet (0.4 mg total) under the tongue every 5 (five) minutes as needed for chest pain.  ? pantoprazole (PROTONIX) 40 MG tablet TAKE 1 TABLET BY MOUTH EVERY DAY  ? predniSONE (DELTASONE) 10 MG tablet Take as directed (Currently taking 21 mg daily- decrease by 1 mg as tolerated)  ? predniSONE (DELTASONE) 5 MG tablet Take as directed. (currently on 21 mg daily- decrease by 1 mg as tolerated)  ? silodosin (RAPAFLO) 8 MG CAPS capsule Take 1 capsule (8 mg total) by mouth daily with breakfast.  ? spironolactone (ALDACTONE) 25 MG tablet Take 0.5 tablets (12.5 mg total) by mouth every Monday, Wednesday, and Friday.  ? tadalafil (CIALIS) 20 MG tablet Take 0.5-1 tablets (10-20 mg total) by mouth daily as needed for erectile dysfunction.  ? vedolizumab (ENTYVIO) 300 MG injection Inject 300 mg into the vein. Every 4 weeks  ?   ? ?Allergies:   Cephalexin and Duloxetine  ? ?Social History  ? ?Socioeconomic History  ? Marital status: Married  ?  Spouse name: Mickel Baas  ? Number of children: 2  ? Years of education: 45  ? Highest educatio

## 2021-07-22 NOTE — Progress Notes (Signed)
?Cardiology Office Note:   ? ?Date:  07/24/2021  ? ?ID:  Jack [name], DOB May 19, 1965, MRN [id number] ? ?PCP:  [name], MD  ?Cardiologist:  Sinclair Grooms, MD  ? ?Referring MD: [name], MD  ? ?Chief Complaint  ?Patient presents with  ? Coronary Artery Disease  ? Follow-up  ?  Ischemic cardiomyopathy with chronic systolic heart failure  ? ? ?History of Present Illness:   ? ?Jack [name] is a 56 y.o. male with a hx of  CAD status post STEMI 11/2017 treated with DES to the LAD with residual moderate CAD in the circumflex and RCA. Developed post infarct pericarditis, decreased LV systolic function EF 40% with LV thrombus requiring Coumadin. Developed a significant GI bleed on Coumadin and dual antiplatelet therapy. Also has essential hypertension, seizure disorder, eosinophilic granulomatosis, Crohn's disease removal of intra-dural meningioma of the lower spine.  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 ? ?Systolic heart failure therapy: ARMI;Spiro; Farxiga; and Carvedilol. ? ?LVEF has been in the 35 to 40% range since his myocardial infarction in 2019.  On guideline directed therapy the EF is ranged in the 35 to 40%.  We added Farxiga on the last office visit after EF of 35% on myocardial perfusion imaging.  The perfusion imaging study as noted scar but no ischemia.  In speaking with the patient, doing cardio activity he gets a tightness in the upper sternal area that builds in intensity with duration and intensity of physical activity.  If he stops and rests it goes away within minutes.  This is new over the past 2 months.  It is not improved with medication adjustment. ? ?Past Medical History:  ?Diagnosis Date  ? Acquired renal cyst of right kidney 08/31/2018  ? 2.8cm R upper pole rec monitor with yearly imaging (09/2018)  ? Allergy   ? BPH (benign prostatic hypertrophy)   ? CAD (coronary artery disease)   ? a. anterior STEMI 01/2018 -  proximal  occlusion of LAD, treated with DES. Cath also showed 20% distal LM, 95% ostial-prox small-moderate ramus, 70-80% ostial Cx, 70% dominant ostial OM, 50-60% prox Cx, 70% RCA. EF 35% by cath with LVEDP 96mHg. Med rx for residual disease. Course complicated by post MI pericarditis and LV thrombus.  ? Chronic systolic (congestive) heart failure (HCC)   ? Colitis   ? Colon polyp   ? inflammatory  ? Crohn disease ([place] 1992  ? history uveitis, involvement of intestines and lungs  ? Diverticulosis   ? Eosinophilic [place] ([place]   ? Essential hypertension   ? GERD (gastroesophageal reflux disease)   ? GI bleeding   ? History of chicken pox   ? History of gastroesophageal reflux (GERD)   ? History of seizure 1995  ? grand mal x1, completed 6 yrs dilantin. no seizures since  ? Hyperlipidemia   ? Internal hemorrhoids   ? Ischemic cardiomyopathy   ? LV (left ventricular) mural thrombus following MI ([facility] 01/2018  ? [facility] ([facility]   ? Osteoporosis 11/2015  ? DEXA T -2.9  ? Schwannoma 2007  ? L axilla s/p surgery  ? [place]izures ([place]   ? last seizure 1995 and only x 1 seizure-   ? Ulnar neuropathy   ? h/o this from L arm schwannoma  ? ? ?Past Surgical History:  ?Procedure Laterality Date  ? APPENDECTOMY  2000  ? BACK SURGERY  2011  ? lumbar  ?  CARDIAC CATHETERIZATION    ? CARDIOVASCULAR STRESS TEST  01/2015  ? low risk study  ? CHOLECYSTECTOMY  2000  ? COLONOSCOPY  08/2014  ? f/u crohn's, 4 inflammatory polyps, diverticulosis, improved, rpt 2 yrs ([place])  ? COLONOSCOPY  11/2018  ? multiple polyps, 1 TA, rpt 2 yrs ([place])  ? COLONOSCOPY WITH PROPOFOL N/A 04/09/2018  ? inflammatory polyp, f/u left to primary GI - [place] (Danis, Kirke Corin, MD)  ? CORONARY ANGIOGRAPHY N/A 02/21/2018  ? Procedure: CORONARY ANGIOGRAPHY;  Surgeon: Belva Crome, MD;  Location: [place] CV LAB;  Service: Cardiovascular;  Laterality: N/A;  ? CORONARY/GRAFT ACUTE MI REVASCULARIZATION N/A 02/20/2018  ? Procedure: Coronary/Graft Acute MI  Revascularization;  Surgeon: Belva Crome, MD;  Location: [place] CV LAB;  Service: Cardiovascular;  Laterality: N/A;  ? ESOPHAGOGASTRODUODENOSCOPY  07/2014  ? h/o EE resolved, focal reflux esophagitis, chronic active gastritis  ? ESOPHAGOGASTRODUODENOSCOPY  11/2018  ? gastritis, no active crohn's ([place])  ? extremity surgery Left   ? ulnar nerve repair after schwannoma removal  ? FLEXIBLE SIGMOIDOSCOPY N/A 04/14/2018  ? Procedure: FLEXIBLE SIGMOIDOSCOPY;  Surgeon: Milus Banister, MD;  Location: [facility] ENDOSCOPY;  Service: Endoscopy;  Laterality: N/A;  ? INGUINAL HERNIA REPAIR Bilateral 2017  ? LAMINECTOMY N/A 08/09/2018  ? Procedure: Lumbar three Laminectomy, excision of intradural tumor;  Surgeon: Kristeen Miss, MD;  Location: [place];  Service: Neurosurgery;  Laterality: N/A;  ? LEFT HEART CATH AND CORONARY ANGIOGRAPHY N/A 02/20/2018  ? Procedure: LEFT HEART CATH AND CORONARY ANGIOGRAPHY;  Surgeon: Belva Crome, MD;  Location: [place] CV LAB;  Service: Cardiovascular;  Laterality: N/A;  ? LUMBAR SPINE SURGERY  2011  ? POLYPECTOMY  04/09/2018  ? Procedure: POLYPECTOMY;  Surgeon: Doran Stabler, MD;  Location: [facility] ENDOSCOPY;  Service: Gastroenterology;;  ? SPINAL CORD STIMULATOR IMPLANT  2012  ? x2 (Dr Berton Mount)  ? SUBMUCOSAL INJECTION  04/09/2018  ? Procedure: SUBMUCOSAL INJECTION;  Surgeon: Doran Stabler, MD;  Location: [facility] ENDOSCOPY;  Service: Gastroenterology;;  ? TONSILLECTOMY  1996  ? TUMOR REMOVAL Left 2007  ? schwannoma from L armpit  ? UPPER GASTROINTESTINAL ENDOSCOPY    ? ? ?Current Medications: ?Current Meds  ?Medication Sig  ? acetaminophen (TYLENOL) 325 MG tablet Take 650 mg by mouth every 6 (six) hours as needed for mild pain or headache.   ? alendronate (FOSAMAX) 70 MG tablet Take 70 mg by mouth every Saturday.   ? amitriptyline (ELAVIL) 25 MG tablet TAKE 1 TABLET (25 MG TOTAL) BY MOUTH AT BEDTIME AS NEEDED FOR SLEEP.  ? atorvastatin (LIPITOR) 80 MG tablet TAKE 1 TABLET BY MOUTH DAILY AT 6  PM.  ? azaTHIOprine (IMURAN) 50 MG tablet TAKE 3 TABLETS BY MOUTH EVERY DAY  ? Calcium Carbonate-Vitamin D3 (CALCIUM 600/VITAMIN D) 600-400 MG-UNIT TABS Take 1 tablet by mouth daily.  ? carvedilol (COREG) 6.25 MG tablet TAKE 1 TABLET BY MOUTH TWICE A DAY  ? clopidogrel (PLAVIX) 75 MG tablet TAKE 1 TABLET BY MOUTH EVERY DAY  ? dapagliflozin propanediol (FARXIGA) 10 MG TABS tablet Take 1 tablet (10 mg total) by mouth daily before breakfast.  ? diphenoxylate-atropine (LOMOTIL) 2.5-0.025 MG tablet TAKE 1-2 TABLETS BY MOUTH 4 TIMES A DAY AS NEEDED FOR DIARRHEA  ? ENTRESTO 24-26 MG TAKE 1 TABLET BY MOUTH 2 (TWO) TIMES DAILY.  ? ezetimibe (ZETIA) 10 MG tablet TAKE 1 TABLET BY MOUTH EVERY DAY  ? finasteride (PROSCAR) 5 MG tablet Take 1 tablet (  5 mg total) by mouth daily.  ? icosapent Ethyl (VASCEPA) 1 g capsule TAKE 2 CAPSULES BY MOUTH 2 TIMES DAILY.  ? Iron, Ferrous Sulfate, 325 (65 Fe) MG TABS Take 1 tablet by mouth daily.  ? meloxicam (MOBIC) 7.5 MG tablet Take 7.5 mg by mouth daily.  ? Multiple Vitamin (MULTIVITAMIN) capsule Take 1 capsule by mouth daily.  ? nitroGLYCERIN (NITROSTAT) 0.4 MG SL tablet Place 1 tablet (0.4 mg total) under the tongue every 5 (five) minutes as needed for chest pain.  ? pantoprazole (PROTONIX) 40 MG tablet TAKE 1 TABLET BY MOUTH EVERY DAY  ? predniSONE (DELTASONE) 10 MG tablet Take as directed (Currently taking 21 mg daily- decrease by 1 mg as tolerated)  ? predniSONE (DELTASONE) 5 MG tablet Take as directed. (currently on 21 mg daily- decrease by 1 mg as tolerated)  ? silodosin (RAPAFLO) 8 MG CAPS capsule Take 1 capsule (8 mg total) by mouth daily with breakfast.  ? spironolactone (ALDACTONE) 25 MG tablet Take 0.5 tablets (12.5 mg total) by mouth every Monday, Wednesday, and Friday.  ? tadalafil (CIALIS) 20 MG tablet Take 0.5-1 tablets (10-20 mg total) by mouth daily as needed for erectile dysfunction.  ? vedolizumab (ENTYVIO) 300 MG injection Inject 300 mg into the vein. Every 4 weeks  ?   ? ?Allergies:   Cephalexin and Duloxetine  ? ?Social History  ? ?Socioeconomic History  ? Marital status: Married  ?  Spouse name: Mickel Baas  ? Number of children: 2  ? Years of education: 75  ? Highest educatio

## 2021-07-23 ENCOUNTER — Ambulatory Visit: Payer: BC Managed Care – PPO | Admitting: Interventional Cardiology

## 2021-07-24 ENCOUNTER — Encounter: Payer: Self-pay | Admitting: Interventional Cardiology

## 2021-07-24 ENCOUNTER — Ambulatory Visit: Payer: BC Managed Care – PPO | Admitting: Interventional Cardiology

## 2021-07-24 VITALS — BP 94/55 | HR 68 | Ht 67.0 in | Wt 171.4 lb

## 2021-07-24 DIAGNOSIS — G4733 Obstructive sleep apnea (adult) (pediatric): Secondary | ICD-10-CM

## 2021-07-24 DIAGNOSIS — I10 Essential (primary) hypertension: Secondary | ICD-10-CM

## 2021-07-24 DIAGNOSIS — Z7901 Long term (current) use of anticoagulants: Secondary | ICD-10-CM

## 2021-07-24 DIAGNOSIS — I5022 Chronic systolic (congestive) heart failure: Secondary | ICD-10-CM | POA: Diagnosis not present

## 2021-07-24 DIAGNOSIS — E785 Hyperlipidemia, unspecified: Secondary | ICD-10-CM | POA: Diagnosis not present

## 2021-07-24 DIAGNOSIS — I2510 Atherosclerotic heart disease of native coronary artery without angina pectoris: Secondary | ICD-10-CM | POA: Diagnosis not present

## 2021-07-24 DIAGNOSIS — Z01812 Encounter for preprocedural laboratory examination: Secondary | ICD-10-CM

## 2021-07-24 LAB — CBC
Hematocrit: 42 % (ref 37.5–51.0)
Hemoglobin: 13.6 g/dL (ref 13.0–17.7)
MCH: 31.4 pg (ref 26.6–33.0)
MCHC: 32.4 g/dL (ref 31.5–35.7)
MCV: 97 fL (ref 79–97)
Platelets: 243 10*3/uL (ref 150–450)
RBC: 4.33 x10E6/uL (ref 4.14–5.80)
RDW: 14 % (ref 11.6–15.4)
WBC: 11.4 10*3/uL — ABNORMAL HIGH (ref 3.4–10.8)

## 2021-07-24 MED ORDER — ENTRESTO 24-26 MG PO TABS: 1.0000 | ORAL_TABLET | Freq: Two times a day (BID) | ORAL | 3 refills | Status: DC

## 2021-07-24 NOTE — Patient Instructions (Signed)
Medication Instructions:  ?Your physician recommends that you continue on your current medications as directed. Please refer to the Current Medication list given to you today.  ?*If you need a refill on your cardiac medications before your next appointment, please call your pharmacy* ? ?Lab Work: ?TODAY: CBC ?If you have labs (blood work) drawn today and your tests are completely normal, you will receive your results only by: ?MyChart Message (if you have MyChart) OR ?A paper copy in the mail ?If you have any lab test that is abnormal or we need to change your treatment, we will call you to review the results. ? ?Testing/Procedures: ?Your physician has recommended for you to have a left heart cath. ? ?Follow-Up: ?At [facility], you and your health needs are our priority.  As part of our continuing mission to provide you with exceptional heart care, we have created designated Provider Care Teams.  These Care Teams include your primary Cardiologist (physician) and Advanced Practice Providers (APPs -  Physician Assistants and Nurse Practitioners) who all work together to provide you with the care you need, when you need it. ? ?Your next appointment:   ?6 month(s) ? ?The format for your next appointment:   ?In Person ? ?Provider:   ?Sinclair Grooms, MD   ? ? ?Other Instructions ? ?[name] ?[place] OFFICE ?[address], [address] ?[place] [address] ?Dept: [phone] ?Loc: [phone] ? ?Jack [name]  07/24/2021 ? ?You are scheduled for a Cardiac Catheterization on Tuesday, May 9 with Dr. Daneen Schick. ? ?1. Please arrive at the Main Entrance A at [facility]: [place], [place] [address] at 7:00 AM (This time is two hours before your procedure to ensure your preparation). Free valet parking service is available.  ? ?Special note: Every effort is made to have your procedure done on time. Please understand that  emergencies sometimes delay scheduled procedures. ? ?2. Diet: Do not eat solid foods after midnight.  You may have clear liquids until 5 AM upon the day of the procedure. ? ?3. Labs: You will need to have blood drawn today: CBC ? ?4. Medication instructions in preparation for your procedure: ? ? Contrast Allergy: No ? ?Do not take your Entresto the night before or morning of your procedure. Do not take your spironolactone the morning of your procedure. ? ?On the morning of your procedure, take Aspirin and Plavix/Clopidogrel and any morning medicines NOT listed above.  You may use sips of water. ? ?5. Plan to go home the same day, you will only stay overnight if medically necessary. ?6. You MUST have a responsible adult to drive you home. ?7. An adult MUST be with you the first 24 hours after you arrive home. ?8. Bring a current list of your medications, and the last time and date medication taken. ?9. Bring ID and current insurance cards. ?10.Please wear clothes that are easy to get on and off and wear slip-on shoes. ? ?Thank you for allowing [place] to care for you! ?  -- [facility]  ? ?Important Information About Sugar ? ? ? ? ?  ?

## 2021-07-25 ENCOUNTER — Other Ambulatory Visit: Payer: Self-pay | Admitting: Interventional Cardiology

## 2021-07-25 ENCOUNTER — Other Ambulatory Visit: Payer: Self-pay | Admitting: Internal Medicine

## 2021-07-29 ENCOUNTER — Encounter: Payer: BC Managed Care – PPO | Admitting: Family Medicine

## 2021-08-05 ENCOUNTER — Telehealth: Payer: Self-pay | Admitting: *Deleted

## 2021-08-05 DIAGNOSIS — K5090 Crohn's disease, unspecified, without complications: Secondary | ICD-10-CM | POA: Diagnosis not present

## 2021-08-05 NOTE — Telephone Encounter (Addendum)
Cardiac Catheterization scheduled at [facility] for: Tuesday Aug 06, 2021 9 AM ?Arrival time and place: [place] Entrance A at: 7 AM ? ? ?No solid food after midnight prior to cath, clear liquids until 5 AM day of procedure. ? ?Medication instructions: ?-Hold: ? Farxiga -AM of procedure ? Entresto-PM prior/AM of procedure per 07/24/21 instructions ?-Except hold medications usual morning medications can be taken with sips of water including aspirin 81 mg and Plavix 75 mg. ? ?Confirmed patient has responsible adult to drive home post procedure and be with patient first 24 hours after arriving home. ? ?Patient reports no new symptoms concerning for COVID-19/no exposure to COVID-19 in the past 10 days. ? ?Reviewed procedure instructions with patient's wife (DPR).  ?

## 2021-08-06 ENCOUNTER — Other Ambulatory Visit: Payer: Self-pay

## 2021-08-06 ENCOUNTER — Ambulatory Visit (HOSPITAL_COMMUNITY)
Admission: RE | Disposition: A | Discharge status: Home / Self Care | Payer: BC Managed Care – PPO | Source: Home / Self Care | Attending: Interventional Cardiology

## 2021-08-06 ENCOUNTER — Ambulatory Visit (HOSPITAL_COMMUNITY)
Admission: RE | Admit: 2021-08-06 | Discharge: 2021-08-07 | Disposition: A | Discharge status: Home / Self Care | Payer: BC Managed Care – PPO | Attending: Interventional Cardiology | Admitting: Interventional Cardiology

## 2021-08-06 DIAGNOSIS — E785 Hyperlipidemia, unspecified: Secondary | ICD-10-CM | POA: Diagnosis not present

## 2021-08-06 DIAGNOSIS — Z79899 Other long term (current) drug therapy: Secondary | ICD-10-CM | POA: Insufficient documentation

## 2021-08-06 DIAGNOSIS — I25110 Atherosclerotic heart disease of native coronary artery with unstable angina pectoris: Secondary | ICD-10-CM

## 2021-08-06 DIAGNOSIS — I5022 Chronic systolic (congestive) heart failure: Secondary | ICD-10-CM | POA: Insufficient documentation

## 2021-08-06 DIAGNOSIS — I252 Old myocardial infarction: Secondary | ICD-10-CM | POA: Insufficient documentation

## 2021-08-06 DIAGNOSIS — Z7901 Long term (current) use of anticoagulants: Secondary | ICD-10-CM | POA: Diagnosis not present

## 2021-08-06 DIAGNOSIS — Z87891 Personal history of nicotine dependence: Secondary | ICD-10-CM | POA: Diagnosis not present

## 2021-08-06 DIAGNOSIS — I25119 Atherosclerotic heart disease of native coronary artery with unspecified angina pectoris: Secondary | ICD-10-CM | POA: Insufficient documentation

## 2021-08-06 DIAGNOSIS — I5020 Unspecified systolic (congestive) heart failure: Secondary | ICD-10-CM | POA: Diagnosis present

## 2021-08-06 DIAGNOSIS — Z955 Presence of coronary angioplasty implant and graft: Secondary | ICD-10-CM | POA: Diagnosis not present

## 2021-08-06 DIAGNOSIS — G4733 Obstructive sleep apnea (adult) (pediatric): Secondary | ICD-10-CM | POA: Insufficient documentation

## 2021-08-06 DIAGNOSIS — I255 Ischemic cardiomyopathy: Secondary | ICD-10-CM | POA: Diagnosis not present

## 2021-08-06 DIAGNOSIS — I209 Angina pectoris, unspecified: Secondary | ICD-10-CM | POA: Diagnosis present

## 2021-08-06 DIAGNOSIS — K922 Gastrointestinal hemorrhage, unspecified: Secondary | ICD-10-CM | POA: Diagnosis present

## 2021-08-06 DIAGNOSIS — I2510 Atherosclerotic heart disease of native coronary artery without angina pectoris: Secondary | ICD-10-CM | POA: Diagnosis present

## 2021-08-06 DIAGNOSIS — I77819 Aortic ectasia, unspecified site: Secondary | ICD-10-CM | POA: Diagnosis present

## 2021-08-06 DIAGNOSIS — I110 Hypertensive heart disease with heart failure: Secondary | ICD-10-CM | POA: Insufficient documentation

## 2021-08-06 DIAGNOSIS — Z95820 Peripheral vascular angioplasty status with implants and grafts: Secondary | ICD-10-CM

## 2021-08-06 HISTORY — PX: CORONARY STENT INTERVENTION: CATH118234

## 2021-08-06 HISTORY — PX: LEFT HEART CATH AND CORONARY ANGIOGRAPHY: CATH118249

## 2021-08-06 LAB — POCT ACTIVATED CLOTTING TIME
Activated Clotting Time: 311 seconds
Activated Clotting Time: 780 seconds

## 2021-08-06 SURGERY — LEFT HEART CATH AND CORONARY ANGIOGRAPHY
Anesthesia: LOCAL

## 2021-08-06 MED ORDER — SODIUM CHLORIDE 0.9% FLUSH: 3.0000 mL | INTRAVENOUS | Status: DC | PRN

## 2021-08-06 MED ORDER — LABETALOL HCL 5 MG/ML IV SOLN: 10.0000 mg | INTRAVENOUS | Status: AC | PRN

## 2021-08-06 MED ORDER — PREDNISONE 5 MG PO TABS: 5.0000 mg | ORAL_TABLET | Freq: Every day | ORAL | Status: DC

## 2021-08-06 MED ORDER — DAPAGLIFLOZIN PROPANEDIOL 10 MG PO TABS
10.0000 mg | ORAL_TABLET | Freq: Every day | ORAL | Status: DC
Start: 2021-08-07 — End: 2021-08-07
  Administered 2021-08-07: 10 mg via ORAL
  Filled 2021-08-06: qty 1

## 2021-08-06 MED ORDER — DIPHENOXYLATE-ATROPINE 2.5-0.025 MG PO TABS: 1.0000 | ORAL_TABLET | Freq: Every morning | ORAL | Status: DC

## 2021-08-06 MED ORDER — LIDOCAINE HCL (PF) 1 % IJ SOLN
INTRAMUSCULAR | Status: DC | PRN
  Administered 2021-08-06 (×2): 2 mL

## 2021-08-06 MED ORDER — FENTANYL CITRATE (PF) 100 MCG/2ML IJ SOLN
INTRAMUSCULAR | Status: DC | PRN
Start: 2021-08-06 — End: 2021-08-06
  Administered 2021-08-06 (×2): 25 ug via INTRAVENOUS

## 2021-08-06 MED ORDER — HEPARIN SODIUM (PORCINE) 5000 UNIT/ML IJ SOLN
5000.0000 [IU] | Freq: Three times a day (TID) | INTRAMUSCULAR | Status: DC
  Administered 2021-08-06 – 2021-08-07 (×2): 5000 [IU] via SUBCUTANEOUS
  Filled 2021-08-06 (×2): qty 1

## 2021-08-06 MED ORDER — NITROGLYCERIN 1 MG/10 ML FOR IR/CATH LAB
INTRA_ARTERIAL | Status: AC
  Filled 2021-08-06: qty 10

## 2021-08-06 MED ORDER — FENTANYL CITRATE (PF) 100 MCG/2ML IJ SOLN
INTRAMUSCULAR | Status: AC
  Filled 2021-08-06: qty 2

## 2021-08-06 MED ORDER — AMITRIPTYLINE HCL 25 MG PO TABS: 25.0000 mg | ORAL_TABLET | Freq: Every evening | ORAL | Status: DC | PRN

## 2021-08-06 MED ORDER — SODIUM CHLORIDE 0.9 % IV SOLN: 250.0000 mL | INTRAVENOUS | Status: DC | PRN

## 2021-08-06 MED ORDER — MELATONIN 3 MG PO TABS
3.0000 mg | ORAL_TABLET | Freq: Every day | ORAL | Status: DC
  Administered 2021-08-06: 3 mg via ORAL
  Filled 2021-08-06: qty 1

## 2021-08-06 MED ORDER — EZETIMIBE 10 MG PO TABS
10.0000 mg | ORAL_TABLET | Freq: Every day | ORAL | Status: DC
Start: 2021-08-06 — End: 2021-08-07
  Administered 2021-08-06 – 2021-08-07 (×2): 10 mg via ORAL
  Filled 2021-08-06 (×2): qty 1

## 2021-08-06 MED ORDER — ICOSAPENT ETHYL 1 G PO CAPS
2.0000 g | ORAL_CAPSULE | Freq: Two times a day (BID) | ORAL | Status: DC
  Administered 2021-08-06 – 2021-08-07 (×2): 2 g via ORAL
  Filled 2021-08-06 (×3): qty 2

## 2021-08-06 MED ORDER — HEPARIN SODIUM (PORCINE) 1000 UNIT/ML IJ SOLN
INTRAMUSCULAR | Status: AC
  Filled 2021-08-06: qty 10

## 2021-08-06 MED ORDER — MIDAZOLAM HCL 2 MG/2ML IJ SOLN
INTRAMUSCULAR | Status: AC
  Filled 2021-08-06: qty 2

## 2021-08-06 MED ORDER — SODIUM CHLORIDE 0.9 % IV SOLN: INTRAVENOUS | Status: AC

## 2021-08-06 MED ORDER — DIPHENOXYLATE-ATROPINE 2.5-0.025 MG PO TABS: 1.0000 | ORAL_TABLET | Freq: Four times a day (QID) | ORAL | Status: DC | PRN

## 2021-08-06 MED ORDER — LIDOCAINE HCL (PF) 1 % IJ SOLN
INTRAMUSCULAR | Status: AC
  Filled 2021-08-06: qty 30

## 2021-08-06 MED ORDER — IOHEXOL 350 MG/ML SOLN
INTRAVENOUS | Status: DC | PRN
  Administered 2021-08-06: 160 mL

## 2021-08-06 MED ORDER — SODIUM CHLORIDE 0.9% FLUSH
3.0000 mL | Freq: Two times a day (BID) | INTRAVENOUS | Status: DC
  Administered 2021-08-06 – 2021-08-07 (×2): 3 mL via INTRAVENOUS

## 2021-08-06 MED ORDER — TAMSULOSIN HCL 0.4 MG PO CAPS
0.4000 mg | ORAL_CAPSULE | Freq: Every day | ORAL | Status: DC
  Filled 2021-08-06: qty 1

## 2021-08-06 MED ORDER — NITROGLYCERIN 0.4 MG SL SUBL
SUBLINGUAL_TABLET | SUBLINGUAL | Status: AC
  Filled 2021-08-06: qty 1

## 2021-08-06 MED ORDER — SODIUM CHLORIDE 0.9% FLUSH
3.0000 mL | Freq: Two times a day (BID) | INTRAVENOUS | Status: DC
  Administered 2021-08-07: 3 mL via INTRAVENOUS

## 2021-08-06 MED ORDER — HEPARIN SODIUM (PORCINE) 1000 UNIT/ML IJ SOLN
INTRAMUSCULAR | Status: DC | PRN
  Administered 2021-08-06: 6000 [IU] via INTRAVENOUS
  Administered 2021-08-06: 4000 [IU] via INTRAVENOUS

## 2021-08-06 MED ORDER — VERAPAMIL HCL 2.5 MG/ML IV SOLN
INTRAVENOUS | Status: AC
  Filled 2021-08-06: qty 2

## 2021-08-06 MED ORDER — ACETAMINOPHEN 325 MG PO TABS: 650.0000 mg | ORAL_TABLET | ORAL | Status: DC | PRN

## 2021-08-06 MED ORDER — ASPIRIN 81 MG PO CHEW: 81.0000 mg | CHEWABLE_TABLET | ORAL | Status: DC

## 2021-08-06 MED ORDER — SODIUM CHLORIDE 0.9 % IV SOLN: INTRAVENOUS | Status: DC

## 2021-08-06 MED ORDER — ATROPINE SULFATE 1 MG/10ML IJ SOSY
PREFILLED_SYRINGE | INTRAMUSCULAR | Status: AC
  Filled 2021-08-06: qty 10

## 2021-08-06 MED ORDER — ONDANSETRON HCL 4 MG/2ML IJ SOLN: 4.0000 mg | Freq: Four times a day (QID) | INTRAMUSCULAR | Status: DC | PRN

## 2021-08-06 MED ORDER — CLOPIDOGREL BISULFATE 300 MG PO TABS
ORAL_TABLET | ORAL | Status: DC | PRN
  Administered 2021-08-06: 300 mg via ORAL

## 2021-08-06 MED ORDER — NITROGLYCERIN 0.4 MG SL SUBL
SUBLINGUAL_TABLET | SUBLINGUAL | Status: DC | PRN
  Administered 2021-08-06: .4 mg via SUBLINGUAL

## 2021-08-06 MED ORDER — PREDNISONE 5 MG PO TABS
25.0000 mg | ORAL_TABLET | Freq: Every day | ORAL | Status: DC
  Administered 2021-08-07: 25 mg via ORAL
  Filled 2021-08-06: qty 1

## 2021-08-06 MED ORDER — HEPARIN (PORCINE) IN NACL 1000-0.9 UT/500ML-% IV SOLN
INTRAVENOUS | Status: DC | PRN
Start: 2021-08-06 — End: 2021-08-06
  Administered 2021-08-06 (×2): 500 mL

## 2021-08-06 MED ORDER — ATROPINE SULFATE 1 MG/10ML IJ SOSY
PREFILLED_SYRINGE | INTRAMUSCULAR | Status: DC | PRN
  Administered 2021-08-06: .6 mg via INTRAVENOUS

## 2021-08-06 MED ORDER — PREDNISONE 20 MG PO TABS
20.0000 mg | ORAL_TABLET | Freq: Every day | ORAL | Status: DC
Start: 2021-08-06 — End: 2021-08-06

## 2021-08-06 MED ORDER — CLOPIDOGREL BISULFATE 75 MG PO TABS
75.0000 mg | ORAL_TABLET | Freq: Every day | ORAL | Status: DC
  Administered 2021-08-07: 75 mg via ORAL
  Filled 2021-08-06: qty 1

## 2021-08-06 MED ORDER — HYDRALAZINE HCL 20 MG/ML IJ SOLN: 10.0000 mg | INTRAMUSCULAR | Status: AC | PRN

## 2021-08-06 MED ORDER — ATORVASTATIN CALCIUM 80 MG PO TABS
80.0000 mg | ORAL_TABLET | Freq: Every day | ORAL | Status: DC
  Administered 2021-08-06 – 2021-08-07 (×2): 80 mg via ORAL
  Filled 2021-08-06 (×2): qty 1

## 2021-08-06 MED ORDER — HEPARIN (PORCINE) IN NACL 1000-0.9 UT/500ML-% IV SOLN
INTRAVENOUS | Status: AC
  Filled 2021-08-06: qty 500

## 2021-08-06 MED ORDER — OXYCODONE HCL 5 MG PO TABS: 5.0000 mg | ORAL_TABLET | ORAL | Status: DC | PRN

## 2021-08-06 MED ORDER — ASPIRIN 81 MG PO CHEW
81.0000 mg | CHEWABLE_TABLET | Freq: Every day | ORAL | Status: DC
  Administered 2021-08-07: 81 mg via ORAL
  Filled 2021-08-06: qty 1

## 2021-08-06 MED ORDER — CLOPIDOGREL BISULFATE 300 MG PO TABS
ORAL_TABLET | ORAL | Status: AC
  Filled 2021-08-06: qty 1

## 2021-08-06 MED ORDER — PANTOPRAZOLE SODIUM 40 MG PO TBEC
40.0000 mg | DELAYED_RELEASE_TABLET | Freq: Every day | ORAL | Status: DC
  Administered 2021-08-07: 40 mg via ORAL
  Filled 2021-08-06: qty 1

## 2021-08-06 MED ORDER — MIDAZOLAM HCL 2 MG/2ML IJ SOLN
INTRAMUSCULAR | Status: DC | PRN
  Administered 2021-08-06: .5 mg via INTRAVENOUS
  Administered 2021-08-06 (×2): 1 mg via INTRAVENOUS

## 2021-08-06 MED ORDER — CARVEDILOL 3.125 MG PO TABS
6.2500 mg | ORAL_TABLET | Freq: Two times a day (BID) | ORAL | Status: DC
Start: 2021-08-06 — End: 2021-08-07
  Administered 2021-08-06 – 2021-08-07 (×2): 6.25 mg via ORAL
  Filled 2021-08-06 (×2): qty 2

## 2021-08-06 MED ORDER — HEPARIN (PORCINE) IN NACL 2-0.9 UNITS/ML
INTRAMUSCULAR | Status: DC | PRN
  Administered 2021-08-06: 10 mL via INTRA_ARTERIAL

## 2021-08-06 MED ORDER — CLOPIDOGREL BISULFATE 75 MG PO TABS: 75.0000 mg | ORAL_TABLET | ORAL | Status: DC

## 2021-08-06 SURGICAL SUPPLY — 21 items
BALL SAPPHIRE NC24 4.0X15 (BALLOONS) ×2
BALLN SAPPHIRE 2.5X15 (BALLOONS) ×2
BALLOON SAPPHIRE 2.5X15 (BALLOONS) IMPLANT
BALLOON SAPPHIRE NC24 4.0X15 (BALLOONS) IMPLANT
CATH INFINITI 5FR MULTPACK ANG (CATHETERS) ×1 IMPLANT
CATH JL3.5 FR DIAG (CATHETERS) ×1 IMPLANT
CATH LAUNCHER 5F EBU3.5 (CATHETERS) ×1 IMPLANT
CATH LAUNCHER 6FR JR4 (CATHETERS) ×1 IMPLANT
DEVICE RAD COMP TR BAND LRG (VASCULAR PRODUCTS) ×1 IMPLANT
ELECT DEFIB PAD ADLT CADENCE (PAD) ×1 IMPLANT
GLIDESHEATH SLEND A-KIT 6F 22G (SHEATH) ×1 IMPLANT
GUIDEWIRE INQWIRE 1.5J.035X260 (WIRE) IMPLANT
INQWIRE 1.5J .035X260CM (WIRE) ×2
KIT ENCORE 26 ADVANTAGE (KITS) ×1 IMPLANT
KIT HEART LEFT (KITS) ×2 IMPLANT
PACK CARDIAC CATHETERIZATION (CUSTOM PROCEDURE TRAY) ×2 IMPLANT
SHEATH PROBE COVER 6X72 (BAG) ×1 IMPLANT
STENT ONYX FRONTIER 3.5X26 (Permanent Stent) ×1 IMPLANT
TRANSDUCER W/STOPCOCK (MISCELLANEOUS) ×2 IMPLANT
TUBING CIL FLEX 10 FLL-RA (TUBING) ×2 IMPLANT
WIRE ASAHI PROWATER 180CM (WIRE) ×1 IMPLANT

## 2021-08-06 NOTE — CV Procedure (Signed)
99% mid to distal RCA with competitive flow from left coronary ?Ostial proximal 40% LAD.  Widely patent proximal to mid LAD stent.  Ostial 50% circumflex.  50% mid circumflex.  60% ostial obtuse marginal 1. ?Severe mid anterior wall hypokinesis/akinesis.  LVEDP 7 mmHg. ?Spontaneous occlusion of the right coronary during left coronary angiography with resultant ST elevation, bradycardia, and decreased systolic blood pressure to 70 mmHg systolic. ?Successful stent mid RCA using a 3.5 x 26 Onyx postdilated to 4.0 with TIMI grade III flow. ?He is on chronic Plavix and aspirin therapy.  Was loaded with an additional 300 mg of Plavix despite having taken Plavix this morning. ?Since he had spontaneous occlusion, will remain in hospital overnight and be discharged tomorrow. ?

## 2021-08-06 NOTE — Interval H&P Note (Signed)
Cath Lab Visit (complete for each Cath Lab visit) ? ?Clinical Evaluation Leading to the Procedure:  ? ?ACS: No. ? ?Non-ACS:   ? ?Anginal Classification: CCS III ? ?Anti-ischemic medical therapy: Maximal Therapy (2 or more classes of medications) ? ?Non-Invasive Test Results: Intermediate-risk stress test findings: cardiac mortality 1-3%/year ? ?Prior CABG: No previous CABG ? ? ? ? ? ?History and Physical Interval Note: ? ?08/06/2021 ?10:14 AM ? ?Jack [name]  has presented today for surgery, with the diagnosis of chest pain.  The various methods of treatment have been discussed with the patient and family. After consideration of risks, benefits and other options for treatment, the patient has consented to  Procedure(s): ?LEFT HEART CATH AND CORONARY ANGIOGRAPHY (N/A) as a surgical intervention.  The patient's history has been reviewed, patient examined, no change in status, stable for surgery.  I have reviewed the patient's chart and labs.  Questions were answered to the patient's satisfaction.   ? ? ?Belva Crome III ? ? ?

## 2021-08-07 ENCOUNTER — Encounter (HOSPITAL_COMMUNITY): Payer: Self-pay | Admitting: Interventional Cardiology

## 2021-08-07 DIAGNOSIS — I110 Hypertensive heart disease with heart failure: Secondary | ICD-10-CM | POA: Diagnosis not present

## 2021-08-07 DIAGNOSIS — I25118 Atherosclerotic heart disease of native coronary artery with other forms of angina pectoris: Secondary | ICD-10-CM

## 2021-08-07 DIAGNOSIS — I5020 Unspecified systolic (congestive) heart failure: Secondary | ICD-10-CM | POA: Diagnosis not present

## 2021-08-07 DIAGNOSIS — I5022 Chronic systolic (congestive) heart failure: Secondary | ICD-10-CM | POA: Diagnosis not present

## 2021-08-07 DIAGNOSIS — G4733 Obstructive sleep apnea (adult) (pediatric): Secondary | ICD-10-CM | POA: Diagnosis not present

## 2021-08-07 DIAGNOSIS — Z955 Presence of coronary angioplasty implant and graft: Secondary | ICD-10-CM | POA: Diagnosis not present

## 2021-08-07 DIAGNOSIS — Z87891 Personal history of nicotine dependence: Secondary | ICD-10-CM | POA: Diagnosis not present

## 2021-08-07 DIAGNOSIS — I252 Old myocardial infarction: Secondary | ICD-10-CM | POA: Diagnosis not present

## 2021-08-07 DIAGNOSIS — E785 Hyperlipidemia, unspecified: Secondary | ICD-10-CM | POA: Diagnosis not present

## 2021-08-07 DIAGNOSIS — I25119 Atherosclerotic heart disease of native coronary artery with unspecified angina pectoris: Secondary | ICD-10-CM | POA: Diagnosis not present

## 2021-08-07 DIAGNOSIS — Z7901 Long term (current) use of anticoagulants: Secondary | ICD-10-CM | POA: Diagnosis not present

## 2021-08-07 DIAGNOSIS — I209 Angina pectoris, unspecified: Secondary | ICD-10-CM | POA: Diagnosis not present

## 2021-08-07 DIAGNOSIS — Z95820 Peripheral vascular angioplasty status with implants and grafts: Secondary | ICD-10-CM

## 2021-08-07 DIAGNOSIS — I255 Ischemic cardiomyopathy: Secondary | ICD-10-CM | POA: Diagnosis not present

## 2021-08-07 DIAGNOSIS — Z79899 Other long term (current) drug therapy: Secondary | ICD-10-CM | POA: Diagnosis not present

## 2021-08-07 HISTORY — DX: Chronic systolic (congestive) heart failure: I50.22

## 2021-08-07 LAB — CBC
HCT: 40.3 % (ref 39.0–52.0)
Hemoglobin: 13.3 g/dL (ref 13.0–17.0)
MCH: 31.5 pg (ref 26.0–34.0)
MCHC: 33 g/dL (ref 30.0–36.0)
MCV: 95.5 fL (ref 80.0–100.0)
Platelets: 223 10*3/uL (ref 150–400)
RBC: 4.22 MIL/uL (ref 4.22–5.81)
RDW: 14.4 % (ref 11.5–15.5)
WBC: 11.1 10*3/uL — ABNORMAL HIGH (ref 4.0–10.5)
nRBC: 0 % (ref 0.0–0.2)

## 2021-08-07 LAB — BASIC METABOLIC PANEL
Anion gap: 4 — ABNORMAL LOW (ref 5–15)
BUN: 19 mg/dL (ref 6–20)
CO2: 25 mmol/L (ref 22–32)
Calcium: 8.1 mg/dL — ABNORMAL LOW (ref 8.9–10.3)
Chloride: 111 mmol/L (ref 98–111)
Creatinine, Ser: 1.13 mg/dL (ref 0.61–1.24)
GFR, Estimated: >60 mL/min (ref >60)
Glucose, Bld: 90 mg/dL (ref 70–99)
Potassium: 3.8 mmol/L (ref 3.5–5.1)
Sodium: 140 mmol/L (ref 135–145)

## 2021-08-07 MED ORDER — ACETAMINOPHEN 325 MG PO TABS: 650.0000 mg | ORAL_TABLET | ORAL | Status: AC | PRN

## 2021-08-07 MED ORDER — ASPIRIN 81 MG PO CHEW: 81.0000 mg | CHEWABLE_TABLET | Freq: Every day | ORAL | Status: DC

## 2021-08-07 MED ORDER — SACUBITRIL-VALSARTAN 24-26 MG PO TABS
1.0000 | ORAL_TABLET | Freq: Two times a day (BID) | ORAL | Status: DC
  Administered 2021-08-07: 1 via ORAL
  Filled 2021-08-07: qty 1

## 2021-08-07 MED ORDER — NITROGLYCERIN 0.4 MG SL SUBL: 0.4000 mg | SUBLINGUAL_TABLET | SUBLINGUAL | 4 refills | Status: AC | PRN

## 2021-08-07 MED FILL — Nitroglycerin IV Soln 100 MCG/ML in D5W: INTRA_ARTERIAL | Qty: 10 | Status: AC

## 2021-08-07 NOTE — Progress Notes (Signed)
? ?Progress Note ? ?Patient Name: Jack [name] ?Date of Encounter: 08/07/2021 ? ?[facility] Cardiologist: Sinclair Grooms, MD  ? ?Subjective  ? ?No chest pain no SOB.  Did not sleep well with hospital rounds ? ?Inpatient Medications  ?  ?Scheduled Meds: ? aspirin  81 mg Oral Daily  ? atorvastatin  80 mg Oral Daily  ? carvedilol  6.25 mg Oral BID WC  ? clopidogrel  75 mg Oral Q breakfast  ? dapagliflozin propanediol  10 mg Oral QAC breakfast  ? ezetimibe  10 mg Oral Daily  ? heparin  5,000 Units Subcutaneous Q8H  ? icosapent Ethyl  2 g Oral BID  ? melatonin  3 mg Oral QHS  ? pantoprazole  40 mg Oral Daily  ? predniSONE  25 mg Oral Q breakfast  ? sodium chloride flush  3 mL Intravenous Q12H  ? sodium chloride flush  3 mL Intravenous Q12H  ? tamsulosin  0.4 mg Oral Daily  ? ?Continuous Infusions: ? sodium chloride    ? ?PRN Meds: ?sodium chloride, acetaminophen, amitriptyline, diphenoxylate-atropine, ondansetron (ZOFRAN) IV, oxyCODONE, sodium chloride flush  ? ?Vital Signs  ?  ?Vitals:  ? 08/06/21 1626 08/06/21 2015 08/07/21 0008 08/07/21 0446  ?BP: 126/86 132/74 120/71 111/66  ?Pulse: 67 60 (!) 55 63  ?Resp:  18 16 17   ?Temp:  97.8 ?F (36.6 ?C) 98 ?F (36.7 ?C) (!) 97.4 ?F (36.3 ?C)  ?TempSrc:  Oral Oral Oral  ?SpO2: 100% 99% 100% 98%  ?Weight: 76.5 kg     ?Height: 5' 7"  (1.702 m)     ? ? ?Intake/Output Summary (Last 24 hours) at 08/07/2021 0735 ?Last data filed at 08/06/2021 2100 ?Gross per 24 hour  ?Intake 2896.62 ml  ?Output 350 ml  ?Net 2546.62 ml  ? ? ?  08/06/2021  ?  4:26 PM 08/06/2021  ?  7:39 AM 07/24/2021  ?  8:56 AM  ?Last 3 Weights  ?Weight (lbs) 168 lb 11.2 oz 165 lb 171 lb 6.4 oz  ?Weight (kg) 76.522 kg 74.844 kg 77.747 kg  ?   ? ?Telemetry  ?  ?SR - Personally Reviewed ? ?ECG  ?  ? SR with no acute st CHanges- Personally Reviewed ? ?Physical Exam  ?  ?GEN: No acute distress.   ?Neck: No JVD ?Cardiac: RRR, no murmurs, rubs, or gallops. Lt wrist without hematoma ?Respiratory: Clear to auscultation  bilaterally. ?GI: Soft, nontender, non-distended  ?MS: No edema; No deformity. ?Neuro:  Nonfocal  ?Psych: Normal affect  ? ?Labs  ?  ?High Sensitivity Troponin:  No results for input(s): TROPONINIHS in the last 720 hours.   ?Chemistry ?Recent Labs  ?Lab 08/07/21 ?0350  ?NA 140  ?K 3.8  ?CL 111  ?CO2 25  ?GLUCOSE 90  ?BUN 19  ?CREATININE 1.13  ?CALCIUM 8.1*  ?GFRNONAA >60  ?ANIONGAP 4*  ?  ?Lipids No results for input(s): CHOL, TRIG, HDL, LABVLDL, LDLCALC, CHOLHDL in the last 168 hours.  ?Hematology ?Recent Labs  ?Lab 08/07/21 ?0350  ?WBC 11.1*  ?RBC 4.22  ?HGB 13.3  ?HCT 40.3  ?MCV 95.5  ?MCH 31.5  ?MCHC 33.0  ?RDW 14.4  ?PLT 223  ? ?Thyroid No results for input(s): TSH, FREET4 in the last 168 hours.  ?BNPNo results for input(s): BNP, PROBNP in the last 168 hours.  ?DDimer No results for input(s): DDIMER in the last 168 hours.  ? ?Radiology  ?  ?CARDIAC CATHETERIZATION ? ?Result Date: 08/06/2021 ?CONCLUSIONS: 99% mid to  distal RCA with TIMI grade II treated with PCI and stent using a 3.5 x 26 Onyx postdilated to 4.0 mm at high pressure and resulting in 0% stenosis and TIMI grade III flow. Left main is patent. Ostial proximal LAD 40 to 50% narrowed Ostial circumflex 50% Mid circumflex and first obtuse marginal [place] 111 stenosis 50%, 50%, 50%. Severe anterior wall hypokinesis.  EF 35 to 40%.  Normal LVEDP, thus findings are consistent with chronic systolic heart failure, ischemic cardiomyopathy. RECOMMENDATIONS: Continue aspirin and Plavix dual antiplatelet therapy for 12 months then return to clopidogrel monotherapy.  He has history of GI bleeding on dual antiplatelet therapy so and tendons will be up for any evidence of bleeding. Continue aggressive lipid-lowering with Zetia, icosapent ethyl, and high intensity statin therapy. Continue guideline directed quadruple therapy for systolic heart failure. Monitor overnight and discharge in a.m. if no complications.   ? ?Cardiac Studies  ? ?Cardiac cath  08/06/21 ?CONCLUSIONS: ?99% mid to distal RCA with TIMI grade II treated with PCI and stent using a 3.5 x 26 Onyx postdilated to 4.0 mm at high pressure and resulting in 0% stenosis and TIMI grade III flow. ?Left main is patent. ?Ostial proximal LAD 40 to 50% narrowed ?Ostial circumflex 50% ?Mid circumflex and first obtuse marginal [place] 111 stenosis 50%, 50%, 50%. ?Severe anterior wall hypokinesis.  EF 35 to 40%.  Normal LVEDP, thus findings are consistent with chronic systolic heart failure, ischemic cardiomyopathy. ?  ?RECOMMENDATIONS: ?  ?Continue aspirin and Plavix dual antiplatelet therapy for 12 months then return to clopidogrel monotherapy.  He has history of GI bleeding on dual antiplatelet therapy so and tendons will be up for any evidence of bleeding. ?Continue aggressive lipid-lowering with Zetia, icosapent ethyl, and high intensity statin therapy. ?Continue guideline directed quadruple therapy for systolic heart failure. ?Monitor overnight and discharge in a.m. if no complications. ? Diagnostic ?Dominance: Right ?Intervention ? ? ? ?Patient Profile  ?   ?56 y.o. male hx of  CAD status post STEMI 11/2017 treated with DES to the LAD with residual moderate CAD in the circumflex and RCA. Developed post infarct pericarditis, decreased LV systolic function EF 26% with LV thrombus requiring Coumadin. Developed a significant GI bleed on Coumadin and dual antiplatelet therapy. Also has essential hypertension, seizure disorder, eosinophilic granulomatosis, Crohn's disease removal of intra-dural meningioma of the lower spine.  now with tightness in upper sternal area that would build in intensity with duration and intensity of physical activity.  Resolves with rest. Medication adjustment did not help.  Nuc study was neg but with ongoing symptoms Dr. Tamala Julian recommended cardiac cath for further eval, define anatomy and guide therapy.  He presented electively for cardiac cath 08/06/21.finding 99% mid to distal RCA   treated with PCI and stent   residual disease in ostial prox LAD 40-50% ostial LCX 50%  EF 35-40% ? ?Assessment & Plan  ?  ?Angina with known CAD prior stent to LAD in 2019 - now with 99% RCA undergoing PCI/DES  ?--plan DAPT for 12 months then plavix alone.  (Hx of GI bleed on DAPT will monitor) ? ?CM with chronic systolic HF on GDMT with Coreg 6.25 BID, farxiga 10, (entresto 24-26 on hold ? Resume   Cr 1.39 prior to procedure now 1.13  )  aldactone 12.5 on MWF ? ?HLD on zetia,   icosapent ethyl, and high intensity statin therapy ?-last lipid panel 06/2020 LDL 47, HDL 35  ?-Lpa in process ? ?Has been on steroids for  Chron's disease takes continuously  ? ?For questions or updates, please contact [place] ?Please consult [email or web address] for contact info under  ? ?  ?   ?Signed, ?Cecilie Kicks, NP  ?08/07/2021, 7:35 AM   ? ?

## 2021-08-07 NOTE — Progress Notes (Signed)
Error

## 2021-08-07 NOTE — Discharge Instructions (Signed)
Call [facility] at [phone] if any bleeding, swelling or drainage at cath site.  May shower, no tub baths for 48 hours for groin sticks. No lifting over 5 pounds for 3 days.  No Driving for 3 days  ? ?Call us if your home BP is <100/60 , check every morning and with symptoms of lightheadedness.  Ok to resume spironolactone tomorrow  ? ?Heart healthy diet low salt.   ? ?Do not stop plavix and asprin, these work together to keep stent open.  ? ?Take 1 NTG, under your tongue, while sitting.  If no relief of pain may repeat NTG, one tab every 5 minutes up to 3 tablets total over 15 minutes.  If no relief CALL 911.  If you have dizziness/lightheadness  while taking NTG, stop taking and call 911.       ? ? ?Do not use Cialis for 4-5 days and then do not use within 72 hours of NTG  ?

## 2021-08-07 NOTE — Progress Notes (Addendum)
Pt pending discharged. Education provided and all questions and concerns addressed. IVs removed, VS as per flow. Shower order placed and Pt to freshen up while awaiting wife for transport. ?

## 2021-08-07 NOTE — Discharge Summary (Signed)
?Discharge Summary  ?  ?Patient ID: Jack [name] ?MRN: [id number]; DOB: 02/26/1966 ? ?Admit date: 08/06/2021 ?Discharge date: 08/07/2021 ? ?PCP:  [name], MD ?  ?[facility] Providers ?Cardiologist:  Sinclair Grooms, MD      ? ? ?Discharge Diagnoses  ?  ?Principal Problem: ?  Angina pectoris ([place]) ?Active Problems: ?  GIB (gastrointestinal bleeding) ?  Aortic dilatation ([facility]) ?  HFrEF (heart failure with reduced ejection fraction) ([place]) ?  CAD (coronary artery disease), native coronary artery ?  Angina pectoris without myocardial infarction [facility]) ?  S/P angioplasty with stent to RCA 08/06/21  ?  Chronic systolic HF (heart failure) ([place]) ? ? ? ?Diagnostic Studies/Procedures  ?  ?Cardiac cath 08/06/21 ?CONCLUSIONS: ?99% mid to distal RCA with TIMI grade II treated with PCI and stent using a 3.5 x 26 Onyx postdilated to 4.0 mm at high pressure and resulting in 0% stenosis and TIMI grade III flow. ?Left main is patent. ?Ostial proximal LAD 40 to 50% narrowed ?Ostial circumflex 50% ?Mid circumflex and first obtuse marginal [place] 111 stenosis 50%, 50%, 50%. ?Severe anterior wall hypokinesis.  EF 35 to 40%.  Normal LVEDP, thus findings are consistent with chronic systolic heart failure, ischemic cardiomyopathy. ?  ?RECOMMENDATIONS: ?  ?Continue aspirin and Plavix dual antiplatelet therapy for 12 months then return to clopidogrel monotherapy.  He has history of GI bleeding on dual antiplatelet therapy so and tendons will be up for any evidence of bleeding. ?Continue aggressive lipid-lowering with Zetia, icosapent ethyl, and high intensity statin therapy. ?Continue guideline directed quadruple therapy for systolic heart failure. ?Monitor overnight and discharge in a.m. if no complications. ? Diagnostic ?Dominance: Right ?Intervention ?  ?  ?_____________ ?  ?History of Present Illness   ?  ?Jack [name] is a 56 y.o. male with hx of  CAD status post STEMI 11/2017 treated with DES to the LAD with residual  moderate CAD in the circumflex and RCA. Developed post infarct pericarditis, decreased LV systolic function EF 25% with LV thrombus requiring Coumadin. Developed a significant GI bleed on Coumadin and dual antiplatelet therapy. Also has essential hypertension, seizure disorder, eosinophilic granulomatosis, Crohn's disease removal of intra-dural meningioma of the lower spine.  now with tightness in upper sternal area that would build in intensity with duration and intensity of physical activity.  Resolves with rest. Medication adjustment did not help.  Nuc study was neg but with ongoing symptoms Dr. Tamala Julian recommended cardiac cath for further eval, define anatomy and guide therapy.  He presented electively for cardiac cath 08/06/21.finding 99% mid to distal RCA  treated with PCI and stent.  Residual disease in ostial prox LAD 40-50% ostial LCX 50%  EF 35-40% ? ?Hospital Course  ?   ?Consultants: none  ? ?Pt admitted overnight to monitor.  He did well overnight and has walked without issues with cardiac rehab.  DAPT for 12 mnths then plavix monotherapy.  Hx of GI bleed in past, discussed for him to call if any GI bleed, hematuria.  His cholesterol is managed with  atorvastatin 80 mg daily, ezetimibe 10 mg daily, vascepa 2 gm BID. ? ?Continue dapagliflozin, carvedilol. Restart entresto today as bp improved, restart spironolactone tomorrow. Check home blood pressures and contact us with <100/60 or symptoms of lightheadedness. Stable for discharge per Dr. Harrell Gave who has seen and evaluated. ? ?Did the patient have an acute coronary syndrome (MI, NSTEMI, STEMI, etc) this admission?:  No                               ?  Did the patient have a percutaneous coronary intervention (stent / angioplasty)?:  Yes.   ? ? ?Cath/PCI Registry Performance & Quality Measures: ?Aspirin prescribed? - Yes ?ADP Receptor Inhibitor (Plavix/Clopidogrel, Brilinta/Ticagrelor or Effient/Prasugrel) prescribed (includes medically managed patients)?  - Yes ?High Intensity Statin (Lipitor 40-74m or Crestor 20-454m prescribed? - Yes ?For EF <40%, was ACEI/ARB prescribed? - Yes ?For EF <40%, Aldosterone Antagonist (Spironolactone or Eplerenone) prescribed? - Yes ?Cardiac Rehab Phase II ordered? - Yes  ? ?   ? ?  ?_____________ ? ?Discharge Vitals ?Blood pressure 131/80, pulse 62, temperature 97.6 ?F (36.4 ?C), temperature source Axillary, resp. rate 17, height 5' 7"  (1.702 m), weight 76.5 kg, SpO2 98 %.  ?Filed Weights  ? 08/06/21 0739 08/06/21 1626  ?Weight: 74.8 kg 76.5 kg  ? ? ?Labs & Radiologic Studies  ?  ?CBC ?Recent Labs  ?  08/07/21 ?0350  ?WBC 11.1*  ?HGB 13.3  ?HCT 40.3  ?MCV 95.5  ?PLT 223  ? ?Basic Metabolic Panel ?Recent Labs  ?  08/07/21 ?0350  ?NA 140  ?K 3.8  ?CL 111  ?CO2 25  ?GLUCOSE 90  ?BUN 19  ?CREATININE 1.13  ?CALCIUM 8.1*  ? ?Liver Function Tests ?No results for input(s): AST, ALT, ALKPHOS, BILITOT, PROT, ALBUMIN in the last 72 hours. ?No results for input(s): LIPASE, AMYLASE in the last 72 hours. ?High Sensitivity Troponin:   ?No results for input(s): TROPONINIHS in the last 720 hours.  ?BNP ?Invalid input(s): POCBNP ?D-Dimer ?No results for input(s): DDIMER in the last 72 hours. ?Hemoglobin A1C ?No results for input(s): HGBA1C in the last 72 hours. ?Fasting Lipid Panel ?No results for input(s): CHOL, HDL, LDLCALC, TRIG, CHOLHDL, LDLDIRECT in the last 72 hours. ?Thyroid Function Tests ?No results for input(s): TSH, T4TOTAL, T3FREE, THYROIDAB in the last 72 hours. ? ?Invalid input(s): FREET3 ?_____________  ?CARDIAC CATHETERIZATION ? ?Result Date: 08/06/2021 ?CONCLUSIONS: 99% mid to distal RCA with TIMI grade II treated with PCI and stent using a 3.5 x 26 Onyx postdilated to 4.0 mm at high pressure and resulting in 0% stenosis and TIMI grade III flow. Left main is patent. Ostial proximal LAD 40 to 50% narrowed Ostial circumflex 50% Mid circumflex and first obtuse marginal [place] 111 stenosis 50%, 50%, 50%. Severe anterior wall hypokinesis.   EF 35 to 40%.  Normal LVEDP, thus findings are consistent with chronic systolic heart failure, ischemic cardiomyopathy. RECOMMENDATIONS: Continue aspirin and Plavix dual antiplatelet therapy for 12 months then return to clopidogrel monotherapy.  He has history of GI bleeding on dual antiplatelet therapy so and tendons will be up for any evidence of bleeding. Continue aggressive lipid-lowering with Zetia, icosapent ethyl, and high intensity statin therapy. Continue guideline directed quadruple therapy for systolic heart failure. Monitor overnight and discharge in a.m. if no complications.   ? ?Disposition  ? ?Pt is being discharged home today in good condition. ? ?Follow-up Plans & Appointments  ? ?[phone] any bleeding, swelling or drainage at cath site.  May shower, no tub baths for 48 hours for groin sticks. No lifting over 5 pounds for 3 days.  No Driving for 3 days  ? ?[place] [facility] your home BP is <100/60 , check every morning and with symptoms of lightheadedness.  Ok to resume spironolactone tomorrow  ? ?Heart healthy diet low salt.   ? ?Do not stop plavix and asprin, these work together to keep stent open.  ? ?Take 1 NTG, under your tongue, while  sitting.  If no relief of pain may repeat NTG, one tab every 5 minutes up to 3 tablets total over 15 minutes.  If no relief CALL 911.  If you have dizziness/lightheadness  while taking NTG, stop taking and call 911.       ? ?Do not use Cialis for 4-5 days and then do not use within 72 hours of NTG  ? Follow-up Information   ? ? Belva Crome, MD Follow up on 08/21/2021.   ?Specialty: Cardiology ?Why: at 10:05 AM  please arrive 15 min early to check in ? ?appt is with his PA Richardson Dopp ?Contact information: ?[address] ?[address] ?[place] [address] ?[phone] ? ? ?  ?  ? ?  ?  ? ?  ? ?Discharge Instructions   ? ? Amb Referral to Cardiac Rehabilitation   Complete by: As directed ?  ? Diagnosis:  Coronary  Stents ?PTCA  ?  ? After initial evaluation and assessments completed: Virtual Based Care may be provided alone or in conjunction with Phase 2 Cardiac Rehab based on patient barriers.: Yes  ? ?  ? ? ?D

## 2021-08-07 NOTE — Progress Notes (Signed)
CARDIAC REHAB PHASE I  ? ?PRE:  Rate/Rhythm: 67 SR ? ?  BP: sitting 128/75 ? ?  SaO2:  ? ?MODE:  Ambulation: 430 ft  ? ?POST:  Rate/Rhythm: 72 SR ? ?  BP: sitting 130/73  ? ?  SaO2:  ? ?Tolerated well, no c/o. Eager for d/c. Discussed with pt stent, Plavix, restrictions, diet, exercise, NTG, and CRPII. Pt receptive. Will refer to [place] however he did in '19 and now exercises on his own therefore not interested in program. ?[id number] ? ?Yves Dill CES, ACSM ?08/07/2021 ?9:38 AM ? ? ? ? ?

## 2021-08-08 LAB — LIPOPROTEIN A (LPA): Lipoprotein (a): 265.5 nmol/L — ABNORMAL HIGH (ref <75.0)

## 2021-08-12 ENCOUNTER — Telehealth: Payer: Self-pay

## 2021-08-12 NOTE — Telephone Encounter (Signed)
Spoke with patient's wife Mickel Baas (OK per [facility]) to discuss results of labwork. ? ?Per Dr. Tamala Julian: ?Let the patient know the LP(a) is extremely elevated.  I will research whether there is a clinical trial that might allow therapy.  LP(a) is associated with increased cardiovascular risk. ? ?Mickel Baas verbalized understanding. She also asked if something had happened during patient's heart catheterization on 08/06/21. She states patient reports his oxygen had dropped during the procedure and he had complained of chest pain and was given sublingal NTG during the procedure. Mickel Baas is asking what happened. ? ?Will forward to Dr. Tamala Julian for clarification on this. ?

## 2021-08-12 NOTE — Telephone Encounter (Signed)
-----   Message from Belva Crome, MD sent at 08/09/2021 10:33 AM EDT ----- ?Let the patient know the LP(a) is extremely elevated.  I will research whether there is a clinical trial that might allow therapy.  LP(a) is associated with increased cardiovascular risk. ?A copy will be sent to Stacie Glaze, DO ?

## 2021-08-13 ENCOUNTER — Telehealth: Payer: Self-pay

## 2021-08-13 ENCOUNTER — Telehealth: Payer: Self-pay | Admitting: *Deleted

## 2021-08-13 NOTE — Telephone Encounter (Signed)
Spoke with patient's spouse, Jack [name] (OK per DPR), and discussed Dr. Thompson Caul response to her questions. ? ?Per Dr. Tamala Julian: ?He had the same kind of chest discomfort that led me to do the heart cath.  That symptom was related to total occlusion of the right coronary, which occurred while we were taking pictures of the left coronary.  The stent took care of that.  Nitroglycerin intracoronary really established blood flow in the vessel before the stent was placed.  This is not anything that will recur now that the artery is stented.  I am glad we decided to look rather than placing all of our faith in the stress test. ? ?Jack [name] verbalized understanding and expressed appreciation for explaining what happened during the cath procedure. ? ?Jack [name] also states patient is going to see Dr. [name] in July for consultation on colonoscopy.  ?Jack [name] states Dr. Vena Rua office asked her if the procedure needs to be done in the endoscopy office or at the hospital, and she asked if we could discuss this decision with patient at his appt with Richardson Dopp, PA-C on 5/24.  ? ?Will forward to [employer], PA-C as an [facility] for appt next week. ?

## 2021-08-13 NOTE — Telephone Encounter (Signed)
-----   Message from Jerene Bears, MD sent at 08/12/2021  4:41 PM EDT ----- ?He came to my attention that Jack [name] was back in the hospital and underwent very recent coronary artery catheterization requiring additional stent placement. ?This very likely changes cardiology's previous approval to hold Plavix x5 days. ?For this reason we should very likely not proceed with colonoscopy as scheduled later this month. ?He can come back to see me in July or August. ? ?Please confirm that he did indeed receive a new coronary artery stent and let me know if he is having any troublesome GI symptoms ?Continue Crohn's therapy ? ?JMP ? ?

## 2021-08-13 NOTE — Telephone Encounter (Signed)
-----   Message from Jerene Bears, MD sent at 08/12/2021  4:41 PM EDT ----- ?He came to my attention that [name] was back in the hospital and underwent very recent coronary artery catheterization requiring additional stent placement. ?This very likely changes cardiology's previous approval to hold Plavix x5 days. ?For this reason we should very likely not proceed with colonoscopy as scheduled later this month. ?He can come back to see me in July or August. ? ?Please confirm that he did indeed receive a new coronary artery stent and let me know if he is having any troublesome GI symptoms ?Continue Crohn's therapy ? ?JMP ? ?

## 2021-08-13 NOTE — Telephone Encounter (Signed)
Patient did have stent placement 08/06/21. He already planned to cancel 08/22/21 colonoscopy for now. He is instead scheduled for office visit with Dr [name] on 10/17/21 at 300 pm. ?

## 2021-08-13 NOTE — Telephone Encounter (Signed)
He had the same kind of chest discomfort that led me to do the heart cath.  That symptom was related to total occlusion of the right coronary, which occurred while we were taking pictures of the left coronary.  The stent took care of that.  Nitroglycerin intracoronary really established blood flow in the vessel before the stent was placed.  This is not anything that will recur now that the artery is stented.  I am glad we decided to look rather than placing all of our faith in the stress test. ?

## 2021-08-13 NOTE — Telephone Encounter (Signed)
Left message for pts wife to call back. ?

## 2021-08-14 NOTE — Telephone Encounter (Signed)
See additional phone note from [place]. ?

## 2021-08-15 ENCOUNTER — Encounter: Payer: Self-pay | Admitting: Internal Medicine

## 2021-08-20 DIAGNOSIS — E7841 Elevated Lipoprotein(a): Secondary | ICD-10-CM | POA: Insufficient documentation

## 2021-08-20 NOTE — Progress Notes (Unsigned)
Cardiology Office Note:    Date:  08/21/2021   ID:  Maurine Cane, DOB 11/30/1965, MRN [id number]  PCP:  [name], MD  [facility] Providers Cardiologist:  Sinclair Grooms, MD    Referring MD: [name], MD   Chief Complaint:  Hospitalization Follow-up (Status post PCI)    Patient Profile: Coronary artery disease Anterior STEMI in 11/2017 s/p DES to LAD C/b post infarct pericarditis LV thrombus >> Coumadin Rx  S/p DES to the RCA in May 2023 (HFrEF) heart failure with reduced ejection fraction Ischemic CM Echocardiogram 05/2021: EF 35-40 Hx of GI bleed on Coumadin +DAPT Hypertension  Hyperlipidemia  Seizure disorder Eosinophilic granulomoatosis Crohn's disease  Hx of intra-dural mengioma s/p resection  OSA   Prior CV Studies: LEFT HEART CATH AND CORONARY ANGIOGRAPHY, LEFT HEART CATH AND CORONARY ANGIOGRAPHY 08/06/2021 LM mid 20, dist (ost LAD) 16 LAD stent ok RI small ost 90 LCx ost 50, prox 50; OM1 60 RCA mid 99 EF 35-45 PCI: 3.5 x 26 mm Onyx DES to RCA   RECOMMENDATIONS:  Continue aspirin and Plavix dual antiplatelet therapy for 12 months then return to clopidogrel monotherapy.  He has history of GI bleeding on dual antiplatelet therapy so and tendons will be up for any evidence of bleeding. Continue aggressive lipid-lowering with Zetia, icosapent ethyl, and high intensity statin therapy. Continue guideline directed quadruple therapy for systolic heart failure. Monitor overnight and discharge in a.m. if no complications.    GATED SPECT MYO PERF W/LEXISCAN STRESS 1D 07/01/2021 Prior myocardial infarction, EF 34, no ischemia; high risk   ECHO COMPLETE WITH IMAGING ENHANCING AGENT 06/28/2021 EF 35-40, no RWMA, GRII DD, apical, mid inferoseptal and anteroseptal, apical septal and anterior AK, normal RVSF, mild LAE, mild MR,  History of Present Illness:   SHEY BARTMESS is a 56 y.o. male with the above problem list.  He was recently  seen by Dr. Tamala Julian for progressive angina.  A recent Myoview showed infarct but no ischemia.  Cardiac catheterization was arranged for 08/06/2021 and demonstrated a patent stent in the LAD.  There was high grade disease in the RCA which was treated with a DES.  He has residual high grade disease in a small RI which is managed medically as well as mod non-obstructive disease in the LCx, OM and dist LM/ost LAD.  His post PCI course was uneventful.  He returns for f/u.  He is here alone.  He had his wife joined by speaker phone.  Since the PCI, he has noted that he is fatigued/tired.  He works out with a Physiological scientist and feels that he is getting more tired with exercise.  He has not had any further chest pain.  He has not had shortness of breath, syncope, near syncope, orthopnea or leg edema.    Past Medical History:  Diagnosis Date   Acquired renal cyst of right kidney 08/31/2018   2.8cm R upper pole rec monitor with yearly imaging (09/2018)   Allergy    BPH (benign prostatic hypertrophy)    CAD (coronary artery disease)    a. anterior STEMI 01/2018 -  proximal occlusion of LAD, treated with DES. Cath also showed 20% distal LM, 95% ostial-prox small-moderate ramus, 70-80% ostial Cx, 70% dominant ostial OM, 50-60% prox Cx, 70% RCA. EF 35% by cath with LVEDP 32mHg. Med rx for residual disease. Course complicated by post MI pericarditis and LV thrombus.   Chronic systolic (congestive) heart failure (HCC)    Chronic  systolic HF (heart failure) ([place]) 08/07/2021   Colitis    Colon polyp    inflammatory   Crohn disease ([place]) 1992   history uveitis, involvement of intestines and lungs   Diverticulosis    Eosinophilic granuloma ([place])    Essential hypertension    GERD (gastroesophageal reflux disease)    GI bleeding    History of chicken pox    History of gastroesophageal reflux (GERD)    History of seizure 1995   grand mal x1, completed 6 yrs dilantin. no seizures since   Hyperlipidemia    Internal  hemorrhoids    Ischemic cardiomyopathy    LV (left ventricular) mural thrombus following MI ([place]) 01/2018   Myocardial infarction [facility])    Osteoporosis 11/2015   DEXA T -2.9   Schwannoma 2007   L axilla s/p surgery   Seizures ([place])    last seizure 1995 and only x 1 seizure-    Ulnar neuropathy    h/o this from L arm schwannoma   Current Medications: Current Meds  Medication Sig   acetaminophen (TYLENOL) 325 MG tablet Take 2 tablets (650 mg total) by mouth every 4 (four) hours as needed for headache or mild pain.   alendronate (FOSAMAX) 70 MG tablet Take 70 mg by mouth every Saturday.    amitriptyline (ELAVIL) 25 MG tablet TAKE 1 TABLET (25 MG TOTAL) BY MOUTH AT BEDTIME AS NEEDED FOR SLEEP.   aspirin 81 MG chewable tablet Chew 1 tablet (81 mg total) by mouth daily.   atorvastatin (LIPITOR) 80 MG tablet TAKE 1 TABLET BY MOUTH DAILY AT 6 PM.   azaTHIOprine (IMURAN) 50 MG tablet TAKE 3 TABLETS BY MOUTH EVERY DAY   Calcium Carbonate-Vitamin D3 (CALCIUM 600/VITAMIN D) 600-400 MG-UNIT TABS Take 1 tablet by mouth daily.   carvedilol (COREG) 3.125 MG tablet Take 1 tablet (3.125 mg total) by mouth 2 (two) times daily.   clopidogrel (PLAVIX) 75 MG tablet TAKE 1 TABLET BY MOUTH EVERY DAY   dapagliflozin propanediol (FARXIGA) 10 MG TABS tablet Take 1 tablet (10 mg total) by mouth daily before breakfast.   diphenoxylate-atropine (LOMOTIL) 2.5-0.025 MG tablet TAKE 1-2 TABLETS BY MOUTH 4 TIMES A DAY AS NEEDED FOR DIARRHEA   ezetimibe (ZETIA) 10 MG tablet TAKE 1 TABLET BY MOUTH EVERY DAY   finasteride (PROSCAR) 5 MG tablet Take 5 mg by mouth at bedtime.   icosapent Ethyl (VASCEPA) 1 g capsule TAKE 2 CAPSULES BY MOUTH 2 TIMES DAILY.   MAGNESIUM PO Take 1-2 tablets by mouth See admin instructions. Alternate taking 1 tablet one night and 2 tablets the next night   Multiple Vitamin (MULTIVITAMIN) capsule Take 2 capsules by mouth daily.   nitroGLYCERIN (NITROSTAT) 0.4 MG SL tablet Place 1 tablet (0.4 mg  total) under the tongue every 5 (five) minutes as needed for chest pain.   pantoprazole (PROTONIX) 40 MG tablet TAKE 1 TABLET BY MOUTH EVERY DAY   predniSONE (DELTASONE) 10 MG tablet Take as directed (Currently taking 21 mg daily- decrease by 1 mg as tolerated)   predniSONE (DELTASONE) 5 MG tablet Take as directed. (currently on 21 mg daily- decrease by 1 mg as tolerated)   sacubitril-valsartan (ENTRESTO) 24-26 MG Take 1 tablet by mouth 2 (two) times daily.   silodosin (RAPAFLO) 8 MG CAPS capsule Take 1 capsule (8 mg total) by mouth daily with breakfast.   spironolactone (ALDACTONE) 25 MG tablet Take 0.5 tablets (12.5 mg total) by mouth every Monday, Wednesday, and Friday.   tadalafil (  CIALIS) 20 MG tablet Take 0.5-1 tablets (10-20 mg total) by mouth daily as needed for erectile dysfunction.   vedolizumab (ENTYVIO) 300 MG injection Inject 300 mg into the vein every 28 (twenty-eight) days.   [DISCONTINUED] carvedilol (COREG) 6.25 MG tablet TAKE 1 TABLET BY MOUTH TWICE A DAY    Allergies:   Cephalexin and Duloxetine   Social History   Tobacco Use   Smoking status: Former    Types: Cigars    Quit date: 02/20/2018    Years since quitting: 3.5   Smokeless tobacco: Never  Vaping Use   Vaping Use: Never used  Substance Use Topics   Alcohol use: Yes    Comment: Occasional   Drug use: No    Family Hx: The patient's family history includes CAD (age of onset: 76) in his father and mother; Colon cancer in his maternal grandmother; Congenital heart disease in his mother; Crohn's disease in his brother; Hyperlipidemia in his father and mother; Hypertension in his father and mother. There is no history of Diabetes, Colon polyps, Esophageal cancer, Rectal cancer, or Stomach cancer.  Review of Systems  Gastrointestinal:  Negative for hematochezia.  Genitourinary:  Negative for hematuria.    EKGs/Labs/Other Test Reviewed:    EKG:  EKG is  ordered today.  The ekg ordered today demonstrates NSR, HR  65, normal axis, anteroseptal Q waves, nonspecific ST-T wave changes, QTc 428  Recent Labs: 07/17/2021: NT-Pro BNP 470 08/21/2021: BUN 32; Creatinine, Ser 1.28; Hemoglobin WILL FOLLOW; Platelets WILL FOLLOW; Potassium 4.9; Sodium 138   Recent Lipid Panel No results for input(s): CHOL, TRIG, HDL, VLDL, LDLCALC, LDLDIRECT in the last 8760 hours.   Risk Assessment/Calculations:         Physical Exam:    VS:  BP 102/62   Pulse 65   Ht 5' 7"  (1.702 m)   Wt 169 lb 9.6 oz (76.9 kg)   SpO2 97%   BMI 26.56 kg/m     Wt Readings from Last 3 Encounters:  08/21/21 169 lb 9.6 oz (76.9 kg)  08/06/21 168 lb 11.2 oz (76.5 kg)  07/24/21 171 lb 6.4 oz (77.7 kg)    Constitutional:      Appearance: Healthy appearance. Not in distress.  Neck:     Vascular: No JVR. JVD normal.  Pulmonary:     Effort: Pulmonary effort is normal.     Breath sounds: No wheezing. No rales.  Cardiovascular:     Normal rate. Regular rhythm. Normal S1. Normal S2.      Murmurs: There is no murmur.     Comments: Left wrist without hematoma Edema:    Peripheral edema absent.  Abdominal:     Palpations: Abdomen is soft.  Skin:    General: Skin is warm and dry.  Neurological:     General: No focal deficit present.     Mental Status: Alert and oriented to person, place and time.     Cranial Nerves: Cranial nerves are intact.        ASSESSMENT & PLAN:   CAD (coronary artery disease) Status post recent cardiac catheterization and PCI with DES to the RCA.  His stent in the LAD was patent.  He has 90% stenosis in a small ostial ramus intermediate and moderate nonobstructive disease in the distal left main/ostial LAD, LCx and OM.  He is doing well without anginal symptoms.  He does note some symptoms of fatigue.  Question if his pressures may have been running higher prior to his  procedure and now are lower.  Decrease carvedilol to 3.125 mg twice daily.  Continue aspirin 81 mg daily, clopidogrel 75 mg daily, atorvastatin 80  mg daily, ezetimibe 10 mg daily.  He is not interested in formal cardiac rehabilitation if he sees a Physiological scientist on his own.  Follow-up with Dr. Tamala Julian in 3 months.  Essential hypertension He is pressure is somewhat lower.  Repeat blood pressure by me was 102/70.  He does note some fatigue.  Question if he is symptomatic from this.  Obtain BMET, CBC today.  Decrease carvedilol to 3.125 mg twice daily.  Continue Entresto 24/26 mg twice daily, spironolactone 12.5 mg 3 times a week.  He knows to contact us if his symptoms do not improve or worsen.  HFrEF (heart failure with reduced ejection fraction) (HCC) EF 35-40 by recent echocardiogram.  Ischemic cardiomyopathy.  NYHA I-II.  Volume status stable.  Decrease carvedilol as outlined secondary to low blood pressure.  Continue Entresto 24/26 mg twice daily, spironolactone 12.5 mg 3 times a week, Farxiga 10 mg daily.  Follow-up in 3 months.  Hyperlipidemia LDL goal <70 LDL in April 2022 was 47.  Continue Lipitor 80 mg daily, Zetia 10 mg daily.           Dispo:  Return in about 3 months (around 11/21/2021) for Routine Follow Up with Dr. Tamala Julian.   Medication Adjustments/Labs and Tests Ordered: Current medicines are reviewed at length with the patient today.  Concerns regarding medicines are outlined above.  Tests Ordered: Orders Placed This Encounter  Procedures   Basic metabolic panel   CBC   EKG 12-Lead   Medication Changes: Meds ordered this encounter  Medications   carvedilol (COREG) 3.125 MG tablet    Sig: Take 1 tablet (3.125 mg total) by mouth 2 (two) times daily.    Dispense:  180 tablet    Refill:  3   Signed, Richardson Dopp, PA-C  08/21/2021 5:50 PM    [facility] [address], [place], [place]  [address] Phone: [phone]; Fax: [phone]

## 2021-08-21 ENCOUNTER — Ambulatory Visit (INDEPENDENT_AMBULATORY_CARE_PROVIDER_SITE_OTHER): Payer: BC Managed Care – PPO | Admitting: Physician Assistant

## 2021-08-21 ENCOUNTER — Encounter: Payer: Self-pay | Admitting: Physician Assistant

## 2021-08-21 VITALS — BP 102/62 | HR 65 | Ht 67.0 in | Wt 169.6 lb

## 2021-08-21 DIAGNOSIS — I10 Essential (primary) hypertension: Secondary | ICD-10-CM | POA: Diagnosis not present

## 2021-08-21 DIAGNOSIS — E7841 Elevated Lipoprotein(a): Secondary | ICD-10-CM

## 2021-08-21 DIAGNOSIS — I2510 Atherosclerotic heart disease of native coronary artery without angina pectoris: Secondary | ICD-10-CM

## 2021-08-21 DIAGNOSIS — I5020 Unspecified systolic (congestive) heart failure: Secondary | ICD-10-CM

## 2021-08-21 DIAGNOSIS — E785 Hyperlipidemia, unspecified: Secondary | ICD-10-CM

## 2021-08-21 LAB — BASIC METABOLIC PANEL
BUN/Creatinine Ratio: 25 — ABNORMAL HIGH (ref 9–20)
BUN: 32 mg/dL — ABNORMAL HIGH (ref 6–24)
CO2: 27 mmol/L (ref 20–29)
Calcium: 9.1 mg/dL (ref 8.7–10.2)
Chloride: 99 mmol/L (ref 96–106)
Creatinine, Ser: 1.28 mg/dL — ABNORMAL HIGH (ref 0.76–1.27)
Glucose: 114 mg/dL — ABNORMAL HIGH (ref 70–99)
Potassium: 4.9 mmol/L (ref 3.5–5.2)
Sodium: 138 mmol/L (ref 134–144)
eGFR: 67 mL/min/{1.73_m2} (ref >59)

## 2021-08-21 LAB — CBC
Hematocrit: 44 % (ref 37.5–51.0)
Hemoglobin: 14.6 g/dL (ref 13.0–17.7)
MCH: 31.3 pg (ref 26.6–33.0)
MCHC: 33.2 g/dL (ref 31.5–35.7)
MCV: 94 fL (ref 79–97)
Platelets: 314 10*3/uL (ref 150–450)
RBC: 4.66 x10E6/uL (ref 4.14–5.80)
RDW: 12.4 % (ref 11.6–15.4)
WBC: 14.1 10*3/uL — ABNORMAL HIGH (ref 3.4–10.8)

## 2021-08-21 MED ORDER — CARVEDILOL 3.125 MG PO TABS: 3.1250 mg | ORAL_TABLET | Freq: Two times a day (BID) | ORAL | 3 refills | Status: DC

## 2021-08-21 NOTE — Assessment & Plan Note (Signed)
He is pressure is somewhat lower.  Repeat blood pressure by me was 102/70.  He does note some fatigue.  Question if he is symptomatic from this.  Obtain BMET, CBC today.  Decrease carvedilol to 3.125 mg twice daily.  Continue Entresto 24/26 mg twice daily, spironolactone 12.5 mg 3 times a week.  He knows to contact us if his symptoms do not improve or worsen.

## 2021-08-21 NOTE — Assessment & Plan Note (Signed)
EF 35-40 by recent echocardiogram.  Ischemic cardiomyopathy.  NYHA I-II.  Volume status stable.  Decrease carvedilol as outlined secondary to low blood pressure.  Continue Entresto 24/26 mg twice daily, spironolactone 12.5 mg 3 times a week, Farxiga 10 mg daily.  Follow-up in 3 months.

## 2021-08-21 NOTE — Assessment & Plan Note (Signed)
Status post recent cardiac catheterization and PCI with DES to the RCA.  His stent in the LAD was patent.  He has 90% stenosis in a small ostial ramus intermediate and moderate nonobstructive disease in the distal left main/ostial LAD, LCx and OM.  He is doing well without anginal symptoms.  He does note some symptoms of fatigue.  Question if his pressures may have been running higher prior to his procedure and now are lower.  Decrease carvedilol to 3.125 mg twice daily.  Continue aspirin 81 mg daily, clopidogrel 75 mg daily, atorvastatin 80 mg daily, ezetimibe 10 mg daily.  He is not interested in formal cardiac rehabilitation if he sees a Physiological scientist on his own.  Follow-up with Dr. Tamala Julian in 3 months.

## 2021-08-21 NOTE — Patient Instructions (Addendum)
Medication Instructions:  Your physician has recommended you make the following change in your medication:   REDUCE the Carvedilol to 3.125 taking 1 twice a day.  You can cut the 6.25 mg tablets in 1/2 and take 1/2 bid.    *If you need a refill on your cardiac medications before your next appointment, please call your pharmacy*   Lab Work: TODAY:  BMET & CBC  If you have labs (blood work) drawn today and your tests are completely normal, you will receive your results only by: [place] (if you have MyChart) OR A paper copy in the mail If you have any lab test that is abnormal or we need to change your treatment, we will call you to review the results.   Testing/Procedures: None ordered   Follow-Up: At [facility], you and your health needs are our priority.  As part of our continuing mission to provide you with exceptional heart care, we have created designated Provider Care Teams.  These Care Teams include your primary Cardiologist (physician) and Advanced Practice Providers (APPs -  Physician Assistants and Nurse Practitioners) who all work together to provide you with the care you need, when you need it.  We recommend signing up for the patient portal called "MyChart".  Sign up information is provided on this After Visit Summary.  MyChart is used to connect with patients for Virtual Visits (Telemedicine).  Patients are able to view lab/test results, encounter notes, upcoming appointments, etc.  Non-urgent messages can be sent to your provider as well.   To learn more about what you can do with MyChart, go to [email or web address].    Your next appointment:   3 month(s)  11/25/21 arrive at 8:05  The format for your next appointment:   In Person  Provider:   Sinclair Grooms, MD     Other Instructions   Important Information About Sugar

## 2021-08-21 NOTE — Assessment & Plan Note (Signed)
LDL in April 2022 was 47.  Continue Lipitor 80 mg daily, Zetia 10 mg daily.

## 2021-08-22 ENCOUNTER — Telehealth: Payer: Self-pay

## 2021-08-22 ENCOUNTER — Encounter: Payer: Self-pay | Admitting: Internal Medicine

## 2021-08-22 DIAGNOSIS — D72829 Elevated white blood cell count, unspecified: Secondary | ICD-10-CM

## 2021-08-22 DIAGNOSIS — R7989 Other specified abnormal findings of blood chemistry: Secondary | ICD-10-CM

## 2021-08-22 DIAGNOSIS — R799 Abnormal finding of blood chemistry, unspecified: Secondary | ICD-10-CM

## 2021-08-22 NOTE — Telephone Encounter (Signed)
-----   Message from Liliane Shi, [place] sent at 08/22/2021  8:03 AM EDT ----- BUN/Creatinine elevated. K+ normal.  WBC elevated but c/w chronic elevation. Hgb normal.  Elevated BUN/Creatinine ratio indicates he may be a little dehydrated.   He did not have infectious symptoms yesterday and he has prednisone on his med list which is likely cause of elevated WBC PLAN:  -Hold 1 dose of Entresto today and resume twice daily tomorrow -Push fluids (water) today -Please recheck to see if he is having any infectious symptoms (fever, cough, dysuria, rash) -BMET, CBC w diff in 1 week. Richardson Dopp, PA-C    08/22/2021 7:55 AM

## 2021-08-22 NOTE — Telephone Encounter (Signed)
The patient has been notified of the result and verbalized understanding.  All questions (if any) were answered. Michaelyn Barter, RN 08/22/2021 1:27 PM   Will order lab work and patient will come in on August 29, 2021 for lab work.

## 2021-08-23 ENCOUNTER — Telehealth: Payer: Self-pay | Admitting: *Deleted

## 2021-08-23 DIAGNOSIS — E785 Hyperlipidemia, unspecified: Secondary | ICD-10-CM

## 2021-08-23 NOTE — Telephone Encounter (Signed)
-----   Message from Liliane Shi, [place] sent at 08/21/2021  5:40 PM EDT ----- Regarding: refer to dr. Debara Pickett for Lp(a) I reviewed his lipoprotein a level with Dr. Tamala Julian. His lipoprotein a level is quite high. I briefly discussed with the patient referring him to Dr. Debara Pickett for consideration of cholesterol trials.  Please refer to Dr. Debara Pickett for elevated LP(a) [lipoprotein a].  Thanks [employer]

## 2021-08-27 ENCOUNTER — Encounter: Payer: Self-pay | Admitting: Family Medicine

## 2021-08-29 ENCOUNTER — Other Ambulatory Visit: Payer: BC Managed Care – PPO

## 2021-08-29 DIAGNOSIS — M13811 Other specified arthritis, right shoulder: Secondary | ICD-10-CM | POA: Diagnosis not present

## 2021-08-29 DIAGNOSIS — M25511 Pain in right shoulder: Secondary | ICD-10-CM | POA: Diagnosis not present

## 2021-08-29 DIAGNOSIS — M7591 Shoulder lesion, unspecified, right shoulder: Secondary | ICD-10-CM | POA: Diagnosis not present

## 2021-09-02 DIAGNOSIS — K5090 Crohn's disease, unspecified, without complications: Secondary | ICD-10-CM | POA: Diagnosis not present

## 2021-09-30 DIAGNOSIS — K5090 Crohn's disease, unspecified, without complications: Secondary | ICD-10-CM | POA: Diagnosis not present

## 2021-10-17 ENCOUNTER — Encounter: Payer: Self-pay | Admitting: Internal Medicine

## 2021-10-17 ENCOUNTER — Ambulatory Visit (INDEPENDENT_AMBULATORY_CARE_PROVIDER_SITE_OTHER): Payer: BC Managed Care – PPO | Admitting: Internal Medicine

## 2021-10-17 VITALS — BP 110/72 | HR 75 | Ht 67.0 in | Wt 172.1 lb

## 2021-10-17 DIAGNOSIS — Z7952 Long term (current) use of systemic steroids: Secondary | ICD-10-CM

## 2021-10-17 DIAGNOSIS — Z79899 Other long term (current) drug therapy: Secondary | ICD-10-CM

## 2021-10-17 DIAGNOSIS — K5080 Crohn's disease of both small and large intestine without complications: Secondary | ICD-10-CM | POA: Diagnosis not present

## 2021-10-17 NOTE — Patient Instructions (Signed)
If you are age 54 or older, your body mass index should be between 23-30. Your Body mass index is 26.96 kg/m. If this is out of the aforementioned range listed, please consider follow up with your Primary Care Provider.  If you are age 7 or younger, your body mass index should be between 19-25. Your Body mass index is 26.96 kg/m. If this is out of the aformentioned range listed, please consider follow up with your Primary Care Provider.   ________________________________________________________  The [facility] providers would like to encourage you to use [facility] to communicate with providers for non-urgent requests or questions.  Due to long hold times on the telephone, sending your provider a message by [facility] may be a faster and more efficient way to get a response.  Please allow 48 business hours for a response.  Please remember that this is for non-urgent requests.  _______________________________________________________   Continue current medications.  Follow up in 6 months.   Thank you for entrusting me with your care and choosing [facility].  Dr. [name]

## 2021-10-17 NOTE — Progress Notes (Signed)
Subjective:    Patient ID: Jack [name], male    DOB: [birth year], 56 y.o.   MRN: [id number]  HPI Jack [name] is a 56 year old male with longstanding ileocolonic Crohn's disease, history of adenomatous polyps, hx of C diff colitis,  chronic eosinophilic pneumonitis/vasculitis on chronic prednisone, Entyvio and azathioprine, history of CAD with PCI in November 2019 and recently May 2023 on Plavix who is here for follow-up.  He is here alone today and was last seen in February 2023  He reports he is doing well.  Unfortunately he had to have additional coronary revascularization in May 2023.  An additional drug-eluting stent was placed.  He is feeling much better with increased exercise tolerance since that procedure.    From a GI perspective symptoms are well controlled.  He reports regular bowel movements.  No diarrhea.  No abdominal pain.  No blood in stool.  He is eating Entyvio 300 mg every 4 weeks.  He is also taking azathioprine 150 mg a day.  He is currently tapering steroids but this is all based on his pneumonitis symptoms not his colitis symptoms.  He believes he is around 19 or 20 mg/day.  He has never been able to drop below 15 mg without recurrent pneumonitis symptoms.  He is working out 3 days a week with a Physiological scientist.  He sees Dr. Dossie Der with rheumatology.  They are expecting their second grandson child later this year.  Their son who lives in [place] had a son earlier this year.   Review of Systems As per HPI, otherwise negative  Current Medications, Allergies, Past Medical History, Past Surgical History, Family History and Social History were reviewed in [employer] record.     Objective:   Physical Exam BP 110/72   Pulse 75   Ht 5' 7"  (1.702 m)   Wt 172 lb 2 oz (78.1 kg)   SpO2 98%   BMI 26.96 kg/m  Gen: awake, alert, NAD HEENT: anicteric  CV: RRR, no mrg Pulm: CTA b/l Abd: soft, NT/ND, +BS throughout Ext: no c/c/e Neuro:  nonfocal     Latest Ref Rng & Units 08/21/2021   10:50 AM 08/07/2021    3:50 AM 07/24/2021    9:41 AM  CBC  WBC 3.4 - 10.8 x10E3/uL 14.1  11.1  11.4   Hemoglobin 13.0 - 17.7 g/dL 14.6  13.3  13.6   Hematocrit 37.5 - 51.0 % 44.0  40.3  42.0   Platelets 150 - 450 x10E3/uL 314  223  243    CMP     Component Value Date/Time   NA 138 08/21/2021 1050   K 4.9 08/21/2021 1050   CL 99 08/21/2021 1050   CO2 27 08/21/2021 1050   GLUCOSE 114 (H) 08/21/2021 1050   GLUCOSE 90 08/07/2021 0350   BUN 32 (H) 08/21/2021 1050   CREATININE 1.28 (H) 08/21/2021 1050   CALCIUM 9.1 08/21/2021 1050   PROT 6.7 06/04/2020 1603   PROT 6.8 11/22/2018 0958   ALBUMIN 3.7 07/16/2020 0844   ALBUMIN 4.0 11/22/2018 0958   AST 25 06/04/2020 1603   ALT 35 06/04/2020 1603   ALKPHOS 37 (L) 06/04/2020 1603   BILITOT 0.8 06/04/2020 1603   BILITOT 0.7 11/22/2018 0958   GFRNONAA >60 08/07/2021 0350   GFRAA 88 01/27/2020 0941        Assessment & Plan:  56 year old male with longstanding ileocolonic Crohn's disease, history of adenomatous polyps, hx of C diff colitis,  chronic  eosinophilic pneumonitis/vasculitis on chronic prednisone, Entyvio and azathioprine, history of CAD with PCI in November 2019 and recently May 2023 on Plavix who is here for follow-up.  Crohn's ileocolitis, longstanding --symptoms under good control at present.  Prior attempted stopping azathioprine resulted in Crohn's colitis flare.  Flare was complicated by C. difficile infection earlier this year. --Continue Entyvio 300 mg every 4 weeks --Continue azathioprine 150 mg daily --Surveillance colonoscopy recommended at this time however due to recent drug-eluting stent Plavix needs to be uninterrupted at least through May 2024; surveillance colonoscopy shortly thereafter assuming he remains stable from a cardiac perspective  2.  History of gastritis chronic steroids --continue daily pantoprazole 40 mg  3.  History of IDA secondary to IBD --not  recently a problem, normal hemoglobin  4.  Eosinophilic pneumonitis/vasculitis --on chronic steroids, management per rheumatology  5.  CAD with PCI most recently May 2023 --drug-eluting stent placed May 2023, uninterrupted Plavix for at least 1 year.  Currently doing well without anginal symptoms.  30 minutes total spent today including patient facing time, coordination of care, reviewing medical history/procedures/pertinent radiology studies, and documentation of the encounter.  36-monthfollow-up, sooner if needed

## 2021-10-19 ENCOUNTER — Other Ambulatory Visit: Payer: Self-pay | Admitting: Internal Medicine

## 2021-10-20 ENCOUNTER — Other Ambulatory Visit: Payer: Self-pay | Admitting: Family Medicine

## 2021-10-22 NOTE — Telephone Encounter (Signed)
E-scribed refill.  Plz schedule CPE and lab visits for additional refills.

## 2021-10-22 NOTE — Telephone Encounter (Signed)
Spoke with patient wife and she will have [name] give [place] a call to schedule.

## 2021-10-22 NOTE — Telephone Encounter (Signed)
Noted  

## 2021-10-24 DIAGNOSIS — S81812A Laceration without foreign body, left lower leg, initial encounter: Secondary | ICD-10-CM | POA: Diagnosis not present

## 2021-11-06 DIAGNOSIS — K5090 Crohn's disease, unspecified, without complications: Secondary | ICD-10-CM | POA: Diagnosis not present

## 2021-11-19 ENCOUNTER — Other Ambulatory Visit: Payer: Self-pay | Admitting: Family Medicine

## 2021-11-22 ENCOUNTER — Other Ambulatory Visit: Payer: Self-pay | Admitting: Family Medicine

## 2021-11-22 ENCOUNTER — Other Ambulatory Visit: Payer: Self-pay | Admitting: Physician Assistant

## 2021-11-22 NOTE — Telephone Encounter (Signed)
Refill request Amitriptyline Last refill 11/13/20 #90/3 Last office visit 07/25/20 Upcoming appointment 01/27/22

## 2021-11-24 NOTE — Progress Notes (Unsigned)
Cardiology Office Note:    Date:  11/25/2021   ID:  Jack [name], DOB 18-Nov-1965, MRN [id number]  PCP:  [name], MD  Cardiologist:  Sinclair Grooms, MD   Referring MD: [name], MD   Chief Complaint  Patient presents with   Coronary Artery Disease   Congestive Heart Failure   Hypertension    History of Present Illness:    Jack [name] is a 56 y.o. male with a hx of CAD status post STEMI 11/2017 treated with DES to the LAD, and DES RCA 07/2026, post infarct pericarditis 2019, decreased LV systolic function EF 63% with LV thrombus requiring Coumadin. Developed a significant GI bleed on Coumadin and dual antiplatelet therapy. Also has essential hypertension, seizure disorder, eosinophilic granulomatosis, Crohn's disease removal of intra-dural meningioma of the lower spine.    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  Overall, he is doing well.  He is not having angina.  The most recent PCI on the right coronary may has straighten everything out.  The LAD stent at that time looked great.  LV function is still been somewhat reduced however he is on excellent heart failure therapy.  He is remaining physically active.  He has not had syncope.  No need for nitroglycerin.  Exercising regularly.  Past Medical History:  Diagnosis Date   Acquired renal cyst of right kidney 08/31/2018   2.8cm R upper pole rec monitor with yearly imaging (09/2018)   Allergy    BPH (benign prostatic hypertrophy)    CAD (coronary artery disease)    a. anterior STEMI 01/2018 -  proximal occlusion of LAD, treated with DES. Cath also showed 20% distal LM, 95% ostial-prox small-moderate ramus, 70-80% ostial Cx, 70% dominant ostial OM, 50-60% prox Cx, 70% RCA. EF 35% by cath with LVEDP 69mHg. Med rx for residual disease. Course complicated by post MI pericarditis and LV thrombus.   Chronic systolic (congestive) heart failure (HCC)    Chronic systolic  HF (heart failure) ([facility] 08/07/2021   Colitis    Colon polyp    inflammatory   Crohn disease ([facility] 1992   history uveitis, involvement of intestines and lungs   Diverticulosis    Eosinophilic granuloma ([facility]    Essential hypertension    GERD (gastroesophageal reflux disease)    GI bleeding    History of chicken pox    History of gastroesophageal reflux (GERD)    History of seizure 1995   grand mal x1, completed 6 yrs dilantin. no seizures since   Hyperlipidemia    Internal hemorrhoids    Ischemic cardiomyopathy    LV (left ventricular) mural thrombus following MI ([facility] 01/2018   Myocardial infarction ([facility]    Osteoporosis 11/2015   DEXA T -2.9   Schwannoma 2007   L axilla s/p surgery   Seizures ([facility]    last seizure 1995 and only x 1 seizure-    Ulnar neuropathy    h/o this from L arm schwannoma    Past Surgical History:  Procedure Laterality Date   APPENDECTOMY  2000   BACK SURGERY  2011   lumbar   CARDIAC CATHETERIZATION     CARDIOVASCULAR STRESS TEST  01/2015   low risk study   CHOLECYSTECTOMY  2000   COLONOSCOPY  08/2014   f/u crohn's, 4 inflammatory polyps, diverticulosis, improved, rpt [place]rs [place]   COLONOSCOPY  11/2018   multiple polyps, 1 TA, rpt 2 yrs (  [facility])   COLONOSCOPY WITH PROPOFOL N/A 04/09/2018   inflammatory polyp, f/u left to primary GI - [place] [name], Kirke Corin, MD)   CORONARY ANGIOGRAPHY N/A 02/21/2018   Procedure: CORONARY ANGIOGRAPHY;  Surgeon: Belva Crome, MD;  Location: [place] [facility];  Service: Cardiovascular;  Laterality: N/A;   CORONARY STENT INTERVENTION N/A 08/06/2021   Procedure: CORONARY STENT INTERVENTION;  Surgeon: Belva Crome, MD;  Location: [place] [facility];  Service: Cardiovascular;  Laterality: N/A;   CORONARY/GRAFT ACUTE MI REVASCULARIZATION N/A 02/20/2018   Procedure: Coronary/Graft Acute MI Revascularization;  Surgeon: Belva Crome, MD;  Location: [place] [facility];  Service: Cardiovascular;  Laterality: N/A;    ESOPHAGOGASTRODUODENOSCOPY  07/2014   h/o EE resolved, focal reflux esophagitis, chronic active gastritis   ESOPHAGOGASTRODUODENOSCOPY  11/2018   gastritis, no active crohn's ([place])   extremity surgery Left    ulnar nerve repair after schwannoma removal   FLEXIBLE SIGMOIDOSCOPY N/A 04/14/2018   Procedure: FLEXIBLE SIGMOIDOSCOPY;  Surgeon: Milus Banister, MD;  Location[facility];  Service: Endoscopy;  Laterality: N/A;   INGUINAL HERNIA REPAIR Bilateral 2017   LAMINECTOMY N/A 08/09/2018   Procedure: Lumbar three Laminectomy, excision of intradural tumor;  Surgeon: Kristeen Miss, MD;  Location: [place];  Service: Neurosurgery;  Laterality: N/A;   LEFT HEART CATH AND CORONARY ANGIOGRAPHY N/A 02/20/2018   Procedure: LEFT HEART CATH AND CORONARY ANGIOGRAPHY;  Surgeon: Belva Crome, MD;  Location: [place] [facility];  Service: Cardiovascular;  Laterality: N/A;   LEFT HEART CATH AND CORONARY ANGIOGRAPHY N/A 08/06/2021   Procedure: LEFT HEART CATH AND CORONARY ANGIOGRAPHY;  Surgeon: Belva Crome, MD;  Location: [place] [facility];  Service: Cardiovascular;  Laterality: N/A;   LUMBAR SPINE SURGERY  2011   POLYPECTOMY  04/09/2018   Procedure: POLYPECTOMY;  Surgeon: Doran Stabler, MD;  Location[facility];  Service: Gastroenterology;;   SPINAL CORD STIMULATOR IMPLANT  2012   x2 (Dr Berton Mount)   [place]  04/09/2018   Procedure: SUBMUCOSAL INJECTION;  Surgeon: Doran Stabler, MD;  Location: [place];  Service: Gastroenterology;;   TONSILLECTOMY  1996   TUMOR REMOVAL Left 2007   schwannoma from L armpit   UPPER GASTROINTESTINAL ENDOSCOPY      Current Medications: No outpatient medications have been marked as taking for the 11/25/21 encounter (Office Visit) with Belva Crome, MD.     Allergies:   Cephalexin and Duloxetine   Social History   Socioeconomic History   Marital status: Married    Spouse name: Mickel Baas   Number of children: 2   Years of education: 16    Highest education level: Bachelor's degree (e.g., BA, AB, BS)  Occupational History   Occupation: Self-employed    Comment: Patient owns 2 mattress stores  Tobacco Use   Smoking status: Some Days    Types: Cigars    Passive exposure: Never   Smokeless tobacco: Never  Vaping Use   Vaping Use: Never used  Substance and Sexual Activity   Alcohol use: Yes    Comment: Occasional   Drug use: No   Sexual activity: Not on file  Other Topics Concern   Not on file  Social History Narrative   Lives with wife, 1 dog. Grown children   Edu: college   Occ: owns mattress store   Activity: active at work, started [employer], wants to restart running   Diet: good water, fruits/vegetables daily   Social Determinants of  Health   Financial Resource Strain: Low Risk  (05/11/2018)   Overall Financial Resource Strain (CARDIA)    Difficulty of Paying Living Expenses: Not hard at all  Food Insecurity: No Food Insecurity (05/11/2018)   Hunger Vital Sign    Worried About Running Out of Food in the Last Year: Never true    Ran Out of Food in the Last Year: Never true  Transportation Needs: No Transportation Needs (05/11/2018)   PRAPARE - Hydrologist (Medical): No    Lack of Transportation (Non-Medical): No  Physical Activity: Inactive (05/11/2018)   Exercise Vital Sign    Days of Exercise per Week: 0 days    Minutes of Exercise per Session: 0 min  Stress: No Stress Concern Present (05/11/2018)   [place]    Feeling of Stress : Only a little  Social Connections: Not on file     Family History: The patient's family history includes CAD (age of onset: 58) in his father and mother; Colon cancer in his maternal grandmother; Congenital heart disease in his mother; Crohn's disease in his brother; Hyperlipidemia in his father and mother; Hypertension in his father and mother. There is no history of Diabetes,  Colon polyps, Esophageal cancer, Rectal cancer, or Stomach cancer.  ROS:   Please see the history of present illness.    Overall doing well.  Not much in the way of physical ailments.  Crohn's has been under good control.  All other systems reviewed and are negative.  EKGs/Labs/Other Studies Reviewed:    The following studies were reviewed today: CORONARY STENT 07/2021: Diagnostic Dominance: Right  Intervention     2D Doppler echocardiogram March 2023: IMPRESSIONS   1. Left ventricular ejection fraction, by estimation, is 35 to 40%. The  left ventricle has moderately decreased function. The left ventricle has  no regional wall motion abnormalities. The left ventricular internal  cavity size was moderately dilated.  Left ventricular diastolic parameters are consistent with Grade II  diastolic dysfunction (pseudonormalization). There is akinesis of the left  ventricular, entire apical segment. There is akinesis of the left  ventricular, mid inferoseptal wall and  anteroseptal wall. There is akinesis of the left ventricular, apical  septal wall and anterior wall.   2. Right ventricular systolic function is normal. The right ventricular  size is mildly enlarged. Tricuspid regurgitation signal is inadequate for  assessing PA pressure.   3. Left atrial size was mildly dilated.   4. The mitral valve is normal in structure. Mild mitral valve  regurgitation. No evidence of mitral stenosis.   5. The aortic valve is normal in structure. Aortic valve regurgitation is  not visualized. No aortic stenosis is present.   6. The inferior vena cava is normal in size with greater than 50%  respiratory variability, suggesting right atrial pressure of 3 mmHg.    EKG:  EKG not repeated  Recent Labs: 07/17/2021: NT-Pro BNP 470 08/21/2021: BUN 32; Creatinine, Ser 1.28; Hemoglobin 14.6; Platelets 314; Potassium 4.9; Sodium 138  Recent Lipid Panel    Component Value Date/Time   CHOL 105  07/16/2020 0844   CHOL 138 11/08/2019 0918   TRIG 110.0 07/16/2020 0844   HDL 35.90 (L) 07/16/2020 0844   HDL 30 (L) 11/08/2019 0918   CHOLHDL 3 07/16/2020 0844   VLDL 22.0 07/16/2020 0844   LDLCALC 47 07/16/2020 0844   LDLCALC 86 11/08/2019 0918    Physical Exam:  VS:  BP 118/78   Pulse 70   Ht 5' 7"  (1.702 m)   Wt 173 lb 6.4 oz (78.7 kg)   SpO2 99%   BMI 27.16 kg/m     Wt Readings from Last 3 Encounters:  11/25/21 173 lb 6.4 oz (78.7 kg)  10/17/21 172 lb 2 oz (78.1 kg)  08/21/21 169 lb 9.6 oz (76.9 kg)     GEN: Stable weight. No acute distress HEENT: Normal NECK: No JVD. LYMPHATICS: No lymphadenopathy CARDIAC: No murmur. RRR S4 but no S3 gallop, or edema. VASCULAR:  Normal Pulses. No bruits. RESPIRATORY:  Clear to auscultation without rales, wheezing or rhonchi  ABDOMEN: Soft, non-tender, non-distended, No pulsatile mass, MUSCULOSKELETAL: No deformity  SKIN: Warm and dry NEUROLOGIC:  Alert and oriented x 3 PSYCHIATRIC:  Normal affect   ASSESSMENT:    1. Coronary artery disease involving native coronary artery of native heart without angina pectoris   2. Hyperlipidemia LDL goal <70   3. Chronic systolic CHF (congestive heart failure) ([place])   4. Essential hypertension   5. OSA (obstructive sleep apnea)   6. Warfarin anticoagulation   7. DOE (dyspnea on exertion)    PLAN:    In order of problems listed above:  Stable with good activity level. Lipid panel today.  Target LDL less than 70 and ideally less than 55. Continue carvedilol, Wilder Glade, Entresto, and Aldactone.  Blood pressures run relatively soft and therefore we have not been able to uptitrate Entresto or carvedilol because of orthostatic dizziness.  2D Doppler echocardiogram will be done in front of the next office visit in 6 months. Relatively low but stable today. Compliant with CPAP. Warfarin has been discontinued Resolved after PCI of RCA   Overall education and awareness concerning secondary  risk prevention was discussed in detail: LDL less than 70, hemoglobin A1c less than 7, blood pressure target less than 130/80 mmHg, >150 minutes of moderate aerobic activity per week, avoidance of smoking, weight control (via diet and exercise), and continued surveillance/management of/for obstructive sleep apnea.  Guideline directed therapy for left ventricular systolic dysfunction: Angiotensin receptor-neprilysin inhibitor (ARNI)-Entresto; beta-blocker therapy - carvedilol, metoprolol succinate, or bisoprolol; mineralocorticoid receptor antagonist (MRA) therapy -spironolactone or eplerenone.  SGLT-2 agents -  Dapagliflozin Wilder Glade) or Empagliflozin ([place]).These therapies have been shown to improve clinical outcomes including reduction of rehospitalization, survival, and acute heart failure.     Medication Adjustments/Labs and Tests Ordered: Current medicines are reviewed at length with the patient today.  Concerns regarding medicines are outlined above.  No orders of the defined types were placed in this encounter.  No orders of the defined types were placed in this encounter.   There are no Patient Instructions on file for this visit.   Signed, Sinclair Grooms, MD  11/25/2021 8:40 AM    [place]

## 2021-11-25 ENCOUNTER — Encounter: Payer: Self-pay | Admitting: Interventional Cardiology

## 2021-11-25 ENCOUNTER — Ambulatory Visit: Payer: BC Managed Care – PPO | Attending: Interventional Cardiology | Admitting: Interventional Cardiology

## 2021-11-25 VITALS — BP 118/78 | HR 70 | Ht 67.0 in | Wt 173.4 lb

## 2021-11-25 DIAGNOSIS — I10 Essential (primary) hypertension: Secondary | ICD-10-CM

## 2021-11-25 DIAGNOSIS — I2510 Atherosclerotic heart disease of native coronary artery without angina pectoris: Secondary | ICD-10-CM

## 2021-11-25 DIAGNOSIS — Z7901 Long term (current) use of anticoagulants: Secondary | ICD-10-CM

## 2021-11-25 DIAGNOSIS — G4733 Obstructive sleep apnea (adult) (pediatric): Secondary | ICD-10-CM

## 2021-11-25 DIAGNOSIS — E785 Hyperlipidemia, unspecified: Secondary | ICD-10-CM | POA: Diagnosis not present

## 2021-11-25 DIAGNOSIS — I5022 Chronic systolic (congestive) heart failure: Secondary | ICD-10-CM | POA: Diagnosis not present

## 2021-11-25 DIAGNOSIS — R0609 Other forms of dyspnea: Secondary | ICD-10-CM

## 2021-11-25 LAB — COMPREHENSIVE METABOLIC PANEL
ALT: 36 IU/L (ref 0–44)
AST: 23 IU/L (ref 0–40)
Albumin/Globulin Ratio: 1.9 (ref 1.2–2.2)
Albumin: 4 g/dL (ref 3.8–4.9)
Alkaline Phosphatase: 51 IU/L (ref 44–121)
BUN/Creatinine Ratio: 14 (ref 9–20)
BUN: 18 mg/dL (ref 6–24)
Bilirubin Total: 0.5 mg/dL (ref 0.0–1.2)
CO2: 28 mmol/L (ref 20–29)
Calcium: 10.1 mg/dL (ref 8.7–10.2)
Chloride: 104 mmol/L (ref 96–106)
Creatinine, Ser: 1.32 mg/dL — ABNORMAL HIGH (ref 0.76–1.27)
Globulin, Total: 2.1 g/dL (ref 1.5–4.5)
Glucose: 98 mg/dL (ref 70–99)
Potassium: 4.7 mmol/L (ref 3.5–5.2)
Sodium: 144 mmol/L (ref 134–144)
Total Protein: 6.1 g/dL (ref 6.0–8.5)
eGFR: 64 mL/min/{1.73_m2} (ref >59)

## 2021-11-25 LAB — LIPID PANEL
Chol/HDL Ratio: 2.7 ratio (ref 0.0–5.0)
Cholesterol, Total: 116 mg/dL (ref 100–199)
HDL: 43 mg/dL (ref >39)
LDL Chol Calc (NIH): 58 mg/dL (ref 0–99)
Triglycerides: 72 mg/dL (ref 0–149)
VLDL Cholesterol Cal: 15 mg/dL (ref 5–40)

## 2021-11-25 NOTE — Patient Instructions (Signed)
Medication Instructions:  Your physician recommends that you continue on your current medications as directed. Please refer to the Current Medication list given to you today.  **Please review medication list and call our office at [phone] or send message through [place] to let us know if there are any differences between what you are currently taking and what is listed. Medication list is on the last page of this packet.**  *If you need a refill on your cardiac medications before your next appointment, please call your pharmacy*  Lab Work: TODAY: Lipid panel, CMET If you have labs (blood work) drawn today and your tests are completely normal, you will receive your results only by: [place] (if you have MyChart) OR A paper copy in the mail If you have any lab test that is abnormal or we need to change your treatment, we will call you to review the results.  Testing/Procedures: Your physician has requested that you have an echocardiogram in January 2024. Echocardiography is a painless test that uses sound waves to create images of your heart. It provides your doctor with information about the size and shape of your heart and how well your heart's chambers and valves are working. This procedure takes approximately one hour. There are no restrictions for this procedure.  Follow-Up: At [facility], you and your health needs are our priority.  As part of our continuing mission to provide you with exceptional heart care, we have created designated Provider Care Teams.  These Care Teams include your primary Cardiologist (physician) and Advanced Practice Providers (APPs -  Physician Assistants and Nurse Practitioners) who all work together to provide you with the care you need, when you need it.  Your next appointment:   6 month(s)  The format for your next appointment:   In Person  Provider:   Lauree Chandler, MD or Larae Grooms, MD     Important Information  About [place]

## 2021-11-26 NOTE — Telephone Encounter (Signed)
Lipid panel today.  Target LDL less than 70 and ideally less than 55. LDL Chol Calc (NIH) 0 - 99 mg/dL 58    Currently on Atorvastatin 80 mg PO QD, Zetia 10 mg, and Vascepa 2g.  Spouse wants to know if he needs to keep Lipid Clinic appointment.

## 2021-12-06 ENCOUNTER — Telehealth: Payer: Self-pay | Admitting: Interventional Cardiology

## 2021-12-06 DIAGNOSIS — K5090 Crohn's disease, unspecified, without complications: Secondary | ICD-10-CM | POA: Diagnosis not present

## 2021-12-06 NOTE — Telephone Encounter (Signed)
Notified Mickel Baas of Dr. Thompson Caul response: They should let the PCP know. I am glad they let us know. Not much we can do about this.    Mickel Baas verbalized understanding and expressed appreciation for follow-up.

## 2021-12-06 NOTE — Telephone Encounter (Signed)
Pt's spouse called to make provider aware that pt tested positive for Covid after coming back from vacation. She states that on 12/04/21 while still on vacation, pt's BP dropped really low and pt passed out. She states that pt is feeling better but wanted to make office aware. Please advise

## 2021-12-06 NOTE — Telephone Encounter (Signed)
Returned call to patient's spouse Mickel Baas (OK per [facility]).  Mickel Baas states she and patient were on vacation in [place] and he started "feeling bad" on 12/02/21, staying in their hotel room until 12/04/21. On that day (12/04/21) he and his wife walked to get something to eat and when they arrived at the food establishment Mickel Baas states patient looked pale, and was sweating. He sat down and stopped responding to her, she reports he defecated in his pants. She denies patient losing consciousness. She called EMS to evaluate him and was told his BP was "really low," no readings reported. Patient seen in ED in [place] and cleared for discharge after receiving IV fluids.   Reviewed medication list with Mickel Baas who manages patient's medications.  Mickel Baas states patient did take his Cialis the morning his BP dropped.  Patient tested positive for COVID and has finished his 5-day isolation today, Mickel Baas has now also test positive.   Mickel Baas reports patient has been staying hydrated with water and electrolyte drinks and he is feeling better today.  Will forward to Dr. Tamala Julian to review and advise.

## 2021-12-13 ENCOUNTER — Other Ambulatory Visit: Payer: Self-pay

## 2021-12-13 ENCOUNTER — Telehealth: Payer: Self-pay | Admitting: Internal Medicine

## 2021-12-13 DIAGNOSIS — K5080 Crohn's disease of both small and large intestine without complications: Secondary | ICD-10-CM

## 2021-12-13 MED ORDER — PREDNISONE 10 MG PO TABS: 10.0000 mg | ORAL_TABLET | Freq: Two times a day (BID) | ORAL | 0 refills | Status: DC

## 2021-12-13 NOTE — Telephone Encounter (Signed)
Pt wife calling to get refill on prednisone, she is requesting a call back.

## 2021-12-13 NOTE — Progress Notes (Signed)
Rx for prednisone sent per Dr Vena Rua verbal order.

## 2021-12-13 NOTE — Telephone Encounter (Signed)
Prescription has been sent. See order for details.

## 2021-12-27 ENCOUNTER — Telehealth: Payer: BC Managed Care – PPO | Admitting: Internal Medicine

## 2022-01-03 DIAGNOSIS — K5090 Crohn's disease, unspecified, without complications: Secondary | ICD-10-CM | POA: Diagnosis not present

## 2022-01-13 DIAGNOSIS — N401 Enlarged prostate with lower urinary tract symptoms: Secondary | ICD-10-CM | POA: Diagnosis not present

## 2022-01-13 DIAGNOSIS — R3912 Poor urinary stream: Secondary | ICD-10-CM | POA: Diagnosis not present

## 2022-01-18 ENCOUNTER — Other Ambulatory Visit: Payer: Self-pay | Admitting: Family Medicine

## 2022-01-19 ENCOUNTER — Other Ambulatory Visit: Payer: Self-pay | Admitting: Family Medicine

## 2022-01-19 DIAGNOSIS — C61 Malignant neoplasm of prostate: Secondary | ICD-10-CM

## 2022-01-19 DIAGNOSIS — K5080 Crohn's disease of both small and large intestine without complications: Secondary | ICD-10-CM

## 2022-01-19 DIAGNOSIS — E7841 Elevated Lipoprotein(a): Secondary | ICD-10-CM

## 2022-01-19 DIAGNOSIS — M818 Other osteoporosis without current pathological fracture: Secondary | ICD-10-CM

## 2022-01-19 DIAGNOSIS — E785 Hyperlipidemia, unspecified: Secondary | ICD-10-CM

## 2022-01-19 DIAGNOSIS — N1831 Chronic kidney disease, stage 3a: Secondary | ICD-10-CM

## 2022-01-20 ENCOUNTER — Other Ambulatory Visit: Payer: BC Managed Care – PPO

## 2022-01-20 DIAGNOSIS — R3912 Poor urinary stream: Secondary | ICD-10-CM | POA: Diagnosis not present

## 2022-01-20 DIAGNOSIS — N401 Enlarged prostate with lower urinary tract symptoms: Secondary | ICD-10-CM | POA: Diagnosis not present

## 2022-01-20 DIAGNOSIS — C61 Malignant neoplasm of prostate: Secondary | ICD-10-CM | POA: Diagnosis not present

## 2022-01-21 ENCOUNTER — Other Ambulatory Visit (INDEPENDENT_AMBULATORY_CARE_PROVIDER_SITE_OTHER): Payer: BC Managed Care – PPO

## 2022-01-21 DIAGNOSIS — M818 Other osteoporosis without current pathological fracture: Secondary | ICD-10-CM

## 2022-01-21 DIAGNOSIS — K5080 Crohn's disease of both small and large intestine without complications: Secondary | ICD-10-CM | POA: Diagnosis not present

## 2022-01-21 DIAGNOSIS — N1831 Chronic kidney disease, stage 3a: Secondary | ICD-10-CM | POA: Diagnosis not present

## 2022-01-21 DIAGNOSIS — C61 Malignant neoplasm of prostate: Secondary | ICD-10-CM

## 2022-01-21 LAB — CBC WITH DIFFERENTIAL/PLATELET
Basophils Absolute: 0.1 10*3/uL (ref 0.0–0.1)
Basophils Relative: 0.9 % (ref 0.0–3.0)
Eosinophils Absolute: 0.4 10*3/uL (ref 0.0–0.7)
Eosinophils Relative: 4.2 % (ref 0.0–5.0)
HCT: 45.6 % (ref 39.0–52.0)
Hemoglobin: 14.6 g/dL (ref 13.0–17.0)
Lymphocytes Relative: 24.1 % (ref 12.0–46.0)
Lymphs Abs: 2.3 10*3/uL (ref 0.7–4.0)
MCHC: 32 g/dL (ref 30.0–36.0)
MCV: 93.7 fl (ref 78.0–100.0)
Monocytes Absolute: 1.4 10*3/uL — ABNORMAL HIGH (ref 0.1–1.0)
Monocytes Relative: 14.4 % — ABNORMAL HIGH (ref 3.0–12.0)
Neutro Abs: 5.3 10*3/uL (ref 1.4–7.7)
Neutrophils Relative %: 56.4 % (ref 43.0–77.0)
Platelets: 277 10*3/uL (ref 150.0–400.0)
RBC: 4.86 Mil/uL (ref 4.22–5.81)
RDW: 16.1 % — ABNORMAL HIGH (ref 11.5–15.5)
WBC: 9.5 10*3/uL (ref 4.0–10.5)

## 2022-01-21 LAB — RENAL FUNCTION PANEL
Albumin: 3.8 g/dL (ref 3.5–5.2)
BUN: 36 mg/dL — ABNORMAL HIGH (ref 6–23)
CO2: 29 mEq/L (ref 19–32)
Calcium: 9.2 mg/dL (ref 8.4–10.5)
Chloride: 106 mEq/L (ref 96–112)
Creatinine, Ser: 1.23 mg/dL (ref 0.40–1.50)
GFR: 66.57 mL/min (ref >60.00)
Glucose, Bld: 87 mg/dL (ref 70–99)
Phosphorus: 4.7 mg/dL — ABNORMAL HIGH (ref 2.3–4.6)
Potassium: 4.9 mEq/L (ref 3.5–5.1)
Sodium: 143 mEq/L (ref 135–145)

## 2022-01-21 LAB — MICROALBUMIN / CREATININE URINE RATIO
Creatinine,U: 109.6 mg/dL
Microalb Creat Ratio: 0.6 mg/g (ref 0.0–30.0)
Microalb, Ur: <0.7 mg/dL (ref 0.0–1.9)

## 2022-01-21 LAB — VITAMIN D 25 HYDROXY (VIT D DEFICIENCY, FRACTURES): VITD: 58.7 ng/mL (ref 30.00–100.00)

## 2022-01-21 LAB — PSA: PSA: 2.13 ng/mL (ref 0.10–4.00)

## 2022-01-22 LAB — PARATHYROID HORMONE, INTACT (NO CA): PTH: 27 pg/mL (ref 16–77)

## 2022-01-27 ENCOUNTER — Ambulatory Visit (INDEPENDENT_AMBULATORY_CARE_PROVIDER_SITE_OTHER): Payer: BC Managed Care – PPO | Admitting: Family Medicine

## 2022-01-27 ENCOUNTER — Encounter: Payer: Self-pay | Admitting: Family Medicine

## 2022-01-27 VITALS — BP 120/64 | HR 70 | Temp 97.7°F | Ht 66.25 in | Wt 175.0 lb

## 2022-01-27 DIAGNOSIS — Z0000 Encounter for general adult medical examination without abnormal findings: Secondary | ICD-10-CM

## 2022-01-27 DIAGNOSIS — N401 Enlarged prostate with lower urinary tract symptoms: Secondary | ICD-10-CM

## 2022-01-27 DIAGNOSIS — K5080 Crohn's disease of both small and large intestine without complications: Secondary | ICD-10-CM

## 2022-01-27 DIAGNOSIS — K625 Hemorrhage of anus and rectum: Secondary | ICD-10-CM

## 2022-01-27 DIAGNOSIS — I2510 Atherosclerotic heart disease of native coronary artery without angina pectoris: Secondary | ICD-10-CM

## 2022-01-27 DIAGNOSIS — C61 Malignant neoplasm of prostate: Secondary | ICD-10-CM

## 2022-01-27 DIAGNOSIS — R350 Frequency of micturition: Secondary | ICD-10-CM

## 2022-01-27 DIAGNOSIS — Z23 Encounter for immunization: Secondary | ICD-10-CM | POA: Diagnosis not present

## 2022-01-27 DIAGNOSIS — D72119 Hypereosinophilic syndrome (hes), unspecified: Secondary | ICD-10-CM

## 2022-01-27 DIAGNOSIS — M818 Other osteoporosis without current pathological fracture: Secondary | ICD-10-CM

## 2022-01-27 DIAGNOSIS — I77819 Aortic ectasia, unspecified site: Secondary | ICD-10-CM | POA: Diagnosis not present

## 2022-01-27 DIAGNOSIS — E785 Hyperlipidemia, unspecified: Secondary | ICD-10-CM

## 2022-01-27 DIAGNOSIS — G4733 Obstructive sleep apnea (adult) (pediatric): Secondary | ICD-10-CM

## 2022-01-27 DIAGNOSIS — N1831 Chronic kidney disease, stage 3a: Secondary | ICD-10-CM

## 2022-01-27 DIAGNOSIS — I10 Essential (primary) hypertension: Secondary | ICD-10-CM

## 2022-01-27 DIAGNOSIS — N529 Male erectile dysfunction, unspecified: Secondary | ICD-10-CM

## 2022-01-27 DIAGNOSIS — I5020 Unspecified systolic (congestive) heart failure: Secondary | ICD-10-CM

## 2022-01-27 DIAGNOSIS — I255 Ischemic cardiomyopathy: Secondary | ICD-10-CM

## 2022-01-27 MED ORDER — AMITRIPTYLINE HCL 25 MG PO TABS
25.0000 mg | ORAL_TABLET | Freq: Every evening | ORAL | 3 refills | Status: DC | PRN
  Filled 2022-11-05 (×2): qty 90, 90d supply, fill #0

## 2022-01-27 MED ORDER — TADALAFIL 20 MG PO TABS
20.0000 mg | ORAL_TABLET | Freq: Every day | ORAL | 6 refills | Status: DC | PRN
  Filled 2022-11-05: qty 20, 20d supply, fill #0

## 2022-01-27 NOTE — Assessment & Plan Note (Addendum)
Chronic, stable on high intensity statin, zetia, vascepa through cardiology.  The ASCVD Risk score (Arnett DK, et al., 2019) failed to calculate for the following reasons:   The patient has a prior MI or stroke diagnosis

## 2022-01-27 NOTE — Assessment & Plan Note (Signed)
Preventative protocols reviewed and updated unless pt declined. Discussed healthy diet and lifestyle.  

## 2022-01-27 NOTE — Assessment & Plan Note (Signed)
Requests cialis refilled.

## 2022-01-27 NOTE — Progress Notes (Addendum)
Patient ID: Jack [name], male    DOB: 1965/04/12, 56 y.o.   MRN: [id number]  This visit was conducted in person.  BP 120/64   Pulse 70   Temp 97.7 F (36.5 C) (Temporal)   Ht 5' 6.25" (1.683 m)   Wt 175 lb (79.4 kg)   SpO2 98%   BMI 28.03 kg/m    CC: CPE Subjective:   HPI: Jack [name] is a 56 y.o. male presenting on 01/27/2022 for Annual Exam   Known hypereosinophilic syndrome (churg strauss and EoE), seizure disorder, crohn's disease, and CAD s/p STEMI of LAD 01/2018 s/p DES with resultant ischemic cardiomyopathy ER 35% with LV thrombus s/p coumadin now only on plavix due to GI bleed. Had further coronary revascularization 07/2021. He did have lumbar spine schwannoma removed (Jack [name]).   Crohn's disease - followed by GI Dr Jack [name]. Managed with Entyvio 376m Q4 wks and azathioprine 1561mdaily. Planned surveillance colonoscopy for ~07/2022.   [facility].  Sees [place] rheumatology (SDossie Der On prednisone daily currently 1911maily - hasn't been able to wean past 37m67mees rheum yearly.   COVID infection 10/2021.    Preventative: COLONOSCOPY during hospitalization 01/2015 - active ileitis/colitis, f/u 2 yrs (Jack [name]). Has established with Dr PyrtHilarie FredricksonONOSCOPY WITH PROPOFOL 04/09/2018 - inflammatory polyp, f/u left to primary GI - Jack [name] (DanLoletha CarrownrEstill Cotta, MD) COLONOSCOPY 11/2018 multiple polyps, 1 TA, rpt 2 yrs (Jack [name]) Prostate cancer - [place] uro ([place], also on finasteride for BPH symptoms.  DEXA scan - 11/2015 T -2.9 Osteoporosis. Takes calcium/vit D with fosamax.  [facility] 06/2017 [facility]  Lung cancer screen - not eligible  Flu shot yearly  COVID vaccine - Pfizer 05/2019, 06/2019, booster 02/2020 Tdap 09/2021 Pneumovax 2017, 07/2018, prevnar-13 12/2017 Shingrix - 06/2020, 09/2020 Seat belt use discussed Sunscreen use discussed. No changing moles on skin. Planning to see derm this year  [place] [facility] - averaging 7-8 hours/night  Non smoker. Rare cigar.  Alcohol - occasionally  Dentist q6 mo Eye exam as needed  Lives with wife, 1 dog. Grown children  Edu: college  Occ: owns mattress store  Activity: treadmill 5d/wk 30 min Diet: good water, fruits/vegetables daily      Relevant past medical, surgical, family and social history reviewed and updated as indicated. Interim medical history since our last visit reviewed. Allergies and medications reviewed and updated. Outpatient Medications Prior to Visit  Medication Sig Dispense Refill   acetaminophen (TYLENOL) 325 MG tablet Take 2 tablets (650 mg total) by mouth every 4 (four) hours as needed for headache or mild pain.     alendronate (FOSAMAX) 70 MG tablet Take 70 mg by mouth every Saturday.   11   aspirin 81 MG chewable tablet Chew 1 tablet (81 mg total) by mouth daily.     atorvastatin (LIPITOR) 80 MG tablet TAKE 1 TABLET BY MOUTH DAILY AT 6 PM. 90 tablet 3   azaTHIOprine (IMURAN) 50 MG tablet TAKE 3 TABLETS BY MOUTH EVERY DAY 270 tablet 1   Calcium Carbonate-Vitamin D3 (CALCIUM 600/VITAMIN D) 600-400 MG-UNIT TABS Take 1 tablet by mouth daily.     carvedilol (COREG) 3.125 MG tablet Take 1 tablet (3.125 mg total) by mouth 2 (two) times daily. 180 tablet 3   clopidogrel (PLAVIX) 75 MG tablet TAKE 1 TABLET BY MOUTH EVERY DAY 90 tablet 3   dapagliflozin propanediol (FARXIGA) 10  MG TABS tablet Take 1 tablet (10 mg total) by mouth daily before breakfast. 30 tablet 11   diphenoxylate-atropine (LOMOTIL) 2.5-0.025 MG tablet TAKE 1-2 TABLETS BY MOUTH 4 TIMES A DAY AS NEEDED FOR DIARRHEA 90 tablet 2   ezetimibe (ZETIA) 10 MG tablet TAKE 1 TABLET BY MOUTH EVERY DAY 90 tablet 3   finasteride (PROSCAR) 5 MG tablet Take 5 mg by mouth at bedtime.     icosapent Ethyl (VASCEPA) 1 g capsule TAKE 2 CAPSULES BY MOUTH 2 TIMES DAILY. 120 capsule 11   MAGNESIUM PO Take 1-2 tablets by mouth See admin instructions. Alternate taking 1 tablet  one night and 2 tablets the next night     Multiple Vitamin (MULTIVITAMIN) capsule Take 2 capsules by mouth daily.     nitroGLYCERIN (NITROSTAT) 0.4 MG SL tablet Place 1 tablet (0.4 mg total) under the tongue every 5 (five) minutes as needed for chest pain. 25 tablet 4   pantoprazole (PROTONIX) 40 MG tablet TAKE 1 TABLET BY MOUTH EVERY DAY 90 tablet 0   predniSONE (DELTASONE) 10 MG tablet Take as directed (Currently taking 21 mg daily- decrease by 1 mg as tolerated) 100 tablet 0   predniSONE (DELTASONE) 5 MG tablet Take as directed. (currently on 21 mg daily- decrease by 1 mg as tolerated) 100 tablet 0   sacubitril-valsartan (ENTRESTO) 24-26 MG Take 1 tablet by mouth 2 (two) times daily. 180 tablet 3   silodosin (RAPAFLO) 8 MG CAPS capsule Take 1 capsule (8 mg total) by mouth daily with breakfast.     spironolactone (ALDACTONE) 25 MG tablet Take 0.5 tablets (12.5 mg total) by mouth every Monday, Wednesday, and Friday. 20 tablet 3   vedolizumab (ENTYVIO) 300 MG injection Inject 300 mg into the vein every 28 (twenty-eight) days.     amitriptyline (ELAVIL) 25 MG tablet TAKE 1 TABLET (25 MG TOTAL) BY MOUTH AT BEDTIME AS NEEDED FOR SLEEP. 90 tablet 0   tadalafil (CIALIS) 20 MG tablet TAKE ONE-HALF TO ONE TABLET BY MOUTH ONE TIME DAILY FOR FOR ERECTILE DYSFUNCTION 20 tablet 0   Facility-Administered Medications Prior to Visit  Medication Dose Route Frequency Provider Last Rate Last Admin   ondansetron (ZOFRAN) 4 mg in sodium chloride 0.9 % 50 mL IVPB  4 mg Intravenous Q6H PRN Kristeen Miss, MD         Per HPI unless specifically indicated in ROS section below Review of Systems  Constitutional:  Negative for activity change, appetite change, chills, fatigue, fever and unexpected weight change.  HENT:  Negative for hearing loss.   Eyes:  Negative for visual disturbance.  Respiratory:  Negative for cough, chest tightness, shortness of breath and wheezing.   Cardiovascular:  Negative for chest pain,  palpitations and leg swelling.  Gastrointestinal:  Negative for abdominal distention, abdominal pain, blood in stool, constipation, diarrhea, nausea and vomiting.  Genitourinary:  Negative for difficulty urinating and hematuria.  Musculoskeletal:  Negative for arthralgias, myalgias and neck pain.  Skin:  Negative for rash.  Neurological:  Negative for dizziness, seizures, syncope and headaches.  Hematological:  Negative for adenopathy. Bruises/bleeds easily.  Psychiatric/Behavioral:  Negative for dysphoric mood. The patient is not nervous/anxious.     Objective:  BP 120/64   Pulse 70   Temp 97.7 F (36.5 C) (Temporal)   Ht 5' 6.25" (1.683 m)   Wt 175 lb (79.4 kg)   SpO2 98%   BMI 28.03 kg/m   Wt Readings from Last 3 Encounters:  01/27/22 175 lb (  79.4 kg)  11/25/21 173 lb 6.4 oz (78.7 kg)  10/17/21 172 lb 2 oz (78.1 kg)      Physical Exam Vitals and nursing note reviewed.  Constitutional:      General: He is not in acute distress.    Appearance: Normal appearance. He is well-developed. He is not ill-appearing.  HENT:     Head: Normocephalic and atraumatic.     Right Ear: Hearing, tympanic membrane, ear canal and external ear normal.     Left Ear: Hearing, tympanic membrane, ear canal and external ear normal.  Eyes:     General: No scleral icterus.    Extraocular Movements: Extraocular movements intact.     Conjunctiva/sclera: Conjunctivae normal.     Pupils: Pupils are equal, round, and reactive to light.  Neck:     Thyroid: No thyroid mass or thyromegaly.  Cardiovascular:     Rate and Rhythm: Normal rate and regular rhythm.     Pulses: Normal pulses.          Radial pulses are 2+ on the right side and 2+ on the left side.     Heart sounds: Normal heart sounds. No murmur heard. Pulmonary:     Effort: Pulmonary effort is normal. No respiratory distress.     Breath sounds: Normal breath sounds. No wheezing, rhonchi or rales.  Abdominal:     General: Bowel sounds are  normal. There is no distension.     Palpations: Abdomen is soft. There is no mass.     Tenderness: There is no abdominal tenderness. There is no guarding or rebound.     Hernia: No hernia is present.  Musculoskeletal:        General: Normal range of motion.     Cervical back: Normal range of motion and neck supple.     Right lower leg: No edema.     Left lower leg: No edema.  Lymphadenopathy:     Cervical: No cervical adenopathy.  Skin:    General: Skin is warm and dry.     Findings: No rash.  Neurological:     General: No focal deficit present.     Mental Status: He is alert and oriented to person, place, and time.  Psychiatric:        Mood and Affect: Mood normal.        Behavior: Behavior normal.        Thought Content: Thought content normal.        Judgment: Judgment normal.       Results for orders placed or performed in visit on 01/21/22  Renal function panel  Result Value Ref Range   Sodium 143 135 - 145 mEq/L   Potassium 4.9 3.5 - 5.1 mEq/L   Chloride 106 96 - 112 mEq/L   CO2 29 19 - 32 mEq/L   Albumin 3.8 3.5 - 5.2 g/dL   BUN 36 (H) 6 - 23 mg/dL   Creatinine, Ser 1.23 0.40 - 1.50 mg/dL   Glucose, Bld 87 70 - 99 mg/dL   Phosphorus 4.7 (H) 2.3 - 4.6 mg/dL   GFR 66.57 >60.00 mL/min   Calcium 9.2 8.4 - 10.5 mg/dL  Parathyroid hormone, intact (no Ca)  Result Value Ref Range   PTH 27 16 - 77 pg/mL  VITAMIN D 25 Hydroxy (Vit-D Deficiency, Fractures)  Result Value Ref Range   VITD 58.70 30.00 - 100.00 ng/mL  Microalbumin / creatinine urine ratio  Result Value Ref Range   Microalb, Ur <0.7  0.0 - 1.9 mg/dL   Creatinine,U 109.6 mg/dL   Microalb Creat Ratio 0.6 0.0 - 30.0 mg/g  CBC with Differential/Platelet  Result Value Ref Range   WBC 9.5 4.0 - 10.5 K/uL   RBC 4.86 4.22 - 5.81 Mil/uL   Hemoglobin 14.6 13.0 - 17.0 g/dL   HCT 45.6 39.0 - 52.0 %   MCV 93.7 78.0 - 100.0 fl   MCHC 32.0 30.0 - 36.0 g/dL   RDW 16.1 (H) 11.5 - 15.5 %   Platelets 277.0 150.0 - 400.0  K/uL   Neutrophils Relative % 56.4 43.0 - 77.0 %   Lymphocytes Relative 24.1 12.0 - 46.0 %   Monocytes Relative 14.4 (H) 3.0 - 12.0 %   Eosinophils Relative 4.2 0.0 - 5.0 %   Basophils Relative 0.9 0.0 - 3.0 %   Neutro Abs 5.3 1.4 - 7.7 K/uL   Lymphs Abs 2.3 0.7 - 4.0 K/uL   Monocytes Absolute 1.4 (H) 0.1 - 1.0 K/uL   Eosinophils Absolute 0.4 0.0 - 0.7 K/uL   Basophils Absolute 0.1 0.0 - 0.1 K/uL  PSA  Result Value Ref Range   PSA 2.13 0.10 - 4.00 ng/mL    Assessment & Plan:   Problem List Items Addressed This Visit     Essential hypertension (Chronic)    Chronic, stable on current regimen through cardiology.       Relevant Medications   tadalafil (CIALIS) 20 MG tablet   Healthcare maintenance - Primary (Chronic)    Preventative protocols reviewed and updated unless pt declined. Discussed healthy diet and lifestyle.       Hyperlipidemia LDL goal <70 (Chronic)    Chronic, stable on high intensity statin, zetia, vascepa through cardiology.  The ASCVD Risk score (Arnett DK, et al., 2019) failed to calculate for the following reasons:   The patient has a prior MI or stroke diagnosis       Relevant Medications   tadalafil (CIALIS) 20 MG tablet   HFrEF (heart failure with reduced ejection fraction) (HCC) (Chronic)    Appreciate cards care.       Relevant Medications   tadalafil (CIALIS) 20 MG tablet   CAD (coronary artery disease) (Chronic)   Relevant Medications   tadalafil (CIALIS) 20 MG tablet   Crohn disease (HCC)    Appreciate GI care - sees Dr Jack [name] regularly.       Benign prostatic hyperplasia    Regularly sees urology Dr Milford Cage, on rapaflo and finasteride      Hypereosinophilic syndrome    Sees [facility] Dossie Der) yearly, on [place] and daily prednisone.       Osteoporosis    On chronic prednisone therapy, continues fosamax through rheumatology. Will request latest DEXA scan.       GIB (gastrointestinal bleeding)    Continues plavix along with  pantoprazole 40m daily.       Ischemic cardiomyopathy   Relevant Medications   tadalafil (CIALIS) 20 MG tablet   OSA (obstructive sleep apnea)    Has not used CPAP.       CKD (chronic kidney disease) stage 3, GFR 30-59 ml/min    Reviewed kidney function with patient, encouraged increased water intake.       Prostate cancer ([facility]    Regularly sees urology Q6 months.  On finasteride.       Aortic dilatation (HCC)   Relevant Medications   tadalafil (CIALIS) 20 MG tablet   Erectile dysfunction    Requests cialis refilled.  Other Visit Diagnoses     Need for influenza vaccination       Relevant Orders   Flu Vaccine QUAD 78moIM (Fluarix, Fluzone & Alfiuria Quad PF) (Completed)        Meds ordered this encounter  Medications   tadalafil (CIALIS) 20 MG tablet    Sig: Take 1 tablet (20 mg total) by mouth daily as needed for erectile dysfunction.    Dispense:  20 tablet    Refill:  6   amitriptyline (ELAVIL) 25 MG tablet    Sig: Take 1 tablet (25 mg total) by mouth at bedtime as needed for sleep.    Dispense:  90 tablet    Refill:  3   Orders Placed This Encounter  Procedures   Flu Vaccine QUAD 617moM (Fluarix, Fluzone & Alfiuria Quad PF)    Patient instructions:  Flu shot today Cialis refilled - can't take with nitroglycerin  You are doing well today Return as needed or in 1 year for next physical.   Follow up plan: Return in about 1 year (around 01/28/2023) for annual exam, prior fasting for blood work.  JaRia BushMD

## 2022-01-27 NOTE — Assessment & Plan Note (Signed)
Appreciate GI care - sees Dr [name] regularly.

## 2022-01-27 NOTE — Assessment & Plan Note (Signed)
Regularly sees urology Dr Milford Cage, on rapaflo and finasteride

## 2022-01-27 NOTE — Assessment & Plan Note (Addendum)
Chronic, stable on current regimen through cardiology.

## 2022-01-27 NOTE — Assessment & Plan Note (Signed)
Has not used CPAP.

## 2022-01-27 NOTE — Assessment & Plan Note (Signed)
Reviewed kidney function with patient, encouraged increased water intake.

## 2022-01-27 NOTE — Assessment & Plan Note (Addendum)
On chronic prednisone therapy, continues fosamax through rheumatology. Will request latest DEXA scan.

## 2022-01-27 NOTE — Assessment & Plan Note (Signed)
Appreciate cards care.

## 2022-01-27 NOTE — Patient Instructions (Signed)
Flu shot today Cialis refilled - can't take with nitroglycerin  You are doing well today Return as needed or in 1 year for next physical.   Health Maintenance, Male Adopting a healthy lifestyle and getting preventive care are important in promoting health and wellness. Ask your health care provider about: The right schedule for you to have regular tests and exams. Things you can do on your own to prevent diseases and keep yourself healthy. What should I know about diet, weight, and exercise? Eat a healthy diet  Eat a diet that includes plenty of vegetables, fruits, low-fat dairy products, and lean protein. Do not eat a lot of foods that are high in solid fats, added sugars, or sodium. Maintain a healthy weight Body mass index (BMI) is a measurement that can be used to identify possible weight problems. It estimates body fat based on height and weight. Your health care provider can help determine your BMI and help you achieve or maintain a healthy weight. Get regular exercise Get regular exercise. This is one of the most important things you can do for your health. Most adults should: Exercise for at least 150 minutes each week. The exercise should increase your heart rate and make you sweat (moderate-intensity exercise). Do strengthening exercises at least twice a week. This is in addition to the moderate-intensity exercise. Spend less time sitting. Even light physical activity can be beneficial. Watch cholesterol and blood lipids Have your blood tested for lipids and cholesterol at 54 years of age, then have this test every 5 years. You may need to have your cholesterol levels checked more often if: Your lipid or cholesterol levels are high. You are older than 54 years of age. You are at high risk for heart disease. What should I know about cancer screening? Many types of cancers can be detected early and may often be prevented. Depending on your health history and family history, you  may need to have cancer screening at various ages. This may include screening for: Colorectal cancer. Prostate cancer. Skin cancer. Lung cancer. What should I know about heart disease, diabetes, and high blood pressure? Blood pressure and heart disease High blood pressure causes heart disease and increases the risk of stroke. This is more likely to develop in people who have high blood pressure readings or are overweight. Talk with your health care provider about your target blood pressure readings. Have your blood pressure checked: Every 3-5 years if you are 65-53 years of age. Every year if you are 106 years old or older. If you are between the ages of 53 and 53 and are a current or former smoker, ask your health care provider if you should have a one-time screening for abdominal aortic aneurysm (AAA). Diabetes Have regular diabetes screenings. This checks your fasting blood sugar level. Have the screening done: Once every three years after age 40 if you are at a normal weight and have a low risk for diabetes. More often and at a younger age if you are overweight or have a high risk for diabetes. What should I know about preventing infection? Hepatitis B If you have a higher risk for hepatitis B, you should be screened for this virus. Talk with your health care provider to find out if you are at risk for hepatitis B infection. Hepatitis C Blood testing is recommended for: Everyone born from [birth year] through 1965. Anyone with known risk factors for hepatitis C. Sexually transmitted infections (STIs) You should be screened each year for  STIs, including gonorrhea and chlamydia, if: You are sexually active and are younger than 54 years of age. You are older than 54 years of age and your health care provider tells you that you are at risk for this type of infection. Your sexual activity has changed since you were last screened, and you are at increased risk for chlamydia or gonorrhea. Ask your  health care provider if you are at risk. Ask your health care provider about whether you are at high risk for HIV. Your health care provider may recommend a prescription medicine to help prevent HIV infection. If you choose to take medicine to prevent HIV, you should first get tested for HIV. You should then be tested every 3 months for as long as you are taking the medicine. Follow these instructions at home: Alcohol use Do not drink alcohol if your health care provider tells you not to drink. If you drink alcohol: Limit how much you have to 0-2 drinks a day. Know how much alcohol is in your drink. In the [place] one drink equals one 12 oz bottle of beer (355 mL), one 5 oz glass of wine (148 mL), or one 1 oz glass of hard liquor (44 mL). Lifestyle Do not use any products that contain nicotine or tobacco. These products include cigarettes, chewing tobacco, and vaping devices, such as e-cigarettes. If you need help quitting, ask your health care provider. Do not use street drugs. Do not share needles. Ask your health care provider for help if you need support or information about quitting drugs. General instructions Schedule regular health, dental, and eye exams. Stay current with your vaccines. Tell your health care provider if: You often feel depressed. You have ever been abused or do not feel safe at home. Summary Adopting a healthy lifestyle and getting preventive care are important in promoting health and wellness. Follow your health care provider's instructions about healthy diet, exercising, and getting tested or screened for diseases. Follow your health care provider's instructions on monitoring your cholesterol and blood pressure. This information is not intended to replace advice given to you by your health care provider. Make sure you discuss any questions you have with your health care provider. Document Revised: 08/06/2020 Document Reviewed: 08/06/2020 Elsevier Patient Education   [place].

## 2022-01-27 NOTE — Assessment & Plan Note (Signed)
Regularly sees urology Q6 months.  On finasteride.

## 2022-01-27 NOTE — Assessment & Plan Note (Signed)
Sees [facility] rheumatology Dossie Der) yearly, on [place] and daily prednisone.

## 2022-01-27 NOTE — Assessment & Plan Note (Signed)
Continues plavix along with pantoprazole 70m daily.

## 2022-02-03 DIAGNOSIS — K5090 Crohn's disease, unspecified, without complications: Secondary | ICD-10-CM | POA: Diagnosis not present

## 2022-02-05 DIAGNOSIS — H2513 Age-related nuclear cataract, bilateral: Secondary | ICD-10-CM | POA: Diagnosis not present

## 2022-02-12 ENCOUNTER — Observation Stay (HOSPITAL_BASED_OUTPATIENT_CLINIC_OR_DEPARTMENT_OTHER)
Admission: EM | Admit: 2022-02-12 | Discharge: 2022-02-14 | Disposition: A | Discharge status: Home / Self Care | Payer: BC Managed Care – PPO | Attending: Emergency Medicine | Admitting: Emergency Medicine

## 2022-02-12 ENCOUNTER — Encounter (HOSPITAL_COMMUNITY): Payer: Self-pay

## 2022-02-12 ENCOUNTER — Other Ambulatory Visit: Payer: Self-pay

## 2022-02-12 ENCOUNTER — Encounter (HOSPITAL_BASED_OUTPATIENT_CLINIC_OR_DEPARTMENT_OTHER): Payer: Self-pay

## 2022-02-12 DIAGNOSIS — T45515A Adverse effect of anticoagulants, initial encounter: Secondary | ICD-10-CM | POA: Diagnosis present

## 2022-02-12 DIAGNOSIS — Z7984 Long term (current) use of oral hypoglycemic drugs: Secondary | ICD-10-CM | POA: Diagnosis not present

## 2022-02-12 DIAGNOSIS — K219 Gastro-esophageal reflux disease without esophagitis: Secondary | ICD-10-CM | POA: Diagnosis present

## 2022-02-12 DIAGNOSIS — D62 Acute posthemorrhagic anemia: Secondary | ICD-10-CM | POA: Diagnosis not present

## 2022-02-12 DIAGNOSIS — I130 Hypertensive heart and chronic kidney disease with heart failure and stage 1 through stage 4 chronic kidney disease, or unspecified chronic kidney disease: Secondary | ICD-10-CM | POA: Diagnosis not present

## 2022-02-12 DIAGNOSIS — D509 Iron deficiency anemia, unspecified: Secondary | ICD-10-CM | POA: Diagnosis present

## 2022-02-12 DIAGNOSIS — Z9682 Presence of neurostimulator: Secondary | ICD-10-CM

## 2022-02-12 DIAGNOSIS — Z888 Allergy status to other drugs, medicaments and biological substances status: Secondary | ICD-10-CM

## 2022-02-12 DIAGNOSIS — Z7982 Long term (current) use of aspirin: Secondary | ICD-10-CM | POA: Diagnosis not present

## 2022-02-12 DIAGNOSIS — E785 Hyperlipidemia, unspecified: Secondary | ICD-10-CM | POA: Diagnosis present

## 2022-02-12 DIAGNOSIS — Z7952 Long term (current) use of systemic steroids: Secondary | ICD-10-CM

## 2022-02-12 DIAGNOSIS — K5080 Crohn's disease of both small and large intestine without complications: Secondary | ICD-10-CM | POA: Diagnosis present

## 2022-02-12 DIAGNOSIS — Z955 Presence of coronary angioplasty implant and graft: Secondary | ICD-10-CM | POA: Diagnosis not present

## 2022-02-12 DIAGNOSIS — I10 Essential (primary) hypertension: Secondary | ICD-10-CM | POA: Diagnosis present

## 2022-02-12 DIAGNOSIS — Z79899 Other long term (current) drug therapy: Secondary | ICD-10-CM | POA: Diagnosis not present

## 2022-02-12 DIAGNOSIS — F17290 Nicotine dependence, other tobacco product, uncomplicated: Secondary | ICD-10-CM | POA: Diagnosis not present

## 2022-02-12 DIAGNOSIS — I5022 Chronic systolic (congestive) heart failure: Secondary | ICD-10-CM | POA: Diagnosis not present

## 2022-02-12 DIAGNOSIS — Z8249 Family history of ischemic heart disease and other diseases of the circulatory system: Secondary | ICD-10-CM

## 2022-02-12 DIAGNOSIS — K4020 Bilateral inguinal hernia, without obstruction or gangrene, not specified as recurrent: Secondary | ICD-10-CM | POA: Diagnosis present

## 2022-02-12 DIAGNOSIS — Z8546 Personal history of malignant neoplasm of prostate: Secondary | ICD-10-CM | POA: Diagnosis not present

## 2022-02-12 DIAGNOSIS — M301 Polyarteritis with lung involvement [Churg-Strauss]: Secondary | ICD-10-CM | POA: Diagnosis present

## 2022-02-12 DIAGNOSIS — K2970 Gastritis, unspecified, without bleeding: Secondary | ICD-10-CM | POA: Diagnosis present

## 2022-02-12 DIAGNOSIS — N183 Chronic kidney disease, stage 3 unspecified: Secondary | ICD-10-CM | POA: Diagnosis not present

## 2022-02-12 DIAGNOSIS — D126 Benign neoplasm of colon, unspecified: Secondary | ICD-10-CM | POA: Diagnosis not present

## 2022-02-12 DIAGNOSIS — K625 Hemorrhage of anus and rectum: Secondary | ICD-10-CM | POA: Diagnosis not present

## 2022-02-12 DIAGNOSIS — Z800 Family history of malignant neoplasm of digestive organs: Secondary | ICD-10-CM

## 2022-02-12 DIAGNOSIS — Z83438 Family history of other disorder of lipoprotein metabolism and other lipidemia: Secondary | ICD-10-CM

## 2022-02-12 DIAGNOSIS — K5090 Crohn's disease, unspecified, without complications: Secondary | ICD-10-CM | POA: Diagnosis present

## 2022-02-12 DIAGNOSIS — I2510 Atherosclerotic heart disease of native coronary artery without angina pectoris: Secondary | ICD-10-CM | POA: Insufficient documentation

## 2022-02-12 DIAGNOSIS — I252 Old myocardial infarction: Secondary | ICD-10-CM

## 2022-02-12 DIAGNOSIS — N182 Chronic kidney disease, stage 2 (mild): Secondary | ICD-10-CM | POA: Diagnosis present

## 2022-02-12 DIAGNOSIS — Z7902 Long term (current) use of antithrombotics/antiplatelets: Secondary | ICD-10-CM | POA: Insufficient documentation

## 2022-02-12 DIAGNOSIS — D6832 Hemorrhagic disorder due to extrinsic circulating anticoagulants: Secondary | ICD-10-CM | POA: Diagnosis present

## 2022-02-12 DIAGNOSIS — N400 Enlarged prostate without lower urinary tract symptoms: Secondary | ICD-10-CM | POA: Diagnosis present

## 2022-02-12 DIAGNOSIS — K200 Eosinophilic esophagitis: Secondary | ICD-10-CM | POA: Diagnosis present

## 2022-02-12 LAB — CBC WITH DIFFERENTIAL/PLATELET
Abs Immature Granulocytes: 0.11 10*3/uL — ABNORMAL HIGH (ref 0.00–0.07)
Basophils Absolute: 0.1 10*3/uL (ref 0.0–0.1)
Basophils Relative: 1 %
Eosinophils Absolute: 0.1 10*3/uL (ref 0.0–0.5)
Eosinophils Relative: 1 %
HCT: 42.7 % (ref 39.0–52.0)
Hemoglobin: 13.2 g/dL (ref 13.0–17.0)
Immature Granulocytes: 1 %
Lymphocytes Relative: 13 %
Lymphs Abs: 1.4 10*3/uL (ref 0.7–4.0)
MCH: 29.7 pg (ref 26.0–34.0)
MCHC: 30.9 g/dL (ref 30.0–36.0)
MCV: 96.2 fL (ref 80.0–100.0)
Monocytes Absolute: 1.3 10*3/uL — ABNORMAL HIGH (ref 0.1–1.0)
Monocytes Relative: 12 %
Neutro Abs: 7.9 10*3/uL — ABNORMAL HIGH (ref 1.7–7.7)
Neutrophils Relative %: 72 %
Platelets: 321 10*3/uL (ref 150–400)
RBC: 4.44 MIL/uL (ref 4.22–5.81)
RDW: 14.8 % (ref 11.5–15.5)
WBC: 10.9 10*3/uL — ABNORMAL HIGH (ref 4.0–10.5)
nRBC: 0 % (ref 0.0–0.2)

## 2022-02-12 LAB — COMPREHENSIVE METABOLIC PANEL
ALT: 54 U/L — ABNORMAL HIGH (ref 0–44)
AST: 32 U/L (ref 15–41)
Albumin: 4.3 g/dL (ref 3.5–5.0)
Alkaline Phosphatase: 32 U/L — ABNORMAL LOW (ref 38–126)
Anion gap: 9 (ref 5–15)
BUN: 25 mg/dL — ABNORMAL HIGH (ref 6–20)
CO2: 32 mmol/L (ref 22–32)
Calcium: 9.3 mg/dL (ref 8.9–10.3)
Chloride: 103 mmol/L (ref 98–111)
Creatinine, Ser: 1.31 mg/dL — ABNORMAL HIGH (ref 0.61–1.24)
GFR, Estimated: >60 mL/min (ref >60)
Glucose, Bld: 123 mg/dL — ABNORMAL HIGH (ref 70–99)
Potassium: 4.1 mmol/L (ref 3.5–5.1)
Sodium: 144 mmol/L (ref 135–145)
Total Bilirubin: 0.9 mg/dL (ref 0.3–1.2)
Total Protein: 6.8 g/dL (ref 6.5–8.1)

## 2022-02-12 LAB — OCCULT BLOOD X 1 CARD TO LAB, STOOL: Fecal Occult Bld: POSITIVE — AB

## 2022-02-12 LAB — PROTIME-INR
INR: 1.1 (ref 0.8–1.2)
Prothrombin Time: 14.3 s (ref 11.4–15.2)

## 2022-02-12 NOTE — Progress Notes (Signed)
Plan of Care Note for accepted transfer   Patient: Jack [name] MRN: [id number]   DOA: 02/12/2022  Facility requesting transfer: [facility] Requesting Provider: Audley Hose, MD   Reason for transfer: Admission for lower GI bleeding Facility course: Jack [name] is a 56 y.o. male with HTN, Crohn's disease, IDA secondary to IBD, HLD, HFrEF, CAD, BPH, eosinophilic esophagitis, Churg-Strauss syndrome with lung involvement, history of STEMI involving LAD 2019, CAD with PCI most recently May 2023 s/p 1 DES to RCA, CKD stage III, prostate cance, who presents with  rectal bleeding that began yesterday.  It is bright red in quality and copious in amount.  It is happening every time he goes to the bathroom and there is no stool in it, has happened approximately 3-4 times since yesterday.  Patient has history of Crohn's disease and this consistent with prior flares.  He has no abdominal pain, fever/chills, shortness of breath, chest pain, urinary symptoms, hematuria, rectal pain.  Patient states he gets an infusion every month for his Crohn's and is on prednisone daily.  Normally gets admitted for his flares.  He does take Plavix.  He denies any lightheadedness or shortness of breath, no syncope.  Patient states he feels well and in his normal state of health other than the bleeding per rectum.  The patient denies any abdominal pain.  He is on prednisone, Entyvio and Imuran for his Crohn's disease.   Last saw gastroenterology on 10/17/2021, on chronic prednisone, Entyvio 3 mg every 4 weeks, and azathioprine 150 mg daily.  Was scheduled for 47-monthfollow-up.  When he came to the ER his BP was 141/90 with otherwise normal vital signs.  Labs revealed a BUN of 25 with a creatinine of 1.31 close to his baseline and ALT 54 with otherwise unremarkable CMP.  CBC showed hemoglobin of 13.2 and hematocrit of 42.7 compared to 14.6 and 45.6 on 10/24.  Plan of care: The patient [facility]  Contact was made with [facility]: Dr. SDustin Flockwho need to be consulted when the patient arrives.  He did not think that this is a Crohn's flare and that the patient will need to be admitted for further evaluation for lower GI bleeding.  The patient will be under the care of responsibility of the ED provider [facility]  Author: JChristel Mormon MD 02/12/2022  Check [email or web address] for on-call coverage.  Nursing staff, Please call [email or web address] Amion as soon as patient's arrival, so appropriate admitting provider can evaluate the pt.

## 2022-02-12 NOTE — ED Triage Notes (Signed)
Pt states he has had bright red rectal bleeding since last night. Pt is currently on Plavix. Pt denies any associated symptoms. PT is AxOx4. Denies any associated symptoms.

## 2022-02-12 NOTE — ED Notes (Signed)
Pt. Resting, no c/o pain or discomfort.

## 2022-02-12 NOTE — ED Provider Triage Note (Signed)
Emergency Medicine Provider Triage Evaluation Note  Jack [name] , a 56 y.o. male  was evaluated in triage.  Pt complains of bright red blood per rectum, on Plavix.  Denies constipation, denies abdominal pain, rectal pain, feeling weak/lightheaded/dizzy  Review of Systems  Positive: As above Negative: As above  Physical Exam  BP (!) 156/88 (BP Location: Left Arm)   Pulse 80   Temp 98.2 F (36.8 C)   Resp 16   SpO2 100%  Gen:   Awake, no distress   Resp:  Normal effort  MSK:   Moves extremities without difficulty  Other:    Medical Decision Making  Medically screening exam initiated at 2:25 PM.  Appropriate orders placed.  Jack [name] was informed that the remainder of the evaluation will be completed by another provider, this initial triage assessment does not replace that evaluation, and the importance of remaining in the ED until their evaluation is complete.     Tacy Learn, PA-C 02/12/22 1426

## 2022-02-12 NOTE — ED Provider Notes (Signed)
[facility] EMERGENCY DEPT Provider Note   CSN: [id number] Arrival date & time: 02/12/22  1349     History {Add pertinent medical, surgical, social history, OB history to HPI:1} Chief Complaint  Patient presents with  . Rectal Bleeding    Jack [name] is a 56 y.o. male with HTN, Crohn's disease, IDA secondary to IBD, HLD, HFrEF, CAD, BPH, eosinophilic esophagitis, Churg-Strauss syndrome with lung involvement, history of STEMI involving LAD 2019, CAD with PCI most recently May 2023 s/p 1 DES to RCA, CKD stage III, prostate cancer presents with ***.   Patient presents with rectal bleeding that began yesterday.  It is bright red in quality and copious in amount.  It is happening every time he goes to the bathroom and there is no stool in it, has happened approximately 3-4 times since yesterday.  Patient has history of Crohn's disease and this consistent with prior flares.  He has no abdominal pain, fever/chills, shortness of breath, chest pain, urinary symptoms, hematuria, rectal pain.  Patient states he gets an infusion every month for his Crohn's and is on prednisone daily.  Normally gets admitted for his flares.  He does take Plavix.  He denies any lightheadedness or shortness of breath, no syncope.  Patient states he feels well and in his normal state of health other than the bleeding per rectum.  Last saw gastroenterology on 10/17/2021, on chronic prednisone, Entyvio 3 mg every 4 weeks, and azathioprine 150 mg daily.  Was scheduled for 4-monthfollow-up.   Rectal Bleeding      Home Medications Prior to Admission medications   Medication Sig Start Date End Date Taking? Authorizing Provider  acetaminophen (TYLENOL) 325 MG tablet Take 2 tablets (650 mg total) by mouth every 4 (four) hours as needed for headache or mild pain. 08/07/21   IIsaiah Serge NP  alendronate (FOSAMAX) 70 MG tablet Take 70 mg by mouth every Saturday.  01/11/18   [provider]   amitriptyline (ELAVIL) 25 MG tablet Take 1 tablet (25 mg total) by mouth at bedtime as needed for sleep. 01/27/22   GRia Bush MD  aspirin 81 MG chewable tablet Chew 1 tablet (81 mg total) by mouth daily. 08/08/21   IIsaiah Serge NP  atorvastatin (LIPITOR) 80 MG tablet TAKE 1 TABLET BY MOUTH DAILY AT 6 PM. 07/01/21   SBelva Crome MD  azaTHIOprine (IMURAN) 50 MG tablet TAKE 3 TABLETS BY MOUTH EVERY DAY 10/21/21   Pyrtle, JLajuan Lines MD  Calcium Carbonate-Vitamin D3 (CALCIUM 600/VITAMIN D) 600-400 MG-UNIT TABS Take 1 tablet by mouth daily. 03/02/19   GRia Bush MD  carvedilol (COREG) 3.125 MG tablet Take 1 tablet (3.125 mg total) by mouth 2 (two) times daily. 08/21/21   WRichardson DoppT, PA-C  clopidogrel (PLAVIX) 75 MG tablet TAKE 1 TABLET BY MOUTH EVERY DAY 07/25/21   SBelva Crome MD  dapagliflozin propanediol (FARXIGA) 10 MG TABS tablet Take 1 tablet (10 mg total) by mouth daily before breakfast. 07/02/21   SBelva Crome MD  diphenoxylate-atropine (LOMOTIL) 2.5-0.025 MG tablet TAKE 1-2 TABLETS BY MOUTH 4 TIMES A DAY AS NEEDED FOR DIARRHEA 09/19/19   Pyrtle, JLajuan Lines MD  ezetimibe (ZETIA) 10 MG tablet TAKE 1 TABLET BY MOUTH EVERY DAY 11/22/21   SBelva Crome MD  finasteride (PROSCAR) 5 MG tablet Take 5 mg by mouth at bedtime. 12/26/20   GRia Bush MD  icosapent Ethyl (VASCEPA) 1 g capsule TAKE 2 CAPSULES BY MOUTH 2 TIMES  DAILY. 06/27/21   Belva Crome, MD  MAGNESIUM PO Take 1-2 tablets by mouth See admin instructions. Alternate taking 1 tablet one night and 2 tablets the next night    [provider]  Multiple Vitamin (MULTIVITAMIN) capsule Take 2 capsules by mouth daily.    [provider]  nitroGLYCERIN (NITROSTAT) 0.4 MG SL tablet Place 1 tablet (0.4 mg total) under the tongue every 5 (five) minutes as needed for chest pain. 08/07/21   Isaiah Serge, NP  pantoprazole (PROTONIX) 40 MG tablet TAKE 1 TABLET BY MOUTH EVERY DAY 01/20/22   [name], MD   predniSONE (DELTASONE) 10 MG tablet Take as directed (Currently taking 21 mg daily- decrease by 1 mg as tolerated) 09/19/19   Pyrtle, Lajuan Lines, MD  predniSONE (DELTASONE) 5 MG tablet Take as directed. (currently on 21 mg daily- decrease by 1 mg as tolerated) 09/19/19   Pyrtle, Lajuan Lines, MD  sacubitril-valsartan (ENTRESTO) 24-26 MG Take 1 tablet by mouth 2 (two) times daily. 07/24/21   Belva Crome, MD  silodosin (RAPAFLO) 8 MG CAPS capsule Take 1 capsule (8 mg total) by mouth daily with breakfast. 12/26/20   [name], MD  spironolactone (ALDACTONE) 25 MG tablet Take 0.5 tablets (12.5 mg total) by mouth every Monday, Wednesday, and Friday. 07/03/21   Belva Crome, MD  tadalafil (CIALIS) 20 MG tablet Take 1 tablet (20 mg total) by mouth daily as needed for erectile dysfunction. 01/27/22   [name], MD  vedolizumab (ENTYVIO) 300 MG injection Inject 300 mg into the vein every 28 (twenty-eight) days.    [provider]      Allergies    Cephalexin and Duloxetine    Review of Systems   Review of Systems  Gastrointestinal:  Positive for hematochezia.   Review of systems positive/negative for ***.  A 10 point review of systems was performed and is negative unless otherwise reported in HPI.  Physical Exam Updated Vital Signs BP (!) 141/90   Pulse 80   Temp 98.2 F (36.8 C)   Resp 16   Ht 5' 7"  (1.702 m)   Wt 79.4 kg   SpO2 100%   BMI 27.41 kg/m  Physical Exam General: Normal appearing ***male/male, lying in bed.  HEENT: PERRLA, Sclera anicteric, MMM, trachea midline. Cardiology: RRR, no murmurs/rubs/gallops. BL radial and DP pulses equal bilaterally.  Resp: Normal respiratory rate and effort. CTAB, no wheezes, rhonchi, crackles.  Abd: Soft, non-tender, non-distended. No rebound tenderness or guarding.  GU: Deferred. MSK: No peripheral edema or signs of trauma. Extremities without deformity or TTP. No cyanosis or clubbing. Skin: warm, dry. No rashes or  lesions. Back: No CVA tenderness Neuro: A&Ox4, CNs II-XII grossly intact. MAEs. Sensation grossly intact.  Psych: Normal mood and affect.   ED Results / Procedures / Treatments   Labs (all labs ordered are listed, but only abnormal results are displayed) Labs Reviewed  COMPREHENSIVE METABOLIC PANEL - Abnormal; Notable for the following components:      Result Value   Glucose, Bld 123 (*)    BUN 25 (*)    Creatinine, Ser 1.31 (*)    ALT 54 (*)    Alkaline Phosphatase 32 (*)    All other components within normal limits  CBC WITH DIFFERENTIAL/PLATELET - Abnormal; Notable for the following components:   WBC 10.9 (*)    Neutro Abs 7.9 (*)    Monocytes Absolute 1.3 (*)    Abs Immature Granulocytes 0.11 (*)  All other components within normal limits  OCCULT BLOOD X 1 CARD TO LAB, STOOL - Abnormal; Notable for the following components:   Fecal Occult Bld POSITIVE (*)    All other components within normal limits  PROTIME-INR    EKG None  Radiology No results found.  Procedures Procedures  {Document cardiac monitor, telemetry assessment procedure when appropriate:1}  Medications Ordered in ED Medications - No data to display  ED Course/ Medical Decision Making/ A&P                          Medical Decision Making Amount and/or Complexity of Data Reviewed Labs: ordered.    @HNNMDM @ *** Patient is overall well-appearing and states he is having a Crohn's flare at this time.  Patient is with no abdominal pain, only symptom is bright red blood per rectum.  Digital rectal exam demonstrates dark red blood in the rectal vault.  Labs significant for hemoglobin 13.2 down from 14.6 on 01/21/2022.  Previous baseline appears to be 13-14.  Creatinine 1.3, consistent with prior values.  Patient be consulted to gastroenterology as he states he normally gets admitted for his flares and will seek their advice in managing his current flare.   I have personally reviewed and interpreted  all labs and imaging.   Clinical Course as of 02/12/22 1731  Wed Feb 12, 2022  1650 Consult to [facility] gastroenterology [HN]    Clinical Course User Index [HN] Audley Hose, MD    {Document critical care time when appropriate:1} {Document review of labs and clinical decision tools ie heart score, Chads2Vasc2 etc:1}  {Document your independent review of radiology images, and any outside records:1} {Document your discussion with family members, caretakers, and with consultants:1} {Document social determinants of health affecting pt's care:1} {Document your decision making why or why not admission, treatments were needed:1} Final Clinical Impression(s) / ED Diagnoses Final diagnoses:  None    Rx / DC Orders ED Discharge Orders     None        This note was created using dictation software, which may contain spelling or grammatical errors.

## 2022-02-13 DIAGNOSIS — Z83438 Family history of other disorder of lipoprotein metabolism and other lipidemia: Secondary | ICD-10-CM | POA: Diagnosis not present

## 2022-02-13 DIAGNOSIS — M301 Polyarteritis with lung involvement [Churg-Strauss]: Secondary | ICD-10-CM | POA: Diagnosis present

## 2022-02-13 DIAGNOSIS — K921 Melena: Secondary | ICD-10-CM

## 2022-02-13 DIAGNOSIS — K5080 Crohn's disease of both small and large intestine without complications: Secondary | ICD-10-CM

## 2022-02-13 DIAGNOSIS — D6832 Hemorrhagic disorder due to extrinsic circulating anticoagulants: Secondary | ICD-10-CM | POA: Diagnosis present

## 2022-02-13 DIAGNOSIS — Z8249 Family history of ischemic heart disease and other diseases of the circulatory system: Secondary | ICD-10-CM | POA: Diagnosis not present

## 2022-02-13 DIAGNOSIS — Z8546 Personal history of malignant neoplasm of prostate: Secondary | ICD-10-CM | POA: Diagnosis not present

## 2022-02-13 DIAGNOSIS — K219 Gastro-esophageal reflux disease without esophagitis: Secondary | ICD-10-CM | POA: Diagnosis present

## 2022-02-13 DIAGNOSIS — K2970 Gastritis, unspecified, without bleeding: Secondary | ICD-10-CM | POA: Diagnosis present

## 2022-02-13 DIAGNOSIS — D62 Acute posthemorrhagic anemia: Secondary | ICD-10-CM | POA: Diagnosis not present

## 2022-02-13 DIAGNOSIS — I252 Old myocardial infarction: Secondary | ICD-10-CM | POA: Diagnosis not present

## 2022-02-13 DIAGNOSIS — E785 Hyperlipidemia, unspecified: Secondary | ICD-10-CM | POA: Diagnosis present

## 2022-02-13 DIAGNOSIS — I130 Hypertensive heart and chronic kidney disease with heart failure and stage 1 through stage 4 chronic kidney disease, or unspecified chronic kidney disease: Secondary | ICD-10-CM | POA: Diagnosis present

## 2022-02-13 DIAGNOSIS — D509 Iron deficiency anemia, unspecified: Secondary | ICD-10-CM | POA: Diagnosis present

## 2022-02-13 DIAGNOSIS — D126 Benign neoplasm of colon, unspecified: Secondary | ICD-10-CM | POA: Diagnosis present

## 2022-02-13 DIAGNOSIS — Z955 Presence of coronary angioplasty implant and graft: Secondary | ICD-10-CM | POA: Diagnosis not present

## 2022-02-13 DIAGNOSIS — Z800 Family history of malignant neoplasm of digestive organs: Secondary | ICD-10-CM | POA: Diagnosis not present

## 2022-02-13 DIAGNOSIS — Z888 Allergy status to other drugs, medicaments and biological substances status: Secondary | ICD-10-CM | POA: Diagnosis not present

## 2022-02-13 DIAGNOSIS — N182 Chronic kidney disease, stage 2 (mild): Secondary | ICD-10-CM | POA: Diagnosis present

## 2022-02-13 DIAGNOSIS — K625 Hemorrhage of anus and rectum: Secondary | ICD-10-CM | POA: Diagnosis not present

## 2022-02-13 DIAGNOSIS — K4020 Bilateral inguinal hernia, without obstruction or gangrene, not specified as recurrent: Secondary | ICD-10-CM | POA: Diagnosis present

## 2022-02-13 DIAGNOSIS — N400 Enlarged prostate without lower urinary tract symptoms: Secondary | ICD-10-CM | POA: Diagnosis present

## 2022-02-13 DIAGNOSIS — Z9682 Presence of neurostimulator: Secondary | ICD-10-CM | POA: Diagnosis not present

## 2022-02-13 DIAGNOSIS — I5022 Chronic systolic (congestive) heart failure: Secondary | ICD-10-CM | POA: Diagnosis present

## 2022-02-13 DIAGNOSIS — I2510 Atherosclerotic heart disease of native coronary artery without angina pectoris: Secondary | ICD-10-CM | POA: Diagnosis present

## 2022-02-13 LAB — HEMOGLOBIN AND HEMATOCRIT, BLOOD
HCT: 37.3 % — ABNORMAL LOW (ref 39.0–52.0)
HCT: 43.5 % (ref 39.0–52.0)
HCT: 40.6 % (ref 39.0–52.0)
Hemoglobin: 12.1 g/dL — ABNORMAL LOW (ref 13.0–17.0)
Hemoglobin: 13.6 g/dL (ref 13.0–17.0)
Hemoglobin: 13.3 g/dL (ref 13.0–17.0)

## 2022-02-13 LAB — GLUCOSE, CAPILLARY
Glucose-Capillary: 79 mg/dL (ref 70–99)
Glucose-Capillary: 82 mg/dL (ref 70–99)
Glucose-Capillary: 170 mg/dL — ABNORMAL HIGH (ref 70–99)
Glucose-Capillary: 131 mg/dL — ABNORMAL HIGH (ref 70–99)

## 2022-02-13 MED ORDER — PANTOPRAZOLE SODIUM 40 MG PO TBEC
40.0000 mg | DELAYED_RELEASE_TABLET | Freq: Every day | ORAL | Status: DC
  Administered 2022-02-13 – 2022-02-14 (×2): 40 mg via ORAL
  Filled 2022-02-13 (×2): qty 1

## 2022-02-13 MED ORDER — DAPAGLIFLOZIN PROPANEDIOL 10 MG PO TABS
10.0000 mg | ORAL_TABLET | Freq: Every day | ORAL | Status: DC
  Administered 2022-02-13 – 2022-02-14 (×2): 10 mg via ORAL
  Filled 2022-02-13 (×3): qty 1

## 2022-02-13 MED ORDER — PREDNISONE 20 MG PO TABS
20.0000 mg | ORAL_TABLET | Freq: Every day | ORAL | Status: DC
  Administered 2022-02-13 – 2022-02-14 (×2): 20 mg via ORAL
  Filled 2022-02-13 (×2): qty 1

## 2022-02-13 MED ORDER — INSULIN ASPART 100 UNIT/ML IJ SOLN: 0.0000 [IU] | Freq: Three times a day (TID) | INTRAMUSCULAR | Status: DC

## 2022-02-13 MED ORDER — PREDNISONE 20 MG PO TABS: 20.0000 mg | ORAL_TABLET | Freq: Every day | ORAL | Status: DC

## 2022-02-13 MED ORDER — ICOSAPENT ETHYL 1 G PO CAPS
2.0000 g | ORAL_CAPSULE | Freq: Two times a day (BID) | ORAL | Status: DC
  Administered 2022-02-13 – 2022-02-14 (×3): 2 g via ORAL
  Filled 2022-02-13 (×4): qty 2

## 2022-02-13 MED ORDER — AMITRIPTYLINE HCL 50 MG PO TABS: 25.0000 mg | ORAL_TABLET | Freq: Every evening | ORAL | Status: DC | PRN

## 2022-02-13 MED ORDER — AZATHIOPRINE 50 MG PO TABS
150.0000 mg | ORAL_TABLET | Freq: Every day | ORAL | Status: DC
  Filled 2022-02-13 (×2): qty 3

## 2022-02-13 MED ORDER — AZATHIOPRINE 50 MG PO TABS
150.0000 mg | ORAL_TABLET | Freq: Every day | ORAL | Status: DC
  Administered 2022-02-13: 150 mg via ORAL
  Filled 2022-02-13 (×2): qty 3

## 2022-02-13 MED ORDER — SODIUM CHLORIDE 0.9 % IV SOLN: INTRAVENOUS | Status: DC

## 2022-02-13 MED ORDER — ACETAMINOPHEN 325 MG PO TABS: 650.0000 mg | ORAL_TABLET | ORAL | Status: DC | PRN

## 2022-02-13 MED ORDER — LACTATED RINGERS IV BOLUS
500.0000 mL | Freq: Once | INTRAVENOUS | Status: AC
  Administered 2022-02-13: 500 mL via INTRAVENOUS

## 2022-02-13 MED ORDER — SACUBITRIL-VALSARTAN 24-26 MG PO TABS
1.0000 | ORAL_TABLET | Freq: Two times a day (BID) | ORAL | Status: DC
  Administered 2022-02-13 – 2022-02-14 (×3): 1 via ORAL
  Filled 2022-02-13 (×5): qty 1

## 2022-02-13 MED ORDER — ATORVASTATIN CALCIUM 80 MG PO TABS
80.0000 mg | ORAL_TABLET | Freq: Every day | ORAL | Status: DC
  Administered 2022-02-13 – 2022-02-14 (×2): 80 mg via ORAL
  Filled 2022-02-13 (×2): qty 1

## 2022-02-13 MED ORDER — FINASTERIDE 5 MG PO TABS
5.0000 mg | ORAL_TABLET | Freq: Every day | ORAL | Status: DC
  Administered 2022-02-13: 5 mg via ORAL
  Filled 2022-02-13: qty 1

## 2022-02-13 MED ORDER — DIPHENOXYLATE-ATROPINE 2.5-0.025 MG PO TABS: 1.0000 | ORAL_TABLET | Freq: Four times a day (QID) | ORAL | Status: DC | PRN

## 2022-02-13 MED ORDER — TAMSULOSIN HCL 0.4 MG PO CAPS
0.8000 mg | ORAL_CAPSULE | Freq: Every day | ORAL | Status: DC
  Filled 2022-02-13: qty 2

## 2022-02-13 MED ORDER — SILODOSIN 8 MG PO CAPS
8.0000 mg | ORAL_CAPSULE | Freq: Every day | ORAL | Status: DC
  Administered 2022-02-13 – 2022-02-14 (×2): 8 mg via ORAL
  Filled 2022-02-13 (×2): qty 1

## 2022-02-13 MED ORDER — PREDNISONE 10 MG PO TABS: 10.0000 mg | ORAL_TABLET | Freq: Every day | ORAL | Status: DC

## 2022-02-13 MED ORDER — SPIRONOLACTONE 12.5 MG HALF TABLET
12.5000 mg | ORAL_TABLET | ORAL | Status: DC
  Administered 2022-02-14: 12.5 mg via ORAL
  Filled 2022-02-13: qty 1

## 2022-02-13 MED ORDER — NITROGLYCERIN 0.4 MG SL SUBL: 0.4000 mg | SUBLINGUAL_TABLET | SUBLINGUAL | Status: DC | PRN

## 2022-02-13 MED ORDER — CARVEDILOL 3.125 MG PO TABS
3.1250 mg | ORAL_TABLET | Freq: Two times a day (BID) | ORAL | Status: DC
  Administered 2022-02-13 – 2022-02-14 (×3): 3.125 mg via ORAL
  Filled 2022-02-13 (×3): qty 1

## 2022-02-13 MED ORDER — EZETIMIBE 10 MG PO TABS
10.0000 mg | ORAL_TABLET | Freq: Every day | ORAL | Status: DC
  Administered 2022-02-13: 10 mg via ORAL
  Filled 2022-02-13: qty 1

## 2022-02-13 MED ORDER — EZETIMIBE 10 MG PO TABS
10.0000 mg | ORAL_TABLET | Freq: Every day | ORAL | Status: DC
  Filled 2022-02-13: qty 1

## 2022-02-13 MED ORDER — ICOSAPENT ETHYL 1 G PO CAPS
1.0000 g | ORAL_CAPSULE | Freq: Two times a day (BID) | ORAL | Status: DC
  Filled 2022-02-13 (×2): qty 1

## 2022-02-13 NOTE — Consult Note (Signed)
Consultation  Referring Provider:  TR[facility]/ Norins Primary Care Physician:  [name], MD Primary Gastroenterologist:  Dr.Pyrtle  Reason for Consultation:  GI bleed  HPI: Jack [name] is a 56 y.o. male, well-known to Dr. [name] with longstanding history of Crohn's ileocolitis and prior history of C. difficile.  He also has history of inflammatory and adenomatous colon polyps. He was admitted yesterday through the emergency room after he had onset on Tuesday evening with bright red blood per rectum was described as copious amounts.  He had 3-4 episodes yesterday he had been out of town for work and drove back to [place] and presented to the emergency room. He has been on Plavix and baby aspirin, has history of coronary artery disease and is status post MI and PCI/stent May 2023. 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 Patient is currently managed with Entyvio 300 mg every 4 weeks and azathioprine 150 mg daily, currently on 40 mg of prednisone daily for lung disease  Last colonoscopy was done in September 2020, 8 polyps were removed and noted mild inflammation in the left colon.  Biopsies showed 7 of the polyps to be inflammatory polyps and one was a tubular adenoma, biopsies also showed mild activity of Crohn's in the right and left colon. He also had EGD at that same time showing gastritis, biopsies negative for H. pylori evidence for Crohn's in the stomach.  Patient says he has been feeling fine recently he has not had any symptoms suggestive of active Crohn's, says his bowel movements have been normal he has not been noticing any smaller intermittent episodes of bleeding, denies any diarrhea issues, no abdominal pain or cramping no nausea or vomiting.  He has not had any adjustments to medications  or new medications.  He last took Plavix yesterday Labs on 01/21/2022 hemoglobin 14.6 Arrival yesterday WBC 10.9 hemoglobin 13.2 INR 1.1,  today hemoglobin 12.1/hematocrit 37.3  He has not had any further bowel movements or bleeding since admission, no bleeding overnight or this morning thus far and says he feels fine     Past Medical History:  Diagnosis Date   Acquired renal cyst of right kidney 08/31/2018   2.8cm R upper pole rec monitor with yearly imaging (09/2018)   Allergy    BPH (benign prostatic hypertrophy)    CAD (coronary artery disease)    a. anterior STEMI 01/2018 -  proximal occlusion of LAD, treated with DES. Cath also showed 20% distal LM, 95% ostial-prox small-moderate ramus, 70-80% ostial Cx, 70% dominant ostial OM, 50-60% prox Cx, 70% RCA. EF 35% by cath with LVEDP 26mHg. Med rx for residual disease. Course complicated by post MI pericarditis and LV thrombus.   Chronic systolic (congestive) heart failure (HCC)    Chronic systolic HF (heart failure) ([facility] 08/07/2021   Colitis    Colon polyp    inflammatory   Crohn disease ([facility] 1992   history uveitis, involvement of intestines and lungs   Diverticulosis    Eosinophilic granuloma ([facility]    Essential hypertension    GERD (gastroesophageal reflux disease)    GI bleeding    History of chicken pox    History of gastroesophageal reflux (GERD)    History of seizure 1995   grand mal x1, completed 6 yrs dilantin. no seizures since   Hyperlipidemia    Internal hemorrhoids  Ischemic cardiomyopathy    LV (left ventricular) mural thrombus following MI ([place]) 01/2018   Myocardial infarction ([place])    Osteoporosis 11/2015   DEXA T -2.9   Schwannoma 2007   L axilla s/p surgery   Seizures ([place])    last seizure 1995 and only x 1 seizure-    Ulnar neuropathy    h/o this from L arm schwannoma    Past Surgical History:  Procedure Laterality Date   APPENDECTOMY  2000   BACK SURGERY  2011   lumbar   CARDIAC  CATHETERIZATION     CARDIOVASCULAR STRESS TEST  01/2015   low risk study   CHOLECYSTECTOMY  2000   COLONOSCOPY  08/2014   f/u crohn's, 4 inflammatory polyps, diverticulosis, improved, rpt 2 yrs ([place])   COLONOSCOPY  11/2018   multiple polyps, 1 TA, rpt 2 yrs (Pyrt[place])   COLONOSCOPY WITH PROPOFOL N/A 04/09/2018   inflammatory polyp, f/u left to primary GI - [place] [name], Kirke Corin, MD)   CORONARY ANGIOGRAPHY N/A 02/21/2018   Procedure: CORONARY ANGIOGRAPHY;  Surgeon: Belva Crome, MD;  Location: [place] [facility];  Service: Cardiovascular;  Laterality: N/A;   CORONARY STENT INTERVENTION N/A 08/06/2021   Procedure: CORONARY STENT INTERVENTION;  Surgeon: Belva Crome, MD;  Location: [place] [facility];  Service: Cardiovascular;  Laterality: N/A;   CORONARY/GRAFT ACUTE MI REVASCULARIZATION N/A 02/20/2018   Procedure: Coronary/Graft Acute MI Revascularization;  Surgeon: Belva Crome, MD;  Location: [place] [facility];  Service: Cardiovascular;  Laterality: N/A;   ESOPHAGOGASTRODUODENOSCOPY  07/2014   h/o EE resolved, focal reflux esophagitis, chronic active gastritis   ESOPHAGOGASTRODUODENOSCOPY  11/2018   gastritis, no active crohn's ([place])   extremity surgery Left    ulnar nerve repair after schwannoma removal   FLEXIBLE SIGMOIDOSCOPY N/A 04/14/2018   Procedure: FLEXIBLE SIGMOIDOSCOPY;  Surgeon: Milus Banister, MD;  [facility] ENDOSCOPY;  Service: Endoscopy;  Laterality: N/A;   INGUINAL HERNIA REPAIR Bilateral 2017   LAMINECTOMY N/A 08/09/2018   Procedure: Lumbar three Laminectomy, excision of intradural tumor;  Surgeon: Kristeen Miss, MD;  Location: [place];  Service: Neurosurgery;  Laterality: N/A;   LEFT HEART CATH AND CORONARY ANGIOGRAPHY N/A 02/20/2018   Procedure: LEFT HEART CATH AND CORONARY ANGIOGRAPHY;  Surgeon: Belva Crome, MD;  Location: [place] [facility];  Service: Cardiovascular;  Laterality: N/A;   LEFT HEART CATH AND CORONARY ANGIOGRAPHY N/A 08/06/2021    Procedure: LEFT HEART CATH AND CORONARY ANGIOGRAPHY;  Surgeon: Belva Crome, MD;  Location: [place] [facility];  Service: Cardiovascular;  Laterality: N/A;   LUMBAR SPINE SURGERY  2011   POLYPECTOMY  04/09/2018   Procedure: POLYPECTOMY;  Surgeon: Doran Stabler, MD;  Location[facility] ENDOSCOPY;  Service: Gastroenterology;;   SPINAL CORD STIMULATOR IMPLANT  2012   x2 (Dr Berton Mount)   [place]  04/09/2018   Procedure: SUBMUCOSAL INJECTION;  Surgeon: Doran Stabler, MD;  Location: [facility] ENDOSCOPY;  Service: Gastroenterology;;   TONSILLECTOMY  1996   TUMOR REMOVAL Left 2007   schwannoma from L armpit   UPPER GASTROINTESTINAL ENDOSCOPY      Prior to Admission medications   Medication Sig Start Date End Date Taking? Authorizing Provider  acetaminophen (TYLENOL) 325 MG tablet Take 2 tablets (650 mg total) by mouth every 4 (four) hours as needed for headache or mild pain. 08/07/21   Isaiah Serge, NP  alendronate (FOSAMAX) 70 MG tablet Take 70 mg by mouth every  Saturday.  01/11/18   [provider]  amitriptyline (ELAVIL) 25 MG tablet Take 1 tablet (25 mg total) by mouth at bedtime as needed for sleep. 01/27/22   [name], MD  aspirin 81 MG chewable tablet Chew 1 tablet (81 mg total) by mouth daily. 08/08/21   Isaiah Serge, NP  atorvastatin (LIPITOR) 80 MG tablet TAKE 1 TABLET BY MOUTH DAILY AT 6 PM. Patient taking differently: Take 80 mg by mouth daily. 07/01/21   Belva Crome, MD  azaTHIOprine (IMURAN) 50 MG tablet TAKE 3 TABLETS BY MOUTH EVERY DAY Patient taking differently: Take 150 mg by mouth daily. 10/21/21   Pyrtle, Lajuan Lines, MD  Calcium Carbonate-Vitamin D3 (CALCIUM 600/VITAMIN D) 600-400 MG-UNIT TABS Take 1 tablet by mouth daily. 03/02/19   [name], MD  carvedilol (COREG) 3.125 MG tablet Take 1 tablet (3.125 mg total) by mouth 2 (two) times daily. 08/21/21   Richardson Dopp T, PA-C  clopidogrel (PLAVIX) 75 MG tablet TAKE 1 TABLET BY MOUTH EVERY  DAY Patient taking differently: Take 75 mg by mouth daily. 07/25/21   Belva Crome, MD  dapagliflozin propanediol (FARXIGA) 10 MG TABS tablet Take 1 tablet (10 mg total) by mouth daily before breakfast. 07/02/21   Belva Crome, MD  ezetimibe (ZETIA) 10 MG tablet TAKE 1 TABLET BY MOUTH EVERY DAY Patient taking differently: Take 10 mg by mouth daily. 11/22/21   Belva Crome, MD  finasteride (PROSCAR) 5 MG tablet Take 5 mg by mouth at bedtime. 12/26/20   [name], MD  icosapent Ethyl (VASCEPA) 1 g capsule TAKE 2 CAPSULES BY MOUTH 2 TIMES DAILY. Patient taking differently: Take 2 g by mouth 2 (two) times daily. 06/27/21   Belva Crome, MD  MAGNESIUM PO Take 1-2 tablets by mouth See admin instructions. Alternate taking 1 tablet one night and 2 tablets the next night    [provider]  Multiple Vitamin (MULTIVITAMIN) capsule Take 2 capsules by mouth daily.    [provider]  nitroGLYCERIN (NITROSTAT) 0.4 MG SL tablet Place 1 tablet (0.4 mg total) under the tongue every 5 (five) minutes as needed for chest pain. 08/07/21   Isaiah Serge, NP  pantoprazole (PROTONIX) 40 MG tablet TAKE 1 TABLET BY MOUTH EVERY DAY Patient taking differently: Take 40 mg by mouth daily. 01/20/22   [name], MD  predniSONE (DELTASONE) 10 MG tablet Take as directed (Currently taking 21 mg daily- decrease by 1 mg as tolerated) 09/19/19   Pyrtle, Lajuan Lines, MD  predniSONE (DELTASONE) 5 MG tablet Take as directed. (currently on 21 mg daily- decrease by 1 mg as tolerated) 09/19/19   Pyrtle, Lajuan Lines, MD  sacubitril-valsartan (ENTRESTO) 24-26 MG Take 1 tablet by mouth 2 (two) times daily. 07/24/21   Belva Crome, MD  silodosin (RAPAFLO) 8 MG CAPS capsule Take 1 capsule (8 mg total) by mouth daily with breakfast. 12/26/20   [name], MD  spironolactone (ALDACTONE) 25 MG tablet Take 0.5 tablets (12.5 mg total) by mouth every Monday, Wednesday, and Friday. 07/03/21   Belva Crome, MD  tadalafil  (CIALIS) 20 MG tablet Take 1 tablet (20 mg total) by mouth daily as needed for erectile dysfunction. 01/27/22   [name], MD  vedolizumab (ENTYVIO) 300 MG injection Inject 300 mg into the vein every 28 (twenty-eight) days.    [provider]    Current Facility-Administered Medications  Medication Dose Route Frequency Provider Last Rate Last Admin   0.9 %  sodium chloride infusion   Intravenous Continuous Norins, Heinz Knuckles, MD       acetaminophen (TYLENOL) tablet 650 mg  650 mg Oral Q4H PRN Norins, Heinz Knuckles, MD       amitriptyline (ELAVIL) tablet 25 mg  25 mg Oral QHS PRN Norins, Heinz Knuckles, MD       atorvastatin (LIPITOR) tablet 80 mg  80 mg Oral Daily Norins, Heinz Knuckles, MD       azaTHIOprine [facility]) tablet 150 mg  150 mg Oral Daily Norins, Heinz Knuckles, MD       carvedilol (COREG) tablet 3.125 mg  3.125 mg Oral BID WC Norins, Heinz Knuckles, MD       dapagliflozin propanediol (FARXIGA) tablet 10 mg  10 mg Oral QAC breakfast Norins, Heinz Knuckles, MD       diphenoxylate-atropine (LOMOTIL) 2.5-0.025 MG per tablet 1 tablet  1 tablet Oral QID PRN Norins, Heinz Knuckles, MD       ezetimibe (ZETIA) tablet 10 mg  10 mg Oral Daily Norins, Heinz Knuckles, MD       finasteride (PROSCAR) tablet 5 mg  5 mg Oral QHS Norins, Heinz Knuckles, MD       icosapent Ethyl (VASCEPA) 1 g capsule 1 g  1 g Oral BID Norins, Heinz Knuckles, MD       insulin aspart (novoLOG) injection 0-20 Units  0-20 Units Subcutaneous TID WC Norins, Heinz Knuckles, MD       nitroGLYCERIN (NITROSTAT) SL tablet 0.4 mg  0.4 mg Sublingual Q5 min PRN Norins, Heinz Knuckles, MD       pantoprazole (PROTONIX) EC tablet 40 mg  40 mg Oral Daily Norins, Heinz Knuckles, MD       [START ON 02/14/2022] predniSONE (DELTASONE) tablet 20 mg  20 mg Oral Q breakfast Norins, Heinz Knuckles, MD       sacubitril-valsartan (ENTRESTO) 24-26 mg per tablet  1 tablet Oral BID Neena Rhymes, MD       [START ON 02/14/2022] spironolactone (ALDACTONE) tablet 12.5 mg  12.5 mg Oral Q M,W,F  Norins, Heinz Knuckles, MD       tamsulosin (FLOMAX) capsule 0.8 mg  0.8 mg Oral Daily Norins, Heinz Knuckles, MD       Facility-Administered Medications Ordered in Other Encounters  Medication Dose Route Frequency Provider Last Rate Last Admin   ondansetron (ZOFRAN) 4 mg in sodium chloride 0.9 % 50 mL IVPB  4 mg Intravenous Q6H PRN Kristeen Miss, MD        Allergies as of 02/12/2022 - Review Complete 02/12/2022  Allergen Reaction Noted   Cephalexin Nausea And Vomiting 01/28/2014   Duloxetine Other (See Comments) 01/28/2014    Family History  Problem Relation Age of Onset   CAD Mother 4       CABG   Congenital heart disease Mother    Hypertension Mother    Hyperlipidemia Mother    CAD Father 55       CABG   Hypertension Father    Hyperlipidemia Father    Colon cancer Maternal Grandmother    Crohn's disease Brother    Diabetes Neg Hx    Colon polyps Neg Hx    Esophageal cancer Neg Hx    Rectal cancer Neg Hx    Stomach cancer Neg Hx     Social History   Socioeconomic History   Marital status: Married    Spouse name: Mickel Baas   Number of children: 2   Years of education: 28  Highest education level: Bachelor's degree (e.g., BA, AB, BS)  Occupational History   Occupation: Self-employed    Comment: Patient owns 2 mattress stores  Tobacco Use   Smoking status: Some Days    Types: Cigars    Passive exposure: Never   Smokeless tobacco: Never  Vaping Use   Vaping Use: Never used  Substance and Sexual Activity   Alcohol use: Yes    Comment: Occasional   Drug use: No   Sexual activity: Not on file  Other Topics Concern   Not on file  Social History Narrative   Lives with wife, 1 dog. Grown children   Edu: college   Occ: owns mattress store   Activity: active at work, started [employer], wants to restart running   Diet: good water, fruits/vegetables daily   Social Determinants of Health   Financial Resource Strain: Low Risk  (05/11/2018)   Overall Financial Resource Strain  (CARDIA)    Difficulty of Paying Living Expenses: Not hard at all  Food Insecurity: No Food Insecurity (05/11/2018)   Hunger Vital Sign    Worried About Running Out of Food in the Last Year: Never true    [place] in the Last Year: Never true  Transportation Needs: No Transportation Needs (05/11/2018)   PRAPARE - Hydrologist (Medical): No    Lack of Transportation (Non-Medical): No  Physical Activity: Inactive (05/11/2018)   Exercise Vital Sign    Days of Exercise per Week: 0 days    Minutes of Exercise per Session: 0 min  Stress: No Stress Concern Present (05/11/2018)   [place]    Feeling of Stress : Only a little  Social Connections: Not on file  Intimate Partner Violence: Not on file    Review of Systems: Pertinent positive and negative review of systems were noted in the above HPI section.  All other review of systems was otherwise negative.   Physical Exam: Vital signs in last 24 hours: Temp:  [97.7 F (36.5 C)-98.2 F (36.8 C)] 97.9 F (36.6 C) (11/16 0857) Pulse Rate:  [55-80] 56 (11/16 0857) Resp:  [15-18] 18 (11/16 0857) BP: (99-156)/(56-103) 135/82 (11/16 0857) SpO2:  [97 %-100 %] 98 % (11/16 0857) Weight:  [79.4 kg] 79.4 kg (11/15 1425) Last BM Date : 02/13/22 General:   Alert,  Well-developed, well-nourished, older white male pleasant and cooperative in NAD Head:  Normocephalic and atraumatic. Eyes:  Sclera clear, no icterus.   Conjunctiva pink. Ears:  Normal auditory acuity. Nose:  No deformity, discharge,  or lesions. Mouth:  No deformity or lesions.   Neck:  Supple; no masses or thyromegaly. Lungs:  Clear throughout to auscultation.   No wheezes, crackles, or rhonchi. Heart:  Regular rate and rhythm; no murmurs, clicks, rubs,  or gallops. Abdomen:  Soft,nontender, BS active,nonpalp mass or hsm.   Rectal: Done today-per ER MD with small nonbleeding external  hemorrhoid gross dark red blood on glove with digital exam Msk:  Symmetrical without gross deformities. . Pulses:  Normal pulses noted. Extremities:  Without clubbing or edema. Neurologic:  Alert and  oriented x4;  grossly normal neurologically. Skin:  Intact without significant lesions or rashes.. Psych:  Alert and cooperative. Normal mood and affect.  Intake/Output from previous day: No intake/output data recorded. Intake/Output this shift: No intake/output data recorded.  Lab Results: Recent Labs    02/12/22 1559 02/13/22 0755  WBC 10.9*  --  HGB 13.2 12.1*  HCT 42.7 37.3*  PLT 321  --    BMET Recent Labs    02/12/22 1559  NA 144  K 4.1  CL 103  CO2 32  GLUCOSE 123*  BUN 25*  CREATININE 1.31*  CALCIUM 9.3   LFT Recent Labs    02/12/22 1559  PROT 6.8  ALBUMIN 4.3  AST 32  ALT 54*  ALKPHOS 32*  BILITOT 0.9   PT/INR Recent Labs    02/12/22 1559  LABPROT 14.3  INR 1.1   Hepatitis Panel No results for input(s): "HEPBSAG", "HCVAB", "HEPAIGM", "HEPBIGM" in the last 72 hours.   IMPRESSION:  #47 56 year old white male with longstanding history of Crohn's ileocolitis currently on Entyvio 300 mg every 4 weeks and azathioprine 150 mg daily who has been asymptomatic for many months, and now presents with acute onset of bright red blood per rectum on 02/11/2022.  He had 3-4 episodes yesterday all dark red blood.  Fortunately he has not had any further bleeding overnight or this morning. Bleeding in setting of Plavix and aspirin  He has history of colonic pseudopolyps/inflammatory polyps with last colonoscopy September 2020 at which time he also had mildly active disease in the right and left colon. He also had an acute GI bleed in January 2020 secondary to inflammatory pseudopolyp Bleeding may be secondary to an ulcerated inflammatory polyp Consider diverticular source   He has been hemodynamically stable, 2 g drop in hemoglobin from his baseline  #2  coronary artery disease status post MI and PCI/stent May 2023 on Plavix and aspirin Also had an MI in November 2019 proximal occlusion of LAD treated with DES  #3 congestive heart failure-35 to 40% #4 GERD and history of EOE #5.  History of prostate cancer #6 Chaug Strauss syndrome requiring chronic steroids.  PLAN: Full liquid diet today Hold Plavix Serial hemoglobins every 6 hours, transfuse for hemoglobin 7.5 or less Will observe today, if he has evidence of further bleeding today then may require bowel prep and colonoscopy for possible clipping of any pseudopolyps.  GI will follow with you  Davy Faught PA-C 02/13/2022, 9:04 AM

## 2022-02-13 NOTE — ED Notes (Signed)
Report given to [facility] for transfer

## 2022-02-13 NOTE — Assessment & Plan Note (Signed)
ED lab reveals a very minor increase in Creatinine from baseline.  Plan Gentle hydration  Avoid nephrotoxic meds as possible  F/u Bmet in AM

## 2022-02-13 NOTE — Progress Notes (Signed)
Pt on cardiac monitoring, verified with second RN and CCMD

## 2022-02-13 NOTE — Assessment & Plan Note (Signed)
BP stable  Plan Continue home meds

## 2022-02-13 NOTE — ED Notes (Signed)
Report given to Adrian,RN at [facility]

## 2022-02-13 NOTE — Assessment & Plan Note (Signed)
Patient presents with several episodes of painless rectal bleeding: BRBPR w/o clots, w/o pain. In ED Hgb stable at 14g and BP stable. This is not like previous flares of Crohn's. Suspect diverticular bleed.  Plan Tele obs  H/H q 12  Hold ASA and Plavix - taken 2/2 cardiac stent  GI consult: watchful waiting vs colonoscopy

## 2022-02-13 NOTE — Assessment & Plan Note (Signed)
Last lipid panel 2 months ago - good control noted.  Plan Continue home regimen

## 2022-02-13 NOTE — Assessment & Plan Note (Signed)
Patient reports he hasn't had a flare for a long time. His current symptoms are not similar to previous flares. He is currently on a complex regimen including prednisone 20 mg daily.  Plan Continue home regimen

## 2022-02-13 NOTE — Plan of Care (Signed)
0700: Pt resting in bed at beginning of shift pt alert and oriented x4, pt on room air, pt independent and ambulatory, pt on telemetry, pt call bell within reach and bed in lowest position.

## 2022-02-13 NOTE — Assessment & Plan Note (Signed)
Patient is well compensated. He denies any change in respiratory function, denies peripheral edema. On exam lungs are clear.  Plan Continue home regimen

## 2022-02-13 NOTE — H&P (Signed)
History and Physical    Jack [name] WLS:[id number] DOB: [birth year] DOA: 02/12/2022  DOS: the patient was seen and examined on 02/12/2022  PCP: [name], MD   Patient coming from:  transfer from [facility] - was at home prior to ED  I have personally briefly reviewed patient's old medical records in [place] is a 56 y.o. male with HTN, Crohn's disease, IDA secondary to IBD, HLD, HFrEF, CAD, BPH, eosinophilic esophagitis, Churg-Strauss syndrome with lung involvement, history of STEMI involving LAD 2019, CAD with PCI most recently May 2023 s/p 1 DES to RCA, CKD stage III, prostate cance, who presents with  rectal bleeding that began yesterday.  It is bright red in quality and copious in amount.  It is happening every time he goes to the bathroom and there is no stool in it, has happened approximately 3-4 times since yesterday.  Patient has history of Crohn's disease and this consistent with prior flares.  He has no abdominal pain, fever/chills, shortness of breath, chest pain, urinary symptoms, hematuria, rectal pain.  Patient states he gets an infusion every month for his Crohn's and is on prednisone daily.  Normally gets admitted for his flares.  He does take Plavix.  He denies any lightheadedness or shortness of breath, no syncope.  Patient states he feels well and in his normal state of health other than the bleeding per rectum.  The patient denies any abdominal pain.  He is on prednisone, Entyvio and Imuran for his Crohn's disease.   Last saw gastroenterology on 10/17/2021, on chronic prednisone, Entyvio 3 mg every 4 weeks, and azathioprine 150 mg daily.  Was scheduled for 53-monthfollow-up.   ED Course: the ER his BP was 141/90 with otherwise normal vital signs.  Labs revealed a BUN of 25 with a creatinine of 1.31 close to his baseline and ALT 54 with otherwise unremarkable CMP.  CBC showed hemoglobin of 13.2 and hematocrit of 42.7 compared to 14.6 and 45.6 on  10/24.   Review of Systems:  Review of Systems  Constitutional:  Negative for chills, fever and weight loss.  HENT: Negative.    Eyes: Negative.   Respiratory: Negative.    Cardiovascular:  Negative for chest pain, orthopnea and leg swelling.  Gastrointestinal:  Positive for blood in stool. Negative for abdominal pain, heartburn, nausea and vomiting.  Genitourinary: Negative.   Musculoskeletal: Negative.   Skin: Negative.   Neurological: Negative.   Endo/Heme/Allergies: Negative.   Psychiatric/Behavioral: Negative.      Past Medical History:  Diagnosis Date   Acquired renal cyst of right kidney 08/31/2018   2.8cm R upper pole rec monitor with yearly imaging (09/2018)   Allergy    BPH (benign prostatic hypertrophy)    CAD (coronary artery disease)    a. anterior STEMI 01/2018 -  proximal occlusion of LAD, treated with DES. Cath also showed 20% distal LM, 95% ostial-prox small-moderate ramus, 70-80% ostial Cx, 70% dominant ostial OM, 50-60% prox Cx, 70% RCA. EF 35% by cath with LVEDP 242mg. Med rx for residual disease. Course complicated by post MI pericarditis and LV thrombus.   Chronic systolic (congestive) heart failure (HCC)    Chronic systolic HF (heart failure) ([facility]/12/2021   Colitis    Colon polyp    inflammatory   Crohn disease ([facility]   history uveitis, involvement of intestines and lungs   Diverticulosis    Eosinophilic granuloma ([facility]   Essential hypertension    GERD (  gastroesophageal reflux disease)    GI bleeding    History of chicken pox    History of gastroesophageal reflux (GERD)    History of seizure 1995   grand mal x1, completed 6 yrs dilantin. no seizures since   Hyperlipidemia    Internal hemorrhoids    Ischemic cardiomyopathy    LV (left ventricular) mural thrombus following MI ([place]) 01/2018   Myocardial infarction ([place])    Osteoporosis 11/2015   DEXA T -2.9   Schwannoma 2007   L axilla s/p surgery   Seizures ([place])    last seizure 1995 and  only x 1 seizure-    Ulnar neuropathy    h/o this from L arm schwannoma    Past Surgical History:  Procedure Laterality Date   APPENDECTOMY  2000   BACK SURGERY  2011   lumbar   CARDIAC CATHETERIZATION     CARDIOVASCULAR STRESS TEST  01/2015   low risk study   CHOLECYSTECTOMY  2000   COLONOSCOPY  08/2014   f/u crohn's, 4 inflammatory polyps, diverticulosis, improved, rpt 2 yrs ([place])   COLONOSCOPY  11/2018   multiple polyps, 1 TA, rpt 2 yrs ([place])   COLONOSCOPY WITH PROPOFOL N/A 04/09/2018   inflammatory polyp, f/u left to primary GI - [place] [name], Kirke Corin, MD)   CORONARY ANGIOGRAPHY N/A 02/21/2018   Procedure: CORONARY ANGIOGRAPHY;  Surgeon: Belva Crome, MD;  Location: [place] [facility];  Service: Cardiovascular;  Laterality: N/A;   CORONARY STENT INTERVENTION N/A 08/06/2021   Procedure: CORONARY STENT INTERVENTION;  Surgeon: Belva Crome, MD;  Location: [place] [facility];  Service: Cardiovascular;  Laterality: N/A;   CORONARY/GRAFT ACUTE MI REVASCULARIZATION N/A 02/20/2018   Procedure: Coronary/Graft Acute MI Revascularization;  Surgeon: Belva Crome, MD;  Location: [place] [facility];  Service: Cardiovascular;  Laterality: N/A;   ESOPHAGOGASTRODUODENOSCOPY  07/2014   h/o EE resolved, focal reflux esophagitis, chronic active gastritis   ESOPHAGOGASTRODUODENOSCOPY  11/2018   gastritis, no active crohn's ([place])   extremity surgery Left    ulnar nerve repair after schwannoma removal   FLEXIBLE SIGMOIDOSCOPY N/A 04/14/2018   Procedure: FLEXIBLE SIGMOIDOSCOPY;  Surgeon: Milus Banister, MD;  Location: [facility] ENDOSCOPY;  Service: Endoscopy;  Laterality: N/A;   INGUINAL HERNIA REPAIR Bilateral 2017   LAMINECTOMY N/A 08/09/2018   Procedure: Lumbar three Laminectomy, excision of intradural tumor;  Surgeon: Kristeen Miss, MD;  Location: [facility];  Service: Neurosurgery;  Laterality: N/A;   LEFT HEART CATH AND CORONARY ANGIOGRAPHY N/A 02/20/2018   Procedure: LEFT HEART CATH AND  CORONARY ANGIOGRAPHY;  Surgeon: Belva Crome, MD;  Location: [place] [facility];  Service: Cardiovascular;  Laterality: N/A;   LEFT HEART CATH AND CORONARY ANGIOGRAPHY N/A 08/06/2021   Procedure: LEFT HEART CATH AND CORONARY ANGIOGRAPHY;  Surgeon: Belva Crome, MD;  Location: [place] [facility];  Service: Cardiovascular;  Laterality: N/A;   LUMBAR SPINE SURGERY  2011   POLYPECTOMY  04/09/2018   Procedure: POLYPECTOMY;  Surgeon: Doran Stabler, MD;  Location: [facility] ENDOSCOPY;  Service: Gastroenterology;;   SPINAL CORD STIMULATOR IMPLANT  2012   x2 (Dr Berton Mount)   [place]  04/09/2018   Procedure: SUBMUCOSAL INJECTION;  Surgeon: Doran Stabler, MD;  Location: [facility] ENDOSCOPY;  Service: Gastroenterology;;   TONSILLECTOMY  1996   TUMOR REMOVAL Left 2007   schwannoma from L armpit   UPPER GASTROINTESTINAL ENDOSCOPY      Soc Hx - married 31 years.  Has one son, one daughter. Each child has a son: 55 months old and 44 weeks old. 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    reports that he has been smoking cigars. He has never been exposed to tobacco smoke. He has never used smokeless tobacco. He reports current alcohol use. He reports that he does not use drugs.  Allergies  Allergen Reactions   Cephalexin Nausea And Vomiting    GI UPSET   Duloxetine Other (See Comments)    HEADACHE      Family History  Problem Relation Age of Onset   CAD Mother 36       CABG   Congenital heart disease Mother    Hypertension Mother    Hyperlipidemia Mother    CAD Father 9       CABG   Hypertension Father    Hyperlipidemia Father    Colon cancer Maternal Grandmother    Crohn's disease Brother    Diabetes Neg Hx    Colon polyps Neg Hx    Esophageal cancer Neg Hx    Rectal cancer Neg Hx    Stomach cancer Neg Hx     Prior to Admission medications   Medication Sig Start Date End Date Taking? Authorizing Provider  acetaminophen (TYLENOL) 325 MG tablet Take 2 tablets (650 mg total) by mouth every  4 (four) hours as needed for headache or mild pain. 08/07/21   Isaiah Serge, NP  alendronate (FOSAMAX) 70 MG tablet Take 70 mg by mouth every Saturday.  01/11/18   [provider]  amitriptyline (ELAVIL) 25 MG tablet Take 1 tablet (25 mg total) by mouth at bedtime as needed for sleep. 01/27/22   [name], MD  aspirin 81 MG chewable tablet Chew 1 tablet (81 mg total) by mouth daily. 08/08/21   Isaiah Serge, NP  atorvastatin (LIPITOR) 80 MG tablet TAKE 1 TABLET BY MOUTH DAILY AT 6 PM. 07/01/21   Belva Crome, MD  azaTHIOprine (IMURAN) 50 MG tablet TAKE 3 TABLETS BY MOUTH EVERY DAY 10/21/21   [place], Lajuan Lines, MD  Calcium Carbonate-Vitamin D3 (CALCIUM 600/VITAMIN D) 600-400 MG-UNIT TABS Take 1 tablet by mouth daily. 03/02/19   [name], MD  carvedilol (COREG) 3.125 MG tablet Take 1 tablet (3.125 mg total) by mouth 2 (two) times daily. 08/21/21   Richardson Dopp T, PA-C  clopidogrel (PLAVIX) 75 MG tablet TAKE 1 TABLET BY MOUTH EVERY DAY 07/25/21   Belva Crome, MD  dapagliflozin propanediol (FARXIGA) 10 MG TABS tablet Take 1 tablet (10 mg total) by mouth daily before breakfast. 07/02/21   Belva Crome, MD  diphenoxylate-atropine (LOMOTIL) 2.5-0.025 MG tablet TAKE 1-2 TABLETS BY MOUTH 4 TIMES A DAY AS NEEDED FOR DIARRHEA 09/19/19   [place], Lajuan Lines, MD  ezetimibe (ZETIA) 10 MG tablet TAKE 1 TABLET BY MOUTH EVERY DAY 11/22/21   Belva Crome, MD  finasteride (PROSCAR) 5 MG tablet Take 5 mg by mouth at bedtime. 12/26/20   [name], MD  icosapent Ethyl (VASCEPA) 1 g capsule TAKE 2 CAPSULES BY MOUTH 2 TIMES DAILY. 06/27/21   Belva Crome, MD  MAGNESIUM PO Take 1-2 tablets by mouth See admin instructions. Alternate taking 1 tablet one night and 2 tablets the next night    [provider]  Multiple Vitamin (MULTIVITAMIN) capsule Take 2 capsules by mouth daily.    [provider]  nitroGLYCERIN (NITROSTAT) 0.4 MG SL tablet Place 1 tablet (0.4 mg total) under the  tongue every 5 (five) minutes as needed for chest pain. 08/07/21   Isaiah Serge, NP  pantoprazole (PROTONIX) 40 MG tablet TAKE 1 TABLET BY MOUTH EVERY DAY 01/20/22   [name], MD  predniSONE (DELTASONE) 10 MG tablet Take as directed (Currently taking 21 mg daily- decrease by 1 mg as tolerated) 09/19/19   Pyrtle, Lajuan Lines, MD  predniSONE (DELTASONE) 5 MG tablet Take as directed. (currently on 21 mg daily- decrease by 1 mg as tolerated) 09/19/19   [place], Lajuan Lines, MD  sacubitril-valsartan (ENTRESTO) 24-26 MG Take 1 tablet by mouth 2 (two) times daily. 07/24/21   Belva Crome, MD  silodosin (RAPAFLO) 8 MG CAPS capsule Take 1 capsule (8 mg total) by mouth daily with breakfast. 12/26/20   [name], MD  spironolactone (ALDACTONE) 25 MG tablet Take 0.5 tablets (12.5 mg total) by mouth every Monday, Wednesday, and Friday. 07/03/21   Belva Crome, MD  tadalafil (CIALIS) 20 MG tablet Take 1 tablet (20 mg total) by mouth daily as needed for erectile dysfunction. 01/27/22   [name], MD  vedolizumab (ENTYVIO) 300 MG injection Inject 300 mg into the vein every 28 (twenty-eight) days.    [provider]    Physical Exam: Vitals:   02/13/22 0130 02/13/22 0145 02/13/22 0450 02/13/22 0537  BP:  127/86 127/76 128/84  Pulse: 62 61 (!) 59 (!) 55  Resp: 15 18 16 18   Temp:    97.7 F (36.5 C)  TempSrc:    Oral  SpO2: 97% 98% 98% 97%  Weight:      Height:        Physical Exam Constitutional:      General: He is in acute distress.     Appearance: Normal appearance. He is normal weight. He is not ill-appearing.  HENT:     Head: Normocephalic and atraumatic.     Mouth/Throat:     Mouth: Mucous membranes are moist.     Comments: Good dentition Eyes:     Extraocular Movements: Extraocular movements intact.     Conjunctiva/sclera: Conjunctivae normal.     Pupils: Pupils are equal, round, and reactive to light.  Neck:     Vascular: No carotid bruit.  Cardiovascular:     Rate  and Rhythm: Normal rate and regular rhythm.     Pulses: Normal pulses.     Heart sounds: Normal heart sounds.  Pulmonary:     Effort: Pulmonary effort is normal.     Breath sounds: No wheezing or rhonchi.  Abdominal:     General: Bowel sounds are normal. There is no distension.     Palpations: Abdomen is soft.     Tenderness: There is no abdominal tenderness.  Musculoskeletal:        General: No swelling. Normal range of motion.     Cervical back: Normal range of motion and neck supple. No rigidity.     Right lower leg: No edema.     Left lower leg: No edema.  Skin:    General: Skin is warm and dry.  Neurological:     General: No focal deficit present.     Mental Status: He is alert and oriented to person, place, and time.  Psychiatric:        Mood and Affect: Mood normal.        Behavior: Behavior normal.      Labs on Admission: I have personally reviewed following labs and imaging studies  CBC: Recent Labs  Lab  02/12/22 1559  WBC 10.9*  NEUTROABS 7.9*  HGB 13.2  HCT 42.7  MCV 96.2  PLT 315   Basic Metabolic Panel: Recent Labs  Lab 02/12/22 1559  NA 144  K 4.1  CL 103  CO2 32  GLUCOSE 123*  BUN 25*  CREATININE 1.31*  CALCIUM 9.3   GFR: Estimated Creatinine Clearance: 65.1 mL/min (A) (by C-G formula based on SCr of 1.31 mg/dL (H)). Liver Function Tests: Recent Labs  Lab 02/12/22 1559  AST 32  ALT 54*  ALKPHOS 32*  BILITOT 0.9  PROT 6.8  ALBUMIN 4.3   No results for input(s): "LIPASE", "AMYLASE" in the last 168 hours. No results for input(s): "AMMONIA" in the last 168 hours. Coagulation Profile: Recent Labs  Lab 02/12/22 1559  INR 1.1   Cardiac Enzymes: No results for input(s): "CKTOTAL", "CKMB", "CKMBINDEX", "TROPONINI" in the last 168 hours. BNP (last 3 results) Recent Labs    07/17/21 0835  PROBNP 470*   HbA1C: No results for input(s): "HGBA1C" in the last 72 hours. CBG: No results for input(s): "GLUCAP" in the last 168  hours. Lipid Profile: No results for input(s): "CHOL", "HDL", "LDLCALC", "TRIG", "CHOLHDL", "LDLDIRECT" in the last 72 hours. Thyroid Function Tests: No results for input(s): "TSH", "T4TOTAL", "FREET4", "T3FREE", "THYROIDAB" in the last 72 hours. Anemia Panel: No results for input(s): "VITAMINB12", "FOLATE", "FERRITIN", "TIBC", "IRON", "RETICCTPCT" in the last 72 hours. Urine analysis:    Component Value Date/Time   COLORURINE YELLOW 04/27/2021 [place] 04/27/2021 1437   LABSPEC 1.018 04/27/2021 1437   PHURINE 6.5 04/27/2021 1437   GLUCOSEU NEGATIVE 04/27/2021 1437   HGBUR TRACE (A) 04/27/2021 1437   BILIRUBINUR NEGATIVE 04/27/2021 1437   BILIRUBINUR negative 09/06/2019 [place] 04/27/2021 1437   PROTEINUR NEGATIVE 04/27/2021 1437   UROBILINOGEN 0.2 09/06/2019 1006   NITRITE NEGATIVE 04/27/2021 1437   LEUKOCYTESUR NEGATIVE 04/27/2021 1437    Radiological Exams on Admission: I have personally reviewed images No results found.  EKG: I have personally reviewed EKG: no new EKG on chart  Assessment/Plan Principal Problem:   Rectal bleeding Active Problems:   Crohn disease (HCC)   Chronic systolic HF (heart failure) (HCC)   Essential hypertension   Hyperlipidemia LDL goal <70   CKD (chronic kidney disease) stage 3, GFR 30-59 ml/min    Assessment and Plan: * Rectal bleeding Patient presents with several episodes of painless rectal bleeding: BRBPR w/o clots, w/o pain. In ED Hgb stable at 14g and BP stable. This is not like previous flares of Crohn's. Suspect diverticular bleed.  Plan Tele obs  H/H q 12  Hold ASA and Plavix - taken 2/2 cardiac stent  GI consult: watchful waiting vs colonoscopy  Chronic systolic HF (heart failure) ([place]) Patient is well compensated. He denies any change in respiratory function, denies peripheral edema. On exam lungs are clear.  Plan Continue home regimen  Crohn disease [facility]) Patient reports he hasn't had  a flare for a long time. His current symptoms are not similar to previous flares. He is currently on a complex regimen including prednisone 20 mg daily.  Plan Continue home regimen  CKD (chronic kidney disease) stage 3, GFR 30-59 ml/min ED lab reveals a very minor increase in Creatinine from baseline.  Plan Gentle hydration  Avoid nephrotoxic meds as possible  F/u Bmet in AM  Hyperlipidemia LDL goal <70 Last lipid panel 2 months ago - good control noted.  Plan Continue home regimen  Essential hypertension  BP stable  Plan Continue home meds       DVT prophylaxis:  TEDS Code Status: Full Code Family Communication: sp;oke with patient's wife on telephone. She understands working dx and tx plan.  Disposition Plan: TBD Consults called: GI - Dr. Candis Schatz  Admission status: Inpatient, Telemetry bed   Adella Hare, MD [facility] 02/13/2022, 8:07 AM

## 2022-02-13 NOTE — Progress Notes (Signed)
Notified MD of patients arrival to unit.  Pt Ax4, ambulates independently, skin intact with no skin issues.  Oriented to unit and call bell system, bed low and locked.  Personal items within reach.

## 2022-02-13 NOTE — Subjective & Objective (Signed)
Jack [name] is a 55 y.o. male with HTN, Crohn's disease, IDA secondary to IBD, HLD, HFrEF, CAD, BPH, eosinophilic esophagitis, Churg-Strauss syndrome with lung involvement, history of STEMI involving LAD 2019, CAD with PCI most recently May 2023 s/p 1 DES to RCA, CKD stage III, prostate cance, who presents with  rectal bleeding that began yesterday.  It is bright red in quality and copious in amount.  It is happening every time he goes to the bathroom and there is no stool in it, has happened approximately 3-4 times since yesterday.  Patient has history of Crohn's disease and this consistent with prior flares.  He has no abdominal pain, fever/chills, shortness of breath, chest pain, urinary symptoms, hematuria, rectal pain.  Patient states he gets an infusion every month for his Crohn's and is on prednisone daily.  Normally gets admitted for his flares.  He does take Plavix.  He denies any lightheadedness or shortness of breath, no syncope.  Patient states he feels well and in his normal state of health other than the bleeding per rectum.  The patient denies any abdominal pain.  He is on prednisone, Entyvio and Imuran for his Crohn's disease.   Last saw gastroenterology on 10/17/2021, on chronic prednisone, Entyvio 3 mg every 4 weeks, and azathioprine 150 mg daily.  Was scheduled for 79-monthfollow-up.

## 2022-02-14 DIAGNOSIS — K625 Hemorrhage of anus and rectum: Secondary | ICD-10-CM | POA: Diagnosis not present

## 2022-02-14 LAB — HEMOGLOBIN AND HEMATOCRIT, BLOOD
HCT: 43.3 % (ref 39.0–52.0)
Hemoglobin: 13.6 g/dL (ref 13.0–17.0)

## 2022-02-14 LAB — GLUCOSE, CAPILLARY: Glucose-Capillary: 111 mg/dL — ABNORMAL HIGH (ref 70–99)

## 2022-02-14 MED ORDER — ASPIRIN 81 MG PO TBEC
81.0000 mg | DELAYED_RELEASE_TABLET | Freq: Every day | ORAL | Status: DC
  Administered 2022-02-14: 81 mg via ORAL
  Filled 2022-02-14: qty 1

## 2022-02-14 MED ORDER — CLOPIDOGREL BISULFATE 75 MG PO TABS
75.0000 mg | ORAL_TABLET | Freq: Every day | ORAL | Status: DC
  Administered 2022-02-14: 75 mg via ORAL
  Filled 2022-02-14: qty 1

## 2022-02-14 NOTE — Progress Notes (Signed)
PIV removed. Discharge instructions completed. Patient verbalized understanding of medication regimen, follow up appointments and discharge instructions. Patient belongings gathered and packed to discharge.  

## 2022-02-14 NOTE — Discharge Summary (Signed)
Physician Discharge Summary   Patient: Jack [name] MRN: [id number] DOB: Jul 02, 1965  Admit date:     02/12/2022  Discharge date: 02/14/22  Discharge Physician: Elmarie Shiley   PCP: [name], MD   Recommendations at discharge:   Follow up with GI for further evaluation consideration for colonoscopy   Discharge Diagnoses: Principal Problem:   Rectal bleeding Active Problems:   Crohn disease ([place])   Chronic systolic HF (heart failure) (HCC)   Essential hypertension   Hyperlipidemia LDL goal <70   CKD (chronic kidney disease) stage 3, GFR 30-59 ml/min   ABLA (acute blood loss anemia)  Resolved Problems:   * No resolved hospital problems. *  Hospital Course: 56 year old with past medical history significant for hypertension, Crohn's disease, IDA secondary to IBD, hyperlipidemia, heart failure reduced ejection fraction, CAD, BPH, LOC nephritic esophagitis, Churg-Strauss syndrome with lung involvement, history of STEMI involving LAD 2019, CAD with PCI most recently May 2023 status post 1 DES to RCA, CKD stage II, prostate cancer who presented with rectal bleeding that is started the day prior to admission.  Bright red copious amount.  Happened 3-4 times .  Hemoglobin remained stable, GI was consulted.  If hemoglobin remained stable and no further bleed plan was to discharge patient with close follow-up as an outpatient.  Okay to resume aspirin and Plavix per GI.  Assessment and Plan: 1-GI bleed, hematochezia: Possible related to bleeding polyps, related to IBD. Hemoglobin remained stable at 13.  No future bloody bowel movement since admission.  Discussed case with GI plan to resume aspirin and Plavix and okay for patient to be discharged today.  Patient to follow-up with GI as needed.  He may need repeat colonoscopy in May 2024 when Plavix might be able to be stopped for 5 days.  2-Chronic systolic heart failure: Compensated continue with home regimen Chron's disease:  Stable. CKD stage II: Stable.  Patient has CKD stage II, GFR more than 60       Consultants: GI Procedures performed: None Disposition: Home Diet recommendation:  Discharge Diet Orders (From admission, onward)     Start     Ordered   02/14/22 0000  Diet - low sodium heart healthy        02/14/22 1013           Cardiac diet DISCHARGE MEDICATION: Allergies as of 02/14/2022       Reactions   Cymbalta [duloxetine Hcl] Other (See Comments)   Headaches   Keflex [cephalexin] Nausea And Vomiting   GI upset        Medication List     TAKE these medications    acetaminophen 325 MG tablet Commonly known as: TYLENOL Take 2 tablets (650 mg total) by mouth every 4 (four) hours as needed for headache or mild pain.   alendronate 70 MG tablet Commonly known as: FOSAMAX Take 70 mg by mouth every Saturday.   amitriptyline 25 MG tablet Commonly known as: ELAVIL Take 1 tablet (25 mg total) by mouth at bedtime as needed for sleep.   aspirin EC 81 MG tablet Take 81 mg by mouth daily.   atorvastatin 80 MG tablet Commonly known as: LIPITOR TAKE 1 TABLET BY MOUTH DAILY AT 6 PM. What changed: when to take this   azaTHIOprine 50 MG tablet Commonly known as: IMURAN TAKE 3 TABLETS BY MOUTH EVERY DAY What changed: when to take this   CALCIUM + VITAMIN D3 PO Take 1 tablet by mouth 2 (two) times daily.  carvedilol 3.125 MG tablet Commonly known as: COREG Take 1 tablet (3.125 mg total) by mouth 2 (two) times daily.   clopidogrel 75 MG tablet Commonly known as: PLAVIX TAKE 1 TABLET BY MOUTH EVERY DAY What changed: when to take this   dapagliflozin propanediol 10 MG Tabs tablet Commonly known as: Farxiga Take 1 tablet (10 mg total) by mouth daily before breakfast.   Entresto 24-26 MG Generic drug: sacubitril-valsartan Take 1 tablet by mouth 2 (two) times daily.   Entyvio 300 MG injection Generic drug: vedolizumab Inject 300 mg into the vein every 28 (twenty-eight)  days.   ezetimibe 10 MG tablet Commonly known as: ZETIA TAKE 1 TABLET BY MOUTH EVERY DAY What changed: when to take this   finasteride 5 MG tablet Commonly known as: PROSCAR Take 5 mg by mouth at bedtime.   icosapent Ethyl 1 g capsule Commonly known as: VASCEPA TAKE 2 CAPSULES BY MOUTH 2 TIMES DAILY. What changed: See the new instructions.   MAGNESIUM PO Take 1-2 tablets by mouth See admin instructions. Alternates taking 1 and 2 tablets at bedtime every other day   multivitamin capsule Take 2 capsules by mouth in the morning.   nitroGLYCERIN 0.4 MG SL tablet Commonly known as: NITROSTAT Place 1 tablet (0.4 mg total) under the tongue every 5 (five) minutes as needed for chest pain.   pantoprazole 40 MG tablet Commonly known as: PROTONIX TAKE 1 TABLET BY MOUTH EVERY DAY What changed: when to take this   predniSONE 10 MG tablet Commonly known as: DELTASONE Take as directed (Currently taking 21 mg daily- decrease by 1 mg as tolerated) What changed:  how much to take how to take this when to take this additional instructions Another medication with the same name was removed. Continue taking this medication, and follow the directions you see here.   silodosin 8 MG Caps capsule Commonly known as: RAPAFLO Take 8 mg by mouth in the morning.   spironolactone 25 MG tablet Commonly known as: ALDACTONE Take 0.5 tablets (12.5 mg total) by mouth every Monday, Wednesday, and Friday.   tadalafil 20 MG tablet Commonly known as: CIALIS Take 1 tablet (20 mg total) by mouth daily as needed for erectile dysfunction.        Follow-up Information     [name], MD Follow up.   Specialty: Family Medicine Contact information: [place] [address] [phone]                Discharge Exam: Danley Danker Weights   02/12/22 1425  Weight: 79.4 kg   General NAD  Condition at discharge: stable  The results of significant diagnostics from this  hospitalization (including imaging, microbiology, ancillary and laboratory) are listed below for reference.   Imaging Studies: No results found.  Microbiology: Results for orders placed or performed in visit on 05/01/21  Calprotectin, Fecal     Status: Abnormal   Collection Time: 05/01/21  8:59 AM   Specimen: Stool   Stool  Result Value Ref Range Status   Calprotectin, Fecal 349 (H) 0 - 120 ug/g Final    Comment: Concentration     Interpretation   Follow-Up <16 - 50 ug/g     Normal           None >50 -120 ug/g     Borderline       Re-evaluate in 4-6 weeks     >120 ug/g     Abnormal  Repeat as clinically                                    indicated   Clostridium Difficile by PCR     Status: None   Collection Time: 05/01/21  8:59 AM   Specimen: Stool   Stool  Result Value Ref Range Status   Toxigenic C. Difficile by PCR Negative Negative Final    Labs: CBC: Recent Labs  Lab 02/12/22 1559 02/13/22 0755 02/13/22 1537 02/13/22 2316 02/14/22 0646  WBC 10.9*  --   --   --   --   NEUTROABS 7.9*  --   --   --   --   HGB 13.2 12.1* 13.6 13.3 13.6  HCT 42.7 37.3* 43.5 40.6 43.3  MCV 96.2  --   --   --   --   PLT 321  --   --   --   --    Basic Metabolic Panel: Recent Labs  Lab 02/12/22 1559  NA 144  K 4.1  CL 103  CO2 32  GLUCOSE 123*  BUN 25*  CREATININE 1.31*  CALCIUM 9.3   Liver Function Tests: Recent Labs  Lab 02/12/22 1559  AST 32  ALT 54*  ALKPHOS 32*  BILITOT 0.9  PROT 6.8  ALBUMIN 4.3   CBG: Recent Labs  Lab 02/13/22 0929 02/13/22 1148 02/13/22 1636 02/13/22 1946 02/14/22 0746  GLUCAP 79 82 170* 131* 111*    Discharge time spent: greater than 30 minutes.  Signed: Elmarie Shiley, MD [facility] 02/14/2022

## 2022-02-21 ENCOUNTER — Other Ambulatory Visit: Payer: Self-pay | Admitting: Family Medicine

## 2022-03-03 DIAGNOSIS — K5090 Crohn's disease, unspecified, without complications: Secondary | ICD-10-CM | POA: Diagnosis not present

## 2022-03-10 ENCOUNTER — Other Ambulatory Visit: Payer: Self-pay | Admitting: Family Medicine

## 2022-03-11 NOTE — Telephone Encounter (Signed)
Too early. Rx sent on 01/27/22, #90/3 to [facility].

## 2022-04-14 DIAGNOSIS — K5090 Crohn's disease, unspecified, without complications: Secondary | ICD-10-CM | POA: Diagnosis not present

## 2022-04-16 LAB — BASIC METABOLIC PANEL
BUN: 20 (ref 4–21)
Creatinine: 1.2 (ref 0.6–1.3)
Glucose: 94
Potassium: 4.7 mEq/L (ref 3.5–5.1)
Sodium: 145 (ref 137–147)

## 2022-04-16 LAB — COMPREHENSIVE METABOLIC PANEL
Albumin: 3.1 — AB (ref 3.5–5.0)
Calcium: 8.7 (ref 8.7–10.7)
eGFR: 69

## 2022-04-16 LAB — HEPATIC FUNCTION PANEL
ALT: 101 U/L — AB (ref 10–40)
AST: 48 — AB (ref 14–40)
Alkaline Phosphatase: 42 (ref 25–125)
Bilirubin, Total: 0.7

## 2022-04-16 LAB — CBC AND DIFFERENTIAL
Hemoglobin: 13.1 — AB (ref 13.5–17.5)
Platelets: 344 10*3/uL (ref 150–400)
WBC: 10.9

## 2022-04-16 LAB — POCT ERYTHROCYTE SEDIMENTATION RATE, NON-AUTOMATED: Sed Rate: 4

## 2022-04-20 ENCOUNTER — Other Ambulatory Visit: Payer: Self-pay | Admitting: Internal Medicine

## 2022-04-20 ENCOUNTER — Other Ambulatory Visit: Payer: Self-pay | Admitting: Family Medicine

## 2022-04-22 ENCOUNTER — Ambulatory Visit (HOSPITAL_COMMUNITY): Payer: BC Managed Care – PPO | Attending: Interventional Cardiology

## 2022-04-22 DIAGNOSIS — I5022 Chronic systolic (congestive) heart failure: Secondary | ICD-10-CM | POA: Diagnosis not present

## 2022-04-22 LAB — ECHOCARDIOGRAM COMPLETE
Area-P 1/2: 2.75 cm2
Est EF: 30
S' Lateral: 3.65 cm

## 2022-04-22 MED ORDER — PERFLUTREN LIPID MICROSPHERE
1.0000 mL | INTRAVENOUS | Status: AC | PRN
  Administered 2022-04-22: 1 mL via INTRAVENOUS

## 2022-04-23 DIAGNOSIS — Z7952 Long term (current) use of systemic steroids: Secondary | ICD-10-CM | POA: Diagnosis not present

## 2022-04-23 DIAGNOSIS — I776 Arteritis, unspecified: Secondary | ICD-10-CM | POA: Diagnosis not present

## 2022-04-23 DIAGNOSIS — K5090 Crohn's disease, unspecified, without complications: Secondary | ICD-10-CM | POA: Diagnosis not present

## 2022-04-23 DIAGNOSIS — Z79899 Other long term (current) drug therapy: Secondary | ICD-10-CM | POA: Diagnosis not present

## 2022-05-02 ENCOUNTER — Other Ambulatory Visit: Payer: Self-pay | Admitting: *Deleted

## 2022-05-02 DIAGNOSIS — I5020 Unspecified systolic (congestive) heart failure: Secondary | ICD-10-CM

## 2022-05-12 DIAGNOSIS — K5090 Crohn's disease, unspecified, without complications: Secondary | ICD-10-CM | POA: Diagnosis not present

## 2022-05-13 ENCOUNTER — Telehealth: Payer: Self-pay

## 2022-05-13 NOTE — Telephone Encounter (Signed)
**Note De-Identified  Obfuscation** Jack [name] (Key: [id number]) Rx #: [id number] Outcome Approved today Effective from 05/13/2022 through 05/12/2023. Drug Vascepa 1GM capsules ePA cloud logo Form Psychologist, educational Request Form (CB) Original Claim Info 75 I have notified [facility] [place] [place] (Ph: [phone]) of this approval.

## 2022-05-15 ENCOUNTER — Emergency Department (HOSPITAL_COMMUNITY)
Admission: EM | Admit: 2022-05-15 | Discharge: 2022-05-15 | Disposition: A | Discharge status: Home / Self Care | Payer: BC Managed Care – PPO | Attending: Emergency Medicine | Admitting: Emergency Medicine

## 2022-05-15 ENCOUNTER — Emergency Department (HOSPITAL_COMMUNITY): Payer: BC Managed Care – PPO

## 2022-05-15 ENCOUNTER — Encounter (HOSPITAL_COMMUNITY): Payer: Self-pay

## 2022-05-15 ENCOUNTER — Other Ambulatory Visit: Payer: Self-pay

## 2022-05-15 DIAGNOSIS — Z79899 Other long term (current) drug therapy: Secondary | ICD-10-CM | POA: Diagnosis not present

## 2022-05-15 DIAGNOSIS — I2510 Atherosclerotic heart disease of native coronary artery without angina pectoris: Secondary | ICD-10-CM | POA: Diagnosis not present

## 2022-05-15 DIAGNOSIS — D72829 Elevated white blood cell count, unspecified: Secondary | ICD-10-CM | POA: Insufficient documentation

## 2022-05-15 DIAGNOSIS — R42 Dizziness and giddiness: Secondary | ICD-10-CM | POA: Diagnosis not present

## 2022-05-15 DIAGNOSIS — K5090 Crohn's disease, unspecified, without complications: Secondary | ICD-10-CM | POA: Diagnosis not present

## 2022-05-15 DIAGNOSIS — R001 Bradycardia, unspecified: Secondary | ICD-10-CM | POA: Insufficient documentation

## 2022-05-15 DIAGNOSIS — Z7982 Long term (current) use of aspirin: Secondary | ICD-10-CM | POA: Diagnosis not present

## 2022-05-15 DIAGNOSIS — Z951 Presence of aortocoronary bypass graft: Secondary | ICD-10-CM | POA: Diagnosis not present

## 2022-05-15 DIAGNOSIS — R531 Weakness: Secondary | ICD-10-CM | POA: Diagnosis not present

## 2022-05-15 DIAGNOSIS — Z7902 Long term (current) use of antithrombotics/antiplatelets: Secondary | ICD-10-CM | POA: Diagnosis not present

## 2022-05-15 DIAGNOSIS — R55 Syncope and collapse: Secondary | ICD-10-CM | POA: Diagnosis not present

## 2022-05-15 DIAGNOSIS — I509 Heart failure, unspecified: Secondary | ICD-10-CM | POA: Diagnosis not present

## 2022-05-15 DIAGNOSIS — I959 Hypotension, unspecified: Secondary | ICD-10-CM | POA: Diagnosis not present

## 2022-05-15 LAB — BASIC METABOLIC PANEL
Anion gap: 9 (ref 5–15)
BUN: 21 mg/dL — ABNORMAL HIGH (ref 6–20)
CO2: 26 mmol/L (ref 22–32)
Calcium: 8.6 mg/dL — ABNORMAL LOW (ref 8.9–10.3)
Chloride: 102 mmol/L (ref 98–111)
Creatinine, Ser: 1.16 mg/dL (ref 0.61–1.24)
GFR, Estimated: >60 mL/min (ref >60)
Glucose, Bld: 111 mg/dL — ABNORMAL HIGH (ref 70–99)
Potassium: 3.9 mmol/L (ref 3.5–5.1)
Sodium: 137 mmol/L (ref 135–145)

## 2022-05-15 LAB — CBC WITH DIFFERENTIAL/PLATELET
Abs Immature Granulocytes: 0.13 10*3/uL — ABNORMAL HIGH (ref 0.00–0.07)
Basophils Absolute: 0.1 10*3/uL (ref 0.0–0.1)
Basophils Relative: 1 %
Eosinophils Absolute: 0.1 10*3/uL (ref 0.0–0.5)
Eosinophils Relative: 1 %
HCT: 42.4 % (ref 39.0–52.0)
Hemoglobin: 13.2 g/dL (ref 13.0–17.0)
Immature Granulocytes: 1 %
Lymphocytes Relative: 9 %
Lymphs Abs: 1 10*3/uL (ref 0.7–4.0)
MCH: 28.9 pg (ref 26.0–34.0)
MCHC: 31.1 g/dL (ref 30.0–36.0)
MCV: 93 fL (ref 80.0–100.0)
Monocytes Absolute: 0.7 10*3/uL (ref 0.1–1.0)
Monocytes Relative: 6 %
Neutro Abs: 9.5 10*3/uL — ABNORMAL HIGH (ref 1.7–7.7)
Neutrophils Relative %: 82 %
Platelets: 322 10*3/uL (ref 150–400)
RBC: 4.56 MIL/uL (ref 4.22–5.81)
RDW: 14.9 % (ref 11.5–15.5)
WBC: 11.6 10*3/uL — ABNORMAL HIGH (ref 4.0–10.5)
nRBC: 0 % (ref 0.0–0.2)

## 2022-05-15 LAB — TROPONIN I (HIGH SENSITIVITY)
Troponin I (High Sensitivity): 10 ng/L (ref <18)
Troponin I (High Sensitivity): 10 ng/L (ref <18)

## 2022-05-15 LAB — BRAIN NATRIURETIC PEPTIDE: B Natriuretic Peptide: 143.8 pg/mL — ABNORMAL HIGH (ref 0.0–100.0)

## 2022-05-15 MED ORDER — SODIUM CHLORIDE 0.9 % IV BOLUS
500.0000 mL | Freq: Once | INTRAVENOUS | Status: AC
  Administered 2022-05-15: 500 mL via INTRAVENOUS

## 2022-05-15 NOTE — ED Provider Notes (Signed)
[name]   CSN: [id number] Arrival date & time: 05/15/22  1359     History  Chief Complaint  Patient presents with   Dizziness   Hypotension    Jack [name] is a 57 y.o. male.  Patient is a 57 year old male with a past medical history of CAD status post stent placement, CHF, Crohn's presenting to the emergency department with a near syncopal event.  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  He states that he sat down and called 911.  He states that when medics arrived they told him that his blood pressure was low.  He states that since he arrived to the ED his dizziness has resolved.  He denied any chest pain or shortness of breath.  He states that he drank plenty of water today but has not eaten anything.  He denies any recent medication changes.  He denies any black or bloody stools.  He states that he is scheduled to have an ICD placed soon.  The history is provided by the patient and the spouse.  Dizziness      Home Medications Prior to Admission medications   Medication Sig Start Date End Date Taking? Authorizing Provider  acetaminophen (TYLENOL) 325 MG tablet Take 2 tablets (650 mg total) by mouth every 4 (four) hours as needed for headache or mild pain. 08/07/21   Isaiah Serge, NP  alendronate (FOSAMAX) 70 MG tablet Take 70 mg by mouth every Saturday.  01/11/18   [provider]  amitriptyline (ELAVIL) 25 MG tablet Take 1 tablet (25 mg total) by mouth at bedtime as needed for sleep. 01/27/22   [name], MD  aspirin EC 81 MG tablet Take 81 mg by mouth daily.    [provider]  atorvastatin (LIPITOR) 80 MG tablet TAKE 1 TABLET BY MOUTH DAILY AT 6  PM. Patient taking differently: Take 80 mg by mouth at bedtime. 07/01/21   Belva Crome, MD  azaTHIOprine (IMURAN) 50 MG tablet Take 3 tablets (150 mg total) by mouth at bedtime. 04/21/22   Pyrtle, Lajuan Lines, MD  Calcium Carb-Cholecalciferol (CALCIUM + VITAMIN D3 PO) Take 1 tablet by mouth 2 (two) times daily.    [provider]  carvedilol (COREG) 3.125 MG tablet Take 1 tablet (3.125 mg total) by mouth 2 (two) times daily. 08/21/21   Richardson Dopp T, PA-C  clopidogrel (PLAVIX) 75 MG tablet TAKE 1 TABLET BY MOUTH EVERY DAY Patient taking differently: Take 75 mg by mouth in the morning. 07/25/21   Belva Crome, MD  dapagliflozin propanediol (FARXIGA) 10 MG TABS tablet Take 1 tablet (10 mg total) by mouth daily before breakfast. 07/02/21   Belva Crome, MD  ezetimibe (ZETIA) 10 MG tablet TAKE 1 TABLET BY MOUTH EVERY DAY Patient taking differently: Take 10 mg by mouth at bedtime. 11/22/21   Belva Crome, MD  finasteride (PROSCAR) 5 MG tablet Take 5 mg by mouth at bedtime. 12/26/20   [name], MD  icosapent Ethyl (VASCEPA) 1 g capsule TAKE 2 CAPSULES BY MOUTH 2 TIMES DAILY. Patient taking differently: Take 2 g by mouth 2 (two) times daily. 06/27/21   Belva Crome, MD  MAGNESIUM PO Take 1-2 tablets by mouth See admin instructions. Alternates taking 1 and 2 tablets at bedtime every other day    [provider]  Multiple Vitamin (MULTIVITAMIN) capsule Take 2 capsules by mouth in the morning.    [provider]  nitroGLYCERIN (NITROSTAT) 0.4 MG SL tablet Place 1 tablet (0.4 mg total) under the tongue every 5 (five) minutes as needed for chest pain. 08/07/21   Isaiah Serge, NP  pantoprazole (PROTONIX) 40 MG tablet TAKE 1 TABLET BY MOUTH EVERY DAY 04/21/22   [name], MD  predniSONE (DELTASONE) 10 MG tablet Take as directed (Currently taking 21 mg daily- decrease by 1 mg as tolerated) Patient taking differently: Take 20 mg by mouth in the morning. 09/19/19   Pyrtle,  Lajuan Lines, MD  sacubitril-valsartan (ENTRESTO) 24-26 MG Take 1 tablet by mouth 2 (two) times daily. 07/24/21   Belva Crome, MD  silodosin (RAPAFLO) 8 MG CAPS capsule Take 8 mg by mouth in the morning. 12/26/20   [name], MD  spironolactone (ALDACTONE) 25 MG tablet Take 0.5 tablets (12.5 mg total) by mouth every Monday, Wednesday, and Friday. 07/03/21   Belva Crome, MD  tadalafil (CIALIS) 20 MG tablet Take 1 tablet (20 mg total) by mouth daily as needed for erectile dysfunction. 01/27/22   [name], MD  vedolizumab (ENTYVIO) 300 MG injection Inject 300 mg into the vein every 28 (twenty-eight) days.    [provider]      Allergies    Cymbalta [duloxetine hcl] and Keflex [cephalexin]    Review of Systems   Review of Systems  Neurological:  Positive for dizziness.    Physical Exam Updated Vital Signs BP 100/60   Pulse (!) 59   Temp 98.2 F (36.8 C)   Resp 17   Ht 5' 7"$  (1.702 m)   Wt 72.6 kg   SpO2 95%   BMI 25.06 kg/m  Physical Exam Vitals and nursing note reviewed.  Constitutional:      General: He is not in acute distress.    Appearance: Normal appearance.  HENT:     Head: Normocephalic and atraumatic.     Nose: Nose normal.     Mouth/Throat:     Mouth: Mucous membranes are moist.     Pharynx: Oropharynx is clear.  Eyes:     Extraocular Movements: Extraocular movements intact.     Conjunctiva/sclera: Conjunctivae normal.  Cardiovascular:     Rate and Rhythm: Normal rate and regular rhythm.     Pulses: Normal pulses.     Heart sounds: Normal heart sounds.  Pulmonary:     Effort: Pulmonary effort is normal.     Breath sounds: Normal breath sounds.  Abdominal:     General: Abdomen is flat.     Palpations: Abdomen is soft.     Tenderness: There is no abdominal tenderness.  Musculoskeletal:        General: Normal range of motion.     Cervical back: Normal range of motion and neck supple.     Right lower leg: No edema.     Left lower leg:  No edema.  Skin:    General: Skin is warm and dry.  Neurological:     General: No focal deficit present.     Mental Status: He is alert and oriented to person, place, and time.     Sensory: No sensory deficit.     Motor: No weakness.  Psychiatric:  Mood and Affect: Mood normal.        Behavior: Behavior normal.     ED Results / Procedures / Treatments   Labs (all labs ordered are listed, but only abnormal results are displayed) Labs Reviewed  BASIC METABOLIC PANEL - Abnormal; Notable for the following components:      Result Value   Glucose, Bld 111 (*)    BUN 21 (*)    Calcium 8.6 (*)    All other components within normal limits  BRAIN NATRIURETIC PEPTIDE - Abnormal; Notable for the following components:   B Natriuretic Peptide 143.8 (*)    All other components within normal limits  CBC WITH DIFFERENTIAL/PLATELET - Abnormal; Notable for the following components:   WBC 11.6 (*)    Neutro Abs 9.5 (*)    Abs Immature Granulocytes 0.13 (*)    All other components within normal limits  CBG MONITORING, ED  TROPONIN I (HIGH SENSITIVITY)  TROPONIN I (HIGH SENSITIVITY)    EKG EKG Interpretation  Date/Time:  Thursday May 15 2022 14:06:58 EST Ventricular Rate:  58 PR Interval:  126 QRS Duration: 98 QT Interval:  442 QTC Calculation: 433 R Axis:   23 Text Interpretation: Sinus bradycardia Anteroseptal infarct , age undetermined Abnormal ECG No significant change since last tracing Confirmed by Leanord Asal ([id number]) on 05/15/2022 6:02:51 PM  Radiology DG Chest 2 View  Result Date: 05/15/2022 CLINICAL DATA:  weakness, hypotension EXAM: CHEST - 2 VIEW COMPARISON:  02/20/2018 FINDINGS: Cardiac silhouette is unremarkable. No pneumothorax or pleural effusion. The lungs are clear. Aorta is calcified. The visualized skeletal structures are unremarkable. Thoracolumbar spine stimulation wires noted. IMPRESSION: No acute cardiopulmonary process. Electronically Signed   By:  Sammie Bench M.D.   On: 05/15/2022 15:53    Procedures Procedures    Medications Ordered in ED Medications  sodium chloride 0.9 % bolus 500 mL (0 mLs Intravenous Stopped 05/15/22 1921)    ED Course/ Medical Decision Making/ A&P Clinical Course as of 05/15/22 1922  Thu May 15, 2022  1914 Orthostatics negative and patient was asymptomatic.  He states that he feels back to his baseline.  His hypotension on medics arrival, dizziness is likely secondary to orthostatic hypotension. He has remained on the cardiac monitor without any arrhythmias.  He states that he has cardiology follow-up on 2/28 and he was recommended to call them in the morning.  He is stable for discharge home with tricked return precautions. [VK]    Clinical Course User Index [VK] Kemper Durie, DO                             Medical Decision Making This patient presents to the ED with chief complaint(s) of near syncope with pertinent past medical history of CAD, CHF, Crohn's which further complicates the presenting complaint. The complaint involves an extensive differential diagnosis and also carries with it a high risk of complications and morbidity.    The differential diagnosis includes ACS, arrhythmia, anemia, electrolyte abnormality, dehydration  Additional history obtained: Additional history obtained from family Records reviewed cardiology records  ED Course and Reassessment: Patient was initially evaluated by provider in triage and had EKG and labs performed.  EKG showed sinus bradycardia without acute ischemic changes.  Labs showed mild leukocytosis and otherwise within normal range.  Chest x-ray is without acute disease.  Patient will be given a small IV fluid bolus and will have orthostatic vitals performed.  Independent labs interpretation:  The following labs were independently interpreted: Within normal range  Independent visualization of imaging: - I independently visualized the following  imaging with scope of interpretation limited to determining acute life threatening conditions related to emergency care: Chest x-ray, which revealed no acute disease  Consultation: - Consulted or discussed management/test interpretation w/ external professional: N/A  Consideration for admission or further workup: Patient has no emergent conditions requiring admission or further work-up at this time and is stable for discharge home with primary care and cardiology follow-up  Social Determinants of health: N/A            Final Clinical Impression(s) / ED Diagnoses Final diagnoses:  Near syncope    Rx / DC Orders ED Discharge Orders     None         Kemper Durie, DO 05/15/22 1922

## 2022-05-15 NOTE — ED Triage Notes (Signed)
Pt BIB [facility] from d/t Lightheadedness & dizziness that started after he walked 5 miles on the treadmill & then tried to move a mattress off the top of a car. When EMS arrived he was hypotensive as well, A/Ox4. 88/68, 58 bpm, 98% RA, CBG 110, 18g Lt AC PIC received 100cc NS. Pt reports that he has an appointment later on this month with his cardiologist for defib placement.

## 2022-05-15 NOTE — Discharge Instructions (Signed)
You were seen in the emergency department for your dizziness.  Your workup showed no abnormal heart rhythms, no severe dehydration and no signs of heart attack or anemia.  Your blood pressure was low when the ambulance first evaluated you and this may have been due to not eating anything today and with more exercise than you normally do.  You should make sure that you are eating and drinking regularly.  You should follow-up with your cardiologist in the next few days to have your symptoms rechecked.  You should return to the emergency department if you have recurrent episodes of lightheadedness, you completely pass out, you have severe chest pains or shortness of breath or if you have any other new or concerning symptoms.

## 2022-05-15 NOTE — ED Provider Triage Note (Signed)
Emergency Medicine Provider Triage Evaluation Note  Jack [name] , a 57 y.o. male  was evaluated in triage.  Pt complains of lightheadedness. States he walked about 5 miles this morning.  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  He states that he sat down and symptoms seem to wear off.  He states that he stood up and symptoms returned.  He denies currently having chest pain, shortness of breath or palpitations but states that he is very fatigued.  Has a history of MI, CHF with a EF of 30% and is to have defibrillator placement at some point in the future.  Review of Systems  Positive: See above Negative:   Physical Exam  BP 103/67 (BP Location: Right Arm)   Pulse (!) 58   Temp 97.8 F (36.6 C) (Oral)   Resp 16   Ht 5' 7"$  (1.702 m)   Wt 72.6 kg   SpO2 97%   BMI 25.06 kg/m  Gen:   Awake, no distress   Resp:  Normal effort  MSK:   Moves extremities without difficulty  Other:  Tired appearing  Medical Decision Making  Medically screening exam initiated at 2:43 PM.  Appropriate orders placed.  Jack [name] was informed that the remainder of the evaluation will be completed by another provider, this initial triage assessment does not replace that evaluation, and the importance of remaining in the ED until their evaluation is complete.     Mickie Hillier, PA-C 05/15/22 [phone]

## 2022-05-16 ENCOUNTER — Telehealth: Payer: Self-pay | Admitting: Interventional Cardiology

## 2022-05-16 NOTE — Telephone Encounter (Signed)
No changes.  Stay hydrated and eat regularly to avoid low blood sugar, particularly when exercising

## 2022-05-16 NOTE — Telephone Encounter (Signed)
Patient states that he was taken to ED 02/15 due to near syncope and they advised him to reach out to cardiologist to see if there is anything he needs to be doing differently or if he needs to be seen sooner. Requesting return call.

## 2022-05-16 NOTE — Telephone Encounter (Signed)
I spoke with patient's wife and gave her message from Dr Irish Lack

## 2022-05-16 NOTE — Telephone Encounter (Signed)
I spoke with patient's wife.  She reports patient was at work yesterday afternoon around 1:30 and started feeling like he may pass out.  Patient sat down and EMS was called. He was evaluated in [facility] ED. BP was initially 88/68.  Wife reports patient drank water and walked on treadmill yesterday morning.  He did not eat anything yesterday prior to episode.  He has been having issues with his Crohn's disease recently.  Was discharged home yesterday.  Today he feels tired but otherwise feels OK.  He has appointment with Dr Irish Lack on 2/28 and Dr Lovena Le on 3/5.  I advised patient's wife to plan on keeping this appointment but to let us know if any problems prior to appointment.  I let her know note would be sent to Dr Irish Lack and we would let her know if he had any new recommendations.

## 2022-05-16 NOTE — Telephone Encounter (Signed)
Left message to call office

## 2022-05-27 ENCOUNTER — Other Ambulatory Visit: Payer: Self-pay | Admitting: Internal Medicine

## 2022-05-27 NOTE — Progress Notes (Unsigned)
Cardiology Office Note   Date:  05/28/2022   ID:  Jack [name], DOB [birth year], MRN [id number]  PCP:  [name], MD    No chief complaint on file.  CAD  Wt Readings from Last 3 Encounters:  05/28/22 167 lb 9.6 oz (76 kg)  05/15/22 160 lb (72.6 kg)  02/12/22 175 lb (79.4 kg)       History of Present Illness: Jack [name] is a 57 y.o. male  former patient of Dr. Tamala Julian, with a hx of CAD status post STEMI 11/2017 treated with DES to the LAD, and DES RCA 07/2026, post infarct pericarditis 2019, decreased LV systolic function EF [id number] with LV thrombus requiring Coumadin. Developed a significant GI bleed on Coumadin and dual antiplatelet therapy. Also has essential hypertension, seizure disorder, eosinophilic granulomatosis, Crohn's disease removal of intra-dural meningioma of the lower spine.     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  He called in on May 16, 2022 with the following: "She reports patient was at work yesterday afternoon around 1:30 and started feeling like he may pass out. Patient sat down and EMS was called. He was evaluated in [facility] ED. BP was initially 88/68. Wife reports patient drank water and walked on treadmill yesterday morning. He did not eat anything yesterday prior to episode. He has been having issues with his Crohn's disease recently. Was discharged home yesterday. Today he feels tired but otherwise feels OK."   He reports that he walked more than normal that day and had not eaten or had anything to drink.  He was moving mattresses at work.    No further episodes since that day.  He has been eating and drinking more.   Denies : Chest pain. Dizziness. Leg edema. Nitroglycerin use. Orthopnea. Palpitations. Paroxysmal nocturnal dyspnea.  Syncope.    He reports some shortness of breath.   Past Medical History:  Diagnosis Date   Acquired renal cyst of right kidney 08/31/2018   2.8cm R upper pole  rec monitor with yearly imaging (09/2018)   Allergy    BPH (benign prostatic hypertrophy)    CAD (coronary artery disease)    a. anterior STEMI 01/2018 -  proximal occlusion of LAD, treated with DES. Cath also showed 20% distal LM, 95% ostial-prox small-moderate ramus, 70-80% ostial Cx, 70% dominant ostial OM, 50-60% prox Cx, 70% RCA. EF 35% by cath with LVEDP 42mHg. Med rx for residual disease. Course complicated by post MI pericarditis and LV thrombus.   Chronic systolic (congestive) heart failure (HCC)    Chronic systolic HF (heart failure) ([facility] 08/07/2021   Colitis    Colon polyp    inflammatory   Crohn disease ([facility] 1992   history uveitis, involvement of intestines and lungs   Diverticulosis    Eosinophilic granuloma ([facility]    Essential hypertension    GERD (gastroesophageal reflux disease)    GI bleeding    History of chicken pox    History of gastroesophageal reflux (GERD)    History of seizure 1995   grand mal x1, completed 6 yrs dilantin. no seizures since   Hyperlipidemia    Internal hemorrhoids    Ischemic cardiomyopathy    LV (left ventricular) mural thrombus following MI ([facility] 01/2018   Myocardial infarction ([facility]    Osteoporosis 11/2015   DEXA T -2.9   Schwannoma 2007   L axilla s/p surgery   Seizures ([facility]    last  seizure 1995 and only x 1 seizure-    Ulnar neuropathy    h/o this from L arm schwannoma    Past Surgical History:  Procedure Laterality Date   APPENDECTOMY  2000   BACK SURGERY  2011   lumbar   CARDIAC CATHETERIZATION     CARDIOVASCULAR STRESS TEST  01/2015   low risk study   CHOLECYSTECTOMY  2000   COLONOSCOPY  08/2014   f/u crohn's, 4 inflammatory polyps, diverticulosis, improved, rpt 2 yrs ([place])   COLONOSCOPY  11/2018   multiple polyps, 1 TA, rpt 2 yrs ([place])   COLONOSCOPY WITH PROPOFOL N/A 04/09/2018   inflammatory polyp, f/u left to primary GI - [place] [name], Kirke Corin, MD)   CORONARY ANGIOGRAPHY N/A 02/21/2018   Procedure:  CORONARY ANGIOGRAPHY;  Surgeon: Belva Crome, MD;  Location: [place] [facility];  Service: Cardiovascular;  Laterality: N/A;   CORONARY STENT INTERVENTION N/A 08/06/2021   Procedure: CORONARY STENT INTERVENTION;  Surgeon: Belva Crome, MD;  Location: [place] [facility];  Service: Cardiovascular;  Laterality: N/A;   CORONARY/GRAFT ACUTE MI REVASCULARIZATION N/A 02/20/2018   Procedure: Coronary/Graft Acute MI Revascularization;  Surgeon: Belva Crome, MD;  Location: [place] [facility];  Service: Cardiovascular;  Laterality: N/A;   ESOPHAGOGASTRODUODENOSCOPY  07/2014   h/o EE resolved, focal reflux esophagitis, chronic active gastritis   ESOPHAGOGASTRODUODENOSCOPY  11/2018   gastritis, no active crohn's ([place])   extremity surgery Left    ulnar nerve repair after schwannoma removal   FLEXIBLE SIGMOIDOSCOPY N/A 04/14/2018   Procedure: FLEXIBLE SIGMOIDOSCOPY;  Surgeon: Milus Banister, MD;  Location: [facility] ENDOSCOPY;  Service: Endoscopy;  Laterality: N/A;   INGUINAL HERNIA REPAIR Bilateral 2017   LAMINECTOMY N/A 08/09/2018   Procedure: Lumbar three Laminectomy, excision of intradural tumor;  Surgeon: Kristeen Miss, MD;  Location: [place];  Service: Neurosurgery;  Laterality: N/A;   LEFT HEART CATH AND CORONARY ANGIOGRAPHY N/A 02/20/2018   Procedure: LEFT HEART CATH AND CORONARY ANGIOGRAPHY;  Surgeon: Belva Crome, MD;  Location: [place] [facility];  Service: Cardiovascular;  Laterality: N/A;   LEFT HEART CATH AND CORONARY ANGIOGRAPHY N/A 08/06/2021   Procedure: LEFT HEART CATH AND CORONARY ANGIOGRAPHY;  Surgeon: Belva Crome, MD;  Location: [place] [facility];  Service: Cardiovascular;  Laterality: N/A;   LUMBAR SPINE SURGERY  2011   POLYPECTOMY  04/09/2018   Procedure: POLYPECTOMY;  Surgeon: Doran Stabler, MD;  Location: [facility] ENDOSCOPY;  Service: Gastroenterology;;   SPINAL CORD STIMULATOR IMPLANT  2012   x2 (Dr Berton Mount)   [place]  04/09/2018   Procedure: SUBMUCOSAL INJECTION;   Surgeon: Doran Stabler, MD;  Location: [place];  Service: Gastroenterology;;   TONSILLECTOMY  1996   TUMOR REMOVAL Left 2007   schwannoma from L armpit   UPPER GASTROINTESTINAL ENDOSCOPY       Current Outpatient Medications  Medication Sig Dispense Refill   acetaminophen (TYLENOL) 325 MG tablet Take 2 tablets (650 mg total) by mouth every 4 (four) hours as needed for headache or mild pain.     alendronate (FOSAMAX) 70 MG tablet Take 70 mg by mouth every Saturday.   11   amitriptyline (ELAVIL) 25 MG tablet Take 1 tablet (25 mg total) by mouth at bedtime as needed for sleep. 90 tablet 3   aspirin EC 81 MG tablet Take 81 mg by mouth daily.     atorvastatin (LIPITOR) 80 MG tablet TAKE 1 TABLET BY MOUTH DAILY  AT 6 PM. (Patient taking differently: Take 80 mg by mouth at bedtime.) 90 tablet 3   azaTHIOprine (IMURAN) 50 MG tablet TAKE 3 TABLETS BY MOUTH AT BEDTIME. 270 tablet 0   Calcium Carb-Cholecalciferol (CALCIUM + VITAMIN D3 PO) Take 1 tablet by mouth 2 (two) times daily.     carvedilol (COREG) 3.125 MG tablet Take 1 tablet (3.125 mg total) by mouth 2 (two) times daily. 180 tablet 3   clopidogrel (PLAVIX) 75 MG tablet TAKE 1 TABLET BY MOUTH EVERY DAY (Patient taking differently: Take 75 mg by mouth in the morning.) 90 tablet 3   dapagliflozin propanediol (FARXIGA) 10 MG TABS tablet Take 1 tablet (10 mg total) by mouth daily before breakfast. 30 tablet 11   ezetimibe (ZETIA) 10 MG tablet TAKE 1 TABLET BY MOUTH EVERY DAY (Patient taking differently: Take 10 mg by mouth at bedtime.) 90 tablet 3   finasteride (PROSCAR) 5 MG tablet Take 5 mg by mouth at bedtime.     icosapent Ethyl (VASCEPA) 1 g capsule TAKE 2 CAPSULES BY MOUTH 2 TIMES DAILY. (Patient taking differently: Take 2 g by mouth 2 (two) times daily.) 120 capsule 11   MAGNESIUM PO Take 1-2 tablets by mouth See admin instructions. Alternates taking 1 and 2 tablets at bedtime every other day     Multiple Vitamin (MULTIVITAMIN)  capsule Take 2 capsules by mouth in the morning.     nitroGLYCERIN (NITROSTAT) 0.4 MG SL tablet Place 1 tablet (0.4 mg total) under the tongue every 5 (five) minutes as needed for chest pain. 25 tablet 4   pantoprazole (PROTONIX) 40 MG tablet TAKE 1 TABLET BY MOUTH EVERY DAY 90 tablet 2   predniSONE (DELTASONE) 10 MG tablet Take as directed (Currently taking 21 mg daily- decrease by 1 mg as tolerated) (Patient taking differently: Take 20 mg by mouth in the morning.) 100 tablet 0   sacubitril-valsartan (ENTRESTO) 24-26 MG Take 1 tablet by mouth 2 (two) times daily. 180 tablet 3   silodosin (RAPAFLO) 8 MG CAPS capsule Take 8 mg by mouth in the morning.     spironolactone (ALDACTONE) 25 MG tablet Take 0.5 tablets (12.5 mg total) by mouth every Monday, Wednesday, and Friday. 20 tablet 3   tadalafil (CIALIS) 20 MG tablet Take 1 tablet (20 mg total) by mouth daily as needed for erectile dysfunction. 20 tablet 6   vedolizumab (ENTYVIO) 300 MG injection Inject 300 mg into the vein every 28 (twenty-eight) days.     diphenoxylate-atropine (LOMOTIL) 2.5-0.025 MG tablet Take 1 tablet by mouth 4 (four) times daily as needed for diarrhea or loose stools.     No current facility-administered medications for this visit.   Facility-Administered Medications Ordered in Other Visits  Medication Dose Route Frequency Provider Last Rate Last Admin   ondansetron (ZOFRAN) 4 mg in sodium chloride 0.9 % 50 mL IVPB  4 mg Intravenous Q6H PRN Kristeen Miss, MD        Allergies:   Cymbalta [duloxetine hcl], Infliximab, Aspirin, and Keflex [cephalexin]    Social History:  The patient  reports that he has been smoking cigars. He has never been exposed to tobacco smoke. He has never used smokeless tobacco. He reports current alcohol use. He reports that he does not use drugs.   Family History:  The patient's family history includes CAD (age of onset: 36) in his father and mother; Colon cancer in his maternal grandmother;  Congenital heart disease in his mother; Crohn's disease in his brother; Hyperlipidemia  in his father and mother; Hypertension in his father and mother.    ROS:  Please see the history of present illness.   Otherwise, review of systems are positive for occasional GI upset related to Crohns.   All other systems are reviewed and negative.    PHYSICAL EXAM: VS:  BP (!) 100/56   Pulse 70   Ht '5\' 2"'$  (1.575 m)   Wt 167 lb 9.6 oz (76 kg)   SpO2 97%   BMI 30.65 kg/m  , BMI Body mass index is 30.65 kg/m. GEN: Well nourished, well developed, in no acute distress HEENT: normal Neck: no JVD, carotid bruits, or masses Cardiac: RRR; no murmurs, rubs, or gallops,no edema  Respiratory:  clear to auscultation bilaterally, normal work of breathing GI: soft, nontender, nondistended, + BS MS: no deformity or atrophy Skin: warm and dry, no rash Neuro:  Strength and sensation are intact Psych: euthymic mood, full affect    Recent Labs: 07/17/2021: NT-Pro BNP 470 02/12/2022: ALT 54 05/15/2022: B Natriuretic Peptide 143.8; BUN 21; Creatinine, Ser 1.16; Hemoglobin 13.2; Platelets 322; Potassium 3.9; Sodium 137   Lipid Panel    Component Value Date/Time   CHOL 116 11/25/2021 0858   TRIG 72 11/25/2021 0858   HDL 43 11/25/2021 0858   CHOLHDL 2.7 11/25/2021 0858   CHOLHDL 3 07/16/2020 0844   VLDL 22.0 07/16/2020 0844   LDLCALC 58 11/25/2021 0858     Other studies Reviewed: Additional studies/ records that were reviewed today with results demonstrating: labs reviewed.   ASSESSMENT AND PLAN:  CAD/Old MI: No angina. COntinue secondary prevention. On DAPT.  Seeing Dr. Lovena Le to consider AICD due to EF 30% on medical therapy.  Hyperlipidemia: LDL 53. Continue atorvastatin. Chronic systolic heart failure: No CHF sx. pending Daily weights.  Consider adding as needed Lasix for weight gain.  Weight has been stable.  He has not been retaining fluid.  We also spoke about the importance of eating and  drinking regularly, particularly when being physically active to avoid episodes like he had in mid February. Hypertension: The current medical regimen is effective;  continue present plan and medications.   OSA: Sleep study was cancelled  Not interested in sleep study; poor sleeping attributed to frequent urination.  Anticoagulated with warfarin: temporary after MI.   [place] vasculitis: Steroids chronically.     Current medicines are reviewed at length with the patient today.  The patient concerns regarding his medicines were addressed.  The following changes have been made:  No change  Labs/ tests ordered today include:  No orders of the defined types were placed in this encounter.   Recommend 150 minutes/week of aerobic exercise Low fat, low carb, high fiber diet recommended  Disposition:   FU in 1 year   Signed, Larae Grooms, MD  05/28/2022 9:43 AM    [facility] [address], [place], [place]  [address] Phone: [phone]; Fax: [phone]

## 2022-05-28 ENCOUNTER — Ambulatory Visit: Payer: BC Managed Care – PPO | Attending: Interventional Cardiology | Admitting: Interventional Cardiology

## 2022-05-28 ENCOUNTER — Encounter: Payer: Self-pay | Admitting: Interventional Cardiology

## 2022-05-28 VITALS — BP 100/56 | HR 70 | Ht 62.0 in | Wt 167.6 lb

## 2022-05-28 DIAGNOSIS — E785 Hyperlipidemia, unspecified: Secondary | ICD-10-CM | POA: Diagnosis not present

## 2022-05-28 DIAGNOSIS — I5020 Unspecified systolic (congestive) heart failure: Secondary | ICD-10-CM

## 2022-05-28 DIAGNOSIS — I252 Old myocardial infarction: Secondary | ICD-10-CM

## 2022-05-28 DIAGNOSIS — I2510 Atherosclerotic heart disease of native coronary artery without angina pectoris: Secondary | ICD-10-CM

## 2022-05-28 DIAGNOSIS — G4733 Obstructive sleep apnea (adult) (pediatric): Secondary | ICD-10-CM

## 2022-05-28 DIAGNOSIS — I10 Essential (primary) hypertension: Secondary | ICD-10-CM | POA: Diagnosis not present

## 2022-05-28 NOTE — Patient Instructions (Signed)
Medication Instructions:  Your physician recommends that you continue on your current medications as directed. Please refer to the Current Medication list given to you today.  *If you need a refill on your cardiac medications before your next appointment, please call your pharmacy*   Lab Work: none If you have labs (blood work) drawn today and your tests are completely normal, you will receive your results only by: [place] (if you have MyChart) OR A paper copy in the mail If you have any lab test that is abnormal or we need to change your treatment, we will call you to review the results.   Testing/Procedures: none   Follow-Up: At [facility], you and your health needs are our priority.  As part of our continuing mission to provide you with exceptional heart care, we have created designated Provider Care Teams.  These Care Teams include your primary Cardiologist (physician) and Advanced Practice Providers (APPs -  Physician Assistants and Nurse Practitioners) who all work together to provide you with the care you need, when you need it.  We recommend signing up for the patient portal called "MyChart".  Sign up information is provided on this After Visit Summary.  MyChart is used to connect with patients for Virtual Visits (Telemedicine).  Patients are able to view lab/test results, encounter notes, upcoming appointments, etc.  Non-urgent messages can be sent to your provider as well.   To learn more about what you can do with MyChart, go to [email or web address].    Your next appointment:   12 month(s)  Provider:   Larae Grooms, MD     Other Instructions Weigh yourself every morning and keep record of readings.  Call office if you gain 3 lbs overnight or 5 lbs in a week.  High-Fiber Eating Plan Fiber, also called dietary fiber, is a type of carbohydrate. It is found foods such as fruits, vegetables, whole grains, and beans. A high-fiber diet can have many  health benefits. Your health care provider may recommend a high-fiber diet to help: Prevent constipation. Fiber can make your bowel movements more regular. Lower your cholesterol. Relieve the following conditions: Inflammation of veins in the anus (hemorrhoids). Inflammation of specific areas of the digestive tract (uncomplicated diverticulosis). A problem of the large intestine, also called the colon, that sometimes causes pain and diarrhea (irritable bowel syndrome, or IBS). Prevent overeating as part of a weight-loss plan. Prevent heart disease, type 2 diabetes, and certain cancers. What are tips for following this plan? Reading food labels  Check the nutrition facts label on food products for the amount of dietary fiber. Choose foods that have 5 grams of fiber or more per serving. The goals for recommended daily fiber intake include: Men (age 15 or younger): 34-38 g. Men (over age 39): 28-34 g. Women (age 27 or younger): 25-28 g. Women (over age 57): 22-25 g. Your daily fiber goal is _____________ g. Shopping Choose whole fruits and vegetables instead of processed forms, such as apple juice or applesauce. Choose a wide variety of high-fiber foods such as avocados, lentils, oats, and kidney beans. Read the nutrition facts label of the foods you choose. Be aware of foods with added fiber. These foods often have high sugar and sodium amounts per serving. Cooking Use whole-grain flour for baking and cooking. Cook with brown rice instead of white rice. Meal planning Start the day with a breakfast that is high in fiber, such as a cereal that contains 5 g of fiber or more  per serving. Eat breads and cereals that are made with whole-grain flour instead of refined flour or white flour. Eat brown rice, bulgur wheat, or millet instead of white rice. Use beans in place of meat in soups, salads, and pasta dishes. Be sure that half of the grains you eat each day are whole grains. General  information You can get the recommended daily intake of dietary fiber by: Eating a variety of fruits, vegetables, grains, nuts, and beans. Taking a fiber supplement if you are not able to take in enough fiber in your diet. It is better to get fiber through food than from a supplement. Gradually increase how much fiber you consume. If you increase your intake of dietary fiber too quickly, you may have bloating, cramping, or gas. Drink plenty of water to help you digest fiber. Choose high-fiber snacks, such as berries, raw vegetables, nuts, and popcorn. What foods should I eat? Fruits Berries. Pears. Apples. Oranges. Avocado. Prunes and raisins. Dried figs. Vegetables Sweet potatoes. Spinach. Kale. Artichokes. Cabbage. Broccoli. Cauliflower. Green peas. Carrots. Squash. Grains Whole-grain breads. Multigrain cereal. Oats and oatmeal. Brown rice. Barley. Bulgur wheat. [place]. Quinoa. Bran muffins. Popcorn. Rye wafer crackers. Meats and other proteins Navy beans, kidney beans, and pinto beans. Soybeans. Split peas. Lentils. Nuts and seeds. Dairy Fiber-fortified yogurt. Beverages Fiber-fortified soy milk. Fiber-fortified orange juice. Other foods Fiber bars. The items listed above may not be a complete list of recommended foods and beverages. Contact a dietitian for more information. What foods should I avoid? Fruits Fruit juice. Cooked, strained fruit. Vegetables Fried potatoes. Canned vegetables. Well-cooked vegetables. Grains White bread. Pasta made with refined flour. White rice. Meats and other proteins Fatty cuts of meat. Fried chicken or fried fish. Dairy Milk. Yogurt. Cream cheese. Sour cream. Fats and oils Butters. Beverages Soft drinks. Other foods Cakes and pastries. The items listed above may not be a complete list of foods and beverages to avoid. Talk with your dietitian about what choices are best for you. Summary Fiber is a type of carbohydrate. It is found in foods  such as fruits, vegetables, whole grains, and beans. A high-fiber diet has many benefits. It can help to prevent constipation, lower blood cholesterol, aid weight loss, and reduce your risk of heart disease, diabetes, and certain cancers. Increase your intake of fiber gradually. Increasing fiber too quickly may cause cramping, bloating, and gas. Drink plenty of water while you increase the amount of fiber you consume. The best sources of fiber include whole fruits and vegetables, whole grains, nuts, seeds, and beans. This information is not intended to replace advice given to you by your health care provider. Make sure you discuss any questions you have with your health care provider. Document Revised: 07/21/2019 Document Reviewed: 07/21/2019 Elsevier Patient Education  [place].

## 2022-05-29 DIAGNOSIS — D692 Other nonthrombocytopenic purpura: Secondary | ICD-10-CM | POA: Diagnosis not present

## 2022-05-29 DIAGNOSIS — D2261 Melanocytic nevi of right upper limb, including shoulder: Secondary | ICD-10-CM | POA: Diagnosis not present

## 2022-05-29 DIAGNOSIS — D2372 Other benign neoplasm of skin of left lower limb, including hip: Secondary | ICD-10-CM | POA: Diagnosis not present

## 2022-05-29 DIAGNOSIS — D2262 Melanocytic nevi of left upper limb, including shoulder: Secondary | ICD-10-CM | POA: Diagnosis not present

## 2022-05-30 ENCOUNTER — Other Ambulatory Visit: Payer: Self-pay

## 2022-05-30 MED ORDER — SPIRONOLACTONE 25 MG PO TABS
12.5000 mg | ORAL_TABLET | ORAL | 3 refills | Status: DC
  Filled 2022-11-13: qty 18, 84d supply, fill #0
  Filled 2023-02-03: qty 18, 84d supply, fill #1
  Filled 2023-04-28: qty 18, 84d supply, fill #2

## 2022-06-03 ENCOUNTER — Encounter: Payer: Self-pay | Admitting: Internal Medicine

## 2022-06-03 ENCOUNTER — Ambulatory Visit: Payer: BC Managed Care – PPO | Attending: Internal Medicine | Admitting: Internal Medicine

## 2022-06-03 VITALS — BP 118/60 | HR 63 | Ht 67.0 in | Wt 171.4 lb

## 2022-06-03 DIAGNOSIS — I10 Essential (primary) hypertension: Secondary | ICD-10-CM | POA: Diagnosis not present

## 2022-06-03 DIAGNOSIS — I5022 Chronic systolic (congestive) heart failure: Secondary | ICD-10-CM

## 2022-06-03 NOTE — Patient Instructions (Addendum)
Medication Instructions:  Your physician recommends that you continue on your current medications as directed. Please refer to the Current Medication list given to you today.  *If you need a refill on your cardiac medications before your next appointment, please call your pharmacy*  Lab Work: You will have labs drawn today, CBC and BMET in our [facility]  Testing/Procedures: None ordered.  Follow-Up:  Dr. [name] has ordered a MDT ICD implant, and it has been scheduled for 06/23/2022, procedure time 730 am.  You will arrive at [facility] 2 hours early, the day of your procedure.     See the ICD instruction booklet.      Cardioverter Defibrillator Implantation An implantable cardioverter defibrillator (ICD) is a device that identifies and corrects abnormal heart rhythms. Cardioverter defibrillator implantation is a surgery to place an ICD under the skin in the chest or abdomen. An ICD has a battery, a small computer (pulse generator), and wires (leads) that go into the heart. The ICD detects and corrects two types of dangerous irregular heart rhythms (arrhythmias): A rapid heart rhythm in the lower chambers of the heart (ventricles). This is called ventricular tachycardia. The ventricles contracting in an uncoordinated way. This is called ventricular fibrillation. There are different types of ICDs, and the electrical signals from the ICD can be programmed differently based on the condition being treated. The electrical signals from the ICD can be low-energy pulses, high-energy shocks, or a combination of the two. The low-energy pulses are generally used to restore the heartbeat to normal when it is either too slow (bradycardia) or too fast. These pulses are painless. The high-energy shocks are used to treat abnormal rhythms such as ventricular tachycardia or ventricular fibrillation. This shock may feel like a strong jolt in the chest. Your health care provider may recommend an  ICD if you have: Had a ventricular arrhythmia in the past. A damaged heart because of a disease or heart condition. A weakened heart muscle from a heart attack or cardiac arrest. A congenital heart defect. Long QT syndrome, which is a disorder of the heart's electrical system. Brugada syndrome, which is a condition that causes a disruption of the heart's normal rhythm. Tell a health care provider about: Any allergies you have. All medicines you are taking, including vitamins, herbs, eye drops, creams, and over-the-counter medicines. Any problems you or family members have had with anesthetic medicines. Any blood disorders you have. Any surgeries you have had. Any medical conditions you have. Whether you are pregnant or may be pregnant. What are the risks? Generally, this is a safe procedure. However, problems may occur, including: Infection. Bleeding. Allergic reactions to medicines used during the procedure. Blood clots. Swelling or bruising. Damage to nearby structures or organs, such as nerves, lungs, blood vessels, or the heart where the ICD leads or pulse generator is implanted. What happens before the procedure? Staying hydrated Follow instructions from your health care provider about hydration, which may include: Up to 2 hours before the procedure - you may continue to drink clear liquids, such as water, clear fruit juice, black coffee, and plain tea.  Eating and drinking restrictions Follow instructions from your health care provider about eating and drinking, which may include: 8 hours before the procedure - stop eating heavy meals or foods, such as meat, fried foods, or fatty foods. 6 hours before the procedure - stop eating light meals or foods, such as toast or cereal. 6 hours before the procedure - stop drinking milk  or drinks that contain milk. 2 hours before the procedure - stop drinking clear liquids. Medicines Ask your health care provider about: Changing or  stopping your regular medicines. This is especially important if you are taking diabetes medicines or blood thinners. Taking medicines such as aspirin and ibuprofen. These medicines can thin your blood. Do not take these medicines unless your health care provider tells you to take them. Taking over-the-counter medicines, vitamins, herbs, and supplements. Tests You may have an exam or testing. These may include: Blood tests. A test to check the electrical signals in your heart (electrocardiogram, ECG). Imaging tests, such as a chest X-ray. Echocardiogram. This is an ultrasound of your heart to evaluate your heart structures and function. An event monitor or Holter monitor to wear at home. General instructions Do not use any products that contain nicotine or tobacco for at least 4 weeks before the procedure. These products include cigarettes, chewing tobacco, and vaping devices, such as e-cigarettes. If you need help quitting, ask your health care provider. Ask your health care provider: How your procedure site will be marked. What steps will be taken to help prevent infection. These may include: Removing hair at the surgery site. Washing skin with a germ-killing soap. Taking antibiotic medicine. You may be asked to shower with a germ-killing soap. Plan to have a responsible adult take you home from the hospital or clinic. What happens during the procedure?  Small monitors will be put on your body. They will be used to check your heart rate, blood pressure, and oxygen level. A pair of sticky pads (defibrillator pads) may be placed on your back and chest. These pads are able to pace your heart as needed during the procedure. An IV will be inserted into one of your veins. You will be given one or more of the following: A medicine to help you relax (sedative). A medicine to numb the area (local anesthetic). A medicine to make you fall asleep(general anesthetic). A small incision will be made  to create a deep pocket under the skin of your chest or abdomen. Leads will be guided through a blood vessel into your heart and attached to your heart muscles. Depending on the ICD, the leads may go into one ventricle, or they may go into both ventricles and into an upper chamber of the heart. An X-ray machine (fluoroscope) will be used to help guide the leads. The other end of the leads will be attached to the pulse generator. The pulse generator will be placed into the pocket under the skin. The ICD will be tested, and your health care provider will program the ICD for the condition being treated. The incision will be closed with stitches (sutures), skin glue, adhesive strips, or staples. A bandage (dressing) will be placed over the incision. The procedure may vary among health care providers and hospitals. What happens after the procedure? Your blood pressure, heart rate, breathing rate, and blood oxygen level will be monitored until you leave the hospital or clinic. Your health care provider will also monitor your ICD to make sure it is working properly. A chest X-ray will be taken to check that the ICD is in the right place. Do not raise the arm on the side of your procedure higher than your shoulder for as long as told by your health care provider. This is usually at least 6 weeks. You may be given an identification card explaining that you have an ICD. You will be given a remote home  monitoring device to use with your ICD to allow your device to communicate with your clinic. Summary An implantable cardioverter defibrillator (ICD) is a device that identifies and corrects abnormal heart rhythms. Cardioverter defibrillator implantation is a surgery to place an ICD under the skin in the chest or abdomen. An ICD consists of a battery, a small computer (pulse generator), and wires (leads) that go into the heart. During the procedure, the ICD will be tested, and your health care provider will  program the ICD for the condition being treated. After the procedure, a chest X-ray will be taken to check that the ICD is in the right place. This information is not intended to replace advice given to you by your health care provider. Make sure you discuss any questions you have with your health care provider. Document Revised: 09/14/2019 Document Reviewed: 09/14/2019 Elsevier Patient Education  [place].

## 2022-06-03 NOTE — H&P (View-Only) (Signed)
    HPI Mr. Strole is referred for consideration for ICD insertion by Dr. Smith. He is a pleasant 56 yo man with CAD, s/p PCI of the LAD and RCA with old anteroseptal MI and known aneurysm. He has a narrow QRS. He has been on maximal guideline directed medical therapy. He notes an episode of near syncope associated with hypotension. He has not had angina and remains active selling mattresses.  Allergies  Allergen Reactions   Cymbalta [Duloxetine Hcl] Other (See Comments)    Headaches   Infliximab     Other Reaction(s): Unknown   Aspirin Hives and Rash    Other Reaction(s): Unknown   Keflex [Cephalexin] Nausea And Vomiting    GI upset     Current Outpatient Medications  Medication Sig Dispense Refill   acetaminophen (TYLENOL) 325 MG tablet Take 2 tablets (650 mg total) by mouth every 4 (four) hours as needed for headache or mild pain.     alendronate (FOSAMAX) 70 MG tablet Take 70 mg by mouth every Saturday.   11   amitriptyline (ELAVIL) 25 MG tablet Take 1 tablet (25 mg total) by mouth at bedtime as needed for sleep. 90 tablet 3   aspirin EC 81 MG tablet Take 81 mg by mouth daily.     atorvastatin (LIPITOR) 80 MG tablet TAKE 1 TABLET BY MOUTH DAILY AT 6 PM. 90 tablet 3   azaTHIOprine (IMURAN) 50 MG tablet TAKE 3 TABLETS BY MOUTH AT BEDTIME. 270 tablet 0   Calcium Carb-Cholecalciferol (CALCIUM + VITAMIN D3 PO) Take 1 tablet by mouth 2 (two) times daily.     carvedilol (COREG) 3.125 MG tablet Take 1 tablet (3.125 mg total) by mouth 2 (two) times daily. 180 tablet 3   clopidogrel (PLAVIX) 75 MG tablet TAKE 1 TABLET BY MOUTH EVERY DAY 90 tablet 3   dapagliflozin propanediol (FARXIGA) 10 MG TABS tablet Take 1 tablet (10 mg total) by mouth daily before breakfast. 30 tablet 11   diphenoxylate-atropine (LOMOTIL) 2.5-0.025 MG tablet Take 1 tablet by mouth 4 (four) times daily as needed for diarrhea or loose stools.     ezetimibe (ZETIA) 10 MG tablet TAKE 1 TABLET BY MOUTH EVERY DAY 90  tablet 3   finasteride (PROSCAR) 5 MG tablet Take 5 mg by mouth at bedtime.     icosapent Ethyl (VASCEPA) 1 g capsule TAKE 2 CAPSULES BY MOUTH 2 TIMES DAILY. 120 capsule 11   MAGNESIUM PO Take 1-2 tablets by mouth See admin instructions. Alternates taking 1 and 2 tablets at bedtime every other day     Multiple Vitamin (MULTIVITAMIN) capsule Take 2 capsules by mouth in the morning.     nitroGLYCERIN (NITROSTAT) 0.4 MG SL tablet Place 1 tablet (0.4 mg total) under the tongue every 5 (five) minutes as needed for chest pain. 25 tablet 4   pantoprazole (PROTONIX) 40 MG tablet TAKE 1 TABLET BY MOUTH EVERY DAY 90 tablet 2   predniSONE (DELTASONE) 10 MG tablet Take as directed (Currently taking 21 mg daily- decrease by 1 mg as tolerated) 100 tablet 0   sacubitril-valsartan (ENTRESTO) 24-26 MG Take 1 tablet by mouth 2 (two) times daily. 180 tablet 3   silodosin (RAPAFLO) 8 MG CAPS capsule Take 8 mg by mouth in the morning.     spironolactone (ALDACTONE) 25 MG tablet Take 0.5 tablets (12.5 mg total) by mouth every Monday, Wednesday, and Friday. 24 tablet 3   tadalafil (CIALIS) 20 MG tablet Take 1 tablet (20 mg total)   by mouth daily as needed for erectile dysfunction. 20 tablet 6   vedolizumab (ENTYVIO) 300 MG injection Inject 300 mg into the vein every 28 (twenty-eight) days.     No current facility-administered medications for this visit.   Facility-Administered Medications Ordered in Other Visits  Medication Dose Route Frequency Provider Last Rate Last Admin   ondansetron (ZOFRAN) 4 mg in sodium chloride 0.9 % 50 mL IVPB  4 mg Intravenous Q6H PRN Elsner, Henry, MD         Past Medical History:  Diagnosis Date   Acquired renal cyst of right kidney 08/31/2018   2.8cm R upper pole rec monitor with yearly imaging (09/2018)   Allergy    BPH (benign prostatic hypertrophy)    CAD (coronary artery disease)    a. anterior STEMI 01/2018 -  proximal occlusion of LAD, treated with DES. Cath also showed 20%  distal LM, 95% ostial-prox small-moderate ramus, 70-80% ostial Cx, 70% dominant ostial OM, 50-60% prox Cx, 70% RCA. EF 35% by cath with LVEDP 24mmHg. Med rx for residual disease. Course complicated by post MI pericarditis and LV thrombus.   Chronic systolic (congestive) heart failure (HCC)    Chronic systolic HF (heart failure) (HCC) 08/07/2021   Colitis    Colon polyp    inflammatory   Crohn disease (HCC) 1992   history uveitis, involvement of intestines and lungs   Diverticulosis    Eosinophilic granuloma (HCC)    Essential hypertension    GERD (gastroesophageal reflux disease)    GI bleeding    History of chicken pox    History of gastroesophageal reflux (GERD)    History of seizure 1995   grand mal x1, completed 6 yrs dilantin. no seizures since   Hyperlipidemia    Internal hemorrhoids    Ischemic cardiomyopathy    LV (left ventricular) mural thrombus following MI (HCC) 01/2018   Myocardial infarction (HCC)    Osteoporosis 11/2015   DEXA T -2.9   Schwannoma 2007   L axilla s/p surgery   Seizures (HCC)    last seizure 1995 and only x 1 seizure-    Ulnar neuropathy    h/o this from L arm schwannoma    ROS:   All systems reviewed and negative except as noted in the HPI.   Past Surgical History:  Procedure Laterality Date   APPENDECTOMY  2000   BACK SURGERY  2011   lumbar   CARDIAC CATHETERIZATION     CARDIOVASCULAR STRESS TEST  01/2015   low risk study   CHOLECYSTECTOMY  2000   COLONOSCOPY  08/2014   f/u crohn's, 4 inflammatory polyps, diverticulosis, improved, rpt 2 yrs ([facility])   COLONOSCOPY  11/2018   multiple polyps, 1 TA, rpt 2 yrs ([facility])   COLONOSCOPY WITH PROPOFOL N/A 04/09/2018   inflammatory polyp, f/u left to primary GI - [facility] (Danis, Henry L III, MD)   CORONARY ANGIOGRAPHY N/A 02/21/2018   Procedure: CORONARY ANGIOGRAPHY;  Surgeon: Smith, Henry W, MD;  Location: [facility];  Service: Cardiovascular;  Laterality: N/A;   CORONARY STENT  INTERVENTION N/A 08/06/2021   Procedure: CORONARY STENT INTERVENTION;  Surgeon: Smith, Henry W, MD;  Location: [facility];  Service: Cardiovascular;  Laterality: N/A;   CORONARY/GRAFT ACUTE MI REVASCULARIZATION N/A 02/20/2018   Procedure: Coronary/Graft Acute MI Revascularization;  Surgeon: Smith, Henry W, MD;  Location: [facility];  Service: Cardiovascular;  Laterality: N/A;   ESOPHAGOGASTRODUODENOSCOPY  07/2014   h/o EE resolved, focal   reflux esophagitis, chronic active gastritis   ESOPHAGOGASTRODUODENOSCOPY  11/2018   gastritis, no active crohn's ([facility])   extremity surgery Left    ulnar nerve repair after schwannoma removal   FLEXIBLE SIGMOIDOSCOPY N/A 04/14/2018   Procedure: FLEXIBLE SIGMOIDOSCOPY;  Surgeon: Jacobs, Daniel P, MD;  Location: [facility];  Service: Endoscopy;  Laterality: N/A;   INGUINAL HERNIA REPAIR Bilateral 2017   LAMINECTOMY N/A 08/09/2018   Procedure: Lumbar three Laminectomy, excision of intradural tumor;  Surgeon: Elsner, Henry, MD;  Location: [facility];  Service: Neurosurgery;  Laterality: N/A;   LEFT HEART CATH AND CORONARY ANGIOGRAPHY N/A 02/20/2018   Procedure: LEFT HEART CATH AND CORONARY ANGIOGRAPHY;  Surgeon: Smith, Henry W, MD;  Location: [facility];  Service: Cardiovascular;  Laterality: N/A;   LEFT HEART CATH AND CORONARY ANGIOGRAPHY N/A 08/06/2021   Procedure: LEFT HEART CATH AND CORONARY ANGIOGRAPHY;  Surgeon: Smith, Henry W, MD;  Location: [facility];  Service: Cardiovascular;  Laterality: N/A;   LUMBAR SPINE SURGERY  2011   POLYPECTOMY  04/09/2018   Procedure: POLYPECTOMY;  Surgeon: Danis, Henry L III, MD;  Location: [facility];  Service: Gastroenterology;;   SPINAL CORD STIMULATOR IMPLANT  2012   x2 (Dr Grassi)   SUBMUCOSAL INJECTION  04/09/2018   Procedure: SUBMUCOSAL INJECTION;  Surgeon: Danis, Henry L III, MD;  Location: [facility];  Service: Gastroenterology;;   TONSILLECTOMY  1996   TUMOR REMOVAL Left 2007    schwannoma from L armpit   UPPER GASTROINTESTINAL ENDOSCOPY       Family History  Problem Relation Age of Onset   CAD Mother 70       CABG   Congenital heart disease Mother    Hypertension Mother    Hyperlipidemia Mother    CAD Father 70       CABG   Hypertension Father    Hyperlipidemia Father    Colon cancer Maternal Grandmother    Crohn's disease Brother    Diabetes Neg Hx    Colon polyps Neg Hx    Esophageal cancer Neg Hx    Rectal cancer Neg Hx    Stomach cancer Neg Hx      Social History   Socioeconomic History   Marital status: Married    Spouse name: Laura   Number of children: 2   Years of education: 16   Highest education level: Bachelor's degree (e.g., BA, AB, BS)  Occupational History   Occupation: Self-employed    Comment: Patient owns 2 mattress stores  Tobacco Use   Smoking status: Some Days    Types: Cigars    Passive exposure: Never   Smokeless tobacco: Never  Vaping Use   Vaping Use: Never used  Substance and Sexual Activity   Alcohol use: Yes    Comment: Occasional   Drug use: No   Sexual activity: Not on file  Other Topics Concern   Not on file  Social History Narrative   Lives with wife, 1 dog. Grown children   Edu: college   Occ: owns mattress store   Activity: active at work, started crossfit, wants to restart running   Diet: good water, fruits/vegetables daily   Social Determinants of Health   Financial Resource Strain: Low Risk  (05/11/2018)   Overall Financial Resource Strain (CARDIA)    Difficulty of Paying Living Expenses: Not hard at all  Food Insecurity: No Food Insecurity (02/13/2022)   Hunger Vital Sign    Worried About Running Out of Food in   the Last Year: Never true    Ran Out of Food in the Last Year: Never true  Transportation Needs: No Transportation Needs (02/13/2022)   PRAPARE - Transportation    Lack of Transportation (Medical): No    Lack of Transportation (Non-Medical): No  Physical Activity: Inactive  (05/11/2018)   Exercise Vital Sign    Days of Exercise per Week: 0 days    Minutes of Exercise per Session: 0 min  Stress: No Stress Concern Present (05/11/2018)   [employer] - Occupational Stress Questionnaire    Feeling of Stress : Only a little  Social Connections: Not on file  Intimate Partner Violence: Not At Risk (02/13/2022)   Humiliation, Afraid, Rape, and Kick questionnaire    Fear of Current or Ex-Partner: No    Emotionally Abused: No    Physically Abused: No    Sexually Abused: No     BP 118/60   Pulse 63   Ht 5' 7" (1.702 m)   Wt 171 lb 6.4 oz (77.7 kg)   SpO2 97%   BMI 26.85 kg/m   Physical Exam:  Well appearing NAD HEENT: Unremarkable Neck:  No JVD, no thyromegally Lymphatics:  No adenopathy Back:  No CVA tenderness Lungs:  Clear HEART:  Regular rate rhythm, no murmurs, no rubs, no clicks Abd:  soft, positive bowel sounds, no organomegally, no rebound, no guarding Ext:  2 plus pulses, no edema, no cyanosis, no clubbing Skin:  No rashes no nodules Neuro:  CN II through XII intact, motor grossly intact  EKG - reviewed. NSR with ASMI   Assess/Plan: ICM/chronic systolic heart failure - the patient is class 2 and has an EF of 30% with an ICM. I have discussed the indications/risks/benefits/goals/expectations of ICD insertion and he wishes to proceed. CAD - he remains active and denies angina.  Kysa Calais,MD 

## 2022-06-03 NOTE — Progress Notes (Signed)
HPI [name] is referred for consideration for ICD insertion by Dr. Tamala Julian. He is a pleasant 57 yo man with CAD, s/p PCI of the LAD and RCA with old anteroseptal MI and known aneurysm. He has a narrow QRS. He has been on maximal guideline directed medical therapy. He notes an episode of near syncope associated with hypotension. He has not had angina and remains active selling mattresses.  Allergies  Allergen Reactions   Cymbalta [Duloxetine Hcl] Other (See Comments)    Headaches   Infliximab     Other Reaction(s): Unknown   Aspirin Hives and Rash    Other Reaction(s): Unknown   Keflex [Cephalexin] Nausea And Vomiting    GI upset     Current Outpatient Medications  Medication Sig Dispense Refill   acetaminophen (TYLENOL) 325 MG tablet Take 2 tablets (650 mg total) by mouth every 4 (four) hours as needed for headache or mild pain.     alendronate (FOSAMAX) 70 MG tablet Take 70 mg by mouth every Saturday.   11   amitriptyline (ELAVIL) 25 MG tablet Take 1 tablet (25 mg total) by mouth at bedtime as needed for sleep. 90 tablet 3   aspirin EC 81 MG tablet Take 81 mg by mouth daily.     atorvastatin (LIPITOR) 80 MG tablet TAKE 1 TABLET BY MOUTH DAILY AT 6 PM. 90 tablet 3   azaTHIOprine (IMURAN) 50 MG tablet TAKE 3 TABLETS BY MOUTH AT BEDTIME. 270 tablet 0   Calcium Carb-Cholecalciferol (CALCIUM + VITAMIN D3 PO) Take 1 tablet by mouth 2 (two) times daily.     carvedilol (COREG) 3.125 MG tablet Take 1 tablet (3.125 mg total) by mouth 2 (two) times daily. 180 tablet 3   clopidogrel (PLAVIX) 75 MG tablet TAKE 1 TABLET BY MOUTH EVERY DAY 90 tablet 3   dapagliflozin propanediol (FARXIGA) 10 MG TABS tablet Take 1 tablet (10 mg total) by mouth daily before breakfast. 30 tablet 11   diphenoxylate-atropine (LOMOTIL) 2.5-0.025 MG tablet Take 1 tablet by mouth 4 (four) times daily as needed for diarrhea or loose stools.     ezetimibe (ZETIA) 10 MG tablet TAKE 1 TABLET BY MOUTH EVERY DAY 90  tablet 3   finasteride (PROSCAR) 5 MG tablet Take 5 mg by mouth at bedtime.     icosapent Ethyl (VASCEPA) 1 g capsule TAKE 2 CAPSULES BY MOUTH 2 TIMES DAILY. 120 capsule 11   MAGNESIUM PO Take 1-2 tablets by mouth See admin instructions. Alternates taking 1 and 2 tablets at bedtime every other day     Multiple Vitamin (MULTIVITAMIN) capsule Take 2 capsules by mouth in the morning.     nitroGLYCERIN (NITROSTAT) 0.4 MG SL tablet Place 1 tablet (0.4 mg total) under the tongue every 5 (five) minutes as needed for chest pain. 25 tablet 4   pantoprazole (PROTONIX) 40 MG tablet TAKE 1 TABLET BY MOUTH EVERY DAY 90 tablet 2   predniSONE (DELTASONE) 10 MG tablet Take as directed (Currently taking 21 mg daily- decrease by 1 mg as tolerated) 100 tablet 0   sacubitril-valsartan (ENTRESTO) 24-26 MG Take 1 tablet by mouth 2 (two) times daily. 180 tablet 3   silodosin (RAPAFLO) 8 MG CAPS capsule Take 8 mg by mouth in the morning.     spironolactone (ALDACTONE) 25 MG tablet Take 0.5 tablets (12.5 mg total) by mouth every Monday, Wednesday, and Friday. 24 tablet 3   tadalafil (CIALIS) 20 MG tablet Take 1 tablet (20 mg total)  by mouth daily as needed for erectile dysfunction. 20 tablet 6   vedolizumab (ENTYVIO) 300 MG injection Inject 300 mg into the vein every 28 (twenty-eight) days.     No current facility-administered medications for this visit.   Facility-Administered Medications Ordered in Other Visits  Medication Dose Route Frequency Provider Last Rate Last Admin   ondansetron (ZOFRAN) 4 mg in sodium chloride 0.9 % 50 mL IVPB  4 mg Intravenous Q6H PRN Kristeen Miss, MD         Past Medical History:  Diagnosis Date   Acquired renal cyst of right kidney 08/31/2018   2.8cm R upper pole rec monitor with yearly imaging (09/2018)   Allergy    BPH (benign prostatic hypertrophy)    CAD (coronary artery disease)    a. anterior STEMI 01/2018 -  proximal occlusion of LAD, treated with DES. Cath also showed 20%  distal LM, 95% ostial-prox small-moderate ramus, 70-80% ostial Cx, 70% dominant ostial OM, 50-60% prox Cx, 70% RCA. EF 35% by cath with LVEDP 37mHg. Med rx for residual disease. Course complicated by post MI pericarditis and LV thrombus.   Chronic systolic (congestive) heart failure (HCC)    Chronic systolic HF (heart failure) ([facility] 08/07/2021   Colitis    Colon polyp    inflammatory   Crohn disease ([facility] 1992   history uveitis, involvement of intestines and lungs   Diverticulosis    Eosinophilic granuloma ([facility]    Essential hypertension    GERD (gastroesophageal reflux disease)    GI bleeding    History of chicken pox    History of gastroesophageal reflux (GERD)    History of seizure 1995   grand mal x1, completed 6 yrs dilantin. no seizures since   Hyperlipidemia    Internal hemorrhoids    Ischemic cardiomyopathy    LV (left ventricular) mural thrombus following MI ([facility] 01/2018   Myocardial infarction ([facility]    Osteoporosis 11/2015   DEXA T -2.9   Schwannoma 2007   L axilla s/p surgery   Seizures ([facility]    last seizure 1995 and only x 1 seizure-    Ulnar neuropathy    h/o this from L arm schwannoma    ROS:   All systems reviewed and negative except as noted in the HPI.   Past Surgical History:  Procedure Laterality Date   APPENDECTOMY  2000   BACK SURGERY  2011   lumbar   CARDIAC CATHETERIZATION     CARDIOVASCULAR STRESS TEST  01/2015   low risk study   CHOLECYSTECTOMY  2000   COLONOSCOPY  08/2014   f/u crohn's, 4 inflammatory polyps, diverticulosis, improved, rpt 2 yrs ([place])   COLONOSCOPY  11/2018   multiple polyps, 1 TA, rpt 2 yrs ([place])   COLONOSCOPY WITH PROPOFOL N/A 04/09/2018   inflammatory polyp, f/u left to primary GI - [place] ([name] HKirke Corin MD)   CORONARY ANGIOGRAPHY N/A 02/21/2018   Procedure: CORONARY ANGIOGRAPHY;  Surgeon: SBelva Crome MD;  [facility];  Service: Cardiovascular;  Laterality: N/A;   CORONARY STENT  INTERVENTION N/A 08/06/2021   Procedure: CORONARY STENT INTERVENTION;  Surgeon: SBelva Crome MD;  [facility];  Service: Cardiovascular;  Laterality: N/A;   CORONARY/GRAFT ACUTE MI REVASCULARIZATION N/A 02/20/2018   Procedure: Coronary/Graft Acute MI Revascularization;  Surgeon: SBelva Crome MD;  [place];  Service: Cardiovascular;  Laterality: N/A;   ESOPHAGOGASTRODUODENOSCOPY  07/2014   h/o EE resolved, focal  reflux esophagitis, chronic active gastritis   ESOPHAGOGASTRODUODENOSCOPY  11/2018   gastritis, no active crohn's ([place])   extremity surgery Left    ulnar nerve repair after schwannoma removal   FLEXIBLE SIGMOIDOSCOPY N/A 04/14/2018   Procedure: FLEXIBLE SIGMOIDOSCOPY;  Surgeon: Milus Banister, MD;  [facility] END[facility]COPY;  Service: Endoscopy;  Laterality: N/A;   INGUINAL HERNIA REPAIR Bilateral 2017   LAMINECTOMY N/A 08/09/2018   Procedure: Lumbar three Laminectomy, excision of intradural tumor;  Surgeon: Kristeen Miss, MD;  Location: [place];  Service: Neurosurgery;  Laterality: N/A;   LEFT HEART CATH AND CORONARY ANGIOGRAPHY N/A 02/20/2018   Procedure: LEFT HEART CATH AND CORONARY ANGIOGRAPHY;  Surgeon: Belva Crome, MD;  Location: [place] [facility] LAB;  Service: Cardiovascular;  Laterality: N/A;   LEFT HEART CATH AND CORONARY ANGIOGRAPHY N/A 08/06/2021   Procedure: LEFT HEART CATH AND CORONARY ANGIOGRAPHY;  Surgeon: Belva Crome, MD;  Location: [place] CV LAB;  Service: Cardiovascular;  Laterality: N/A;   LUMBAR SPINE SURGERY  2011   POLYPECTOMY  04/09/2018   Procedure: POLYPECTOMY;  Surgeon: Doran Stabler, MD;  Location: [facility] ENDOSCOPY;  Service: Gastroenterology;;   SPINAL CORD STIMULATOR IMPLANT  2012   x2 (Dr Berton Mount)   [place] INJECTION  04/09/2018   Procedure: SUBMUCOSAL INJECTION;  Surgeon: Doran Stabler, MD;  Location: [place];  Service: Gastroenterology;;   TONSILLECTOMY  1996   TUMOR REMOVAL Left 2007    schwannoma from L armpit   UPPER GASTROINTESTINAL ENDOSCOPY       Family History  Problem Relation Age of Onset   CAD Mother 71       CABG   Congenital heart disease Mother    Hypertension Mother    Hyperlipidemia Mother    CAD Father 32       CABG   Hypertension Father    Hyperlipidemia Father    Colon cancer Maternal Grandmother    Crohn's disease Brother    Diabetes Neg Hx    Colon polyps Neg Hx    Esophageal cancer Neg Hx    Rectal cancer Neg Hx    Stomach cancer Neg Hx      Social History   Socioeconomic History   Marital status: Married    Spouse name: Mickel Baas   Number of children: 2   Years of education: 16   Highest education level: Bachelor's degree (e.g., BA, AB, BS)  Occupational History   Occupation: Self-employed    Comment: Patient owns 2 mattress stores  Tobacco Use   Smoking status: Some Days    Types: Cigars    Passive exposure: Never   Smokeless tobacco: Never  Vaping Use   Vaping Use: Never used  Substance and Sexual Activity   Alcohol use: Yes    Comment: Occasional   Drug use: No   Sexual activity: Not on file  Other Topics Concern   Not on file  Social History Narrative   Lives with wife, 1 dog. Grown children   Edu: college   Occ: owns mattress store   Activity: active at work, started [employer], wants to restart running   Diet: good water, fruits/vegetables daily   Social Determinants of Health   Financial Resource Strain: Low Risk  (05/11/2018)   Overall Financial Resource Strain (CARDIA)    Difficulty of Paying Living Expenses: Not hard at all  Food Insecurity: No Food Insecurity (02/13/2022)   Hunger Vital Sign    Worried About Charity fundraiser in  the Last Year: Never true    [place] in the Last Year: Never true  Transportation Needs: No Transportation Needs (02/13/2022)   PRAPARE - Hydrologist (Medical): No    Lack of Transportation (Non-Medical): No  Physical Activity: Inactive  (05/11/2018)   Exercise Vital Sign    Days of Exercise per Week: 0 days    Minutes of Exercise per Session: 0 min  Stress: No Stress Concern Present (05/11/2018)   [place]    Feeling of Stress : Only a little  Social Connections: Not on file  Intimate Partner Violence: Not At Risk (02/13/2022)   Humiliation, Afraid, Rape, and Kick questionnaire    Fear of Current or Ex-Partner: No    Emotionally Abused: No    Physically Abused: No    Sexually Abused: No     BP 118/60   Pulse 63   Ht '5\' 7"'$  (1.702 m)   Wt 171 lb 6.4 oz (77.7 kg)   SpO2 97%   BMI 26.85 kg/m   Physical Exam:  Well appearing NAD HEENT: Unremarkable Neck:  No JVD, no thyromegally Lymphatics:  No adenopathy Back:  No CVA tenderness Lungs:  Clear HEART:  Regular rate rhythm, no murmurs, no rubs, no clicks Abd:  soft, positive bowel sounds, no organomegally, no rebound, no guarding Ext:  2 plus pulses, no edema, no cyanosis, no clubbing Skin:  No rashes no nodules Neuro:  CN II through XII intact, motor grossly intact  EKG - reviewed. NSR with ASMI   Assess/Plan: ICM/chronic systolic heart failure - the patient is class 2 and has an EF of 30% with an ICM. I have discussed the indications/risks/benefits/goals/expectations of ICD insertion and he wishes to proceed. CAD - he remains active and denies angina.  Carleene Overlie Shalva Rozycki,MD

## 2022-06-04 DIAGNOSIS — R351 Nocturia: Secondary | ICD-10-CM | POA: Diagnosis not present

## 2022-06-04 DIAGNOSIS — C61 Malignant neoplasm of prostate: Secondary | ICD-10-CM | POA: Diagnosis not present

## 2022-06-04 DIAGNOSIS — N401 Enlarged prostate with lower urinary tract symptoms: Secondary | ICD-10-CM | POA: Diagnosis not present

## 2022-06-04 DIAGNOSIS — R3912 Poor urinary stream: Secondary | ICD-10-CM | POA: Diagnosis not present

## 2022-06-04 LAB — CBC WITH DIFFERENTIAL/PLATELET
Basophils Absolute: 0.1 10*3/uL (ref 0.0–0.2)
Basos: 1 %
EOS (ABSOLUTE): 0.4 10*3/uL (ref 0.0–0.4)
Eos: 3 %
Hematocrit: 43.5 % (ref 37.5–51.0)
Hemoglobin: 13.8 g/dL (ref 13.0–17.7)
Immature Grans (Abs): 0.1 10*3/uL (ref 0.0–0.1)
Immature Granulocytes: 1 %
Lymphocytes Absolute: 1.6 10*3/uL (ref 0.7–3.1)
Lymphs: 12 %
MCH: 28.8 pg (ref 26.6–33.0)
MCHC: 31.7 g/dL (ref 31.5–35.7)
MCV: 91 fL (ref 79–97)
Monocytes Absolute: 1.3 10*3/uL — ABNORMAL HIGH (ref 0.1–0.9)
Monocytes: 9 %
Neutrophils Absolute: 9.9 10*3/uL — ABNORMAL HIGH (ref 1.4–7.0)
Neutrophils: 74 %
Platelets: 340 10*3/uL (ref 150–450)
RBC: 4.8 x10E6/uL (ref 4.14–5.80)
RDW: 14.2 % (ref 11.6–15.4)
WBC: 13.4 10*3/uL — ABNORMAL HIGH (ref 3.4–10.8)

## 2022-06-04 LAB — BASIC METABOLIC PANEL
BUN/Creatinine Ratio: 20 (ref 9–20)
BUN: 23 mg/dL (ref 6–24)
CO2: 25 mmol/L (ref 20–29)
Calcium: 9.5 mg/dL (ref 8.7–10.2)
Chloride: 101 mmol/L (ref 96–106)
Creatinine, Ser: 1.14 mg/dL (ref 0.76–1.27)
Glucose: 85 mg/dL (ref 70–99)
Potassium: 4.1 mmol/L (ref 3.5–5.2)
Sodium: 140 mmol/L (ref 134–144)
eGFR: 76 mL/min/{1.73_m2} (ref >59)

## 2022-06-06 ENCOUNTER — Other Ambulatory Visit: Payer: Self-pay | Admitting: *Deleted

## 2022-06-06 MED ORDER — DAPAGLIFLOZIN PROPANEDIOL 10 MG PO TABS
10.0000 mg | ORAL_TABLET | Freq: Every day | ORAL | 11 refills | Status: DC
  Filled 2022-11-13: qty 30, 30d supply, fill #0
  Filled 2022-12-10: qty 30, 30d supply, fill #1
  Filled 2023-01-06: qty 30, 30d supply, fill #2
  Filled 2023-02-05: qty 30, 30d supply, fill #3
  Filled 2023-03-07: qty 30, 30d supply, fill #4
  Filled 2023-04-06: qty 30, 30d supply, fill #5

## 2022-06-09 ENCOUNTER — Encounter: Payer: Self-pay | Admitting: Family Medicine

## 2022-06-09 DIAGNOSIS — Z7952 Long term (current) use of systemic steroids: Secondary | ICD-10-CM | POA: Insufficient documentation

## 2022-06-09 DIAGNOSIS — K5090 Crohn's disease, unspecified, without complications: Secondary | ICD-10-CM | POA: Diagnosis not present

## 2022-06-17 ENCOUNTER — Telehealth: Payer: Self-pay | Admitting: Internal Medicine

## 2022-06-17 NOTE — Telephone Encounter (Signed)
Wife states patient is scheduled for surgery on 3/25 and wants to know when patient should stop his Plavix.

## 2022-06-18 NOTE — Telephone Encounter (Signed)
Dr. Lovena Le,   Patient is scheduled for ICD placement on 06/23/2022. Did you request he hold Plavix prior. He is s/p PCI May 2023 on DAPT.   Please route your response to [facility] Preop. I will communicate with requesting office once you have given recommendations.   Thank you!  Mayra Reel, NP

## 2022-06-20 NOTE — Pre-Procedure Instructions (Signed)
Attempted to call patient on the following items: Arrival time 0530 Nothing to eat or drink after midnight No meds AM of procedure Responsible person to drive you home and stay with you for 24 hrs Wash with special soap night before and morning of procedure

## 2022-06-23 ENCOUNTER — Other Ambulatory Visit: Payer: Self-pay

## 2022-06-23 ENCOUNTER — Ambulatory Visit (HOSPITAL_COMMUNITY)
Admission: RE | Admit: 2022-06-23 | Discharge: 2022-06-23 | Disposition: A | Discharge status: Home / Self Care | Payer: BC Managed Care – PPO | Attending: Internal Medicine | Admitting: Internal Medicine

## 2022-06-23 ENCOUNTER — Ambulatory Visit (HOSPITAL_COMMUNITY): Payer: BC Managed Care – PPO

## 2022-06-23 ENCOUNTER — Encounter (HOSPITAL_COMMUNITY)
Admission: RE | Disposition: A | Discharge status: Home / Self Care | Payer: Self-pay | Source: Home / Self Care | Attending: Internal Medicine

## 2022-06-23 DIAGNOSIS — I5022 Chronic systolic (congestive) heart failure: Secondary | ICD-10-CM | POA: Diagnosis not present

## 2022-06-23 DIAGNOSIS — I252 Old myocardial infarction: Secondary | ICD-10-CM | POA: Diagnosis not present

## 2022-06-23 DIAGNOSIS — I2510 Atherosclerotic heart disease of native coronary artery without angina pectoris: Secondary | ICD-10-CM | POA: Insufficient documentation

## 2022-06-23 DIAGNOSIS — F17290 Nicotine dependence, other tobacco product, uncomplicated: Secondary | ICD-10-CM | POA: Diagnosis not present

## 2022-06-23 DIAGNOSIS — I255 Ischemic cardiomyopathy: Secondary | ICD-10-CM | POA: Diagnosis not present

## 2022-06-23 DIAGNOSIS — I110 Hypertensive heart disease with heart failure: Secondary | ICD-10-CM | POA: Diagnosis not present

## 2022-06-23 DIAGNOSIS — Z950 Presence of cardiac pacemaker: Secondary | ICD-10-CM | POA: Diagnosis not present

## 2022-06-23 DIAGNOSIS — Z955 Presence of coronary angioplasty implant and graft: Secondary | ICD-10-CM | POA: Diagnosis not present

## 2022-06-23 HISTORY — PX: ICD IMPLANT: EP1208

## 2022-06-23 SURGERY — ICD IMPLANT

## 2022-06-23 MED ORDER — FENTANYL CITRATE (PF) 100 MCG/2ML IJ SOLN
INTRAMUSCULAR | Status: AC
  Filled 2022-06-23: qty 2

## 2022-06-23 MED ORDER — FENTANYL CITRATE (PF) 100 MCG/2ML IJ SOLN
INTRAMUSCULAR | Status: DC | PRN
  Administered 2022-06-23: 12.5 ug via INTRAVENOUS
  Administered 2022-06-23: 25 ug via INTRAVENOUS
  Administered 2022-06-23: 12.5 ug via INTRAVENOUS

## 2022-06-23 MED ORDER — HEPARIN (PORCINE) IN NACL 1000-0.9 UT/500ML-% IV SOLN
INTRAVENOUS | Status: DC | PRN
  Administered 2022-06-23: 500 mL

## 2022-06-23 MED ORDER — MIDAZOLAM HCL 5 MG/5ML IJ SOLN
INTRAMUSCULAR | Status: AC
  Filled 2022-06-23: qty 5

## 2022-06-23 MED ORDER — MIDAZOLAM HCL 5 MG/5ML IJ SOLN
INTRAMUSCULAR | Status: DC | PRN
  Administered 2022-06-23 (×2): 1 mg via INTRAVENOUS
  Administered 2022-06-23: 2 mg via INTRAVENOUS

## 2022-06-23 MED ORDER — SODIUM CHLORIDE 0.9 % IV SOLN: INTRAVENOUS | Status: DC

## 2022-06-23 MED ORDER — SODIUM CHLORIDE 0.9 % IV SOLN
INTRAVENOUS | Status: AC
  Filled 2022-06-23: qty 2

## 2022-06-23 MED ORDER — ACETAMINOPHEN 325 MG PO TABS: 325.0000 mg | ORAL_TABLET | ORAL | Status: DC | PRN

## 2022-06-23 MED ORDER — SODIUM CHLORIDE 0.9 % IV SOLN
80.0000 mg | INTRAVENOUS | Status: AC
  Administered 2022-06-23: 80 mg

## 2022-06-23 MED ORDER — ONDANSETRON HCL 4 MG/2ML IJ SOLN: 4.0000 mg | Freq: Four times a day (QID) | INTRAMUSCULAR | Status: DC | PRN

## 2022-06-23 MED ORDER — LIDOCAINE HCL (PF) 1 % IJ SOLN
INTRAMUSCULAR | Status: AC
  Filled 2022-06-23: qty 60

## 2022-06-23 MED ORDER — VANCOMYCIN HCL IN DEXTROSE 1-5 GM/200ML-% IV SOLN
1000.0000 mg | INTRAVENOUS | Status: AC
  Administered 2022-06-23: 1000 mg via INTRAVENOUS

## 2022-06-23 MED ORDER — VANCOMYCIN HCL IN DEXTROSE 1-5 GM/200ML-% IV SOLN
INTRAVENOUS | Status: AC
  Filled 2022-06-23: qty 200

## 2022-06-23 MED ORDER — LIDOCAINE HCL (PF) 1 % IJ SOLN
INTRAMUSCULAR | Status: DC | PRN
  Administered 2022-06-23: 50 mL

## 2022-06-23 MED ORDER — CHLORHEXIDINE GLUCONATE 4 % EX LIQD: 4.0000 | Freq: Once | CUTANEOUS | Status: DC

## 2022-06-23 MED ORDER — POVIDONE-IODINE 10 % EX SWAB
2.0000 | Freq: Once | CUTANEOUS | Status: AC
  Administered 2022-06-23: 2 via TOPICAL

## 2022-06-23 SURGICAL SUPPLY — 6 items
CABLE SURGICAL S-101-97-12 (CABLE) ×1 IMPLANT
ICD COBALT XT VR DVPA2D1 (ICD Generator) IMPLANT
LEAD SPRINT QUAT SEC 6935-65CM (Lead) IMPLANT
PAD DEFIB RADIO PHYSIO CONN (PAD) ×1 IMPLANT
SHEATH 9FR PRELUDE SNAP 13 (SHEATH) IMPLANT
TRAY PACEMAKER INSERTION (PACKS) ×1 IMPLANT

## 2022-06-23 NOTE — Discharge Instructions (Addendum)
After Your ICD (Implantable Cardiac Defibrillator)   You have a Medtronic ICD  ACTIVITY Do not lift your arm above shoulder height for 1 week after your procedure. After 7 days, you may progress as below.  You should remove your sling 24 hours after your procedure, unless otherwise instructed by your provider.     Monday June 30, 2022  Tuesday July 01, 2022 Wednesday July 02, 2022 Thursday July 03, 2022   Do not lift, push, pull, or carry anything over 10 pounds with the affected arm until 6 weeks (Monday Aug 04, 2022 ) after your procedure.   You may drive AFTER your wound check, unless you have been told otherwise by your provider.   Ask your healthcare provider when you can go back to work   INCISION/Dressin If you are on a blood thinner such as Coumadin, Xarelto, Eliquis, Plavix, or Pradaxa please confirm with your provider when this should be resumed. 06/27/2022  If large square, outer bandage is left in place, this can be removed after 24 hours from your procedure. Do not remove steri-strips or glue as below.   Monitor your defibrillator site for redness, swelling, and drainage. Call the device clinic at [phone] if you experience these symptoms or fever/chills.  If your incision is sealed with Steri-strips or staples, you may shower 7 days after your procedure or when told by your provider. Do not remove the steri-strips or let the shower hit directly on your site. You may wash around your site with soap and water.    If you were discharged in a sling, please do not wear this during the day more than 48 hours after your surgery unless otherwise instructed. This may increase the risk of stiffness and soreness in your shoulder.   Avoid lotions, ointments, or perfumes over your incision until it is well-healed.  You may use a hot tub or a pool AFTER your wound check appointment if the incision is completely closed.  Your ICD is designed to protect you from life threatening  heart rhythms. Because of this, you may receive a shock.   1 shock with no symptoms:  Call the office during business hours. 1 shock with symptoms (chest pain, chest pressure, dizziness, lightheadedness, shortness of breath, overall feeling unwell):  Call 911. If you experience 2 or more shocks in 24 hours:  Call 911. If you receive a shock, you should not drive for 6 months per the [place] DMV IF you receive appropriate therapy from your ICD.   ICD Alerts:  Some alerts are vibratory and others beep. These are NOT emergencies. Please call our office to let us know. If this occurs at night or on weekends, it can wait until the next business day. Send a remote transmission.  If your device is capable of reading fluid status (for heart failure), you will be offered monthly monitoring to review this with you.   DEVICE MANAGEMENT Remote monitoring is used to monitor your ICD from home. This monitoring is scheduled every 91 days by our office. It allows [place] to keep an eye on the functioning of your device to ensure it is working properly. You will routinely see your Electrophysiologist annually (more often if necessary).   You should receive your ID card for your new device in 4-8 weeks. Keep this card with you at all times once received. Consider wearing a medical alert bracelet or necklace.  Your ICD  may be MRI compatible. This will be discussed at your next  office visit/wound check.  You should avoid contact with strong electric or magnetic fields.   Do not use amateur (ham) radio equipment or electric (arc) welding torches. MP3 player headphones with magnets should not be used. Some devices are safe to use if held at least 12 inches (30 cm) from your defibrillator. These include power tools, lawn mowers, and speakers. If you are unsure if something is safe to use, ask your health care provider.  When using your cell phone, hold it to the ear that is on the opposite side from the defibrillator. Do not  leave your cell phone in a pocket over the defibrillator.  You may safely use electric blankets, heating pads, computers, and microwave ovens.  Call the office right away if: You have chest pain. You feel more than one shock. You feel more short of breath than you have felt before. You feel more light-headed than you have felt before. Your incision starts to open up.  This information is not intended to replace advice given to you by your health care provider. Make sure you discuss any questions you have with your health care provider.

## 2022-06-23 NOTE — Progress Notes (Signed)
Dr Taylor in to see client 

## 2022-06-23 NOTE — Progress Notes (Signed)
Dr. Lovena Le stated that it was okay to let patient discharge since his chest xray was normal.

## 2022-06-23 NOTE — Interval H&P Note (Signed)
History and Physical Interval Note:  06/23/2022 7:10 AM  Jack [name]  has presented today for surgery, with the diagnosis of heart failure.  The various methods of treatment have been discussed with the patient and family. After consideration of risks, benefits and other options for treatment, the patient has consented to  Procedure(s): ICD IMPLANT (N/A) as a surgical intervention.  The patient's history has been reviewed, patient examined, no change in status, stable for surgery.  I have reviewed the patient's chart and labs.  Questions were answered to the patient's satisfaction.     [name]

## 2022-06-23 NOTE — Interval H&P Note (Signed)
History and Physical Interval Note:  06/23/2022 7:09 AM  Jack [name]  has presented today for surgery, with the diagnosis of heart failure.  The various methods of treatment have been discussed with the patient and family. After consideration of risks, benefits and other options for treatment, the patient has consented to  Procedure(s): ICD IMPLANT (N/A) as a surgical intervention.  The patient's history has been reviewed, patient examined, no change in status, stable for surgery.  I have reviewed the patient's chart and labs.  Questions were answered to the patient's satisfaction.     Jack [name]

## 2022-06-24 ENCOUNTER — Encounter (HOSPITAL_COMMUNITY): Payer: Self-pay | Admitting: Internal Medicine

## 2022-06-30 ENCOUNTER — Other Ambulatory Visit: Payer: Self-pay

## 2022-06-30 ENCOUNTER — Other Ambulatory Visit: Payer: Self-pay | Admitting: Physician Assistant

## 2022-06-30 MED ORDER — CLOPIDOGREL BISULFATE 75 MG PO TABS
75.0000 mg | ORAL_TABLET | Freq: Every day | ORAL | 3 refills | Status: DC
  Filled 2022-11-13: qty 90, 90d supply, fill #0
  Filled 2023-02-05: qty 90, 90d supply, fill #1

## 2022-07-03 ENCOUNTER — Ambulatory Visit: Payer: BC Managed Care – PPO | Attending: Cardiology

## 2022-07-03 DIAGNOSIS — I5022 Chronic systolic (congestive) heart failure: Secondary | ICD-10-CM | POA: Diagnosis not present

## 2022-07-03 LAB — CUP PACEART INCLINIC DEVICE CHECK
Battery Remaining Longevity: 168 mo
Brady Statistic RV Percent Paced: 0.1 % — CL
Date Time Interrogation Session: 20240404172406
HighPow Impedance: 68 Ohm
Implantable Lead Connection Status: 753985
Implantable Lead Implant Date: 20240325
Implantable Lead Location: 753860
Implantable Lead Model: 6935
Implantable Pulse Generator Implant Date: 20240325
Lead Channel Impedance Value: 494 Ohm
Lead Channel Pacing Threshold Amplitude: 0.75 V
Lead Channel Pacing Threshold Amplitude: 1.2 V
Lead Channel Pacing Threshold Pulse Width: 0.4 ms
Lead Channel Pacing Threshold Pulse Width: 0.5 ms
Lead Channel Sensing Intrinsic Amplitude: 18.8 mV

## 2022-07-03 NOTE — Patient Instructions (Signed)

## 2022-07-03 NOTE — Progress Notes (Signed)
Wound check appointment. Steri-strips removed. Wound without redness or edema. Incision edges approximated, wound well healed. Normal device function. Thresholds, sensing, and impedances consistent with implant measurements. Device programmed at 3.5V for extra safety margin until 3 month visit. Histogram distribution appropriate for patient and level of activity. No mode switches or ventricular arrhythmias noted. Patient educated about wound care, arm mobility, lifting restrictions, shock plan and auditory alerts demonstrated. ROV in 3 months with implanting physician.

## 2022-07-07 ENCOUNTER — Other Ambulatory Visit: Payer: Self-pay | Admitting: *Deleted

## 2022-07-07 DIAGNOSIS — K5090 Crohn's disease, unspecified, without complications: Secondary | ICD-10-CM | POA: Diagnosis not present

## 2022-07-07 MED ORDER — ICOSAPENT ETHYL 1 G PO CAPS
2.0000 g | ORAL_CAPSULE | Freq: Two times a day (BID) | ORAL | 11 refills | Status: DC
  Filled 2022-11-13: qty 120, 30d supply, fill #0
  Filled 2022-12-10: qty 120, 30d supply, fill #1
  Filled 2023-01-06: qty 120, 30d supply, fill #2
  Filled 2023-02-05: qty 120, 30d supply, fill #3
  Filled 2023-03-07: qty 120, 30d supply, fill #4
  Filled 2023-04-06: qty 120, 30d supply, fill #5
  Filled 2023-05-09: qty 120, 30d supply, fill #6
  Filled 2023-06-08 – 2023-06-22 (×2): qty 120, 30d supply, fill #7

## 2022-07-23 ENCOUNTER — Other Ambulatory Visit: Payer: Self-pay

## 2022-07-23 MED ORDER — ATORVASTATIN CALCIUM 80 MG PO TABS
ORAL_TABLET | ORAL | 3 refills | Status: DC
  Filled 2023-01-12: qty 90, 90d supply, fill #0
  Filled 2023-04-06: qty 90, 90d supply, fill #1

## 2022-07-23 NOTE — Telephone Encounter (Signed)
Pt's medication was sent to pt's pharmacy as requested. Confirmation received.  °

## 2022-08-04 DIAGNOSIS — K5090 Crohn's disease, unspecified, without complications: Secondary | ICD-10-CM | POA: Diagnosis not present

## 2022-08-06 LAB — LAB REPORT - SCANNED: EGFR: 78

## 2022-08-08 ENCOUNTER — Telehealth: Payer: Self-pay | Admitting: Pharmacy Technician

## 2022-08-08 NOTE — Telephone Encounter (Signed)
Patient Advocate Encounter   Received notification that prior authorization for Pantoprazole Sodium 40MG  dr tablets is required.   PA submitted on 08/08/2022 Key [id number] Insurance [facility] Commercial Electronic Request Form Status is pending

## 2022-08-20 ENCOUNTER — Telehealth: Payer: Self-pay | Admitting: Interventional Cardiology

## 2022-08-20 MED ORDER — ENTRESTO 24-26 MG PO TABS
1.0000 | ORAL_TABLET | Freq: Two times a day (BID) | ORAL | 3 refills | Status: DC
  Filled 2022-11-13: qty 180, 90d supply, fill #0
  Filled 2023-02-09: qty 180, 90d supply, fill #1
  Filled 2023-05-11: qty 180, 90d supply, fill #2

## 2022-08-20 NOTE — Telephone Encounter (Signed)
*  STAT* If patient is at the pharmacy, call can be transferred to refill team.   1. Which medications need to be refilled? (please list name of each medication and dose if known)  sacubitril-valsartan (ENTRESTO) 24-26 MG   2. Which pharmacy/location (including street and city if local pharmacy) is medication to be sent to?  [id number] [address], [place] [phone]    3. Do they need a 30 day or 90 day supply? 90 day    Patient is out of meds.

## 2022-08-20 NOTE — Telephone Encounter (Signed)
Pt's medication was sent to pt's pharmacy as requested. Confirmation received.  °

## 2022-08-23 ENCOUNTER — Other Ambulatory Visit (HOSPITAL_COMMUNITY): Payer: Self-pay

## 2022-08-23 ENCOUNTER — Other Ambulatory Visit: Payer: Self-pay | Admitting: Internal Medicine

## 2022-08-23 NOTE — Telephone Encounter (Signed)
Pharmacy Patient Advocate Encounter  Prior Authorization for Pantoprazole Sodium 40MG  has been approved by [employer] (ins).    PA # [id number] Effective dates: 08/10/2022 through 08/08/2023  Spoke with Pharmacy to process. Copay is $0.00  Georga Bora Rx Patient Advocate [phone]

## 2022-09-01 DIAGNOSIS — K5090 Crohn's disease, unspecified, without complications: Secondary | ICD-10-CM | POA: Diagnosis not present

## 2022-09-03 ENCOUNTER — Other Ambulatory Visit: Payer: Self-pay | Admitting: Internal Medicine

## 2022-09-05 ENCOUNTER — Other Ambulatory Visit: Payer: Self-pay | Admitting: *Deleted

## 2022-09-05 MED ORDER — AZATHIOPRINE 50 MG PO TABS: 150.0000 mg | ORAL_TABLET | Freq: Every day | ORAL | 0 refills | Status: DC

## 2022-09-22 ENCOUNTER — Ambulatory Visit (INDEPENDENT_AMBULATORY_CARE_PROVIDER_SITE_OTHER): Payer: BC Managed Care – PPO

## 2022-09-22 DIAGNOSIS — I5022 Chronic systolic (congestive) heart failure: Secondary | ICD-10-CM | POA: Diagnosis not present

## 2022-09-23 LAB — CUP PACEART REMOTE DEVICE CHECK
Battery Remaining Longevity: 168 mo
Battery Voltage: 3.1 V
Brady Statistic RA Percent Paced: INVALID
Brady Statistic RV Percent Paced: 0.01 %
Date Time Interrogation Session: 20240624043116
HighPow Impedance: 61 Ohm
Implantable Lead Connection Status: 753985
Implantable Lead Implant Date: 20240325
Implantable Lead Location: 753860
Implantable Lead Model: 6935
Implantable Pulse Generator Implant Date: 20240325
Lead Channel Impedance Value: 494 Ohm
Lead Channel Impedance Value: 589 Ohm
Lead Channel Pacing Threshold Amplitude: 0.5 V
Lead Channel Pacing Threshold Pulse Width: 0.4 ms
Lead Channel Sensing Intrinsic Amplitude: 19.9 mV
Lead Channel Setting Pacing Amplitude: 2 V
Lead Channel Setting Pacing Pulse Width: 0.4 ms
Lead Channel Setting Sensing Sensitivity: 0.3 mV
Zone Setting Status: 755011

## 2022-09-25 ENCOUNTER — Encounter: Payer: Self-pay | Admitting: Internal Medicine

## 2022-09-25 ENCOUNTER — Ambulatory Visit: Payer: BC Managed Care – PPO | Attending: Internal Medicine | Admitting: Internal Medicine

## 2022-09-25 VITALS — BP 112/64 | HR 66 | Ht 67.0 in | Wt 167.0 lb

## 2022-09-25 DIAGNOSIS — I5022 Chronic systolic (congestive) heart failure: Secondary | ICD-10-CM | POA: Diagnosis not present

## 2022-09-25 NOTE — Progress Notes (Signed)
HPI [name] returns for ongoing evaluation s/p ICD Insertion. He is a pleasant 57 yo man with CAD, s/p PCI of the LAD and RCA with old anteroseptal MI and known aneurysm. He has a narrow QRS. He has been on maximal guideline directed medical therapy. He notes an episode of near syncope associated with hypotension. He has not had angina and remains active selling mattresses. Since his ICD insertion he has done well. He went to [place].  Allergies  Allergen Reactions   Cymbalta [Duloxetine Hcl] Other (See Comments)    Headaches   Infliximab     Other Reaction(s): Unknown   Keflex [Cephalexin] Nausea And Vomiting    GI upset     Current Outpatient Medications  Medication Sig Dispense Refill   acetaminophen (TYLENOL) 325 MG tablet Take 2 tablets (650 mg total) by mouth every 4 (four) hours as needed for headache or mild pain.     alendronate (FOSAMAX) 70 MG tablet Take 70 mg by mouth every Saturday.   11   amitriptyline (ELAVIL) 25 MG tablet Take 1 tablet (25 mg total) by mouth at bedtime as needed for sleep. 90 tablet 3   aspirin EC 81 MG tablet Take 81 mg by mouth daily.     atorvastatin (LIPITOR) 80 MG tablet TAKE 1 TABLET BY MOUTH DAILY AT 6 PM. 90 tablet 3   azaTHIOprine (IMURAN) 50 MG tablet Take 3 tablets (150 mg total) by mouth at bedtime. 270 tablet 0   Calcium Carb-Cholecalciferol (CALCIUM + VITAMIN D3 PO) Take 1 tablet by mouth 2 (two) times daily.     carvedilol (COREG) 3.125 MG tablet TAKE 1 TABLET BY MOUTH 2 TIMES DAILY. 180 tablet 3   clopidogrel (PLAVIX) 75 MG tablet Take 1 tablet (75 mg total) by mouth daily. 90 tablet 3   dapagliflozin propanediol (FARXIGA) 10 MG TABS tablet Take 1 tablet (10 mg total) by mouth daily before breakfast. 30 tablet 11   diphenoxylate-atropine (LOMOTIL) 2.5-0.025 MG tablet Take 1 tablet by mouth 4 (four) times daily as needed for diarrhea or loose stools.     ezetimibe (ZETIA) 10 MG tablet TAKE 1 TABLET BY MOUTH EVERY DAY 90  tablet 3   finasteride (PROSCAR) 5 MG tablet Take 5 mg by mouth at bedtime.     icosapent Ethyl (VASCEPA) 1 g capsule TAKE 2 CAPSULES BY MOUTH 2 TIMES DAILY. 120 capsule 11   MAGNESIUM PO Take 1-2 tablets by mouth daily as needed (constipation). Alternates taking 1 and 2 tablets at bedtime every other day     nitroGLYCERIN (NITROSTAT) 0.4 MG SL tablet Place 1 tablet (0.4 mg total) under the tongue every 5 (five) minutes as needed for chest pain. 25 tablet 4   OVER THE COUNTER MEDICATION Apply 1 Application topically daily. Athlete's foot cream     pantoprazole (PROTONIX) 40 MG tablet TAKE 1 TABLET BY MOUTH EVERY DAY 90 tablet 2   predniSONE (DELTASONE) 10 MG tablet Take as directed (Currently taking 21 mg daily- decrease by 1 mg as tolerated) 100 tablet 0   sacubitril-valsartan (ENTRESTO) 24-26 MG Take 1 tablet by mouth 2 (two) times daily. 180 tablet 3   silodosin (RAPAFLO) 8 MG CAPS capsule Take 8 mg by mouth in the morning.     spironolactone (ALDACTONE) 25 MG tablet Take 0.5 tablets (12.5 mg total) by mouth every Monday, Wednesday, and Friday. 24 tablet 3   tadalafil (CIALIS) 20 MG tablet Take 1 tablet (20 mg total)  by mouth daily as needed for erectile dysfunction. 20 tablet 6   tadalafil (CIALIS) 5 MG tablet Take 5 mg by mouth daily.     vedolizumab (ENTYVIO) 300 MG injection Inject 300 mg into the vein every 28 (twenty-eight) days.     No current facility-administered medications for this visit.   Facility-Administered Medications Ordered in Other Visits  Medication Dose Route Frequency Provider Last Rate Last Admin   ondansetron (ZOFRAN) 4 mg in sodium chloride 0.9 % 50 mL IVPB  4 mg Intravenous Q6H PRN Barnett Abu, MD         Past Medical History:  Diagnosis Date   Acquired renal cyst of right kidney 08/31/2018   2.8cm R upper pole rec monitor with yearly imaging (09/2018)   Allergy    BPH (benign prostatic hypertrophy)    CAD (coronary artery disease)    a. anterior STEMI  01/2018 -  proximal occlusion of LAD, treated with DES. Cath also showed 20% distal LM, 95% ostial-prox small-moderate ramus, 70-80% ostial Cx, 70% dominant ostial OM, 50-60% prox Cx, 70% RCA. EF 35% by cath with LVEDP . Med rx for residual disease. Course complicated by post MI pericarditis and LV thrombus.   Chronic systolic (congestive) heart failure (HCC)    Chronic systolic HF (heart failure) (HCC) 08/07/2021   Colitis    Colon polyp    inflammatory   Crohn disease (HCC) 1992   history uveitis, involvement of intestines and lungs   Diverticulosis    Eosinophilic granuloma (HCC)    Essential hypertension    GERD (gastroesophageal reflux disease)    GI bleeding    History of chicken pox    History of gastroesophageal reflux (GERD)    History of seizure 1995   grand mal x1, completed 6 yrs dilantin. no seizures since   Hyperlipidemia    Internal hemorrhoids    Ischemic cardiomyopathy    LV (left ventricular) mural thrombus following MI (HCC) 01/2018   Myocardial infarction [facility])    Osteoporosis 11/2015   DEXA T -2.9   Schwannoma 2007   L axilla s/p surgery   Seizures (HCC)    last seizure 1995 and only x 1 seizure-    Ulnar neuropathy    h/o this from L arm schwannoma    ROS:   All systems reviewed and negative except as noted in the HPI.   Past Surgical History:  Procedure Laterality Date   APPENDECTOMY  2000   BACK SURGERY  2011   lumbar   CARDIAC CATHETERIZATION     CARDIOVASCULAR STRESS TEST  01/2015   low risk study   CHOLECYSTECTOMY  2000   COLONOSCOPY  08/2014   f/u crohn's, 4 inflammatory polyps, diverticulosis, improved, rpt 2 yrs ([place])   COLONOSCOPY  11/2018   multiple polyps, 1 TA, rpt 2 yrs ([place])   COLONOSCOPY WITH PROPOFOL N/A 04/09/2018   inflammatory polyp, f/u left to primary GI - [place] Myrtie Neither, Andreas Blower, MD)   CORONARY ANGIOGRAPHY N/A 02/21/2018   Procedure: CORONARY ANGIOGRAPHY;  Surgeon: Lyn Records, MD;  [facility]: [facility] INVASIVE  CV LAB;  Service: Cardiovascular;  Laterality: N/A;   CORONARY STENT INTERVENTION N/A 08/06/2021   Procedure: CORONARY STENT INTERVENTION;  Surgeon: Lyn Records, MD;  Location: [facility] INVASIVE CV LAB;  Service: Cardiovascular;  Laterality: N/A;   CORONARY/GRAFT ACUTE MI REVASCULARIZATION N/A 02/20/2018   Procedure: Coronary/Graft Acute MI Revascularization;  Surgeon: Lyn Records, MD;  Location: [facility] IN[facility] CV LAB;  Service:  Cardiovascular;  Laterality: N/A;   ESOPHAGOGASTRODUODENOSCOPY  07/2014   h/o EE resolved, focal reflux esophagitis, chronic active gastritis   ESOPHAGOGASTRODUODENOSCOPY  11/2018   gastritis, no active crohn's ([place])   extremity surgery Left    ulnar nerve repair after schwannoma removal   FLEXIBLE SIGMOIDOSCOPY N/A 04/14/2018   Procedure: FLEXIBLE SIGMOIDOSCOPY;  Surgeon: Rachael Fee, MD;  Location: [facility] ENDOSCOPY;  Service: Endoscopy;  Laterality: N/A;   ICD IMPLANT N/A 06/23/2022   Procedure: ICD IMPLANT;  Surgeon: Marinus Maw, MD;  Location: [facility] INVASIVE CV LAB;  Service: Cardiovascular;  Laterality: N/A;   INGUINAL HERNIA REPAIR Bilateral 2017   LAMINECTOMY N/A 08/09/2018   Procedure: Lumbar three Laminectomy, excision of intradural tumor;  Surgeon: Barnett Abu, MD;  Location: [facility] OR;  Service: Neurosurgery;  Laterality: N/A;   LEFT HEART CATH AND CORONARY ANGIOGRAPHY N/A 02/20/2018   Procedure: LEFT HEART CATH AND CORONARY ANGIOGRAPHY;  Surgeon: Lyn Records, MD;  [facility];  Service: Cardiovascular;  Laterality: N/A;   LEFT HEART CATH AND CORONARY ANGIOGRAPHY N/A 08/06/2021   Procedure: LEFT HEART CATH AND CORONARY ANGIOGRAPHY;  Surgeon: Lyn Records, MD;  [facility];  Service: Cardiovascular;  Laterality: N/A;   LUMBAR SPINE SURGERY  2011   POLYPECTOMY  04/09/2018   Procedure: POLYPECTOMY;  Surgeon: Sherrilyn Rist, MD;  Location: [facility] ENDOSCOPY;  Service: Gastroenterology;;   SPINAL CORD STIMULATOR IMPLANT  2012   x2  (Dr Gretta Arab)   SUBMUCOSAL INJECTION  04/09/2018   Procedure: SUBMUCOSAL INJECTION;  Surgeon: Sherrilyn Rist, MD;  Location: [facility] ENDOSCOPY;  Service: Gastroenterology;;   TONSILLECTOMY  1996   TUMOR REMOVAL Left 2007   schwannoma from L armpit   UPPER GASTROINTESTINAL ENDOSCOPY       Family History  Problem Relation Age of Onset   CAD Mother 69       CABG   Congenital heart disease Mother    Hypertension Mother    Hyperlipidemia Mother    CAD Father 91       CABG   Hypertension Father    Hyperlipidemia Father    Colon cancer Maternal Grandmother    Crohn's disease Brother    Diabetes Neg Hx    Colon polyps Neg Hx    Esophageal cancer Neg Hx    Rectal cancer Neg Hx    Stomach cancer Neg Hx      Social History   Socioeconomic History   Marital status: Married    Spouse name: Jack [name]   Number of children: 2   Years of education: 16   Highest education level: Bachelor's degree (e.g., BA, AB, BS)  Occupational History   Occupation: Self-employed    Comment: Patient owns 2 mattress stores  Tobacco Use   Smoking status: Some Days    Types: Cigars    Passive exposure: Never   Smokeless tobacco: Never  Vaping Use   Vaping Use: Never used  Substance and Sexual Activity   Alcohol use: Yes    Comment: Occasional   Drug use: No   Sexual activity: Not on file  Other Topics Concern   Not on file  Social History Narrative   Lives with wife, 1 dog. Grown children   Edu: college   Occ: owns mattress store   Activity: active at work, started [employer], wants to restart running   Diet: good water, fruits/vegetables daily   Social Determinants of Corporate investment banker Strain: Low  Risk  (05/11/2018)   Overall Financial Resource Strain (CARDIA)    Difficulty of Paying Living Expenses: Not hard at all  Food Insecurity: No Food Insecurity (02/13/2022)   Hunger Vital Sign    Worried About Running Out of Food in the Last Year: Never true    Ran Out of Food in the Last  Year: Never true  Transportation Needs: No Transportation Needs (02/13/2022)   PRAPARE - Administrator, Civil Service (Medical): No    Lack of Transportation (Non-Medical): No  Physical Activity: Inactive (05/11/2018)   Exercise Vital Sign    Days of Exercise per Week: 0 days    Minutes of Exercise per Session: 0 min  Stress: No Stress Concern Present (05/11/2018)   [employer] of Occupational Health - Occupational Stress Questionnaire    Feeling of Stress : Only a little  Social Connections: Not on file  Intimate Partner Violence: Not At Risk (02/13/2022)   Humiliation, Afraid, Rape, and Kick questionnaire    Fear of Current or Ex-Partner: No    Emotionally Abused: No    Physically Abused: No    Sexually Abused: No     BP 112/64   Pulse 66   Ht 5\' 7"  (1.702 m)   Wt 167 lb (75.8 kg)   SpO2 98%   BMI 26.16 kg/m   Physical Exam:  Well appearing NAD HEENT: Unremarkable Neck:  No JVD, no thyromegally Lymphatics:  No adenopathy Back:  No CVA tenderness Lungs:  Clear HEART:  Regular rate rhythm, no murmurs, no rubs, no clicks Abd:  soft, positive bowel sounds, no organomegally, no rebound, no guarding Ext:  2 plus pulses, no edema, no cyanosis, no clubbing Skin:  No rashes no nodules Neuro:  CN II through XII intact, motor grossly intact  EKG - nsr with prior AS MI  DEVICE  Normal device function.  See PaceArt for details.   Assess/Plan:  ICM/chronic systolic heart failure - the patient is class 2 and has an EF of 30% with an ICM. He denies anginal symptoms. Continue current meds. CAD - he remains active and denies angina. ICD - his medtronic single chamber ICD is working normally with 14 years of est battery longevity.   Sharlot Gowda Ellinore Merced,MD

## 2022-09-25 NOTE — Patient Instructions (Signed)
Medication Instructions:  Your physician recommends that you continue on your current medications as directed. Please refer to the Current Medication list given to you today.  *If you need a refill on your cardiac medications before your next appointment, please call your pharmacy*   Lab Work: None ordered   Testing/Procedures: None ordered   Follow-Up: At [facility], you and your health needs are our priority.  As part of our continuing mission to provide you with exceptional heart care, we have created designated Provider Care Teams.  These Care Teams include your primary Cardiologist (physician) and Advanced Practice Providers (APPs -  Physician Assistants and Nurse Practitioners) who all work together to provide you with the care you need, when you need it.  Remote monitoring is used to monitor your Pacemaker or ICD from home. This monitoring reduces the number of office visits required to check your device to one time per year. It allows us to keep an eye on the functioning of your device to ensure it is working properly. You are scheduled for a device check from home on 12/22/2022. You may send your transmission at any time that day. If you have a wireless device, the transmission will be sent automatically. After your physician reviews your transmission, you will receive a postcard with your next transmission date.  Your next appointment:   1 year(s)  The format for your next appointment:   In Person  Provider:   Gregg Taylor, MD{  Thank you for choosing [facility]!!   [phone]       

## 2022-09-29 DIAGNOSIS — K5090 Crohn's disease, unspecified, without complications: Secondary | ICD-10-CM | POA: Diagnosis not present

## 2022-10-08 ENCOUNTER — Telehealth: Payer: Self-pay

## 2022-10-08 NOTE — Telephone Encounter (Signed)
[facility] Pre-operative Risk Assessment     Request for surgical clearance:     Endoscopy Procedure  What type of surgery is being performed?     Colonoscopy  When is this surgery scheduled?     Not scheduled yet, need clearance  What type of clearance is required ?   Pharmacy  Are there any medications that need to be held prior to surgery and how long? Plavix for 5 days  Practice name and name of physician performing surgery?      [facility] Dr. Erick Blinks  What is your office phone and fax number?      Phone- [phone]  Fax- [phone]  Anesthesia type (None, local, MAC, general) ?       MAC

## 2022-10-08 NOTE — Telephone Encounter (Signed)
   Patient Name: Jack [name]  DOB: 1965/06/17 MRN: [id number]  Primary Cardiologist: Lance Muss, MD  Chart reviewed as part of pre-operative protocol coverage. Pre-op clearance already addressed by colleagues in earlier phone notes. To summarize recommendations:  -Patient with history of remote stenting.  Okay to hold Plavix x 5 days prior to the procedure.  Would prefer patient be on ASA 81 mg while off Plavix.  Can discontinue once Plavix is resumed.  Will route this bundled recommendation to requesting provider via Epic fax function and remove from pre-op pool. Please call with questions.  Sharlene Dory, PA-C 10/08/2022, 4:33 PM

## 2022-10-10 NOTE — Progress Notes (Signed)
Remote ICD transmission.   

## 2022-10-10 NOTE — Telephone Encounter (Signed)
Called and offered pt appt 12/22/22 for colon at [facility]. Pts wife states pt cannot do that date as he will be in [place]. Let her know he will be left on hospital list for next opening.

## 2022-10-13 ENCOUNTER — Telehealth: Payer: Self-pay

## 2022-10-13 ENCOUNTER — Other Ambulatory Visit: Payer: Self-pay

## 2022-10-13 ENCOUNTER — Other Ambulatory Visit: Payer: Self-pay | Admitting: Internal Medicine

## 2022-10-13 DIAGNOSIS — M301 Polyarteritis with lung involvement [Churg-Strauss]: Secondary | ICD-10-CM

## 2022-10-13 DIAGNOSIS — K5080 Crohn's disease of both small and large intestine without complications: Secondary | ICD-10-CM

## 2022-10-13 DIAGNOSIS — D72119 Hypereosinophilic syndrome (hes), unspecified: Secondary | ICD-10-CM

## 2022-10-13 DIAGNOSIS — D721 Eosinophilia, unspecified: Secondary | ICD-10-CM

## 2022-10-13 NOTE — Progress Notes (Signed)
Patient receives Entyvio 300 mg every 4 weeks for Crohn's colitis He also has a history of autoimmune pneumonitis His rheumatologist Dr. Kathi Ludwig is now out of network I will change him to [facility] He is also being referred to Dr. Dimple Casey

## 2022-10-13 NOTE — Telephone Encounter (Signed)
Referral placed for Rhem to see Dr. Orest Dikes.

## 2022-10-13 NOTE — Telephone Encounter (Signed)
-----   Message from Carie Caddy Pyrtle sent at 10/13/2022  4:40 PM EDT ----- Jeff's rheumatologist is out of network. He has been seeing Dr. Kathi Ludwig for Entyvio 300 mg every 4 weeks We should change this to the [facility] Please also refer him to Dr. Orest Dikes to reestablish with an in network rheumatologist I will place order for next Entyvio infusions JMP

## 2022-10-16 ENCOUNTER — Other Ambulatory Visit: Payer: Self-pay

## 2022-10-16 DIAGNOSIS — K5080 Crohn's disease of both small and large intestine without complications: Secondary | ICD-10-CM

## 2022-10-16 NOTE — Telephone Encounter (Signed)
Pt scheduled for telephone previsit 12/29/22 at 10am, pt scheduled for colon at [facility] 01/12/23 at 10am, pt to arrive there at 8:30am. Pts wife aware of appts.

## 2022-10-21 ENCOUNTER — Emergency Department (HOSPITAL_BASED_OUTPATIENT_CLINIC_OR_DEPARTMENT_OTHER): Payer: BC Managed Care – PPO

## 2022-10-21 ENCOUNTER — Emergency Department (HOSPITAL_BASED_OUTPATIENT_CLINIC_OR_DEPARTMENT_OTHER)
Admission: EM | Admit: 2022-10-21 | Discharge: 2022-10-21 | Disposition: A | Discharge status: Home / Self Care | Payer: BC Managed Care – PPO | Attending: Emergency Medicine | Admitting: Emergency Medicine

## 2022-10-21 ENCOUNTER — Encounter (HOSPITAL_BASED_OUTPATIENT_CLINIC_OR_DEPARTMENT_OTHER): Payer: Self-pay | Admitting: Emergency Medicine

## 2022-10-21 ENCOUNTER — Other Ambulatory Visit: Payer: Self-pay

## 2022-10-21 DIAGNOSIS — H5711 Ocular pain, right eye: Secondary | ICD-10-CM | POA: Diagnosis not present

## 2022-10-21 DIAGNOSIS — Z7982 Long term (current) use of aspirin: Secondary | ICD-10-CM | POA: Diagnosis not present

## 2022-10-21 DIAGNOSIS — R519 Headache, unspecified: Secondary | ICD-10-CM | POA: Diagnosis not present

## 2022-10-21 LAB — BASIC METABOLIC PANEL
Anion gap: 9 (ref 5–15)
BUN: 25 mg/dL — ABNORMAL HIGH (ref 6–20)
CO2: 25 mmol/L (ref 22–32)
Calcium: 9.5 mg/dL (ref 8.9–10.3)
Chloride: 105 mmol/L (ref 98–111)
Creatinine, Ser: 1.21 mg/dL (ref 0.61–1.24)
GFR, Estimated: >60 mL/min (ref >60)
Glucose, Bld: 185 mg/dL — ABNORMAL HIGH (ref 70–99)
Potassium: 4 mmol/L (ref 3.5–5.1)
Sodium: 139 mmol/L (ref 135–145)

## 2022-10-21 LAB — URINALYSIS, ROUTINE W REFLEX MICROSCOPIC
Bacteria, UA: NONE SEEN
Bilirubin Urine: NEGATIVE
Glucose, UA: >1000 mg/dL — AB
Ketones, ur: NEGATIVE mg/dL
Leukocytes,Ua: NEGATIVE
Nitrite: NEGATIVE
Protein, ur: NEGATIVE mg/dL
Specific Gravity, Urine: 1.028 (ref 1.005–1.030)
pH: 5.5 (ref 5.0–8.0)

## 2022-10-21 LAB — CBC
HCT: 44.5 % (ref 39.0–52.0)
Hemoglobin: 13.9 g/dL (ref 13.0–17.0)
MCH: 28.8 pg (ref 26.0–34.0)
MCHC: 31.2 g/dL (ref 30.0–36.0)
MCV: 92.1 fL (ref 80.0–100.0)
Platelets: 263 10*3/uL (ref 150–400)
RBC: 4.83 MIL/uL (ref 4.22–5.81)
RDW: 17.4 % — ABNORMAL HIGH (ref 11.5–15.5)
WBC: 10.2 10*3/uL (ref 4.0–10.5)
nRBC: 0 % (ref 0.0–0.2)

## 2022-10-21 MED ORDER — KETOROLAC TROMETHAMINE 15 MG/ML IJ SOLN
15.0000 mg | Freq: Once | INTRAMUSCULAR | Status: AC
  Administered 2022-10-21: 15 mg via INTRAVENOUS
  Filled 2022-10-21: qty 1

## 2022-10-21 MED ORDER — SODIUM CHLORIDE 0.9 % IV BOLUS
1000.0000 mL | Freq: Once | INTRAVENOUS | Status: AC
  Administered 2022-10-21: 1000 mL via INTRAVENOUS

## 2022-10-21 MED ORDER — DIPHENHYDRAMINE HCL 50 MG/ML IJ SOLN
25.0000 mg | Freq: Once | INTRAMUSCULAR | Status: AC
  Administered 2022-10-21: 25 mg via INTRAVENOUS
  Filled 2022-10-21: qty 1

## 2022-10-21 MED ORDER — TETRACAINE HCL 0.5 % OP SOLN
1.0000 [drp] | Freq: Once | OPHTHALMIC | Status: AC
  Administered 2022-10-21: 1 [drp] via OPHTHALMIC
  Filled 2022-10-21: qty 4

## 2022-10-21 MED ORDER — METOCLOPRAMIDE HCL 5 MG/ML IJ SOLN
10.0000 mg | Freq: Once | INTRAMUSCULAR | Status: AC
  Administered 2022-10-21: 10 mg via INTRAVENOUS
  Filled 2022-10-21: qty 2

## 2022-10-21 NOTE — ED Notes (Signed)
Reviewed AVS with patient, patient expressed understanding of directions, denies further questions at this time. 

## 2022-10-21 NOTE — ED Triage Notes (Signed)
Pt arrives to ED with c/o severe headache behind right eye x3 days. The headache [place].

## 2022-10-21 NOTE — ED Notes (Signed)
Pt ambulatory to restroom with steady gait.

## 2022-10-21 NOTE — ED Notes (Signed)
Pt placed on NRB per PA [employer] order

## 2022-10-21 NOTE — ED Notes (Signed)
PA at bedside for evaluation

## 2022-10-21 NOTE — Discharge Instructions (Signed)
Please follow-up with your primary care doctor, and your eye exam was unremarkable.  I am glad that you are feeling better, your head CT was also reassuring.  I believe that you may have had a cluster headache, please make sure you are drinking lots of fluids, and resting.  Return to the ER if you have any weakness, numbness, or tingling to 1 side of your body.

## 2022-10-21 NOTE — ED Provider Notes (Signed)
[place] EMERGENCY DEPARTMENT AT [facility] Provider Note   CSN: [id number] Arrival date & time: 10/21/22  1714     History  Chief Complaint  Patient presents with   Headache    Jack GILLOTT is a 57 y.o. male, history of Crohn's disease, who presents to the ED secondary to a throbbing headache above his right eye for the last 3 days.  He states he was at the beach 3 days ago, when the headache came on, and is progressively gotten worse.  He states it is throbbing, and occasionally stabbing like pins-and-needles to his right eye.  Denies any photophobia, phonophobia, but does endorse some nausea, tearing of the right eye, as well as rhinorrhea of the right side of his nose.  Has not had any vision changes, or trauma to the area.  Denies any fevers or chills.  Has not tried to drink a lot of water because he thought he was initially dehydrated, but has not had any relief.     Home Medications Prior to Admission medications   Medication Sig Start Date End Date Taking? Authorizing Provider  acetaminophen (TYLENOL) 325 MG tablet Take 2 tablets (650 mg total) by mouth every 4 (four) hours as needed for headache or mild pain. 08/07/21   Leone Brand, NP  alendronate (FOSAMAX) 70 MG tablet Take 70 mg by mouth every Saturday.  01/11/18   [provider]  amitriptyline (ELAVIL) 25 MG tablet Take 1 tablet (25 mg total) by mouth at bedtime as needed for sleep. 01/27/22   Eustaquio Boyden, MD  aspirin EC 81 MG tablet Take 81 mg by mouth daily.    [provider]  atorvastatin (LIPITOR) 80 MG tablet TAKE 1 TABLET BY MOUTH DAILY AT 6 PM. 07/23/22   Corky Crafts, MD  azaTHIOprine (IMURAN) 50 MG tablet Take 3 tablets (150 mg total) by mouth at bedtime. 09/05/22   Pyrtle, Carie Caddy, MD  Calcium Carb-Cholecalciferol (CALCIUM + VITAMIN D3 PO) Take 1 tablet by mouth 2 (two) times daily.    [provider]  carvedilol (COREG) 3.125 MG tablet TAKE 1 TABLET BY  MOUTH 2 TIMES DAILY. 06/30/22   Marinus Maw, MD  clopidogrel (PLAVIX) 75 MG tablet Take 1 tablet (75 mg total) by mouth daily. 06/30/22   Corky Crafts, MD  dapagliflozin propanediol (FARXIGA) 10 MG TABS tablet Take 1 tablet (10 mg total) by mouth daily before breakfast. 06/06/22   Corky Crafts, MD  diphenoxylate-atropine (LOMOTIL) 2.5-0.025 MG tablet Take 1 tablet by mouth 4 (four) times daily as needed for diarrhea or loose stools.    [provider]  ezetimibe (ZETIA) 10 MG tablet TAKE 1 TABLET BY MOUTH EVERY DAY 11/22/21   Lyn Records, MD  finasteride (PROSCAR) 5 MG tablet Take 5 mg by mouth at bedtime. 12/26/20   Eustaquio Boyden, MD  icosapent Ethyl (VASCEPA) 1 g capsule TAKE 2 CAPSULES BY MOUTH 2 TIMES DAILY. 07/07/22   Corky Crafts, MD  MAGNESIUM PO Take 1-2 tablets by mouth daily as needed (constipation). Alternates taking 1 and 2 tablets at bedtime every other day    [provider]  nitroGLYCERIN (NITROSTAT) 0.4 MG SL tablet Place 1 tablet (0.4 mg total) under the tongue every 5 (five) minutes as needed for chest pain. 08/07/21   Leone Brand, NP  OVER THE COUNTER MEDICATION Apply 1 Application topically daily. Athlete's foot cream    [provider]  pantoprazole (PROTONIX) 40  MG tablet TAKE 1 TABLET BY MOUTH EVERY DAY 04/21/22   Eustaquio Boyden, MD  predniSONE (DELTASONE) 10 MG tablet Take as directed (Currently taking 21 mg daily- decrease by 1 mg as tolerated) 09/19/19   Pyrtle, Carie Caddy, MD  sacubitril-valsartan (ENTRESTO) 24-26 MG Take 1 tablet by mouth 2 (two) times daily. 08/20/22   Corky Crafts, MD  silodosin (RAPAFLO) 8 MG CAPS capsule Take 8 mg by mouth in the morning. 12/26/20   Eustaquio Boyden, MD  spironolactone (ALDACTONE) 25 MG tablet Take 0.5 tablets (12.5 mg total) by mouth every Monday, Wednesday, and Friday. 05/30/22   Corky Crafts, MD  tadalafil (CIALIS) 20 MG tablet Take 1 tablet (20 mg total) by mouth daily  as needed for erectile dysfunction. 01/27/22   Eustaquio Boyden, MD  tadalafil (CIALIS) 5 MG tablet Take 5 mg by mouth daily. 09/01/22   [provider]  vedolizumab (ENTYVIO) 300 MG injection Inject 300 mg into the vein every 28 (twenty-eight) days.    [provider]      Allergies    Cymbalta [duloxetine hcl], Infliximab, and Keflex [cephalexin]    Review of Systems   Review of Systems  Eyes:  Negative for visual disturbance.  Neurological:  Positive for headaches.    Physical Exam Updated Vital Signs BP 132/73   Pulse 62   Temp 98.5 F (36.9 C)   Resp 17   Ht 5\' 7"  (1.702 m)   Wt 72.6 kg   SpO2 99%   BMI 25.06 kg/m  Physical Exam Vitals and nursing note reviewed.  Constitutional:      General: He is not in acute distress.    Appearance: He is well-developed.  HENT:     Head: Normocephalic and atraumatic.  Eyes:     Intraocular pressure: Right eye pressure is 18 mmHg. Measurements were taken using a handheld tonometer.    Extraocular Movements: Extraocular movements intact.     Conjunctiva/sclera:     Right eye: Right conjunctiva is injected.     Pupils: Pupils are equal, round, and reactive to light.     Comments: Tenderness to palpation superior to right orbit, but no tenderness to palpation of right temple  Cardiovascular:     Rate and Rhythm: Normal rate and regular rhythm.     Heart sounds: No murmur heard. Pulmonary:     Effort: Pulmonary effort is normal. No respiratory distress.     Breath sounds: Normal breath sounds.  Abdominal:     Palpations: Abdomen is soft.     Tenderness: There is no abdominal tenderness.  Musculoskeletal:        General: No swelling.     Cervical back: Neck supple.  Skin:    General: Skin is warm and dry.     Capillary Refill: Capillary refill takes less than 2 seconds.  Neurological:     Mental Status: He is alert.  Psychiatric:        Mood and Affect: Mood normal.     ED Results / Procedures /  Treatments   Labs (all labs ordered are listed, but only abnormal results are displayed) Labs Reviewed  BASIC METABOLIC PANEL - Abnormal; Notable for the following components:      Result Value   Glucose, Bld 185 (*)    BUN 25 (*)    All other components within normal limits  CBC - Abnormal; Notable for the following components:   RDW 17.4 (*)    All other components within  normal limits  URINALYSIS, ROUTINE W REFLEX MICROSCOPIC - Abnormal; Notable for the following components:   Glucose, UA >1,000 (*)    Hgb urine dipstick TRACE (*)    All other components within normal limits    EKG Jack [name]  Radiology CT Head Wo Contrast  Result Date: 10/21/2022 CLINICAL DATA:  Headache, new onset. EXAM: CT HEAD WITHOUT CONTRAST TECHNIQUE: Contiguous axial images were obtained from the base of the skull through the vertex without intravenous contrast. RADIATION DOSE REDUCTION: This exam was performed according to the departmental dose-optimization program which includes automated exposure control, adjustment of the mA and/or kV according to patient size and/or use of iterative reconstruction technique. COMPARISON:  Jack [name] Available. FINDINGS: Brain: No evidence of acute infarction, hemorrhage, hydrocephalus, extra-axial collection or mass lesion/mass effect. Vascular: No hyperdense vessel or unexpected calcification. Skull: Normal. Negative for fracture or focal lesion. Sinuses/Orbits: Complete opacification of the right maxillary sinus and partial opacification of the right maxillary sinus. Patchy opacification of the ethmoid air cells. Other: Jack [name]. IMPRESSION: 1. No acute intracranial abnormality. 2. Paranasal sinus disease. Electronically Signed   By: Larose Hires D.O.   On: 10/21/2022 19:32    Procedures Procedures    Medications Ordered in ED Medications  ketorolac (TORADOL) 15 MG/ML injection 15 mg (15 mg Intravenous Given 10/21/22 2146)  diphenhydrAMINE (BENADRYL) injection 25 mg (25 mg  Intravenous Given 10/21/22 2146)  metoCLOPramide (REGLAN) injection 10 mg (10 mg Intravenous Given 10/21/22 2145)  sodium chloride 0.9 % bolus 1,000 mL (0 mLs Intravenous Stopped 10/21/22 2318)  tetracaine (PONTOCAINE) 0.5 % ophthalmic solution 1 drop (1 drop Right Eye Given by Other 10/21/22 2327)    ED Course/ Medical Decision Making/ A&P                             Medical Decision Making Patient is a 57 year old male, here for right-sided headache going on for the last 3 days, has no neurodeficits on exam, given new onset headache, that is severe we will obtain head CT.  Additionally will obtain labs, for further evaluation and he does have some rhinorrhea, as well as tearing of hurt his right eye, I suspicious he may be having cluster headaches, we will put him on nonrebreather, and monitor for any relief, further evaluation he is having some relief, but not complete relief we will also give him a headache cocktail.  Amount and/or Complexity of Data Reviewed Labs: ordered.    Details: Blood work unremarkable Radiology: ordered.    Details: CT head unremarkable Discussion of management or test interpretation with external provider(s): Discussed with patient, he has no neurodeficits, his pressure in his eye is 18, which is within normal limits, and he has no visual deficits, I am since suspicious that he may have a combination of cluster headache, and a migraine.  He is feeling much better, has no neurodeficits, is well-appearing discharged home.  Risk Prescription drug management.    Final Clinical Impression(s) / ED Diagnoses Final diagnoses:  Acute nonintractable headache, unspecified headache type    Rx / DC Orders ED Discharge Orders     Jack [name]         ,  L, PA 10/22/22 0021    Edwin Dada P, DO 10/31/22 1340

## 2022-10-22 ENCOUNTER — Ambulatory Visit (INDEPENDENT_AMBULATORY_CARE_PROVIDER_SITE_OTHER): Payer: BC Managed Care – PPO | Admitting: Family Medicine

## 2022-10-22 ENCOUNTER — Encounter: Payer: Self-pay | Admitting: Family Medicine

## 2022-10-22 VITALS — BP 122/62 | HR 75 | Temp 97.7°F | Ht 67.0 in | Wt 171.0 lb

## 2022-10-22 DIAGNOSIS — D72119 Hypereosinophilic syndrome (hes), unspecified: Secondary | ICD-10-CM

## 2022-10-22 DIAGNOSIS — R519 Headache, unspecified: Secondary | ICD-10-CM

## 2022-10-22 DIAGNOSIS — I5022 Chronic systolic (congestive) heart failure: Secondary | ICD-10-CM

## 2022-10-22 DIAGNOSIS — I10 Essential (primary) hypertension: Secondary | ICD-10-CM | POA: Diagnosis not present

## 2022-10-22 DIAGNOSIS — Z7952 Long term (current) use of systemic steroids: Secondary | ICD-10-CM

## 2022-10-22 MED ORDER — VERAPAMIL HCL ER 120 MG PO TBCR: EXTENDED_RELEASE_TABLET | ORAL | 0 refills | Status: DC

## 2022-10-22 MED ORDER — AMOXICILLIN 875 MG PO TABS: 875.0000 mg | ORAL_TABLET | Freq: Two times a day (BID) | ORAL | 0 refills | Status: DC

## 2022-10-22 NOTE — Patient Instructions (Signed)
Possible cluster headaches - start verapamil 120mg  nightly for 1-2 nights and if needed may take twice daily as needed for cluster headache. If headache improved, may stop.  Take amoxicillin 875mg  twice daily for 1 week for possible sinus infection component.  Let us know if not improving with this for further medicine change vs neurology evaluation.  Schedule eye exam

## 2022-10-22 NOTE — Progress Notes (Signed)
Ph: [phone] Fax: [phone]   Patient ID: Jack [name], male    DOB: 09-11-1965, 57 y.o.   MRN: [id number]  This visit was conducted in person.  BP 122/62   Pulse 75   Temp 97.7 F (36.5 C) (Temporal)   Ht 5\' 7"  (1.702 m)   Wt 171 lb (77.6 kg)   SpO2 98%   BMI 26.78 kg/m   No results found.   CC: headache Subjective:   HPI: Jack [name] is a 57 y.o. male presenting on 10/22/2022 for Hospitalization Follow-up (Seen on 10/21/22 at [facility] ED, dx acute non-intractable HA. C/o ongoing HA off and on. Pt accompanied by wife, Vernona Rieger. )   Seen at ER yesterday with throbbing headache above R eye for 3 days associated with mild nausea, tearing of that eye, and runny nose on right, eye was red, symptoms progressively worsening. No photo/phonophobia, vomiting. Not activity limiting. At its worse 8-9/10.  Records reviewed.  Labwork largely unrevealing.  CT head done - overall reassuring.  Treated with high flow oxygen with limited benefit.  Also treated with toradol 15mg  followed by benadryl and reglan as well as IVF.   Took 2 tylenol this morning.  Pain worsens overnight.  Headache started during beach trip on Sunday. Progressively worsened as day came on.  Normally doesn't get headaches. No h/o migraines.  Currently 2/10 pain.   CT scan showed sinus disease (R maxillary sinus).  Some R earache. Some R periorbital pressure. Clear nasal discharge.  Notes recently productive cough for the past week.  No recent fevers/chills, ST, PNdrainage.  No sick contacts.   Last eye exam 02/05/2022 [facility]).   Known h/o CAD (STEMI 2019 s/p DES to LAD) with resultant ischemic CM, chronic sCHF s/p medtronic single chamber ICD placed 05/2022, crohn's, hypereosinophilic syndrome (chrug strauss, EoE), seizures.  Sees [facility], and pulmonology.  Sees [place] rheumatology Kathi Ludwig).  Currently on prednisone 16mg  daily, unable to wean down further.      Relevant past medical, surgical,  family and social history reviewed and updated as indicated. Interim medical history since our last visit reviewed. Allergies and medications reviewed and updated. Outpatient Medications Prior to Visit  Medication Sig Dispense Refill   acetaminophen (TYLENOL) 325 MG tablet Take 2 tablets (650 mg total) by mouth every 4 (four) hours as needed for headache or mild pain.     alendronate (FOSAMAX) 70 MG tablet Take 70 mg by mouth every Saturday.   11   amitriptyline (ELAVIL) 25 MG tablet Take 1 tablet (25 mg total) by mouth at bedtime as needed for sleep. 90 tablet 3   aspirin EC 81 MG tablet Take 81 mg by mouth daily.     atorvastatin (LIPITOR) 80 MG tablet TAKE 1 TABLET BY MOUTH DAILY AT 6 PM. 90 tablet 3   azaTHIOprine (IMURAN) 50 MG tablet Take 3 tablets (150 mg total) by mouth at bedtime. 270 tablet 0   Calcium Carb-Cholecalciferol (CALCIUM + VITAMIN D3 PO) Take 1 tablet by mouth 2 (two) times daily.     carvedilol (COREG) 3.125 MG tablet TAKE 1 TABLET BY MOUTH 2 TIMES DAILY. 180 tablet 3   clopidogrel (PLAVIX) 75 MG tablet Take 1 tablet (75 mg total) by mouth daily. 90 tablet 3   dapagliflozin propanediol (FARXIGA) 10 MG TABS tablet Take 1 tablet (10 mg total) by mouth daily before breakfast. 30 tablet 11   diphenoxylate-atropine (LOMOTIL) 2.5-0.025 MG tablet Take 1 tablet by mouth 4 (four)  times daily as needed for diarrhea or loose stools.     ezetimibe (ZETIA) 10 MG tablet TAKE 1 TABLET BY MOUTH EVERY DAY 90 tablet 3   finasteride (PROSCAR) 5 MG tablet Take 5 mg by mouth at bedtime.     icosapent Ethyl (VASCEPA) 1 g capsule TAKE 2 CAPSULES BY MOUTH 2 TIMES DAILY. 120 capsule 11   MAGNESIUM PO Take 1-2 tablets by mouth daily as needed (constipation). Alternates taking 1 and 2 tablets at bedtime every other day     nitroGLYCERIN (NITROSTAT) 0.4 MG SL tablet Place 1 tablet (0.4 mg total) under the tongue every 5 (five) minutes as needed for chest pain. 25 tablet 4   OVER THE COUNTER MEDICATION  Apply 1 Application topically daily. Athlete's foot cream     pantoprazole (PROTONIX) 40 MG tablet TAKE 1 TABLET BY MOUTH EVERY DAY 90 tablet 2   predniSONE (DELTASONE) 10 MG tablet Take as directed (Currently taking 21 mg daily- decrease by 1 mg as tolerated) 100 tablet 0   sacubitril-valsartan (ENTRESTO) 24-26 MG Take 1 tablet by mouth 2 (two) times daily. 180 tablet 3   silodosin (RAPAFLO) 8 MG CAPS capsule Take 8 mg by mouth in the morning.     spironolactone (ALDACTONE) 25 MG tablet Take 0.5 tablets (12.5 mg total) by mouth every Monday, Wednesday, and Friday. 24 tablet 3   tadalafil (CIALIS) 20 MG tablet Take 1 tablet (20 mg total) by mouth daily as needed for erectile dysfunction. 20 tablet 6   tadalafil (CIALIS) 5 MG tablet Take 5 mg by mouth daily.     vedolizumab (ENTYVIO) 300 MG injection Inject 300 mg into the vein every 28 (twenty-eight) days.     Facility-Administered Medications Prior to Visit  Medication Dose Route Frequency Provider Last Rate Last Admin   ondansetron (ZOFRAN) 4 mg in sodium chloride 0.9 % 50 mL IVPB  4 mg Intravenous Q6H PRN Barnett Abu, MD         Per HPI unless specifically indicated in ROS section below Review of Systems  Objective:  BP 122/62   Pulse 75   Temp 97.7 F (36.5 C) (Temporal)   Ht 5\' 7"  (1.702 m)   Wt 171 lb (77.6 kg)   SpO2 98%   BMI 26.78 kg/m   Wt Readings from Last 3 Encounters:  10/22/22 171 lb (77.6 kg)  10/21/22 160 lb (72.6 kg)  09/25/22 167 lb (75.8 kg)      Physical Exam Vitals and nursing note reviewed.  Constitutional:      Appearance: Normal appearance. He is not ill-appearing.  HENT:     Head: Normocephalic and atraumatic.     Comments: No pain at temples     Right Ear: Tympanic membrane, ear canal and external ear normal. There is no impacted cerumen.     Left Ear: Tympanic membrane, ear canal and external ear normal. There is no impacted cerumen.     Nose: Nose normal. No congestion.     Mouth/Throat:      Mouth: Mucous membranes are moist.     Pharynx: Oropharynx is clear. No oropharyngeal exudate or posterior oropharyngeal erythema.  Eyes:     General:        Right eye: No discharge.        Left eye: No discharge.     Extraocular Movements: Extraocular movements intact.     Conjunctiva/sclera: Conjunctivae normal.     Pupils: Pupils are equal, round, and reactive to light.  Comments: No conjunctival injection  Neck:     Vascular: No carotid bruit.  Cardiovascular:     Rate and Rhythm: Normal rate and regular rhythm.     Pulses: Normal pulses.     Heart sounds: Normal heart sounds. No murmur heard. Pulmonary:     Effort: Pulmonary effort is normal. No respiratory distress.     Breath sounds: Normal breath sounds. No wheezing, rhonchi or rales.  Musculoskeletal:        General: Normal range of motion.     Cervical back: Normal range of motion and neck supple.  Lymphadenopathy:     Cervical: No cervical adenopathy.  Neurological:     General: No focal deficit present.     Mental Status: He is alert.     Comments:  CN 2-12 intact FTN intact EOMI  Psychiatric:        Mood and Affect: Mood normal.        Behavior: Behavior normal.       Results for orders placed or performed during the hospital encounter of 10/21/22  Basic metabolic panel  Result Value Ref Range   Sodium 139 135 - 145 mmol/L   Potassium 4.0 3.5 - 5.1 mmol/L   Chloride 105 98 - 111 mmol/L   CO2 25 22 - 32 mmol/L   Glucose, Bld 185 (H) 70 - 99 mg/dL   BUN 25 (H) 6 - 20 mg/dL   Creatinine, Ser 1.61 0.61 - 1.24 mg/dL   Calcium 9.5 8.9 - 09.6 mg/dL   GFR, Estimated >04 >54 mL/min   Anion gap 9 5 - 15  CBC  Result Value Ref Range   WBC 10.2 4.0 - 10.5 K/uL   RBC 4.83 4.22 - 5.81 MIL/uL   Hemoglobin 13.9 13.0 - 17.0 g/dL   HCT 09.8 11.9 - 14.7 %   MCV 92.1 80.0 - 100.0 fL   MCH 28.8 26.0 - 34.0 pg   MCHC 31.2 30.0 - 36.0 g/dL   RDW 82.9 (H) 56.2 - 13.0 %   Platelets 263 150 - 400 K/uL   nRBC 0.0 0.0  - 0.2 %  Urinalysis, Routine w reflex microscopic -Urine, Clean Catch  Result Value Ref Range   Color, Urine YELLOW YELLOW   APPearance CLEAR CLEAR   Specific Gravity, Urine 1.028 1.005 - 1.030   pH 5.5 5.0 - 8.0   Glucose, UA >1,000 (A) NEGATIVE mg/dL   Hgb urine dipstick TRACE (A) NEGATIVE   Bilirubin Urine NEGATIVE NEGATIVE   Ketones, ur NEGATIVE NEGATIVE mg/dL   Protein, ur NEGATIVE NEGATIVE mg/dL   Nitrite NEGATIVE NEGATIVE   Leukocytes,Ua NEGATIVE NEGATIVE   RBC / HPF 0-5 0 - 5 RBC/hpf   WBC, UA 0-5 0 - 5 WBC/hpf   Bacteria, UA NONE SEEN NONE SEEN   Squamous Epithelial / HPF 0-5 0 - 5 /HPF   Lab Results  Component Value Date   ESRSEDRATE 4 04/16/2022   CT Head Wo Contrast CLINICAL DATA:  Headache, new onset.  EXAM: CT HEAD WITHOUT CONTRAST  TECHNIQUE: Contiguous axial images were obtained from the base of the skull through the vertex without intravenous contrast.  RADIATION DOSE REDUCTION: This exam was performed according to the departmental dose-optimization program which includes automated exposure control, adjustment of the mA and/or kV according to patient size and/or use of iterative reconstruction technique.  COMPARISON:  None Available.  FINDINGS: Brain: No evidence of acute infarction, hemorrhage, hydrocephalus, extra-axial collection or mass lesion/mass effect.  Vascular: No  hyperdense vessel or unexpected calcification.  Skull: Normal. Negative for fracture or focal lesion.  Sinuses/Orbits: Complete opacification of the right maxillary sinus and partial opacification of the right maxillary sinus. Patchy opacification of the ethmoid air cells.  Other: None.  IMPRESSION: 1. No acute intracranial abnormality. 2. Paranasal sinus disease.  Electronically Signed   By: Larose Hires D.O.   On: 10/21/2022 19:32   Assessment & Plan:   Problem List Items Addressed This Visit     Essential hypertension (Chronic)    BP stable. Monitor blood  pressures with addition of verapamil.       Relevant Medications   verapamil (CALAN-SR) 120 MG CR tablet   Hypereosinophilic syndrome   Chronic systolic HF (heart failure) (HCC)   Relevant Medications   verapamil (CALAN-SR) 120 MG CR tablet   On prednisone therapy   Acute headache - Primary    ER records reviewed - reassuring head CT, did show R maxillary sinus disease. Concern for cluster headache vs other, ?sinus component. Treated with high flow oxygen and migraine cocktail (toradol, benadryl, reglan) - unsure which was most effective.  Rx amoxicillin for possible sinusitis component.  Rx verapamil SR 120mg  nightly x2 d then increase to BID dosing to treat possible cluster headache.  Avoid triptans in cardiac history.  He's already on chronic prednisone for hypereosinophilic syndrome.  Consider ESR to eval for GCA if no benefit with above.  If not improving, low threshold to refer to neurology. Pt/wife agree with plan.      Relevant Medications   verapamil (CALAN-SR) 120 MG CR tablet     Meds ordered this encounter  Medications   verapamil (CALAN-SR) 120 MG CR tablet    Sig: Take 1 tablet (120 mg total) by mouth at bedtime for 2 days, THEN 1 tablet (120 mg total) 2 (two) times daily. As needed for cluster headaches.    Dispense:  60 tablet    Refill:  0   amoxicillin (AMOXIL) 875 MG tablet    Sig: Take 1 tablet (875 mg total) by mouth 2 (two) times daily.    Dispense:  14 tablet    Refill:  0    No orders of the defined types were placed in this encounter.   Patient Instructions  Possible cluster headaches - start verapamil 120mg  nightly for 1-2 nights and if needed may take twice daily as needed for cluster headache. If headache improved, may stop.  Take amoxicillin 875mg  twice daily for 1 week for possible sinus infection component.  Let us know if not improving with this for further medicine change vs neurology evaluation.  Schedule eye exam   Follow up plan: No  follow-ups on file.  Eustaquio Boyden, MD

## 2022-10-23 ENCOUNTER — Telehealth: Payer: Self-pay | Admitting: Pharmacy Technician

## 2022-10-23 NOTE — Telephone Encounter (Signed)
Dr. Rhea Belton, [place] Childes note:   Auth Submission: APPROVED Site of care: Site of care: [facility] Payer: BCBS Medication & CPT/J Code(s) submitted: Entyvio (Vedolizumab) [id number] Route of submission (phone, fax, portal):  Phone #[phone] Fax # [phone] Auth type: Buy/Bill Units/visits requested: 12 DOSES 3900 UNITS  Reference number: [id number] Approval from: 10/22/22 to 10/21/23   [facility] co-pay card: Approved Id: [id number] GR: [id number] BIN: [id number]  Service site has been verified: [facility] Awaiting updated fax letter. Rep: Edilia Bo 10/23/22 10:21a NPI and Tax has been verified.

## 2022-10-27 ENCOUNTER — Telehealth: Payer: Self-pay | Admitting: Family Medicine

## 2022-10-27 DIAGNOSIS — R519 Headache, unspecified: Secondary | ICD-10-CM | POA: Insufficient documentation

## 2022-10-27 NOTE — Telephone Encounter (Signed)
Seen last Wednesday, treated for possible cluster headache with verapamil, as well as possible sinusitis with amoxicillin component. Plz call for update on symptoms - did treatment help, which treatment seemed to help more?   Was he able to see eye doctor?  If ongoing headaches, would recommend come in for labs and referral to neurology.

## 2022-10-27 NOTE — Assessment & Plan Note (Addendum)
ER records reviewed - reassuring head CT, did show R maxillary sinus disease. Concern for cluster headache vs other, ?sinus component. Treated with high flow oxygen and migraine cocktail (toradol, benadryl, reglan) - unsure which was most effective.  Rx amoxicillin for possible sinusitis component.  Rx verapamil SR 120mg  nightly x2 d then increase to BID dosing to treat possible cluster headache.  Avoid triptans in cardiac history.  He's already on chronic prednisone for hypereosinophilic syndrome.  Consider ESR to eval for GCA if no benefit with above.  If not improving, low threshold to refer to neurology. Pt/wife agree with plan.

## 2022-10-27 NOTE — Telephone Encounter (Addendum)
Spoke pt's wife, Vernona Rieger (on dpr) asking Dr. Timoteo Expose questions. States pt did not c/o HA when waking yesterday nor today. He did not have to take verapamil today. Says they couldn't say for sure which med helped more. Still not sleeping well. Pt chose not see eye doc since he was improving. I relayed Dr. Timoteo Expose message. She verbalizes understanding and will inform pt.

## 2022-10-27 NOTE — Assessment & Plan Note (Addendum)
BP stable. Monitor blood pressures with addition of verapamil.

## 2022-10-28 ENCOUNTER — Other Ambulatory Visit: Payer: Self-pay

## 2022-10-28 DIAGNOSIS — K5090 Crohn's disease, unspecified, without complications: Secondary | ICD-10-CM | POA: Diagnosis not present

## 2022-10-28 MED ORDER — EZETIMIBE 10 MG PO TABS
10.0000 mg | ORAL_TABLET | Freq: Every day | ORAL | 3 refills | Status: DC
  Filled 2023-01-12: qty 90, 90d supply, fill #0
  Filled 2023-04-06: qty 90, 90d supply, fill #1
  Filled 2023-07-08: qty 90, 90d supply, fill #2

## 2022-11-05 ENCOUNTER — Other Ambulatory Visit (HOSPITAL_COMMUNITY): Payer: Self-pay

## 2022-11-05 MED ORDER — TADALAFIL 20 MG PO TABS
20.0000 mg | ORAL_TABLET | Freq: Every day | ORAL | 6 refills | Status: DC | PRN
Start: 2022-01-27 — End: 2022-11-05
  Filled 2022-11-05: qty 4, 30d supply, fill #0

## 2022-11-05 MED ORDER — AMITRIPTYLINE HCL 25 MG PO TABS
25.0000 mg | ORAL_TABLET | Freq: Every evening | ORAL | 3 refills | Status: DC | PRN
  Filled 2022-11-05: qty 90, 90d supply, fill #0

## 2022-11-06 ENCOUNTER — Other Ambulatory Visit (HOSPITAL_COMMUNITY): Payer: Self-pay

## 2022-11-06 MED ORDER — ALENDRONATE SODIUM 70 MG PO TABS
70.0000 mg | ORAL_TABLET | ORAL | 0 refills | Status: DC
  Filled 2022-11-13: qty 12, 84d supply, fill #0

## 2022-11-06 MED ORDER — FINASTERIDE 5 MG PO TABS
5.0000 mg | ORAL_TABLET | Freq: Every day | ORAL | 3 refills | Status: DC
  Filled 2022-11-13: qty 90, 90d supply, fill #0
  Filled 2023-02-09: qty 90, 90d supply, fill #1

## 2022-11-06 MED ORDER — TADALAFIL 5 MG PO TABS
5.0000 mg | ORAL_TABLET | Freq: Every day | ORAL | 6 refills | Status: DC
  Filled 2022-11-13: qty 30, 30d supply, fill #0

## 2022-11-10 ENCOUNTER — Other Ambulatory Visit (HOSPITAL_COMMUNITY): Payer: Self-pay

## 2022-11-13 ENCOUNTER — Encounter: Payer: Self-pay | Admitting: Internal Medicine

## 2022-11-13 ENCOUNTER — Other Ambulatory Visit (HOSPITAL_COMMUNITY): Payer: Self-pay

## 2022-11-13 ENCOUNTER — Other Ambulatory Visit: Payer: Self-pay | Admitting: Family Medicine

## 2022-11-13 MED ORDER — PREDNISONE 1 MG PO TABS: 4.0000 mg | ORAL_TABLET | Freq: Every day | ORAL | 3 refills | Status: DC

## 2022-11-13 MED ORDER — PREDNISONE 10 MG PO TABS
10.0000 mg | ORAL_TABLET | Freq: Every day | ORAL | 0 refills | Status: DC
  Filled 2022-11-13: qty 90, 90d supply, fill #0

## 2022-11-13 MED ORDER — SILODOSIN 8 MG PO CAPS
8.0000 mg | ORAL_CAPSULE | Freq: Every day | ORAL | 2 refills | Status: DC
  Filled 2022-11-13: qty 90, 90d supply, fill #0
  Filled 2023-02-09: qty 90, 90d supply, fill #1

## 2022-11-13 MED ORDER — PREDNISONE 5 MG PO TABS
5.0000 mg | ORAL_TABLET | Freq: Every day | ORAL | 3 refills | Status: DC
  Filled 2022-11-13: qty 90, 90d supply, fill #0
  Filled 2023-02-09: qty 90, 90d supply, fill #1

## 2022-11-13 MED ORDER — PREDNISONE 1 MG PO TABS
2.0000 mg | ORAL_TABLET | Freq: Every day | ORAL | 1 refills | Status: DC
  Filled 2022-11-13: qty 180, 90d supply, fill #0

## 2022-11-13 MED FILL — Pantoprazole Sodium EC Tab 40 MG (Base Equiv): ORAL | 90 days supply | Qty: 90 | Fill #0 | Status: AC

## 2022-11-13 MED FILL — Carvedilol Tab 3.125 MG: ORAL | 90 days supply | Qty: 180 | Fill #0 | Status: AC

## 2022-11-13 NOTE — Telephone Encounter (Signed)
Message from pharmacy:  REQUEST FOR 90 DAYS PRESCRIPTION.   Verapamil Last filled:  10/22/22, #60 Last OV:  10/22/22, hosp f/u Next OV:  01/30/23, CPE

## 2022-11-14 ENCOUNTER — Telehealth: Payer: Self-pay | Admitting: Interventional Cardiology

## 2022-11-14 ENCOUNTER — Other Ambulatory Visit: Payer: Self-pay

## 2022-11-14 ENCOUNTER — Other Ambulatory Visit (HOSPITAL_COMMUNITY): Payer: Self-pay

## 2022-11-14 NOTE — Telephone Encounter (Signed)
Patient's wife called to update the patient's pharmacy. Patient's wife is requesting we replace the [facility] in [place] with [facility]. Please advise.

## 2022-11-14 NOTE — Telephone Encounter (Signed)
Pharmacy updated. Patient's wife notified.

## 2022-11-17 NOTE — Telephone Encounter (Signed)
ERx Plz call - how are headaches doing? Verapamil hopefully will only be PRN medication.

## 2022-11-18 ENCOUNTER — Encounter: Payer: Self-pay | Admitting: Internal Medicine

## 2022-11-18 ENCOUNTER — Other Ambulatory Visit (HOSPITAL_COMMUNITY): Payer: Self-pay

## 2022-11-19 ENCOUNTER — Encounter: Payer: Self-pay | Admitting: Internal Medicine

## 2022-11-25 ENCOUNTER — Ambulatory Visit: Payer: Commercial Managed Care - PPO

## 2022-11-25 ENCOUNTER — Encounter: Payer: Self-pay | Admitting: Internal Medicine

## 2022-11-25 VITALS — BP 125/80 | HR 61 | Temp 97.8°F | Resp 16 | Ht 67.0 in | Wt 168.0 lb

## 2022-11-25 DIAGNOSIS — K5080 Crohn's disease of both small and large intestine without complications: Secondary | ICD-10-CM | POA: Diagnosis not present

## 2022-11-25 MED ORDER — VEDOLIZUMAB 300 MG IV SOLR
300.0000 mg | Freq: Once | INTRAVENOUS | Status: AC
  Administered 2022-11-25: 300 mg via INTRAVENOUS
  Filled 2022-11-25: qty 5

## 2022-11-25 NOTE — Progress Notes (Signed)
Diagnosis: Crohn's Disease  Provider:  Chilton Greathouse MD  Procedure: IV Infusion  IV Type: Peripheral, IV Location: L Forearm  Entyvio (Vedolizumab), Dose: 300 mg  Infusion Start Time: 1011  Infusion Stop Time: 1046  Post Infusion IV Care: Patient declined observation and Peripheral IV Discontinued  Discharge: Condition: Good, Destination: Home . AVS Declined  Performed by:  Loney Hering, LPN

## 2022-11-25 NOTE — Telephone Encounter (Signed)
Called and spoke to patient he states he has not had to take the medication, but he does have it on hand to use as needed. He states one trigger he notices if he takes shots of bourbon, he mentions drinking beer does not trigger the headaches.

## 2022-11-26 DIAGNOSIS — C61 Malignant neoplasm of prostate: Secondary | ICD-10-CM | POA: Diagnosis not present

## 2022-11-30 ENCOUNTER — Other Ambulatory Visit: Payer: Self-pay | Admitting: Internal Medicine

## 2022-12-03 DIAGNOSIS — R351 Nocturia: Secondary | ICD-10-CM | POA: Diagnosis not present

## 2022-12-03 DIAGNOSIS — N401 Enlarged prostate with lower urinary tract symptoms: Secondary | ICD-10-CM | POA: Diagnosis not present

## 2022-12-03 DIAGNOSIS — C61 Malignant neoplasm of prostate: Secondary | ICD-10-CM | POA: Diagnosis not present

## 2022-12-03 DIAGNOSIS — R3912 Poor urinary stream: Secondary | ICD-10-CM | POA: Diagnosis not present

## 2022-12-04 ENCOUNTER — Other Ambulatory Visit: Payer: Self-pay

## 2022-12-05 ENCOUNTER — Other Ambulatory Visit (HOSPITAL_COMMUNITY): Payer: Self-pay

## 2022-12-05 ENCOUNTER — Encounter: Payer: Self-pay | Admitting: Internal Medicine

## 2022-12-08 ENCOUNTER — Encounter: Payer: Self-pay | Admitting: Interventional Cardiology

## 2022-12-08 ENCOUNTER — Other Ambulatory Visit (HOSPITAL_COMMUNITY): Payer: Self-pay

## 2022-12-08 MED FILL — Azathioprine Tab 50 MG: ORAL | 90 days supply | Qty: 270 | Fill #0 | Status: AC

## 2022-12-09 ENCOUNTER — Other Ambulatory Visit (HOSPITAL_COMMUNITY): Payer: Self-pay

## 2022-12-10 ENCOUNTER — Other Ambulatory Visit: Payer: Self-pay

## 2022-12-10 ENCOUNTER — Encounter: Payer: Self-pay | Admitting: Internal Medicine

## 2022-12-10 ENCOUNTER — Other Ambulatory Visit (HOSPITAL_COMMUNITY): Payer: Self-pay

## 2022-12-10 MED ORDER — TADALAFIL 5 MG PO TABS
5.0000 mg | ORAL_TABLET | Freq: Every day | ORAL | 6 refills | Status: DC
Start: 2022-12-10 — End: 2023-07-08
  Filled 2022-12-10: qty 30, 30d supply, fill #0
  Filled 2023-01-06: qty 30, 30d supply, fill #1
  Filled 2023-02-25: qty 30, 30d supply, fill #2
  Filled 2023-03-27: qty 30, 30d supply, fill #3
  Filled 2023-04-27: qty 30, 30d supply, fill #4
  Filled 2023-05-27: qty 30, 30d supply, fill #5
  Filled 2023-06-26: qty 30, 30d supply, fill #6

## 2022-12-11 ENCOUNTER — Other Ambulatory Visit (HOSPITAL_COMMUNITY): Payer: Self-pay

## 2022-12-11 ENCOUNTER — Other Ambulatory Visit: Payer: Self-pay

## 2022-12-11 ENCOUNTER — Telehealth: Payer: Self-pay

## 2022-12-11 ENCOUNTER — Encounter: Payer: Self-pay | Admitting: Internal Medicine

## 2022-12-11 MED ORDER — PREDNISONE 10 MG PO TABS
10.0000 mg | ORAL_TABLET | Freq: Every day | ORAL | 0 refills | Status: DC
  Filled 2022-12-11: qty 90, 90d supply, fill #0

## 2022-12-11 NOTE — Telephone Encounter (Signed)
Pt needs rf on prednisone 10 mg daily #90

## 2022-12-11 NOTE — Telephone Encounter (Signed)
Opened in error

## 2022-12-11 NOTE — Telephone Encounter (Signed)
Prednisone 10 mg daily #90 sent to [facility] outpt pharmacy

## 2022-12-22 ENCOUNTER — Ambulatory Visit (INDEPENDENT_AMBULATORY_CARE_PROVIDER_SITE_OTHER): Payer: Commercial Managed Care - PPO

## 2022-12-22 ENCOUNTER — Telehealth: Payer: Self-pay | Admitting: *Deleted

## 2022-12-22 DIAGNOSIS — I5022 Chronic systolic (congestive) heart failure: Secondary | ICD-10-CM

## 2022-12-22 NOTE — Telephone Encounter (Signed)
Yesi,  This pt's cardiac EF is below the [facility] standard.  His procedure will need to be done at the hospital.  Thanks,  Cathlyn Parsons

## 2022-12-22 NOTE — Telephone Encounter (Signed)
Pt.scheduled at hospital 01/12/23

## 2022-12-23 LAB — CUP PACEART REMOTE DEVICE CHECK
Battery Remaining Longevity: 164 mo
Battery Voltage: 3.05 V
Brady Statistic RA Percent Paced: INVALID
Brady Statistic RV Percent Paced: 0.01 %
Date Time Interrogation Session: 20240922220018
HighPow Impedance: 67 Ohm
Implantable Lead Connection Status: 753985
Implantable Lead Implant Date: 20240325
Implantable Lead Location: 753860
Implantable Lead Model: 6935
Implantable Pulse Generator Implant Date: 20240325
Lead Channel Impedance Value: 494 Ohm
Lead Channel Impedance Value: 627 Ohm
Lead Channel Pacing Threshold Amplitude: 0.5 V
Lead Channel Pacing Threshold Pulse Width: 0.4 ms
Lead Channel Sensing Intrinsic Amplitude: 24.8 mV
Lead Channel Setting Pacing Amplitude: 2 V
Lead Channel Setting Pacing Pulse Width: 0.4 ms
Lead Channel Setting Sensing Sensitivity: 0.3 mV
Zone Setting Status: 755011

## 2022-12-24 ENCOUNTER — Encounter: Payer: Self-pay | Admitting: Internal Medicine

## 2022-12-29 ENCOUNTER — Ambulatory Visit (AMBULATORY_SURGERY_CENTER): Payer: Commercial Managed Care - PPO

## 2022-12-29 ENCOUNTER — Ambulatory Visit (INDEPENDENT_AMBULATORY_CARE_PROVIDER_SITE_OTHER): Payer: Commercial Managed Care - PPO

## 2022-12-29 ENCOUNTER — Encounter: Payer: Self-pay | Admitting: Internal Medicine

## 2022-12-29 VITALS — BP 107/69 | HR 65 | Temp 97.5°F | Resp 16 | Ht 67.0 in | Wt 168.9 lb

## 2022-12-29 VITALS — Ht 67.0 in | Wt 160.0 lb

## 2022-12-29 DIAGNOSIS — K5080 Crohn's disease of both small and large intestine without complications: Secondary | ICD-10-CM | POA: Diagnosis not present

## 2022-12-29 MED ORDER — VEDOLIZUMAB 300 MG IV SOLR
300.0000 mg | Freq: Once | INTRAVENOUS | Status: AC
  Administered 2022-12-29: 300 mg via INTRAVENOUS
  Filled 2022-12-29: qty 5

## 2022-12-29 NOTE — Progress Notes (Signed)
Pre visit completed via phone call with patient's wife; Wife verified name, DOB, and address; No egg or soy allergy known to patient's wife; No issues known with past sedation with any surgeries or procedures; denies ever being told they had issues or difficulty with intubation;  No FH of Malignant Hyperthermia; is not on diet pills; is not on home 02;  is not on blood thinners;  denies issues with constipation  No A fib or A flutter; Have any cardiac testing pending--NO Insurance verified during PV appt--- [facility] can ambulate without assistance;  denies use of chewing tobacco; Discussed diabetic/weight loss medication holds; Discussed NSAID holds; Checked BMI to be less than 50; instructed to use [employer] or GoodRx for a price reduction on prep;   Pre visit completed and red dot placed by patient's name on their procedure day (on provider's schedule);   Instructions sent to MyChart for patient's wife request;  Patient's wife reports that the patient already has Sutab RX at home from previously scheduled procedure that was not used; RX for prep not sent in today during PV;

## 2022-12-29 NOTE — Progress Notes (Signed)
Diagnosis: Crohn's Disease  Provider:  Chilton Greathouse MD  Procedure: IV Infusion  IV Type: Peripheral, IV Location: L Forearm  Entyvio (Vedolizumab), Dose: 300 mg  Infusion Start Time: 1033  Infusion Stop Time: 1104  Post Infusion IV Care: Peripheral IV Discontinued  Discharge: Condition: Good, Destination: Home . AVS Declined  Performed by:  Rico Ala, LPN

## 2023-01-05 ENCOUNTER — Encounter (HOSPITAL_COMMUNITY): Payer: Self-pay | Admitting: Internal Medicine

## 2023-01-05 NOTE — Progress Notes (Signed)
Remote ICD transmission.   

## 2023-01-12 ENCOUNTER — Ambulatory Visit (HOSPITAL_COMMUNITY): Payer: Self-pay | Admitting: Anesthesiology

## 2023-01-12 ENCOUNTER — Other Ambulatory Visit (HOSPITAL_COMMUNITY): Payer: Self-pay

## 2023-01-12 ENCOUNTER — Encounter (HOSPITAL_COMMUNITY)
Admission: RE | Disposition: A | Discharge status: Home / Self Care | Payer: Self-pay | Source: Home / Self Care | Attending: Internal Medicine

## 2023-01-12 ENCOUNTER — Other Ambulatory Visit: Payer: Self-pay

## 2023-01-12 ENCOUNTER — Other Ambulatory Visit: Payer: Self-pay | Admitting: Internal Medicine

## 2023-01-12 ENCOUNTER — Encounter: Payer: Self-pay | Admitting: Internal Medicine

## 2023-01-12 ENCOUNTER — Encounter (HOSPITAL_COMMUNITY): Payer: Self-pay | Admitting: Internal Medicine

## 2023-01-12 ENCOUNTER — Ambulatory Visit (HOSPITAL_BASED_OUTPATIENT_CLINIC_OR_DEPARTMENT_OTHER): Payer: Self-pay | Admitting: Anesthesiology

## 2023-01-12 ENCOUNTER — Ambulatory Visit (HOSPITAL_COMMUNITY)
Admission: RE | Admit: 2023-01-12 | Discharge: 2023-01-12 | Disposition: A | Discharge status: Home / Self Care | Payer: Commercial Managed Care - PPO | Attending: Internal Medicine | Admitting: Internal Medicine

## 2023-01-12 DIAGNOSIS — K529 Noninfective gastroenteritis and colitis, unspecified: Secondary | ICD-10-CM | POA: Diagnosis not present

## 2023-01-12 DIAGNOSIS — G4730 Sleep apnea, unspecified: Secondary | ICD-10-CM | POA: Insufficient documentation

## 2023-01-12 DIAGNOSIS — D125 Benign neoplasm of sigmoid colon: Secondary | ICD-10-CM

## 2023-01-12 DIAGNOSIS — Z860101 Personal history of adenomatous and serrated colon polyps: Secondary | ICD-10-CM | POA: Diagnosis not present

## 2023-01-12 DIAGNOSIS — Z8546 Personal history of malignant neoplasm of prostate: Secondary | ICD-10-CM | POA: Insufficient documentation

## 2023-01-12 DIAGNOSIS — K648 Other hemorrhoids: Secondary | ICD-10-CM | POA: Insufficient documentation

## 2023-01-12 DIAGNOSIS — K5080 Crohn's disease of both small and large intestine without complications: Secondary | ICD-10-CM | POA: Diagnosis not present

## 2023-01-12 DIAGNOSIS — R569 Unspecified convulsions: Secondary | ICD-10-CM | POA: Insufficient documentation

## 2023-01-12 DIAGNOSIS — I25119 Atherosclerotic heart disease of native coronary artery with unspecified angina pectoris: Secondary | ICD-10-CM | POA: Insufficient documentation

## 2023-01-12 DIAGNOSIS — K219 Gastro-esophageal reflux disease without esophagitis: Secondary | ICD-10-CM | POA: Diagnosis not present

## 2023-01-12 DIAGNOSIS — F17290 Nicotine dependence, other tobacco product, uncomplicated: Secondary | ICD-10-CM | POA: Diagnosis not present

## 2023-01-12 DIAGNOSIS — Z1211 Encounter for screening for malignant neoplasm of colon: Secondary | ICD-10-CM | POA: Diagnosis not present

## 2023-01-12 DIAGNOSIS — I5022 Chronic systolic (congestive) heart failure: Secondary | ICD-10-CM | POA: Diagnosis not present

## 2023-01-12 DIAGNOSIS — K50819 Crohn's disease of both small and large intestine with unspecified complications: Secondary | ICD-10-CM | POA: Insufficient documentation

## 2023-01-12 DIAGNOSIS — Z79624 Long term (current) use of inhibitors of nucleotide synthesis: Secondary | ICD-10-CM | POA: Diagnosis not present

## 2023-01-12 DIAGNOSIS — K635 Polyp of colon: Secondary | ICD-10-CM

## 2023-01-12 DIAGNOSIS — R519 Headache, unspecified: Secondary | ICD-10-CM | POA: Diagnosis not present

## 2023-01-12 DIAGNOSIS — Z955 Presence of coronary angioplasty implant and graft: Secondary | ICD-10-CM | POA: Insufficient documentation

## 2023-01-12 DIAGNOSIS — D120 Benign neoplasm of cecum: Secondary | ICD-10-CM

## 2023-01-12 DIAGNOSIS — Z7902 Long term (current) use of antithrombotics/antiplatelets: Secondary | ICD-10-CM | POA: Insufficient documentation

## 2023-01-12 DIAGNOSIS — D124 Benign neoplasm of descending colon: Secondary | ICD-10-CM | POA: Insufficient documentation

## 2023-01-12 DIAGNOSIS — I2510 Atherosclerotic heart disease of native coronary artery without angina pectoris: Secondary | ICD-10-CM

## 2023-01-12 DIAGNOSIS — I340 Nonrheumatic mitral (valve) insufficiency: Secondary | ICD-10-CM | POA: Insufficient documentation

## 2023-01-12 DIAGNOSIS — I110 Hypertensive heart disease with heart failure: Secondary | ICD-10-CM | POA: Diagnosis not present

## 2023-01-12 DIAGNOSIS — Z83719 Family history of colon polyps, unspecified: Secondary | ICD-10-CM | POA: Diagnosis not present

## 2023-01-12 DIAGNOSIS — K5140 Inflammatory polyps of colon without complications: Secondary | ICD-10-CM | POA: Diagnosis not present

## 2023-01-12 DIAGNOSIS — J984 Other disorders of lung: Secondary | ICD-10-CM | POA: Diagnosis not present

## 2023-01-12 DIAGNOSIS — K5010 Crohn's disease of large intestine without complications: Secondary | ICD-10-CM

## 2023-01-12 DIAGNOSIS — I252 Old myocardial infarction: Secondary | ICD-10-CM | POA: Insufficient documentation

## 2023-01-12 HISTORY — PX: POLYPECTOMY: SHX5525

## 2023-01-12 HISTORY — PX: HEMOSTASIS CLIP PLACEMENT: SHX6857

## 2023-01-12 HISTORY — PX: BIOPSY: SHX5522

## 2023-01-12 HISTORY — PX: COLONOSCOPY WITH PROPOFOL: SHX5780

## 2023-01-12 SURGERY — COLONOSCOPY WITH PROPOFOL
Anesthesia: Monitor Anesthesia Care

## 2023-01-12 MED ORDER — PREDNISONE 10 MG PO TABS
10.0000 mg | ORAL_TABLET | Freq: Every day | ORAL | 0 refills | Status: DC
  Filled 2023-01-12: qty 90, 90d supply, fill #0

## 2023-01-12 MED ORDER — LIDOCAINE 2% (20 MG/ML) 5 ML SYRINGE
INTRAMUSCULAR | Status: DC | PRN
  Administered 2023-01-12: 50 mg via INTRAVENOUS

## 2023-01-12 MED ORDER — SODIUM CHLORIDE 0.9 % IV SOLN
INTRAVENOUS | Status: DC | PRN
Start: 2023-01-12 — End: 2023-01-12

## 2023-01-12 MED ORDER — PROPOFOL 10 MG/ML IV BOLUS
INTRAVENOUS | Status: DC | PRN
  Administered 2023-01-12 (×3): 10 mg via INTRAVENOUS

## 2023-01-12 MED ORDER — PROPOFOL 500 MG/50ML IV EMUL
INTRAVENOUS | Status: DC | PRN
  Administered 2023-01-12: 100 ug/kg/min via INTRAVENOUS

## 2023-01-12 MED ORDER — EPHEDRINE SULFATE-NACL 50-0.9 MG/10ML-% IV SOSY
PREFILLED_SYRINGE | INTRAVENOUS | Status: DC | PRN
Start: 2023-01-12 — End: 2023-01-12
  Administered 2023-01-12 (×2): 5 mg via INTRAVENOUS

## 2023-01-12 MED ORDER — PROPOFOL 1000 MG/100ML IV EMUL
INTRAVENOUS | Status: AC
  Filled 2023-01-12: qty 100

## 2023-01-12 MED ORDER — PHENYLEPHRINE 80 MCG/ML (10ML) SYRINGE FOR IV PUSH (FOR BLOOD PRESSURE SUPPORT)
PREFILLED_SYRINGE | INTRAVENOUS | Status: DC | PRN
Start: 2023-01-12 — End: 2023-01-12
  Administered 2023-01-12 (×3): 80 ug via INTRAVENOUS

## 2023-01-12 MED ORDER — ONDANSETRON HCL 4 MG/2ML IJ SOLN
INTRAMUSCULAR | Status: DC | PRN
  Administered 2023-01-12: 4 mg via INTRAVENOUS

## 2023-01-12 SURGICAL SUPPLY — 22 items

## 2023-01-12 NOTE — Op Note (Signed)
[facility] Patient Name: Jack [name] Procedure Date: 01/12/2023 MRN: [id number] Attending MD: Beverley Fiedler , MD, [id number] Date of Birth: Jul 30, 1965 CSN: [id number] Age: 57 Admit Type: Outpatient Procedure:                Colonoscopy Indications:              Crohn's disease of the small bowel and colon with                            disease activity assessment (current therapy                            Entyvio 300 mg every 6 weeks, aza 150 mg daily,                            prednisone 16 mg daily driven by pulmonary                            disease); last colon Sept 2020 (SES-CD = 2, bx mild                            inflammation without dysplasia right and left                            colon) + 1 adenoma and 7 inflammatory polyps Providers:                Carie Caddy. Rhea Belton, MD, Lorenza Evangelist, RN, Sunday Corn                            [place], Technician Referring MD:              Medicines:                Monitored Anesthesia Care Complications:            No immediate complications. Estimated Blood Loss:     Estimated blood loss was minimal. Procedure:                Pre-Anesthesia Assessment:                           - Prior to the procedure, a History and Physical                            was performed, and patient medications and                            allergies were reviewed. The patient's tolerance of                            previous anesthesia was also reviewed. The risks                            and benefits of the procedure and the sedation  options and risks were discussed with the patient.                            All questions were answered, and informed consent                            was obtained. Prior Anticoagulants: The patient has                            taken Plavix (clopidogrel), last dose was 5 days                            prior to procedure. ASA Grade Assessment: III - A                             patient with severe systemic disease. After                            reviewing the risks and benefits, the patient was                            deemed in satisfactory condition to undergo the                            procedure.                           After obtaining informed consent, the colonoscope                            was passed under direct vision. Throughout the                            procedure, the patient's blood pressure, pulse, and                            oxygen saturations were monitored continuously. The                            PCF-HQ190L ([id number]) Olympus colonoscope was                            introduced through the anus and advanced to the                            ileocecal valve. The colonoscopy was performed                            without difficulty. The patient tolerated the                            procedure well. The quality of the bowel  preparation was good. The ileocecal valve,                            appendiceal orifice, and rectum were photographed. Scope In: 9:58:08 AM Scope Out: 10:27:42 AM Scope Withdrawal Time: 0 hours 23 minutes 46 seconds  Total Procedure Duration: 0 hours 29 minutes 34 seconds  Findings:      The digital rectal exam was normal.      The Simple Endoscopic Score for Crohn's Disease was determined based on       the endoscopic appearance of the mucosa in the following segments:       Segment score: 0.      - Right Colon: Findings include no ulcers present, no ulcerated       surfaces, no affected surfaces and no narrowings. Segment score: 0.      - Transverse Colon: Findings include no ulcers present, no ulcerated       surfaces, no affected surfaces and no narrowings. Segment score: 0.      - Left Colon: Findings include no ulcers present, no ulcerated surfaces,       no affected surfaces and no narrowings. Segment score: 0.      - Rectum: Findings include no ulcers  present, no ulcerated surfaces, no       affected surfaces and no narrowings. Segment score: 0.      - Total SES-CD aggregate score: 0. Four biopsies were taken every 10 cm       with a cold forceps from the right colon and left colon for Crohn's       disease surveillance and dysplasia surveillance. These biopsy specimens       from the right colon and left colon were sent to Pathology.      A 3 mm polyp was found in the cecum. The polyp was sessile. The polyp       was removed with a cold biopsy forceps. Resection and retrieval were       complete.      A 6 mm polyp was found in the cecum. The polyp was sessile. The polyp       was removed with a cold snare. Resection and retrieval were complete.      Two sessile polyps were found in the descending colon. The polyps were 5       to 6 mm in size. These polyps were removed with a cold snare. Resection       and retrieval were complete.      A 6 mm polyp was found in the sigmoid colon. The polyp was sessile. The       polyp was removed with a cold snare. Resection and retrieval were       complete.      A 10 mm polyp was found in the proximal sigmoid colon. The polyp was       pedunculated. The polyp was removed with a hot snare. Resection and       retrieval were complete. To prevent bleeding after the polypectomy, two       hemostatic clips were successfully placed (MR conditional). There was no       bleeding during, or at the end, of the procedure.      Internal hemorrhoids were found during retroflexion. The hemorrhoids       were small. Impression:               -  Simple Endoscopic Score for Crohn's Disease: 0,                            no macroscopic mucosal inflammatory changes                            secondary to Crohn's disease. Colonic Crohn's in                            remission. Biopsied.                           - One 3 mm polyp in the cecum, removed with a cold                            biopsy forceps. Resected and  retrieved.                           - One 6 mm polyp in the cecum, removed with a cold                            snare. Resected and retrieved.                           - Two 5 to 6 mm polyps in the descending colon,                            removed with a cold snare. Resected and retrieved.                           - One 6 mm polyp in the sigmoid colon, removed with                            a cold snare. Resected and retrieved.                           - One 10 mm polyp in the proximal sigmoid colon,                            removed with a hot snare. Resected and retrieved.                            Clips (MR conditional) were placed x 2 given DAPT                            need.                           - Small internal hemorrhoids. Moderate Sedation:      N/A Recommendation:           - Patient has a contact number available for  emergencies. The signs and symptoms of potential                            delayed complications were discussed with the                            patient. Return to normal activities tomorrow.                            Written discharge instructions were provided to the                            patient.                           - Resume previous diet.                           - Continue present medications.                           - Resume Plavix (clopidogrel) at prior dose                            tomorrow. Refer to managing physician for further                            adjustment of therapy.                           - Await pathology results.                           - Repeat colonoscopy is recommended for                            surveillance. The colonoscopy date will be                            determined after pathology results from today's                            exam become available for review. Procedure Code(s):        --- Professional ---                           [phone], Colonoscopy,  flexible; with removal of                            tumor(s), polyp(s), or other lesion(s) by snare                            technique                           [id number], 59, Colonoscopy, flexible; with biopsy,  single or multiple Diagnosis Code(s):        --- Professional ---                           K64.8, Other hemorrhoids                           D12.0, Benign neoplasm of cecum                           D12.5, Benign neoplasm of sigmoid colon                           D12.4, Benign neoplasm of descending colon                           K50.80, Crohn's disease of both small and large                            intestine without complications CPT copyright 2022 [employer]. All rights reserved. The codes documented in this report are preliminary and upon coder review may  be revised to meet current compliance requirements. Beverley Fiedler, MD 01/12/2023 10:46:17 AM This report has been signed electronically. Number of Addenda: 1 Addendum Number: 1   Addendum Date: 01/14/2023 3:39:42 PM      It should be noted Entyvio is every 4 weeks (not 6 weeks). Continue       current schedule Beverley Fiedler, MD 01/14/2023 3:40:11 PM This report has been signed electronically.

## 2023-01-12 NOTE — Discharge Instructions (Signed)

## 2023-01-12 NOTE — H&P (Signed)
GASTROENTEROLOGY PROCEDURE H&P NOTE   Primary Care Physician: Eustaquio Boyden, MD    Reason for Procedure:  Longstanding Crohn's disease of the small and large intestine and history of adenomatous colon polyp  Plan:    Colonoscopy  Patient is appropriate for endoscopic procedure(s) in the ambulatory ([facility]) setting.  The nature of the procedure, as well as the risks, benefits, and alternatives were carefully and thoroughly reviewed with the patient. Ample time for discussion and questions allowed. The patient understood, was satisfied, and agreed to proceed.     HPI: Jack [name] is a 57 y.o. male who presents for colonoscopy.  Medical history as below.  Tolerated the prep.  No recent chest pain or shortness of breath.  No abdominal pain today.  Plavix on hold x 5 days.  Past Medical History:  Diagnosis Date   Acquired renal cyst of right kidney 08/31/2018   2.8cm R upper pole rec monitor with yearly imaging (09/2018)   Allergy    BPH (benign prostatic hypertrophy)    CAD (coronary artery disease)    a. anterior STEMI 01/2018 -  proximal occlusion of LAD, treated with DES. Cath also showed 20% distal LM, 95% ostial-prox small-moderate ramus, 70-80% ostial Cx, 70% dominant ostial OM, 50-60% prox Cx, 70% RCA. EF 35% by cath with LVEDP . Med rx for residual disease. Course complicated by post MI pericarditis and LV thrombus.   Chronic systolic (congestive) heart failure (HCC)    Chronic systolic HF (heart failure) (HCC) 08/07/2021   Colitis    Colon polyp    inflammatory   Crohn disease (HCC) 1992   history uveitis, involvement of intestines and lungs   Diverticulosis    Eosinophilic granuloma (HCC)    Essential hypertension    GERD (gastroesophageal reflux disease)    GI bleeding    History of chicken pox    History of gastroesophageal reflux (GERD)    History of seizure 1995   grand mal x1, completed 6 yrs dilantin. no seizures since   Hyperlipidemia     Internal hemorrhoids    Ischemic cardiomyopathy    LV (left ventricular) mural thrombus following MI (HCC) 01/2018   Myocardial infarction (HCC)    Osteoporosis 11/2015   DEXA T -2.9   Prostate cancer (HCC) 2021   NO TX   Schwannoma 2007   L axilla s/p surgery   Seizures (HCC)    last seizure 1995 and only x 1 seizure-    Ulnar neuropathy    h/o this from L arm schwannoma    Past Surgical History:  Procedure Laterality Date   APPENDECTOMY  2000   BACK SURGERY  2011   lumbar   CARDIAC CATHETERIZATION     CARDIOVASCULAR STRESS TEST  01/2015   low risk study   CHOLECYSTECTOMY  2000   COLONOSCOPY  08/2014   f/u crohn's, 4 inflammatory polyps, diverticulosis, improved, rpt 2 yrs ([place])   COLONOSCOPY  11/2018   multiple polyps, 1 TA, rpt 2 yrs (Shamarion Coots)   COLONOSCOPY WITH PROPOFOL N/A 04/09/2018   inflammatory polyp, f/u left to primary GI - Gicela Schwarting Myrtie Neither, Andreas Blower, MD)   CORONARY ANGIOGRAPHY N/A 02/21/2018   Procedure: CORONARY ANGIOGRAPHY;  Surgeon: Lyn Records, MD;  Location: [facility] INVASIVE CV LAB;  Service: Cardiovascular;  Laterality: N/A;   CORONARY STENT INTERVENTION N/A 08/06/2021   Procedure: CORONARY STENT INTERVENTION;  Surgeon: Lyn Records, MD;  Location: [facility] INVASIVE CV LAB;  Service: Cardiovascular;  Laterality: N/A;  CORONARY/GRAFT ACUTE MI REVASCULARIZATION N/A 02/20/2018   Procedure: Coronary/Graft Acute MI Revascularization;  Surgeon: Lyn Records, MD;  Location: [facility] INVASIVE CV LAB;  Service: Cardiovascular;  Laterality: N/A;   ESOPHAGOGASTRODUODENOSCOPY  07/2014   h/o EE resolved, focal reflux esophagitis, chronic active gastritis   ESOPHAGOGASTRODUODENOSCOPY  11/2018   gastritis, no active crohn's ([place]ah Carducci)   extremity surgery Left    ulnar nerve repair after schwannoma removal   FLEXIBLE SIGMOIDOSCOPY N/A 04/14/2018   Procedure: FLEXIBLE SIGMOIDOSCOPY;  Surgeon: Rachael Fee, MD;  Location: [facility] ENDOSCOPY;  Service: Endoscopy;  Laterality: N/A;   ICD  IMPLANT N/A 06/23/2022   Procedure: ICD IMPLANT;  Surgeon: Marinus Maw, MD;  Location: [facility] INVASIVE CV LAB;  Service: Cardiovascular;  Laterality: N/A;   INGUINAL HERNIA REPAIR Bilateral 2017   LAMINECTOMY N/A 08/09/2018   Procedure: Lumbar three Laminectomy, excision of intradural tumor;  Surgeon: Barnett Abu, MD;  Location: [facility] OR;  Service: Neurosurgery;  Laterality: N/A;   LEFT HEART CATH AND CORONARY ANGIOGRAPHY N/A 02/20/2018   Procedure: LEFT HEART CATH AND CORONARY ANGIOGRAPHY;  Surgeon: Lyn Records, MD;  Location: [facility] INVASIVE CV LAB;  Service: Cardiovascular;  Laterality: N/A;   LEFT HEART CATH AND CORONARY ANGIOGRAPHY N/A 08/06/2021   Procedure: LEFT HEART CATH AND CORONARY ANGIOGRAPHY;  Surgeon: Lyn Records, MD;  Location: [facility] INVASIVE CV LAB;  Service: Cardiovascular;  Laterality: N/A;   LUMBAR SPINE SURGERY  2011   POLYPECTOMY  04/09/2018   Procedure: POLYPECTOMY;  Surgeon: Sherrilyn Rist, MD;  Location: [facility] ENDOSCOPY;  Service: Gastroenterology;;   SPINAL CORD STIMULATOR IMPLANT  2012   x2 (Dr Gretta Arab)   SUBMUCOSAL INJECTION  04/09/2018   Procedure: SUBMUCOSAL INJECTION;  Surgeon: Sherrilyn Rist, MD;  Location: [facility] ENDOSCOPY;  Service: Gastroenterology;;   TONSILLECTOMY  1996   TUMOR REMOVAL Left 2007   schwannoma from L armpit   UPPER GASTROINTESTINAL ENDOSCOPY      Prior to Admission medications   Medication Sig Start Date End Date Taking? Authorizing Provider  acetaminophen (TYLENOL) 325 MG tablet Take 2 tablets (650 mg total) by mouth every 4 (four) hours as needed for headache or mild pain. 08/07/21  Yes Leone Brand, NP  amitriptyline (ELAVIL) 25 MG tablet Take 1 tablet (25 mg total) by mouth at bedtime as needed for sleep. 01/27/22  Yes Eustaquio Boyden, MD  aspirin EC 81 MG tablet Take 81 mg by mouth daily.   Yes [provider]  atorvastatin (LIPITOR) 80 MG tablet TAKE 1 TABLET BY MOUTH DAILY AT 6 PM. 07/23/22  Yes Corky Crafts, MD   azaTHIOprine (IMURAN) 50 MG tablet TAKE 3 TABLETS BY MOUTH AT BEDTIME. 12/02/22  Yes Granvel Proudfoot, Carie Caddy, MD  Calcium Carb-Cholecalciferol (CALCIUM + VITAMIN D3 PO) Take 2 tablets by mouth daily.   Yes [provider]  carvedilol (COREG) 3.125 MG tablet TAKE 1 TABLET BY MOUTH 2 TIMES DAILY. 06/30/22  Yes Marinus Maw, MD  dapagliflozin propanediol (FARXIGA) 10 MG TABS tablet Take 1 tablet (10 mg total) by mouth daily before breakfast. 06/06/22  Yes Corky Crafts, MD  ezetimibe (ZETIA) 10 MG tablet Take 1 tablet (10 mg total) by mouth daily. 10/28/22  Yes Marinus Maw, MD  finasteride (PROSCAR) 5 MG tablet Take 1 tablet (5 mg total) by mouth daily. 04/28/22  Yes   pantoprazole (PROTONIX) 40 MG tablet Take 1 tablet (40 mg total) by mouth daily. 04/21/22  Yes Eustaquio Boyden, MD  predniSONE (DELTASONE) 1 MG tablet Take 2 tablets (2 mg total) by mouth daily. 11/12/22  Yes   predniSONE (DELTASONE) 10 MG tablet Take 1 tablet (10 mg total) by mouth daily with breakfast. 12/11/22 03/11/23 Yes Shakera Ebrahimi, Carie Caddy, MD  predniSONE (DELTASONE) 5 MG tablet Take 1 tablet (5 mg total) by mouth daily. 04/23/22  Yes   sacubitril-valsartan (ENTRESTO) 24-26 MG Take 1 tablet by mouth 2 (two) times daily. 08/20/22  Yes Corky Crafts, MD  silodosin (RAPAFLO) 8 MG CAPS capsule Take 1 capsule (8 mg total) by mouth daily. 07/01/22  Yes   spironolactone (ALDACTONE) 25 MG tablet Take 1/2 tablet (12.5 mg total) by mouth every Monday, Wednesday, and Friday. 05/30/22  Yes Corky Crafts, MD  tadalafil (CIALIS) 5 MG tablet Take 1 tablet (5 mg total) by mouth daily. 12/10/22  Yes   alendronate (FOSAMAX) 70 MG tablet Take 1 tablet (70 mg total) by mouth every 7 (seven) days. Patient taking differently: Take 70 mg by mouth every 7 (seven) days. On Saturday 11/03/22     clopidogrel (PLAVIX) 75 MG tablet Take 1 tablet (75 mg total) by mouth daily. 06/30/22   Corky Crafts, MD  diphenoxylate-atropine (LOMOTIL) 2.5-0.025 MG  tablet Take 1 tablet by mouth 4 (four) times daily as needed for diarrhea or loose stools.    [provider]  icosapent Ethyl (VASCEPA) 1 g capsule Take 2 capsules (2 g total) by mouth 2 (two) times daily. 07/07/22   Corky Crafts, MD  nitroGLYCERIN (NITROSTAT) 0.4 MG SL tablet Place 1 tablet (0.4 mg total) under the tongue every 5 (five) minutes as needed for chest pain. 08/07/21   Leone Brand, NP  OVER THE COUNTER MEDICATION Apply 1 Application topically daily. Athlete's foot cream Patient not taking: Reported on 12/29/2022    [provider]  tadalafil (CIALIS) 20 MG tablet Take 1 tablet (20 mg total) by mouth daily as needed for erectile dysfunction. Patient not taking: Reported on 12/29/2022 01/27/22   Eustaquio Boyden, MD  vedolizumab (ENTYVIO) 300 MG injection Inject 300 mg into the vein every 28 (twenty-eight) days.    [provider]  verapamil (CALAN-SR) 120 MG CR tablet Take 1 tablet (120 mg total) by mouth 2 (two) times daily for 2 days. As needed for cluster headaches Patient not taking: Reported on 12/29/2022 11/17/22 11/19/22  Eustaquio Boyden, MD    No current facility-administered medications for this encounter.   Facility-Administered Medications Ordered in Other Encounters  Medication Dose Route Frequency Provider Last Rate Last Admin   ondansetron (ZOFRAN) 4 mg in sodium chloride 0.9 % 50 mL IVPB  4 mg Intravenous Q6H PRN Barnett Abu, MD        Allergies as of 10/16/2022 - Review Complete 09/25/2022  Allergen Reaction Noted   Cymbalta [duloxetine hcl] Other (See Comments)    Infliximab  03/02/2016   Keflex [cephalexin] Nausea And Vomiting 01/28/2014    Family History  Problem Relation Age of Onset   Heart disease Mother    CAD Mother 105       CABG   Congenital heart disease Mother    Hypertension Mother    Hyperlipidemia Mother    Heart disease Father    CAD Father 45       CABG   Hypertension Father    Hyperlipidemia Father     Crohn's disease Brother    Colon polyps Maternal Grandmother    Colon cancer Maternal Grandmother 33   Diabetes  Neg Hx    Esophageal cancer Neg Hx    Rectal cancer Neg Hx    Stomach cancer Neg Hx     Social History   Socioeconomic History   Marital status: Married    Spouse name: Vernona Rieger   Number of children: 2   Years of education: 16   Highest education level: Bachelor's degree (e.g., BA, AB, BS)  Occupational History   Occupation: Self-employed    Comment: Patient owns 2 mattress stores  Tobacco Use   Smoking status: Some Days    Types: Cigars    Passive exposure: Never   Smokeless tobacco: Never  Vaping Use   Vaping status: Never Used  Substance and Sexual Activity   Alcohol use: Yes    Alcohol/week: 2.0 standard drinks of alcohol    Types: 2 Standard drinks or equivalent per week    Comment: Occasional   Drug use: No   Sexual activity: Not on file  Other Topics Concern   Not on file  Social History Narrative   Lives with wife, 1 dog. Grown children   Edu: college   Occ: owns mattress store   Activity: active at work, started [employer], wants to restart running   Diet: good water, fruits/vegetables daily   Social Determinants of Health   Financial Resource Strain: Low Risk  (05/11/2018)   Overall Financial Resource Strain (CARDIA)    Difficulty of Paying Living Expenses: Not hard at all  Food Insecurity: No Food Insecurity (02/13/2022)   Hunger Vital Sign    Worried About Running Out of Food in the Last Year: Never true    Ran Out of Food in the Last Year: Never true  Transportation Needs: No Transportation Needs (02/13/2022)   PRAPARE - Administrator, Civil Service (Medical): No    Lack of Transportation (Non-Medical): No  Physical Activity: Inactive (05/11/2018)   Exercise Vital Sign    Days of Exercise per Week: 0 days    Minutes of Exercise per Session: 0 min  Stress: No Stress Concern Present (05/11/2018)   [employer] of  Occupational Health - Occupational Stress Questionnaire    Feeling of Stress : Only a little  Social Connections: Not on file  Intimate Partner Violence: Not At Risk (02/13/2022)   Humiliation, Afraid, Rape, and Kick questionnaire    Fear of Current or Ex-Partner: No    Emotionally Abused: No    Physically Abused: No    Sexually Abused: No    Physical Exam: Vital signs in last 24 hours: @BP  123/68   Pulse 85   Temp 97.8 F (36.6 C) (Temporal)   Resp 10   Ht 5\' 7"  (1.702 m)   Wt 73.5 kg   BMI 25.37 kg/m  GEN: NAD EYE: Sclerae anicteric ENT: MMM CV: Non-tachycardic Pulm: CTA b/l GI: Soft, NT/ND NEURO:  Alert & Oriented x 3   Erick Blinks, MD [facility]  01/12/2023 9:29 AM

## 2023-01-12 NOTE — Anesthesia Procedure Notes (Signed)
Procedure Name: MAC Date/Time: 01/12/2023 9:45 AM  Performed by: Orest Dikes, CRNAPre-anesthesia Checklist: Patient identified, Emergency Drugs available, Suction available and Patient being monitored Oxygen Delivery Method: Simple face mask

## 2023-01-12 NOTE — Transfer of Care (Signed)
Immediate Anesthesia Transfer of Care Note  Patient: Jack [name]  Procedure(s) Performed: COLONOSCOPY WITH PROPOFOL POLYPECTOMY BIOPSY HEMOSTASIS CLIP PLACEMENT  Patient Location: PACU and Endoscopy Unit  Anesthesia Type:MAC  Level of Consciousness: drowsy  Airway & Oxygen Therapy: Patient Spontanous Breathing and Patient connected to face mask oxygen  Post-op Assessment: Report given to RN and Post -op Vital signs reviewed and stable  Post vital signs: Reviewed and stable  Last Vitals:  Vitals Value Taken Time  BP    Temp    Pulse    Resp    SpO2      Last Pain:  Vitals:   01/12/23 0915  TempSrc: Temporal  PainSc: 0-No pain         Complications: No notable events documented.

## 2023-01-12 NOTE — Anesthesia Preprocedure Evaluation (Addendum)
Anesthesia Evaluation  Patient identified by MRN, date of birth, ID band Patient awake    Reviewed: Allergy & Precautions, H&P , NPO status , Patient's Chart, lab work & pertinent test results, reviewed documented beta blocker date and time   Airway Mallampati: I  TM Distance: <3 FB Neck ROM: Full   Comment: LOOKS ANTERIOR  Dental no notable dental hx. (+) Teeth Intact, Dental Advisory Given   Pulmonary sleep apnea , Current Smoker, former smoker   Pulmonary exam normal breath sounds clear to auscultation       Cardiovascular Exercise Tolerance: Good hypertension, Pt. on medications and Pt. on home beta blockers + angina  + CAD, + Past MI, + Cardiac Stents and +CHF   Rhythm:Regular Rate:Normal  Echo 24  1. Left ventricular ejection fraction, by estimation, is 30%. The left  ventricle has moderately decreased function. The left ventricle  demonstrates regional wall motion abnormalities (see scoring  diagram/findings for description) - aneurysmal change of  the left ventricular anteroseptum involving the anterior wall. There is  mild left ventricular hypertrophy. Left ventricular diastolic parameters  are consistent with Grade I diastolic dysfunction (impaired relaxation).   2. Right ventricular systolic function is normal. The right ventricular  size is normal. Tricuspid regurgitation signal is inadequate for assessing  PA pressure.   3. Left atrial size was mildly dilated.   4. The mitral valve is grossly normal. Mild mitral valve regurgitation.  No evidence of mitral stenosis.   5. The aortic valve is tricuspid. Aortic valve regurgitation is not  visualized. No aortic stenosis is present.   6. The inferior vena cava is normal in size with greater than 50%  respiratory variability, suggesting right atrial pressure of 3 mmHg.   Comparison(s): A prior study was performed on 06/28/21. Prior images  reviewed side by side. No  significant change since prior despite  differences in reporting.     Neuro/Psych  Headaches, Seizures -, Well Controlled,   Neuromuscular disease  negative psych ROS   GI/Hepatic Neg liver ROS,GERD  Medicated and Controlled,,  Endo/Other  negative endocrine ROS    Renal/GU Renal diseasenegative Renal ROS  negative genitourinary   Musculoskeletal   Abdominal   Peds  Hematology negative hematology ROS (+)   Anesthesia Other Findings   Reproductive/Obstetrics negative OB ROS                             Anesthesia Physical Anesthesia Plan  ASA: 3  Anesthesia Plan: MAC   Post-op Pain Management: Minimal or no pain anticipated   Induction: Intravenous  PONV Risk Score and Plan: 3 and Ondansetron and Propofol infusion  Airway Management Planned: Natural Airway, Simple Face Mask and Mask  Additional Equipment: None  Intra-op Plan:   Post-operative Plan: Extubation in OR  Informed Consent: I have reviewed the patients History and Physical, chart, labs and discussed the procedure including the risks, benefits and alternatives for the proposed anesthesia with the patient or authorized representative who has indicated his/her understanding and acceptance.     Dental advisory given  Plan Discussed with: CRNA and Anesthesiologist  Anesthesia Plan Comments:         Anesthesia Quick Evaluation

## 2023-01-12 NOTE — Anesthesia Postprocedure Evaluation (Signed)
Anesthesia Post Note  Patient: Tannon Peerson Buege  Procedure(s) Performed: COLONOSCOPY WITH PROPOFOL POLYPECTOMY BIOPSY HEMOSTASIS CLIP PLACEMENT     Patient location during evaluation: PACU Anesthesia Type: MAC Level of consciousness: awake and alert Pain management: pain level controlled Vital Signs Assessment: post-procedure vital signs reviewed and stable Respiratory status: spontaneous breathing, nonlabored ventilation, respiratory function stable and patient connected to nasal cannula oxygen Cardiovascular status: stable and blood pressure returned to baseline Postop Assessment: no apparent nausea or vomiting Anesthetic complications: no   No notable events documented.  Last Vitals:  Vitals:   01/12/23 1050 01/12/23 1100  BP: (!) 104/59 117/74  Pulse: 60 (!) 53  Resp: 14 17  Temp:    SpO2: 100% 92%    Last Pain:  Vitals:   01/12/23 1100  TempSrc:   PainSc: 0-No pain                 Winfield Caba

## 2023-01-13 ENCOUNTER — Other Ambulatory Visit (HOSPITAL_COMMUNITY): Payer: Self-pay

## 2023-01-13 ENCOUNTER — Ambulatory Visit: Payer: BC Managed Care – PPO | Admitting: Internal Medicine

## 2023-01-14 ENCOUNTER — Encounter (HOSPITAL_COMMUNITY): Payer: Self-pay | Admitting: Internal Medicine

## 2023-01-15 ENCOUNTER — Encounter: Payer: Self-pay | Admitting: Family Medicine

## 2023-01-15 ENCOUNTER — Other Ambulatory Visit: Payer: Self-pay

## 2023-01-15 LAB — SURGICAL PATHOLOGY

## 2023-01-16 ENCOUNTER — Other Ambulatory Visit (HOSPITAL_COMMUNITY): Payer: Self-pay

## 2023-01-22 ENCOUNTER — Encounter: Payer: Self-pay | Admitting: Internal Medicine

## 2023-01-23 ENCOUNTER — Other Ambulatory Visit: Payer: BC Managed Care – PPO

## 2023-01-23 NOTE — Plan of Care (Signed)
 [facility] Tonsillectomy/Adenoidectomy, Postoperative PEDS care plan entered in error.

## 2023-01-26 ENCOUNTER — Ambulatory Visit (INDEPENDENT_AMBULATORY_CARE_PROVIDER_SITE_OTHER): Payer: Commercial Managed Care - PPO

## 2023-01-26 VITALS — BP 113/65 | HR 65 | Temp 98.5°F | Resp 16 | Ht 67.0 in | Wt 169.2 lb

## 2023-01-26 DIAGNOSIS — K5080 Crohn's disease of both small and large intestine without complications: Secondary | ICD-10-CM

## 2023-01-26 MED ORDER — VEDOLIZUMAB 300 MG IV SOLR
300.0000 mg | Freq: Once | INTRAVENOUS | Status: AC
  Administered 2023-01-26: 300 mg via INTRAVENOUS
  Filled 2023-01-26: qty 5

## 2023-01-26 NOTE — Progress Notes (Signed)
Diagnosis: Crohn's Disease  Provider:  Chilton Greathouse MD  Procedure: IV Infusion  IV Type: Peripheral, IV Location: L Antecubital  Entyvio (Vedolizumab), Dose: 300 mg  Infusion Start Time: 0932  Infusion Stop Time: 1010  Post Infusion IV Care: Peripheral IV Discontinued  Discharge: Condition: Good, Destination: Home . AVS Declined  Performed by:  Nat Math, RN

## 2023-01-28 ENCOUNTER — Telehealth: Payer: Self-pay | Admitting: *Deleted

## 2023-01-28 NOTE — Telephone Encounter (Signed)
Patient Name: Jack [name]  DOB: [birth year] MRN: [id number]  Primary Cardiologist: Lance Muss, MD  Chart reviewed as part of pre-operative protocol coverage.  Simple dental extractions (i.e. 1-2 teeth) are considered low risk procedures per guidelines and generally do not require any specific cardiac clearance. It is also generally accepted that for simple extractions and dental cleanings, there is no need to interrupt blood thinner therapy.  SBE prophylaxis is not  required for the patient from a cardiac standpoint.  I will route this recommendation to the requesting party via Epic fax function and remove from pre-op pool.  Please call with questions.  Napoleon Form, Leodis Rains, NP 01/28/2023, 12:43 PM

## 2023-01-28 NOTE — Telephone Encounter (Signed)
Pre-operative Risk Assessment    Patient Name: Jack [name]  DOB: 1966-02-03 MRN: [id number]    DATE OF LAST VISIT: 09/25/22 DR. Ladona Ridgel DATE OF NEXT VISIT: NONE  Request for Surgical Clearance    Procedure:  Dental Extraction - Amount of Teeth to be Pulled:  1 SURGICAL EXTRACTION  Date of Surgery:  Clearance TBD                                 Surgeon:  DR. [facility] Surgeon's Group or Practice Name:  [facility] Phone number:  [phone] Fax number:  [phone]   Type of Clearance Requested:   - Medical  - Pharmacy:  Hold Aspirin and Clopidogrel (Plavix)     Type of Anesthesia:  Local  WITH EPI   Additional requests/questions:    Elpidio Anis   01/28/2023, 12:31 PM

## 2023-01-30 ENCOUNTER — Encounter: Payer: BC Managed Care – PPO | Admitting: Family Medicine

## 2023-02-02 ENCOUNTER — Other Ambulatory Visit: Payer: Self-pay | Admitting: Family Medicine

## 2023-02-02 ENCOUNTER — Other Ambulatory Visit (HOSPITAL_COMMUNITY): Payer: Self-pay

## 2023-02-02 ENCOUNTER — Encounter: Payer: Self-pay | Admitting: Internal Medicine

## 2023-02-02 MED ORDER — AMITRIPTYLINE HCL 25 MG PO TABS
25.0000 mg | ORAL_TABLET | Freq: Every evening | ORAL | 1 refills | Status: DC | PRN
  Filled 2023-02-02: qty 90, 90d supply, fill #0
  Filled 2023-05-04: qty 90, 90d supply, fill #1

## 2023-02-03 ENCOUNTER — Other Ambulatory Visit: Payer: Self-pay | Admitting: Family Medicine

## 2023-02-03 DIAGNOSIS — E785 Hyperlipidemia, unspecified: Secondary | ICD-10-CM

## 2023-02-03 DIAGNOSIS — C61 Malignant neoplasm of prostate: Secondary | ICD-10-CM

## 2023-02-03 DIAGNOSIS — K5080 Crohn's disease of both small and large intestine without complications: Secondary | ICD-10-CM

## 2023-02-03 DIAGNOSIS — N1831 Chronic kidney disease, stage 3a: Secondary | ICD-10-CM

## 2023-02-03 DIAGNOSIS — R739 Hyperglycemia, unspecified: Secondary | ICD-10-CM

## 2023-02-03 DIAGNOSIS — M818 Other osteoporosis without current pathological fracture: Secondary | ICD-10-CM

## 2023-02-03 DIAGNOSIS — D72119 Hypereosinophilic syndrome (hes), unspecified: Secondary | ICD-10-CM

## 2023-02-04 ENCOUNTER — Other Ambulatory Visit (INDEPENDENT_AMBULATORY_CARE_PROVIDER_SITE_OTHER): Payer: Commercial Managed Care - PPO

## 2023-02-04 DIAGNOSIS — M818 Other osteoporosis without current pathological fracture: Secondary | ICD-10-CM | POA: Diagnosis not present

## 2023-02-04 DIAGNOSIS — E785 Hyperlipidemia, unspecified: Secondary | ICD-10-CM

## 2023-02-04 DIAGNOSIS — D72119 Hypereosinophilic syndrome (hes), unspecified: Secondary | ICD-10-CM

## 2023-02-04 DIAGNOSIS — N1831 Chronic kidney disease, stage 3a: Secondary | ICD-10-CM

## 2023-02-04 DIAGNOSIS — R739 Hyperglycemia, unspecified: Secondary | ICD-10-CM

## 2023-02-04 DIAGNOSIS — K5080 Crohn's disease of both small and large intestine without complications: Secondary | ICD-10-CM

## 2023-02-04 LAB — COMPREHENSIVE METABOLIC PANEL
ALT: 39 U/L (ref 0–53)
AST: 23 U/L (ref 0–37)
Albumin: 3.8 g/dL (ref 3.5–5.2)
Alkaline Phosphatase: 44 U/L (ref 39–117)
BUN: 25 mg/dL — ABNORMAL HIGH (ref 6–23)
CO2: 31 meq/L (ref 19–32)
Calcium: 8.8 mg/dL (ref 8.4–10.5)
Chloride: 104 meq/L (ref 96–112)
Creatinine, Ser: 1.17 mg/dL (ref 0.40–1.50)
GFR: 70.18 mL/min (ref >60.00)
Glucose, Bld: 78 mg/dL (ref 70–99)
Potassium: 4.3 meq/L (ref 3.5–5.1)
Sodium: 140 meq/L (ref 135–145)
Total Bilirubin: 0.8 mg/dL (ref 0.2–1.2)
Total Protein: 6.4 g/dL (ref 6.0–8.3)

## 2023-02-04 LAB — LIPID PANEL
Cholesterol: 99 mg/dL (ref 0–200)
HDL: 36.6 mg/dL — ABNORMAL LOW (ref >39.00)
LDL Cholesterol: 47 mg/dL (ref 0–99)
NonHDL: 62.73
Total CHOL/HDL Ratio: 3
Triglycerides: 80 mg/dL (ref 0.0–149.0)
VLDL: 16 mg/dL (ref 0.0–40.0)

## 2023-02-04 LAB — CBC WITH DIFFERENTIAL/PLATELET
Basophils Absolute: 0.1 10*3/uL (ref 0.0–0.1)
Basophils Relative: 1.3 % (ref 0.0–3.0)
Eosinophils Absolute: 0.3 10*3/uL (ref 0.0–0.7)
Eosinophils Relative: 4.5 % (ref 0.0–5.0)
HCT: 44.5 % (ref 39.0–52.0)
Hemoglobin: 13.8 g/dL (ref 13.0–17.0)
Lymphocytes Relative: 34.8 % (ref 12.0–46.0)
Lymphs Abs: 2.6 10*3/uL (ref 0.7–4.0)
MCHC: 31.1 g/dL (ref 30.0–36.0)
MCV: 94.2 fL (ref 78.0–100.0)
Monocytes Absolute: 1.1 10*3/uL — ABNORMAL HIGH (ref 0.1–1.0)
Monocytes Relative: 15.3 % — ABNORMAL HIGH (ref 3.0–12.0)
Neutro Abs: 3.3 10*3/uL (ref 1.4–7.7)
Neutrophils Relative %: 44.1 % (ref 43.0–77.0)
Platelets: 286 10*3/uL (ref 150.0–400.0)
RBC: 4.72 Mil/uL (ref 4.22–5.81)
RDW: 17.6 % — ABNORMAL HIGH (ref 11.5–15.5)
WBC: 7.4 10*3/uL (ref 4.0–10.5)

## 2023-02-04 LAB — MICROALBUMIN / CREATININE URINE RATIO
Creatinine,U: 153.7 mg/dL
Microalb Creat Ratio: 0.7 mg/g (ref 0.0–30.0)
Microalb, Ur: 1.1 mg/dL (ref 0.0–1.9)

## 2023-02-04 LAB — HEMOGLOBIN A1C: Hgb A1c MFr Bld: 5.9 % (ref 4.6–6.5)

## 2023-02-04 LAB — SEDIMENTATION RATE: Sed Rate: 13 mm/h (ref 0–20)

## 2023-02-04 LAB — PHOSPHORUS: Phosphorus: 3.7 mg/dL (ref 2.3–4.6)

## 2023-02-04 LAB — VITAMIN D 25 HYDROXY (VIT D DEFICIENCY, FRACTURES): VITD: 44.32 ng/mL (ref 30.00–100.00)

## 2023-02-05 LAB — PARATHYROID HORMONE, INTACT (NO CA): PTH: 46 pg/mL (ref 16–77)

## 2023-02-09 ENCOUNTER — Other Ambulatory Visit (HOSPITAL_COMMUNITY): Payer: Self-pay

## 2023-02-09 ENCOUNTER — Other Ambulatory Visit: Payer: Self-pay | Admitting: Family Medicine

## 2023-02-09 ENCOUNTER — Other Ambulatory Visit: Payer: Self-pay

## 2023-02-09 MED ORDER — PANTOPRAZOLE SODIUM 40 MG PO TBEC
40.0000 mg | DELAYED_RELEASE_TABLET | Freq: Every day | ORAL | 0 refills | Status: DC
  Filled 2023-02-09: qty 90, 90d supply, fill #0

## 2023-02-09 MED FILL — Carvedilol Tab 3.125 MG: ORAL | 90 days supply | Qty: 180 | Fill #1 | Status: AC

## 2023-02-10 DIAGNOSIS — H2513 Age-related nuclear cataract, bilateral: Secondary | ICD-10-CM | POA: Diagnosis not present

## 2023-02-10 DIAGNOSIS — H524 Presbyopia: Secondary | ICD-10-CM | POA: Diagnosis not present

## 2023-02-11 ENCOUNTER — Ambulatory Visit: Payer: Commercial Managed Care - PPO | Admitting: Family Medicine

## 2023-02-11 ENCOUNTER — Encounter: Payer: Self-pay | Admitting: Family Medicine

## 2023-02-11 VITALS — BP 122/80 | HR 76 | Temp 97.9°F | Ht 67.0 in | Wt 171.0 lb

## 2023-02-11 DIAGNOSIS — Z7952 Long term (current) use of systemic steroids: Secondary | ICD-10-CM

## 2023-02-11 DIAGNOSIS — R350 Frequency of micturition: Secondary | ICD-10-CM

## 2023-02-11 DIAGNOSIS — M818 Other osteoporosis without current pathological fracture: Secondary | ICD-10-CM

## 2023-02-11 DIAGNOSIS — C61 Malignant neoplasm of prostate: Secondary | ICD-10-CM

## 2023-02-11 DIAGNOSIS — D7218 Eosinophilia in diseases classified elsewhere: Secondary | ICD-10-CM

## 2023-02-11 DIAGNOSIS — Z23 Encounter for immunization: Secondary | ICD-10-CM | POA: Diagnosis not present

## 2023-02-11 DIAGNOSIS — N401 Enlarged prostate with lower urinary tract symptoms: Secondary | ICD-10-CM

## 2023-02-11 DIAGNOSIS — N281 Cyst of kidney, acquired: Secondary | ICD-10-CM | POA: Diagnosis not present

## 2023-02-11 DIAGNOSIS — I10 Essential (primary) hypertension: Secondary | ICD-10-CM

## 2023-02-11 DIAGNOSIS — D72119 Hypereosinophilic syndrome (hes), unspecified: Secondary | ICD-10-CM

## 2023-02-11 DIAGNOSIS — I2510 Atherosclerotic heart disease of native coronary artery without angina pectoris: Secondary | ICD-10-CM

## 2023-02-11 DIAGNOSIS — K5080 Crohn's disease of both small and large intestine without complications: Secondary | ICD-10-CM

## 2023-02-11 DIAGNOSIS — E785 Hyperlipidemia, unspecified: Secondary | ICD-10-CM | POA: Diagnosis not present

## 2023-02-11 DIAGNOSIS — Z0000 Encounter for general adult medical examination without abnormal findings: Secondary | ICD-10-CM | POA: Diagnosis not present

## 2023-02-11 DIAGNOSIS — N1831 Chronic kidney disease, stage 3a: Secondary | ICD-10-CM | POA: Diagnosis not present

## 2023-02-11 DIAGNOSIS — E7841 Elevated Lipoprotein(a): Secondary | ICD-10-CM

## 2023-02-11 DIAGNOSIS — Z955 Presence of coronary angioplasty implant and graft: Secondary | ICD-10-CM

## 2023-02-11 DIAGNOSIS — M301 Polyarteritis with lung involvement [Churg-Strauss]: Secondary | ICD-10-CM

## 2023-02-11 DIAGNOSIS — I5020 Unspecified systolic (congestive) heart failure: Secondary | ICD-10-CM

## 2023-02-11 MED ORDER — CALCIUM CARB-CHOLECALCIFEROL 600-5 MG-MCG PO TABS
1.0000 | ORAL_TABLET | Freq: Every day | ORAL | Status: AC
Start: 2023-02-11 — End: ?

## 2023-02-11 NOTE — Progress Notes (Signed)
Ph: [phone] Fax: [phone]   Patient ID: Jack [name], male    DOB: [birth year], 57 y.o.   MRN: [id number]  This visit was conducted in person.  BP 122/80 (BP Location: Left Arm, Patient Position: Sitting, Cuff Size: Large)   Pulse 76   Temp 97.9 F (36.6 C) (Oral)   Ht 5\' 7"  (1.702 m)   Wt 171 lb (77.6 kg)   SpO2 98%   BMI 26.78 kg/m    CC: CPE Subjective:   HPI: [name] is a 57 y.o. male presenting on 02/11/2023 for Annual Exam (Nonfasting)   Known hypereosinophilic syndrome (churg strauss and EoE), seizure disorder, crohn's disease, and CAD s/p STEMI of LAD 01/2018 s/p DES with resultant ischemic cardiomyopathy ER 35% with LV thrombus s/p coumadin now only on plavix due to GI bleed. Had further coronary revascularization 07/2021. He did have lumbar spine schwannoma removed ([facility]).   Ischemic CM/CHF with EF 30% with ICD in place. Sees EP Dr Ladona Ridgel and cards Dr Eldridge Dace. Has declined sleep study.    Crohn's disease - followed by GI Dr Rhea Belton. Managed with Entyvio 300mg  Q4 wks and azathioprine 150mg  daily. Latest colonoscopy 12/2022.    Sees [facility] ENT, and pulmonology.  Sees [place] rheumatology Kathi Ludwig). Planning to transition to Dr Dimple Casey at [facility] Rheum - continues prednisone daily currently 16mg  daily - hasn't been able to wean past 15mg . Sees rheum yearly.   Possible cluster HAs improved when he quit drinking bourbon.    Preventative: COLONOSCOPY during hospitalization 01/2015 - active ileitis/colitis, f/u 2 yrs ([place]). Has established with Dr Rhea Belton COLONOSCOPY WITH PROPOFOL 04/09/2018 - inflammatory polyp, f/u left to primary GI - Pyrtle Myrtie Neither, Starr Lake III, MD) COLONOSCOPY 11/2018 multiple polyps, 1 TA, rpt 2 yrs (Pyrtle) Colonoscopy 12/2022 - SSP, chronic active colitis, inflamamtory polyp, rpt 3 yrs (Pyrtle, Carie Caddy, MD) Prostate cancer - sees uro Benancio Deeds) q6 mo, also on finasteride for BPH symptoms. Last seen 11/2022.  DEXA scan - 11/2015 T  -2.9 Osteoporosis. Takes calcium/vit D with fosamax.  DEXA 06/2017 - T -3.0 femoral neck [facility]).  DEXA 08/2019 - records requested - no records at [facility]. He continues Fosamax weekly.  Lung cancer screen - not eligible  Flu shot yearly  COVID vaccine - Pfizer 05/2019, 06/2019, booster 02/2020 Tdap 09/2021 Pneumovax 2017, 07/2018, prevnar-13 12/2017 Shingrix - 06/2020, 09/2020 Seat belt use discussed Sunscreen use discussed. No changing moles on skin. Wants bump on back checked. Has seen [place] derm.  Sleep - averaging 7-8 hours/night  Non smoker. Rare cigar.  Alcohol - occasionally  Dentist q6 mo Eye exam yearly    Lives with wife, 1 dog. Grown children  Edu: college  Occ: owns mattress store  Activity: treadmill 5d/wk 30 min Diet: good water, fruits/vegetables daily      Relevant past medical, surgical, family and social history reviewed and updated as indicated. Interim medical history since our last visit reviewed. Allergies and medications reviewed and updated. Outpatient Medications Prior to Visit  Medication Sig Dispense Refill   acetaminophen (TYLENOL) 325 MG tablet Take 2 tablets (650 mg total) by mouth every 4 (four) hours as needed for headache or mild pain.     alendronate (FOSAMAX) 70 MG tablet Take 1 tablet (70 mg total) by mouth every 7 (seven) days. (Patient taking differently: Take 70 mg by mouth every 7 (seven) days. On Saturday) 12 tablet 0   amitriptyline (ELAVIL) 25 MG tablet Take 1 tablet (25 mg total)  by mouth at bedtime as needed for sleep. 90 tablet 1   aspirin EC 81 MG tablet Take 81 mg by mouth daily.     atorvastatin (LIPITOR) 80 MG tablet TAKE 1 TABLET BY MOUTH DAILY AT 6 PM. 90 tablet 3   azaTHIOprine (IMURAN) 50 MG tablet TAKE 3 TABLETS BY MOUTH AT BEDTIME. 270 tablet 0   carvedilol (COREG) 3.125 MG tablet TAKE 1 TABLET BY MOUTH 2 TIMES DAILY. 180 tablet 3   clopidogrel (PLAVIX) 75 MG tablet Take 1 tablet (75 mg total) by mouth daily. 90 tablet 3   dapagliflozin  propanediol (FARXIGA) 10 MG TABS tablet Take 1 tablet (10 mg total) by mouth daily before breakfast. 30 tablet 11   diphenoxylate-atropine (LOMOTIL) 2.5-0.025 MG tablet Take 1 tablet by mouth 4 (four) times daily as needed for diarrhea or loose stools.     ezetimibe (ZETIA) 10 MG tablet Take 1 tablet (10 mg total) by mouth daily. 90 tablet 3   finasteride (PROSCAR) 5 MG tablet Take 1 tablet (5 mg total) by mouth daily. 90 tablet 3   icosapent Ethyl (VASCEPA) 1 g capsule Take 2 capsules (2 g total) by mouth 2 (two) times daily. 120 capsule 11   nitroGLYCERIN (NITROSTAT) 0.4 MG SL tablet Place 1 tablet (0.4 mg total) under the tongue every 5 (five) minutes as needed for chest pain. 25 tablet 4   OVER THE COUNTER MEDICATION Apply 1 Application topically daily. Athlete's foot cream     pantoprazole (PROTONIX) 40 MG tablet Take 1 tablet (40 mg total) by mouth daily. 90 tablet 0   predniSONE (DELTASONE) 1 MG tablet Take 2 tablets (2 mg total) by mouth daily. 180 tablet 1   predniSONE (DELTASONE) 10 MG tablet Take 1 tablet (10 mg total) by mouth daily with breakfast. 90 tablet 0   predniSONE (DELTASONE) 5 MG tablet Take 1 tablet (5 mg total) by mouth daily. 90 tablet 3   sacubitril-valsartan (ENTRESTO) 24-26 MG Take 1 tablet by mouth 2 (two) times daily. 180 tablet 3   silodosin (RAPAFLO) 8 MG CAPS capsule Take 1 capsule (8 mg total) by mouth daily. 90 capsule 2   spironolactone (ALDACTONE) 25 MG tablet Take 1/2 tablet (12.5 mg total) by mouth every Monday, Wednesday, and Friday. 24 tablet 3   tadalafil (CIALIS) 20 MG tablet Take 1 tablet (20 mg total) by mouth daily as needed for erectile dysfunction. 20 tablet 6   tadalafil (CIALIS) 5 MG tablet Take 1 tablet (5 mg total) by mouth daily. 30 tablet 6   vedolizumab (ENTYVIO) 300 MG injection Inject 300 mg into the vein every 28 (twenty-eight) days.     Calcium Carb-Cholecalciferol (CALCIUM + VITAMIN D3 PO) Take 2 tablets by mouth daily.     verapamil  (CALAN-SR) 120 MG CR tablet Take 1 tablet (120 mg total) by mouth 2 (two) times daily for 2 days. As needed for cluster headaches (Patient not taking: Reported on 12/29/2022) 60 tablet 1   Facility-Administered Medications Prior to Visit  Medication Dose Route Frequency Provider Last Rate Last Admin   ondansetron (ZOFRAN) 4 mg in sodium chloride 0.9 % 50 mL IVPB  4 mg Intravenous Q6H PRN Barnett Abu, MD         Per HPI unless specifically indicated in ROS section below Review of Systems  Constitutional:  Negative for activity change, appetite change, chills, fatigue, fever and unexpected weight change.  HENT:  Negative for hearing loss.   Eyes:  Negative for visual  disturbance.  Respiratory:  Negative for cough, chest tightness, shortness of breath and wheezing.   Cardiovascular:  Negative for chest pain, palpitations and leg swelling.  Gastrointestinal:  Negative for abdominal distention, abdominal pain, blood in stool, constipation, diarrhea, nausea and vomiting.  Genitourinary:  Negative for difficulty urinating and hematuria.  Musculoskeletal:  Negative for arthralgias, myalgias and neck pain.  Skin:  Negative for rash.  Neurological:  Negative for dizziness, seizures, syncope and headaches.  Hematological:  Negative for adenopathy. Bruises/bleeds easily.  Psychiatric/Behavioral:  Negative for dysphoric mood. The patient is not nervous/anxious.     Objective:  BP 122/80 (BP Location: Left Arm, Patient Position: Sitting, Cuff Size: Large)   Pulse 76   Temp 97.9 F (36.6 C) (Oral)   Ht 5\' 7"  (1.702 m)   Wt 171 lb (77.6 kg)   SpO2 98%   BMI 26.78 kg/m   Wt Readings from Last 3 Encounters:  02/11/23 171 lb (77.6 kg)  01/26/23 169 lb 3.2 oz (76.7 kg)  01/12/23 162 lb (73.5 kg)      Physical Exam Vitals and nursing note reviewed.  Constitutional:      General: He is not in acute distress.    Appearance: Normal appearance. He is well-developed. He is not ill-appearing.   HENT:     Head: Normocephalic and atraumatic.     Right Ear: Hearing, tympanic membrane, ear canal and external ear normal.     Left Ear: Hearing, tympanic membrane, ear canal and external ear normal.     Mouth/Throat:     Mouth: Mucous membranes are moist.     Pharynx: Oropharynx is clear. No oropharyngeal exudate or posterior oropharyngeal erythema.  Eyes:     General: No scleral icterus.    Extraocular Movements: Extraocular movements intact.     Conjunctiva/sclera: Conjunctivae normal.     Pupils: Pupils are equal, round, and reactive to light.  Neck:     Thyroid: No thyroid mass or thyromegaly.  Cardiovascular:     Rate and Rhythm: Normal rate and regular rhythm.     Pulses: Normal pulses.          Radial pulses are 2+ on the right side and 2+ on the left side.     Heart sounds: Normal heart sounds. No murmur heard. Pulmonary:     Effort: Pulmonary effort is normal. No respiratory distress.     Breath sounds: Normal breath sounds. No wheezing, rhonchi or rales.  Abdominal:     General: Bowel sounds are normal. There is no distension.     Palpations: Abdomen is soft. There is no mass.     Tenderness: There is no abdominal tenderness. There is no guarding or rebound.     Hernia: No hernia is present.  Musculoskeletal:        General: Normal range of motion.     Cervical back: Normal range of motion and neck supple.     Right lower leg: No edema.     Left lower leg: No edema.  Lymphadenopathy:     Cervical: No cervical adenopathy.  Skin:    General: Skin is warm and dry.     Findings: No rash.  Neurological:     General: No focal deficit present.     Mental Status: He is alert and oriented to person, place, and time.  Psychiatric:        Mood and Affect: Mood normal.        Behavior: Behavior normal.  Thought Content: Thought content normal.        Judgment: Judgment normal.       Results for orders placed or performed in visit on 02/04/23  Hemoglobin A1c   Result Value Ref Range   Hgb A1c MFr Bld 5.9 4.6 - 6.5 %  Sedimentation rate  Result Value Ref Range   Sed Rate 13 0 - 20 mm/hr  CBC with Differential/Platelet  Result Value Ref Range   WBC 7.4 4.0 - 10.5 K/uL   RBC 4.72 4.22 - 5.81 Mil/uL   Hemoglobin 13.8 13.0 - 17.0 g/dL   HCT 16.1 09.6 - 04.5 %   MCV 94.2 78.0 - 100.0 fl   MCHC 31.1 30.0 - 36.0 g/dL   RDW 40.9 (H) 81.1 - 91.4 %   Platelets 286.0 150.0 - 400.0 K/uL   Neutrophils Relative % 44.1 43.0 - 77.0 %   Lymphocytes Relative 34.8 12.0 - 46.0 %   Monocytes Relative 15.3 (H) 3.0 - 12.0 %   Eosinophils Relative 4.5 0.0 - 5.0 %   Basophils Relative 1.3 0.0 - 3.0 %   Neutro Abs 3.3 1.4 - 7.7 K/uL   Lymphs Abs 2.6 0.7 - 4.0 K/uL   Monocytes Absolute 1.1 (H) 0.1 - 1.0 K/uL   Eosinophils Absolute 0.3 0.0 - 0.7 K/uL   Basophils Absolute 0.1 0.0 - 0.1 K/uL  Parathyroid hormone, intact (no Ca)  Result Value Ref Range   PTH 46 16 - 77 pg/mL  Microalbumin / creatinine urine ratio  Result Value Ref Range   Microalb, Ur 1.1 0.0 - 1.9 mg/dL   Creatinine,U 782.9 mg/dL   Microalb Creat Ratio 0.7 0.0 - 30.0 mg/g  VITAMIN D 25 Hydroxy (Vit-D Deficiency, Fractures)  Result Value Ref Range   VITD 44.32 30.00 - 100.00 ng/mL  Phosphorus  Result Value Ref Range   Phosphorus 3.7 2.3 - 4.6 mg/dL  Comprehensive metabolic panel  Result Value Ref Range   Sodium 140 135 - 145 mEq/L   Potassium 4.3 3.5 - 5.1 mEq/L   Chloride 104 96 - 112 mEq/L   CO2 31 19 - 32 mEq/L   Glucose, Bld 78 70 - 99 mg/dL   BUN 25 (H) 6 - 23 mg/dL   Creatinine, Ser 5.62 0.40 - 1.50 mg/dL   Total Bilirubin 0.8 0.2 - 1.2 mg/dL   Alkaline Phosphatase 44 39 - 117 U/L   AST 23 0 - 37 U/L   ALT 39 0 - 53 U/L   Total Protein 6.4 6.0 - 8.3 g/dL   Albumin 3.8 3.5 - 5.2 g/dL   GFR 13.08 >65.78 mL/min   Calcium 8.8 8.4 - 10.5 mg/dL  Lipid panel  Result Value Ref Range   Cholesterol 99 0 - 200 mg/dL   Triglycerides 46.9 0.0 - 149.0 mg/dL   HDL 62.95 (L) >28.41  mg/dL   VLDL 32.4 0.0 - 40.1 mg/dL   LDL Cholesterol 47 0 - 99 mg/dL   Total CHOL/HDL Ratio 3    NonHDL 62.73     Assessment & Plan:   Problem List Items Addressed This Visit     Essential hypertension (Chronic)    Chronic, stable on current regimen - continue.       Healthcare maintenance - Primary (Chronic)    Preventative protocols reviewed and updated unless pt declined. Discussed healthy diet and lifestyle.       Hyperlipidemia LDL goal <70 (Chronic)    Chronic, improving on current regimen of high potency atorvastatin,  Zetia, Vascepa. LDL goal <70.  The ASCVD Risk score (Arnett DK, et al., 2019) failed to calculate for the following reasons:   The patient has a prior MI or stroke diagnosis       HFrEF (heart failure with reduced ejection fraction) (HCC) (Chronic)    Appreciate cardiology care.       CAD (coronary artery disease) (Chronic)    STEMI to LAD 2019 - regularly sees cardiology      Crohn disease (HCC)    Appreciate GI care, recently had colonoscopy  Continues Entyvio infusions with azathioprine and prednisone       Benign prostatic hyperplasia    Followed by urology on finasteride, tadalafil and silodosin.       Hypereosinophilic syndrome    Transitioning rheumatologists - from [facility] to [facility] rheum  Continues prednisone 16mg  daily. Has never been able to taper past 15mg       Osteoporosis    I don't have access DEXA records from 2021 - possibly done by Dr Kathi Ludwig.  Continue fosamax.  Discussed cal/vit D dosing. In h/o CAD, rec drop cal supplements to 1 daily, try to get most calcium from diet, handout provided.      Relevant Medications   Calcium Carb-Cholecalciferol (CALCIUM + VITAMIN D3) 600-5 MG-MCG TABS   Churg-Strauss syndrome with lung involvement (HCC)   Status post insertion of drug-eluting stent into left anterior descending (LAD) artery for coronary artery disease   Acquired renal cyst of right kidney    Overdue for rpt  imaging - will order renal US as unable to complete MRI (h/o spinal cord stimulator and ICD).       Relevant Orders   US Renal   RESOLVED: CKD (chronic kidney disease) stage 3, GFR 30-59 ml/min    Latest GFR 70. Will remove from problem list.       Prostate cancer [facility])    Regularly sees urology  Q64mo On finasteride.       Elevated Lp(a)   On prednisone therapy   Other Visit Diagnoses     Immunization due       Relevant Orders   Flu vaccine trivalent PF, 6mos and older(Flulaval,Afluria,Fluarix,Fluzone) (Completed)        Meds ordered this encounter  Medications   Calcium Carb-Cholecalciferol (CALCIUM + VITAMIN D3) 600-5 MG-MCG TABS    Sig: Take 1 tablet by mouth daily.    Orders Placed This Encounter  Procedures   US Renal    Standing Status:   Future    Standing Expiration Date:   02/11/2024    Order Specific Question:   Reason for Exam (SYMPTOM  OR DIAGNOSIS REQUIRED)    Answer:   f/u R upper pole renal cyst    Order Specific Question:   Preferred imaging location?    Answer:   [facility]   Flu vaccine trivalent PF, 6mos and older(Flulaval,Afluria,Fluarix,Fluzone)    Patient Instructions  Flu shot today  Kidneys are doing well  Bump on back likely epidermal skin cyst - let us know if enlarging or becoming painful/red - concern for infection/inflammation.  Return as needed or in 1 year for next physical   Only take 1 calcium/vit D supplement daily.  Try to get most or all of your calcium from your food--aim for 1200 mg/day.   To figure out dietary calcium: 300 mg/day from all non dairy foods plus 300 mg per cup of milk, other dairy, or fortified juice.   Non  dairy foods that contain calcium: Kale, oranges, sardines, oatmeal, soy milk/soybeans, salmon, white beans, dried figs, turnip greens, almonds, broccoli, tofu.    Follow up plan: Return in about 1 year (around 02/11/2024) for follow up visit, annual exam, prior fasting for blood work.  Eustaquio Boyden, MD

## 2023-02-11 NOTE — Assessment & Plan Note (Signed)
Transitioning rheumatologists - from [facility] to [facility] rheum  Continues prednisone 16mg  daily. Has never been able to taper past 15mg 

## 2023-02-11 NOTE — Assessment & Plan Note (Signed)
Followed by urology on finasteride, tadalafil and silodosin.

## 2023-02-11 NOTE — Assessment & Plan Note (Signed)
Appreciate cardiology care.  °

## 2023-02-11 NOTE — Assessment & Plan Note (Addendum)
Chronic, improving on current regimen of high potency atorvastatin, Zetia, Vascepa. LDL goal <70.  The ASCVD Risk score (Arnett DK, et al., 2019) failed to calculate for the following reasons:   The patient has a prior MI or stroke diagnosis

## 2023-02-11 NOTE — Assessment & Plan Note (Signed)
I don't have access DEXA records from 2021 - possibly done by Dr Kathi Ludwig.  Continue fosamax.  Discussed cal/vit D dosing. In h/o CAD, rec drop cal supplements to 1 daily, try to get most calcium from diet, handout provided.

## 2023-02-11 NOTE — Assessment & Plan Note (Signed)
STEMI to LAD 2019 - regularly sees cardiology

## 2023-02-11 NOTE — Assessment & Plan Note (Signed)
Overdue for rpt imaging - will order renal US as unable to complete MRI (h/o spinal cord stimulator and ICD).

## 2023-02-11 NOTE — Assessment & Plan Note (Addendum)
Appreciate GI care, recently had colonoscopy  Continues Entyvio infusions with azathioprine and prednisone

## 2023-02-11 NOTE — Patient Instructions (Addendum)
Flu shot today  Kidneys are doing well  Bump on back likely epidermal skin cyst - let us know if enlarging or becoming painful/red - concern for infection/inflammation.  Return as needed or in 1 year for next physical   Only take 1 calcium/vit D supplement daily.  Try to get most or all of your calcium from your food--aim for 1200 mg/day.   To figure out dietary calcium: 300 mg/day from all non dairy foods plus 300 mg per cup of milk, other dairy, or fortified juice.   Non dairy foods that contain calcium: Kale, oranges, sardines, oatmeal, soy milk/soybeans, salmon, white beans, dried figs, turnip greens, almonds, broccoli, tofu.

## 2023-02-11 NOTE — Assessment & Plan Note (Signed)
Preventative protocols reviewed and updated unless pt declined. Discussed healthy diet and lifestyle.  

## 2023-02-11 NOTE — Assessment & Plan Note (Signed)
Latest GFR 70. Will remove from problem list.

## 2023-02-11 NOTE — Assessment & Plan Note (Addendum)
Regularly sees urology  Q67mo On finasteride.

## 2023-02-11 NOTE — Assessment & Plan Note (Signed)
Chronic, stable on current regimen - continue. 

## 2023-02-12 ENCOUNTER — Other Ambulatory Visit (HOSPITAL_COMMUNITY): Payer: Self-pay

## 2023-02-16 ENCOUNTER — Ambulatory Visit
Admission: RE | Admit: 2023-02-16 | Discharge: 2023-02-16 | Disposition: A | Discharge status: Home / Self Care | Payer: Commercial Managed Care - PPO | Source: Ambulatory Visit | Attending: Family Medicine | Admitting: Family Medicine

## 2023-02-16 DIAGNOSIS — N281 Cyst of kidney, acquired: Secondary | ICD-10-CM | POA: Diagnosis not present

## 2023-02-23 ENCOUNTER — Ambulatory Visit: Payer: Commercial Managed Care - PPO | Admitting: *Deleted

## 2023-02-23 VITALS — BP 121/72 | HR 68 | Temp 97.9°F | Resp 18 | Ht 67.0 in | Wt 168.8 lb

## 2023-02-23 DIAGNOSIS — K5080 Crohn's disease of both small and large intestine without complications: Secondary | ICD-10-CM | POA: Diagnosis not present

## 2023-02-23 MED ORDER — SODIUM CHLORIDE 0.9 % IV SOLN
300.0000 mg | Freq: Once | INTRAVENOUS | Status: AC
  Administered 2023-02-23: 300 mg via INTRAVENOUS
  Filled 2023-02-23: qty 5

## 2023-02-23 NOTE — Progress Notes (Signed)
Diagnosis: Crohn's Disease  Provider:  Chilton Greathouse MD  Procedure: IV Infusion  IV Type: Peripheral, IV Location: L Forearm  Entyvio (Vedolizumab), Dose: 300 mg  Infusion Start Time: 0931 am  Infusion Stop Time: 1012 am  Post Infusion IV Care: Observation period completed and Peripheral IV Discontinued  Discharge: Condition: Good, Destination: Home . AVS Declined  Performed by:  Forrest Moron, RN

## 2023-02-25 ENCOUNTER — Encounter: Payer: Self-pay | Admitting: Internal Medicine

## 2023-02-25 ENCOUNTER — Other Ambulatory Visit: Payer: Self-pay

## 2023-02-28 ENCOUNTER — Other Ambulatory Visit: Payer: Self-pay | Admitting: Family Medicine

## 2023-02-28 DIAGNOSIS — N2889 Other specified disorders of kidney and ureter: Secondary | ICD-10-CM

## 2023-03-02 ENCOUNTER — Other Ambulatory Visit: Payer: Self-pay

## 2023-03-02 ENCOUNTER — Other Ambulatory Visit: Payer: Self-pay | Admitting: Internal Medicine

## 2023-03-02 MED ORDER — AZATHIOPRINE 50 MG PO TABS
150.0000 mg | ORAL_TABLET | Freq: Every day | ORAL | 0 refills | Status: DC
  Filled 2023-03-18: qty 270, 90d supply, fill #0

## 2023-03-03 ENCOUNTER — Ambulatory Visit: Payer: Commercial Managed Care - PPO | Attending: Internal Medicine | Admitting: Internal Medicine

## 2023-03-03 ENCOUNTER — Encounter: Payer: Self-pay | Admitting: Internal Medicine

## 2023-03-03 ENCOUNTER — Encounter: Payer: Self-pay | Admitting: *Deleted

## 2023-03-03 ENCOUNTER — Other Ambulatory Visit: Payer: Self-pay

## 2023-03-03 ENCOUNTER — Other Ambulatory Visit (HOSPITAL_COMMUNITY): Payer: Self-pay

## 2023-03-03 VITALS — BP 116/78 | HR 66 | Resp 14 | Ht 68.0 in | Wt 174.0 lb

## 2023-03-03 DIAGNOSIS — K5080 Crohn's disease of both small and large intestine without complications: Secondary | ICD-10-CM

## 2023-03-03 DIAGNOSIS — D7218 Eosinophilia in diseases classified elsewhere: Secondary | ICD-10-CM

## 2023-03-03 DIAGNOSIS — Z79899 Other long term (current) drug therapy: Secondary | ICD-10-CM | POA: Diagnosis not present

## 2023-03-03 DIAGNOSIS — M301 Polyarteritis with lung involvement [Churg-Strauss]: Secondary | ICD-10-CM | POA: Diagnosis not present

## 2023-03-03 DIAGNOSIS — D72119 Hypereosinophilic syndrome (hes), unspecified: Secondary | ICD-10-CM | POA: Diagnosis not present

## 2023-03-03 MED ORDER — PREDNISONE 10 MG PO TABS
10.0000 mg | ORAL_TABLET | Freq: Every day | ORAL | 1 refills | Status: DC
  Filled 2023-03-03: qty 90, 90d supply, fill #0
  Filled 2023-09-23: qty 90, 90d supply, fill #1

## 2023-03-03 MED ORDER — PREDNISONE 1 MG PO TABS
1.0000 mg | ORAL_TABLET | Freq: Every day | ORAL | 1 refills | Status: DC
  Filled 2023-03-03: qty 90, 90d supply, fill #0

## 2023-03-03 MED ORDER — PREDNISONE 5 MG PO TABS
5.0000 mg | ORAL_TABLET | Freq: Every day | ORAL | 1 refills | Status: DC
  Filled 2023-03-03 – 2023-05-11 (×2): qty 90, 90d supply, fill #0
  Filled 2023-08-08: qty 90, 90d supply, fill #1

## 2023-03-03 NOTE — Progress Notes (Signed)
Office Visit Note  Patient: Jack [name]             Date of Birth: [birth year]           MRN: [id number]             PCP: Eustaquio Boyden, MD Referring: Beverley Fiedler, MD Visit Date: 03/03/2023 Occupation: @GUAROCC @  Subjective:  New Patient (Initial Visit) (Patient states he needs refills of all three of his prednisones. )   Discussed the use of AI scribe software for clinical note transcription with the patient, who gave verbal consent to proceed.  History of Present Illness   Jack [name] is a 57 y.o. 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  Currently transferring care from [facility] to consolidate in [facility] health system offices.  He also has history of Crohn's disease for which he sees Dr. Rhea Belton and has been on treatment with monthly Entyvio infusion and on azathioprine 150 mg daily. He has been on prednisone for over ten years, which he has been unable to reduce due to the recurrence of respiratory symptoms. These symptoms present similarly to pneumonia, with the primary manifestation being in the lungs. The patient has been admitted to the hospital multiple times due to these symptoms, with imaging often showing nodules or inflammation in the lungs.  Lung process is thought to be eosinophilic pneumonia associated with high peripheral eosinophil counts and elevated IgE.  There has previously been concern for vasculitis but none definitively confirmed on imaging or biopsy pathology result.  The exact diagnosis remains unclear. Eosinophilic Granulomatosis with Polyangiitis (EGPA) has been suspected, but this has not been definitively confirmed.  He has been on continuous treatment with prednisone for more than a decade.  He had a few other medication changes with a trial of switching mercaptopurine to azathioprine though he is not sure how much this ever controlled  lung disease.  Chronic eosinophil mediated process dates back more than a decade workup in 2010 showing high serum IgE.at the time was more associated with pruritus rather than lung involvement.  Underwent bone marrow biopsy in 2012 indicating overproduction of eosinophils.  2014 labs with positive ANCA and rheumatoid factor.  Ongoing treatment with prednisone was continued concern about eosinophilic pneumonia versus steroid responsive Crohn's associated pneumonitis.  He has had positive ANCA serology testing in 2018 but subsequently negative follow-up tests.  He had sinonasal biopsy inconsistent with vasculitis in 2020 and procedure findings more just consistent with allergies.  IBD well controlled based on recent colonoscopy on 01/12/23. The patient is maintained on Entyvio infusions every four weeks for his Crohn's disease. He previously had a reaction to Remicade infusion. The patient has not had a flare-up of his Crohn's disease in over a year. Previously had some breakthrough disease when infusions frequency was less than monthly.  The patient also mentioned an upcoming imaging study of his kidneys due to cysts on previous imaging. However, he has not experienced any kidney-related symptoms such as stones. Recent metabolic panel and urine protein tests were normal.  He is on alendronate weekly for prevention of glucocorticoid induced osteoporosis.   Activities of Daily Living:  Patient reports morning stiffness for 0 minute.   Patient Denies nocturnal pain.  Difficulty dressing/grooming: Denies Difficulty climbing stairs: Denies Difficulty getting out of chair: Denies Difficulty using hands for taps, buttons, cutlery, and/or writing: Denies  Review of Systems  Constitutional:  Negative for fatigue.  HENT:  Negative for mouth sores and mouth dryness.   Eyes:  Negative for dryness.  Respiratory:  Negative for shortness of breath.   Cardiovascular:  Negative for chest pain and  palpitations.  Gastrointestinal:  Negative for blood in stool, constipation and diarrhea.  Endocrine: Negative for increased urination.  Genitourinary:  Negative for involuntary urination.  Musculoskeletal:  Negative for joint pain, gait problem, joint pain, joint swelling, myalgias, muscle weakness, morning stiffness, muscle tenderness and myalgias.  Skin:  Negative for color change, rash, hair loss and sensitivity to sunlight.  Allergic/Immunologic: Negative for susceptible to infections.  Neurological:  Negative for dizziness and headaches.  Hematological:  Negative for swollen glands.  Psychiatric/Behavioral:  Negative for depressed mood and sleep disturbance. The patient is not nervous/anxious.     PMFS History:  Patient Active Problem List   Diagnosis Date Noted   Benign neoplasm of cecum 01/12/2023   Benign neoplasm of descending colon 01/12/2023   Benign neoplasm of sigmoid colon 01/12/2023   Acute headache 10/27/2022   On prednisone therapy 06/09/2022   Rectal bleeding 02/12/2022   Erectile dysfunction 01/27/2022   Elevated Lp(a) 08/20/2021   S/P angioplasty with stent to RCA 08/06/21  08/07/2021   CAD (coronary artery disease) 08/06/2021   Angina pectoris without myocardial infarction (HCC) 08/06/2021   Insomnia 06/04/2020   HFrEF (heart failure with reduced ejection fraction) (HCC) 12/20/2019   Aortic dilatation (HCC) 05/29/2019   Prostate cancer (HCC) 03/02/2019   Acquired renal cyst of right kidney 08/31/2018   OSA (obstructive sleep apnea) 05/27/2018   Spinal cord tumor 05/12/2018   Leukocytosis 03/23/2018   Chronic leg pain 03/23/2018   Status post insertion of drug-eluting stent into left anterior descending (LAD) artery for coronary artery disease 03/13/2018   GIB (gastrointestinal bleeding) 03/06/2018   Post-MI pericarditis (HCC) 02/24/2018   Hyperlipidemia LDL goal <70 02/24/2018   Churg-Strauss syndrome with lung involvement (HCC) 01/08/2017   Defecation  urgency 08/04/2016   Healthcare maintenance 03/18/2016   Eosinophilic esophagitis    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 anterior STEMI 01/2018 -  proximal occlusion of LAD, treated with DES. Cath also showed 20% distal LM, 95% ostial-prox small-moderate ramus, 70-80% ostial Cx, 70% dominant ostial OM, 50-60% prox Cx, 70% RCA. EF 35% by cath with LVEDP . Med rx for residual disease. Course complicated by post MI pericarditis and LV thrombus.   Chronic systolic (congestive) heart failure (HCC)    Chronic systolic HF (heart failure) (HCC) 08/07/2021   Colitis    Colon polyp    inflammatory   Crohn disease (HCC) 1992   history uveitis, involvement of intestines and lungs   Diverticulosis    Eosinophilic granuloma (HCC)    Essential hypertension    GERD (gastroesophageal reflux disease)    GI bleeding    History of chicken pox    History of gastroesophageal reflux (GERD)    History of seizure 1995   grand mal x1, completed 6 yrs dilantin. no seizures since   Hyperlipidemia    Internal  hemorrhoids    Ischemic cardiomyopathy    LV (left ventricular) mural thrombus following MI (HCC) 01/2018   Myocardial infarction (HCC)    Osteoporosis 11/2015   DEXA T -2.9   Prostate cancer (HCC) 2021   NO TX   Schwannoma 2007   L axilla s/p surgery   Seizures (HCC)    last seizure 1995 and only x 1 seizure-    Ulnar neuropathy    h/o this from L arm schwannoma    Family History  Problem Relation Age of Onset   Heart disease Mother    CAD Mother 78       CABG   Congenital heart disease Mother     Hypertension Mother    Hyperlipidemia Mother    Heart disease Father    CAD Father 70       CABG   Hypertension Father    Hyperlipidemia Father    Crohn's disease Brother    Colon polyps Maternal Grandmother    Colon cancer Maternal Grandmother 30   Diabetes Neg Hx    Esophageal cancer Neg Hx    Rectal cancer Neg Hx    Stomach cancer Neg Hx    Past Surgical History:  Procedure Laterality Date   APPENDECTOMY  2000   BACK SURGERY  2011   lumbar   BIOPSY  01/12/2023   Procedure: BIOPSY;  Surgeon: Beverley Fiedler, MD;  Location: [facility];  Service: Gastroenterology;;   CARDIAC CATHETERIZATION     CARDIOVASCULAR STRESS TEST  01/2015   low risk study   CHOLECYSTECTOMY  2000   COLONOSCOPY  08/2014   f/u crohn's, 4 inflammatory polyps, diverticulosis, improved, rpt 2 yrs ([place])   COLONOSCOPY  11/2018   multiple polyps, 1 TA, rpt 2 yrs (Pyrtle)   COLONOSCOPY WITH PROPOFOL N/A 04/09/2018   inflammatory polyp, f/u left to primary GI - [place] Myrtie Neither, Starr Lake III, MD)   COLONOSCOPY WITH PROPOFOL N/A 01/12/2023   SSP, chronic active colitis, inflamamtory polyp, rpt 3 yrs ([place], Carie Caddy, MD)   CORONARY ANGIOGRAPHY N/A 02/21/2018   Procedure: CORONARY ANGIOGRAPHY;  Surgeon: Lyn Records, MD;  Location: [facility] IN[facility]AS[facility] CV LAB;  Service: Cardiovascular;  Laterality: N/A;   CORONARY STENT INTERVENTION N/A 08/06/2021   Procedure: CORONARY STENT INTERVENTION;  Surgeon: Lyn Records, MD;  Location: [facility];  Service: Cardiovascular;  Laterality: N/A;   CORONARY/GRAFT ACUTE MI REVASCULARIZATION N/A 02/20/2018   Procedure: Coronary/Graft Acute MI Revascularization;  Surgeon: Lyn Records, MD;  Location: [facility];  Service: Cardiovascular;  Laterality: N/A;   ESOPHAGOGASTRODUODENOSCOPY  07/2014   h/o EE resolved, focal reflux esophagitis, chronic active gastritis   ESOPHAGOGASTRODUODENOSCOPY  11/2018   gastritis, no active crohn's (Pyrtle)   extremity surgery Left     ulnar nerve repair after schwannoma removal   FLEXIBLE SIGMOIDOSCOPY N/A 04/14/2018   Procedure: FLEXIBLE SIGMOIDOSCOPY;  Surgeon: Rachael Fee, MD;  Location: [facility] ENDOSCOPY;  Service: Endoscopy;  Laterality: N/A;   HEMOSTASIS CLIP PLACEMENT  01/12/2023   Procedure: HEMOSTASIS CLIP PLACEMENT;  Surgeon: Beverley Fiedler, MD;  Location: [facility];  Service: Gastroenterology;;   ICD IMPLANT N/A 06/23/2022   Procedure: ICD IMPLANT;  Surgeon: Marinus Maw, MD;  Location: [facility] INVASIVE CV LAB;  Service: Cardiovascular;  Laterality: N/A;   INGUINAL HERNIA REPAIR Bilateral 2017   LAMINECTOMY N/A 08/09/2018   Procedure: Lumbar three Laminectomy, excision of intradural tumor;  Surgeon: Barnett Abu, MD;  Location: [facility] OR;  Service: Neurosurgery;  Laterality: N/A;   LEFT HEART CATH AND CORONARY ANGIOGRAPHY N/A 02/20/2018   Procedure: LEFT HEART CATH AND CORONARY ANGIOGRAPHY;  Surgeon: Lyn Records, MD;  Location: [facility];  Service: Cardiovascular;  Laterality: N/A;   LEFT HEART CATH AND CORONARY ANGIOGRAPHY N/A 08/06/2021   Procedure: LEFT HEART CATH AND CORONARY ANGIOGRAPHY;  Surgeon: Lyn Records, MD;  Location: [facility];  Service: Cardiovascular;  Laterality: N/A;   LUMBAR SPINE SURGERY  2011   POLYPECTOMY  04/09/2018   Procedure: POLYPECTOMY;  Surgeon: Sherrilyn Rist, MD;  Location: [facility] ENDOSCOPY;  Service: Gastroenterology;;   POLYPECTOMY  01/12/2023   Procedure: POLYPECTOMY;  Surgeon: Beverley Fiedler, MD;  Location: [facility];  Service: Gastroenterology;;   SPINAL CORD STIMULATOR IMPLANT  2012   x2 (Dr Gretta Arab)   SUBMUCOSAL INJECTION  04/09/2018   Procedure: SUBMUCOSAL INJECTION;  Surgeon: Sherrilyn Rist, MD;  Location: [facility] ENDOSCOPY;  Service: Gastroenterology;;   TONSILLECTOMY  1996   TUMOR REMOVAL Left 2007   schwannoma from L armpit   UPPER GASTROINTESTINAL ENDOSCOPY     Social History   Social History Narrative   Lives with wife, 1 dog. Grown children    Edu: college   Occ: owns mattress store   Activity: active at work, started [employer], wants to restart running   Diet: good water, fruits/vegetables daily   Immunization History  Administered Date(s) Administered   Hepb-cpg 10/19/2018, 11/23/2018   Influenza Inj Mdck Quad Pf 01/01/2017   Influenza, Seasonal, Injecte, Preservative Fre 02/11/2023   Influenza,inj,Quad PF,6+ Mos 12/22/2014, 01/04/2018, 03/02/2019, 06/04/2020, 01/27/2022   Influenza-Unspecified 11/30/2015, 01/01/2017   PFIZER(Purple Top)SARS-COV-2 Vaccination 06/21/2019, 07/19/2019, 03/22/2020   PPD Test 06/08/2012   Pneumococcal Conjugate-13 01/04/2018   Pneumococcal Polysaccharide-23 03/18/2016, 08/10/2018   Tdap 10/24/2021   Zoster Recombinant(Shingrix) 07/25/2020, 10/25/2020     Objective: Vital Signs: BP 116/78 (BP Location: Right Arm, Patient Position: Sitting, Cuff Size: Normal)   Pulse 66   Resp 14   Ht 5\' 8"  (1.727 m)   Wt 174 lb (78.9 kg)   BMI 26.46 kg/m    Physical Exam Eyes:     Conjunctiva/sclera: Conjunctivae normal.  Cardiovascular:     Rate and Rhythm: Normal rate and regular rhythm.  Pulmonary:     Effort: Pulmonary effort is normal.     Breath sounds: Normal breath sounds.  Musculoskeletal:     Right lower leg: No edema.     Left lower leg: No edema.  Lymphadenopathy:     Cervical: No cervical adenopathy.  Skin:    General: Skin is warm and dry.     Findings: No rash.  Neurological:     Mental Status: He is alert.  Psychiatric:        Mood and Affect: Mood normal.      Musculoskeletal Exam:  Shoulders full ROM no tenderness or swelling Elbows full ROM no tenderness or swelling Wrists full ROM no tenderness or swelling Fingers full ROM no tenderness or swelling Knees full ROM no tenderness or swelling Ankles full ROM no tenderness or swelling   Investigation: No additional findings.  Imaging: US Renal  Result Date: 02/26/2023 : PROCEDURE: US RENAL HISTORY: Patient is  a 57 y/o  M with f/u R upper pole renal cyst. COMPARISON: U/S renal 10/29/2018, CT abdomen/pelvis 08/30/2018. TECHNIQUE: Two-dimensional grayscale and color Doppler ultrasound of the kidneys was performed. FINDINGS: The urinary bladder demonstrates normal anechoic echogenicity without wall thickening. Color  Doppler was not utilized for evaluation of the bilateral ureteral jets. Prevoid bladder volume is 90 mL. Postvoid bladder volume is not obtained. The right kidney measures 11.1 cm. Renal cortical echotexture is increased. There is no hydronephrosis. There are no stones. There is a 1.5 cm anechoic cyst within the superior pole cortex. The left kidney measures 11.0 cm. Renal cortical echotexture is increased. There is no hydronephrosis. There are no stones. There is a 1.6 cm hypoechoic, avascular lesion visualized anterior mid cortex. IMPRESSION: 1. Increased echogenicity of the bilateral renal cortices with a right-sided simple cyst, suggestive of medical renal disease. 2. Left anterior mid renal cortex 1.6 cm hypoechoic, avascular lesion. This does not appear entirely cystic on today's exam. Further evaluation with multiphase CT of the abdomen is recommended Thank you for allowing [place] to assist in the care of this patient. Electronically Signed   By: Lestine Box M.D.   On: 02/26/2023 14:23    Recent Labs: Lab Results  Component Value Date   WBC 7.4 02/04/2023   HGB 13.8 02/04/2023   PLT 286.0 02/04/2023   NA 140 02/04/2023   K 4.3 02/04/2023   CL 104 02/04/2023   CO2 31 02/04/2023   GLUCOSE 78 02/04/2023   BUN 25 (H) 02/04/2023   CREATININE 1.17 02/04/2023   BILITOT 0.8 02/04/2023   ALKPHOS 44 02/04/2023   AST 23 02/04/2023   ALT 39 02/04/2023   PROT 6.4 02/04/2023   ALBUMIN 3.8 02/04/2023   CALCIUM 8.8 02/04/2023   GFRAA 88 01/27/2020    Speciality Comments: No specialty comments available.  Procedures:  No procedures performed Allergies: Cymbalta [duloxetine hcl], Infliximab, and  Keflex [cephalexin]   Assessment / Plan:     Visit Diagnoses: Hypereosinophilic syndrome, unspecified type  Churg-Strauss syndrome with lung involvement (HCC) - Plan: ANCA Screen Reflex Titer([facility]), C-reactive protein, IgG, IgA, IgM, predniSONE (DELTASONE) 1 MG tablet, predniSONE (DELTASONE) 10 MG tablet, predniSONE (DELTASONE) 5 MG tablet  Chronic steroid responsive lung inflammation concern for eosinophilic pneumonia versus EGPA versus extraintestinal Crohn's inflammation with very high serum IgE and eosinophils with any prednisone reduction.  Currently tolerate 16 mg daily but within days or we can start to feel symptoms if he goes down to less than 15 mg daily.  Discussed difficulty in getting a completely definitive diagnosis since he already had nonspecific biopsy tests and imaging for years.  It looks like trial of Mepolizumab was recommended by [facility] pulmonology I am not sure whether he actually really got on this treatment or not.  Will check ANCA serology, CRP, quantitative immunoglobulins for inflammatory disease activity assessment today.  New prednisone prescription refill sent in today for 16 mg daily.  Crohn's disease of both small and large intestine without complication (HCC)  In remission by last colonoscopy.  On Entyvio infusions monthly.  Is on azathioprine 150 mg daily.  Not sure how much this DMARD is doing for controlling inflammation but would agree with continuing IMT alongside nfusions especially considering previous development of infliximab reaction.  High risk medication use  Recent CBC and CMP reviewed drawn with PCP office last month these look fine.  Not suffering any serious or recurrent infections.  Discussed risk of long-term steroid use including osteoporosis for which she is on alendronate weekly for prevention of GI OP.   Orders: Orders Placed This Encounter  Procedures   ANCA Screen Reflex Titer(QUEST)   C-reactive protein   IgG, IgA, IgM   Meds ordered  this encounter  Medications  predniSONE (DELTASONE) 1 MG tablet    Sig: Take 1 tablet (1 mg total) by mouth daily.    Dispense:  90 tablet    Refill:  1    In combination with 5 mg and 10 mg tablets for total daily dose 16mg    predniSONE (DELTASONE) 10 MG tablet    Sig: Take 1 tablet (10 mg total) by mouth daily with breakfast.    Dispense:  90 tablet    Refill:  1    In combination with 5 mg and 1 mg tablets for total daily dose 16mg    predniSONE (DELTASONE) 5 MG tablet    Sig: Take 1 tablet (5 mg total) by mouth daily.    Dispense:  90 tablet    Refill:  1    In combination with 1 mg and 10 mg tablets for total daily dose 16mg      Follow-Up Instructions: Return in about 3 months (around 06/01/2023) for New pt ?EGPA/Crohn's on GC/Vedo f/u 3mos.   Fuller Plan, MD  Note - This record has been created using [employer].  Chart creation errors have been sought, but may not always  have been located. Such creation errors do not reflect on  the standard of medical care.

## 2023-03-04 ENCOUNTER — Other Ambulatory Visit (HOSPITAL_COMMUNITY): Payer: Self-pay

## 2023-03-07 LAB — ANCA SCREEN W REFLEX TITER: ANCA SCREEN: NEGATIVE

## 2023-03-07 LAB — IGG, IGA, IGM
IgG (Immunoglobin G), Serum: 947 mg/dL (ref 600–1640)
IgM, Serum: 85 mg/dL (ref 50–300)
Immunoglobulin A: 131 mg/dL (ref 47–310)

## 2023-03-07 LAB — C-REACTIVE PROTEIN: CRP: 4.7 mg/L (ref <8.0)

## 2023-03-09 ENCOUNTER — Telehealth: Payer: Self-pay | Admitting: Family Medicine

## 2023-03-09 NOTE — Telephone Encounter (Signed)
Pansy at [facility] stated that the  CT order needs to be changed to with and without contrast  Any questions, please call [phone] option 1, then  option 4

## 2023-03-10 DIAGNOSIS — N2889 Other specified disorders of kidney and ureter: Secondary | ICD-10-CM | POA: Insufficient documentation

## 2023-03-10 NOTE — Progress Notes (Signed)
Changed order to CT with and without contrast per radiology recommendation.

## 2023-03-10 NOTE — Telephone Encounter (Signed)
Order changed. I cannot cancel initial CT as it's already scheduled.

## 2023-03-10 NOTE — Telephone Encounter (Signed)
Spoke with Pansy relaying Dr Timoteo Expose message. She expresses her thanks and will c/x previous order.

## 2023-03-10 NOTE — Addendum Note (Signed)
Addended by: Eustaquio Boyden on: 03/10/2023 07:26 AM   Modules accepted: Orders

## 2023-03-13 ENCOUNTER — Other Ambulatory Visit (HOSPITAL_COMMUNITY): Payer: Self-pay

## 2023-03-15 ENCOUNTER — Ambulatory Visit: Payer: Commercial Managed Care - PPO

## 2023-03-18 ENCOUNTER — Other Ambulatory Visit: Payer: Self-pay | Admitting: Internal Medicine

## 2023-03-18 ENCOUNTER — Other Ambulatory Visit: Payer: Self-pay

## 2023-03-18 ENCOUNTER — Other Ambulatory Visit (HOSPITAL_COMMUNITY): Payer: Self-pay

## 2023-03-18 MED ORDER — AZATHIOPRINE 50 MG PO TABS
150.0000 mg | ORAL_TABLET | Freq: Every day | ORAL | 1 refills | Status: DC
  Filled 2023-03-19: qty 270, 90d supply, fill #0
  Filled 2023-05-16: qty 27, 9d supply, fill #1
  Filled 2023-05-16 – 2023-06-22 (×3): qty 270, 90d supply, fill #1

## 2023-03-19 ENCOUNTER — Other Ambulatory Visit (HOSPITAL_BASED_OUTPATIENT_CLINIC_OR_DEPARTMENT_OTHER): Payer: Self-pay

## 2023-03-19 ENCOUNTER — Other Ambulatory Visit (HOSPITAL_COMMUNITY): Payer: Self-pay

## 2023-03-20 ENCOUNTER — Ambulatory Visit
Admission: RE | Admit: 2023-03-20 | Discharge: 2023-03-20 | Disposition: A | Discharge status: Home / Self Care | Payer: Commercial Managed Care - PPO | Source: Ambulatory Visit | Attending: Family Medicine | Admitting: Family Medicine

## 2023-03-20 ENCOUNTER — Other Ambulatory Visit: Payer: Commercial Managed Care - PPO

## 2023-03-20 DIAGNOSIS — I2510 Atherosclerotic heart disease of native coronary artery without angina pectoris: Secondary | ICD-10-CM | POA: Diagnosis not present

## 2023-03-20 DIAGNOSIS — N2889 Other specified disorders of kidney and ureter: Secondary | ICD-10-CM

## 2023-03-20 DIAGNOSIS — N281 Cyst of kidney, acquired: Secondary | ICD-10-CM | POA: Diagnosis not present

## 2023-03-20 MED ORDER — IOPAMIDOL (ISOVUE-300) INJECTION 61%
500.0000 mL | Freq: Once | INTRAVENOUS | Status: AC | PRN
  Administered 2023-03-20: 100 mL via INTRAVENOUS

## 2023-03-23 ENCOUNTER — Ambulatory Visit (INDEPENDENT_AMBULATORY_CARE_PROVIDER_SITE_OTHER): Payer: Commercial Managed Care - PPO

## 2023-03-23 ENCOUNTER — Ambulatory Visit: Payer: Commercial Managed Care - PPO

## 2023-03-23 VITALS — BP 106/68 | HR 65 | Temp 97.5°F | Resp 18 | Ht 67.0 in | Wt 170.4 lb

## 2023-03-23 DIAGNOSIS — I5022 Chronic systolic (congestive) heart failure: Secondary | ICD-10-CM | POA: Diagnosis not present

## 2023-03-23 DIAGNOSIS — K5080 Crohn's disease of both small and large intestine without complications: Secondary | ICD-10-CM

## 2023-03-23 LAB — CUP PACEART REMOTE DEVICE CHECK
Battery Remaining Longevity: 162 mo
Battery Voltage: 3.03 V
Brady Statistic RV Percent Paced: 0.01 %
Date Time Interrogation Session: 20241222194435
HighPow Impedance: 63 Ohm
Implantable Lead Connection Status: 753985
Implantable Lead Implant Date: 20240325
Implantable Lead Location: 753860
Implantable Lead Model: 6935
Implantable Pulse Generator Implant Date: 20240325
Lead Channel Impedance Value: 475 Ohm
Lead Channel Impedance Value: 608 Ohm
Lead Channel Pacing Threshold Amplitude: 0.5 V
Lead Channel Pacing Threshold Pulse Width: 0.4 ms
Lead Channel Sensing Intrinsic Amplitude: 20.4 mV
Lead Channel Setting Pacing Amplitude: 2 V
Lead Channel Setting Pacing Pulse Width: 0.4 ms
Lead Channel Setting Sensing Sensitivity: 0.3 mV
Zone Setting Status: 755011

## 2023-03-23 MED ORDER — SODIUM CHLORIDE 0.9 % IV SOLN
300.0000 mg | Freq: Once | INTRAVENOUS | Status: AC
  Administered 2023-03-23: 300 mg via INTRAVENOUS
  Filled 2023-03-23: qty 5

## 2023-03-23 NOTE — Progress Notes (Signed)
Diagnosis: Crohn's Disease  Provider:  Chilton Greathouse MD  Procedure: IV Infusion  IV Type: Peripheral, IV Location: L Antecubital  Entyvio (Vedolizumab), Dose: 300 mg  Infusion Start Time: 0926  Infusion Stop Time: 0958  Post Infusion IV Care: Peripheral IV Discontinued  Discharge: Condition: Good, Destination: Home . AVS Declined  Performed by:  Garnette Czech, RN

## 2023-03-27 ENCOUNTER — Other Ambulatory Visit (HOSPITAL_COMMUNITY): Payer: Self-pay

## 2023-03-27 ENCOUNTER — Other Ambulatory Visit: Payer: Self-pay

## 2023-03-27 ENCOUNTER — Encounter: Payer: Self-pay | Admitting: Internal Medicine

## 2023-04-02 ENCOUNTER — Other Ambulatory Visit (HOSPITAL_COMMUNITY): Payer: Self-pay

## 2023-04-14 ENCOUNTER — Other Ambulatory Visit (HOSPITAL_COMMUNITY): Payer: Self-pay

## 2023-04-20 ENCOUNTER — Ambulatory Visit: Payer: Commercial Managed Care - PPO

## 2023-04-20 VITALS — BP 100/62 | HR 64 | Temp 97.8°F | Resp 18 | Ht 67.0 in | Wt 173.2 lb

## 2023-04-20 DIAGNOSIS — K5080 Crohn's disease of both small and large intestine without complications: Secondary | ICD-10-CM | POA: Diagnosis not present

## 2023-04-20 MED ORDER — SODIUM CHLORIDE 0.9 % IV SOLN
300.0000 mg | Freq: Once | INTRAVENOUS | Status: AC
  Administered 2023-04-20: 300 mg via INTRAVENOUS
  Filled 2023-04-20: qty 5

## 2023-04-20 NOTE — Progress Notes (Signed)
Diagnosis: Crohn's Disease  Provider:  Chilton Greathouse MD  Procedure: IV Infusion  IV Type: Peripheral, IV Location: L Forearm  Entyvio (Vedolizumab), Dose: 300 mg  Infusion Start Time: 0911  Infusion Stop Time: 0944  Post Infusion IV Care: Peripheral IV Discontinued  Discharge: Condition: Good, Destination: Home . AVS Declined  Performed by:  Loney Hering, LPN

## 2023-04-27 ENCOUNTER — Encounter: Payer: Self-pay | Admitting: Internal Medicine

## 2023-04-27 ENCOUNTER — Other Ambulatory Visit (HOSPITAL_COMMUNITY): Payer: Self-pay

## 2023-04-30 NOTE — Progress Notes (Signed)
Remote ICD transmission.

## 2023-05-04 ENCOUNTER — Other Ambulatory Visit: Payer: Self-pay

## 2023-05-04 ENCOUNTER — Other Ambulatory Visit (HOSPITAL_COMMUNITY): Payer: Self-pay

## 2023-05-06 ENCOUNTER — Other Ambulatory Visit: Payer: Self-pay | Admitting: Interventional Cardiology

## 2023-05-06 ENCOUNTER — Other Ambulatory Visit (HOSPITAL_COMMUNITY): Payer: Self-pay

## 2023-05-06 MED ORDER — CLOPIDOGREL BISULFATE 75 MG PO TABS
75.0000 mg | ORAL_TABLET | Freq: Every day | ORAL | 0 refills | Status: DC
  Filled 2023-05-06: qty 90, 90d supply, fill #0

## 2023-05-08 ENCOUNTER — Other Ambulatory Visit (HOSPITAL_COMMUNITY): Payer: Self-pay

## 2023-05-08 ENCOUNTER — Telehealth: Payer: Self-pay

## 2023-05-08 NOTE — Telephone Encounter (Signed)
 Pharmacy Patient Advocate Encounter   Received notification from CoverMyMeds that prior authorization for VASCEPA  is required/requested.   Insurance verification completed.   The patient is insured through [facility] .   Per test claim: The current 30 day co-pay is, $5.  No PA needed at this time. This test claim was processed through [facility]- copay amounts may vary at other pharmacies due to pharmacy/plan contracts, or as the patient moves through the different stages of their insurance plan.

## 2023-05-09 ENCOUNTER — Other Ambulatory Visit: Payer: Self-pay

## 2023-05-09 ENCOUNTER — Other Ambulatory Visit: Payer: Self-pay | Admitting: Interventional Cardiology

## 2023-05-10 ENCOUNTER — Other Ambulatory Visit: Payer: Self-pay

## 2023-05-11 ENCOUNTER — Other Ambulatory Visit: Payer: Self-pay | Admitting: Family Medicine

## 2023-05-11 ENCOUNTER — Other Ambulatory Visit (HOSPITAL_COMMUNITY): Payer: Self-pay

## 2023-05-11 ENCOUNTER — Other Ambulatory Visit (HOSPITAL_BASED_OUTPATIENT_CLINIC_OR_DEPARTMENT_OTHER): Payer: Self-pay

## 2023-05-11 ENCOUNTER — Other Ambulatory Visit: Payer: Self-pay

## 2023-05-11 DIAGNOSIS — K200 Eosinophilic esophagitis: Secondary | ICD-10-CM

## 2023-05-11 MED ORDER — PANTOPRAZOLE SODIUM 40 MG PO TBEC
40.0000 mg | DELAYED_RELEASE_TABLET | Freq: Every day | ORAL | 2 refills | Status: DC
  Filled 2023-05-11: qty 90, 90d supply, fill #0
  Filled 2023-08-08: qty 90, 90d supply, fill #1
  Filled 2023-11-03: qty 90, 90d supply, fill #2

## 2023-05-11 MED ORDER — DAPAGLIFLOZIN PROPANEDIOL 10 MG PO TABS
10.0000 mg | ORAL_TABLET | Freq: Every day | ORAL | 0 refills | Status: DC
  Filled 2023-05-11: qty 90, 90d supply, fill #0

## 2023-05-11 MED FILL — Carvedilol Tab 3.125 MG: ORAL | 90 days supply | Qty: 180 | Fill #2 | Status: AC

## 2023-05-12 ENCOUNTER — Telehealth: Payer: Self-pay | Admitting: *Deleted

## 2023-05-12 MED ORDER — ALENDRONATE SODIUM 70 MG PO TABS: 70.0000 mg | ORAL_TABLET | ORAL | 0 refills | Status: DC

## 2023-05-12 NOTE — Telephone Encounter (Signed)
Attempted to contact the patient and left message to advise patient prescription has been sent to the pharmacy for him.

## 2023-05-12 NOTE — Telephone Encounter (Signed)
He is supposed to be on this, was previously being prescribed by Dr. Kathi Ludwig. For prevention of glucocorticoid induced osteoporosis on long term prednisone for his eosinophilic pneumonia.

## 2023-05-12 NOTE — Telephone Encounter (Signed)
Patient contacted the office and states he is a new patient. Patient states he is on Fosamax and is out of refills. Patient is requesting a refill to be sent to [facility] in [place] on [address]. It is not mentioned in the recent office note. Please advise.

## 2023-05-12 NOTE — Addendum Note (Signed)
Addended by: Fuller Plan on: 05/12/2023 03:41 PM   Modules accepted: Orders

## 2023-05-16 ENCOUNTER — Other Ambulatory Visit (HOSPITAL_COMMUNITY): Payer: Self-pay

## 2023-05-16 ENCOUNTER — Encounter: Payer: Self-pay | Admitting: Internal Medicine

## 2023-05-18 ENCOUNTER — Ambulatory Visit: Payer: Commercial Managed Care - PPO

## 2023-05-18 ENCOUNTER — Other Ambulatory Visit (HOSPITAL_COMMUNITY): Payer: Self-pay

## 2023-05-18 VITALS — BP 120/73 | HR 67 | Temp 97.8°F | Resp 18 | Ht 67.0 in | Wt 171.0 lb

## 2023-05-18 DIAGNOSIS — K5080 Crohn's disease of both small and large intestine without complications: Secondary | ICD-10-CM

## 2023-05-18 MED ORDER — VEDOLIZUMAB 300 MG IV SOLR
300.0000 mg | Freq: Once | INTRAVENOUS | Status: AC
  Administered 2023-05-18: 300 mg via INTRAVENOUS
  Filled 2023-05-18: qty 5

## 2023-05-18 NOTE — Progress Notes (Signed)
Diagnosis: Crohn's Disease  Provider:  Chilton Greathouse MD  Procedure: IV Infusion  IV Type: Peripheral, IV Location: L Forearm  Entyvio (Vedolizumab), Dose: 300 mg  Infusion Start Time: 0924  Infusion Stop Time: 1001  Post Infusion IV Care: Peripheral IV Discontinued  Discharge: Condition: Good, Destination: Home . AVS Declined  Performed by:  Loney Hering, LPN

## 2023-05-19 ENCOUNTER — Other Ambulatory Visit (HOSPITAL_COMMUNITY): Payer: Self-pay

## 2023-05-19 ENCOUNTER — Other Ambulatory Visit: Payer: Self-pay

## 2023-05-20 ENCOUNTER — Other Ambulatory Visit (HOSPITAL_COMMUNITY): Payer: Self-pay

## 2023-05-28 ENCOUNTER — Encounter: Payer: Self-pay | Admitting: Internal Medicine

## 2023-05-28 ENCOUNTER — Other Ambulatory Visit: Payer: Self-pay

## 2023-05-31 NOTE — Progress Notes (Unsigned)
 Office Visit Note  Patient: Jack [name]             Date of Birth: April 21, 1965           MRN: [id number]             PCP: Eustaquio Boyden, MD Referring: Eustaquio Boyden, MD Visit Date: 06/01/2023   Subjective:  No chief complaint on file.   History of Present Illness: Jack [name] is a 58 y.o. male here for follow up ***   Previous HPI 03/03/23 Jack [name] is a 58 y.o. 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  Currently transferring care from [facility] to consolidate in [facility] health system offices.  He also has history of Crohn's disease for which he sees Dr. Rhea Belton and has been on treatment with monthly Entyvio infusion and on azathioprine 150 mg daily. He has been on prednisone for over ten years, which he has been unable to reduce due to the recurrence of respiratory symptoms. These symptoms present similarly to pneumonia, with the primary manifestation being in the lungs. The patient has been admitted to the hospital multiple times due to these symptoms, with imaging often showing nodules or inflammation in the lungs.   Lung process is thought to be eosinophilic pneumonia associated with high peripheral eosinophil counts and elevated IgE.  There has previously been concern for vasculitis but none definitively confirmed on imaging or biopsy pathology result.  The exact diagnosis remains unclear. Eosinophilic Granulomatosis with Polyangiitis (EGPA) has been suspected, but this has not been definitively confirmed.  He has been on continuous treatment with prednisone for more than a decade.  He had a few other medication changes with a trial of switching mercaptopurine to azathioprine though he is not sure how much this ever controlled lung disease.   Chronic eosinophil mediated process dates back more than a decade workup in 2010 showing high serum IgE.at the  time was more associated with pruritus rather than lung involvement.  Underwent bone marrow biopsy in 2012 indicating overproduction of eosinophils.  2014 labs with positive ANCA and rheumatoid factor.  Ongoing treatment with prednisone was continued concern about eosinophilic pneumonia versus steroid responsive Crohn's associated pneumonitis.  He has had positive ANCA serology testing in 2018 but subsequently negative follow-up tests.  He had sinonasal biopsy inconsistent with vasculitis in 2020 and procedure findings more just consistent with allergies.   IBD well controlled based on recent colonoscopy on 01/12/23. The patient is maintained on Entyvio infusions every four weeks for his Crohn's disease. He previously had a reaction to Remicade infusion. The patient has not had a flare-up of his Crohn's disease in over a year. Previously had some breakthrough disease when infusions frequency was less than monthly.   The patient also mentioned an upcoming imaging study of his kidneys due to cysts on previous imaging. However, he has not experienced any kidney-related symptoms such as stones. Recent metabolic panel and urine protein tests were normal.   He is on alendronate weekly for prevention of glucocorticoid induced osteoporosis.   No Rheumatology ROS completed.   PMFS History:  Patient Active Problem List   Diagnosis Date Noted   Benign neoplasm of cecum 01/12/2023   Benign neoplasm of descending colon 01/12/2023   Benign neoplasm of sigmoid colon 01/12/2023   Acute headache 10/27/2022   On prednisone therapy 06/09/2022   Rectal bleeding 02/12/2022  Erectile dysfunction 01/27/2022   Elevated Lp(a) 08/20/2021   S/P angioplasty with stent to RCA 08/06/21  08/07/2021   CAD (coronary artery disease) 08/06/2021   Angina pectoris without myocardial infarction (HCC) 08/06/2021   Insomnia 06/04/2020   HFrEF (heart failure with reduced ejection fraction) (HCC) 12/20/2019   Aortic dilatation  (HCC) 05/29/2019   Prostate cancer (HCC) 03/02/2019   Acquired renal cyst of right kidney 08/31/2018   OSA (obstructive sleep apnea) 05/27/2018   Spinal cord tumor 05/12/2018   Leukocytosis 03/23/2018   Chronic leg pain 03/23/2018   Status post insertion of drug-eluting stent into left anterior descending (LAD) artery for coronary artery disease 03/13/2018   GIB (gastrointestinal bleeding) 03/06/2018   Post-MI pericarditis (HCC) 02/24/2018   Hyperlipidemia LDL goal <70 02/24/2018   Churg-Strauss syndrome with lung involvement (HCC) 01/08/2017   Defecation urgency 08/04/2016   Healthcare maintenance 03/18/2016   Eosinophilic esophagitis    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 anterior STEMI 01/2018 -  proximal occlusion of LAD, treated with DES. Cath also showed 20% distal LM, 95% ostial-prox small-moderate ramus, 70-80% ostial Cx, 70% dominant ostial OM, 50-60% prox Cx, 70% RCA. EF 35% by cath with LVEDP . Med rx for residual disease. Course complicated by post MI pericarditis and LV thrombus.   Chronic systolic (congestive) heart failure (HCC)    Chronic systolic HF (heart failure) (HCC) 08/07/2021   Colitis    Colon polyp    inflammatory   Crohn disease (HCC) 1992   history uveitis, involvement of intestines and lungs   Diverticulosis    Eosinophilic granuloma (HCC)    Essential hypertension    GERD (gastroesophageal reflux disease)    GI bleeding    History of chicken pox    History of gastroesophageal reflux (GERD)    History of seizure 1995   grand mal x1, completed 6 yrs dilantin. no  seizures since   Hyperlipidemia    Internal hemorrhoids    Ischemic cardiomyopathy    LV (left ventricular) mural thrombus following MI (HCC) 01/2018   Myocardial infarction (HCC)    Osteoporosis 11/2015   DEXA T -2.9   Prostate cancer (HCC) 2021   NO TX   Schwannoma 2007   L axilla s/p surgery   Seizures (HCC)    last seizure 1995 and only x 1 seizure-    Ulnar neuropathy    h/o this from L arm schwannoma    Family History  Problem Relation Age of Onset   Heart disease Mother    CAD Mother 32       CABG   Congenital heart disease Mother    Hypertension Mother    Hyperlipidemia Mother    Heart disease Father    CAD Father 30       CABG   Hypertension Father    Hyperlipidemia Father    Crohn's disease Brother    Colon polyps Maternal Grandmother    Colon cancer Maternal Grandmother 66   Diabetes Neg Hx    Esophageal cancer Neg Hx    Rectal cancer Neg  Hx    Stomach cancer Neg Hx    Past Surgical History:  Procedure Laterality Date   APPENDECTOMY  2000   BACK SURGERY  2011   lumbar   BIOPSY  01/12/2023   Procedure: BIOPSY;  Surgeon: Beverley Fiedler, MD;  Location: [facility] ENDOSCOPY;  Service: Gastroenterology;;   CARDIAC CATHETERIZATION     CARDIOVASCULAR STRESS TEST  01/2015   low risk study   CHOLECYSTECTOMY  2000   COLONOSCOPY  08/2014   f/u crohn's, 4 inflammatory polyps, diverticulosis, improved, rpt 2 yrs (Barish)   COLONOSCOPY  11/2018   multiple polyps, 1 TA, rpt 2 yrs (Pyrtle)   COLONOSCOPY WITH PROPOFOL N/A 04/09/2018   inflammatory polyp, f/u left to primary GI - Pyrtle Myrtie Neither, Starr Lake III, MD)   COLONOSCOPY WITH PROPOFOL N/A 01/12/2023   SSP, chronic active colitis, inflamamtory polyp, rpt 3 yrs (Pyrtle, Carie Caddy, MD)   CORONARY ANGIOGRAPHY N/A 02/21/2018   Procedure: CORONARY ANGIOGRAPHY;  Surgeon: Lyn Records, MD;  Location: [facility] INVASIVE CV LAB;  Service: Cardiovascular;  Laterality: N/A;   CORONARY STENT INTERVENTION N/A 08/06/2021   Procedure: CORONARY  STENT INTERVENTION;  Surgeon: Lyn Records, MD;  Location: [facility] IN[facility] CV LAB;  Service: Cardiovascular;  Laterality: N/A;   CORONARY/GRAFT ACUTE MI REVASCULARIZATION N/A 02/20/2018   Procedure: Coronary/Graft Acute MI Revascularization;  Surgeon: Lyn Records, MD;  Location: [facility];  Service: Cardiovascular;  Laterality: N/A;   ESOPHAGOGASTRODUODENOSCOPY  07/2014   h/o EE resolved, focal reflux esophagitis, chronic active gastritis   ESOPHAGOGASTRODUODENOSCOPY  11/2018   gastritis, no active crohn's (Pyrtle)   extremity surgery Left    ulnar nerve repair after schwannoma removal   FLEXIBLE SIGMOIDOSCOPY N/A 04/14/2018   Procedure: FLEXIBLE SIGMOIDOSCOPY;  Surgeon: Rachael Fee, MD;  Location: [facility] ENDOSCOPY;  Service: Endoscopy;  Laterality: N/A;   HEMOSTASIS CLIP PLACEMENT  01/12/2023   Procedure: HEMOSTASIS CLIP PLACEMENT;  Surgeon: Beverley Fiedler, MD;  Location: [facility];  Service: Gastroenterology;;   ICD IMPLANT N/A 06/23/2022   Procedure: ICD IMPLANT;  Surgeon: Marinus Maw, MD;  Location: [facility] INVASIVE CV LAB;  Service: Cardiovascular;  Laterality: N/A;   INGUINAL HERNIA REPAIR Bilateral 2017   LAMINECTOMY N/A 08/09/2018   Procedure: Lumbar three Laminectomy, excision of intradural tumor;  Surgeon: Barnett Abu, MD;  Location: [facility] OR;  Service: Neurosurgery;  Laterality: N/A;   LEFT HEART CATH AND CORONARY ANGIOGRAPHY N/A 02/20/2018   Procedure: LEFT HEART CATH AND CORONARY ANGIOGRAPHY;  Surgeon: Lyn Records, MD;  Location: [facility] CV [facility];  Service: Cardiovascular;  Laterality: N/A;   LEFT HEART CATH AND CORONARY ANGIOGRAPHY N/A 08/06/2021   Procedure: LEFT HEART CATH AND CORONARY ANGIOGRAPHY;  Surgeon: Lyn Records, MD;  Location: [facility] INV[facility] CV LAB;  Service: Cardiovascular;  Laterality: N/A;   LUMBAR SPINE SURGERY  2011   POLYPECTOMY  04/09/2018   Procedure: POLYPECTOMY;  Surgeon: Sherrilyn Rist, MD;  Location: [facility] ENDOSCOPY;  Service:  Gastroenterology;;   POLYPECTOMY  01/12/2023   Procedure: POLYPECTOMY;  Surgeon: Beverley Fiedler, MD;  Location: [facility] ENDOSCOPY;  Service: Gastroenterology;;   SPINAL CORD STIMULATOR IMPLANT  2012   x2 (Dr Gretta Arab)   SUBMUCOSAL INJECTION  04/09/2018   Procedure: SUBMUCOSAL INJECTION;  Surgeon: Sherrilyn Rist, MD;  Location: [facility] ENDOSCOPY;  Service: Gastroenterology;;   TONSILLECTOMY  1996   TUMOR REMOVAL Left 2007   schwannoma from L armpit   UPPER GASTROINTESTINAL ENDOSCOPY  Social History   Social History Narrative   Lives with wife, 1 dog. Grown children   Edu: college   Occ: owns mattress store   Activity: active at work, started [employer], wants to restart running   Diet: good water, fruits/vegetables daily   Immunization History  Administered Date(s) Administered   Hepb-cpg 10/19/2018, 11/23/2018   Influenza Inj Mdck Quad Pf 01/01/2017   Influenza, Seasonal, Injecte, Preservative Fre 02/11/2023   Influenza,inj,Quad PF,6+ Mos 12/22/2014, 01/04/2018, 03/02/2019, 06/04/2020, 01/27/2022   Influenza-Unspecified 11/30/2015, 01/01/2017   PFIZER(Purple Top)SARS-COV-2 Vaccination 06/21/2019, 07/19/2019, 03/22/2020   PPD Test 06/08/2012   Pneumococcal Conjugate-13 01/04/2018   Pneumococcal Polysaccharide-23 03/18/2016, 08/10/2018   Tdap 10/24/2021   Zoster Recombinant(Shingrix) 07/25/2020, 10/25/2020     Objective: Vital Signs: There were no vitals taken for this visit.   Physical Exam   Musculoskeletal Exam: ***  CDAI Exam: CDAI Score: -- Patient Global: --; Provider Global: -- Swollen: --; Tender: -- Joint Exam 06/01/2023   No joint exam has been documented for this visit   There is currently no information documented on the homunculus. Go to the Rheumatology activity and complete the homunculus joint exam.  Investigation: No additional findings.  Imaging: No results found.  Recent Labs: Lab Results  Component Value Date   WBC 7.4 02/04/2023   HGB 13.8  02/04/2023   PLT 286.0 02/04/2023   NA 140 02/04/2023   K 4.3 02/04/2023   CL 104 02/04/2023   CO2 31 02/04/2023   GLUCOSE 78 02/04/2023   BUN 25 (H) 02/04/2023   CREATININE 1.17 02/04/2023   BILITOT 0.8 02/04/2023   ALKPHOS 44 02/04/2023   AST 23 02/04/2023   ALT 39 02/04/2023   PROT 6.4 02/04/2023   ALBUMIN 3.8 02/04/2023   CALCIUM 8.8 02/04/2023   GFRAA 88 01/27/2020    Speciality Comments: No specialty comments available.  Procedures:  No procedures performed Allergies: Cymbalta [duloxetine hcl], Infliximab, and Keflex [cephalexin]   Assessment / Plan:     Visit Diagnoses: No diagnosis found.  ***  Orders: No orders of the defined types were placed in this encounter.  No orders of the defined types were placed in this encounter.    Follow-Up Instructions: No follow-ups on file.   Fuller Plan, MD  Note - This record has been created using [employer].  Chart creation errors have been sought, but may not always  have been located. Such creation errors do not reflect on  the standard of medical care.

## 2023-06-01 ENCOUNTER — Encounter: Payer: Self-pay | Admitting: Internal Medicine

## 2023-06-01 ENCOUNTER — Ambulatory Visit: Payer: Commercial Managed Care - PPO | Attending: Internal Medicine | Admitting: Internal Medicine

## 2023-06-01 VITALS — BP 99/63 | HR 71 | Resp 14 | Ht 67.0 in | Wt 171.0 lb

## 2023-06-01 DIAGNOSIS — Z79899 Other long term (current) drug therapy: Secondary | ICD-10-CM | POA: Diagnosis not present

## 2023-06-01 DIAGNOSIS — K5080 Crohn's disease of both small and large intestine without complications: Secondary | ICD-10-CM | POA: Diagnosis not present

## 2023-06-01 DIAGNOSIS — M301 Polyarteritis with lung involvement [Churg-Strauss]: Secondary | ICD-10-CM

## 2023-06-01 DIAGNOSIS — Z7952 Long term (current) use of systemic steroids: Secondary | ICD-10-CM

## 2023-06-01 DIAGNOSIS — D7218 Eosinophilia in diseases classified elsewhere: Secondary | ICD-10-CM | POA: Diagnosis not present

## 2023-06-02 ENCOUNTER — Encounter: Payer: Self-pay | Admitting: Internal Medicine

## 2023-06-03 LAB — CBC WITH DIFFERENTIAL/PLATELET
Absolute Lymphocytes: 894 {cells}/uL (ref 850–3900)
Absolute Monocytes: 1041 {cells}/uL — ABNORMAL HIGH (ref 200–950)
Basophils Absolute: 86 {cells}/uL (ref 0–200)
Basophils Relative: 1 %
Eosinophils Absolute: 232 {cells}/uL (ref 15–500)
Eosinophils Relative: 2.7 %
HCT: 43.4 % (ref 38.5–50.0)
Hemoglobin: 14.1 g/dL (ref 13.2–17.1)
MCH: 30.4 pg (ref 27.0–33.0)
MCHC: 32.5 g/dL (ref 32.0–36.0)
MCV: 93.5 fL (ref 80.0–100.0)
MPV: 10.2 fL (ref 7.5–12.5)
Monocytes Relative: 12.1 %
Neutro Abs: 6347 {cells}/uL (ref 1500–7800)
Neutrophils Relative %: 73.8 %
Platelets: 261 10*3/uL (ref 140–400)
RBC: 4.64 10*6/uL (ref 4.20–5.80)
RDW: 13.7 % (ref 11.0–15.0)
Total Lymphocyte: 10.4 %
WBC: 8.6 10*3/uL (ref 3.8–10.8)

## 2023-06-03 LAB — COMPLETE METABOLIC PANEL WITH GFR
AG Ratio: 1.7 (calc) (ref 1.0–2.5)
ALT: 28 U/L (ref 9–46)
AST: 23 U/L (ref 10–35)
Albumin: 3.9 g/dL (ref 3.6–5.1)
Alkaline phosphatase (APISO): 41 U/L (ref 35–144)
BUN: 25 mg/dL (ref 7–25)
CO2: 28 mmol/L (ref 20–32)
Calcium: 8.6 mg/dL (ref 8.6–10.3)
Chloride: 106 mmol/L (ref 98–110)
Creat: 1 mg/dL (ref 0.70–1.30)
Globulin: 2.3 g/dL (ref 1.9–3.7)
Glucose, Bld: 99 mg/dL (ref 65–99)
Potassium: 4.1 mmol/L (ref 3.5–5.3)
Sodium: 142 mmol/L (ref 135–146)
Total Bilirubin: 0.9 mg/dL (ref 0.2–1.2)
Total Protein: 6.2 g/dL (ref 6.1–8.1)
eGFR: 89 mL/min/{1.73_m2} (ref >=60)

## 2023-06-03 LAB — QUANTIFERON-TB GOLD PLUS
Mitogen-NIL: 0.67 [IU]/mL
NIL: 0.07 [IU]/mL
QuantiFERON-TB Gold Plus: NEGATIVE
TB1-NIL: <0 [IU]/mL
TB2-NIL: <0 [IU]/mL

## 2023-06-03 LAB — SEDIMENTATION RATE: Sed Rate: 6 mm/h (ref 0–20)

## 2023-06-08 ENCOUNTER — Other Ambulatory Visit: Payer: Self-pay

## 2023-06-11 ENCOUNTER — Telehealth: Payer: Self-pay | Admitting: Pharmacy Technician

## 2023-06-11 NOTE — Telephone Encounter (Signed)
 Auth Submission: APPROVED Site of care: Site of care: [facility] Payer: [employer] Medication & CPT/J Code(s) submitted: Entyvio (Vedolizumab) [id number] Route of submission (phone, fax, portal):  Phone # Fax # Auth type: Buy/Bill PB Units/visits requested: 300mg  q8wks Reference number: [id number] Approval from: 06/02/23 to 06/01/24

## 2023-06-15 ENCOUNTER — Telehealth: Payer: Self-pay | Admitting: Pharmacy Technician

## 2023-06-15 ENCOUNTER — Ambulatory Visit (INDEPENDENT_AMBULATORY_CARE_PROVIDER_SITE_OTHER): Payer: Commercial Managed Care - PPO

## 2023-06-15 VITALS — BP 115/72 | HR 71 | Temp 98.5°F | Resp 12 | Ht 67.0 in | Wt 170.0 lb

## 2023-06-15 DIAGNOSIS — K5080 Crohn's disease of both small and large intestine without complications: Secondary | ICD-10-CM | POA: Diagnosis not present

## 2023-06-15 MED ORDER — VEDOLIZUMAB 300 MG IV SOLR
300.0000 mg | Freq: Once | INTRAVENOUS | Status: AC
  Administered 2023-06-15: 300 mg via INTRAVENOUS
  Filled 2023-06-15: qty 5

## 2023-06-15 NOTE — Telephone Encounter (Signed)
 Auth Submission: APPROVED Site of care: Site of care: [facility] Payer: [employer] Medication & CPT/J Code(s) submitted: Entyvio (Vedolizumab) [id number] Route of submission (phone, fax, portal):  Phone # Fax # Auth type: Buy/Bill PB Units/visits requested: 300MG  Q4WKS Reference number: [id number] Approval from: 06/02/23 to 06/01/24

## 2023-06-15 NOTE — Progress Notes (Signed)
 Diagnosis: Crohn's Disease  Provider:  Chilton Greathouse MD  Procedure: IV Infusion  IV Type: Peripheral, IV Location: L Forearm  Entyvio (Vedolizumab), Dose: 300 mg  Infusion Start Time: 1000  Infusion Stop Time: 1032  Post Infusion IV Care: Peripheral IV Discontinued  Discharge: Condition: Good, Destination: Home . AVS Declined  Performed by:  Garnette Czech, RN

## 2023-06-16 ENCOUNTER — Encounter: Payer: Self-pay | Admitting: Internal Medicine

## 2023-06-16 ENCOUNTER — Telehealth: Payer: Self-pay

## 2023-06-16 ENCOUNTER — Other Ambulatory Visit (HOSPITAL_COMMUNITY): Payer: Self-pay

## 2023-06-16 NOTE — Telephone Encounter (Signed)
 Pharmacy Patient Advocate Encounter   Received notification from Pt Calls Messages that prior authorization for Entyvio Pen 108MG /0.68ML auto-injectors is required/requested.   Insurance verification completed.   The patient is insured through [facility] .   Per test claim: PA required; PA submitted to above mentioned insurance via CoverMyMeds Key/confirmation #/EOC [id number] Status is pending

## 2023-06-16 NOTE — Telephone Encounter (Signed)
 Monchell please see note below and advise if pt could get the entyvio pen.

## 2023-06-16 NOTE — Telephone Encounter (Signed)
-----   Message from Carie Caddy Pyrtle sent at 06/16/2023 10:16 AM EDT ----- Could we see if patient qualifies for entyvio pen 108 mg every 2 weeks He is currently receiving infusion entyvio Of course we would not stop the infusion until benefits determination on coverage of SQ therapy Thanks JMP

## 2023-06-16 NOTE — Telephone Encounter (Signed)
 PA request has been Submitted. New Encounter has been or will be created for follow up. For additional info see Pharmacy Prior Auth telephone encounter from 06/16/2023.

## 2023-06-18 NOTE — Telephone Encounter (Signed)
 Pharmacy Patient Advocate Encounter  Received notification from [facility] that Prior Authorization for Entyvio Pen 108MG /0.68ML auto-injectors has been DENIED.  Full denial letter will be uploaded to the media tab. See denial reason below.  The following rule(s) must be met for approval:  1) You will NOT use Entyvio concurrently (at the same time) with another systemic biologic (a class of medication, such as Humira) or targeted small molecules (type of medications, including such as JAK (Janus kinase) inhibitors [such as Rinvoq] and PDE-4 (phosphodiesterase-4) inhibitors [such as Otezla]) for an autoimmune indication (a condition where your immune system attacks your own body).   2) You meet ONE (1) of the following:   A) You have previously tried TWO (2) of the following preferred medications: Humira (adalimumab)/adalimumab-adaz/Simlandi, Stelara, Skyrizi, and Rinvoq, OR  B) You have a valid medical reason (a contraindication) why you cannot try ALL of the preferred medications listed above.   Please note that all of the preferred medications may also require a prior authorization and pharmaceutical samples acquired from the prescriber or manufacturer assistance program do not qualify.   PA #/Case ID/Reference #: [id number]

## 2023-06-19 ENCOUNTER — Other Ambulatory Visit (HOSPITAL_COMMUNITY): Payer: Self-pay

## 2023-06-21 NOTE — Telephone Encounter (Signed)
 Please let patient know that Entyvio subcu has been denied and we should just continue with infusions which are covered

## 2023-06-22 ENCOUNTER — Ambulatory Visit (INDEPENDENT_AMBULATORY_CARE_PROVIDER_SITE_OTHER): Payer: BC Managed Care – PPO

## 2023-06-22 ENCOUNTER — Other Ambulatory Visit (HOSPITAL_COMMUNITY): Payer: Self-pay

## 2023-06-22 DIAGNOSIS — I5022 Chronic systolic (congestive) heart failure: Secondary | ICD-10-CM

## 2023-06-23 NOTE — Progress Notes (Unsigned)
 Cardiology Office Note:    Date:  06/24/2023   ID:  Jack [name], DOB 1965/04/28, MRN [id number]  PCP:  Eustaquio Boyden, MD   [facility] Providers Cardiologist:  Lance Muss, MD     Referring MD: Eustaquio Boyden, MD   Chief Complaint  Patient presents with   Congestive Heart Failure  Transition of general cardiology care from Dr. Dola Factor  History of Present Illness:    Jack [name] is a 58 y.o. male with a hx of chronic systolic heart failure (most recent LVEF 30% by echo January 2024, 35-40% by LV angiography in May 2023), CAD, left ventricular apical aneurysm and history of apical thrombus following old anteroseptal MI 2019, previous PCI of LAD (2019) and RCA (May 2023) s/p implantation of ICD (Medtronic cobalt single-chamber, March 2024 by Dr. Lewayne Bunting) who presents to establish general cardiology follow-up.  He has a remote history of significant GI bleed while on warfarin anticoagulation simultaneously with dual antiplatelet therapy.  Additional medical problems include hypertension, a single grandmother seizure no longer on medications, Crohn's disease, history of Churg-Strauss vasculitis, history of intradural meningioma of the lower spine removed surgically.  He has no major complaints, but feels that his overall tolerance to exercise has diminished when compared to a year ago.  His previous angina pectoris with the acute MI in 2019 was a sensation of choking, constriction his upper chest/throat as if he could not swallow his pills.  He has not experienced that recently.  However, his most recent cardiac catheterization and stent in 2023 was precipitated by complaints of shortness of breath rather than his angina.  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  He can climb a flight of stairs and only feel mild shortness of breath at the top.  He has not had  dizziness and has not experienced syncope since his defibrillator was implanted.  He denies orthopnea, PND, lower extremity edema.  He has not had any bleeding problems and his symptoms of Crohn's disease seem to currently be quiescent.  His blood pressure today is quite low at 86/56, but he does not have dizziness or fatigue.  He is on minimal doses of carvedilol, Entresto, spironolactone and takes [place].  He does not take any diuretics.  He is on dual antiplatelet therapy with aspirin and clopidogrel but is no longer on anticoagulation.  His most recent echocardiogram showed resolution of his previous LV apical thrombus.  He is on longstanding chronic steroid therapy with prednisone.  Previous attempts to wean his prednisone to a dose lower than 15 mg have been unsuccessful since he develops recurrent eosinophilic pneumonia symptoms.  He is also taking azathioprine and Entyvio for his eosinophilic pneumonia and Crohn's disease.  His most recent metabolic panel from November 2024 shows an excellent LDL at 47 but also chronically low HDL of 37.  Triglycerides are normal.  His most recent hemoglobin A1c in November was in prediabetes range at 5.9%.  He has normal renal function and normal liver function tests.  Requires pacing.  The wife has not reported a single episode of sustained or nonsustained VT since implantation.  Lead parameters are excellent.  Presenting rhythm is atrial sensed, ventricular sensed (normal sinus rhythm). Optivol has been in normal range for the last 9 months.  Interrogation of his ICD today shows normal device function.  The device is a-year-old and still has roughly 13 years of  estimated longevity.  His rheumatologist is Dr. Sheliah Hatch.  His gastroenterologist is Dr. Rhea Belton.    Past Medical History:  Diagnosis Date   Acquired renal cyst of right kidney 08/31/2018   2.8cm R upper pole rec monitor with yearly imaging (09/2018)   Allergy    BPH (benign prostatic  hypertrophy)    CAD (coronary artery disease)    a. anterior STEMI 01/2018 -  proximal occlusion of LAD, treated with DES. Cath also showed 20% distal LM, 95% ostial-prox small-moderate ramus, 70-80% ostial Cx, 70% dominant ostial OM, 50-60% prox Cx, 70% RCA. EF 35% by cath with LVEDP . Med rx for residual disease. Course complicated by post MI pericarditis and LV thrombus.   Chronic systolic (congestive) heart failure (HCC)    Chronic systolic HF (heart failure) (HCC) 08/07/2021   Colitis    Colon polyp    inflammatory   Crohn disease (HCC) 1992   history uveitis, involvement of intestines and lungs   Diverticulosis    Eosinophilic granuloma (HCC)    Essential hypertension    GERD (gastroesophageal reflux disease)    GI bleeding    History of chicken pox    History of gastroesophageal reflux (GERD)    History of seizure 1995   grand mal x1, completed 6 yrs dilantin. no seizures since   Hyperlipidemia    Internal hemorrhoids    Ischemic cardiomyopathy    LV (left ventricular) mural thrombus following MI (HCC) 01/2018   Myocardial infarction (HCC)    Osteoporosis 11/2015   DEXA T -2.9   Prostate cancer (HCC) 2021   NO TX   Schwannoma 2007   L axilla s/p surgery   Seizures (HCC)    last seizure 1995 and only x 1 seizure-    Ulnar neuropathy    h/o this from L arm schwannoma    Past Surgical History:  Procedure Laterality Date   APPENDECTOMY  2000   BACK SURGERY  2011   lumbar   BIOPSY  01/12/2023   Procedure: BIOPSY;  Surgeon: Beverley Fiedler, MD;  Location: [facility];  Service: Gastroenterology;;   CARDIAC CATHETERIZATION     CARDIOVASCULAR STRESS TEST  01/2015   low risk study   CHOLECYSTECTOMY  2000   COLONOSCOPY  08/2014   f/u crohn's, 4 inflammatory polyps, diverticulosis, improved, rpt 2 yrs ([place])   COLONOSCOPY  11/2018   multiple polyps, 1 TA, rpt 2 yrs (Pyrtle)   COLONOSCOPY WITH PROPOFOL N/A 04/09/2018   inflammatory polyp, f/u left to primary GI -  [place] Myrtie Neither, Starr Lake III, MD)   COLONOSCOPY WITH PROPOFOL N/A 01/12/2023   SSP, chronic active colitis, inflamamtory polyp, rpt 3 yrs ([place], Carie Caddy, MD)   CORONARY ANGIOGRAPHY N/A 02/21/2018   Procedure: CORONARY ANGIOGRAPHY;  Surgeon: Lyn Records, MD;  Location: [facility];  Service: Cardiovascular;  Laterality: N/A;   CORONARY STENT INTERVENTION N/A 08/06/2021   Procedure: CORONARY STENT INTERVENTION;  Surgeon: Lyn Records, MD;  Location: [facility];  Service: Cardiovascular;  Laterality: N/A;   CORONARY/GRAFT ACUTE MI REVASCULARIZATION N/A 02/20/2018   Procedure: Coronary/Graft Acute MI Revascularization;  Surgeon: Lyn Records, MD;  Location: [facility];  Service: Cardiovascular;  Laterality: N/A;   ESOPHAGOGASTRODUODENOSCOPY  07/2014   h/o EE resolved, focal reflux esophagitis, chronic active gastritis   ESOPHAGOGASTRODUODENOSCOPY  11/2018   gastritis, no active crohn's ([place]rtle)   extremity surgery Left    ulnar nerve repair after schwannoma removal  FLEXIBLE SIGMOIDOSCOPY N/A 04/14/2018   Procedure: FLEXIBLE SIGMOIDOSCOPY;  Surgeon: Rachael Fee, MD;  Location: [facility] ENDOSCOPY;  Service: Endoscopy;  Laterality: N/A;   HEMOSTASIS CLIP PLACEMENT  01/12/2023   Procedure: HEMOSTASIS CLIP PLACEMENT;  Surgeon: Beverley Fiedler, MD;  Location: [facility];  Service: Gastroenterology;;   ICD IMPLANT N/A 06/23/2022   Procedure: ICD IMPLANT;  Surgeon: Marinus Maw, MD;  [facility] INVASIVE CV LAB;  Service: Cardiovascular;  Laterality: N/A;   INGUINAL HERNIA REPAIR Bilateral 2017   LAMINECTOMY N/A 08/09/2018   Procedure: Lumbar three Laminectomy, excision of intradural tumor;  Surgeon: Barnett Abu, MD;  Location[facility] OR;  Service: Neurosurgery;  Laterality: N/A;   LEFT HEART CATH AND CORONARY ANGIOGRAPHY N/A 02/20/2018   Procedure: LEFT HEART CATH AND CORONARY ANGIOGRAPHY;  Surgeon: Lyn Records, MD;  Location: [facility];  Service:  Cardiovascular;  Laterality: N/A;   LEFT HEART CATH AND CORONARY ANGIOGRAPHY N/A 08/06/2021   Procedure: LEFT HEART CATH AND CORONARY ANGIOGRAPHY;  Surgeon: Lyn Records, MD;  Location: [facility];  Service: Cardiovascular;  Laterality: N/A;   LUMBAR SPINE SURGERY  2011   POLYPECTOMY  04/09/2018   Procedure: POLYPECTOMY;  Surgeon: Sherrilyn Rist, MD;  Location[facility];  Service: Gastroenterology;;   POLYPECTOMY  01/12/2023   Procedure: POLYPECTOMY;  Surgeon: Beverley Fiedler, MD;  Location: [facility];  Service: Gastroenterology;;   SPINAL CORD STIMULATOR IMPLANT  2012   x2 (Dr Gretta Arab)   SUBMUCOSAL INJECTION  04/09/2018   Procedure: SUBMUCOSAL INJECTION;  Surgeon: Sherrilyn Rist, MD;  Location: [facility] ENDOSCOPY;  Service: Gastroenterology;;   TONSILLECTOMY  1996   TUMOR REMOVAL Left 2007   schwannoma from L armpit   UPPER GASTROINTESTINAL ENDOSCOPY      Current Medications: Current Meds  Medication Sig   alendronate (FOSAMAX) 70 MG tablet Take 1 tablet (70 mg total) by mouth every 7 (seven) days. On Saturday   aspirin EC 81 MG tablet Take 81 mg by mouth daily.   atorvastatin (LIPITOR) 80 MG tablet TAKE 1 TABLET BY MOUTH DAILY AT 6 PM.   azaTHIOprine (IMURAN) 50 MG tablet TAKE 3 TABLETS BY MOUTH AT BEDTIME.   Calcium Carb-Cholecalciferol (CALCIUM + VITAMIN D3) 600-5 MG-MCG TABS Take 1 tablet by mouth daily.   carvedilol (COREG) 3.125 MG tablet TAKE 1 TABLET BY MOUTH 2 TIMES DAILY.   clopidogrel (PLAVIX) 75 MG tablet Take 1 tablet (75 mg total) by mouth daily.   dapagliflozin propanediol (FARXIGA) 10 MG TABS tablet Take 1 tablet (10 mg total) by mouth daily before breakfast.   ezetimibe (ZETIA) 10 MG tablet Take 1 tablet (10 mg total) by mouth daily.   finasteride (PROSCAR) 5 MG tablet Take 1 tablet (5 mg total) by mouth daily.   icosapent Ethyl (VASCEPA) 1 g capsule Take 2 capsules (2 g total) by mouth 2 (two) times daily.   OVER THE COUNTER MEDICATION Apply 1  Application topically daily. Athlete's foot cream   pantoprazole (PROTONIX) 40 MG tablet Take 1 tablet (40 mg total) by mouth daily.   predniSONE (DELTASONE) 1 MG tablet Take 1 tablet (1 mg total) by mouth daily.   predniSONE (DELTASONE) 10 MG tablet Take 1 tablet (10 mg total) by mouth daily with breakfast.   predniSONE (DELTASONE) 5 MG tablet Take 1 tablet (5 mg total) by mouth daily.   sacubitril-valsartan (ENTRESTO) 24-26 MG Take 1 tablet by mouth 2 (two) times daily.   silodosin (RAPAFLO)  8 MG CAPS capsule Take 1 capsule (8 mg total) by mouth daily.   spironolactone (ALDACTONE) 25 MG tablet Take 1/2 tablet (12.5 mg total) by mouth every Monday, Wednesday, and Friday.   tadalafil (CIALIS) 5 MG tablet Take 1 tablet (5 mg total) by mouth daily.   vedolizumab (ENTYVIO) 300 MG injection Inject 300 mg into the vein every 28 (twenty-eight) days.     Allergies:   Cymbalta [duloxetine hcl], Infliximab, and Keflex [cephalexin]   Family History: The patient's family history includes CAD (age of onset: 62) in his father and mother; Colon cancer (age of onset: 47) in his maternal grandmother; Colon polyps in his maternal grandmother; Congenital heart disease in his mother; Crohn's disease in his brother; Heart disease in his father and mother; Hyperlipidemia in his father and mother; Hypertension in his father and mother. There is no history of Diabetes, Esophageal cancer, Rectal cancer, or Stomach cancer.  ROS:   Please see the history of present illness.     All other systems reviewed and are negative.  EKGs/Labs/Other Studies Reviewed:    The following studies were reviewed today:  EKG Interpretation Date/Time:  Wednesday June 24 2023 08:07:37 EDT Ventricular Rate:  64 PR Interval:  128 QRS Duration:  112 QT Interval:  410 QTC Calculation: 422 R Axis:   40  Text Interpretation: Normal sinus rhythm Septal infarct (cited on or before 20-Feb-2018) ST & T wave abnormality, consider  anterolateral ischemia When compared with ECG of 25-Sep-2022 14:06, T wave amplitude has decreased in Inferior leads T wave inversion now evident in Anterior leads Confirmed by Kawanna Christley ([id number]) on 06/24/2023 8:19:44 AM         Echocardiogram 04/22/2022:   1. Left ventricular ejection fraction, by estimation, is 30%. The left  ventricle has moderately decreased function. The left ventricle  demonstrates regional wall motion abnormalities (see scoring  diagram/findings for description) - aneurysmal change of  the left ventricular anteroseptum involving the anterior wall. There is  mild left ventricular hypertrophy. Left ventricular diastolic parameters  are consistent with Grade I diastolic dysfunction (impaired relaxation).   2. Right ventricular systolic function is normal. The right ventricular  size is normal. Tricuspid regurgitation signal is inadequate for assessing  PA pressure.   3. Left atrial size was mildly dilated.   4. The mitral valve is grossly normal. Mild mitral valve regurgitation.  No evidence of mitral stenosis.   5. The aortic valve is tricuspid. Aortic valve regurgitation is not  visualized. No aortic stenosis is present.   6. The inferior vena cava is normal in size with greater than 50%  respiratory variability, suggesting right atrial pressure of 3 mmHg.   Comparison(s): A prior study was performed on 06/28/21. Prior images  reviewed side by side. No significant change since prior despite  differences in reporting.   Conclusion(s)/Recommendation(s): No left ventricular mural or apical  thrombus/thrombi.   Cardiac catheterization 08/06/2021:  CONCLUSIONS: 99% mid to distal RCA with TIMI grade II treated with PCI and stent using a 3.5 x 26 Onyx postdilated to 4.0 mm at high pressure and resulting in 0% stenosis and TIMI grade III flow. Left main is patent. Ostial proximal LAD 40 to 50% narrowed Ostial circumflex 50% Mid circumflex and first obtuse  marginal [place] 111 stenosis 50%, 50%, 50%. Severe anterior wall hypokinesis.  EF 35 to 40%.  Normal LVEDP, thus findings are consistent with chronic systolic heart failure, ischemic cardiomyopathy.   RECOMMENDATIONS:   Continue aspirin and Plavix  dual antiplatelet therapy for 12 months then return to clopidogrel monotherapy.  He has history of GI bleeding on dual antiplatelet therapy so and tendons will be up for any evidence of bleeding. Continue aggressive lipid-lowering with Zetia, icosapent ethyl, and high intensity statin therapy. Continue guideline directed quadruple therapy for systolic heart failure. Monitor overnight and discharge in a.m. if no complications.  Recent Labs: 06/01/2023: ALT 28; BUN 25; Creat 1.00; Hemoglobin 14.1; Platelets 261; Potassium 4.1; Sodium 142  Recent Lipid Panel    Component Value Date/Time   CHOL 99 02/04/2023 0810   CHOL 116 11/25/2021 0858   TRIG 80.0 02/04/2023 0810   HDL 36.60 (L) 02/04/2023 0810   HDL 43 11/25/2021 0858   CHOLHDL 3 02/04/2023 0810   VLDL 16.0 02/04/2023 0810   LDLCALC 47 02/04/2023 0810   LDLCALC 58 11/25/2021 0858     Risk Assessment/Calculations:                Physical Exam:    VS:  BP (!) 86/56 (BP Location: Left Arm, Patient Position: Sitting, Cuff Size: Normal)   Pulse 64   Ht 5\' 7"  (1.702 m)   Wt 170 lb 3.2 oz (77.2 kg)   SpO2 97%   BMI 26.66 kg/m     Wt Readings from Last 3 Encounters:  06/24/23 170 lb 3.2 oz (77.2 kg)  06/15/23 170 lb (77.1 kg)  06/01/23 171 lb (77.6 kg)     GEN: Appears comfortable, well nourished, well developed in no acute distress HEENT: Normal NECK: No JVD; No carotid bruits LYMPHATICS: No lymphadenopathy CARDIAC: RRR, no murmurs, rubs, gallops RESPIRATORY:  Clear to auscultation without rales, wheezing or rhonchi  ABDOMEN: Soft, non-tender, non-distended MUSCULOSKELETAL:  No edema; No deformity  SKIN: Warm and dry NEUROLOGIC:  Alert and oriented x 3 PSYCHIATRIC:   Normal affect   ASSESSMENT:    1. Essential hypertension   2. Chronic combined systolic and diastolic heart failure (HCC)    PLAN:    In order of problems listed above:  CHF: NYHA functional class I (although not as good as he thinks he was a year ago), clinically euvolemic.  He is on comprehensive guideline directed medical therapy for heart failure, but the doses of his heart failure medications cannot be increased due to low blood pressure. CAD: He had angina with his original infarction in 2019, but his second coronary intervention was performed for complaints of reduced stamina/shortness of breath.  Does not have angina currently, but does feel that his overall exercise tolerance has diminished.  Will start with an echocardiogram, but if his symptoms do not improve or continue to worsen we may have to perform a diagnostic left heart catheterization.  He is still on dual antiplatelet therapy. LV aneurysm: Reevaluate by echocardiography, look for recurrence of LV thrombus.  He is no longer on anticoagulants. HLP: HDL cholesterol is a little low, but all other lipid parameters are excellent.  Continue current lipid-lowering therapy with ezetimibe and atorvastatin. HTN: Currently mildly hypotensive.  He is tolerating his low blood pressure without any complaints. Crohn's disease: Clinically quiescent, followed by Dr. Rhea Belton.  On azathioprine, Entyvio, prednisone Churg-Strauss vasculitis: Followed by Dr. Sheliah Hatch.  Very slowly trying to wean down his relatively high dose of maintenance steroids. ICD: Followed by Dr. Ladona Ridgel.  Has not received therapies from the device and the device has not recorded any nonsustained VT in the last year.  Thoracic impedance has been in normal range without signs of hypervolemia  for at least the last 9 months.   Iatrogenic adrenal insufficiency: He should receive stress dose hydrocortisone for persistent hypotension in the setting of acute illness.            Medication Adjustments/Labs and Tests Ordered: Current medicines are reviewed at length with the patient today.  Concerns regarding medicines are outlined above.  Orders Placed This Encounter  Procedures   EKG 12-Lead   ECHOCARDIOGRAM COMPLETE   No orders of the defined types were placed in this encounter.   Patient Instructions  Medication Instructions:  No changes *If you need a refill on your cardiac medications before your next appointment, please call your pharmacy*  Testing/Procedures: Your physician has requested that you have an echocardiogram. Echocardiography is a painless test that uses sound waves to create images of your heart. It provides your doctor with information about the size and shape of your heart and how well your heart's chambers and valves are working. This procedure takes approximately one hour. There are no restrictions for this procedure. Please do NOT wear cologne, perfume, aftershave, or lotions (deodorant is allowed). Please arrive 15 minutes prior to your appointment time.  Please note: We ask at that you not bring children with you during ultrasound (echo/ vascular) testing. Due to room size and safety concerns, children are not allowed in the ultrasound rooms during exams. Our front office staff cannot provide observation of children in our lobby area while testing is being conducted. An adult accompanying a patient to their appointment will only be allowed in the ultrasound room at the discretion of the ultrasound technician under special circumstances. We apologize for any inconvenience.    Follow-Up: At [facility], you and your health needs are our priority.  As part of our continuing mission to provide you with exceptional heart care, we have created designated Provider Care Teams.  These Care Teams include your primary Cardiologist (physician) and Advanced Practice Providers (APPs -  Physician Assistants and Nurse Practitioners) who all  work together to provide you with the care you need, when you need it.  We recommend signing up for the patient portal called "MyChart".  Sign up information is provided on this After Visit Summary.  MyChart is used to connect with patients for Virtual Visits (Telemedicine).  Patients are able to view lab/test results, encounter notes, upcoming appointments, etc.  Non-urgent messages can be sent to your provider as well.   To learn more about what you can do with MyChart, go to [email or web address].    Your next appointment:   1 year(s)  Provider:   Dr Royann Shivers  s        Signed, Thurmon Fair, MD  06/24/2023 8:46 AM    [facility]

## 2023-06-24 ENCOUNTER — Ambulatory Visit: Payer: Commercial Managed Care - PPO | Attending: Cardiovascular Disease | Admitting: Cardiovascular Disease

## 2023-06-24 ENCOUNTER — Encounter: Payer: Self-pay | Admitting: Cardiovascular Disease

## 2023-06-24 VITALS — BP 86/56 | HR 64 | Ht 67.0 in | Wt 170.2 lb

## 2023-06-24 DIAGNOSIS — I10 Essential (primary) hypertension: Secondary | ICD-10-CM

## 2023-06-24 DIAGNOSIS — I5042 Chronic combined systolic (congestive) and diastolic (congestive) heart failure: Secondary | ICD-10-CM

## 2023-06-24 LAB — CUP PACEART REMOTE DEVICE CHECK
Battery Remaining Longevity: 158 mo
Battery Voltage: 3.02 V
Brady Statistic RV Percent Paced: 0 %
Date Time Interrogation Session: 20250325104122
HighPow Impedance: 65 Ohm
Implantable Lead Connection Status: 753985
Implantable Lead Implant Date: 20240325
Implantable Lead Location: 753860
Implantable Lead Model: 6935
Implantable Pulse Generator Implant Date: 20240325
Lead Channel Impedance Value: 513 Ohm
Lead Channel Impedance Value: 608 Ohm
Lead Channel Pacing Threshold Amplitude: 0.5 V
Lead Channel Pacing Threshold Pulse Width: 0.4 ms
Lead Channel Sensing Intrinsic Amplitude: 20.4 mV
Lead Channel Setting Pacing Amplitude: 2 V
Lead Channel Setting Pacing Pulse Width: 0.4 ms
Lead Channel Setting Sensing Sensitivity: 0.3 mV
Zone Setting Status: 755011

## 2023-06-24 NOTE — Patient Instructions (Signed)
 Medication Instructions:  No changes *If you need a refill on your cardiac medications before your next appointment, please call your pharmacy*  Testing/Procedures: Your physician has requested that you have an echocardiogram. Echocardiography is a painless test that uses sound waves to create images of your heart. It provides your doctor with information about the size and shape of your heart and how well your heart's chambers and valves are working. This procedure takes approximately one hour. There are no restrictions for this procedure. Please do NOT wear cologne, perfume, aftershave, or lotions (deodorant is allowed). Please arrive 15 minutes prior to your appointment time.  Please note: We ask at that you not bring children with you during ultrasound (echo/ vascular) testing. Due to room size and safety concerns, children are not allowed in the ultrasound rooms during exams. Our front office staff cannot provide observation of children in our lobby area while testing is being conducted. An adult accompanying a patient to their appointment will only be allowed in the ultrasound room at the discretion of the ultrasound technician under special circumstances. We apologize for any inconvenience.    Follow-Up: At [facility], you and your health needs are our priority.  As part of our continuing mission to provide you with exceptional heart care, we have created designated Provider Care Teams.  These Care Teams include your primary Cardiologist (physician) and Advanced Practice Providers (APPs -  Physician Assistants and Nurse Practitioners) who all work together to provide you with the care you need, when you need it.  We recommend signing up for the patient portal called "MyChart".  Sign up information is provided on this After Visit Summary.  MyChart is used to connect with patients for Virtual Visits (Telemedicine).  Patients are able to view lab/test results, encounter notes, upcoming  appointments, etc.  Non-urgent messages can be sent to your provider as well.   To learn more about what you can do with MyChart, go to [email or web address].    Your next appointment:   1 year(s)  Provider:   Dr Royann Shivers  s

## 2023-06-25 ENCOUNTER — Encounter: Payer: Self-pay | Admitting: Internal Medicine

## 2023-06-26 ENCOUNTER — Other Ambulatory Visit: Payer: Self-pay

## 2023-06-26 ENCOUNTER — Other Ambulatory Visit (HOSPITAL_COMMUNITY): Payer: Self-pay

## 2023-06-29 ENCOUNTER — Encounter: Payer: Self-pay | Admitting: Internal Medicine

## 2023-07-08 ENCOUNTER — Other Ambulatory Visit: Payer: Self-pay | Admitting: Interventional Cardiology

## 2023-07-08 ENCOUNTER — Other Ambulatory Visit: Payer: Self-pay

## 2023-07-08 ENCOUNTER — Other Ambulatory Visit (HOSPITAL_COMMUNITY): Payer: Self-pay

## 2023-07-08 MED ORDER — ATORVASTATIN CALCIUM 80 MG PO TABS
80.0000 mg | ORAL_TABLET | Freq: Every evening | ORAL | 3 refills | Status: AC
  Filled 2023-07-08: qty 90, 90d supply, fill #0
  Filled 2023-09-23: qty 90, 90d supply, fill #1
  Filled 2024-01-13: qty 90, 90d supply, fill #2
  Filled 2024-04-21: qty 90, 90d supply, fill #3

## 2023-07-08 MED ORDER — SILODOSIN 8 MG PO CAPS
8.0000 mg | ORAL_CAPSULE | Freq: Every day | ORAL | 1 refills | Status: DC
  Filled 2023-07-08: qty 30, 30d supply, fill #0
  Filled 2023-08-03: qty 30, 30d supply, fill #1

## 2023-07-08 MED ORDER — TADALAFIL 5 MG PO TABS
5.0000 mg | ORAL_TABLET | Freq: Every day | ORAL | 1 refills | Status: DC
  Filled 2023-07-27: qty 30, 30d supply, fill #0
  Filled 2023-08-24: qty 30, 30d supply, fill #1

## 2023-07-08 MED ORDER — FINASTERIDE 5 MG PO TABS
5.0000 mg | ORAL_TABLET | Freq: Every day | ORAL | 1 refills | Status: DC
  Filled 2023-07-08: qty 30, 30d supply, fill #0
  Filled 2023-08-03: qty 30, 30d supply, fill #1

## 2023-07-09 ENCOUNTER — Other Ambulatory Visit (HOSPITAL_COMMUNITY): Payer: Self-pay

## 2023-07-10 ENCOUNTER — Other Ambulatory Visit (HOSPITAL_COMMUNITY): Payer: Self-pay

## 2023-07-10 MED ORDER — FINASTERIDE 5 MG PO TABS
5.0000 mg | ORAL_TABLET | Freq: Every day | ORAL | 0 refills | Status: DC
  Filled 2023-07-10 – 2023-09-01 (×2): qty 90, 90d supply, fill #0

## 2023-07-13 ENCOUNTER — Ambulatory Visit (INDEPENDENT_AMBULATORY_CARE_PROVIDER_SITE_OTHER)

## 2023-07-13 VITALS — BP 149/83 | HR 69 | Temp 97.9°F | Resp 16 | Ht 67.0 in | Wt 167.4 lb

## 2023-07-13 DIAGNOSIS — K5080 Crohn's disease of both small and large intestine without complications: Secondary | ICD-10-CM | POA: Diagnosis not present

## 2023-07-13 MED ORDER — VEDOLIZUMAB 300 MG IV SOLR
300.0000 mg | Freq: Once | INTRAVENOUS | Status: AC
  Administered 2023-07-13: 300 mg via INTRAVENOUS
  Filled 2023-07-13: qty 5

## 2023-07-13 NOTE — Progress Notes (Signed)
 Diagnosis: Crohn's Disease  Provider:  Praveen Mannam MD  Procedure: IV Infusion  IV Type: Peripheral, IV Location: L Forearm  Entyvio (Vedolizumab), Dose: 300 mg  Infusion Start Time: 0922  Infusion Stop Time: 1000  Post Infusion IV Care: Peripheral IV Discontinued  Discharge: Condition: Good, Destination: Home . AVS Declined  Performed by:  Natividad Balding, RN

## 2023-07-15 ENCOUNTER — Other Ambulatory Visit (HOSPITAL_COMMUNITY): Payer: Self-pay

## 2023-07-15 ENCOUNTER — Other Ambulatory Visit: Payer: Self-pay | Admitting: Interventional Cardiology

## 2023-07-15 MED ORDER — ICOSAPENT ETHYL 1 G PO CAPS
2.0000 g | ORAL_CAPSULE | Freq: Two times a day (BID) | ORAL | 11 refills | Status: AC
  Filled 2023-07-25: qty 120, 30d supply, fill #0
  Filled 2023-08-26: qty 120, 30d supply, fill #1
  Filled 2023-09-23: qty 120, 30d supply, fill #2
  Filled 2023-10-27: qty 120, 30d supply, fill #3
  Filled 2023-11-24: qty 120, 30d supply, fill #4
  Filled 2023-12-23: qty 120, 30d supply, fill #5
  Filled 2024-01-28: qty 120, 30d supply, fill #6
  Filled 2024-02-23: qty 120, 30d supply, fill #7
  Filled 2024-03-23: qty 120, 30d supply, fill #8
  Filled 2024-04-28: qty 360, 90d supply, fill #9

## 2023-07-21 ENCOUNTER — Other Ambulatory Visit: Payer: Self-pay | Admitting: Interventional Cardiology

## 2023-07-23 ENCOUNTER — Other Ambulatory Visit (HOSPITAL_COMMUNITY): Payer: Self-pay

## 2023-07-23 MED ORDER — SPIRONOLACTONE 25 MG PO TABS
12.5000 mg | ORAL_TABLET | ORAL | 3 refills | Status: AC
  Filled 2023-07-23: qty 18, 84d supply, fill #0
  Filled 2023-10-13: qty 18, 84d supply, fill #1
  Filled 2024-01-05 – 2024-01-16 (×2): qty 18, 84d supply, fill #2
  Filled 2024-04-05: qty 18, 84d supply, fill #3

## 2023-07-26 ENCOUNTER — Other Ambulatory Visit: Payer: Self-pay

## 2023-07-27 ENCOUNTER — Other Ambulatory Visit (HOSPITAL_COMMUNITY): Payer: Self-pay

## 2023-08-04 ENCOUNTER — Other Ambulatory Visit (HOSPITAL_COMMUNITY): Payer: Self-pay

## 2023-08-04 ENCOUNTER — Other Ambulatory Visit: Payer: Self-pay | Admitting: Cardiovascular Disease

## 2023-08-04 ENCOUNTER — Ambulatory Visit (HOSPITAL_COMMUNITY)
Admission: RE | Admit: 2023-08-04 | Discharge: 2023-08-04 | Disposition: A | Discharge status: Home / Self Care | Source: Ambulatory Visit | Attending: Cardiovascular Disease | Admitting: Cardiovascular Disease

## 2023-08-04 ENCOUNTER — Encounter: Payer: Self-pay | Admitting: Cardiovascular Disease

## 2023-08-04 DIAGNOSIS — I5042 Chronic combined systolic (congestive) and diastolic (congestive) heart failure: Secondary | ICD-10-CM | POA: Diagnosis not present

## 2023-08-04 LAB — ECHOCARDIOGRAM COMPLETE
AR max vel: 2.64 cm2
AV Area VTI: 2.79 cm2
AV Area mean vel: 2.32 cm2
AV Mean grad: 3 mmHg
AV Peak grad: 5 mmHg
Ao pk vel: 1.12 m/s
Area-P 1/2: 2.83 cm2
S' Lateral: 4.24 cm

## 2023-08-04 MED ORDER — CLOPIDOGREL BISULFATE 75 MG PO TABS
75.0000 mg | ORAL_TABLET | Freq: Every day | ORAL | 0 refills | Status: DC
  Filled 2023-08-04: qty 90, 90d supply, fill #0

## 2023-08-04 MED ORDER — PERFLUTREN LIPID MICROSPHERE
1.0000 mL | INTRAVENOUS | Status: AC | PRN
  Administered 2023-08-04: 6 mL via INTRAVENOUS

## 2023-08-06 ENCOUNTER — Other Ambulatory Visit: Payer: Self-pay | Admitting: Family Medicine

## 2023-08-06 DIAGNOSIS — F5104 Psychophysiologic insomnia: Secondary | ICD-10-CM

## 2023-08-07 ENCOUNTER — Other Ambulatory Visit: Payer: Self-pay | Admitting: Cardiovascular Disease

## 2023-08-07 ENCOUNTER — Other Ambulatory Visit (HOSPITAL_COMMUNITY): Payer: Self-pay

## 2023-08-07 MED ORDER — DAPAGLIFLOZIN PROPANEDIOL 10 MG PO TABS
10.0000 mg | ORAL_TABLET | Freq: Every day | ORAL | 3 refills | Status: AC
  Filled 2023-08-07: qty 90, 90d supply, fill #0
  Filled 2023-11-05: qty 90, 90d supply, fill #1
  Filled 2024-02-01: qty 90, 90d supply, fill #2
  Filled 2024-05-01: qty 90, 90d supply, fill #3
  Filled 2024-05-03 – 2024-05-04 (×2): qty 90, 90d supply, fill #0

## 2023-08-07 MED ORDER — AMITRIPTYLINE HCL 25 MG PO TABS
25.0000 mg | ORAL_TABLET | Freq: Every evening | ORAL | 1 refills | Status: DC | PRN
  Filled 2023-08-07: qty 90, 90d supply, fill #0
  Filled 2023-10-27: qty 90, 90d supply, fill #1

## 2023-08-08 ENCOUNTER — Other Ambulatory Visit: Payer: Self-pay | Admitting: Physician Assistant

## 2023-08-08 ENCOUNTER — Other Ambulatory Visit: Payer: Self-pay | Admitting: Interventional Cardiology

## 2023-08-10 ENCOUNTER — Other Ambulatory Visit: Payer: Self-pay

## 2023-08-10 ENCOUNTER — Other Ambulatory Visit (HOSPITAL_COMMUNITY): Payer: Self-pay

## 2023-08-10 ENCOUNTER — Ambulatory Visit (INDEPENDENT_AMBULATORY_CARE_PROVIDER_SITE_OTHER)

## 2023-08-10 VITALS — BP 118/72 | HR 60 | Temp 97.5°F | Resp 16 | Ht 67.0 in | Wt 168.6 lb

## 2023-08-10 DIAGNOSIS — K5080 Crohn's disease of both small and large intestine without complications: Secondary | ICD-10-CM

## 2023-08-10 MED ORDER — ENTRESTO 24-26 MG PO TABS
1.0000 | ORAL_TABLET | Freq: Two times a day (BID) | ORAL | 3 refills | Status: AC
  Filled 2023-08-10: qty 180, 90d supply, fill #0
  Filled 2023-11-03: qty 180, 90d supply, fill #1
  Filled 2024-02-01: qty 180, 90d supply, fill #2
  Filled 2024-04-29: qty 180, 90d supply, fill #3

## 2023-08-10 MED ORDER — CARVEDILOL 3.125 MG PO TABS
3.1250 mg | ORAL_TABLET | Freq: Two times a day (BID) | ORAL | 3 refills | Status: AC
  Filled 2023-08-10: qty 180, 90d supply, fill #0
  Filled 2023-11-03: qty 180, 90d supply, fill #1
  Filled 2024-02-01: qty 180, 90d supply, fill #2
  Filled 2024-04-29: qty 180, 90d supply, fill #3

## 2023-08-10 MED ORDER — VEDOLIZUMAB 300 MG IV SOLR
300.0000 mg | Freq: Once | INTRAVENOUS | Status: AC
  Administered 2023-08-10: 300 mg via INTRAVENOUS
  Filled 2023-08-10: qty 5

## 2023-08-10 NOTE — Addendum Note (Signed)
 Addended by: Edra Govern D on: 08/10/2023 03:36 PM   Modules accepted: Orders

## 2023-08-10 NOTE — Progress Notes (Signed)
 Remote ICD transmission.

## 2023-08-10 NOTE — Progress Notes (Signed)
 Diagnosis: Crohn's Disease  Provider:  Praveen Mannam MD  Procedure: IV Infusion  IV Type: Peripheral, IV Location: R Antecubital  Entyvio  (Vedolizumab ), Dose: 300 mg  Infusion Start Time: 0918  Infusion Stop Time: 1006  Post Infusion IV Care: Peripheral IV Discontinued  Discharge: Condition: Good, Destination: Home . AVS Declined  Performed by:  Lauran Pollard, LPN

## 2023-08-16 ENCOUNTER — Other Ambulatory Visit: Payer: Self-pay | Admitting: Internal Medicine

## 2023-08-17 NOTE — Telephone Encounter (Signed)
 Please schedule patient a follow up visit. Patient due 09/01/2023. Thanks!

## 2023-08-17 NOTE — Telephone Encounter (Signed)
LMOM for patient to schedule follow up appointment. °

## 2023-08-17 NOTE — Telephone Encounter (Signed)
 Last Fill: 05/12/2023  Labs: 06/01/2023 Absolute Monocytes 1,041  Next Visit: Due 09/01/2023. Message sent to the front to schedule.   Last Visit: 06/01/2023  DX: Osteoporosis   Current Dose per office note 06/01/2023: Fosamax  70 mg p.o. weekly   Okay to refill Fosamax ?

## 2023-08-25 DIAGNOSIS — C61 Malignant neoplasm of prostate: Secondary | ICD-10-CM | POA: Diagnosis not present

## 2023-08-26 ENCOUNTER — Other Ambulatory Visit (HOSPITAL_COMMUNITY): Payer: Self-pay

## 2023-09-01 ENCOUNTER — Other Ambulatory Visit (HOSPITAL_COMMUNITY): Payer: Self-pay

## 2023-09-01 ENCOUNTER — Other Ambulatory Visit (HOSPITAL_BASED_OUTPATIENT_CLINIC_OR_DEPARTMENT_OTHER): Payer: Self-pay

## 2023-09-01 ENCOUNTER — Other Ambulatory Visit: Payer: Self-pay

## 2023-09-01 DIAGNOSIS — C61 Malignant neoplasm of prostate: Secondary | ICD-10-CM | POA: Diagnosis not present

## 2023-09-01 DIAGNOSIS — N401 Enlarged prostate with lower urinary tract symptoms: Secondary | ICD-10-CM | POA: Diagnosis not present

## 2023-09-01 DIAGNOSIS — R351 Nocturia: Secondary | ICD-10-CM | POA: Diagnosis not present

## 2023-09-01 DIAGNOSIS — R3912 Poor urinary stream: Secondary | ICD-10-CM | POA: Diagnosis not present

## 2023-09-01 MED ORDER — SILODOSIN 8 MG PO CAPS
8.0000 mg | ORAL_CAPSULE | Freq: Every day | ORAL | 1 refills | Status: DC
  Filled 2023-09-01: qty 30, 30d supply, fill #0

## 2023-09-01 MED ORDER — LEVOFLOXACIN 750 MG PO TABS
750.0000 mg | ORAL_TABLET | Freq: Every day | ORAL | 0 refills | Status: AC
  Filled 2023-09-01: qty 1, 1d supply, fill #0

## 2023-09-01 MED ORDER — FINASTERIDE 5 MG PO TABS
5.0000 mg | ORAL_TABLET | Freq: Every day | ORAL | 3 refills | Status: AC
  Filled 2023-09-01 – 2023-11-30 (×2): qty 90, 90d supply, fill #0
  Filled 2024-02-23: qty 90, 90d supply, fill #1

## 2023-09-01 MED ORDER — SILODOSIN 8 MG PO CAPS
8.0000 mg | ORAL_CAPSULE | Freq: Every day | ORAL | 3 refills | Status: DC
  Filled 2023-09-01: qty 90, 90d supply, fill #0
  Filled 2023-11-30: qty 90, 90d supply, fill #1

## 2023-09-01 MED ORDER — TADALAFIL 5 MG PO TABS
5.0000 mg | ORAL_TABLET | Freq: Every day | ORAL | 3 refills | Status: AC
  Filled 2023-09-21: qty 90, 90d supply, fill #0
  Filled 2023-12-18: qty 90, 90d supply, fill #1
  Filled 2024-03-16: qty 90, 90d supply, fill #2

## 2023-09-02 ENCOUNTER — Telehealth: Payer: Self-pay | Admitting: *Deleted

## 2023-09-02 NOTE — Telephone Encounter (Signed)
Left message for pt to call back to schedule a tele pre op appt.  

## 2023-09-02 NOTE — Telephone Encounter (Signed)
   Name: Jack [name]  DOB: November 27, 1965  MRN: [id number]  Primary Cardiologist: Avery Bodo, MD   Preoperative team, please contact this patient and set up a phone call appointment for further preoperative risk assessment. Please obtain consent and complete medication review. Thank you for your help.  I confirm that guidance regarding antiplatelet and oral anticoagulation therapy has been completed and, if necessary, noted below.  -Patient with history of remote stenting. Okay to hold Plavix  for  5 days prior to the procedure. Would prefer patient be on ASA 81 mg while off Plavix . Can discontinue once Plavix  is resumed.   I also confirmed the patient resides in the state of [place] . As per [facility] Medical Board telemedicine laws, the patient must reside in the state in which the provider is licensed.   Francene Ing, Retha Cast, NP 09/02/2023, 9:57 AM [facility]

## 2023-09-02 NOTE — Telephone Encounter (Signed)
   Pre-operative Risk Assessment    Patient Name: Jack [name]  DOB: 08-Aug-1965 MRN: [id number]   Date of last office visit: 06/24/23 DR. CROITORU Date of next office visit: NONE   Request for Surgical Clearance    Procedure:  PROSTATE U/S Bx   Date of Surgery:  Clearance 10/16/23                                Surgeon:  DR. Leila Punt Surgeon's Group or Practice Name:  [facility] Phone number:  [phone] Fax number:  [phone]   Type of Clearance Requested:   - Medical  - Pharmacy:  Hold Clopidogrel  (Plavix ) x 5 DAYS PRIOR   Type of Anesthesia:  Not Indicated   Additional requests/questions:    Princeton Broom   09/02/2023, 9:41 AM

## 2023-09-04 NOTE — Telephone Encounter (Signed)
 2nd attempt: Left message for pt to call our office and ask for preop team to schedule TELE Preop appt.

## 2023-09-07 ENCOUNTER — Ambulatory Visit

## 2023-09-07 VITALS — BP 116/73 | HR 69 | Temp 97.9°F | Resp 18 | Ht 67.0 in | Wt 167.2 lb

## 2023-09-07 DIAGNOSIS — K5080 Crohn's disease of both small and large intestine without complications: Secondary | ICD-10-CM | POA: Diagnosis not present

## 2023-09-07 MED ORDER — VEDOLIZUMAB 300 MG IV SOLR
300.0000 mg | Freq: Once | INTRAVENOUS | Status: AC
  Administered 2023-09-07: 300 mg via INTRAVENOUS
  Filled 2023-09-07: qty 5

## 2023-09-07 NOTE — Progress Notes (Signed)
 Diagnosis: Crohn's Disease  Provider:  Mannam, Praveen MD  Procedure: IV Infusion  IV Type: Peripheral, IV Location: L Forearm  Entyvio  (Vedolizumab ), Dose: 300 mg  Infusion Start Time: 0904  Infusion Stop Time: 0935  Post Infusion IV Care: Peripheral IV Discontinued  Discharge: Condition: Stable, Destination: Home . AVS Declined  Performed by:  Lendel Quant, RN

## 2023-09-08 NOTE — Telephone Encounter (Signed)
 3rd attempt to reach the pt to schedule a tele preop appt. I will update the surgeon's office pt needs to call to schedule a tele visit for preop clearance.   Will remove from the preop call back pool at this time.

## 2023-09-09 ENCOUNTER — Telehealth: Payer: Self-pay | Admitting: *Deleted

## 2023-09-09 NOTE — Telephone Encounter (Signed)
 Pt has been scheduled tele preop appt 09/30/23. Med rec and consent are done.

## 2023-09-09 NOTE — Telephone Encounter (Signed)
 Pt returning call, requesting cb

## 2023-09-09 NOTE — Telephone Encounter (Signed)
 Pt has been scheduled tele preop appt 09/30/23. Med rec and consent are done.      Patient Consent for Virtual Visit        Jack [name] has provided verbal consent on 09/09/2023 for a virtual visit (video or telephone).   CONSENT FOR VIRTUAL VISIT FOR:  Jack [name]  By participating in this virtual visit I agree to the following:  I hereby voluntarily request, consent and authorize [facility] and its employed or contracted physicians, physician assistants, nurse practitioners or other licensed health care professionals (the Practitioner), to provide me with telemedicine health care services (the "Services) as deemed necessary by the treating Practitioner. I acknowledge and consent to receive the Services by the Practitioner via telemedicine. I understand that the telemedicine visit will involve communicating with the Practitioner through live audiovisual communication technology and the disclosure of certain medical information by electronic transmission. I acknowledge that I have been given the opportunity to request an in-person assessment or other available alternative prior to the telemedicine visit and am voluntarily participating in the telemedicine visit.  I understand that I have the right to withhold or withdraw my consent to the use of telemedicine in the course of my care at any time, without affecting my right to future care or treatment, and that the Practitioner or I may terminate the telemedicine visit at any time. I understand that I have the right to inspect all information obtained and/or recorded in the course of the telemedicine visit and may receive copies of available information for a reasonable fee.  I understand that some of the potential risks of receiving the Services via telemedicine include:  Delay or interruption in medical evaluation due to technological equipment failure or disruption; Information transmitted may not be sufficient (e.g. poor  resolution of images) to allow for appropriate medical decision making by the Practitioner; and/or  In rare instances, security protocols could fail, causing a breach of personal health information.  Furthermore, I acknowledge that it is my responsibility to provide information about my medical history, conditions and care that is complete and accurate to the best of my ability. I acknowledge that Practitioner's advice, recommendations, and/or decision may be based on factors not within their control, such as incomplete or inaccurate data provided by me or distortions of diagnostic images or specimens that may result from electronic transmissions. I understand that the practice of medicine is not an exact science and that Practitioner makes no warranties or guarantees regarding treatment outcomes. I acknowledge that a copy of this consent can be made available to me via my patient portal [facility] MyChart), or I can request a printed copy by calling the office of [facility].    I understand that my insurance will be billed for this visit.   I have read or had this consent read to me. I understand the contents of this consent, which adequately explains the benefits and risks of the Services being provided via telemedicine.  I have been provided ample opportunity to ask questions regarding this consent and the Services and have had my questions answered to my satisfaction. I give my informed consent for the services to be provided through the use of telemedicine in my medical care

## 2023-09-10 ENCOUNTER — Other Ambulatory Visit: Payer: Self-pay | Admitting: Internal Medicine

## 2023-09-10 ENCOUNTER — Other Ambulatory Visit (HOSPITAL_COMMUNITY): Payer: Self-pay

## 2023-09-10 ENCOUNTER — Other Ambulatory Visit: Payer: Self-pay

## 2023-09-10 MED ORDER — AZATHIOPRINE 50 MG PO TABS
150.0000 mg | ORAL_TABLET | Freq: Every day | ORAL | 1 refills | Status: DC
Start: 2023-09-10 — End: 2023-09-10
  Filled 2023-09-10: qty 270, 90d supply, fill #0

## 2023-09-10 MED ORDER — AZATHIOPRINE 50 MG PO TABS
150.0000 mg | ORAL_TABLET | Freq: Every day | ORAL | 1 refills | Status: DC
  Filled 2023-09-10: qty 270, 90d supply, fill #0
  Filled 2023-12-08: qty 270, 90d supply, fill #1

## 2023-09-21 ENCOUNTER — Other Ambulatory Visit (HOSPITAL_COMMUNITY): Payer: Self-pay

## 2023-09-21 ENCOUNTER — Ambulatory Visit (INDEPENDENT_AMBULATORY_CARE_PROVIDER_SITE_OTHER): Payer: BC Managed Care – PPO

## 2023-09-21 DIAGNOSIS — I5042 Chronic combined systolic (congestive) and diastolic (congestive) heart failure: Secondary | ICD-10-CM | POA: Diagnosis not present

## 2023-09-22 NOTE — Progress Notes (Signed)
 Office Visit Note  Patient: Jack [name]             Date of Birth: 1965/12/27           MRN: [id number]             PCP: Rilla Baller, MD Referring: Rilla Baller, MD Visit Date: 10/06/2023   Subjective:  Follow-up   Discussed the use of AI scribe software for clinical note transcription with the patient, who gave verbal consent to proceed.  History of Present Illness   Jack [name] is a 58 y.o. male here for follow up with eosinophilic pneumonia and Crohn's disease currently on Entyvio  infusions monthly, azathioprine  150 mg daily, and prednisone  15 mg daily.    He is currently on a regimen of prednisone , tapered down to 15 mg daily since last visit. He has not experienced any new flare-ups or episodes since the last visit. Previously, attempts to reduce prednisone  below 14 mg resulted in lung-related flare-ups.  He is also taking alendronate  (Fosamax ) weekly as part of his treatment regimen.  No recent illnesses, breathing difficulties, swelling, or gastrointestinal problems. No issues with medication adherence or access.  He continues to have easy bruising which is chronic currently most affected on the right leg.   Previous HPI 06/01/2023 Jack [name] is a 58 year old male with eosinophilic pneumonia and Crohn's disease currently on Entyvio  infusions monthly, azathioprine  150 mg daily, and prednisone  16 mg daily.  He is on pantoprazole  40 mg daily and alendronate  70 mg weekly prophylaxis.   No recent flare-ups or respiratory symptoms have been noted, and he has not been sick recently. Attempts to taper his prednisone  dose multiple times have been unsuccessful, as symptoms return when the dose is reduced below 16 mg. Various tapering methods, including reducing by half a milligram and alternating doses, have been tried without success.   He has a history of eosinophilic pneumonia, and previous lab tests, including C ANCA, have been negative. He has not  had any recent bone density scans, although he is on Fosamax , which was previously prescribed by Dr. Leni. He does not recall the last time a bone density scan was performed.  Per chart review most recent result I can see was from 2017 that did indicate osteoporosis in bilateral hips.   During the review of symptoms, he reports easy bruising and skin fragility, which he attributes to his current medication regimen. No recent upper respiratory infections, leg swelling, or vision changes. He has not experienced any major bruising beyond what he considers normal for him.   DEXA Results Reviewed Lumbar spine L2-L4 11/2015 -2.3 01/2011 -2.7   Left hip femoral neck 11/2015 -2.8 01/2011 -2.8   Right hip femoral neck 11/2015 -2.9 01/2011 -2.8 2017 FRAX risk Major osteoporotic fracture 10.1% Hip fracture 3.8%     Previous HPI 03/03/23 Jack [name] is a 58 y.o. 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  Currently transferring care from [facility] to consolidate in [facility] health system offices.  He also has history of Crohn's disease for which he sees Dr. Albertus and has been on treatment with monthly Entyvio  infusion and on azathioprine  150 mg daily. He has been on prednisone  for over ten years, which he has been unable to reduce due to the recurrence of respiratory symptoms. These symptoms present similarly to pneumonia, with the primary manifestation being in  the lungs. The patient has been admitted to the hospital multiple times due to these symptoms, with imaging often showing nodules or inflammation in the lungs.   Lung process is thought to be eosinophilic pneumonia associated with high peripheral eosinophil counts and elevated IgE.  There has previously been concern for vasculitis but none definitively confirmed on imaging or biopsy pathology result.  The exact diagnosis remains  unclear. Eosinophilic Granulomatosis with Polyangiitis (EGPA) has been suspected, but this has not been definitively confirmed.  He has been on continuous treatment with prednisone  for more than a decade.  He had a few other medication changes with a trial of switching mercaptopurine  to azathioprine  though he is not sure how much this ever controlled lung disease.   Chronic eosinophil mediated process dates back more than a decade workup in 2010 showing high serum IgE.at the time was more associated with pruritus rather than lung involvement.  Underwent bone marrow biopsy in 2012 indicating overproduction of eosinophils.  2014 labs with positive ANCA and rheumatoid factor.  Ongoing treatment with prednisone  was continued concern about eosinophilic pneumonia versus steroid responsive Crohn's associated pneumonitis.  He has had positive ANCA serology testing in 2018 but subsequently negative follow-up tests.  He had sinonasal biopsy inconsistent with vasculitis in 2020 and procedure findings more just consistent with allergies.   IBD well controlled based on recent colonoscopy on 01/12/23. The patient is maintained on Entyvio  infusions every four weeks for his Crohn's disease. He previously had a reaction to Remicade infusion. The patient has not had a flare-up of his Crohn's disease in over a year. Previously had some breakthrough disease when infusions frequency was less than monthly.   The patient also mentioned an upcoming imaging study of his kidneys due to cysts on previous imaging. However, he has not experienced any kidney-related symptoms such as stones. Recent metabolic panel and urine protein tests were normal.   He is on alendronate  weekly for prevention of glucocorticoid induced osteoporosis.   Review of Systems  Constitutional:  Negative for fatigue.  HENT:  Negative for mouth sores and mouth dryness.   Eyes:  Negative for dryness.  Respiratory:  Negative for shortness of breath.    Cardiovascular:  Negative for chest pain and palpitations.  Gastrointestinal:  Negative for blood in stool, constipation and diarrhea.  Endocrine: Negative for increased urination.  Genitourinary:  Negative for involuntary urination.  Musculoskeletal:  Negative for joint pain, gait problem, joint pain, joint swelling, myalgias, muscle weakness, morning stiffness, muscle tenderness and myalgias.  Skin:  Negative for color change, rash, hair loss and sensitivity to sunlight.  Allergic/Immunologic: Negative for susceptible to infections.  Neurological:  Negative for dizziness and headaches.  Hematological:  Negative for swollen glands.  Psychiatric/Behavioral:  Negative for depressed mood and sleep disturbance. The patient is not nervous/anxious.     PMFS History:  Patient Active Problem List   Diagnosis Date Noted   Benign neoplasm of cecum 01/12/2023   Benign neoplasm of descending colon 01/12/2023   Benign neoplasm of sigmoid colon 01/12/2023   Acute headache 10/27/2022   On prednisone  therapy 06/09/2022   Rectal bleeding 02/12/2022   Erectile dysfunction 01/27/2022   Elevated Lp(a) 08/20/2021   S/P angioplasty with stent to RCA 08/06/21  08/07/2021   CAD (coronary artery disease) 08/06/2021   Angina pectoris without myocardial infarction (HCC) 08/06/2021   Insomnia 06/04/2020   HFrEF (heart failure with reduced ejection fraction) (HCC) 12/20/2019   Aortic dilatation (HCC) 05/29/2019   Prostate cancer (  HCC) 03/02/2019   Acquired renal cyst of right kidney 08/31/2018   OSA (obstructive sleep apnea) 05/27/2018   Spinal cord tumor 05/12/2018   Leukocytosis 03/23/2018   Chronic leg pain 03/23/2018   Status post insertion of drug-eluting stent into left anterior descending (LAD) artery for coronary artery disease 03/13/2018   GIB (gastrointestinal bleeding) 03/06/2018   Post-MI pericarditis (HCC) 02/24/2018   Hyperlipidemia LDL goal <70 02/24/2018   Churg-Strauss syndrome with lung  involvement (HCC) 01/08/2017   Defecation urgency 08/04/2016   Healthcare maintenance 03/18/2016   Eosinophilic esophagitis    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 anterior STEMI 01/2018 -  proximal occlusion of LAD, treated with DES. Cath also showed 20% distal LM, 95% ostial-prox small-moderate ramus, 70-80% ostial Cx, 70% dominant ostial OM, 50-60% prox Cx, 70% RCA. EF 35% by cath with LVEDP . Med rx for residual disease. Course complicated by post MI pericarditis and LV thrombus.   Chronic systolic (congestive) heart failure (HCC)    Chronic systolic HF (heart failure) (HCC) 08/07/2021   Colitis    Colon polyp    inflammatory   Crohn disease (HCC) 1992   history uveitis, involvement of intestines and lungs   Diverticulosis    Eosinophilic granuloma (HCC)    Essential hypertension    GERD (gastroesophageal reflux disease)    GI bleeding    History of chicken pox    History of gastroesophageal reflux (GERD)    History of seizure 1995   grand mal x1, completed 6 yrs dilantin. no seizures since   Hyperlipidemia    Internal hemorrhoids    Ischemic cardiomyopathy    LV (left ventricular) mural thrombus following MI (HCC) 01/2018   Myocardial infarction (HCC)    Osteoporosis 11/2015   DEXA T -2.9   Prostate cancer (HCC) 2021   NO TX   Schwannoma 2007   L axilla s/p surgery   Seizures (HCC)    last seizure 1995 and only x 1 seizure-    Ulnar neuropathy    h/o this from L arm schwannoma    Family History  Problem Relation Age of Onset   Heart disease Mother    CAD Mother 11       CABG    Congenital heart disease Mother    Hypertension Mother    Hyperlipidemia Mother    Heart disease Father    CAD Father 27       CABG   Hypertension Father    Hyperlipidemia Father    Crohn's disease Brother    Colon polyps Maternal Grandmother    Colon cancer Maternal Grandmother 45   Diabetes Neg Hx    Esophageal cancer Neg Hx    Rectal cancer Neg Hx    Stomach cancer Neg Hx    Past Surgical History:  Procedure Laterality Date   APPENDECTOMY  2000   BACK SURGERY  2011   lumbar   BIOPSY  01/12/2023   Procedure: BIOPSY;  Surgeon: Albertus Gordy HERO, MD;  Location: [facility];  Service: Gastroenterology;;   CARDIAC CATHETERIZATION  CARDIOVASCULAR STRESS TEST  01/2015   low risk study   CHOLECYSTECTOMY  2000   COLONOSCOPY  08/2014   f/u crohn's, 4 inflammatory polyps, diverticulosis, improved, rpt 2 yrs ([place])   COLONOSCOPY  11/2018   multiple polyps, 1 TA, rpt 2 yrs (Pyrtle)   COLONOSCOPY WITH PROPOFOL  N/A 04/09/2018   inflammatory polyp, f/u left to primary GI - Pyrtle Lavonne, Victory CROME III, MD)   COLONOSCOPY WITH PROPOFOL  N/A 01/12/2023   SSP, chronic active colitis, inflamamtory polyp, rpt 3 yrs (Pyrtle, Gordy HERO, MD)   CORONARY ANGIOGRAPHY N/A 02/21/2018   Procedure: CORONARY ANGIOGRAPHY;  Surgeon: Claudene Victory ORN, MD;  Location: [facility] INVASIVE CV LAB;  Service: Cardiovascular;  Laterality: N/A;   CORONARY STENT INTERVENTION N/A 08/06/2021   Procedure: CORONARY STENT INTERVENTION;  Surgeon: Claudene Victory ORN, MD;  Location: [facility] CV [facility];  Service: Cardiovascular;  Laterality: N/A;   CORONARY/GRAFT ACUTE MI REVASCULARIZATION N/A 02/20/2018   Procedure: Coronary/Graft Acute MI Revascularization;  Surgeon: Claudene Victory ORN, MD;  Location: [facility];  Service: Cardiovascular;  Laterality: N/A;   ESOPHAGOGASTRODUODENOSCOPY  07/2014   h/o EE resolved, focal reflux esophagitis, chronic active gastritis   ESOPHAGOGASTRODUODENOSCOPY  11/2018   gastritis, no active crohn's  (Pyrtle)   extremity surgery Left    ulnar nerve repair after schwannoma removal   FLEXIBLE SIGMOIDOSCOPY N/A 04/14/2018   Procedure: FLEXIBLE SIGMOIDOSCOPY;  Surgeon: Teressa Toribio SQUIBB, MD;  Location: [facility] ENDOSCOPY;  Service: Endoscopy;  Laterality: N/A;   HEMOSTASIS CLIP PLACEMENT  01/12/2023   Procedure: HEMOSTASIS CLIP PLACEMENT;  Surgeon: Albertus Gordy HERO, MD;  Location: [facility];  Service: Gastroenterology;;   ICD IMPLANT N/A 06/23/2022   Procedure: ICD IMPLANT;  Surgeon: Waddell Danelle ORN, MD;  Location: [facility] INVASIVE CV LAB;  Service: Cardiovascular;  Laterality: N/A;   INGUINAL HERNIA REPAIR Bilateral 2017   LAMINECTOMY N/A 08/09/2018   Procedure: Lumbar three Laminectomy, excision of intradural tumor;  Surgeon: Colon Victory, MD;  Location: [facility] OR;  Service: Neurosurgery;  Laterality: N/A;   LEFT HEART CATH AND CORONARY ANGIOGRAPHY N/A 02/20/2018   Procedure: LEFT HEART CATH AND CORONARY ANGIOGRAPHY;  Surgeon: Claudene Victory ORN, MD;  Location: [facility] CV [facility];  Service: Cardiovascular;  Laterality: N/A;   LEFT HEART CATH AND CORONARY ANGIOGRAPHY N/A 08/06/2021   Procedure: LEFT HEART CATH AND CORONARY ANGIOGRAPHY;  Surgeon: Claudene Victory ORN, MD;  Location: [facility] INV[facility] CV LAB;  Service: Cardiovascular;  Laterality: N/A;   LUMBAR SPINE SURGERY  2011   POLYPECTOMY  04/09/2018   Procedure: POLYPECTOMY;  Surgeon: Legrand Victory CROME DOUGLAS, MD;  Location: [facility] ENDOSCOPY;  Service: Gastroenterology;;   POLYPECTOMY  01/12/2023   Procedure: POLYPECTOMY;  Surgeon: Albertus Gordy HERO, MD;  Location: [facility];  Service: Gastroenterology;;   SPINAL CORD STIMULATOR IMPLANT  2012   x2 (Dr Earlie)   SUBMUCOSAL INJECTION  04/09/2018   Procedure: SUBMUCOSAL INJECTION;  Surgeon: Legrand Victory CROME DOUGLAS, MD;  Location: [facility] ENDOSCOPY;  Service: Gastroenterology;;   TONSILLECTOMY  1996   TUMOR REMOVAL Left 2007   schwannoma from L armpit   UPPER GASTROINTESTINAL ENDOSCOPY     Social History   Social History Narrative    Lives with wife, 1 dog. Grown children   Edu: college   Occ: owns mattress store   Activity: active at work, started [employer], wants to restart running   Diet: good water, fruits/vegetables daily   Immunization History  Administered Date(s) Administered   Hepb-cpg 10/19/2018, 11/23/2018   Influenza  Inj Mdck Quad Pf 01/01/2017   Influenza, Seasonal, Injecte, Preservative Fre 02/11/2023   Influenza,inj,Quad PF,6+ Mos 12/22/2014, 01/04/2018, 03/02/2019, 06/04/2020, 01/27/2022   Influenza-Unspecified 11/30/2015, 01/01/2017   PFIZER(Purple Top)SARS-COV-2 Vaccination 06/21/2019, 07/19/2019, 03/22/2020   PPD Test 06/08/2012   Pneumococcal Conjugate-13 01/04/2018   Pneumococcal Polysaccharide-23 03/18/2016, 08/10/2018   Tdap 10/24/2021   Zoster Recombinant(Shingrix) 07/25/2020, 10/25/2020     Objective: Vital Signs: BP 133/88 (BP Location: Right Arm, Patient Position: Sitting, Cuff Size: Normal)   Pulse 77   Resp 14   Ht 5' 7 (1.702 m)   Wt 165 lb (74.8 kg)   BMI 25.84 kg/m    Physical Exam Eyes:     Conjunctiva/sclera: Conjunctivae normal.  Cardiovascular:     Rate and Rhythm: Normal rate and regular rhythm.  Pulmonary:     Effort: Pulmonary effort is normal.     Breath sounds: Normal breath sounds.  Musculoskeletal:     Right lower leg: No edema.     Left lower leg: No edema.  Lymphadenopathy:     Cervical: No cervical adenopathy.  Skin:    General: Skin is warm and dry.     Findings: Bruising present.     Comments: Anterior and lateral bruising on the right lower leg  Neurological:     Mental Status: He is alert.  Psychiatric:        Mood and Affect: Mood normal.      Musculoskeletal Exam:  Shoulders full ROM no tenderness or swelling Elbows full ROM no tenderness or swelling Wrists full ROM no tenderness or swelling Fingers full ROM no tenderness or swelling Knees full ROM no tenderness or swelling Ankles full ROM no tenderness or  swelling   Investigation: No additional findings.  Imaging: CUP PACEART REMOTE DEVICE CHECK Result Date: 09/24/2023 ICD: Scheduled remote reviewed. Normal device function.  Presenting rhythm: VS Next remote 91 days. ML, CVRS   Recent Labs: Lab Results  Component Value Date   WBC 8.6 06/01/2023   HGB 14.1 06/01/2023   PLT 261 06/01/2023   NA 142 06/01/2023   K 4.1 06/01/2023   CL 106 06/01/2023   CO2 28 06/01/2023   GLUCOSE 99 06/01/2023   BUN 25 06/01/2023   CREATININE 1.00 06/01/2023   BILITOT 0.9 06/01/2023   ALKPHOS 44 02/04/2023   AST 23 06/01/2023   ALT 28 06/01/2023   PROT 6.2 06/01/2023   ALBUMIN 3.8 02/04/2023   CALCIUM  8.6 06/01/2023   GFRAA 88 01/27/2020   QFTBGOLDPLUS NEGATIVE 06/01/2023    Speciality Comments: No specialty comments available.  Procedures:  No procedures performed Allergies: Cymbalta [duloxetine hcl], Infliximab, and Keflex [cephalexin]   Assessment / Plan:     Visit Diagnoses: Churg-Strauss syndrome with lung involvement (HCC) - Plan: Sedimentation rate, C-reactive protein EGPA controlled with prednisone ; dose reduction previously caused lung flare-ups.  No particular signs of extrapulmonary involvement has had no new episodes of cough or dyspnea.  We discussed risk benefit of continuing the current regimen versus trying to further taper prednisone  that may reduce his long-term complications with regards to bone density infection risk of cardiovascular disease but could have risk of flareups.  I think a very good long-term goal be to get down to 10 mg prednisone , lower than that will be a challenging prospect as he would almost certainly have secondary adrenal insufficiency. - Rechecking sed rate and CRP for inflammatory disease activity monitoring - If normal, reduce prednisone  to 14 mg daily.  On prednisone  therapy - Attempt  to decrease Prednisone  to 15mg  daily pending normal lab results  High risk medication use - Fosamax  70 mg p.o.  weekly - Plan: CBC with Differential/Platelet, Comprehensive metabolic panel with GFR Has been tolerating prednisone .  No specific side effects with the steroid besides the easy bruising that is ongoing.  Remains on Fosamax  70 mg weekly for associated osteoporosis. - Checking CBC and CMP for medication monitoring on continued treatment with multiple medications including prednisone  azathioprine  and Fosamax   Crohn's disease of both small and large intestine without complication (HCC) - Stable on Entyvio  infusions monthly and the Imuran  150 mg daily. - Plan: Sedimentation rate, C-reactive protein - He is remaining on the Entyvio  and azathioprine  management per GI, well-controlled     Orders: Orders Placed This Encounter  Procedures   Sedimentation rate   C-reactive protein   CBC with Differential/Platelet   Comprehensive metabolic panel with GFR   Meds ordered this encounter  Medications   predniSONE  (DELTASONE ) 1 MG tablet    Sig: Take 4 tablets by mouth daily in combination for total daily dose 14mg     Dispense:  360 tablet    Refill:  0   predniSONE  (DELTASONE ) 10 MG tablet    Sig: Take 1 tablet (10 mg total) by mouth daily with breakfast. In combination with 1 mg tablets for total 14 mg daily     Follow-Up Instructions: Return in about 3 months (around 01/06/2024) for EGPA on VEDO/AZA/GC f/u 3mos.   Lonni LELON Ester, MD  Note - This record has been created using [employer].  Chart creation errors have been sought, but may not always  have been located. Such creation errors do not reflect on  the standard of medical care.

## 2023-09-24 LAB — CUP PACEART REMOTE DEVICE CHECK
Battery Remaining Longevity: 156 mo
Battery Voltage: 3.02 V
Brady Statistic RA Percent Paced: INVALID
Brady Statistic RV Percent Paced: 0.01 %
Date Time Interrogation Session: 20250625105751
HighPow Impedance: 62 Ohm
Implantable Lead Connection Status: 753985
Implantable Lead Implant Date: 20240325
Implantable Lead Location: 753860
Implantable Lead Model: 6935
Implantable Pulse Generator Implant Date: 20240325
Lead Channel Impedance Value: 494 Ohm
Lead Channel Impedance Value: 589 Ohm
Lead Channel Pacing Threshold Amplitude: 0.625 V
Lead Channel Pacing Threshold Pulse Width: 0.4 ms
Lead Channel Sensing Intrinsic Amplitude: 25 mV
Lead Channel Setting Pacing Amplitude: 2 V
Lead Channel Setting Pacing Pulse Width: 0.4 ms
Lead Channel Setting Sensing Sensitivity: 0.3 mV
Zone Setting Status: 755011

## 2023-09-27 ENCOUNTER — Ambulatory Visit: Payer: Self-pay | Admitting: Internal Medicine

## 2023-09-30 ENCOUNTER — Ambulatory Visit: Attending: Cardiology | Admitting: Emergency Medicine

## 2023-09-30 DIAGNOSIS — Z01810 Encounter for preprocedural cardiovascular examination: Secondary | ICD-10-CM

## 2023-09-30 NOTE — Progress Notes (Signed)
 Virtual Visit via Telephone Note   Because of [name] co-morbid illnesses, he is at least at moderate risk for complications without adequate follow up.  This format is felt to be most appropriate for this patient at this time.  Due to technical limitations with video connection (technology), today's appointment will be conducted as an audio only telehealth visit, and [name] verbally agreed to proceed in this manner.   All issues noted in this document were discussed and addressed.  No physical exam could be performed with this format.  Evaluation Performed:  Preoperative cardiovascular risk assessment _____________   Date:  09/30/2023   Patient ID:  Jack [name], DOB Oct 30, 1965, MRN [id number] Patient Location:  Home Provider location:   Office  Primary Care Provider:  Rilla Baller, MD Primary Cardiologist:  Candyce Reek, MD  Chief Complaint / Patient Profile   58 y.o. y/o male with a h/o CHF, CAD, LV aneurysm, hyperlipidemia, hypertension, Crohn's disease, Churg-Strauss vasculitis, ICD, adrenal insufficiency who is pending prostate ultrasound biopsy on 10/16/2023 with [facility] by Dr. Carolee and presents today for telephonic preoperative cardiovascular risk assessment.  History of Present Illness    Jack [name] is a 58 y.o. male who presents via audio/video conferencing for a telehealth visit today.  Pt was last seen in cardiology clinic on 06/24/2023 by Dr. Francyne.  At that time TALLEN SCHNORR was doing well.  The patient is now pending procedure as outlined above. Since his last visit, he  denies chest pain, shortness of breath, lower extremity edema, fatigue, palpitations, melena, hematuria, hemoptysis, diaphoresis, weakness, presyncope, syncope, orthopnea, and PND.  Today he tells me he is doing well overall.  He denies any acute cardiovascular concerns or complaint at this time.  Denies any anginal symptoms.  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  He gets at least 10,000 steps a day and moves heavy mattresses almost daily without limitation or exertional symptoms.  He is easily able to complete greater than 4 METS.  Past Medical History    Past Medical History:  Diagnosis Date   Acquired renal cyst of right kidney 08/31/2018   2.8cm R upper pole rec monitor with yearly imaging (09/2018)   Allergy    BPH (benign prostatic hypertrophy)    CAD (coronary artery disease)    a. anterior STEMI 01/2018 -  proximal occlusion of LAD, treated with DES. Cath also showed 20% distal LM, 95% ostial-prox small-moderate ramus, 70-80% ostial Cx, 70% dominant ostial OM, 50-60% prox Cx, 70% RCA. EF 35% by cath with LVEDP . Med rx for residual disease. Course complicated by post MI pericarditis and LV thrombus.   Chronic systolic (congestive) heart failure (HCC)    Chronic systolic HF (heart failure) (HCC) 08/07/2021   Colitis    Colon polyp    inflammatory   Crohn disease (HCC) 1992   history uveitis, involvement of intestines and lungs   Diverticulosis    Eosinophilic granuloma (HCC)    Essential hypertension    GERD (gastroesophageal reflux disease)    GI bleeding    History of chicken pox    History of gastroesophageal reflux (GERD)    History of seizure 1995   grand mal x1, completed 6 yrs dilantin. no seizures since   Hyperlipidemia    Internal hemorrhoids    Ischemic cardiomyopathy    LV (left ventricular) mural thrombus following MI (HCC) 01/2018   Myocardial infarction [facility])    Osteoporosis  11/2015   DEXA T -2.9   Prostate cancer (HCC) 2021   NO TX   Schwannoma 2007   L axilla s/p surgery   Seizures (HCC)    last seizure 1995 and only x 1 seizure-    Ulnar neuropathy    h/o this from L arm schwannoma   Past Surgical History:  Procedure Laterality Date   APPENDECTOMY  2000   BACK SURGERY  2011   lumbar   BIOPSY  01/12/2023   Procedure: BIOPSY;  Surgeon: Albertus Gordy HERO, MD;  Location: [facility]  ENDOSCOPY;  Service: Gastroenterology;;   CARDIAC CATHETERIZATION     CARDIOVASCULAR STRESS TEST  01/2015   low risk study   CHOLECYSTECTOMY  2000   COLONOSCOPY  08/2014   f/u crohn's, 4 inflammatory polyps, diverticulosis, improved, rpt 2 yrs ([place])   COLONOSCOPY  11/2018   multiple polyps, 1 TA, rpt 2 yrs (Pyrtle)   COLONOSCOPY WITH PROPOFOL  N/A 04/09/2018   inflammatory polyp, f/u left to primary GI - Pyrtle Lavonne, Victory CROME III, MD)   COLONOSCOPY WITH PROPOFOL  N/A 01/12/2023   SSP, chronic active colitis, inflamamtory polyp, rpt 3 yrs (Pyrtle, Gordy HERO, MD)   CORONARY ANGIOGRAPHY N/A 02/21/2018   Procedure: CORONARY ANGIOGRAPHY;  Surgeon: Claudene Victory ORN, MD;  Location: [facility] INVASIVE CV LAB;  Service: Cardiovascular;  Laterality: N/A;   CORONARY STENT INTERVENTION N/A 08/06/2021   Procedure: CORONARY STENT INTERVENTION;  Surgeon: Claudene Victory ORN, MD;  Location: [facility] INVASIVE CV LAB;  Service: Cardiovascular;  Laterality: N/A;   CORONARY/GRAFT ACUTE MI REVASCULARIZATION N/A 02/20/2018   Procedure: Coronary/Graft Acute MI Revascularization;  Surgeon: Claudene Victory ORN, MD;  Location: [facility] INVASIVE CV LAB;  Service: Cardiovascular;  Laterality: N/A;   ESOPHAGOGASTRODUODENOSCOPY  07/2014   h/o EE resolved, focal reflux esophagitis, chronic active gastritis   ESOPHAGOGASTRODUODENOSCOPY  11/2018   gastritis, no active crohn's (Pyrtle)   extremity surgery Left    ulnar nerve repair after schwannoma removal   FLEXIBLE SIGMOIDOSCOPY N/A 04/14/2018   Procedure: FLEXIBLE SIGMOIDOSCOPY;  Surgeon: Teressa Toribio SQUIBB, MD;  Location: [facility] ENDOSCOPY;  Service: Endoscopy;  Laterality: N/A;   HEMOSTASIS CLIP PLACEMENT  01/12/2023   Procedure: HEMOSTASIS CLIP PLACEMENT;  Surgeon: Albertus Gordy HERO, MD;  Location: [facility] ENDOSCOPY;  Service: Gastroenterology;;   ICD IMPLANT N/A 06/23/2022   Procedure: ICD IMPLANT;  Surgeon: Waddell Danelle ORN, MD;  Location[facility] INVASIVE CV LAB;  Service: Cardiovascular;  Laterality: N/A;    INGUINAL HERNIA REPAIR Bilateral 2017   LAMINECTOMY N/A 08/09/2018   Procedure: Lumbar three Laminectomy, excision of intradural tumor;  Surgeon: Colon Victory, MD;  Location[facility] OR;  Service: Neurosurgery;  Laterality: N/A;   LEFT HEART CATH AND CORONARY ANGIOGRAPHY N/A 02/20/2018   Procedure: LEFT HEART CATH AND CORONARY ANGIOGRAPHY;  Surgeon: Claudene Victory ORN, MD;  Location: [facility] INVASIVE CV LAB;  Service: Cardiovascular;  Laterality: N/A;   LEFT HEART CATH AND CORONARY ANGIOGRAPHY N/A 08/06/2021   Procedure: LEFT HEART CATH AND CORONARY ANGIOGRAPHY;  Surgeon: Claudene Victory ORN, MD;  Location: [facility] INVASIVE CV LAB;  Service: Cardiovascular;  Laterality: N/A;   LUMBAR SPINE SURGERY  2011   POLYPECTOMY  04/09/2018   Procedure: POLYPECTOMY;  Surgeon: Legrand Victory CROME DOUGLAS, MD;  Location: [facility] ENDOSCOPY;  Service: Gastroenterology;;   POLYPECTOMY  01/12/2023   Procedure: POLYPECTOMY;  Surgeon: Albertus Gordy HERO, MD;  Location: [facility] ENDOSCOPY;  Service: Gastroenterology;;   SPINAL CORD STIMULATOR IMPLANT  2012   x2 (Dr Earlie)   SUBMUCOSAL INJECTION  04/09/2018   Procedure: SUBMUCOSAL INJECTION;  Surgeon: Legrand Victory LITTIE DOUGLAS, MD;  Location: [facility] ENDOSCOPY;  Service: Gastroenterology;;   TONSILLECTOMY  1996   TUMOR REMOVAL Left 2007   schwannoma from L armpit   UPPER GASTROINTESTINAL ENDOSCOPY      Allergies  Allergies  Allergen Reactions   Cymbalta [Duloxetine Hcl] Other (See Comments)    Headaches   Infliximab    Keflex [Cephalexin] Nausea And Vomiting    GI upset    Home Medications    Prior to Admission medications   Medication Sig Start Date End Date Taking? Authorizing Provider  acetaminophen  (TYLENOL ) 325 MG tablet Take 2 tablets (650 mg total) by mouth every 4 (four) hours as needed for headache or mild pain. Patient not taking: Reported on 06/24/2023 08/07/21   Marylu Leita SAUNDERS, NP  alendronate  (FOSAMAX ) 70 MG tablet TAKE 1 TABLET (70 MG TOTAL) BY MOUTH EVERY 7 (SEVEN) DAYS. ON SATURDAY 08/20/23    Rice, Lonni ORN, MD  amitriptyline  (ELAVIL ) 25 MG tablet Take 1 tablet (25 mg total) by mouth at bedtime as needed for sleep. 08/07/23   Rilla Baller, MD  aspirin  EC 81 MG tablet Take 81 mg by mouth daily.    [provider]  atorvastatin  (LIPITOR ) 80 MG tablet Take 1 tablet (80 mg total) by mouth every evening at 6pm. 07/08/23   Dann Candyce RAMAN, MD  azaTHIOprine  (IMURAN ) 50 MG tablet Take 3 tablets (150 mg total) by mouth at bedtime.CALL TO SCHEDULE AN APPT. 09/10/23   Pyrtle, Gordy HERO, MD  Calcium  Carb-Cholecalciferol  (CALCIUM  + VITAMIN D3) 600-5 MG-MCG TABS Take 1 tablet by mouth daily. 02/11/23   Rilla Baller, MD  carvedilol  (COREG ) 3.125 MG tablet TAKE 1 TABLET BY MOUTH 2 TIMES DAILY. 08/10/23   Croitoru, Mihai, MD  clopidogrel  (PLAVIX ) 75 MG tablet Take 1 tablet (75 mg total) by mouth daily. 08/04/23   Croitoru, Mihai, MD  dapagliflozin  propanediol (FARXIGA ) 10 MG TABS tablet Take 1 tablet (10 mg total) by mouth daily before breakfast. 08/07/23   Croitoru, Mihai, MD  diphenoxylate -atropine  (LOMOTIL ) 2.5-0.025 MG tablet Take 1 tablet by mouth 4 (four) times daily as needed for diarrhea or loose stools. Patient not taking: Reported on 06/24/2023    [provider]  ezetimibe  (ZETIA ) 10 MG tablet Take 1 tablet (10 mg total) by mouth daily. 10/28/22   Waddell Danelle ORN, MD  finasteride  (PROSCAR ) 5 MG tablet Take 1 tablet (5 mg total) by mouth daily. 07/08/23     finasteride  (PROSCAR ) 5 MG tablet Take 1 tablet (5 mg total) by mouth daily. 07/10/23     finasteride  (PROSCAR ) 5 MG tablet Take 1 tablet (5 mg total) by mouth daily. 09/01/23     icosapent  Ethyl (VASCEPA ) 1 g capsule Take 2 capsules (2 g total) by mouth 2 (two) times daily. 07/15/23   Croitoru, Mihai, MD  levofloxacin  (LEVAQUIN ) 750 MG tablet Take 1 tablet (750 mg total) by mouth on morning of procedure. 09/01/23     nitroGLYCERIN  (NITROSTAT ) 0.4 MG SL tablet Place 1 tablet (0.4 mg total) under the tongue every 5 (five)  minutes as needed for chest pain. Patient not taking: Reported on 06/24/2023 08/07/21   Marylu Leita SAUNDERS, NP  OVER THE COUNTER MEDICATION Apply 1 Application topically daily. Athlete's foot cream    [provider]  pantoprazole  (PROTONIX ) 40 MG tablet Take 1 tablet (40 mg total) by mouth daily. 05/11/23   Rilla Baller, MD  predniSONE  (DELTASONE ) 1 MG tablet Take  1 tablet (1 mg total) by mouth daily. 03/03/23   Jeannetta Lonni ORN, MD  predniSONE  (DELTASONE ) 10 MG tablet Take 1 tablet (10 mg total) by mouth daily with breakfast. 03/03/23   Rice, Lonni ORN, MD  predniSONE  (DELTASONE ) 5 MG tablet Take 1 tablet (5 mg total) by mouth daily. 03/03/23   Jeannetta Lonni ORN, MD  sacubitril -valsartan  (ENTRESTO ) 24-26 MG Take 1 tablet by mouth 2 (two) times daily. 08/10/23   Croitoru, Mihai, MD  silodosin  (RAPAFLO ) 8 MG CAPS capsule Take 1 capsule (8 mg total) by mouth daily. 09/01/23     silodosin  (RAPAFLO ) 8 MG CAPS capsule Take 1 capsule (8 mg total) by mouth daily. 09/01/23     silodosin  (RAPAFLO ) 8 MG CAPS capsule Take 1 capsule (8 mg total) by mouth daily. 09/01/23     spironolactone  (ALDACTONE ) 25 MG tablet Take 0.5 tablets (12.5 mg total) by mouth every Monday, Wednesday, and Friday. 07/24/23   Croitoru, Mihai, MD  tadalafil  (CIALIS ) 20 MG tablet Take 1 tablet (20 mg total) by mouth daily as needed for erectile dysfunction. Patient not taking: Reported on 06/24/2023 01/27/22   Rilla Baller, MD  tadalafil  (CIALIS ) 5 MG tablet Take 1 tablet (5 mg total) by mouth daily. 09/01/23     vedolizumab  (ENTYVIO ) 300 MG injection Inject 300 mg into the vein every 28 (twenty-eight) days.    [provider]  verapamil  (CALAN -SR) 120 MG CR tablet Take 1 tablet (120 mg total) by mouth 2 (two) times daily for 2 days. As needed for cluster headaches Patient not taking: Reported on 06/24/2023 11/17/22 11/19/22  Rilla Baller, MD    Physical Exam    Vital Signs:  Odean Fester Kliethermes does not have  vital signs available for review today.  Given telephonic nature of communication, physical exam is limited. AAOx3. NAD. Normal affect.  Speech and respirations are unlabored.  Accessory Clinical Findings    None  Assessment & Plan    1.  Preoperative Cardiovascular Risk Assessment: According to the Revised Cardiac Risk Index (RCRI), his Perioperative Risk of Major Cardiac Event is (%): 6.6. His Functional Capacity in METs is: 6.7 according to the Duke Activity Status Index (DASI). Therefore, based on ACC/AHA guidelines, patient would be at acceptable risk for the planned procedure without further cardiovascular testing. I will route this recommendation to the requesting party via Epic fax function.  The patient was advised that if he develops new symptoms prior to surgery to contact our office to arrange for a follow-up visit, and he verbalized understanding.  He may hold Plavix  for 5 days prior to procedure. Please resume Plavix  as soon as possible postprocedure, at the discretion of the surgeon.  He should continue aspirin  throughout the perioperative period.  A copy of this note will be routed to requesting surgeon.  Time:   Today, I have spent 7 minutes with the patient with telehealth technology discussing medical history, symptoms, and management plan.     Lum LITTIE Louis, NP  09/30/2023, 9:12 AM

## 2023-10-06 ENCOUNTER — Ambulatory Visit: Attending: Internal Medicine | Admitting: Internal Medicine

## 2023-10-06 ENCOUNTER — Other Ambulatory Visit (HOSPITAL_COMMUNITY): Payer: Self-pay

## 2023-10-06 ENCOUNTER — Encounter: Payer: Self-pay | Admitting: Internal Medicine

## 2023-10-06 ENCOUNTER — Other Ambulatory Visit: Payer: Self-pay | Admitting: Internal Medicine

## 2023-10-06 VITALS — BP 133/88 | HR 77 | Resp 14 | Ht 67.0 in | Wt 165.0 lb

## 2023-10-06 DIAGNOSIS — D72119 Hypereosinophilic syndrome (hes), unspecified: Secondary | ICD-10-CM

## 2023-10-06 DIAGNOSIS — Z7952 Long term (current) use of systemic steroids: Secondary | ICD-10-CM

## 2023-10-06 DIAGNOSIS — Z79899 Other long term (current) drug therapy: Secondary | ICD-10-CM

## 2023-10-06 DIAGNOSIS — M301 Polyarteritis with lung involvement [Churg-Strauss]: Secondary | ICD-10-CM | POA: Diagnosis not present

## 2023-10-06 DIAGNOSIS — K5080 Crohn's disease of both small and large intestine without complications: Secondary | ICD-10-CM

## 2023-10-06 DIAGNOSIS — D7218 Eosinophilia in diseases classified elsewhere: Secondary | ICD-10-CM

## 2023-10-06 MED ORDER — PREDNISONE 1 MG PO TABS
ORAL_TABLET | ORAL | 0 refills | Status: DC
  Filled 2023-10-06: qty 360, 90d supply, fill #0

## 2023-10-06 MED ORDER — PREDNISONE 10 MG PO TABS: 10.0000 mg | ORAL_TABLET | Freq: Every day | ORAL | Status: DC

## 2023-10-07 ENCOUNTER — Ambulatory Visit

## 2023-10-07 VITALS — BP 96/63 | HR 81 | Temp 97.8°F | Resp 16 | Ht 67.0 in | Wt 160.6 lb

## 2023-10-07 DIAGNOSIS — K5080 Crohn's disease of both small and large intestine without complications: Secondary | ICD-10-CM

## 2023-10-07 LAB — COMPREHENSIVE METABOLIC PANEL WITH GFR
AG Ratio: 1.8 (calc) (ref 1.0–2.5)
ALT: 33 U/L (ref 9–46)
AST: 21 U/L (ref 10–35)
Albumin: 4 g/dL (ref 3.6–5.1)
Alkaline phosphatase (APISO): 42 U/L (ref 35–144)
BUN: 21 mg/dL (ref 7–25)
CO2: 30 mmol/L (ref 20–32)
Calcium: 8.9 mg/dL (ref 8.6–10.3)
Chloride: 102 mmol/L (ref 98–110)
Creat: 1.05 mg/dL (ref 0.70–1.30)
Globulin: 2.2 g/dL (ref 1.9–3.7)
Glucose, Bld: 90 mg/dL (ref 65–99)
Potassium: 4.5 mmol/L (ref 3.5–5.3)
Sodium: 139 mmol/L (ref 135–146)
Total Bilirubin: 0.9 mg/dL (ref 0.2–1.2)
Total Protein: 6.2 g/dL (ref 6.1–8.1)
eGFR: 83 mL/min/1.73m2 (ref >=60)

## 2023-10-07 LAB — SEDIMENTATION RATE: Sed Rate: 9 mm/h (ref 0–20)

## 2023-10-07 LAB — CBC WITH DIFFERENTIAL/PLATELET
Absolute Lymphocytes: 1056 {cells}/uL (ref 850–3900)
Absolute Monocytes: 1512 {cells}/uL — ABNORMAL HIGH (ref 200–950)
Basophils Absolute: 84 {cells}/uL (ref 0–200)
Basophils Relative: 1.1 %
Eosinophils Absolute: 342 {cells}/uL (ref 15–500)
Eosinophils Relative: 4.5 %
HCT: 44.9 % (ref 38.5–50.0)
Hemoglobin: 14.6 g/dL (ref 13.2–17.1)
MCH: 31.7 pg (ref 27.0–33.0)
MCHC: 32.5 g/dL (ref 32.0–36.0)
MCV: 97.4 fL (ref 80.0–100.0)
MPV: 10 fL (ref 7.5–12.5)
Monocytes Relative: 19.9 %
Neutro Abs: 4606 {cells}/uL (ref 1500–7800)
Neutrophils Relative %: 60.6 %
Platelets: 250 Thousand/uL (ref 140–400)
RBC: 4.61 Million/uL (ref 4.20–5.80)
RDW: 14.4 % (ref 11.0–15.0)
Total Lymphocyte: 13.9 %
WBC: 7.6 Thousand/uL (ref 3.8–10.8)

## 2023-10-07 LAB — C-REACTIVE PROTEIN: CRP: 9 mg/L — ABNORMAL HIGH (ref <8.0)

## 2023-10-07 MED ORDER — VEDOLIZUMAB 300 MG IV SOLR
300.0000 mg | Freq: Once | INTRAVENOUS | Status: AC
  Administered 2023-10-07: 300 mg via INTRAVENOUS
  Filled 2023-10-07: qty 5

## 2023-10-07 NOTE — Progress Notes (Signed)
 Diagnosis: Crohn's Disease  Provider:  Mannam, Praveen MD  Procedure: IV Infusion  IV Type: Peripheral, IV Location: L Forearm  Entyvio  (Vedolizumab ), Dose: 300 mg  Infusion Start Time: 0903  Infusion Stop Time: 0935  Post Infusion IV Care: Peripheral IV Discontinued  Discharge: Condition: Stable, Destination: Home . AVS Declined  Performed by:  Rocky FORBES Sar, RN

## 2023-10-08 ENCOUNTER — Other Ambulatory Visit (HOSPITAL_COMMUNITY): Payer: Self-pay

## 2023-10-08 MED ORDER — EZETIMIBE 10 MG PO TABS
10.0000 mg | ORAL_TABLET | Freq: Every day | ORAL | 2 refills | Status: AC
  Filled 2023-10-08: qty 90, 90d supply, fill #0
  Filled 2024-01-06 – 2024-01-17 (×2): qty 90, 90d supply, fill #1
  Filled 2024-04-11: qty 90, 90d supply, fill #2

## 2023-10-12 ENCOUNTER — Encounter: Payer: Self-pay | Admitting: Internal Medicine

## 2023-10-12 ENCOUNTER — Other Ambulatory Visit (HOSPITAL_COMMUNITY): Payer: Self-pay

## 2023-10-12 MED ORDER — PREDNISONE 5 MG PO TABS
5.0000 mg | ORAL_TABLET | Freq: Every day | ORAL | 0 refills | Status: DC
  Filled 2023-10-12 – 2023-10-22 (×7): qty 90, 90d supply, fill #0

## 2023-10-12 NOTE — Addendum Note (Signed)
 Addended by: JEANNETTA LONNI ORN on: 10/12/2023 03:56 PM   Modules accepted: Orders

## 2023-10-13 ENCOUNTER — Other Ambulatory Visit: Payer: Self-pay

## 2023-10-13 ENCOUNTER — Other Ambulatory Visit (HOSPITAL_COMMUNITY): Payer: Self-pay

## 2023-10-14 ENCOUNTER — Other Ambulatory Visit (HOSPITAL_COMMUNITY): Payer: Self-pay

## 2023-10-15 ENCOUNTER — Other Ambulatory Visit (HOSPITAL_COMMUNITY): Payer: Self-pay

## 2023-10-16 ENCOUNTER — Other Ambulatory Visit (HOSPITAL_COMMUNITY): Payer: Self-pay

## 2023-10-17 ENCOUNTER — Other Ambulatory Visit (HOSPITAL_COMMUNITY): Payer: Self-pay

## 2023-10-22 ENCOUNTER — Other Ambulatory Visit: Payer: Self-pay

## 2023-10-26 NOTE — Progress Notes (Signed)
 Remote ICD transmission.

## 2023-10-26 NOTE — Addendum Note (Signed)
 Addended by: TAWNI DRILLING D on: 10/26/2023 03:04 PM   Modules accepted: Orders

## 2023-11-01 ENCOUNTER — Encounter (INDEPENDENT_AMBULATORY_CARE_PROVIDER_SITE_OTHER): Payer: Self-pay | Admitting: Interventional Cardiology

## 2023-11-02 ENCOUNTER — Other Ambulatory Visit: Payer: Self-pay

## 2023-11-02 ENCOUNTER — Other Ambulatory Visit: Payer: Self-pay | Admitting: Cardiovascular Disease

## 2023-11-03 ENCOUNTER — Other Ambulatory Visit: Payer: Self-pay

## 2023-11-04 ENCOUNTER — Other Ambulatory Visit (HOSPITAL_COMMUNITY): Payer: Self-pay

## 2023-11-04 ENCOUNTER — Ambulatory Visit

## 2023-11-04 VITALS — BP 110/69 | HR 64 | Temp 97.6°F | Resp 18 | Ht 67.0 in | Wt 164.0 lb

## 2023-11-04 DIAGNOSIS — K5080 Crohn's disease of both small and large intestine without complications: Secondary | ICD-10-CM | POA: Diagnosis not present

## 2023-11-04 MED ORDER — CLOPIDOGREL BISULFATE 75 MG PO TABS
75.0000 mg | ORAL_TABLET | Freq: Every day | ORAL | 2 refills | Status: AC
  Filled 2023-11-04: qty 90, 90d supply, fill #0
  Filled 2024-02-01: qty 90, 90d supply, fill #1
  Filled 2024-05-01: qty 90, 90d supply, fill #2

## 2023-11-04 MED ORDER — VEDOLIZUMAB 300 MG IV SOLR
300.0000 mg | Freq: Once | INTRAVENOUS | Status: AC
  Administered 2023-11-04: 300 mg via INTRAVENOUS
  Filled 2023-11-04: qty 5

## 2023-11-04 NOTE — Progress Notes (Signed)
 Diagnosis: Crohn's Disease  Provider:  Praveen Mannam MD  Procedure: IV Infusion  IV Type: Peripheral, IV Location: L Antecubital  Entyvio  (Vedolizumab ), Dose: 300 mg  Infusion Start Time: 0913  Infusion Stop Time: 0950  Post Infusion IV Care: Peripheral IV Discontinued  Discharge: Condition: Good, Destination: Home . AVS Declined  Performed by:  Journie Howson, RN

## 2023-11-11 DIAGNOSIS — N4289 Other specified disorders of prostate: Secondary | ICD-10-CM | POA: Diagnosis not present

## 2023-11-16 ENCOUNTER — Other Ambulatory Visit (HOSPITAL_COMMUNITY): Payer: Self-pay

## 2023-11-19 DIAGNOSIS — C61 Malignant neoplasm of prostate: Secondary | ICD-10-CM | POA: Diagnosis not present

## 2023-11-30 ENCOUNTER — Other Ambulatory Visit (HOSPITAL_COMMUNITY): Payer: Self-pay

## 2023-12-02 ENCOUNTER — Ambulatory Visit

## 2023-12-02 VITALS — BP 121/79 | HR 60 | Temp 97.9°F | Resp 14 | Ht 67.0 in | Wt 163.0 lb

## 2023-12-02 DIAGNOSIS — K5080 Crohn's disease of both small and large intestine without complications: Secondary | ICD-10-CM

## 2023-12-02 MED ORDER — VEDOLIZUMAB 300 MG IV SOLR
300.0000 mg | Freq: Once | INTRAVENOUS | Status: AC
  Administered 2023-12-02: 300 mg via INTRAVENOUS
  Filled 2023-12-02: qty 5

## 2023-12-02 NOTE — Progress Notes (Signed)
 Diagnosis: Crohn's Disease  Provider:  Praveen Mannam MD  Procedure: IV Infusion  IV Type: Peripheral, IV Location: L Forearm  Entyvio  (Vedolizumab ), Dose: 300 mg  Infusion Start Time: 0943  Infusion Stop Time: 1017  Post Infusion IV Care: Peripheral IV Discontinued  Discharge: Condition: Good, Destination: Home . AVS Declined  Performed by:  Harel Repetto, RN

## 2023-12-15 ENCOUNTER — Ambulatory Visit: Admitting: Internal Medicine

## 2023-12-15 ENCOUNTER — Encounter: Payer: Self-pay | Admitting: Internal Medicine

## 2023-12-15 VITALS — BP 126/60 | HR 63 | Ht 67.0 in | Wt 164.0 lb

## 2023-12-15 DIAGNOSIS — K5080 Crohn's disease of both small and large intestine without complications: Secondary | ICD-10-CM

## 2023-12-15 DIAGNOSIS — K219 Gastro-esophageal reflux disease without esophagitis: Secondary | ICD-10-CM

## 2023-12-15 DIAGNOSIS — L309 Dermatitis, unspecified: Secondary | ICD-10-CM

## 2023-12-15 DIAGNOSIS — D72119 Hypereosinophilic syndrome (hes), unspecified: Secondary | ICD-10-CM

## 2023-12-15 NOTE — Progress Notes (Signed)
 Subjective:    Patient ID: Jack [name], male    DOB: 13-Aug-1965, 58 y.o.   MRN: [id number]  HPI Jack [name] is a 58 year old male with ileoclonic Crohn's disease who presents for follow-up.  He has a history of ileoclonic Crohn's disease and underwent a colonoscopy on January 12, 2023, for disease activity assessment and surveillance. Six polyps were removed, ranging from 3 to 10 mm in size, with the largest being inflammatory. Two of the polyps were sessile serrated polyps. Right colonic biopsies showed chronic mildly active colitis without dysplasia, and left colon biopsies showed focal architectural disarray but no inflammation. No diarrhea or bleeding as long as he adheres to his management plan.  He is maintained on Entyvio  300 mg every four weeks and azathioprine  150 mg daily. The Entyvio  infusions are well-tolerated, and he takes azathioprine  daily as prescribed. He also uses a protein drink called 'Huel' to help maintain regular bowel habits, which he finds effective.  He has chronic eosinophilic pneumonia managed with prednisone , currently at a dose of 14 mg daily, which is lower than his usual dose of 16-17 mg.  He has a history of coronary artery disease with PCI and is on Plavix . He also takes pantoprazole  40 mg daily. Recent lab work from October 06, 2023, showed normal CMP, AST, ALT, Alk Phos, total bilirubin, albumin, white count, hemoglobin, MCV, platelet count, CRP, and ESR. A Quantiferon Gold test was negative on June 01, 2023.  A CT scan on March 20, 2023, for right renal lesions showed a 1.5 x 1.2 cm lesion in the superior pole of the right kidney, which has involuted substantially, consistent with a residual cyst. Additional simple and hemorrhagic or proteinaceous cortical cysts were noted, likely benign. The bowel was within normal limits on this test.  No diarrhea or bleeding. Energy levels are okay for the most part.  His wife wanted him to ask about rash on the  right lower calf.  He does not experience itching or pain with his rash.   Review of Systems As per HPI, otherwise negative  Current Medications, Allergies, Past Medical History, Past Surgical History, Family History and Social History were reviewed in [employer] record.    Objective:   Physical Exam BP 126/60   Pulse 63   Ht 5' 7 (1.702 m)   Wt 164 lb (74.4 kg)   BMI 25.69 kg/m  Gen: awake, alert, NAD HEENT: anicteric  Abd: soft, NT/ND, +BS throughout Ext: no c/c/e, mild eczematous sandpaperlike nonerythematous rash posterior right calf Neuro: nonfocal  LABS CMP: AST 21, ALT 30, Alk phos 42, Total bilirubin 0.9, Albumin 4.0 (10/06/2023) CBC: WBC 7.6, Hb 14.6, MCV 97.4, PLT 250 (10/06/2023) CRP: 9.0 (10/06/2023) ESR: 9 (10/06/2023) Quantiferon Gold: Negative (06/01/2023)  RADIOLOGY CT scan: 1.5 x 1.2 cm lesion in superior pole of right kidney involuted, consistent with residual cyst, additional simple and hemorrhagic or proteinaceous cortical cysts likely benign, bowel within normal limits (03/20/2023)  DIAGNOSTIC Colonoscopy: Disease in remission, assessment score of zero, six polyps removed (3-10 mm), 10 mm polyp inflammatory, two sessile serrated polyps, right colonic biopsies showed chronic mildly active colitis without dysplasia, left colon biopsies showed focal architectural disarray without inflammation (01/12/2023)      Assessment & Plan:   Ileocolonic Crohn's disease in remission, post-polypectomy surveillance Crohn's disease in remission with SES score = O. Six polyps removed, including sessile serrated x2  and inflammatory types. Right colonic biopsies showed chronic mildly active  colitis, left colon showed focal architectural disarray. Managed with Entyvio  and azathioprine .  - Continue Entyvio  300 mg every four weeks. - Continue azathioprine  150 mg daily. - Plan colonoscopy in October 2027, contingent on symptoms. - Schedule TB test  annually - Annual dermatology evaluation recommended in the setting of his longstanding IBD  Gastroesophageal reflux disease on PPI GERD managed with pantoprazole . No new symptoms. - Continue pantoprazole  40 mg daily.  Chronic eosinophilic pneumonia/vasculitis on immunosuppression Condition managed with prednisone  14 mg daily. Effective at current dose. - Continue prednisone  14 mg daily. - Follow with rheumatology  Coronary artery disease, post-PCI, on antiplatelet therapy Managed post-PCI with Plavix . No new symptoms.  Benign right renal cysts CT showed 1.5 x 1.2 cm lesion in right kidney, consistent with residual cyst. Additional benign cysts noted.  Eczema (exogenous dermatitis) Rash consistent with eczema, not infectious or Crohn's-related. Not itchy, no aggressive treatment needed. - Recommend CeraVe moisturizing lotion daily.  30 minutes total spent today including patient facing time, coordination of care, reviewing medical history/procedures/pertinent radiology studies, and documentation of the encounter.

## 2023-12-15 NOTE — Patient Instructions (Addendum)
 Remain on Entyvio , azathioprine  and protonix .   Please use a moisturizing lotion (CeraVe) on your rash.  _______________________________________________________  If your blood pressure at your visit was 140/90 or greater, please contact your primary care physician to follow up on this.  _______________________________________________________  If you are age 56 or older, your body mass index should be between 23-30. Your Body mass index is 25.69 kg/m. If this is out of the aforementioned range listed, please consider follow up with your Primary Care Provider.  If you are age 65 or younger, your body mass index should be between 19-25. Your Body mass index is 25.69 kg/m. If this is out of the aformentioned range listed, please consider follow up with your Primary Care Provider.   ________________________________________________________  The [facility] providers would like to encourage you to use MYCHART to communicate with providers for non-urgent requests or questions.  Due to long hold times on the telephone, sending your provider a message by [facility] may be a faster and more efficient way to get a response.  Please allow 48 business hours for a response.  Please remember that this is for non-urgent requests.  _______________________________________________________  [facility] is using a team-based approach to care.  Your team is made up of your doctor and two to three APPS. Our APPS (Nurse Practitioners and Physician Assistants) work with your physician to ensure care continuity for you. They are fully qualified to address your health concerns and develop a treatment plan. They communicate directly with your gastroenterologist to care for you. Seeing the Advanced Practice Practitioners on your physician's team can help you by facilitating care more promptly, often allowing for earlier appointments, access to diagnostic testing, procedures, and other specialty referrals.

## 2023-12-21 ENCOUNTER — Ambulatory Visit: Payer: BC Managed Care – PPO

## 2023-12-21 DIAGNOSIS — I5042 Chronic combined systolic (congestive) and diastolic (congestive) heart failure: Secondary | ICD-10-CM | POA: Diagnosis not present

## 2023-12-24 ENCOUNTER — Other Ambulatory Visit (HOSPITAL_COMMUNITY): Payer: Self-pay

## 2023-12-24 LAB — CUP PACEART REMOTE DEVICE CHECK
Battery Remaining Longevity: 153 mo
Battery Voltage: 3.01 V
Brady Statistic RV Percent Paced: 0.01 %
Date Time Interrogation Session: 20250924123645
HighPow Impedance: 64 Ohm
Implantable Lead Connection Status: 753985
Implantable Lead Implant Date: 20240325
Implantable Lead Location: 753860
Implantable Lead Model: 6935
Implantable Pulse Generator Implant Date: 20240325
Lead Channel Impedance Value: 456 Ohm
Lead Channel Impedance Value: 551 Ohm
Lead Channel Pacing Threshold Amplitude: 0.625 V
Lead Channel Pacing Threshold Pulse Width: 0.4 ms
Lead Channel Sensing Intrinsic Amplitude: 22.1 mV
Lead Channel Setting Pacing Amplitude: 2 V
Lead Channel Setting Pacing Pulse Width: 0.4 ms
Lead Channel Setting Sensing Sensitivity: 0.3 mV
Zone Setting Status: 755011

## 2023-12-24 NOTE — Progress Notes (Signed)
Remote ICD Transmission.

## 2023-12-27 ENCOUNTER — Ambulatory Visit: Payer: Self-pay | Admitting: Internal Medicine

## 2023-12-30 ENCOUNTER — Ambulatory Visit (INDEPENDENT_AMBULATORY_CARE_PROVIDER_SITE_OTHER)

## 2023-12-30 VITALS — BP 113/75 | HR 64 | Temp 97.9°F | Resp 18 | Ht 67.0 in | Wt 165.4 lb

## 2023-12-30 DIAGNOSIS — K5080 Crohn's disease of both small and large intestine without complications: Secondary | ICD-10-CM

## 2023-12-30 DIAGNOSIS — D239 Other benign neoplasm of skin, unspecified: Secondary | ICD-10-CM

## 2023-12-30 HISTORY — DX: Other benign neoplasm of skin, unspecified: D23.9

## 2023-12-30 MED ORDER — VEDOLIZUMAB 300 MG IV SOLR
300.0000 mg | Freq: Once | INTRAVENOUS | Status: AC
  Administered 2023-12-30: 300 mg via INTRAVENOUS
  Filled 2023-12-30: qty 5

## 2023-12-30 NOTE — Progress Notes (Signed)
 Diagnosis: Crohn's Disease  Provider:  Mannam, Praveen MD  Procedure: IV Infusion  IV Type: Peripheral, IV Location: L Antecubital  Entyvio  (Vedolizumab ), Dose: 300 mg  Infusion Start Time: 0939  Infusion Stop Time: 1012  Post Infusion IV Care: Peripheral IV Discontinued  Discharge: Condition: Stable, Destination: Home . AVS Declined  Performed by:  Rocky FORBES Sar, RN

## 2024-01-05 NOTE — Progress Notes (Signed)
 Office Visit Note  Patient: Jack [name]             Date of Birth: 10-28-1965           MRN: [id number]             PCP: Rilla Baller, MD Referring: Rilla Baller, MD Visit Date: 01/19/2024   Subjective:  Medical Management of Chronic Issues   Discussed the use of AI scribe software for clinical note transcription with the patient, who gave verbal consent to proceed.  History of Present Illness   Jack [name] is a 58 y.o. male here for follow up with eosinophilic pneumonia and Crohn's disease currently on Entyvio  infusions monthly, azathioprine  150 mg daily, and prednisone  at 13 mg daily.     He has been managing his eosinophilic granulomatosis with polyangiitis (EGPA) with prednisone , Imuran , and Entyvio . He recently reduced his prednisone  dose to 13 mg daily, maintaining this for approximately three weeks after being on a 14 mg dose for about two months. He feels slightly stuffy along his nose, which he associates with the early stages of getting sick. However, he does not feel unwell otherwise and notes that in the past, symptoms would have manifested sooner if related to the prednisone  taper.  He plans to maintain the 13 mg dose until after a vacation in mid-January, with a potential further reduction to 12 mg around February 1st. He wants to avoid a flare-up during his vacation. He is cautious about tapering too quickly, despite the long-term goal to reduce prednisone  to 10 mg.  He reports no issues with his other current medications, Imuran  and Entyvio . He has not experienced any viral illnesses requiring antibiotics. His recent lab work showed a stable eosinophil count at zero.    Previous HPI 10/06/2023 Jack [name] is a 58 y.o. male here for follow up with eosinophilic pneumonia and Crohn's disease currently on Entyvio  infusions monthly, azathioprine  150 mg daily, and prednisone  15 mg daily.     He is currently on a regimen of prednisone , tapered down  to 15 mg daily since last visit. He has not experienced any new flare-ups or episodes since the last visit. Previously, attempts to reduce prednisone  below 14 mg resulted in lung-related flare-ups.   He is also taking alendronate  (Fosamax ) weekly as part of his treatment regimen.   No recent illnesses, breathing difficulties, swelling, or gastrointestinal problems. No issues with medication adherence or access.  He continues to have easy bruising which is chronic currently most affected on the right leg.     Previous HPI 06/01/2023 Jack [name] is a 58 year old male with eosinophilic pneumonia and Crohn's disease currently on Entyvio  infusions monthly, azathioprine  150 mg daily, and prednisone  16 mg daily.  He is on pantoprazole  40 mg daily and alendronate  70 mg weekly prophylaxis.   No recent flare-ups or respiratory symptoms have been noted, and he has not been sick recently. Attempts to taper his prednisone  dose multiple times have been unsuccessful, as symptoms return when the dose is reduced below 16 mg. Various tapering methods, including reducing by half a milligram and alternating doses, have been tried without success.   He has a history of eosinophilic pneumonia, and previous lab tests, including C ANCA, have been negative. He has not had any recent bone density scans, although he is on Fosamax , which was previously prescribed by Dr. Leni. He does not recall the last time a bone density scan was performed.  Per  chart review most recent result I can see was from 2017 that did indicate osteoporosis in bilateral hips.   During the review of symptoms, he reports easy bruising and skin fragility, which he attributes to his current medication regimen. No recent upper respiratory infections, leg swelling, or vision changes. He has not experienced any major bruising beyond what he considers normal for him.   DEXA Results Reviewed Lumbar spine L2-L4 11/2015 -2.3 01/2011 -2.7   Left hip  femoral neck 11/2015 -2.8 01/2011 -2.8   Right hip femoral neck 11/2015 -2.9 01/2011 -2.8 2017 FRAX risk Major osteoporotic fracture 10.1% Hip fracture 3.8%     Previous HPI 03/03/23 Jack [name] is a 58 y.o. 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  Currently transferring care from [facility] to consolidate in [facility] health system offices.  He also has history of Crohn's disease for which he sees Dr. Albertus and has been on treatment with monthly Entyvio  infusion and on azathioprine  150 mg daily. He has been on prednisone  for over ten years, which he has been unable to reduce due to the recurrence of respiratory symptoms. These symptoms present similarly to pneumonia, with the primary manifestation being in the lungs. The patient has been admitted to the hospital multiple times due to these symptoms, with imaging often showing nodules or inflammation in the lungs.   Lung process is thought to be eosinophilic pneumonia associated with high peripheral eosinophil counts and elevated IgE.  There has previously been concern for vasculitis but none definitively confirmed on imaging or biopsy pathology result.  The exact diagnosis remains unclear. Eosinophilic Granulomatosis with Polyangiitis (EGPA) has been suspected, but this has not been definitively confirmed.  He has been on continuous treatment with prednisone  for more than a decade.  He had a few other medication changes with a trial of switching mercaptopurine  to azathioprine  though he is not sure how much this ever controlled lung disease.   Chronic eosinophil mediated process dates back more than a decade workup in 2010 showing high serum IgE.at the time was more associated with pruritus rather than lung involvement.  Underwent bone marrow biopsy in 2012 indicating overproduction of eosinophils.  2014 labs with positive ANCA and  rheumatoid factor.  Ongoing treatment with prednisone  was continued concern about eosinophilic pneumonia versus steroid responsive Crohn's associated pneumonitis.  He has had positive ANCA serology testing in 2018 but subsequently negative follow-up tests.  He had sinonasal biopsy inconsistent with vasculitis in 2020 and procedure findings more just consistent with allergies.   IBD well controlled based on recent colonoscopy on 01/12/23. The patient is maintained on Entyvio  infusions every four weeks for his Crohn's disease. He previously had a reaction to Remicade infusion. The patient has not had a flare-up of his Crohn's disease in over a year. Previously had some breakthrough disease when infusions frequency was less than monthly.   The patient also mentioned an upcoming imaging study of his kidneys due to cysts on previous imaging. However, he has not experienced any kidney-related symptoms such as stones. Recent metabolic panel and urine protein tests were normal.   He is on alendronate  weekly for prevention of glucocorticoid induced osteoporosis.   Review of Systems  Constitutional:  Negative for fatigue.  HENT:  Negative for mouth sores and mouth dryness.   Eyes:  Negative for dryness.  Respiratory:  Negative for shortness of breath.   Cardiovascular:  Negative for chest pain and palpitations.  Gastrointestinal:  Negative for blood in stool, constipation and diarrhea.  Endocrine: Negative for increased urination.  Genitourinary:  Negative for involuntary urination.  Musculoskeletal:  Negative for joint pain, gait problem, joint pain, joint swelling, myalgias, muscle weakness, morning stiffness, muscle tenderness and myalgias.  Skin:  Positive for rash. Negative for color change, hair loss and sensitivity to sunlight.  Allergic/Immunologic: Negative for susceptible to infections.  Neurological:  Negative for dizziness and headaches.  Hematological:  Negative for swollen glands.   Psychiatric/Behavioral:  Negative for depressed mood and sleep disturbance. The patient is not nervous/anxious.     PMFS History:  Patient Active Problem List   Diagnosis Date Noted   Benign neoplasm of cecum 01/12/2023   Benign neoplasm of descending colon 01/12/2023   Benign neoplasm of sigmoid colon 01/12/2023   Acute headache 10/27/2022   On prednisone  therapy 06/09/2022   Rectal bleeding 02/12/2022   Erectile dysfunction 01/27/2022   Elevated Lp(a) 08/20/2021   S/P angioplasty with stent to RCA 08/06/21  08/07/2021   CAD (coronary artery disease) 08/06/2021   Angina pectoris without myocardial infarction 08/06/2021   Insomnia 06/04/2020   HFrEF (heart failure with reduced ejection fraction) (HCC) 12/20/2019   Aortic dilatation 05/29/2019   Prostate cancer (HCC) 03/02/2019   Acquired renal cyst of right kidney 08/31/2018   OSA (obstructive sleep apnea) 05/27/2018   Spinal cord tumor 05/12/2018   Leukocytosis 03/23/2018   Chronic leg pain 03/23/2018   Status post insertion of drug-eluting stent into left anterior descending (LAD) artery for coronary artery disease 03/13/2018   GIB (gastrointestinal bleeding) 03/06/2018   Post-MI pericarditis (HCC) 02/24/2018   Hyperlipidemia LDL goal <70 02/24/2018   Churg-Strauss syndrome with lung involvement (HCC) 01/08/2017   Defecation urgency 08/04/2016   Healthcare maintenance 03/18/2016   Eosinophilic esophagitis    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 anterior STEMI 01/2018 -  proximal occlusion of LAD, treated with DES. Cath also showed 20% distal  LM, 95% ostial-prox small-moderate ramus, 70-80% ostial Cx, 70% dominant ostial OM, 50-60% prox Cx, 70% RCA. EF 35% by cath with LVEDP . Med rx for residual disease. Course complicated by post MI pericarditis and LV thrombus.   Chronic systolic (congestive) heart failure (HCC)    Chronic systolic HF (heart failure) (HCC) 08/07/2021   Colitis    Colon polyp    inflammatory   Crohn disease (HCC) 1992   history uveitis, involvement of intestines and lungs   Diverticulosis    Eosinophilic granuloma (HCC)    Essential hypertension    GERD (gastroesophageal reflux disease)    GI bleeding    History of chicken pox    History of gastroesophageal reflux (GERD)    History of seizure 1995   grand mal x1, completed 6 yrs dilantin. no seizures since   Hyperlipidemia    Internal hemorrhoids    Ischemic cardiomyopathy    LV (left ventricular) mural thrombus following MI (HCC) 01/2018   Myocardial infarction (HCC)    Osteoporosis 11/2015   DEXA T -2.9  Prostate cancer (HCC) 2021   NO TX   Schwannoma 2007   L axilla s/p surgery   Seizures (HCC)    last seizure 1995 and only x 1 seizure-    Ulnar neuropathy    h/o this from L arm schwannoma    Family History  Problem Relation Age of Onset   Heart disease Mother    CAD Mother 55       CABG   Congenital heart disease Mother    Hypertension Mother    Hyperlipidemia Mother    Heart disease Father    CAD Father 47       CABG   Hypertension Father    Hyperlipidemia Father    Crohn's disease Brother    Colon polyps Maternal Grandmother    Colon cancer Maternal Grandmother 75   Diabetes Neg Hx    Esophageal cancer Neg Hx    Rectal cancer Neg Hx    Stomach cancer Neg Hx    Past Surgical History:  Procedure Laterality Date   APPENDECTOMY  2000   BACK SURGERY  2011   lumbar   BIOPSY  01/12/2023   Procedure: BIOPSY;  Surgeon: Albertus Gordy HERO, MD;  Location: [facility] ENDOS[facility];  Service: Gastroenterology;;   CARDIAC CATHETERIZATION      CARDIOVASCULAR STRESS TEST  01/2015   low risk study   CHOLECYSTECTOMY  2000   COLONOSCOPY  08/2014   f/u crohn's, 4 inflammatory polyps, diverticulosis, improved, rpt 2 yrs (Barish)   COLONOSCOPY  11/2018   multiple polyps, 1 TA, rpt 2 yrs (Pyrtle)   COLONOSCOPY WITH PROPOFOL  N/A 04/09/2018   inflammatory polyp, f/u left to primary GI - Pyrtle Lavonne, Victory CROME III, MD)   COLONOSCOPY WITH PROPOFOL  N/A 01/12/2023   SSP, chronic active colitis, inflamamtory polyp, rpt 3 yrs (Pyrtle, Gordy HERO, MD)   CORONARY ANGIOGRAPHY N/A 02/21/2018   Procedure: CORONARY ANGIOGRAPHY;  Surgeon: Claudene Victory ORN, MD;  Location: [facility] INVASIVE CV LAB;  Service: Cardiovascular;  Laterality: N/A;   CORONARY STENT INTERVENTION N/A 08/06/2021   Procedure: CORONARY STENT INTERVENTION;  Surgeon: Claudene Victory ORN, MD;  Location: [facility] INV[facility] CV LAB;  Service: Cardiovascular;  Laterality: N/A;   CORONARY/GRAFT ACUTE MI REVASCULARIZATION N/A 02/20/2018   Procedure: Coronary/Graft Acute MI Revascularization;  Surgeon: Claudene Victory ORN, MD;  Location: [facility] IN[facility] CV LAB;  Service: Cardiovascular;  Laterality: N/A;   ESOPHAGOGASTRODUODENOSCOPY  07/2014   h/o EE resolved, focal reflux esophagitis, chronic active gastritis   ESOPHAGOGASTRODUODENOSCOPY  11/2018   gastritis, no active crohn's (Pyrtle)   extremity surgery Left    ulnar nerve repair after schwannoma removal   FLEXIBLE SIGMOIDOSCOPY N/A 04/14/2018   Procedure: FLEXIBLE SIGMOIDOSCOPY;  Surgeon: Teressa Toribio SQUIBB, MD;  Location: [facility] ENDOSCOPY;  Service: Endoscopy;  Laterality: N/A;   HEMOSTASIS CLIP PLACEMENT  01/12/2023   Procedure: HEMOSTASIS CLIP PLACEMENT;  Surgeon: Albertus Gordy HERO, MD;  Location: [facility];  Service: Gastroenterology;;   ICD IMPLANT N/A 06/23/2022   Procedure: ICD IMPLANT;  Surgeon: Waddell Danelle ORN, MD;  Location: [facility] INVASIVE CV LAB;  Service: Cardiovascular;  Laterality: N/A;   INGUINAL HERNIA REPAIR Bilateral 2017   LAMINECTOMY N/A 08/09/2018    Procedure: Lumbar three Laminectomy, excision of intradural tumor;  Surgeon: Colon Victory, MD;  Location: [facility] OR;  Service: Neurosurgery;  Laterality: N/A;   LEFT HEART CATH AND CORONARY ANGIOGRAPHY N/A 02/20/2018   Procedure: LEFT HEART CATH AND CORONARY ANGIOGRAPHY;  Surgeon: Claudene Victory ORN, MD;  Location: [facility] INVASIVE CV  LAB;  Service: Cardiovascular;  Laterality: N/A;   LEFT HEART CATH AND CORONARY ANGIOGRAPHY N/A 08/06/2021   Procedure: LEFT HEART CATH AND CORONARY ANGIOGRAPHY;  Surgeon: Claudene Victory ORN, MD;  Location: [facility] INVASIVE CV LAB;  Service: Cardiovascular;  Laterality: N/A;   LUMBAR SPINE SURGERY  2011   POLYPECTOMY  04/09/2018   Procedure: POLYPECTOMY;  Surgeon: Legrand Victory LITTIE DOUGLAS, MD;  Location: [facility] ENDOSCOPY;  Service: Gastroenterology;;   POLYPECTOMY  01/12/2023   Procedure: POLYPECTOMY;  Surgeon: Albertus Gordy HERO, MD;  Location: [facility] ENDOSCOPY;  Service: Gastroenterology;;   SPINAL CORD STIMULATOR IMPLANT  2012   x2 (Dr Earlie)   SUBMUCOSAL INJECTION  04/09/2018   Procedure: SUBMUCOSAL INJECTION;  Surgeon: Legrand Victory LITTIE DOUGLAS, MD;  Location: [facility] ENDOSCOPY;  Service: Gastroenterology;;   TONSILLECTOMY  1996   TUMOR REMOVAL Left 2007   schwannoma from L armpit   UPPER GASTROINTESTINAL ENDOSCOPY     Social History   Social History Narrative   Lives with wife, 1 dog. Grown children   Edu: college   Occ: owns mattress store   Activity: active at work, started [employer], wants to restart running   Diet: good water, fruits/vegetables daily   Immunization History  Administered Date(s) Administered   Hepb-cpg 10/19/2018, 11/23/2018   Influenza Inj Mdck Quad Pf 01/01/2017   Influenza, Seasonal, Injecte, Preservative Fre 02/11/2023   Influenza,inj,Quad PF,6+ Mos 12/22/2014, 01/04/2018, 03/02/2019, 06/04/2020, 01/27/2022   Influenza-Unspecified 11/30/2015, 01/01/2017   PFIZER(Purple Top)SARS-COV-2 Vaccination 06/21/2019, 07/19/2019, 03/22/2020   PPD Test 06/08/2012   Pneumococcal  Conjugate-13 01/04/2018   Pneumococcal Polysaccharide-23 03/18/2016, 08/10/2018   Tdap 10/24/2021   Zoster Recombinant(Shingrix) 07/25/2020, 10/25/2020     Objective: Vital Signs: BP (!) 95/58   Pulse 75   Temp (!) 97.5 F (36.4 C)   Resp 16   Ht 5' 7 (1.702 m)   Wt 162 lb 3.2 oz (73.6 kg)   BMI 25.40 kg/m    Physical Exam Eyes:     Conjunctiva/sclera: Conjunctivae normal.  Cardiovascular:     Rate and Rhythm: Normal rate and regular rhythm.  Pulmonary:     Effort: Pulmonary effort is normal.     Breath sounds: Normal breath sounds.  Musculoskeletal:     Right lower leg: No edema.     Left lower leg: No edema.  Lymphadenopathy:     Cervical: No cervical adenopathy.  Skin:    General: Skin is warm and dry.  Neurological:     Mental Status: He is alert.  Psychiatric:        Mood and Affect: Mood normal.      Musculoskeletal Exam:  Shoulders full ROM no tenderness or swelling Elbows full ROM no tenderness or swelling Wrists full ROM no tenderness or swelling Fingers full ROM no tenderness or swelling Knees full ROM no tenderness or swelling Ankles full ROM no tenderness or swelling   Investigation: No additional findings.  Imaging: CUP PACEART REMOTE DEVICE CHECK Result Date: 12/24/2023 ICD Scheduled remote reviewed. Normal device function.  Presenting rhythm:  Regular VS with PVC Next remote 91 days. LA, CVRS   Recent Labs: Lab Results  Component Value Date   WBC 7.6 10/06/2023   HGB 14.6 10/06/2023   PLT 250 10/06/2023   NA 139 10/06/2023   K 4.5 10/06/2023   CL 102 10/06/2023   CO2 30 10/06/2023   GLUCOSE 90 10/06/2023   BUN 21 10/06/2023   CREATININE 1.05 10/06/2023   BILITOT 0.9 10/06/2023   ALKPHOS 44  02/04/2023   AST 21 10/06/2023   ALT 33 10/06/2023   PROT 6.2 10/06/2023   ALBUMIN 3.8 02/04/2023   CALCIUM  8.9 10/06/2023   GFRAA 88 01/27/2020   QFTBGOLDPLUS NEGATIVE 06/01/2023    Speciality Comments: No specialty comments  available.  Procedures:  No procedures performed Allergies: Cymbalta [duloxetine hcl], Infliximab, and Keflex [cephalexin]   Assessment / Plan:     Visit Diagnoses: Churg-Strauss syndrome with lung involvement (HCC) - Plan: predniSONE  (DELTASONE ) 10 MG tablet, predniSONE  (DELTASONE ) 1 MG tablet EGPA controlled with prednisone ; dose reduction previously caused lung flare-ups.  No particular signs of extrapulmonary involvement has had no new episodes of cough or dyspnea.  We discussed risk benefit of continuing the current regimen versus trying to further taper prednisone  that may reduce his long-term complications with regards to bone density infection risk of cardiovascular disease but could have risk of flareups.  I think a very good long-term goal be to get down to 10 mg prednisone , lower than that will be a challenging prospect as he would almost certainly have secondary adrenal insufficiency. - Continuing prednisone  at 13 mg daily for now, can increase back to 14 if experiences additional respiratory symptoms - Rechecking CRP and blood counts for markers of inflammation or eosinophilia  On prednisone  therapy - Proceeding with very gradual stepwise decreasing long term goal down to 10 mg daily - Plan: alendronate  (FOSAMAX ) 70 MG tablet  High risk medication use - Fosamax  70 mg p.o. weekly Has been tolerating prednisone .  No specific side effects with the steroid besides the easy bruising that is ongoing.  Remains on Fosamax  70 mg weekly for associated osteoporosis. - Checking CBC and CMP for medication monitoring on continued treatment with multiple medications including prednisone  azathioprine  and Fosamax   Crohn's disease of both small and large intestine without complication (HCC) Well-managed with Imuran  150 mg daily and Entyvio  infusions, no symptoms or complications.        Orders: Orders Placed This Encounter  Procedures   C-reactive protein   CBC with Differential/Platelet    Comprehensive metabolic panel with GFR   Meds ordered this encounter  Medications   predniSONE  (DELTASONE ) 10 MG tablet    Sig: Take 1 tablet (10 mg total) by mouth daily with breakfast. In combination with 1 mg tablets for total 14 mg daily    Dispense:  90 tablet    Refill:  0   predniSONE  (DELTASONE ) 1 MG tablet    Sig: Take 4 tablets (4 mg total) by mouth daily with breakfast. Take in combination with 10 mg tablet for total of 14 mg daily.    Dispense:  360 tablet    Refill:  0   alendronate  (FOSAMAX ) 70 MG tablet    Sig: Take 1 tablet (70 mg total) by mouth every 7 (seven) days. On Saturday    Dispense:  12 tablet    Refill:  0     Follow-Up Instructions: Return in about 4 months (around 05/11/2024) for EGPA on VEDO/AZA/GC f/u 3-32mos.   Lonni LELON Ester, MD  Note - This record has been created using [employer].  Chart creation errors have been sought, but may not always  have been located. Such creation errors do not reflect on  the standard of medical care.

## 2024-01-13 ENCOUNTER — Other Ambulatory Visit (HOSPITAL_COMMUNITY): Payer: Self-pay

## 2024-01-13 ENCOUNTER — Other Ambulatory Visit: Payer: Self-pay

## 2024-01-13 ENCOUNTER — Other Ambulatory Visit: Payer: Self-pay | Admitting: Family Medicine

## 2024-01-13 ENCOUNTER — Encounter (HOSPITAL_COMMUNITY): Payer: Self-pay

## 2024-01-13 DIAGNOSIS — B078 Other viral warts: Secondary | ICD-10-CM | POA: Diagnosis not present

## 2024-01-13 DIAGNOSIS — L720 Epidermal cyst: Secondary | ICD-10-CM | POA: Diagnosis not present

## 2024-01-13 DIAGNOSIS — D692 Other nonthrombocytopenic purpura: Secondary | ICD-10-CM | POA: Diagnosis not present

## 2024-01-13 DIAGNOSIS — F5104 Psychophysiologic insomnia: Secondary | ICD-10-CM

## 2024-01-13 DIAGNOSIS — D235 Other benign neoplasm of skin of trunk: Secondary | ICD-10-CM | POA: Diagnosis not present

## 2024-01-13 DIAGNOSIS — D2262 Melanocytic nevi of left upper limb, including shoulder: Secondary | ICD-10-CM | POA: Diagnosis not present

## 2024-01-13 DIAGNOSIS — D225 Melanocytic nevi of trunk: Secondary | ICD-10-CM | POA: Diagnosis not present

## 2024-01-13 DIAGNOSIS — L814 Other melanin hyperpigmentation: Secondary | ICD-10-CM | POA: Diagnosis not present

## 2024-01-13 DIAGNOSIS — D2361 Other benign neoplasm of skin of right upper limb, including shoulder: Secondary | ICD-10-CM | POA: Diagnosis not present

## 2024-01-13 DIAGNOSIS — D2261 Melanocytic nevi of right upper limb, including shoulder: Secondary | ICD-10-CM | POA: Diagnosis not present

## 2024-01-13 DIAGNOSIS — L738 Other specified follicular disorders: Secondary | ICD-10-CM | POA: Diagnosis not present

## 2024-01-13 MED ORDER — AMITRIPTYLINE HCL 25 MG PO TABS
25.0000 mg | ORAL_TABLET | Freq: Every evening | ORAL | 0 refills | Status: DC | PRN
  Filled 2024-01-13: qty 90, 90d supply, fill #0

## 2024-01-15 ENCOUNTER — Other Ambulatory Visit (HOSPITAL_COMMUNITY): Payer: Self-pay

## 2024-01-16 ENCOUNTER — Other Ambulatory Visit (HOSPITAL_COMMUNITY): Payer: Self-pay

## 2024-01-19 ENCOUNTER — Encounter: Payer: Self-pay | Admitting: Internal Medicine

## 2024-01-19 ENCOUNTER — Ambulatory Visit: Attending: Internal Medicine | Admitting: Internal Medicine

## 2024-01-19 ENCOUNTER — Other Ambulatory Visit (HOSPITAL_COMMUNITY): Payer: Self-pay

## 2024-01-19 VITALS — BP 95/58 | HR 75 | Temp 97.5°F | Resp 16 | Ht 67.0 in | Wt 162.2 lb

## 2024-01-19 DIAGNOSIS — D7218 Eosinophilia in diseases classified elsewhere: Secondary | ICD-10-CM | POA: Diagnosis not present

## 2024-01-19 DIAGNOSIS — D72119 Hypereosinophilic syndrome (hes), unspecified: Secondary | ICD-10-CM | POA: Diagnosis not present

## 2024-01-19 DIAGNOSIS — M301 Polyarteritis with lung involvement [Churg-Strauss]: Secondary | ICD-10-CM | POA: Diagnosis not present

## 2024-01-19 DIAGNOSIS — Z79899 Other long term (current) drug therapy: Secondary | ICD-10-CM

## 2024-01-19 DIAGNOSIS — K5080 Crohn's disease of both small and large intestine without complications: Secondary | ICD-10-CM | POA: Diagnosis not present

## 2024-01-19 DIAGNOSIS — Z7952 Long term (current) use of systemic steroids: Secondary | ICD-10-CM

## 2024-01-19 MED ORDER — PREDNISONE 10 MG PO TABS
10.0000 mg | ORAL_TABLET | Freq: Every day | ORAL | 0 refills | Status: DC
  Filled 2024-01-19: qty 90, 90d supply, fill #0

## 2024-01-19 MED ORDER — ALENDRONATE SODIUM 70 MG PO TABS
70.0000 mg | ORAL_TABLET | ORAL | 0 refills | Status: AC
  Filled 2024-01-19: qty 12, 84d supply, fill #0

## 2024-01-19 MED ORDER — PREDNISONE 1 MG PO TABS
4.0000 mg | ORAL_TABLET | Freq: Every day | ORAL | 0 refills | Status: DC
  Filled 2024-01-19: qty 360, 90d supply, fill #0

## 2024-01-20 ENCOUNTER — Encounter: Payer: Self-pay | Admitting: Family Medicine

## 2024-01-20 LAB — CBC WITH DIFFERENTIAL/PLATELET
Absolute Lymphocytes: 655 {cells}/uL — ABNORMAL LOW (ref 850–3900)
Absolute Monocytes: 819 {cells}/uL (ref 200–950)
Basophils Absolute: 73 {cells}/uL (ref 0–200)
Basophils Relative: 0.8 %
Eosinophils Absolute: 228 {cells}/uL (ref 15–500)
Eosinophils Relative: 2.5 %
HCT: 45.9 % (ref 38.5–50.0)
Hemoglobin: 15 g/dL (ref 13.2–17.1)
MCH: 31.9 pg (ref 27.0–33.0)
MCHC: 32.7 g/dL (ref 32.0–36.0)
MCV: 97.7 fL (ref 80.0–100.0)
MPV: 10.2 fL (ref 7.5–12.5)
Monocytes Relative: 9 %
Neutro Abs: 7326 {cells}/uL (ref 1500–7800)
Neutrophils Relative %: 80.5 %
Platelets: 288 Thousand/uL (ref 140–400)
RBC: 4.7 Million/uL (ref 4.20–5.80)
RDW: 13.6 % (ref 11.0–15.0)
Total Lymphocyte: 7.2 %
WBC: 9.1 Thousand/uL (ref 3.8–10.8)

## 2024-01-20 LAB — COMPREHENSIVE METABOLIC PANEL WITH GFR
AG Ratio: 1.7 (calc) (ref 1.0–2.5)
ALT: 25 U/L (ref 9–46)
AST: 16 U/L (ref 10–35)
Albumin: 4.2 g/dL (ref 3.6–5.1)
Alkaline phosphatase (APISO): 54 U/L (ref 35–144)
BUN/Creatinine Ratio: 32 (calc) — ABNORMAL HIGH (ref 6–22)
BUN: 32 mg/dL — ABNORMAL HIGH (ref 7–25)
CO2: 28 mmol/L (ref 20–32)
Calcium: 9.4 mg/dL (ref 8.6–10.3)
Chloride: 104 mmol/L (ref 98–110)
Creat: 1.01 mg/dL (ref 0.70–1.30)
Globulin: 2.5 g/dL (ref 1.9–3.7)
Glucose, Bld: 100 mg/dL — ABNORMAL HIGH (ref 65–99)
Potassium: 4.5 mmol/L (ref 3.5–5.3)
Sodium: 140 mmol/L (ref 135–146)
Total Bilirubin: 1.1 mg/dL (ref 0.2–1.2)
Total Protein: 6.7 g/dL (ref 6.1–8.1)
eGFR: 87 mL/min/1.73m2 (ref >=60)

## 2024-01-20 LAB — C-REACTIVE PROTEIN: CRP: 9.9 mg/L — ABNORMAL HIGH (ref <8.0)

## 2024-01-21 ENCOUNTER — Telehealth: Payer: Self-pay

## 2024-01-21 ENCOUNTER — Other Ambulatory Visit (HOSPITAL_COMMUNITY): Payer: Self-pay

## 2024-01-21 NOTE — Telephone Encounter (Signed)
 Rep for Jack [name] came in the office today. The company now has a 3 year start program for approval of entyvio  injections if insurance does not cover it. Patient assistance should be able to reach reimbursement rep Velma Limes at [phone]. Sending this to rx prior auth team to see if patient can be set up for this thorough pt assistance.

## 2024-01-22 NOTE — Telephone Encounter (Signed)
 From what I am reading about this, the patient has to be new to therapy, which he is not. I can attempt the regular patient assistance plan and see if he is eligible

## 2024-01-25 ENCOUNTER — Other Ambulatory Visit (HOSPITAL_COMMUNITY): Payer: Self-pay

## 2024-01-25 NOTE — Telephone Encounter (Signed)
 Contacted Entyvio  to get further information regarding the [employer]. As stated, he has to be new to therapy and not have taken any infusions yet. But there is another plan called the Start Program (confusing, I know). But this program he is eligible for as he would be new to therapy to the pens. I will get this specific form filled out and processed.

## 2024-01-25 NOTE — Telephone Encounter (Signed)
 The rep spoke with the pts wife when she was here and did not say they had to be a new start for the 3 year approval,

## 2024-01-25 NOTE — Telephone Encounter (Signed)
Great thank you so much :)

## 2024-01-27 ENCOUNTER — Ambulatory Visit (INDEPENDENT_AMBULATORY_CARE_PROVIDER_SITE_OTHER)

## 2024-01-27 VITALS — BP 110/71 | HR 65 | Temp 97.8°F | Resp 16 | Ht 67.0 in | Wt 162.2 lb

## 2024-01-27 DIAGNOSIS — K5080 Crohn's disease of both small and large intestine without complications: Secondary | ICD-10-CM

## 2024-01-27 MED ORDER — VEDOLIZUMAB 300 MG IV SOLR
300.0000 mg | Freq: Once | INTRAVENOUS | Status: AC
  Administered 2024-01-27: 300 mg via INTRAVENOUS
  Filled 2024-01-27: qty 5

## 2024-01-27 NOTE — Progress Notes (Signed)
 Diagnosis: Crohn's Disease  Provider:  Praveen Mannam MD  Procedure: IV Infusion  IV Type: Peripheral, IV Location: L Antecubital  Entyvio  (Vedolizumab ), Dose: 300 mg  Infusion Start Time: 0929  Infusion Stop Time: 1000  Post Infusion IV Care: Peripheral IV Discontinued  Discharge: Condition: Good, Destination: Home . AVS Declined  Performed by:  Leita FORBES Miles, LPN

## 2024-01-31 ENCOUNTER — Other Ambulatory Visit: Payer: Self-pay | Admitting: Family Medicine

## 2024-01-31 DIAGNOSIS — M818 Other osteoporosis without current pathological fracture: Secondary | ICD-10-CM

## 2024-01-31 DIAGNOSIS — E785 Hyperlipidemia, unspecified: Secondary | ICD-10-CM

## 2024-01-31 DIAGNOSIS — D72119 Hypereosinophilic syndrome (hes), unspecified: Secondary | ICD-10-CM

## 2024-01-31 DIAGNOSIS — C61 Malignant neoplasm of prostate: Secondary | ICD-10-CM

## 2024-01-31 DIAGNOSIS — K5080 Crohn's disease of both small and large intestine without complications: Secondary | ICD-10-CM

## 2024-02-01 ENCOUNTER — Other Ambulatory Visit: Payer: Self-pay | Admitting: Family Medicine

## 2024-02-01 ENCOUNTER — Other Ambulatory Visit: Payer: Self-pay

## 2024-02-01 ENCOUNTER — Other Ambulatory Visit (HOSPITAL_COMMUNITY): Payer: Self-pay

## 2024-02-01 DIAGNOSIS — K200 Eosinophilic esophagitis: Secondary | ICD-10-CM

## 2024-02-03 ENCOUNTER — Other Ambulatory Visit (HOSPITAL_COMMUNITY): Payer: Self-pay

## 2024-02-03 ENCOUNTER — Telehealth: Payer: Self-pay | Admitting: Family Medicine

## 2024-02-03 MED ORDER — PANTOPRAZOLE SODIUM 40 MG PO TBEC
40.0000 mg | DELAYED_RELEASE_TABLET | Freq: Every day | ORAL | 0 refills | Status: DC
  Filled 2024-02-03: qty 90, 90d supply, fill #0

## 2024-02-03 NOTE — Telephone Encounter (Addendum)
 If he just needs to reschedule physical, ok to place in open 30 min slot over the next month.

## 2024-02-03 NOTE — Telephone Encounter (Signed)
 Copied from CRM [id number]. Topic: Appointments - Scheduling Inquiry for Clinic >> Feb 03, 2024 10:15 AM Sophia H wrote: Reason for CRM: Patient requesting to move physical 11/14 - states he needs to get it done this year. Wanting to know if he can be worked in or see another provider. Availability on my end for January if moving physical. # [phone]

## 2024-02-03 NOTE — Telephone Encounter (Deleted)
 If he just needs to reschedule physical, ok to place in open 30 min slot over the next month.

## 2024-02-03 NOTE — Telephone Encounter (Signed)
 Lvm need to clarify when pt is needing to be scheduled. Okay to relay and schedule if appropriate.

## 2024-02-05 ENCOUNTER — Other Ambulatory Visit: Payer: Commercial Managed Care - PPO

## 2024-02-11 NOTE — Telephone Encounter (Signed)
 Velma, animal nutritionist with [employer] is calling to speak with Rosina regarding a PA for this patient. Asking that she call back at [phone]

## 2024-02-12 ENCOUNTER — Encounter: Payer: Commercial Managed Care - PPO | Admitting: Family Medicine

## 2024-02-12 NOTE — Telephone Encounter (Signed)
 This was not done, his cpe for this morning at 8am was not rescheduled.  Please reschedule for this year at his convenience.

## 2024-02-12 NOTE — Telephone Encounter (Signed)
 Thank you :)

## 2024-02-15 ENCOUNTER — Other Ambulatory Visit (HOSPITAL_COMMUNITY): Payer: Self-pay

## 2024-02-15 NOTE — Telephone Encounter (Signed)
 Spoke with [place], application requires denial within last 6 months. Will resubmit PA to get denial and submit for processing. If approved, patient is signed up for savings program that should bring co-pay to $5.00. Will update.

## 2024-02-22 ENCOUNTER — Other Ambulatory Visit (INDEPENDENT_AMBULATORY_CARE_PROVIDER_SITE_OTHER)

## 2024-02-22 DIAGNOSIS — E785 Hyperlipidemia, unspecified: Secondary | ICD-10-CM

## 2024-02-22 DIAGNOSIS — M818 Other osteoporosis without current pathological fracture: Secondary | ICD-10-CM

## 2024-02-22 LAB — COMPREHENSIVE METABOLIC PANEL WITH GFR
ALT: 21 U/L (ref 0–53)
AST: 19 U/L (ref 0–37)
Albumin: 3.8 g/dL (ref 3.5–5.2)
Alkaline Phosphatase: 45 U/L (ref 39–117)
BUN: 23 mg/dL (ref 6–23)
CO2: 29 meq/L (ref 19–32)
Calcium: 8.7 mg/dL (ref 8.4–10.5)
Chloride: 104 meq/L (ref 96–112)
Creatinine, Ser: 1.03 mg/dL (ref 0.40–1.50)
GFR: 81.17 mL/min
Glucose, Bld: 88 mg/dL (ref 70–99)
Potassium: 4.3 meq/L (ref 3.5–5.1)
Sodium: 139 meq/L (ref 135–145)
Total Bilirubin: 0.8 mg/dL (ref 0.2–1.2)
Total Protein: 6 g/dL (ref 6.0–8.3)

## 2024-02-22 LAB — LIPID PANEL
Cholesterol: 94 mg/dL (ref 0–200)
HDL: 32.3 mg/dL — ABNORMAL LOW
LDL Cholesterol: 45 mg/dL (ref 0–99)
NonHDL: 61.54
Total CHOL/HDL Ratio: 3
Triglycerides: 83 mg/dL (ref 0.0–149.0)
VLDL: 16.6 mg/dL (ref 0.0–40.0)

## 2024-02-22 LAB — VITAMIN D 25 HYDROXY (VIT D DEFICIENCY, FRACTURES): VITD: 34.06 ng/mL (ref 30.00–100.00)

## 2024-02-24 ENCOUNTER — Ambulatory Visit (INDEPENDENT_AMBULATORY_CARE_PROVIDER_SITE_OTHER)

## 2024-02-24 ENCOUNTER — Other Ambulatory Visit (HOSPITAL_COMMUNITY): Payer: Self-pay

## 2024-02-24 VITALS — BP 107/64 | HR 69 | Temp 97.6°F | Resp 16 | Ht 67.0 in | Wt 161.6 lb

## 2024-02-24 DIAGNOSIS — K5080 Crohn's disease of both small and large intestine without complications: Secondary | ICD-10-CM | POA: Diagnosis not present

## 2024-02-24 MED ORDER — VEDOLIZUMAB 300 MG IV SOLR
300.0000 mg | Freq: Once | INTRAVENOUS | Status: AC
  Administered 2024-02-24: 300 mg via INTRAVENOUS
  Filled 2024-02-24: qty 5

## 2024-02-24 NOTE — Progress Notes (Signed)
 Diagnosis: Crohn's Disease  Provider:  Praveen Mannam MD  Procedure: IV Infusion  IV Type: Peripheral, IV Location: L Antecubital   Entyvio  (Vedolizumab ), Dose: 300 mg  Infusion Start Time: 0932  Infusion Stop Time: 1007  Post Infusion IV Care: Peripheral IV Discontinued  Discharge: Condition: Good, Destination: Home . AVS Declined  Performed by:  Eleanor DELENA Bloch, RN

## 2024-02-29 ENCOUNTER — Ambulatory Visit: Payer: Self-pay | Admitting: Family Medicine

## 2024-03-02 ENCOUNTER — Other Ambulatory Visit (HOSPITAL_COMMUNITY): Payer: Self-pay

## 2024-03-02 ENCOUNTER — Encounter: Payer: Self-pay | Admitting: Family Medicine

## 2024-03-02 ENCOUNTER — Ambulatory Visit: Admitting: Family Medicine

## 2024-03-02 ENCOUNTER — Other Ambulatory Visit: Payer: Self-pay | Admitting: Internal Medicine

## 2024-03-02 ENCOUNTER — Other Ambulatory Visit: Payer: Self-pay

## 2024-03-02 VITALS — BP 118/70 | HR 64 | Temp 97.8°F | Ht 67.0 in | Wt 161.8 lb

## 2024-03-02 DIAGNOSIS — Z0000 Encounter for general adult medical examination without abnormal findings: Secondary | ICD-10-CM

## 2024-03-02 DIAGNOSIS — E785 Hyperlipidemia, unspecified: Secondary | ICD-10-CM | POA: Diagnosis not present

## 2024-03-02 DIAGNOSIS — Z23 Encounter for immunization: Secondary | ICD-10-CM | POA: Diagnosis not present

## 2024-03-02 DIAGNOSIS — I10 Essential (primary) hypertension: Secondary | ICD-10-CM | POA: Diagnosis not present

## 2024-03-02 DIAGNOSIS — K200 Eosinophilic esophagitis: Secondary | ICD-10-CM | POA: Diagnosis not present

## 2024-03-02 DIAGNOSIS — C61 Malignant neoplasm of prostate: Secondary | ICD-10-CM

## 2024-03-02 DIAGNOSIS — F5104 Psychophysiologic insomnia: Secondary | ICD-10-CM | POA: Diagnosis not present

## 2024-03-02 DIAGNOSIS — Z7952 Long term (current) use of systemic steroids: Secondary | ICD-10-CM

## 2024-03-02 DIAGNOSIS — D7218 Eosinophilia in diseases classified elsewhere: Secondary | ICD-10-CM

## 2024-03-02 DIAGNOSIS — M301 Polyarteritis with lung involvement [Churg-Strauss]: Secondary | ICD-10-CM | POA: Diagnosis not present

## 2024-03-02 DIAGNOSIS — M818 Other osteoporosis without current pathological fracture: Secondary | ICD-10-CM

## 2024-03-02 DIAGNOSIS — R350 Frequency of micturition: Secondary | ICD-10-CM

## 2024-03-02 DIAGNOSIS — N529 Male erectile dysfunction, unspecified: Secondary | ICD-10-CM

## 2024-03-02 DIAGNOSIS — I5020 Unspecified systolic (congestive) heart failure: Secondary | ICD-10-CM

## 2024-03-02 DIAGNOSIS — K5080 Crohn's disease of both small and large intestine without complications: Secondary | ICD-10-CM

## 2024-03-02 DIAGNOSIS — I2510 Atherosclerotic heart disease of native coronary artery without angina pectoris: Secondary | ICD-10-CM

## 2024-03-02 DIAGNOSIS — N401 Enlarged prostate with lower urinary tract symptoms: Secondary | ICD-10-CM

## 2024-03-02 MED ORDER — VITAMIN D3 25 MCG (1000 UT) PO CAPS: 1.0000 | ORAL_CAPSULE | Freq: Every day | ORAL | Status: AC

## 2024-03-02 MED ORDER — AZATHIOPRINE 50 MG PO TABS
150.0000 mg | ORAL_TABLET | Freq: Every day | ORAL | 1 refills | Status: AC
  Filled 2024-03-02: qty 270, 90d supply, fill #0

## 2024-03-02 MED ORDER — PANTOPRAZOLE SODIUM 40 MG PO TBEC
40.0000 mg | DELAYED_RELEASE_TABLET | Freq: Every day | ORAL | 3 refills | Status: AC
  Filled 2024-03-02 – 2024-04-29 (×2): qty 90, 90d supply, fill #0

## 2024-03-02 MED ORDER — TADALAFIL 20 MG PO TABS
10.0000 mg | ORAL_TABLET | Freq: Every day | ORAL | 5 refills | Status: AC | PRN
  Filled 2024-03-02: qty 6, 30d supply, fill #0

## 2024-03-02 MED ORDER — AMITRIPTYLINE HCL 25 MG PO TABS
25.0000 mg | ORAL_TABLET | Freq: Every evening | ORAL | 3 refills | Status: AC | PRN
  Filled 2024-03-02 – 2024-04-28 (×2): qty 90, 90d supply, fill #0

## 2024-03-02 NOTE — Assessment & Plan Note (Signed)
 Preventative protocols reviewed and updated unless pt declined. Discussed healthy diet and lifestyle.

## 2024-03-02 NOTE — Assessment & Plan Note (Signed)
Appreciate cardiology care.  °

## 2024-03-02 NOTE — Assessment & Plan Note (Signed)
 Chronic, stable on current regimen - continue.

## 2024-03-02 NOTE — Progress Notes (Signed)
 Ph: [phone] Fax: [phone]   Patient ID: Jack [name], male    DOB: [birth year], 58 y.o.   MRN: [id number]  This visit was conducted in person.  BP 118/70 (Cuff Size: Normal)   Pulse 64   Temp 97.8 F (36.6 C) (Oral)   Ht 5' 7 (1.702 m)   Wt 161 lb 12.8 oz (73.4 kg)   SpO2 100%   BMI 25.34 kg/m    CC: CPE Subjective:   HPI: Jack [name] is a 58 y.o. male presenting on 03/02/2024 for Annual Exam (Would like to lower his 20mg  tadalafil  to 10mg ,//Wants prevnar and flu vax today)   Known hypereosinophilic syndrome (Churg Strauss syndrome with lung involvement as well as Eosinophilic Esophagitis), seizure disorder, crohn's disease, and CAD s/p STEMI of LAD 01/2018 s/p DES with resultant ischemic cardiomyopathy ER 35% with LV thrombus s/p coumadin  now only on plavix  due to GI bleed. Had further coronary revascularization 07/2021. He did have lumbar spine schwannoma removed (Jack [name]).    Ischemic CM/CHF with EF 30% with ICD in place. Sees EP Dr Waddell and cards Dr Dann --> Jack [name]. Has declined sleep study.    Crohn's disease followed by GI Dr Albertus on Entyvio  300mg  Q4 wk infusions as well as azathioprine  150mg  daily. Latest colonoscopy 12/2022.    Has seen [facility], and pulmonology, not recently.   Sees [facility] rheumatology Dr Jeannetta on daily prednisone  currently at 14mg  dose planned slow taper, long term goal ideally 10mg  daily. He had trouble tolerating 13mg  dose (increased nasal congestion).  He is on weekly fosamax  for chronic steroid use associated osteoporosis.    Possible cluster HAs improved when he quit drinking bourbon.   Preventative: COLONOSCOPY during hospitalization 01/2015 - active ileitis/colitis, f/u 2 yrs ([place]). Has established with Dr Albertus COLONOSCOPY 11/2018 multiple polyps, 1 TA, rpt 2 yrs (Pyrtle) Colonoscopy 12/2022 - SSP, chronic active colitis, inflamamtory polyp, rpt 3 yrs (Pyrtle) Prostate cancer - sees urology Sydell) q6 mo, on  finasteride  for BPH symptoms. Last seen 10/2023. DEXA scan - 11/2015 T -2.9 Osteoporosis. Takes calcium /vit D with fosamax .  DEXA 06/2017 - T -3.0 femoral neck [facility]).  DEXA 08/2019 - records requested - no records at [facility]. He continues Fosamax  weekly.  Lung cancer screen - not eligible  Flu shot yearly  COVID vaccine - Pfizer 05/2019, 06/2019, booster 02/2020 Tdap 09/2021 Pneumovax 2017, 07/2018, prevnar-13 12/2017, prevar-20 today Shingrix - 06/2020, 09/2020 Seat belt use discussed Sunscreen use discussed. No changing moles on skin. Sees [place] [facility].  Sleep - averaging 7-8 hours/night  Non smoker. Rare cigar.  Alcohol - occasionally  Dentist q6 mo Eye exam yearly  Bowel - no constipation    Lives with wife. Grown children  Edu: college  Occ: owns mattress store  Activity: treadmill 5d/wk 30 min  Diet: good water, fruits/vegetables daily      Relevant past medical, surgical, family and social history reviewed and updated as indicated. Interim medical history since our last visit reviewed. Allergies and medications reviewed and updated. Outpatient Medications Prior to Visit  Medication Sig Dispense Refill   acetaminophen  (TYLENOL ) 325 MG tablet Take 2 tablets (650 mg total) by mouth every 4 (four) hours as needed for headache or mild pain.     alendronate  (FOSAMAX ) 70 MG tablet Take 1 tablet (70 mg total) by mouth every 7 (seven) days. On Saturday 12 tablet 0   aspirin  EC 81 MG tablet Take 81 mg by mouth daily.  atorvastatin  (LIPITOR ) 80 MG tablet Take 1 tablet (80 mg total) by mouth every evening at 6pm. 90 tablet 3   azaTHIOprine  (IMURAN ) 50 MG tablet Take 3 tablets (150 mg total) by mouth at bedtime.CALL TO SCHEDULE AN APPT. 270 tablet 1   Calcium  Carb-Cholecalciferol  (CALCIUM  + VITAMIN D3) 600-5 MG-MCG TABS Take 1 tablet by mouth daily.     carvedilol  (COREG ) 3.125 MG tablet TAKE 1 TABLET BY MOUTH 2 TIMES DAILY. 180 tablet 3   clopidogrel  (PLAVIX ) 75 MG tablet Take 1 tablet  (75 mg total) by mouth daily. 90 tablet 2   dapagliflozin  propanediol (FARXIGA ) 10 MG TABS tablet Take 1 tablet (10 mg total) by mouth daily before breakfast. 90 tablet 3   diphenoxylate -atropine  (LOMOTIL ) 2.5-0.025 MG tablet Take 1 tablet by mouth 4 (four) times daily as needed for diarrhea or loose stools.     ezetimibe  (ZETIA ) 10 MG tablet Take 1 tablet (10 mg total) by mouth daily. 90 tablet 2   finasteride  (PROSCAR ) 5 MG tablet Take 1 tablet (5 mg total) by mouth daily. 90 tablet 3   icosapent  Ethyl (VASCEPA ) 1 g capsule Take 2 capsules (2 g total) by mouth 2 (two) times daily. 120 capsule 11   levofloxacin  (LEVAQUIN ) 750 MG tablet Take 1 tablet (750 mg total) by mouth on morning of procedure. 1 tablet 0   nitroGLYCERIN  (NITROSTAT ) 0.4 MG SL tablet Place 1 tablet (0.4 mg total) under the tongue every 5 (five) minutes as needed for chest pain. 25 tablet 4   OVER THE COUNTER MEDICATION Apply 1 Application topically daily. Athlete's foot cream     predniSONE  (DELTASONE ) 1 MG tablet Take 4 tablets (4 mg total) by mouth daily with breakfast. Take in combination with 10 mg tablet for total of 14 mg daily. 360 tablet 0   predniSONE  (DELTASONE ) 10 MG tablet Take 1 tablet (10 mg total) by mouth daily with breakfast. In combination with 1 mg tablets for total 14 mg daily 90 tablet 0   sacubitril -valsartan  (ENTRESTO ) 24-26 MG Take 1 tablet by mouth 2 (two) times daily. 180 tablet 3   spironolactone  (ALDACTONE ) 25 MG tablet Take 0.5 tablets (12.5 mg total) by mouth every Monday, Wednesday, and Friday. 24 tablet 3   tadalafil  (CIALIS ) 5 MG tablet Take 1 tablet (5 mg total) by mouth daily. 90 tablet 3   vedolizumab  (ENTYVIO ) 300 MG injection Inject 300 mg into the vein every 28 (twenty-eight) days.     amitriptyline  (ELAVIL ) 25 MG tablet Take 1 tablet (25 mg total) by mouth at bedtime as needed for sleep. 90 tablet 0   pantoprazole  (PROTONIX ) 40 MG tablet Take 1 tablet (40 mg total) by mouth daily. 90 tablet  0   tadalafil  (CIALIS ) 20 MG tablet Take 1 tablet (20 mg total) by mouth daily as needed for erectile dysfunction. (Patient taking differently: Take 10 mg by mouth daily as needed for erectile dysfunction.) 20 tablet 6   verapamil  (CALAN -SR) 120 MG CR tablet Take 1 tablet (120 mg total) by mouth 2 (two) times daily for 2 days. As needed for cluster headaches (Patient not taking: Reported on 03/02/2024) 60 tablet 1   Facility-Administered Medications Prior to Visit  Medication Dose Route Frequency Provider Last Rate Last Admin   ondansetron  (ZOFRAN ) 4 mg in sodium chloride  0.9 % 50 mL IVPB  4 mg Intravenous Q6H PRN Colon Shove, MD         Per HPI unless specifically indicated in ROS section below Review of  Systems  Constitutional:  Negative for activity change, appetite change, chills, fatigue, fever and unexpected weight change.  HENT:  Negative for hearing loss.   Eyes:  Negative for visual disturbance.  Respiratory:  Negative for cough, chest tightness, shortness of breath and wheezing.   Cardiovascular:  Negative for chest pain, palpitations and leg swelling.  Gastrointestinal:  Negative for abdominal distention, abdominal pain, blood in stool, constipation, diarrhea, nausea and vomiting.  Genitourinary:  Negative for difficulty urinating and hematuria.  Musculoskeletal:  Negative for arthralgias, myalgias and neck pain.  Skin:  Negative for rash.  Neurological:  Negative for dizziness, seizures, syncope and headaches.  Hematological:  Negative for adenopathy. Bruises/bleeds easily.  Psychiatric/Behavioral:  Negative for dysphoric mood. The patient is not nervous/anxious.     Objective:  BP 118/70 (Cuff Size: Normal)   Pulse 64   Temp 97.8 F (36.6 C) (Oral)   Ht 5' 7 (1.702 m)   Wt 161 lb 12.8 oz (73.4 kg)   SpO2 100%   BMI 25.34 kg/m   Wt Readings from Last 3 Encounters:  03/02/24 161 lb 12.8 oz (73.4 kg)  02/24/24 161 lb 9.6 oz (73.3 kg)  01/27/24 162 lb 3.2 oz (73.6 kg)       Physical Exam Vitals and nursing note reviewed.  Constitutional:      General: He is not in acute distress.    Appearance: Normal appearance. He is well-developed. He is not ill-appearing.  HENT:     Head: Normocephalic and atraumatic.     Right Ear: Hearing, tympanic membrane, ear canal and external ear normal.     Left Ear: Hearing, tympanic membrane, ear canal and external ear normal.     Mouth/Throat:     Mouth: Mucous membranes are moist.     Pharynx: Oropharynx is clear. No oropharyngeal exudate or posterior oropharyngeal erythema.  Eyes:     General: No scleral icterus.    Extraocular Movements: Extraocular movements intact.     Conjunctiva/sclera: Conjunctivae normal.     Pupils: Pupils are equal, round, and reactive to light.  Neck:     Thyroid : No thyroid  mass or thyromegaly.     Vascular: No carotid bruit.  Cardiovascular:     Rate and Rhythm: Normal rate and regular rhythm.     Pulses: Normal pulses.          Radial pulses are 2+ on the right side and 2+ on the left side.     Heart sounds: Normal heart sounds. No murmur heard. Pulmonary:     Effort: Pulmonary effort is normal. No respiratory distress.     Breath sounds: Normal breath sounds. No wheezing, rhonchi or rales.  Abdominal:     General: Bowel sounds are normal. There is no distension.     Palpations: Abdomen is soft. There is no mass.     Tenderness: There is no abdominal tenderness. There is no guarding or rebound.     Hernia: No hernia is present.  Musculoskeletal:        General: Normal range of motion.     Cervical back: Normal range of motion and neck supple.     Right lower leg: No edema.     Left lower leg: No edema.  Lymphadenopathy:     Cervical: No cervical adenopathy.  Skin:    General: Skin is warm and dry.     Findings: No rash.  Neurological:     General: No focal deficit present.     Mental  Status: He is alert and oriented to person, place, and time.  Psychiatric:        Mood  and Affect: Mood normal.        Behavior: Behavior normal.        Thought Content: Thought content normal.        Judgment: Judgment normal.       Results for orders placed or performed in visit on 02/22/24  VITAMIN D  25 Hydroxy (Vit-D Deficiency, Fractures)   Collection Time: 02/22/24  8:16 AM  Result Value Ref Range   VITD 34.06 30.00 - 100.00 ng/mL  Comprehensive metabolic panel with GFR   Collection Time: 02/22/24  8:16 AM  Result Value Ref Range   Sodium 139 135 - 145 mEq/L   Potassium 4.3 3.5 - 5.1 mEq/L   Chloride 104 96 - 112 mEq/L   CO2 29 19 - 32 mEq/L   Glucose, Bld 88 70 - 99 mg/dL   BUN 23 6 - 23 mg/dL   Creatinine, Ser 8.96 0.40 - 1.50 mg/dL   Total Bilirubin 0.8 0.2 - 1.2 mg/dL   Alkaline Phosphatase 45 39 - 117 U/L   AST 19 0 - 37 U/L   ALT 21 0 - 53 U/L   Total Protein 6.0 6.0 - 8.3 g/dL   Albumin 3.8 3.5 - 5.2 g/dL   GFR 18.82 >39.99 mL/min   Calcium  8.7 8.4 - 10.5 mg/dL  Lipid panel   Collection Time: 02/22/24  8:16 AM  Result Value Ref Range   Cholesterol 94 0 - 200 mg/dL   Triglycerides 16.9 0.0 - 149.0 mg/dL   HDL 67.69 (L) >60.99 mg/dL   VLDL 83.3 0.0 - 59.9 mg/dL   LDL Cholesterol 45 0 - 99 mg/dL   Total CHOL/HDL Ratio 3    NonHDL 61.54    *Note: Due to a large number of results and/or encounters for the requested time period, some results have not been displayed. A complete set of results can be found in Results Review.   Lab Results  Component Value Date   WBC 9.1 01/19/2024   HGB 15.0 01/19/2024   HCT 45.9 01/19/2024   MCV 97.7 01/19/2024   PLT 288 01/19/2024    Assessment & Plan:   Problem List Items Addressed This Visit     Essential hypertension (Chronic)   Chronic, stable on current regimen - continue.       Relevant Medications   tadalafil  (CIALIS ) 20 MG tablet   Healthcare maintenance - Primary (Chronic)   Preventative protocols reviewed and updated unless pt declined. Discussed healthy diet and lifestyle.        Hyperlipidemia LDL goal <70 (Chronic)   Great control on atorvastatin , zetia , vascepa  - continue this.  The ASCVD Risk score (Arnett DK, et al., 2019) failed to calculate for the following reasons:   Risk score cannot be calculated because patient has a medical history suggesting prior/existing ASCVD       Relevant Medications   tadalafil  (CIALIS ) 20 MG tablet   HFrEF (heart failure with reduced ejection fraction) (HCC) (Chronic)   Appreciate cardiology care.       Relevant Medications   tadalafil  (CIALIS ) 20 MG tablet   CAD (coronary artery disease) (Chronic)   Appreciate cardiology care.       Relevant Medications   tadalafil  (CIALIS ) 20 MG tablet   Crohn disease (HCC)   Continue azathioprine  and Entyvio  along with prednisone .      Benign prostatic hyperplasia  Seeing urology.       Eosinophilic esophagitis   Continue pantoprazole  40mg  daily.       Relevant Medications   pantoprazole  (PROTONIX ) 40 MG tablet   Osteoporosis   On fosamax  through rheumatology. Last DEXA in chart is 2019. Will order updated DEXA.       Relevant Medications   Cholecalciferol  (VITAMIN D3) 25 MCG (1000 UT) CAPS   Other Relevant Orders   DG Bone Density   Churg-Strauss syndrome with lung involvement [facility])   Appreciate specialty care. Rheumatology is managing prednisone  GI is managing azathioprine  and Entyvio .      Relevant Medications   tadalafil  (CIALIS ) 20 MG tablet   Prostate cancer [facility])   Regularly seeing urology q6 months.       Insomnia   Stable period on amitriptyline  25mg  nightly - continue this.       Relevant Medications   amitriptyline  (ELAVIL ) 25 MG tablet   Erectile dysfunction   Continue cialis .       On prednisone  therapy   Other Visit Diagnoses       Need for vaccination against Streptococcus pneumoniae       Relevant Orders   Pneumococcal conjugate vaccine 20-valent (Prevnar 20) (Completed)     Encounter for immunization       Relevant Orders    Flu vaccine trivalent PF, 6mos and older(Flulaval,Afluria,Fluarix,Fluzone) (Completed)        Meds ordered this encounter  Medications   Cholecalciferol  (VITAMIN D3) 25 MCG (1000 UT) CAPS    Sig: Take 1 capsule (1,000 Units total) by mouth daily.   amitriptyline  (ELAVIL ) 25 MG tablet    Sig: Take 1 tablet (25 mg total) by mouth at bedtime as needed for sleep.    Dispense:  90 tablet    Refill:  3   pantoprazole  (PROTONIX ) 40 MG tablet    Sig: Take 1 tablet (40 mg total) by mouth daily.    Dispense:  90 tablet    Refill:  3   tadalafil  (CIALIS ) 20 MG tablet    Sig: Take 0.5-1 tablets (10-20 mg total) by mouth daily as needed for erectile dysfunction.    Dispense:  20 tablet    Refill:  5    Orders Placed This Encounter  Procedures   DG Bone Density    Standing Status:   Future    Expiration Date:   03/02/2025    Reason for Exam (SYMPTOM  OR DIAGNOSIS REQUIRED):   osteoporosis f/u    Preferred imaging location?:   [place]   Pneumococcal conjugate vaccine 20-valent (Prevnar 20)   Flu vaccine trivalent PF, 6mos and older(Flulaval,Afluria,Fluarix,Fluzone)    Patient Instructions  Flu shot and prevnar-20 pneumonia shots today Call to to schedule bone density scan: [place] [facility] [phone]  Start vitamin D3 1000-2000 units daily OTC  You are doing well today Return as needed or in 1 year for next physical   Follow up plan: Return in about 1 year (around 03/02/2025) for annual exam, prior fasting for blood work.  Anton Blas, MD

## 2024-03-02 NOTE — Assessment & Plan Note (Signed)
 Continue azathioprine  and Entyvio  along with prednisone .

## 2024-03-02 NOTE — Assessment & Plan Note (Signed)
 Appreciate specialty care. Rheumatology is managing prednisone  GI is managing azathioprine  and Entyvio .

## 2024-03-02 NOTE — Patient Instructions (Addendum)
 Flu shot and prevnar-20 pneumonia shots today Call to to schedule bone density scan: [name] Bone Density [phone]  Start vitamin D3 1000-2000 units daily OTC  You are doing well today Return as needed or in 1 year for next physical

## 2024-03-02 NOTE — Assessment & Plan Note (Addendum)
 On fosamax  through rheumatology. Last DEXA in chart is 2019. Will order updated DEXA.

## 2024-03-02 NOTE — Assessment & Plan Note (Signed)
 Continue pantoprazole  40 mg daily

## 2024-03-02 NOTE — Assessment & Plan Note (Addendum)
 Great control on atorvastatin , zetia , vascepa  - continue this.  The ASCVD Risk score (Arnett DK, et al., 2019) failed to calculate for the following reasons:   Risk score cannot be calculated because patient has a medical history suggesting prior/existing ASCVD

## 2024-03-02 NOTE — Assessment & Plan Note (Signed)
 Regularly seeing urology q6 months.

## 2024-03-02 NOTE — Assessment & Plan Note (Signed)
 Seeing urology.

## 2024-03-02 NOTE — Assessment & Plan Note (Signed)
 Stable period on amitriptyline  25mg  nightly - continue this.

## 2024-03-02 NOTE — Assessment & Plan Note (Signed)
Continue cialis

## 2024-03-03 ENCOUNTER — Other Ambulatory Visit (HOSPITAL_COMMUNITY): Payer: Self-pay

## 2024-03-03 MED ORDER — SILODOSIN 8 MG PO CAPS
8.0000 mg | ORAL_CAPSULE | Freq: Every day | ORAL | 3 refills | Status: DC
  Filled 2024-03-03: qty 1, 1d supply, fill #0

## 2024-03-09 ENCOUNTER — Other Ambulatory Visit: Payer: Self-pay

## 2024-03-15 ENCOUNTER — Inpatient Hospital Stay: Admission: RE | Admit: 2024-03-15

## 2024-03-17 ENCOUNTER — Other Ambulatory Visit (HOSPITAL_COMMUNITY): Payer: Self-pay

## 2024-03-17 ENCOUNTER — Other Ambulatory Visit: Payer: Self-pay

## 2024-03-17 MED ORDER — SILODOSIN 8 MG PO CAPS
8.0000 mg | ORAL_CAPSULE | Freq: Every day | ORAL | 3 refills | Status: DC
  Filled 2024-03-17: qty 1, 1d supply, fill #0

## 2024-03-17 MED ORDER — SILODOSIN 8 MG PO CAPS
8.0000 mg | ORAL_CAPSULE | Freq: Every day | ORAL | 3 refills | Status: AC
  Filled 2024-03-17: qty 90, 90d supply, fill #0
  Filled 2024-03-17: qty 30, 30d supply, fill #0
  Filled 2024-04-09 – 2024-04-11 (×3): qty 30, 30d supply, fill #1

## 2024-03-18 ENCOUNTER — Inpatient Hospital Stay: Admission: RE | Admit: 2024-03-18 | Discharge: 2024-03-18 | Attending: Family Medicine | Admitting: Family Medicine

## 2024-03-18 DIAGNOSIS — M818 Other osteoporosis without current pathological fracture: Secondary | ICD-10-CM | POA: Diagnosis not present

## 2024-03-21 ENCOUNTER — Ambulatory Visit (INDEPENDENT_AMBULATORY_CARE_PROVIDER_SITE_OTHER): Payer: BC Managed Care – PPO

## 2024-03-21 DIAGNOSIS — I5042 Chronic combined systolic (congestive) and diastolic (congestive) heart failure: Secondary | ICD-10-CM | POA: Diagnosis not present

## 2024-03-22 ENCOUNTER — Other Ambulatory Visit (HOSPITAL_COMMUNITY): Payer: Self-pay

## 2024-03-23 ENCOUNTER — Other Ambulatory Visit (HOSPITAL_COMMUNITY): Payer: Self-pay

## 2024-03-25 ENCOUNTER — Ambulatory Visit

## 2024-03-25 LAB — CUP PACEART REMOTE DEVICE CHECK
Battery Remaining Longevity: 150 mo
Battery Voltage: 3.01 V
Brady Statistic RV Percent Paced: 0.01 %
Date Time Interrogation Session: 20251225005032
HighPow Impedance: 64 Ohm
Implantable Lead Connection Status: 753985
Implantable Lead Implant Date: 20240325
Implantable Lead Location: 753860
Implantable Lead Model: 6935
Implantable Pulse Generator Implant Date: 20240325
Lead Channel Impedance Value: 456 Ohm
Lead Channel Impedance Value: 551 Ohm
Lead Channel Pacing Threshold Amplitude: 0.625 V
Lead Channel Pacing Threshold Pulse Width: 0.4 ms
Lead Channel Sensing Intrinsic Amplitude: 23.9 mV
Lead Channel Setting Pacing Amplitude: 2 V
Lead Channel Setting Pacing Pulse Width: 0.4 ms
Lead Channel Setting Sensing Sensitivity: 0.3 mV
Zone Setting Status: 755011

## 2024-03-25 NOTE — Progress Notes (Signed)
 Remote ICD Transmission

## 2024-03-27 ENCOUNTER — Ambulatory Visit: Payer: Self-pay | Admitting: Cardiology

## 2024-03-28 ENCOUNTER — Ambulatory Visit: Payer: Self-pay | Admitting: Family Medicine

## 2024-03-28 DIAGNOSIS — M818 Other osteoporosis without current pathological fracture: Secondary | ICD-10-CM

## 2024-04-05 ENCOUNTER — Other Ambulatory Visit (HOSPITAL_COMMUNITY): Payer: Self-pay

## 2024-04-06 ENCOUNTER — Other Ambulatory Visit: Payer: Self-pay

## 2024-04-07 ENCOUNTER — Other Ambulatory Visit: Payer: Self-pay

## 2024-04-09 ENCOUNTER — Other Ambulatory Visit (HOSPITAL_COMMUNITY): Payer: Self-pay

## 2024-04-11 ENCOUNTER — Other Ambulatory Visit (HOSPITAL_COMMUNITY): Payer: Self-pay

## 2024-04-11 ENCOUNTER — Other Ambulatory Visit: Payer: Self-pay

## 2024-04-11 ENCOUNTER — Other Ambulatory Visit: Payer: Self-pay | Admitting: Internal Medicine

## 2024-04-11 DIAGNOSIS — M301 Polyarteritis with lung involvement [Churg-Strauss]: Secondary | ICD-10-CM

## 2024-04-11 DIAGNOSIS — D72119 Hypereosinophilic syndrome (hes), unspecified: Secondary | ICD-10-CM

## 2024-04-11 MED ORDER — PREDNISONE 1 MG PO TABS
4.0000 mg | ORAL_TABLET | Freq: Every day | ORAL | 0 refills | Status: AC
  Filled 2024-04-11: qty 360, 90d supply, fill #0

## 2024-04-11 MED ORDER — PREDNISONE 10 MG PO TABS
10.0000 mg | ORAL_TABLET | Freq: Every day | ORAL | 0 refills | Status: AC
  Filled 2024-04-28: qty 90, 90d supply, fill #0

## 2024-04-11 NOTE — Telephone Encounter (Signed)
 Last Fill: 01/19/2024  Next Visit: 05/23/2024  Last Visit: 01/19/2024  Dx: Churg-Strauss syndrome with lung involvement   Current Dose per office note on 01/19/2024: prednisone  at 13 mg daily for now, can increase back to 14 if experiences additional respiratory symptoms   Okay to refill Prednisone ?

## 2024-04-12 ENCOUNTER — Other Ambulatory Visit (HOSPITAL_COMMUNITY): Payer: Self-pay

## 2024-04-13 ENCOUNTER — Other Ambulatory Visit: Payer: Self-pay | Admitting: Internal Medicine

## 2024-04-13 DIAGNOSIS — Z20828 Contact with and (suspected) exposure to other viral communicable diseases: Secondary | ICD-10-CM

## 2024-04-13 MED ORDER — OSELTAMIVIR PHOSPHATE 75 MG PO CAPS: 75.0000 mg | ORAL_CAPSULE | Freq: Every day | ORAL | 0 refills | Status: AC

## 2024-04-13 NOTE — Progress Notes (Signed)
 Influenza exposure, high risk on immunosuppression  Tamiflu  75 mg daily x 10 days

## 2024-04-22 ENCOUNTER — Other Ambulatory Visit (HOSPITAL_COMMUNITY): Payer: Self-pay

## 2024-04-28 ENCOUNTER — Other Ambulatory Visit: Payer: Self-pay

## 2024-04-29 ENCOUNTER — Other Ambulatory Visit (HOSPITAL_COMMUNITY): Payer: Self-pay

## 2024-04-29 ENCOUNTER — Other Ambulatory Visit: Payer: Self-pay

## 2024-05-02 ENCOUNTER — Other Ambulatory Visit (HOSPITAL_COMMUNITY): Payer: Self-pay

## 2024-05-03 ENCOUNTER — Other Ambulatory Visit: Payer: Self-pay

## 2024-05-03 ENCOUNTER — Encounter: Payer: Self-pay | Admitting: Pharmacy Technician

## 2024-05-04 ENCOUNTER — Other Ambulatory Visit (HOSPITAL_COMMUNITY): Payer: Self-pay

## 2024-05-04 ENCOUNTER — Encounter (HOSPITAL_COMMUNITY): Payer: Self-pay | Admitting: Pharmacist

## 2024-05-05 ENCOUNTER — Other Ambulatory Visit: Payer: Self-pay

## 2024-05-23 ENCOUNTER — Ambulatory Visit: Admitting: Internal Medicine
